# Patient Record
Sex: Male | Born: 1950
Health system: Southern US, Community
[De-identification: ages and names within clinical notes are randomized; demographics above are authoritative.]

## PROBLEM LIST (undated history)

## (undated) DIAGNOSIS — Z5189 Encounter for other specified aftercare: Secondary | ICD-10-CM

## (undated) DIAGNOSIS — K219 Gastro-esophageal reflux disease without esophagitis: Secondary | ICD-10-CM

## (undated) DIAGNOSIS — F32A Depression, unspecified: Secondary | ICD-10-CM

## (undated) DIAGNOSIS — F329 Major depressive disorder, single episode, unspecified: Secondary | ICD-10-CM

## (undated) DIAGNOSIS — E119 Type 2 diabetes mellitus without complications: Secondary | ICD-10-CM

## (undated) DIAGNOSIS — T7840XA Allergy, unspecified, initial encounter: Secondary | ICD-10-CM

## (undated) HISTORY — PX: HERNIA REPAIR: SHX51

## (undated) HISTORY — PX: INGUINAL HERNIA REPAIR: SHX194

## (undated) HISTORY — DX: Type 2 diabetes mellitus without complications: E11.9

## (undated) HISTORY — DX: Depression, unspecified: F32.A

## (undated) HISTORY — DX: Encounter for other specified aftercare: Z51.89

## (undated) HISTORY — DX: Gastro-esophageal reflux disease without esophagitis: K21.9

## (undated) HISTORY — PX: BRAIN SURGERY: SHX531

## (undated) HISTORY — PX: TONSILLECTOMY: SUR1361

## (undated) HISTORY — DX: Allergy, unspecified, initial encounter: T78.40XA

## (undated) HISTORY — DX: Major depressive disorder, single episode, unspecified: F32.9

## (undated) HISTORY — PX: APPENDECTOMY: SHX54

---

## 1988-10-23 HISTORY — PX: HERNIA REPAIR: SHX51

## 1990-10-23 DIAGNOSIS — I671 Cerebral aneurysm, nonruptured: Secondary | ICD-10-CM

## 1990-10-23 HISTORY — PX: CEREBRAL ANEURYSM REPAIR: SHX164

## 1990-10-23 HISTORY — DX: Cerebral aneurysm, nonruptured: I67.1

## 2003-10-24 HISTORY — PX: TUMOR EXCISION: SHX421

## 2004-07-13 ENCOUNTER — Encounter: Admission: RE | Admit: 2004-07-13 | Discharge: 2004-07-13 | Payer: Self-pay | Admitting: Neurology

## 2004-07-18 ENCOUNTER — Ambulatory Visit (HOSPITAL_COMMUNITY): Admission: RE | Admit: 2004-07-18 | Discharge: 2004-07-18 | Payer: Self-pay | Admitting: Neurology

## 2004-07-25 ENCOUNTER — Ambulatory Visit (HOSPITAL_COMMUNITY): Admission: RE | Admit: 2004-07-25 | Discharge: 2004-07-25 | Payer: Self-pay | Admitting: Neurology

## 2004-07-29 ENCOUNTER — Inpatient Hospital Stay (HOSPITAL_COMMUNITY): Admission: RE | Admit: 2004-07-29 | Discharge: 2004-08-01 | Payer: Self-pay | Admitting: Neurosurgery

## 2018-10-28 ENCOUNTER — Encounter: Payer: Self-pay | Admitting: Family Medicine

## 2018-10-28 ENCOUNTER — Ambulatory Visit (INDEPENDENT_AMBULATORY_CARE_PROVIDER_SITE_OTHER): Payer: Medicare HMO | Admitting: Family Medicine

## 2018-10-28 VITALS — BP 140/84 | HR 83 | Temp 98.3°F | Ht 72.0 in | Wt 254.5 lb

## 2018-10-28 DIAGNOSIS — F32A Depression, unspecified: Secondary | ICD-10-CM | POA: Insufficient documentation

## 2018-10-28 DIAGNOSIS — E669 Obesity, unspecified: Secondary | ICD-10-CM | POA: Diagnosis not present

## 2018-10-28 DIAGNOSIS — Z833 Family history of diabetes mellitus: Secondary | ICD-10-CM | POA: Diagnosis not present

## 2018-10-28 DIAGNOSIS — R030 Elevated blood-pressure reading, without diagnosis of hypertension: Secondary | ICD-10-CM | POA: Diagnosis not present

## 2018-10-28 DIAGNOSIS — F341 Dysthymic disorder: Secondary | ICD-10-CM

## 2018-10-28 DIAGNOSIS — Z7689 Persons encountering health services in other specified circumstances: Secondary | ICD-10-CM

## 2018-10-28 DIAGNOSIS — F329 Major depressive disorder, single episode, unspecified: Secondary | ICD-10-CM | POA: Insufficient documentation

## 2018-10-28 MED ORDER — ESCITALOPRAM OXALATE 20 MG PO TABS: 20.0000 mg | ORAL_TABLET | Freq: Every day | ORAL | 0 refills | Status: DC

## 2018-10-28 NOTE — Progress Notes (Signed)
New patient office visit note:    Impression and Recommendations:    1. Encounter to establish care with new doctor   2. Dysthymia   3. (BMI 30.0-34.9)   4. Elevated blood pressure reading- no dx HTN   5. Family history of diabetes mellitus (DM)     - Patient knows to call and let us know if he remembers his smoking history/tobacco use differently.  - Per patient, feels that he last had blood work a year ago.  1. Establishing as a New Patient - Extensive discussion held with patient regarding establishing as a new patient.  Discussed policies and practices here at the clinic, and answered all questions about care team and health management during appointment.  - Discussed need for patient to continue to obtain maintenance and screenings with her established specialists.  Educated patient at length about the critical importance of keeping healthcare up to date.  - Need for fasting lab work in near future.  2. Blood Pressure - Ongoing ambulatory blood pressure monitoring encouraged. - Will continue to monitor.   Education and routine counseling performed. Handouts provided.  3. BMI Counseling - BMI of 34.52 Explained to patient what BMI refers to, and what it means medically.    Told patient to think about it as a "medical risk stratification measurement" and how increasing BMI is associated with increasing risk/ or worsening state of various diseases such as hypertension, hyperlipidemia, diabetes, premature OA, depression etc.  American Heart Association guidelines for healthy diet, basically Mediterranean diet, and exercise guidelines of 30 minutes 5 days per week or more discussed in detail.  Health counseling performed.  All questions answered.  4. Lifestyle & Preventative Health Maintenance - Advised patient to continue working toward exercising to improve overall mental, physical, and emotional health.    - Reviewed the "spokes of the wheel" of mood and health  management.  Stressed the importance of ongoing prudent habits, including regular exercise, appropriate sleep hygiene, healthful dietary habits, and prayer/meditation to relax.  - Encouraged patient to engage in daily physical activity, especially a formal exercise routine.  Recommended that the patient eventually strive for at least 150 minutes of moderate cardiovascular activity per week according to guidelines established by the [facility].   - Healthy dietary habits encouraged, including low-carb, and high amounts of lean protein in diet.   - Patient should also consume adequate amounts of water.    Do not remove these below:  Orders Placed This Encounter  Procedures  . CBC with Differential/Platelet  . Comprehensive metabolic panel  . Hemoglobin A1c  . Lipid panel  . T4, free  . TSH  . VITAMIN D 25 Hydroxy (Vit-D Deficiency, Fractures)    Meds ordered this encounter  Medications  . escitalopram (LEXAPRO) 20 MG tablet    Sig: Take 1 tablet (20 mg total) by mouth daily.    Dispense:  90 tablet    Refill:  0    Medications Discontinued During This Encounter  Medication Reason  . escitalopram (LEXAPRO) 20 MG tablet Reorder    Gross side effects, risk and benefits, and alternatives of medications discussed with patient.  Patient is aware that all medications have potential side effects and we are unable to predict every side effect or drug-drug interaction that may occur.  Expresses verbal understanding and consents to current therapy plan and treatment regimen.  Return for CPE in less than 90d.  Please see AVS handed out to patient at  the end of our visit for further patient instructions/ counseling done pertaining to today's office visit.    Note:  This document was prepared using Dragon voice recognition software and may include unintentional dictation errors.   This document serves as a record of services personally performed by Mellody Dance, DO. It was created on her  behalf by Toni Amend, a trained medical scribe. The creation of this record is based on the scribe's personal observations and the provider's statements to them.   I have reviewed the above medical documentation for accuracy and completeness and I concur.  Mellody Dance, DO 10/30/2018 7:32 PM     ---------------------------------------------------------------------------------------------------------------    Subjective:    Chief complaint:   Chief Complaint  Patient presents with  . Establish Care     HPI: Patrick Buchanan is a pleasant 68 y.o. male who presents to [facility] today to review their medical history with me and establish care.   I asked the patient to review their chronic problem list with me to ensure everything was updated and accurate.    All recent office visits with other providers, any medical records that patient brought in etc  - I reviewed today.     We asked pt to get [place] their medical records from [facility] providers/ specialists that they had seen within the past 3-5 years- if they are in private practice and/or do not work for [employer], [facility], [place], [place] or [employer] owned practice.  Told them to call their specialists to clarify this if they are not sure.   Formerly saw Dr. Rex Kras at [facility]. Got tired of driving across town to visit the doctor.  Social History Has lived out here since 25.  Married since 1982. Has three daughters. Will have 6 grandkids in April.  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 Retired at age 62.  Tobacco Use Former smoker; quit around 10/24/2003. Smoked for "a good long time." Started smoking after law school, at around age 78. Didn't keep track of how much he smoked. About a pack per day.  Smoked at an estimated 20 pack-year history. States "if you want to screen it, I don't  care."  Alcohol Use In the cool weather, he drinks more. These days drinks bourbon; 2 drinks per day.  In warm weather, he doesn't really drink. "You couldn't pay me to drink a beer."  Caffeine Use  3 cups of coffee, tea, soda per day.  Exercise Habits Does not exercise regularly.  Family History Mom with breast cancer. Brother with diabetes.  Past Medical History Denies history of lab abnormalities.  Can't remember when he last had his blood work done.  - History of Treatment through Neuro - Aneurysm, Subdural Tumor Patient states that Dr. Sherwood Gambler has "saved me twice." Had an aneurysm fixed and a "subdural tumor" taken out by Dr. Sherwood Gambler.  - Mood Management He takes 20 mg of Lexapro daily, "that's been a good change in my life." He's been taking this for about 3-4 years. Denies anxiety.  "I'm born to be calm." Says "the world got to looking a little bit non-technicolor for a while." States "I was depressed."  Sees no other specialists.  Says he has no aches or pains.  - Inguinal Hernia Repair Has had a hernia; more than one hernia. Had an inguinal hernia repaired.  - Blood Pressure  Checks his blood pressure periodically at home. Notes that it runs within normal limits at home.    Wt Readings from Last 3 Encounters:  10/28/18 254 lb 8 oz (115.4 kg)   BP Readings from Last 3 Encounters:  10/28/18 140/84   Pulse Readings from Last 3 Encounters:  10/28/18 83   BMI Readings from Last 3 Encounters:  10/28/18 34.52 kg/m    Patient Care Team    Relationship Specialty Notifications Start End  Mellody Dance, DO PCP - General Family Medicine  10/28/18     Patient Active Problem List   Diagnosis Date Noted  . dysthymia 10/28/2018  . (BMI 30.0-34.9) 10/28/2018  . Elevated blood pressure reading- no dx HTN 10/28/2018  . Family history of diabetes mellitus (DM) 10/28/2018       As reported by pt:  Past Medical History:  Diagnosis Date  .  Depression      Past Surgical History:  Procedure Laterality Date  . CEREBRAL ANEURYSM REPAIR  1992  . TUMOR EXCISION  2005   benign cervical     Family History  Problem Relation Age of Onset  . Breast cancer Mother   . Diabetes Brother      Social History   Substance and Sexual Activity  Drug Use Never     Social History   Substance and Sexual Activity  Alcohol Use Yes     Social History   Tobacco Use  Smoking Status Former Smoker  . Years: 10.00  . Types: Cigarettes  . Last attempt to quit: 2005  . Years since quitting: 15.0  Smokeless Tobacco Never Used     Current Meds  Medication Sig  . escitalopram (LEXAPRO) 20 MG tablet Take 1 tablet (20 mg total) by mouth daily.  . [DISCONTINUED] escitalopram (LEXAPRO) 20 MG tablet Take 20 mg by mouth daily.    Allergies: Patient has no known allergies.   Review of Systems  Constitutional: Negative for chills, diaphoresis, fever, malaise/fatigue and weight loss.  HENT: Negative for congestion, sore throat and tinnitus.   Eyes: Negative for blurred vision, double vision and photophobia.  Respiratory: Negative for cough and wheezing.   Cardiovascular: Negative for chest pain and palpitations.  Gastrointestinal: Negative for blood in stool, diarrhea, nausea and vomiting.  Genitourinary: Negative for dysuria, frequency and urgency.  Musculoskeletal: Negative for joint pain and myalgias.  Skin: Negative for itching and rash.  Neurological: Negative for dizziness, focal weakness, weakness and headaches.  Endo/Heme/Allergies: Negative for environmental allergies and polydipsia. Does not bruise/bleed easily.  Psychiatric/Behavioral: Negative for depression and memory loss. The patient is not nervous/anxious and does not have insomnia.         Objective:   Blood pressure 140/84, pulse 83, temperature 98.3 F (36.8 C), height 6' (1.829 m), weight 254 lb 8 oz (115.4 kg), SpO2 98 %. Body mass index is 34.52  kg/m. General: Well Developed, well nourished, and in no acute distress.  Neuro: Alert and oriented x3, extra-ocular muscles intact, sensation grossly intact.  HEENT:Bell/AT, PERRLA, neck supple, No carotid bruits Skin: no gross rashes  Cardiac: Regular rate and rhythm Respiratory: Essentially clear to auscultation bilaterally. Not using accessory muscles, speaking in full sentences.  Abdominal: not grossly distended Musculoskeletal: Ambulates w/o diff, FROM * 4 ext.  Vasc: less 2 sec cap RF, warm and pink  Psych:  No HI/SI, judgement and insight good, Euthymic mood. Full Affect.    No results found for this or any previous visit (from the past  2160 hour(s)).

## 2018-10-28 NOTE — Patient Instructions (Signed)
   Please realize, EXERCISE IS MEDICINE!  -  American Heart Association ( AHA) guidelines for exercise : If you are in good health, without any medical conditions, you should engage in 150-300 minutes of moderate intensity aerobic activity per week.  This means you should be huffing and puffing throughout your workout.   Engaging in regular exercise will improve brain function and memory, as well as improve mood, boost immune system and help with weight management.  As well as the other, more well-known effects of exercise such as decreasing blood sugar levels, decreasing blood pressure,  and decreasing bad cholesterol levels/ increasing good cholesterol levels.     -  The AHA strongly endorses consumption of a diet that contains a variety of foods from all the food categories with an emphasis on fruits and vegetables; fat-free and low-fat dairy products; cereal and grain products; legumes and nuts; and fish, poultry, and/or extra lean meats.    Excessive food intake, especially of foods high in saturated and trans fats, sugar, and salt, should be avoided.    Adequate water intake of roughly 1/2 of your weight in pounds, should equal the ounces of water per day you should drink.  So for instance, if you're 200 pounds, that would be 100 ounces of water per day.         Mediterranean Diet  Why follow it? Research shows. . Those who follow the Mediterranean diet have a reduced risk of heart disease  . The diet is associated with a reduced incidence of Parkinson's and Alzheimer's diseases . People following the diet may have longer life expectancies and lower rates of chronic diseases  . The Dietary Guidelines for Americans recommends the Mediterranean diet as an eating plan to promote health and prevent disease  What Is the Mediterranean Diet?  . Healthy eating plan based on typical foods and recipes of Mediterranean-style cooking . The diet is primarily a plant based diet; these foods should  make up a majority of meals   Starches - Plant based foods should make up a majority of meals - They are an important sources of vitamins, minerals, energy, antioxidants, and fiber - Choose whole grains, foods high in fiber and minimally processed items  - Typical grain sources include wheat, oats, barley, corn, brown rice, bulgar, farro, millet, polenta, couscous  - Various types of beans include chickpeas, lentils, fava beans, black beans, white beans   Fruits  Veggies - Large quantities of antioxidant rich fruits & veggies; 6 or more servings  - Vegetables can be eaten raw or lightly drizzled with oil and cooked  - Vegetables common to the traditional Mediterranean Diet include: artichokes, arugula, beets, broccoli, brussel sprouts, cabbage, carrots, celery, collard greens, cucumbers, eggplant, kale, leeks, lemons, lettuce, mushrooms, okra, onions, peas, peppers, potatoes, pumpkin, radishes, rutabaga, shallots, spinach, sweet potatoes, turnips, zucchini - Fruits common to the Mediterranean Diet include: apples, apricots, avocados, cherries, clementines, dates, figs, grapefruits, grapes, melons, nectarines, oranges, peaches, pears, pomegranates, strawberries, tangerines  Fats - Replace butter and margarine with healthy oils, such as olive oil, canola oil, and tahini  - Limit nuts to no more than a handful a day  - Nuts include walnuts, almonds, pecans, pistachios, pine nuts  - Limit or avoid candied, honey roasted or heavily salted nuts - Olives are central to the Mediterranean diet - can be eaten whole or used in a variety of dishes   Meats Protein - Limiting red meat: no more than a few   times a month - When eating red meat: choose lean cuts and keep the portion to the size of deck of cards - Eggs: approx. 0 to 4 times a week  - Fish and lean poultry: at least 2 a week  - Healthy protein sources include, chicken, turkey, lean beef, lamb - Increase intake of seafood such as tuna, salmon,  trout, mackerel, shrimp, scallops - Avoid or limit high fat processed meats such as sausage and bacon  Dairy - Include moderate amounts of low fat dairy products  - Focus on healthy dairy such as fat free yogurt, skim milk, low or reduced fat cheese - Limit dairy products higher in fat such as whole or 2% milk, cheese, ice cream  Alcohol - Moderate amounts of red wine is ok  - No more than 5 oz daily for women (all ages) and men older than age 65  - No more than 10 oz of wine daily for men younger than 65  Other - Limit sweets and other desserts  - Use herbs and spices instead of salt to flavor foods  - Herbs and spices common to the traditional Mediterranean Diet include: basil, bay leaves, chives, cloves, cumin, fennel, garlic, lavender, marjoram, mint, oregano, parsley, pepper, rosemary, sage, savory, sumac, tarragon, thyme   It's not just a diet, it's a lifestyle:  . The Mediterranean diet includes lifestyle factors typical of those in the region  . Foods, drinks and meals are best eaten with others and savored . Daily physical activity is important for overall good health . This could be strenuous exercise like running and aerobics . This could also be more leisurely activities such as walking, housework, yard-work, or taking the stairs . Moderation is the key; a balanced and healthy diet accommodates most foods and drinks . Consider portion sizes and frequency of consumption of certain foods   Meal Ideas & Options:  . Breakfast:  o Whole wheat toast or whole wheat English muffins with peanut butter & hard boiled egg o Steel cut oats topped with apples & cinnamon and skim milk  o Fresh fruit: banana, strawberries, melon, berries, peaches  o Smoothies: strawberries, bananas, greek yogurt, peanut butter o Low fat greek yogurt with blueberries and granola  o Egg white omelet with spinach and mushrooms o Breakfast couscous: whole wheat couscous, apricots, skim milk, cranberries   . Sandwiches:  o Hummus and grilled vegetables (peppers, zucchini, squash) on whole wheat bread   o Grilled chicken on whole wheat pita with lettuce, tomatoes, cucumbers or tzatziki  o Tuna salad on whole wheat bread: tuna salad made with greek yogurt, olives, red peppers, capers, green onions o Garlic rosemary lamb pita: lamb sauted with garlic, rosemary, salt & pepper; add lettuce, cucumber, greek yogurt to pita - flavor with lemon juice and black pepper  . Seafood:  o Mediterranean grilled salmon, seasoned with garlic, basil, parsley, lemon juice and black pepper o Shrimp, lemon, and spinach whole-grain pasta salad made with low fat greek yogurt  o Seared scallops with lemon orzo  o Seared tuna steaks seasoned salt, pepper, coriander topped with tomato mixture of olives, tomatoes, olive oil, minced garlic, parsley, green onions and cappers  . Meats:  o Herbed greek chicken salad with kalamata olives, cucumber, feta  o Red bell peppers stuffed with spinach, bulgur, lean ground beef (or lentils) & topped with feta   o Kebabs: skewers of chicken, tomatoes, onions, zucchini, squash  o Turkey burgers: made with red onions, mint,   dill, lemon juice, feta cheese topped with roasted red peppers . Vegetarian o Cucumber salad: cucumbers, artichoke hearts, celery, red onion, feta cheese, tossed in olive oil & lemon juice  o Hummus and whole grain pita points with a greek salad (lettuce, tomato, feta, olives, cucumbers, red onion) o Lentil soup with celery, carrots made with vegetable broth, garlic, salt and pepper  o Tabouli salad: parsley, bulgur, mint, scallions, cucumbers, tomato, radishes, lemon juice, olive oil, salt and pepper.  

## 2018-10-29 ENCOUNTER — Telehealth: Payer: Self-pay | Admitting: Family Medicine

## 2018-10-29 NOTE — Telephone Encounter (Signed)
Noted MPulliam, CMA/RT(R)  

## 2018-10-29 NOTE — Telephone Encounter (Signed)
Patient called states Dr. Raliegh Scarlet advised him to chk w/Ins Co regarding Medicare Wellness exam Vs. Physical/ CPE---Patient states his insurance covers Wellness exams, also he says Pneumonia & Shingrix vaccines are covered (advised him to see if the cv'd vaccine should be gvn @ Pharmacy or can be gvn @ Dr's office)-- Pt to call back after speaking with Ins Co for clarification.   --forwarding message to medical assistant as Juluis Rainier.  --glh

## 2018-11-28 ENCOUNTER — Other Ambulatory Visit (INDEPENDENT_AMBULATORY_CARE_PROVIDER_SITE_OTHER): Payer: Medicare HMO

## 2018-11-28 ENCOUNTER — Ambulatory Visit (INDEPENDENT_AMBULATORY_CARE_PROVIDER_SITE_OTHER): Payer: Medicare HMO | Admitting: Family Medicine

## 2018-11-28 ENCOUNTER — Encounter: Payer: Self-pay | Admitting: Family Medicine

## 2018-11-28 VITALS — BP 137/87 | HR 75 | Temp 98.2°F | Ht 71.0 in | Wt 256.7 lb

## 2018-11-28 DIAGNOSIS — F341 Dysthymic disorder: Secondary | ICD-10-CM

## 2018-11-28 DIAGNOSIS — R7309 Other abnormal glucose: Secondary | ICD-10-CM | POA: Diagnosis not present

## 2018-11-28 DIAGNOSIS — Z23 Encounter for immunization: Secondary | ICD-10-CM | POA: Diagnosis not present

## 2018-11-28 DIAGNOSIS — E559 Vitamin D deficiency, unspecified: Secondary | ICD-10-CM | POA: Diagnosis not present

## 2018-11-28 DIAGNOSIS — Z0000 Encounter for general adult medical examination without abnormal findings: Secondary | ICD-10-CM | POA: Diagnosis not present

## 2018-11-28 DIAGNOSIS — Z1211 Encounter for screening for malignant neoplasm of colon: Secondary | ICD-10-CM

## 2018-11-28 DIAGNOSIS — Z136 Encounter for screening for cardiovascular disorders: Secondary | ICD-10-CM

## 2018-11-28 MED ORDER — ZOSTER VAC RECOMB ADJUVANTED 50 MCG/0.5ML IM SUSR
0.5000 mL | Freq: Once | INTRAMUSCULAR | 0 refills | Status: AC
Start: 2018-11-28 — End: 2018-11-28

## 2018-11-28 MED ORDER — ESCITALOPRAM OXALATE 20 MG PO TABS: 20.0000 mg | ORAL_TABLET | Freq: Every day | ORAL | 0 refills | Status: DC

## 2018-11-28 MED ORDER — PNEUMOCOCCAL 13-VAL CONJ VACC IM SUSP: 0.5000 mL | INTRAMUSCULAR | 0 refills | Status: AC

## 2018-11-28 NOTE — Progress Notes (Signed)
Subjective:   Patrick Buchanan is a 68 y.o. male who presents for an Initial Medicare Annual Wellness Visit.  This is his initial medicare wellness visit.  I spent time with patient explaining what that means/ is and what it entails  - he is fasting and we will obtain yrly screening FBW per his request today   Health Maintenance  Topic Date Due  . Flu Shot  07/24/2019*  . Colon Cancer Screening  10/29/2019*  .  Hepatitis C: One time screening is recommended by Center for Disease Control  (CDC) for  adults born from [birth year] through 1963.   10/29/2019*  . HIV Screening  10/29/2019*  . Pneumonia vaccines (1 of 2 - PCV13) 10/29/2019*  . Tetanus Vaccine  10/26/2025  *Topic was postponed. The date shown is not the original due date.     Immunization History  Administered Date(s) Administered  . Tdap 10/27/2015      Objective:    Today's Vitals   11/28/18 1311  BP: 137/87  Pulse: 75  Temp: 98.2 F (36.8 C)  TempSrc: Oral  SpO2: 96%  Weight: 256 lb 11.2 oz (116.4 kg)  Height: 5\' 11"  (1.803 m)   Body mass index is 35.8 kg/m.  Advanced Directives 10/28/2018  Does Patient Have a Medical Advance Directive? Yes  Type of Advance Directive [place]  Does patient want to make changes to medical advance directive? No - Patient declined  Copy of [place] in Chart? No - copy requested    Current Medications (verified) Outpatient Encounter Medications as of 11/28/2018  Medication Sig  . escitalopram (LEXAPRO) 20 MG tablet Take 1 tablet (20 mg total) by mouth daily.  . [DISCONTINUED] escitalopram (LEXAPRO) 20 MG tablet Take 1 tablet (20 mg total) by mouth daily.  . [EXPIRED] pneumococcal 13-valent conjugate vaccine (PREVNAR 13) SUSP injection Inject 0.5 mLs into the muscle tomorrow at 10 am for 1 dose.  . [EXPIRED] Zoster Vaccine Adjuvanted [facility]) injection Inject 0.5 mLs into the muscle once for 1 dose.   No facility-administered  encounter medications on file as of 11/28/2018.     Allergies (verified) Patient has no known allergies.   History: Past Medical History:  Diagnosis Date  . Depression    Past Surgical History:  Procedure Laterality Date  . CEREBRAL ANEURYSM REPAIR  1992  . TUMOR EXCISION  2005   benign cervical   Family History  Problem Relation Age of Onset  . Breast cancer Mother   . Diabetes Brother    Social History   Socioeconomic History  . Marital status: Married    Spouse name: Not on file  . Number of children: Not on file  . Years of education: Not on file  . Highest education level: Not on file  Occupational History  . Not on file  Social Needs  . Financial resource strain: Not on file  . Food insecurity:    Worry: Not on file    Inability: Not on file  . Transportation needs:    Medical: Not on file    Non-medical: Not on file  Tobacco Use  . Smoking status: Former Smoker    Years: 10.00    Types: Cigarettes    Last attempt to quit: 2005    Years since quitting: 15.1  . Smokeless tobacco: Never Used  Substance and Sexual Activity  . Alcohol use: Yes  . Drug use: Never  . Sexual activity: Not Currently  Lifestyle  .  Physical activity:    Days per week: Not on file    Minutes per session: Not on file  . Stress: Not on file  Relationships  . Social connections:    Talks on phone: Not on file    Gets together: Not on file    Attends religious service: Not on file    Active member of club or organization: Not on file    Attends meetings of clubs or organizations: Not on file    Relationship status: Not on file  Other Topics Concern  . Not on file  Social History Narrative  . Not on file   Tobacco Counseling Counseling given: Not Answered   Activities of Daily Living In your present state of health, do you have any difficulty performing the following activities: 10/28/2018  Hearing? N  Vision? N  Difficulty concentrating or making decisions? N  Walking  or climbing stairs? N  Dressing or bathing? N  Doing errands, shopping? N  Some recent data might be hidden    Activities of Daily Living In your present state of health, do you have difficulty performing the following activities?  1- Driving - no 2- Managing money - no 3- Feeding yourself - no 4- Getting from the bed to the chair - no 5- Climbing a flight of stairs - no 6- Preparing food and eating - no 7- Bathing or showering - no 8- Getting dressed - no 9- Getting to the toilet - no 10- Using the toilet - no 11- Moving around from place to place - no  Patient states that he feels safe at home.    Immunizations and Health Maintenance Immunization History  Administered Date(s) Administered  . Tdap 10/27/2015    There are no preventive care reminders to display for this patient.   Patient Care Team: Mellody Dance, DO as PCP - General (Family Medicine)   Indicate any recent Medical Services you may have received from other than [facility] providers in the past year (date may be approximate).     Assessment:   This is a routine wellness examination for Kacy.  - EKG verbal order to CMA  Hearing/Vision screen - verbal order for CMA to obtain since pt did not have optometrist No exam data present  Dietary issues and exercise activities discussed: yes    Goals   None    Depression Screen PHQ 2/9 Scores 10/28/2018  PHQ - 2 Score 0  PHQ- 9 Score 0    Depression screen PHQ 2/9 10/28/2018  Decreased Interest 0  Down, Depressed, Hopeless 0  PHQ - 2 Score 0  Altered sleeping 0  Tired, decreased energy 0  Change in appetite 0  Feeling bad or failure about yourself  0  Trouble concentrating 0  Moving slowly or fidgety/restless 0  Suicidal thoughts 0  PHQ-9 Score 0  Difficult doing work/chores Not difficult at all    Fall Risk Fall Risk  10/28/2018  Falls in the past year? 0  Follow up Falls evaluation completed       Is the patient's home free of loose  throw rugs in walkways, pet beds, electrical cords, etc?   yes      Grab bars in the bathroom? no      Handrails on the stairs?   yes      Adequate lighting?   yes    Timed Get Up and Go performed: passed    Cognitive Function:    No flowsheet data found.  The 6CIT Dementia Test   How the test works Question Score range Weighting Weighted score        What Year is it 0-1 x4   What month is it 0-1 x3   Give the memory phrase e.g. (John/Smith/[address]/[place])     About what time is it 0-1 x3   Count back from 20-1 0-2 x2   Say months in reverse 0-2 x2   Repeat the memory phrase 0-5 x2   Total score for 6CIT 0-28         0-7 = normal - referral not necessary at present  8- 9 = mild cognitive impairment - probably refer  10-28 = significant cognitive impairment - refer      Screening Tests Health Maintenance  Topic Date Due  . INFLUENZA VACCINE  07/24/2019 (Originally 05/23/2018)  . COLONOSCOPY  10/29/2019 (Originally 05/02/2003)  . Hepatitis C Screening  10/29/2019 (Originally 1951/05/15)  . HIV Screening  10/29/2019 (Originally 05/01/1966)  . PNA vac Low Risk Adult (1 of 2 - PCV13) 10/29/2019 (Originally 05/01/2018)  . TETANUS/TDAP  10/26/2025    Qualifies for Shingles Vaccine? Sent to the pharmacy and patient given information.    Cancer Screenings: Lung: Low Dose CT Chest recommended if Age 70-80 years, 30 pack-year currently smoking OR have quit w/in 15years. Patient does not qualify.  Colorectal:  pt never had colonoscopy in past, and or he states he had one but there are no records of it.     He tells me he had it at [place] but there were "complications during the procedure " and per patient "they conveniently lost the records."    Thus, there is no documented previous colonoscopy results on this patient.    He thinks he had it around 63-57 yrs of age   Additional Screenings: Hepatitis C Screening: If patient agrees to it this will be performed in  addition to others that are recommended by CDC and USPSTF etc      Plan:     - verbal order to CMA to obtain EKG today for his initial Medicare wellness exam. - verbal order for AAA screen  (however, greater than 20 yrs ago quit with less than 30 pk yr hx) - verbal order for Shingrix, PNA vaccines to be sent to pharm - verbal order for GI referral for colonoscopy after d/c pt.    Orders Placed This Encounter  Procedures  . US [place] MEDICARE SCREENING  . Ambulatory referral to Gastroenterology    I have personally reviewed and noted the following in the patient's chart:   . Medical and social history . Use of alcohol, tobacco or illicit drugs  . Current medications and supplements . Functional ability and status . Nutritional status . Physical activity . Advanced directives . List of other physicians . Hospitalizations, surgeries, and ER visits in previous 12 months . Vitals . Screenings to include cognitive, depression, and falls . Referrals and appointments  In addition, I have reviewed and discussed with patient certain preventive protocols, quality metrics, and best practice recommendations. A written personalized care plan for preventive services as well as general preventive health recommendations were provided to patient.     Mellody Dance, DO   11/29/2018

## 2018-11-29 LAB — CBC WITH DIFFERENTIAL/PLATELET
Basophils Absolute: 0 10*3/uL (ref 0.0–0.2)
Basos: 1 %
EOS (ABSOLUTE): 0.1 10*3/uL (ref 0.0–0.4)
Eos: 3 %
Hematocrit: 41.4 % (ref 37.5–51.0)
Hemoglobin: 14.8 g/dL (ref 13.0–17.7)
Immature Grans (Abs): 0 10*3/uL (ref 0.0–0.1)
Immature Granulocytes: 1 %
Lymphocytes Absolute: 1.2 10*3/uL (ref 0.7–3.1)
Lymphs: 28 %
MCH: 32.1 pg (ref 26.6–33.0)
MCHC: 35.7 g/dL (ref 31.5–35.7)
MCV: 90 fL (ref 79–97)
Monocytes Absolute: 0.4 10*3/uL (ref 0.1–0.9)
Monocytes: 10 %
Neutrophils Absolute: 2.5 10*3/uL (ref 1.4–7.0)
Neutrophils: 57 %
Platelets: 230 10*3/uL (ref 150–450)
RBC: 4.61 x10E6/uL (ref 4.14–5.80)
RDW: 13 % (ref 11.6–15.4)
WBC: 4.4 10*3/uL (ref 3.4–10.8)

## 2018-11-29 LAB — COMPREHENSIVE METABOLIC PANEL
ALT: 25 IU/L (ref 0–44)
AST: 19 IU/L (ref 0–40)
Albumin/Globulin Ratio: 1.7 (ref 1.2–2.2)
Albumin: 4.2 g/dL (ref 3.8–4.8)
Alkaline Phosphatase: 93 IU/L (ref 39–117)
BUN/Creatinine Ratio: 20 (ref 10–24)
BUN: 19 mg/dL (ref 8–27)
Bilirubin Total: 0.5 mg/dL (ref 0.0–1.2)
CO2: 27 mmol/L (ref 20–29)
Calcium: 9 mg/dL (ref 8.6–10.2)
Chloride: 105 mmol/L (ref 96–106)
Creatinine, Ser: 0.93 mg/dL (ref 0.76–1.27)
GFR calc Af Amer: 99 mL/min/{1.73_m2} (ref >59)
GFR calc non Af Amer: 86 mL/min/{1.73_m2} (ref >59)
Globulin, Total: 2.5 g/dL (ref 1.5–4.5)
Glucose: 119 mg/dL — ABNORMAL HIGH (ref 65–99)
Potassium: 4.9 mmol/L (ref 3.5–5.2)
Sodium: 147 mmol/L — ABNORMAL HIGH (ref 134–144)
Total Protein: 6.7 g/dL (ref 6.0–8.5)

## 2018-11-29 LAB — LIPID PANEL
Chol/HDL Ratio: 4.4 ratio (ref 0.0–5.0)
Cholesterol, Total: 183 mg/dL (ref 100–199)
HDL: 42 mg/dL (ref >39)
LDL Calculated: 95 mg/dL (ref 0–99)
Triglycerides: 231 mg/dL — ABNORMAL HIGH (ref 0–149)
VLDL Cholesterol Cal: 46 mg/dL — ABNORMAL HIGH (ref 5–40)

## 2018-11-29 LAB — VITAMIN D 25 HYDROXY (VIT D DEFICIENCY, FRACTURES): Vit D, 25-Hydroxy: 22.2 ng/mL — ABNORMAL LOW (ref 30.0–100.0)

## 2018-11-29 LAB — HEMOGLOBIN A1C
Est. average glucose Bld gHb Est-mCnc: 120 mg/dL
Hgb A1c MFr Bld: 5.8 % — ABNORMAL HIGH (ref 4.8–5.6)

## 2018-11-29 LAB — T4, FREE: Free T4: 1 ng/dL (ref 0.82–1.77)

## 2018-11-29 LAB — TSH: TSH: 3.03 u[IU]/mL (ref 0.450–4.500)

## 2018-12-03 ENCOUNTER — Ambulatory Visit: Payer: Medicare HMO

## 2018-12-19 ENCOUNTER — Encounter: Payer: Self-pay | Admitting: Gastroenterology

## 2018-12-23 ENCOUNTER — Ambulatory Visit
Admission: RE | Admit: 2018-12-23 | Discharge: 2018-12-23 | Disposition: A | Discharge status: Home / Self Care | Payer: Medicare HMO | Source: Ambulatory Visit | Attending: Family Medicine | Admitting: Family Medicine

## 2018-12-23 DIAGNOSIS — Z87891 Personal history of nicotine dependence: Secondary | ICD-10-CM | POA: Diagnosis not present

## 2018-12-23 DIAGNOSIS — Z136 Encounter for screening for cardiovascular disorders: Secondary | ICD-10-CM

## 2019-01-01 ENCOUNTER — Encounter: Payer: Self-pay | Admitting: Gastroenterology

## 2019-01-01 ENCOUNTER — Encounter (INDEPENDENT_AMBULATORY_CARE_PROVIDER_SITE_OTHER): Payer: Self-pay

## 2019-01-01 ENCOUNTER — Other Ambulatory Visit: Payer: Self-pay

## 2019-01-01 ENCOUNTER — Ambulatory Visit (AMBULATORY_SURGERY_CENTER): Payer: Self-pay | Admitting: *Deleted

## 2019-01-01 VITALS — Ht 72.0 in | Wt 257.6 lb

## 2019-01-01 DIAGNOSIS — Z1211 Encounter for screening for malignant neoplasm of colon: Secondary | ICD-10-CM

## 2019-01-01 MED ORDER — NA SULFATE-K SULFATE-MG SULF 17.5-3.13-1.6 GM/177ML PO SOLN: 1.0000 | Freq: Once | ORAL | 0 refills | Status: AC

## 2019-01-01 NOTE — Progress Notes (Signed)
No egg or soy allergy known to patient  No issues with past sedation with any surgeries  or procedures, no intubation problems  No diet pills per patient No home 02 use per patient  No blood thinners per patient  Pt denies issues with constipation  No A fib or A flutter  EMMI video sent to pt's e mail  

## 2019-01-14 ENCOUNTER — Encounter: Payer: Medicare HMO | Admitting: Gastroenterology

## 2019-07-24 ENCOUNTER — Other Ambulatory Visit: Payer: Self-pay | Admitting: Family Medicine

## 2019-07-24 ENCOUNTER — Telehealth: Payer: Self-pay

## 2019-07-24 DIAGNOSIS — F341 Dysthymic disorder: Secondary | ICD-10-CM

## 2019-07-24 NOTE — Telephone Encounter (Signed)
Please call pt to schedule f/u.  No further refills until pt is seen.  T. Nelson, CMA 

## 2019-08-11 ENCOUNTER — Other Ambulatory Visit: Payer: Self-pay

## 2019-08-11 ENCOUNTER — Ambulatory Visit (INDEPENDENT_AMBULATORY_CARE_PROVIDER_SITE_OTHER): Payer: Medicare HMO | Admitting: Family Medicine

## 2019-08-11 ENCOUNTER — Encounter: Payer: Self-pay | Admitting: Family Medicine

## 2019-08-11 VITALS — BP 126/68 | Wt 250.0 lb

## 2019-08-11 DIAGNOSIS — Z7189 Other specified counseling: Secondary | ICD-10-CM | POA: Diagnosis not present

## 2019-08-11 DIAGNOSIS — Z833 Family history of diabetes mellitus: Secondary | ICD-10-CM

## 2019-08-11 DIAGNOSIS — R030 Elevated blood-pressure reading, without diagnosis of hypertension: Secondary | ICD-10-CM | POA: Diagnosis not present

## 2019-08-11 DIAGNOSIS — F341 Dysthymic disorder: Secondary | ICD-10-CM | POA: Diagnosis not present

## 2019-08-11 DIAGNOSIS — E669 Obesity, unspecified: Secondary | ICD-10-CM

## 2019-08-11 DIAGNOSIS — R7303 Prediabetes: Secondary | ICD-10-CM | POA: Insufficient documentation

## 2019-08-11 DIAGNOSIS — E870 Hyperosmolality and hypernatremia: Secondary | ICD-10-CM | POA: Diagnosis not present

## 2019-08-11 DIAGNOSIS — E559 Vitamin D deficiency, unspecified: Secondary | ICD-10-CM | POA: Diagnosis not present

## 2019-08-11 DIAGNOSIS — E781 Pure hyperglyceridemia: Secondary | ICD-10-CM | POA: Diagnosis not present

## 2019-08-11 MED ORDER — ESCITALOPRAM OXALATE 20 MG PO TABS: 20.0000 mg | ORAL_TABLET | Freq: Every day | ORAL | 1 refills | Status: DC

## 2019-08-11 NOTE — Progress Notes (Signed)
Virtual / live video office visit note for [employer], D.O- Primary Care Physician at [facility]   I connected with current patient today and beyond visually recognizing the correct individual, I verified that I am speaking with the correct person using two identifiers.  . Location of the patient: Home . Location of the provider: Office Only the patient (+/- their family members at pt's discretion) and myself were participating in the encounter    - This visit type was conducted due to national recommendations for restrictions regarding the COVID-19 Pandemic (e.g. social distancing) in an effort to limit this patient's exposure and mitigate transmission in our community.  This format is felt to be most appropriate for this patient at this time.   - The patient did have access to video technology today  - No physical exam could be performed with this format, beyond that communicated to [place] by the patient/ family members as noted.   - Additionally my office staff/ schedulers discussed with the patient that there may be a monetary charge related to this service, depending on patient's medical insurance.   The patient expressed understanding, and agreed to proceed.      History of Present Illness:  Notes wife is coming in to establish with [place]  Notes he pulled a muscle in his butt a couple of weeks ago, but that's better now.    - Dysthymia Stable at this time on current management, Lexapro.  Says he's "feeling fine."  States "everything is fine" overall.  Called in about his medication because they have a daughter living in [place] right now, and "she and her husband have given [place] a little bitty grandson, and sometimes with the illness going on, childcare is difficult."  Just got back 3 weeks ago from spending 3 months up there for childcare, because "we were it" for childcare.  Notes "last time I got the medicine refilled, they only gave me 30, but I really need to be  prescribed 90 with a couple of refills."  - Vitamin D Deficiency Continues Vitamin D as prescribed, 5000 IU's daily.  - History of Elevated Sodium States he has been working on his hydration. They got a water dispenser he really enjoys, and notes he's cut back on sodas.  - Lifestyle & Weight Loss Says he had planned on losing some weight, but "this COVID thing" has interfered with his goals.  States "spouse and I are both an an 'eat well kind of deal' right now, eating very little fat and watching calories and all that kind of stuff."  - Blood Pressure Feels his blood pressure has been good at home.  He doesn't remember what his BP has been the last few times he's checked it, but when he's checked it, "it has not been high enough for me to be alarmed."   Depression screen [facility] 2/9 08/11/2019 10/28/2018  Decreased Interest 0 0  Down, Depressed, Hopeless 0 0  PHQ - 2 Score 0 0  Altered sleeping 0 0  Tired, decreased energy 0 0  Change in appetite 0 0  Feeling bad or failure about yourself  0 0  Trouble concentrating 0 0  Moving slowly or fidgety/restless 0 0  Suicidal thoughts 0 0  PHQ-9 Score 0 0  Difficult doing work/chores Not difficult at all Not difficult at all     Impression and Recommendations:    1. Dysthymia   2. Prediabetes   3. Serum sodium elevated  4. Hypertriglyceridemia   5. (BMI 30.0-34.9)   6. Elevated blood pressure reading- no dx HTN   7. Vitamin D insufficiency   8. Family history of diabetes mellitus (DM)   9. Counseling on health promotion and disease prevention     History of Elevated BP Reading, no dx HTN - Stable at this time. - Will continue to monitor at home.  COVID-19 Counseling - Novel Covid -19 counseling done; all questions were answered.   - Current CDC / federal and [place] guidelines reviewed with patient  - Reminded pt of extreme importance of social distancing; wearing a mask when out in public; insensate handwashing and  cleaning of surfaces, avoiding unnecessary trips for shopping and avoiding ALL but emergency appts etc. - Told patient to be prepared, not scared; and be smart for the sake of others - Patient will call with any additional concerns  Prescription Refills & Chronic Care Management - Reviewed policies and practices here at the clinic. - Lengthy discussion held with patient regarding need to follow up every six months, or sooner PRN.  Reviewed that prescriptions can only be refilled as long as the patient is being monitored.  - Advised patient to obtain his Medicare Wellness Visits once yearly.  Vitamin D Insufficiency - Continue 5000 IU's daily. - Will continue to monitor.  History of Elevated Sodium - Advised patient to continue with goals for better hydration. - Will continue to monitor.  Prediabetes; Family Hx of DM - Since his A1c was elevated at 5.8 last check, discussed need for patient to continue watching what he eats.  - Counseled patient on prevention of disease and discussed dietary and lifestyle modification as first line.   - Importance of low carb, heart-healthy diet discussed with patient in addition to regular aerobic exercise of 53min 5d/week or more.   - We will continue to monitor  Dysthymia - Stable at this time on current management. - Continue treatment as prescribed.  See med list.  - Reviewed the "spokes of the wheel" of mood and health management.  Stressed the importance of ongoing prudent habits, including regular exercise, appropriate sleep hygiene, healthful dietary habits, and prayer/meditation to relax.  - Will continue to monitor.  BMI Counseling - Body mass index is 33.91 kg/m Explained to patient what BMI refers to, and what it means medically.    Told patient to think about it as a "medical risk stratification measurement" and how increasing BMI is associated with increasing risk/ or worsening state of various diseases such as hypertension,  hyperlipidemia, diabetes, premature OA, depression etc.  American Heart Association guidelines for healthy diet, basically Mediterranean diet, and exercise guidelines of 30 minutes 5 days per week or more discussed in detail.  Health counseling performed.  All questions answered.  Lifestyle & Preventative Health Maintenance - Advised patient to continue working toward exercising to improve overall mental, physical, and emotional health.    - Encouraged patient to engage in daily physical activity, especially a formal exercise routine.  Recommended that the patient eventually strive for at least 150 minutes of moderate cardiovascular activity per week according to guidelines established by the [facility].   - Healthy dietary habits encouraged, including low-carb, and high amounts of lean protein in diet.   - Patient should also consume adequate amounts of water.   - As part of my medical decision making, I reviewed the following data within the [place] History obtained from pt /family, CMA notes reviewed and incorporated  if applicable, Labs reviewed, Radiograph/ tests reviewed if applicable and OV notes from prior OV's with me, as well as other specialists she/he has seen since seeing me last, were all reviewed and used in my medical decision making process today.   - Additionally, discussion had with patient regarding txmnt plan, their biases about that plan etc were used in my medical decision making today.   - The patient agreed with the plan and demonstrated an understanding of the instructions.   No barriers to understanding were identified.   - Red flag symptoms and signs discussed in detail.  Patient expressed understanding regarding what to do in case of emergency\ urgent symptoms.  The patient was advised to call back or seek an in-person evaluation if the symptoms worsen or if the condition fails to improve as anticipated.   Return for Medicare Wellness near October 30 2019, chronic care 6 months later.    Meds ordered this encounter  Medications  . escitalopram (LEXAPRO) 20 MG tablet    Sig: Take 1 tablet (20 mg total) by mouth daily.    Dispense:  90 tablet    Refill:  1    Medications Discontinued During This Encounter  Medication Reason  . escitalopram (LEXAPRO) 20 MG tablet Reorder      Note:  This note was prepared with assistance of Dragon voice recognition software. Occasional wrong-word or sound-a-like substitutions may have occurred due to the inherent limitations of voice recognition software.   This document serves as a record of services personally performed by Mellody Dance, DO. It was created on her behalf by Toni Amend, a trained medical scribe. The creation of this record is based on the scribe's personal observations and the provider's statements to them.   I have reviewed the above medical documentation for accuracy and completeness and I concur.  Mellody Dance, DO 08/11/2019 2:20 PM     Patient Care Team    Relationship Specialty Notifications Start End  Mellody Dance, DO PCP - General Family Medicine  10/28/18     -Vitals obtained; medications/ allergies reconciled;  personal medical, social, Sx etc.histories were updated by CMA, reviewed by me and are reflected in chart   Patient Active Problem List   Diagnosis Date Noted  . Prediabetes 08/11/2019    Priority: Medium  . Vitamin D insufficiency 08/11/2019  . Hypertriglyceridemia 08/11/2019  . Serum sodium elevated 08/11/2019  . dysthymia 10/28/2018  . (BMI 30.0-34.9) 10/28/2018  . Elevated blood pressure reading- no dx HTN 10/28/2018  . Family history of diabetes mellitus (DM) 10/28/2018     Current Meds  Medication Sig  . cholecalciferol (VITAMIN D3) 25 MCG (1000 UT) tablet Take 1,000 Units by mouth daily.  Marland Kitchen escitalopram (LEXAPRO) 20 MG tablet Take 1 tablet (20 mg total) by mouth daily.  . Multiple Vitamin (MULTIVITAMIN) tablet Take 1  tablet by mouth daily.  . [DISCONTINUED] escitalopram (LEXAPRO) 20 MG tablet Take 1 tablet (20 mg total) by mouth daily. OFFICE VISIT REQUIRED PRIOR TO ANY FURTHER REFILLS     No Known Allergies   ROS:  See above HPI for pertinent positives and negatives   Objective:   Blood pressure 126/68, weight 250 lb (113.4 kg).  (if some vitals are omitted, this means that patient was UNABLE to obtain them even though they were asked to get them prior to OV today.  They were asked to call us at their earliest convenience with these once obtained.)  General: A & O * 3;  visually in no acute distress; in usual state of health.  Skin: Visible skin appears normal and pt's usual skin color HEENT:  EOMI, head is normocephalic and atraumatic.  Sclera are anicteric. Neck has a good range of motion.  Lips are noncyanotic Chest: normal chest excursion and movement Respiratory: speaking in full sentences, no conversational dyspnea; no use of accessory muscles Psych: insight good, mood- appears full

## 2019-08-25 DIAGNOSIS — M25561 Pain in right knee: Secondary | ICD-10-CM | POA: Insufficient documentation

## 2019-10-29 ENCOUNTER — Ambulatory Visit (INDEPENDENT_AMBULATORY_CARE_PROVIDER_SITE_OTHER): Payer: Medicare HMO | Admitting: Family Medicine

## 2019-10-29 ENCOUNTER — Other Ambulatory Visit: Payer: Self-pay

## 2019-10-29 ENCOUNTER — Encounter: Payer: Self-pay | Admitting: Family Medicine

## 2019-10-29 VITALS — BP 130/70 | Temp 98.5°F | Ht 72.0 in | Wt 250.0 lb

## 2019-10-29 DIAGNOSIS — Z87891 Personal history of nicotine dependence: Secondary | ICD-10-CM | POA: Insufficient documentation

## 2019-10-29 DIAGNOSIS — Z1211 Encounter for screening for malignant neoplasm of colon: Secondary | ICD-10-CM | POA: Diagnosis not present

## 2019-10-29 DIAGNOSIS — F341 Dysthymic disorder: Secondary | ICD-10-CM

## 2019-10-29 DIAGNOSIS — E559 Vitamin D deficiency, unspecified: Secondary | ICD-10-CM

## 2019-10-29 DIAGNOSIS — E781 Pure hyperglyceridemia: Secondary | ICD-10-CM

## 2019-10-29 DIAGNOSIS — F329 Major depressive disorder, single episode, unspecified: Secondary | ICD-10-CM | POA: Diagnosis not present

## 2019-10-29 DIAGNOSIS — Z0000 Encounter for general adult medical examination without abnormal findings: Secondary | ICD-10-CM

## 2019-10-29 DIAGNOSIS — Z23 Encounter for immunization: Secondary | ICD-10-CM

## 2019-10-29 DIAGNOSIS — F32A Depression, unspecified: Secondary | ICD-10-CM

## 2019-10-29 DIAGNOSIS — R7303 Prediabetes: Secondary | ICD-10-CM | POA: Diagnosis not present

## 2019-10-29 DIAGNOSIS — E870 Hyperosmolality and hypernatremia: Secondary | ICD-10-CM | POA: Diagnosis not present

## 2019-10-29 MED ORDER — SHINGRIX 50 MCG/0.5ML IM SUSR: 0.5000 mL | Freq: Once | INTRAMUSCULAR | 0 refills | Status: AC

## 2019-10-29 MED ORDER — ESCITALOPRAM OXALATE 20 MG PO TABS: 20.0000 mg | ORAL_TABLET | Freq: Every day | ORAL | 1 refills | Status: DC

## 2019-10-29 MED ORDER — PNEUMOVAX 23 25 MCG/0.5ML IJ INJ: 0.5000 mL | INJECTION | INTRAMUSCULAR | 0 refills | Status: AC

## 2019-10-29 NOTE — Progress Notes (Signed)
Subjective:   Patrick Buchanan is a 69 y.o. male who presents for Medicare Annual/Subsequent preventive examination.  HPI: Notes they are thinking ahead at home for when they continue aging, adding spaces to their house to live that are easier to access. He fell recently at his daughter's house in [place]. She just had a baby and they were helping out. Patient notes their apartment is upstairs in that house, and as he was coming down the stairs, there were objects set on the bottom of the stairs to be carried up later. He tripped over these. He fell forward from about the second step and caught himself on his left knee. Notes "there was nothing to it; I didn't even ache in the morning." Notes this was entirely an accident and not idiopathic. Denies concerns with memory, hearing, or forgetfulness. Went to have his right knee assessed by specialist Dr. Gladstone Lighter recently because "there's something going on in there." Says he was told there was a crystallization present, similar to gout, but not gout in that knee. States he was given a lidocaine injection and a steroid and notes "it seems to have taken care of it fine." His last colonoscopy was years ago with [facility]. Says "they hurt the hell out of me, and I don't know what happened, but they just don't have a record of me." Notes his insurance paid for this procedure despite the lack of records. He has only had one colonoscopy in his life. Denies family history of colon cancer. Notes he has been terrified of COVID, "scared to death," and has been very compliant with safety guidelines. Patient smoked for 10 years, at around a pack per day in the past. Notes "it was a real pain to quit." He quit in 2005. 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 "I don't seem to have any respiratory problems or SOB." Notes he's had plenty of CT scans in the past, but has never had an MRI since he cannot have one.      Objective:    Vitals: BP 130/70   Temp 98.5 F (36.9 C) (Oral)   Ht 6' (1.829 m)   Wt 250 lb (113.4 kg)   BMI 33.91 kg/m   Body mass index is 33.91 kg/m.  Advanced Directives 01/01/2019 10/28/2018  Does Patient Have a Medical Advance Directive? Yes Yes  Type of Advance Directive Living will;Healthcare Power of [place]  Does patient want to make changes to medical advance directive? - No - Patient declined  Copy of [place] in Chart? - No - copy requested    Tobacco Social History   Tobacco Use  Smoking Status Former Smoker  . Packs/day: 1.00  . Years: 10.00  . Pack years: 10.00  . Types: Cigarettes  . Quit date: 2005  . Years since quitting: 16.0  Smokeless Tobacco Never Used     Counseling given: Not Answered   Past Medical History:  Diagnosis Date  . Allergy    hay fever  . Blood transfusion without reported diagnosis   . Depression   . GERD (gastroesophageal reflux disease)    past hx    Past Surgical History:  Procedure Laterality Date  . APPENDECTOMY    . CEREBRAL ANEURYSM REPAIR  1992  . INGUINAL HERNIA REPAIR     x2  . TONSILLECTOMY    . TUMOR EXCISION  2005   benign cervical  Family History  Problem Relation Age of Onset  . Breast cancer Mother   . Diabetes Brother   . Colon cancer Neg Hx   . Colon polyps Neg Hx   . Esophageal cancer Neg Hx   . Rectal cancer Neg Hx   . Stomach cancer Neg Hx    Social History   Socioeconomic History  . Marital status: Married    Spouse name: Not on file  . Number of children: Not on file  . Years of education: Not on file  . Highest education level: Not on file  Occupational History  . Not on file  Tobacco Use  . Smoking status: Former Smoker    Packs/day: 1.00    Years: 10.00    Pack years: 10.00    Types: Cigarettes    Quit date: 2005    Years since quitting: 16.0  . Smokeless tobacco: Never Used  Substance and Sexual Activity  . Alcohol use:  Yes    Comment: occasionally   . Drug use: Never  . Sexual activity: Not Currently  Other Topics Concern  . Not on file  Social History Narrative  . Not on file   Social Determinants of Health   Financial Resource Strain:   . Difficulty of Paying Living Expenses: Not on file  Food Insecurity:   . Worried About Charity fundraiser in the Last Year: Not on file  . Ran Out of Food in the Last Year: Not on file  Transportation Needs:   . Lack of Transportation (Medical): Not on file  . Lack of Transportation (Non-Medical): Not on file  Physical Activity:   . Days of Exercise per Week: Not on file  . Minutes of Exercise per Session: Not on file  Stress:   . Feeling of Stress : Not on file  Social Connections:   . Frequency of Communication with Friends and Family: Not on file  . Frequency of Social Gatherings with Friends and Family: Not on file  . Attends Religious Services: Not on file  . Active Member of Clubs or Organizations: Not on file  . Attends Archivist Meetings: Not on file  . Marital Status: Not on file    Outpatient Encounter Medications as of 10/29/2019  Medication Sig  . cholecalciferol (VITAMIN D3) 25 MCG (1000 UT) tablet Take 1,000 Units by mouth daily.  Marland Kitchen escitalopram (LEXAPRO) 20 MG tablet Take 1 tablet (20 mg total) by mouth daily.  . Multiple Vitamin (MULTIVITAMIN) tablet Take 1 tablet by mouth daily.   No facility-administered encounter medications on file as of 10/29/2019.    Activities of Daily Living In your present state of health, do you have any difficulty performing the following activities: 10/29/2019  Hearing? N  Vision? N  Difficulty concentrating or making decisions? N  Walking or climbing stairs? Y  Dressing or bathing? N  Doing errands, shopping? N  Some recent data might be hidden    Patient Care Team: Mellody Dance, DO as PCP - General (Family Medicine)   Assessment:   This is a routine wellness examination for  [place].  Exercise Activities and Dietary recommendations    Goals   None     Fall Risk Fall Risk  10/29/2019 10/28/2018  Falls in the past year? 1 0  Number falls in past yr: 0 -  Injury with Fall? 0 -  Follow up Falls evaluation completed Falls evaluation completed   Is the patient's home free of loose throw rugs  in walkways, pet beds, electrical cords, etc?   yes      Grab bars in the bathroom? no      Handrails on the stairs?   yes      Adequate lighting?   yes  Timed Get Up and Go Performed: Telehealth  Depression Screen PHQ 2/9 Scores 10/29/2019 08/11/2019 10/28/2018  PHQ - 2 Score 1 0 0  PHQ- 9 Score 1 0 0    Cognitive Function 6CIT Screen 10/29/2019  What Year? 0 points  What month? 0 points  What time? 0 points  Count back from 20 0 points  Months in reverse 0 points  Repeat phrase 0 points  Total Score 0    Immunization History  Administered Date(s) Administered  . Influenza, High Dose Seasonal PF 08/07/2018  . Influenza-Unspecified 08/19/2019  . Tdap 10/27/2015    Qualifies for Shingles Vaccine?Ordered for pharmacy Pneumococcal Vaccine: Ordered for the pharmacy  Screening Tests Health Maintenance  Topic Date Due  . COLONOSCOPY  10/29/2019 (Originally 05/02/2003)  . Hepatitis C Screening  10/29/2019 (Originally November 21, 1950)  . PNA vac Low Risk Adult (1 of 2 - PCV13) 10/29/2019 (Originally 05/01/2018)  . TETANUS/TDAP  10/26/2025  . INFLUENZA VACCINE  Completed   Cancer Screenings: Lung: Low Dose CT Chest recommended if Age 51-80 years, 30 pack-year currently smoking OR have quit w/in 15years. Patient does not qualify. Colorectal: Ordered   Additional Screenings: Hepatitis C Screening: Future order HIV: Future order       Plan:  MEDICARE WELLNESS 1:15 10/29/2019 PLAN: - Educated patient regarding critical need to keep all screenings and immunizations up to date. - Need for colonoscopy near future as advised. Lengthy discussion held regarding need to obtain  this ASAP. - Regarding COVID-19 immunization, discussed that patient will be included in phase two of vaccination distribution. Counseling and education provided and all questions answered. - TDAP up to date. - Need for Shingrix vaccination and pneumonia vaccination. - Patient has obtained AAA screen in the past, March 2 of 2020. - Blood work last obtained February of 2020. Discussed need for repeat labs with patient during appointment today.   I have personally reviewed and noted the following in the patient's chart:   . Medical and social history . Use of alcohol, tobacco or illicit drugs  . Current medications and supplements . Functional ability and status . Nutritional status . Physical activity . Advanced directives . List of other physicians . Hospitalizations, surgeries, and ER visits in previous 12 months . Vitals . Screenings to include cognitive, depression, and falls . Referrals and appointments  In addition, I have reviewed and discussed with patient certain preventive protocols, quality metrics, and best practice recommendations. A written personalized care plan for preventive services as well as general preventive health recommendations were provided to patient.

## 2019-11-07 ENCOUNTER — Other Ambulatory Visit: Payer: Self-pay

## 2019-11-07 ENCOUNTER — Other Ambulatory Visit: Payer: Medicare HMO

## 2019-11-07 DIAGNOSIS — R7303 Prediabetes: Secondary | ICD-10-CM

## 2019-11-07 DIAGNOSIS — Z0000 Encounter for general adult medical examination without abnormal findings: Secondary | ICD-10-CM

## 2019-11-07 DIAGNOSIS — E559 Vitamin D deficiency, unspecified: Secondary | ICD-10-CM

## 2019-11-07 DIAGNOSIS — E870 Hyperosmolality and hypernatremia: Secondary | ICD-10-CM

## 2019-11-07 DIAGNOSIS — E781 Pure hyperglyceridemia: Secondary | ICD-10-CM

## 2019-11-08 LAB — HEMOGLOBIN A1C
Est. average glucose Bld gHb Est-mCnc: 140 mg/dL
Hgb A1c MFr Bld: 6.5 % — ABNORMAL HIGH (ref 4.8–5.6)

## 2019-11-08 LAB — CBC WITH DIFFERENTIAL/PLATELET
Basophils Absolute: 0 10*3/uL (ref 0.0–0.2)
Basos: 1 %
EOS (ABSOLUTE): 0.1 10*3/uL (ref 0.0–0.4)
Eos: 2 %
Hematocrit: 42.3 % (ref 37.5–51.0)
Hemoglobin: 14.4 g/dL (ref 13.0–17.7)
Immature Grans (Abs): 0 10*3/uL (ref 0.0–0.1)
Immature Granulocytes: 0 %
Lymphocytes Absolute: 1.5 10*3/uL (ref 0.7–3.1)
Lymphs: 25 %
MCH: 30.7 pg (ref 26.6–33.0)
MCHC: 34 g/dL (ref 31.5–35.7)
MCV: 90 fL (ref 79–97)
Monocytes Absolute: 0.6 10*3/uL (ref 0.1–0.9)
Monocytes: 9 %
Neutrophils Absolute: 3.6 10*3/uL (ref 1.4–7.0)
Neutrophils: 63 %
Platelets: 252 10*3/uL (ref 150–450)
RBC: 4.69 x10E6/uL (ref 4.14–5.80)
RDW: 12.9 % (ref 11.6–15.4)
WBC: 5.8 10*3/uL (ref 3.4–10.8)

## 2019-11-08 LAB — COMPREHENSIVE METABOLIC PANEL
ALT: 35 IU/L (ref 0–44)
AST: 25 IU/L (ref 0–40)
Albumin/Globulin Ratio: 1.6 (ref 1.2–2.2)
Albumin: 4.1 g/dL (ref 3.8–4.8)
Alkaline Phosphatase: 107 IU/L (ref 39–117)
BUN/Creatinine Ratio: 20 (ref 10–24)
BUN: 19 mg/dL (ref 8–27)
Bilirubin Total: 0.4 mg/dL (ref 0.0–1.2)
CO2: 25 mmol/L (ref 20–29)
Calcium: 9.4 mg/dL (ref 8.6–10.2)
Chloride: 103 mmol/L (ref 96–106)
Creatinine, Ser: 0.93 mg/dL (ref 0.76–1.27)
GFR calc Af Amer: 99 mL/min/{1.73_m2} (ref >59)
GFR calc non Af Amer: 85 mL/min/{1.73_m2} (ref >59)
Globulin, Total: 2.5 g/dL (ref 1.5–4.5)
Glucose: 134 mg/dL — ABNORMAL HIGH (ref 65–99)
Potassium: 4.7 mmol/L (ref 3.5–5.2)
Sodium: 141 mmol/L (ref 134–144)
Total Protein: 6.6 g/dL (ref 6.0–8.5)

## 2019-11-08 LAB — LIPID PANEL
Chol/HDL Ratio: 5.2 ratio — ABNORMAL HIGH (ref 0.0–5.0)
Cholesterol, Total: 160 mg/dL (ref 100–199)
HDL: 31 mg/dL — ABNORMAL LOW (ref >39)
LDL Chol Calc (NIH): 60 mg/dL (ref 0–99)
Triglycerides: 455 mg/dL — ABNORMAL HIGH (ref 0–149)
VLDL Cholesterol Cal: 69 mg/dL — ABNORMAL HIGH (ref 5–40)

## 2019-11-08 LAB — TSH: TSH: 2.74 u[IU]/mL (ref 0.450–4.500)

## 2019-11-08 LAB — VITAMIN D 25 HYDROXY (VIT D DEFICIENCY, FRACTURES): Vit D, 25-Hydroxy: 31.9 ng/mL (ref 30.0–100.0)

## 2019-11-08 LAB — T4, FREE: Free T4: 0.98 ng/dL (ref 0.82–1.77)

## 2019-11-18 ENCOUNTER — Ambulatory Visit (INDEPENDENT_AMBULATORY_CARE_PROVIDER_SITE_OTHER): Payer: Medicare HMO | Admitting: Family Medicine

## 2019-11-18 ENCOUNTER — Encounter: Payer: Self-pay | Admitting: Family Medicine

## 2019-11-18 ENCOUNTER — Other Ambulatory Visit: Payer: Self-pay

## 2019-11-18 VITALS — BP 127/74 | Temp 98.5°F | Ht 72.0 in | Wt 250.0 lb

## 2019-11-18 DIAGNOSIS — Z712 Person consulting for explanation of examination or test findings: Secondary | ICD-10-CM

## 2019-11-18 DIAGNOSIS — E559 Vitamin D deficiency, unspecified: Secondary | ICD-10-CM

## 2019-11-18 DIAGNOSIS — E1169 Type 2 diabetes mellitus with other specified complication: Secondary | ICD-10-CM

## 2019-11-18 DIAGNOSIS — Z6833 Body mass index (BMI) 33.0-33.9, adult: Secondary | ICD-10-CM

## 2019-11-18 DIAGNOSIS — E118 Type 2 diabetes mellitus with unspecified complications: Secondary | ICD-10-CM | POA: Diagnosis not present

## 2019-11-18 DIAGNOSIS — E781 Pure hyperglyceridemia: Secondary | ICD-10-CM

## 2019-11-18 DIAGNOSIS — Z7189 Other specified counseling: Secondary | ICD-10-CM

## 2019-11-18 DIAGNOSIS — E119 Type 2 diabetes mellitus without complications: Secondary | ICD-10-CM | POA: Insufficient documentation

## 2019-11-18 DIAGNOSIS — E782 Mixed hyperlipidemia: Secondary | ICD-10-CM

## 2019-11-18 DIAGNOSIS — Z713 Dietary counseling and surveillance: Secondary | ICD-10-CM | POA: Diagnosis not present

## 2019-11-18 DIAGNOSIS — Z515 Encounter for palliative care: Secondary | ICD-10-CM | POA: Insufficient documentation

## 2019-11-18 MED ORDER — VITAMIN D (ERGOCALCIFEROL) 1.25 MG (50000 UNIT) PO CAPS: ORAL_CAPSULE | ORAL | 3 refills | Status: DC

## 2019-11-18 NOTE — Patient Instructions (Addendum)
Risk factors for prediabetes and type 2 diabetes  Researchers don't fully understand why some people develop prediabetes and type 2 diabetes and others don't.  It's clear that certain factors increase the risk, however, including:  Weight. The more fatty tissue you have, the more resistant your cells become to insulin.  Inactivity. The less active you are, the greater your risk. Physical activity helps you control your weight, uses up glucose as energy and makes your cells more sensitive to insulin.  Family history. Your risk increases if a parent or sibling has type 2 diabetes.  Race. Although it's unclear why, people of certain races -- including blacks, Hispanics, American Indians and Asian-Americans -- are at higher risk.  Age. Your risk increases as you get older. This may be because you tend to exercise less, lose muscle mass and gain weight as you age. But type 2 diabetes is also increasing dramatically among children, adolescents and younger adults.  Gestational diabetes. If you developed gestational diabetes when you were pregnant, your risk of developing prediabetes and type 2 diabetes later increases. If you gave birth to a baby weighing more than 9 pounds (4 kilograms), you're also at risk of type 2 diabetes.  Polycystic ovary syndrome. For women, having polycystic ovary syndrome -- a common condition characterized by irregular menstrual periods, excess hair growth and obesity -- increases the risk of diabetes.  High blood pressure. Having blood pressure over 140/90 millimeters of mercury (mm Hg) is linked to an increased risk of type 2 diabetes.  Abnormal cholesterol and triglyceride levels. If you have low levels of high-density lipoprotein (HDL), or "good," cholesterol, your risk of type 2 diabetes is higher. Triglycerides are another type of fat carried in the blood. People with high levels of triglycerides have an increased risk of type 2 diabetes. Your doctor can let you know what  your cholesterol and triglyceride levels are.  A good guide to good carbs: The glycemic index ---If you have diabetes, or at risk for diabetes, you know all too well that when you eat carbohydrates, your blood sugar goes up. The total amount of carbs you consume at a meal or in a snack mostly determines what your blood sugar will do. But the food itself also plays a role. A serving of white rice has almost the same effect as eating pure table sugar -- a quick, high spike in blood sugar. A serving of lentils has a slower, smaller effect.  ---Picking good sources of carbs can help you control your blood sugar and your weight. Even if you don't have diabetes, eating healthier carbohydrate-rich foods can help ward off a host of chronic conditions, from heart disease to various cancers to, well, diabetes.  ---One way to choose foods is with the glycemic index (GI). This tool measures how much a food boosts blood sugar.  The glycemic index rates the effect of a specific amount of a food on blood sugar compared with the same amount of pure glucose. A food with a glycemic index of 28 boosts blood sugar only 28% as much as pure glucose. One with a GI of 95 acts like pure glucose.    High glycemic foods result in a quick spike in insulin and blood sugar (also known as blood glucose).  Low glycemic foods have a slower, smaller effect- these are healthier for you.   Using the glycemic index Using the glycemic index is easy: choose foods in the low GI category instead of those in the high  GI category (see below), and go easy on those in between. Low glycemic index (GI of 55 or less): Most fruits and vegetables, beans, minimally processed grains, pasta, low-fat dairy foods, and nuts.  Moderate glycemic index (GI 56 to 69): White and sweet potatoes, corn, white rice, couscous, breakfast cereals such as Cream of Wheat and Mini Wheats.  High glycemic index (GI of 70 or higher): White bread, rice cakes, most  crackers, bagels, cakes, doughnuts, croissants, most packaged breakfast cereals. You can see the values for 100 commons foods and get links to more at [email or web address].si.  Swaps for lowering glycemic index  Instead of this high-glycemic index food Eat this lower-glycemic index food  White rice Brown rice or converted rice  Instant oatmeal Steel-cut oats  Cornflakes Bran flakes  Baked potato Pasta, bulgur  White bread Whole-grain bread  Corn Peas or leafy greens        Prediabetes Eating Plan  Prediabetes--also called impaired glucose tolerance or impaired fasting glucose--is a condition that causes blood sugar (blood glucose) levels to be higher than normal. Following a healthy diet can help to keep prediabetes under control. It can also help to lower the risk of type 2 diabetes and heart disease, which are increased in people who have prediabetes. Along with regular exercise, a healthy diet:  Promotes weight loss.  Helps to control blood sugar levels.  Helps to improve the way that the body uses insulin.   WHAT DO I NEED TO KNOW ABOUT THIS EATING PLAN?   Use the glycemic index (GI) to plan your meals. The index tells you how quickly a food will raise your blood sugar. Choose low-GI foods. These foods take a longer time to raise blood sugar.  Pay close attention to the amount of carbohydrates in the food that you eat. Carbohydrates increase blood sugar levels.  Keep track of how many calories you take in. Eating the right amount of calories will help you to achieve a healthy weight. Losing about 7 percent of your starting weight can help to prevent type 2 diabetes.  You may want to follow a Mediterranean diet. This diet includes a lot of vegetables, lean meats or fish, whole grains, fruits, and healthy oils and fats.   WHAT FOODS CAN I EAT?  Grains Whole grains, such as whole-wheat or whole-grain breads, crackers, cereals, and pasta. Unsweetened oatmeal.  Bulgur. Barley. Quinoa. Brown rice. Corn or whole-wheat flour tortillas or taco shells. Vegetables Lettuce. Spinach. Peas. Beets. Cauliflower. Cabbage. Broccoli. Carrots. Tomatoes. Squash. Eggplant. Herbs. Peppers. Onions. Cucumbers. Brussels sprouts. Fruits Berries. Bananas. Apples. Oranges. Grapes. Papaya. Mango. Pomegranate. Kiwi. Grapefruit. Cherries. Meats and Other Protein Sources Seafood. Lean meats, such as chicken and [place] or lean cuts of pork and beef. Tofu. Eggs. Nuts. Beans. Dairy Low-fat or fat-free dairy products, such as yogurt, cottage cheese, and cheese. Beverages Water. Tea. Coffee. Sugar-free or diet soda. Seltzer water. Milk. Milk alternatives, such as soy or almond milk. Condiments Mustard. Relish. Low-fat, low-sugar ketchup. Low-fat, low-sugar barbecue sauce. Low-fat or fat-free mayonnaise. Sweets and Desserts Sugar-free or low-fat pudding. Sugar-free or low-fat ice cream and other frozen treats. Fats and Oils Avocado. Walnuts. Olive oil. The items listed above may not be a complete list of recommended foods or beverages. Contact your dietitian for more options.    WHAT FOODS ARE NOT RECOMMENDED?  Grains Refined white flour and flour products, such as bread, pasta, snack foods, and cereals. Beverages Sweetened drinks, such as sweet iced tea and soda. Sweets and  Desserts Baked goods, such as cake, cupcakes, pastries, cookies, and cheesecake. The items listed above may not be a complete list of foods and beverages to avoid. Contact your dietitian for more information.   This information is not intended to replace advice given to you by your health care provider. Make sure you discuss any questions you have with your health care provider.   Document Released: 02/23/2015 Document Reviewed: 02/23/2015 Elsevier Interactive Patient Education 2016 [place] for a Low Cholesterol, Low Saturated Fat Diet   Fats - Limit total intake of fats  and oils. - Avoid butter, stick margarine, shortening, lard, palm and coconut oils. - Limit mayonnaise, salad dressings, gravies and sauces, unless they are homemade with low-fat ingredients. - Limit chocolate. - Choose low-fat and nonfat products, such as low-fat mayonnaise, low-fat or non-hydrogenated peanut butter, low-fat or fat-free salad dressings and nonfat gravy. - Use vegetable oil, such as canola or olive oil. - Look for margarine that does not contain trans fatty acids. - Use nuts in moderate amounts. - Read ingredient labels carefully to determine both amount and type of fat present in foods. Limit saturated and trans fats! - Avoid high-fat processed and convenience foods.  Meats and Meat Alternatives - Choose fish, chicken, [place] and lean meats. - Use dried beans, peas, lentils and tofu. - Limit egg yolks to three to four per week. - If you eat red meat, limit to no more than three servings per week and choose loin or round cuts. - Avoid fatty meats, such as bacon, sausage, franks, luncheon meats and ribs. - Avoid all organ meats, including liver.  Dairy - Choose nonfat or low-fat milk, yogurt and cottage cheese. - Most cheeses are high in fat. Choose cheeses made from non-fat milk, such as mozzarella and ricotta cheese. - Choose light or fat-free cream cheese and sour cream. - Avoid cream and sauces made with cream.  Fruits and Vegetables - Eat a wide variety of fruits and vegetables. - Use lemon juice, vinegar or "mist" olive oil on vegetables. - Avoid adding sauces, fat or oil to vegetables.  Breads, Cereals and Grains - Choose whole-grain breads, cereals, pastas and rice. - Avoid high-fat snack foods, such as granola, cookies, pies, pastries, doughnuts and croissants.  Cooking Tips - Avoid deep fried foods. - Trim visible fat off meats and remove skin from poultry before cooking. - Bake, broil, boil, poach or roast poultry, fish and lean meats. - Drain and  discard fat that drains out of meat as you cook it. - Add little or no fat to foods. - Use vegetable oil sprays to grease pans for cooking or baking. - Steam vegetables. - Use herbs or no-oil marinades to flavor foods.    Nine ways to increase your "good" HDL cholesterol  High-density lipoprotein (HDL) is often referred to as the "good" cholesterol. Having high HDL levels helps carry cholesterol from your arteries to your liver, where it can be used or excreted.  Having high levels of HDL also has antioxidant and anti-inflammatory effects, and is linked to a reduced risk of heart disease (1, 2).  Most health experts recommend minimum blood levels of 40 mg/dl in men and 50 mg/dl in women.  While genetics definitely play a role, there are several other factors that affect HDL levels.  Here are nine healthy ways to raise your "good" HDL cholesterol.  1. Consume olive oil  two pieces of salmon on a plate olive  oil being poured into a small dish Extra virgin olive oil may be more healthful than processed olive oils. Olive oil is one of the healthiest fats around.  A large analysis of 42 studies with more than 800,000 participants found that olive oil was the only source of monounsaturated fat that seemed to reduce heart disease risk (3).  Research has shown that one of olive oil's heart-healthy effects is an increase in HDL cholesterol. This effect is thought to be caused by antioxidants it contains called polyphenols (4, 5, 6, 7).  Extra virgin olive oil has more polyphenols than more processed olive oils, although the amount can still vary among different types and brands.  One study gave 200 healthy young men about 2 tablespoons (25 ml) of different olive oils per day for three weeks.  The researchers found that participants' HDL levels increased significantly more after they consumed the olive oil with the highest polyphenol content (6).  In another study, when 66 older adults  consumed about 4 tablespoons (50 ml) of high-polyphenol extra virgin olive oil every day for six weeks, their HDL cholesterol increased by 6.5 mg/dl, on average (7).  In addition to raising HDL levels, olive oil has been found to boost HDL's anti-inflammatory and antioxidant function in studies of older people and individuals with high cholesterol levels ( 7, 8, 9).  Whenever possible, select high-quality, certified extra virgin olive oils, which tend to be highest in polyphenols.  Bottom line: Extra virgin olive oil with a high polyphenol content has been shown to increase HDL levels in healthy people, the elderly and individuals with high cholesterol.  2. Follow a low-carb or ketogenic diet  Low-carb and ketogenic diets provide a number of health benefits, including weight loss and reduced blood sugar levels.  They have also been shown to increase HDL cholesterol in people who tend to have lower levels.  This includes those who are obese, insulin resistant or diabetic (10, 11, 12, 13, 14, 15, 16, 17).  In one study, people with type 2 diabetes were split into two groups.  One followed a diet consuming less than 50 grams of carbs per day. The other followed a high-carb diet.  Although both groups lost weight, the low-carb group's HDL cholesterol increased almost twice as much as the high-carb group's did (14).  In another study, obese people who followed a low-carb diet experienced an increase in HDL cholesterol of 5 mg/dl overall.  Meanwhile, in the same study, the participants who ate a low-fat, high-carb diet showed a decrease in HDL cholesterol (15).  This response may partially be due to the higher levels of fat people typically consume on low-carb diets.  One study in overweight women found that diets high in meat and cheese increased HDL levels by 5-8%, compared to a higher-carb diet (18).  What's more, in addition to raising HDL cholesterol, very-low-carb diets have been shown  to decrease triglycerides and improve several other risk factors for heart disease (13, 14, 16, 17).  Bottom line: Low-carb and ketogenic diets typically increase HDL cholesterol levels in people with diabetes, metabolic syndrome and obesity.  3. Exercise regularly  Being physically active is important for heart health.  Studies have shown that many different types of exercise are effective at raising HDL cholesterol, including strength training, high-intensity exercise and aerobic exercise (19, 20, 21, 22, 23, 24).  However, the biggest increases in HDL are typically seen with high-intensity exercise.  One small study followed women who were living  with polycystic ovary syndrome (PCOS), which is linked to a higher risk of insulin resistance. The study required them to perform high-intensity exercise three times a week.  The exercise led to an increase in HDL cholesterol of 8 mg/dL after 10 weeks. The women also showed improvements in other health markers, including decreased insulin resistance and improved arterial function (23).  In a 12-week study, overweight men who performed high-intensity exercise experienced a 10% increase in HDL cholesterol.  In contrast, the low-intensity exercise group showed only a 2% increase and the endurance training group experienced no change (24).  However, even lower-intensity exercise seems to increase HDL's anti-inflammatory and antioxidant capacities, whether or not HDL levels change (20, 21, 25).  Overall, high-intensity exercise such as high-intensity interval training (HIIT) and high-intensity circuit training (HICT) may boost HDL cholesterol levels the most.  Bottom line: Exercising several times per week can help raise HDL cholesterol and enhance its anti-inflammatory and antioxidant effects. High-intensity forms of exercise may be especially effective.  4. Add coconut oil to your diet  Studies have shown that coconut oil may reduce appetite,  increase metabolic rate and help protect brain health, among other benefits.  Some people may be concerned about coconut oil's effects on heart health due to its high saturated fat content.  However, it appears that coconut oil is actually quite heart healthy.  Coconut oil tends to raise HDL cholesterol more than many other types of fat.  In addition, it may improve the ratio of low-density-lipoprotein (LDL) cholesterol, the "bad" cholesterol, to HDL cholesterol. Improving this ratio reduces heart disease risk (26, 27, 28, 29).  One study examined the health effects of coconut oil on 20 women with excess belly fat. The researchers found that participants who took coconut oil daily experienced increased HDL cholesterol and a lower LDL-to-HDL ratio.  In contrast, the group who took soybean oil daily had a decrease in HDL cholesterol and an increase in the LDL-to-HDL ratio (29).  Most studies have found these health benefits occur at a dosage of about 2 tablespoons (30 ml) of coconut oil per day. It's best to incorporate this into cooking rather than eating spoonfuls of coconut oil on their own.  Bottom line: Consuming 2 tablespoons (30 ml) of coconut oil per day may help increase HDL cholesterol levels.  5. Stop smoking  cigarette butt Quitting smoking can reduce the risk of heart disease and lung cancer. Smoking increases the risk of many health problems, including heart disease and lung cancer (30).  One of its negative effects is a suppression of HDL cholesterol.  Some studies have found that quitting smoking can increase HDL levels. Indeed, one study found no significant differences in HDL levels between former smokers and people who had never smoked (31, 32, 33, 34, 35).  In a one-year study of more than 1,500 people, those who quit smoking had twice the increase in HDL as those who resumed smoking within the year. The number of large HDL particles also increased, which further reduced  heart disease risk (32).  One study followed smokers who switched from traditional cigarettes to electronic cigarettes for one year. They found that the switch was associated with an increase in HDL cholesterol of 5 mg/dl, on average (33).  When it comes to the effect of nicotine replacement patches on HDL levels, research results have been mixed.  One study found that nicotine replacement therapy led to higher HDL cholesterol. However, other research suggests that people who use nicotine patches  likely won't see increases in HDL levels until after replacement therapy is completed (34, 36).  Even in studies where HDL cholesterol levels didn't increase after people quit smoking, HDL function improved, resulting in less inflammation and other beneficial effects on heart health (37).  Bottom line: Quitting smoking can increase HDL levels, improve HDL function and help protect heart health.  6. Lose weight  When overweight and obese people lose weight, their HDL cholesterol levels usually increase.  What's more, this benefit seems to occur whether weight loss is achieved by calorie counting, carb restriction, intermittent fasting, weight loss surgery or a combination of diet and exercise (16, 38, 39, 40, 41, 42).  One study examined HDL levels in more than 3,000 overweight and obese [place] adults who followed a lifestyle modification program for one year.  The researchers found that losing at least 6.6 lbs (3 kg) led to an increase in HDL cholesterol of 4 mg/dl, on average (41).  In another study, when obese people with type 2 diabetes consumed calorie-restricted diets that provided 20-30% of calories from protein, they experienced significant increases in HDL cholesterol levels (42).  The key to achieving and maintaining healthy HDL cholesterol levels is choosing the type of diet that makes it easiest for you to lose weight and keep it off.  Bottom Line: Several methods of weight loss have  been shown to increase HDL cholesterol levels in people who are overweight or obese.  7. Choose purple produce  Consuming purple-colored fruits and vegetables is a delicious way to potentially increase HDL cholesterol.  Purple produce contains antioxidants known as anthocyanins.  Studies using anthocyanin extracts have shown that they help fight inflammation, protect your cells from damaging free radicals and may also raise HDL cholesterol levels (43, 44, 45, 46).  In a 24-week study of 45 people with diabetes, those who took an anthocyanin supplement twice a day experienced a 19% increase in HDL cholesterol, on average, along with other improvements in heart health markers (45).  In another study, when people with cholesterol issues took anthocyanin extract for 12 weeks, their HDL cholesterol levels increased by 13.7% (46).  Although these studies used extracts instead of foods, there are several fruits and vegetables that are very high in anthocyanins. These include eggplant, purple corn, red cabbage, blueberries, blackberries and black raspberries.  Bottom line: Consuming fruits and vegetables rich in anthocyanins may help increase HDL cholesterol levels.  8. Eat fatty fish often  The omega-3 fats in fatty fish provide major benefits to heart health, including a reduction in inflammation and better functioning of the cells that line your arteries (47, 48).  There's some research showing that eating fatty fish or taking fish oil may also help raise low levels of HDL cholesterol (49, 50, 51, 52, 53).  In a study of 33 heart disease patients, participants that consumed fatty fish four times per week experienced an increase in HDL cholesterol levels. The particle size of their HDL also increased (52).  In another study, overweight men who consumed herring five days a week for six weeks had a 5% increase in HDL cholesterol, compared with their levels after eating lean pork and chicken five  days a week (53).  However, there are a few studies that found no increase in HDL cholesterol in response to increased fish or omega-3 supplement intake (54, 55).  In addition to herring, other types of fatty fish that may help raise HDL cholesterol include salmon, sardines, mackerel and anchovies.  Bottom  line: Eating fatty fish several times per week may help increase HDL cholesterol levels and provide other benefits to heart health.  9. Avoid artificial trans fats  Artificial trans fats have many negative health effects due to their inflammatory properties (56, 57).  There are two types of trans fats. One kind occurs naturally in animal products, including full-fat dairy.  In contrast, the artificial trans fats found in margarines and processed foods are created by adding hydrogen to unsaturated vegetable and seed oils. These fats are also known as industrial trans fats or partially hydrogenated fats.  Research has shown that, in addition to increasing inflammation and contributing to several health problems, these artificial trans fats may lower HDL cholesterol levels.  In one study, researchers compared how people's HDL levels responded when they consumed different margarines.  The study found that participants' HDL cholesterol levels were 10% lower after consuming margarine containing partially hydrogenated soybean oil, compared to their levels after consuming palm oil (58).  Another controlled study followed 40 adults who had diets high in different types of trans fats.  They found that HDL cholesterol levels in women were significantly lower after they consumed the diet high in industrial trans fats, compared to the diet containing naturally occurring trans fats (59).  To protect heart health and keep HDL cholesterol in the healthy range, it's best to avoid artificial trans fats altogether.  Bottom line: Artificial trans fats have been shown to lower HDL levels and increase  inflammation, compared to other fats.  Take home message  Although your HDL cholesterol levels are partly determined by your genetics, there are many things you can do to naturally increase your own levels.  Fortunately, the practices that raise HDL cholesterol often provide other health benefits as well.        Diabetes Mellitus and Standards of Medical Care  Managing diabetes (diabetes mellitus) can be complicated. Your diabetes treatment may be managed by a team of health care providers, including:  A diet and nutrition specialist (registered dietitian).  A nurse.  A certified diabetes educator (CDE).  A diabetes specialist (endocrinologist).  An eye doctor.  A primary care provider.  A dentist.  Your health care providers follow a schedule in order to help you get the best quality of care. The following schedule is a general guideline for your diabetes management plan. Your health care providers may also give you more specific instructions.  HbA1c (hemoglobin A1c) test This test provides information about blood sugar (glucose) control over the previous 2-3 months. It is used to check whether your diabetes management plan needs to be adjusted.  If you are meeting your treatment goals, this test is done at least 2 times a year.  If you are not meeting treatment goals or if your treatment goals have changed, this test is done 4 times a year.  Blood pressure test  This test is done at every routine medical visit. For most people, the goal is less than 130/80. Ask your health care provider what your goal blood pressure should be.  Dental and eye exams  Visit your dentist two times a year.  If you have type 1 diabetes, get an eye exam 3-5 years after you are diagnosed, and then once a year after your first exam. ? If you were diagnosed with type 1 diabetes as a child, get an eye exam when you are age 30 or older and have had diabetes for 3-5 years. After the first  exam, you should  get an eye exam once a year.  If you have type 2 diabetes, have an eye exam as soon as you are diagnosed, and then once a year after your first exam.  Foot care exam  Visual foot exams are done at every routine medical visit. The exams check for cuts, bruises, redness, blisters, sores, or other problems with the feet.  A complete foot exam is done by your health care provider once a year. This exam includes an inspection of the structure and skin of your feet, and a check of the pulses and sensation in your feet. ? Type 1 diabetes: Get your first exam 3-5 years after diagnosis. ? Type 2 diabetes: Get your first exam as soon as you are diagnosed.  Check your feet every day for cuts, bruises, redness, blisters, or sores. If you have any of these or other problems that are not healing, contact your health care provider.  Kidney function test (urine microalbumin)  This test is done once a year. ? Type 1 diabetes: Get your first test 5 years after diagnosis. ? Type 2 diabetes: Get your first test as soon as you are diagnosed._  If you have chronic kidney disease (CKD), get a serum creatinine and estimated glomerular filtration rate (eGFR) test once a year.  Lipid profile (cholesterol, HDL, LDL, triglycerides)  This test should be done when you are diagnosed with diabetes, and every 5 years after the first test. If you are on medicines to lower your cholesterol, you may need to get this test done every year. ? The goal for LDL is less than 100 mg/dL (5.5 mmol/L). If you are at high risk, the goal is less than 70 mg/dL (3.9 mmol/L). ? The goal for HDL is 40 mg/dL (2.2 mmol/L) for men and 50 mg/dL(2.8 mmol/L) for women. An HDL cholesterol of 60 mg/dL (3.3 mmol/L) or higher gives some protection against heart disease. ? The goal for triglycerides is less than 150 mg/dL (8.3 mmol/L).  Immunizations  The yearly flu (influenza) vaccine is recommended for everyone 6 months or older  who has diabetes.  The pneumonia (pneumococcal) vaccine is recommended for everyone 2 years or older who has diabetes. If you are 71 or older, you may get the pneumonia vaccine as a series of two separate shots.  The hepatitis B vaccine is recommended for adults shortly after they have been diagnosed with diabetes.  The Tdap (tetanus, diphtheria, and pertussis) vaccine should be given: ? According to normal childhood vaccination schedules, for children. ? Every 10 years, for adults who have diabetes.  The shingles vaccine is recommended for people who have had chicken pox and are 50 years or older.  Mental and emotional health  Screening for symptoms of eating disorders, anxiety, and depression is recommended at the time of diagnosis and afterward as needed. If your screening shows that you have symptoms (you have a positive screening result), you may need further evaluation and be referred to a mental health care provider.  Diabetes self-management education  Education about how to manage your diabetes is recommended at diagnosis and ongoing as needed.  Treatment plan  Your treatment plan will be reviewed at every medical visit.  Summary  Managing diabetes (diabetes mellitus) can be complicated. Your diabetes treatment may be managed by a team of health care providers.  Your health care providers follow a schedule in order to help you get the best quality of care.  Standards of care including having regular physical exams, blood  tests, blood pressure monitoring, immunizations, screening tests, and education about how to manage your diabetes.  Your health care providers may also give you more specific instructions based on your individual health.      Type 2 Diabetes Mellitus, Self Care, Adult Caring for yourself after you have been diagnosed with type 2 diabetes (type 2 diabetes mellitus) means keeping your blood sugar (glucose) under control with a balance  of:  Nutrition.  Exercise.  Lifestyle changes.  Medicines or insulin, if necessary.  Support from your team of health care providers and others.  The following information explains what you need to know to manage your diabetes at home. What do I need to do to manage my blood glucose?  Check your blood glucose every day, as often as told by your health care provider.  Contact your health care provider if your blood glucose is above your target for 2 tests in a row.  Have your A1c (hemoglobin A1c) level checked at least two times a year, or as often as told by your health care provider. Your health care provider will set individualized treatment goals for you. Generally, the goal of treatment is to maintain the following blood glucose levels:  Before meals (preprandial): 80-130 mg/dL (4.4-7.2 mmol/L).  After meals (postprandial): below 180 mg/dL (10 mmol/L).  A1c level: less than 7%.  What do I need to know about hyperglycemia and hypoglycemia? What is hyperglycemia? Hyperglycemia, also called high blood glucose, occurs when blood glucose is too high.Make sure you know the early signs of hyperglycemia, such as:  Increased thirst.  Hunger.  Feeling very tired.  Needing to urinate more often than usual.  Blurry vision.  What is hypoglycemia? Hypoglycemia, also called low blood glucose, occurswith a blood glucose level at or below 70 mg/dL (3.9 mmol/L). The risk for hypoglycemia increases during or after exercise, during sleep, during illness, and when skipping meals or not eating for a long time (fasting). It is important to know the symptoms of hypoglycemia and treat it right away. Always have a 15-gram rapid-acting carbohydrate snack with you to treat low blood glucose. Family members and close friends should also know the symptoms and should understand how to treat hypoglycemia, in case you are not able to treat yourself. What are the symptoms of  hypoglycemia? Hypoglycemia symptoms can include:  Hunger.  Anxiety.  Sweating and feeling clammy.  Confusion.  Dizziness or feeling light-headed.  Sleepiness.  Nausea.  Increased heart rate.  Headache.  Blurry vision.  Seizure.  Nightmares.  Tingling or numbness around the mouth, lips, or tongue.  A change in speech.  Decreased ability to concentrate.  A change in coordination.  Restless sleep.  Tremors or shakes.  Fainting.  Irritability.  How do I treat hypoglycemia?  If you are alert and able to swallow safely, follow the 15:15 rule:  Take 15 grams of a rapid-acting carbohydrate. Rapid-acting options include: ? 1 tube of glucose gel. ? 3 glucose pills. ? 6-8 pieces of hard candy. ? 4 oz (120 mL) of fruit juice. ? 4 oz (120 mL) of regular (not diet) soda.  Check your blood glucose 15 minutes after you take the carbohydrate.  If the repeat blood glucose level is still at or below 70 mg/dL (3.9 mmol/L), take 15 grams of a carbohydrate again.  If your blood glucose level does not increase above 70 mg/dL (3.9 mmol/L) after 3 tries, seek emergency medical care.  After your blood glucose level returns to normal, eat  a meal or a snack within 1 hour.  How do I treat severe hypoglycemia? Severe hypoglycemia is when your blood glucose level is at or below 54 mg/dL (3 mmol/L). Severe hypoglycemia is an emergency. Do not wait to see if the symptoms will go away. Get medical help right away. Call your local emergency services (911 in the [place] Do not drive yourself to the hospital. If you have severe hypoglycemia and you cannot eat or drink, you may need an injection of glucagon. A family member or close friend should learn how to check your blood glucose and how to give you a glucagon injection. Ask your health care provider if you need to have an emergency glucagon injection kit available. Severe hypoglycemia may need to be treated in a hospital. The treatment  may include getting glucose through an IV tube. You may also need treatment for the cause of your hypoglycemia. Can having diabetes put me at risk for other conditions? Having diabetes can put you at risk for other long-term (chronic) conditions, such as heart disease and kidney disease. Your health care provider may prescribe medicines to help prevent complications from diabetes. These medicines may include:  Aspirin.  Medicine to lower cholesterol.  Medicine to control blood pressure.  What else can I do to manage my diabetes? Take your diabetes medicines as told  If your health care provider prescribed insulin or diabetes medicines, take them every day.  Do not run out of insulin or other diabetes medicines that you take. Plan ahead so you always have these available.  If you use insulin, adjust your dosage based on how physically active you are and what foods you eat. Your health care provider will tell you how to adjust your dosage. Make healthy food choices  The things that you eat and drink affect your blood glucose and your insulin dosage. Making good choices helps to control your diabetes and prevent other health problems. A healthy meal plan includes eating lean proteins, complex carbohydrates, fresh fruits and vegetables, low-fat dairy products, and healthy fats. Make an appointment to see a diet and nutrition specialist (registered dietitian) to help you create an eating plan that is right for you. Make sure that you:  Follow instructions from your health care provider about eating or drinking restrictions.  Drink enough fluid to keep your urine clear or pale yellow.  Eat healthy snacks between nutritious meals.  Track the carbohydrates that you eat. Do this by reading food labels and learning the standard serving sizes of foods.  Follow your sick day plan whenever you cannot eat or drink as usual. Make this plan in advance with your health care provider.  Stay  active  Exercise regularly, as told by your health care provider. This may include:  Stretching and doing strength exercises, such as yoga or weightlifting, at least 2 times a week.  Doing at least 150 minutes of moderate-intensity or vigorous-intensity exercise each week. This could be brisk walking, biking, or water aerobics. ? Spread out your activity over at least 3 days of the week. ? Do not go more than 2 days in a row without doing some kind of physical activity.  When you start a new exercise or activity, work with your health care provider to adjust your insulin, medicines, or food intake as needed. Make healthy lifestyle choices  Do not use any tobacco products, such as cigarettes, chewing tobacco, and e-cigarettes. If you need help quitting, ask your health care provider.  If  your health care provider says that alcohol is safe for you, limit alcohol intake to no more than 1 drink per day for nonpregnant women and 2 drinks per day for men. One drink equals 12 oz of beer, 5 oz of wine, or 1 oz of hard liquor.  Learn to manage stress. If you need help with this, ask your health care provider. Care for your body   Keep your immunizations up to date. In addition to getting vaccinations as told by your health care provider, it is recommended that you get vaccinated against the following illnesses: ? The flu (influenza). Get a flu shot every year. ? Pneumonia. ? Hepatitis B.  Schedule an eye exam soon after your diagnosis, and then one time every year after that.  Check your skin and feet every day for cuts, bruises, redness, blisters, or sores. Schedule a foot exam with your health care provider once every year.  Brush your teeth and gums two times a day, and floss at least one time a day. Visit your dentist at least once every 6 months.  Maintain a healthy weight. General instructions  Take over-the-counter and prescription medicines only as told by your health care  provider.  Share your diabetes management plan with people in your workplace, school, and household.  Check your urine for ketones when you are ill and as told by your health care provider.  Ask your health care provider: ? Do I need to meet with a diabetes educator? ? Where can I find a support group for people with diabetes?  Carry a medical alert card or wear medical alert jewelry.  Keep all follow-up visits as told by your health care provider. This is important. Where to find more information: For more information about diabetes, visit:  American Diabetes Association (ADA): www.diabetes.org  American Association of Diabetes Educators (AADE): www.diabeteseducator.org/patient-resources  This information is not intended to replace advice given to you by your health care provider. Make sure you discuss any questions you have with your health care provider. Document Released: 01/31/2016 Document Revised: 03/16/2016 Document Reviewed: 11/12/2015 Elsevier Interactive Patient Education  2017 [employer].      Blood Glucose Monitoring, Adult Monitoring your blood sugar (glucose) helps you manage your diabetes. It also helps you and your health care provider determine how well your diabetes management plan is working. Blood glucose monitoring involves checking your blood glucose as often as directed, and keeping a record (log) of your results over time. Why should I monitor my blood glucose? Checking your blood glucose regularly can:  Help you understand how food, exercise, illnesses, and medicines affect your blood glucose.  Let you know what your blood glucose is at any time. You can quickly tell if you are having low blood glucose (hypoglycemia) or high blood glucose (hyperglycemia).  Help you and your health care provider adjust your medicines as needed.  When should I check my blood glucose? Follow instructions from your health care provider about how often to check your  blood glucose.   This may depend on:  The type of diabetes you have.  How well-controlled your diabetes is.  Medicines you are taking.  If you have type 1 diabetes:  Check your blood glucose at least 2 times a day.  Also check your blood glucose: ? Before every insulin injection. ? Before and after exercise. ? Between meals. ? 2 hours after a meal. ? Occasionally between 2:00 a.m. and 3:00 a.m., as directed. ? Before potentially dangerous tasks,  like driving or using heavy machinery. ? At bedtime.  You may need to check your blood glucose more often, up to 6-10 times a day: ? If you use an insulin pump. ? If you need multiple daily injections (MDI). ? If your diabetes is not well-controlled. ? If you are ill. ? If you have a history of severe hypoglycemia. ? If you have a history of not knowing when your blood glucose is getting low (hypoglycemia unawareness).  If you have type 2 diabetes:  If you take insulin or other diabetes medicines, check your blood glucose at least 2 times a day.  If you are on intensive insulin therapy, check your blood glucose at least 4 times a day. Occasionally, you may also need to check between 2:00 a.m. and 3:00 a.m., as directed.  Also check your blood glucose: ? Before and after exercise. ? Before potentially dangerous tasks, like driving or using heavy machinery.  You may need to check your blood glucose more often if: ? Your medicine is being adjusted. ? Your diabetes is not well-controlled. ? You are ill.  What is a blood glucose log?  A blood glucose log is a record of your blood glucose readings. It helps you and your health care provider: ? Look for patterns in your blood glucose over time. ? Adjust your diabetes management plan as needed.  Every time you check your blood glucose, write down your result and notes about things that may be affecting your blood glucose, such as your diet and exercise for the day.  Most glucose  meters store a record of glucose readings in the meter. Some meters allow you to download your records to a computer. How do I check my blood glucose? Follow these steps to get accurate readings of your blood glucose: Supplies needed   Blood glucose meter.  Test strips for your meter. Each meter has its own strips. You must use the strips that come with your meter.  A needle to prick your finger (lancet). Do not use lancets more than once.  A device that holds the lancet (lancing device).  A journal or log book to write down your results.  Procedure  Wash your hands with soap and water.  Prick the side of your finger (not the tip) with the lancet. Use a different finger each time.  Gently rub the finger until a small drop of blood appears.  Follow instructions that come with your meter for inserting the test strip, applying blood to the strip, and using your blood glucose meter.  Write down your result and any notes.  Alternative testing sites  Some meters allow you to use areas of your body other than your finger (alternative sites) to test your blood.  If you think you may have hypoglycemia, or if you have hypoglycemia unawareness, do not use alternative sites. Use your finger instead.  Alternative sites may not be as accurate as the fingers, because blood flow is slower in these areas. This means that the result you get may be delayed, and it may be different from the result that you would get from your finger.  The most common alternative sites are: ? Forearm. ? Thigh. ? Palm of the hand.  Additional tips  Always keep your supplies with you.  If you have questions or need help, all blood glucose meters have a 24-hour "hotline" number that you can call. You may also contact your health care provider.  After you use a few  boxes of test strips, adjust (calibrate) your blood glucose meter by following instructions that came with your meter.    The American Diabetes  Association suggests the following targets for most nonpregnant adults with diabetes.  More or less stringent glycemic goals may be appropriate for each individual.  A1C: Less than 7% A1C may also be reported as eAG: Less than 154 mg/dl Before a meal (preprandial plasma glucose): 80-130 mg/dl 1-2 hours after beginning of the meal (Postprandial plasma glucose)*: Less than 180 mg/dl  *Postprandial glucose may be targeted if A1C goals are not met despite reaching preprandial glucose goals.   GOALS in short:  The goals are for the Hgb A1C to be less than 7.0 & blood pressure to be less than 130/80.    It is recommended that all diabetics are educated on and follow a healthy diabetic diet, exercise for 30 minutes 3-4 times per week (walking, biking, swimming, or machine), monitor blood glucose readings and bring that record with you to be reviewed at your next office visit.     You should be checking fasting blood sugars- especially after you eat poorly or eat really healthy, and also check 2 hour postprandial blood sugars after largest meal of the day.    Write these down and bring in your log at each office visit.    You will need to be seen every 3 months by the provider managing your Diabetes unless told otherwise by that provider.   You will need yearly eye exams from an eye specialist and foot exams to check the nerves of your feet.  Also, your urine should be checked yearly as well to make sure excess protein is not present.   If you are checking your blood pressure at home, please record it and bring it to your next office visit.    Follow the Dietary Approaches to Stop Hypertension (DASH) diet (3 servings of fruit and vegetables daily, whole grains, low sodium, low-fat proteins).  See below.    Lastly, when it comes to your cholesterol, the goal is to have the HDL (good cholesterol) >40, and the LDL (bad cholesterol) <100.   It is recommended that you follow a heart healthy, low saturated  and trans-fat diet and exercise for 30 minutes at least 5 times a week.     (( Check out the DASH diet = 1.5 Gram Low Sodium Diet   A 1.5 gram sodium diet restricts the amount of sodium in the diet to no more than 1.5 g or 1500 mg daily.  The American Heart Association recommends Americans over the age of 42 to consume no more than 1500 mg of sodium each day to reduce the risk of developing high blood pressure.  Research also shows that limiting sodium may reduce heart attack and stroke risk.  Many foods contain sodium for flavor and sometimes as a preservative.  When the amount of sodium in a diet needs to be low, it is important to know what to look for when choosing foods and drinks.  The following includes some information and guidelines to help make it easier for you to adapt to a low sodium diet.    QUICK TIPS  Do not add salt to food.  Avoid convenience items and fast food.  Choose unsalted snack foods.  Buy lower sodium products, often labeled as "lower sodium" or "no salt added."  Check food labels to learn how much sodium is in 1 serving.  When eating at a  restaurant, ask that your food be prepared with less salt or none, if possible.    READING FOOD LABELS FOR SODIUM INFORMATION  The nutrition facts label is a good place to find how much sodium is in foods. Look for products with no more than 400 mg of sodium per serving.  Remember that 1.5 g = 1500 mg.  The food label may also list foods as:  Sodium-free: Less than 5 mg in a serving.  Very low sodium: 35 mg or less in a serving.  Low-sodium: 140 mg or less in a serving.  Light in sodium: 50% less sodium in a serving. For example, if a food that usually has 300 mg of sodium is changed to become light in sodium, it will have 150 mg of sodium.  Reduced sodium: 25% less sodium in a serving. For example, if a food that usually has 400 mg of sodium is changed to reduced sodium, it will have 300 mg of sodium.    CHOOSING FOODS   Grains  Avoid: Salted crackers and snack items. Some cereals, including instant hot cereals. Bread stuffing and biscuit mixes. Seasoned rice or pasta mixes.  Choose: Unsalted snack items. Low-sodium cereals, oats, puffed wheat and rice, shredded wheat. English muffins and bread. Pasta.  Meats  Avoid: Salted, canned, smoked, spiced, pickled meats, including fish and poultry. Bacon, ham, sausage, cold cuts, hot dogs, anchovies.  Choose: Low-sodium canned tuna and salmon. Fresh or frozen meat, poultry, and fish.  Dairy  Avoid: Processed cheese and spreads. Cottage cheese. Buttermilk and condensed milk. Regular cheese.  Choose: Milk. Low-sodium cottage cheese. Yogurt. Sour cream. Low-sodium cheese.  Fruits and Vegetables  Avoid: Regular canned vegetables. Regular canned tomato sauce and paste. Frozen vegetables in sauces. Olives. Angie Fava. Relishes. Sauerkraut.  Choose: Low-sodium canned vegetables. Low-sodium tomato sauce and paste. Frozen or fresh vegetables. Fresh and frozen fruit.  Condiments  Avoid: Canned and packaged gravies. Worcestershire sauce. Tartar sauce. Barbecue sauce. Soy sauce. Steak sauce. Ketchup. Onion, garlic, and table salt. Meat flavorings and tenderizers.  Choose: Fresh and dried herbs and spices. Low-sodium varieties of mustard and ketchup. Lemon juice. Tabasco sauce. Horseradish.    SAMPLE 1.5 GRAM SODIUM MEAL PLAN:   Breakfast / Sodium (mg)  1 cup low-fat milk / 143 mg  1 whole-wheat English muffin / 240 mg  1 tbs heart-healthy margarine / 153 mg  1 hard-boiled egg / 139 mg  1 small orange / 0 mg  Lunch / Sodium (mg)  1 cup raw carrots / 76 mg  2 tbs no salt added peanut butter / 5 mg  2 slices whole-wheat bread / 270 mg  1 tbs jelly / 6 mg   cup red grapes / 2 mg  Dinner / Sodium (mg)  1 cup whole-wheat pasta / 2 mg  1 cup low-sodium tomato sauce / 73 mg  3 oz lean ground beef / 57 mg  1 small side salad (1 cup raw spinach leaves,  cup cucumber,  cup  yellow bell pepper) with 1 tsp olive oil and 1 tsp red wine vinegar / 25 mg  Snack / Sodium (mg)  1 container low-fat vanilla yogurt / 107 mg  3 graham cracker squares / 127 mg  Nutrient Analysis  Calories: 1745  Protein: 75 g  Carbohydrate: 237 g  Fat: 57 g  Sodium: 1425 mg  Document Released: 10/09/2005 Document Revised: 06/21/2011 Document Reviewed: 01/10/2010  ExitCare Patient Information 2012 [place].))    This information is  not intended to replace advice given to you by your health care provider. Make sure you discuss any questions you have with your health care provider. Document Released: 10/12/2003 Document Revised: 04/28/2016 Document Reviewed: 03/20/2016 Elsevier Interactive Patient Education  2017 [employer].

## 2019-11-18 NOTE — Progress Notes (Signed)
Telehealth office visit note for Patrick Buchanan, D.O- at [facility] at [facility]   I connected with current patient today and verified that I am speaking with the correct person using two identifiers.    Location of the patient: Home  Location of the provider: Office Only the patient (+/- their family members at pt's discretion) and myself were participating in the encounter - This visit type was conducted due to national recommendations for restrictions regarding the COVID-19 Pandemic (e.g. social distancing) in an effort to limit this patient's exposure and mitigate transmission in our community.  This format is felt to be most appropriate for this patient at this time.   - No physical exam could be performed with this format, beyond that communicated to [place] by the patient/ family members as noted.   - Additionally my office staff/ schedulers discussed with the patient that there may be a monetary charge related to this service, depending on their medical insurance.   The patient expressed understanding, and agreed to proceed.       History of Present Illness: No chief complaint on file.   Phillips Odor, am serving as scribe for Dr. Mellody Buchanan.  Patient is here to review lab work and discuss new diagnosis of diabetes.  Notes his brother is diabetic, and his cousin is diabetic.  States that since last visit, he has been on a diet trying to lose weight.  Feels his diet has been pretty horrible up until recently, a week or so ago when they began making changes.  "One thing I do remember, and this lasted longer than this, October of 2016, my daughter got married, and I know for a fact that at that point I weighed 190 lbs, and that stayed the same up until about a year or so ago."  Notes "being stuck in the house is not good for that."  He obtained his first round of the COVID-19 vaccine recently.    Filed Weights   11/18/19 1352  Weight: 250 lb (113.4 kg)     Most recent cholesterol panel was:  Lab Results  Component Value Date   CHOL 160 11/07/2019   HDL 31 (L) 11/07/2019   LDLCALC 60 11/07/2019   TRIG 455 (H) 11/07/2019   CHOLHDL 5.2 (H) 11/07/2019   Hepatic Function Latest Ref Rng & Units 11/07/2019 11/28/2018  Total Protein 6.0 - 8.5 g/dL 6.6 6.7  Albumin 3.8 - 4.8 g/dL 4.1 4.2  AST 0 - 40 IU/L 25 19  ALT 0 - 44 IU/L 35 25  Alk Phosphatase 39 - 117 IU/L 107 93  Total Bilirubin 0.0 - 1.2 mg/dL 0.4 0.5    Last A1C in the office was:  Lab Results  Component Value Date   HGBA1C 6.5 (H) 11/07/2019   HGBA1C 5.8 (H) 11/28/2018   Lab Results  Component Value Date   LDLCALC 60 11/07/2019   CREATININE 0.93 11/07/2019   BP Readings from Last 3 Encounters:  11/18/19 127/74  10/29/19 130/70  08/11/19 126/68   Wt Readings from Last 3 Encounters:  11/18/19 250 lb (113.4 kg)  10/29/19 250 lb (113.4 kg)  08/11/19 250 lb (113.4 kg)      No flowsheet data found.  Depression screen [facility] 2/9 11/18/2019 10/29/2019 08/11/2019 10/28/2018  Decreased Interest 0 0 0 0  Down, Depressed, Hopeless 0 1 0 0  PHQ - 2 Score 0 1 0 0  Altered sleeping 0 0 0 0  Tired, decreased energy 0 0 0 0  Change in appetite 1 0 0 0  Feeling bad or failure about yourself  0 0 0 0  Trouble concentrating 0 0 0 0  Moving slowly or fidgety/restless 0 0 0 0  Suicidal thoughts 0 0 0 0  PHQ-9 Score 1 1 0 0  Difficult doing work/chores Not difficult at all Not difficult at all Not difficult at all Not difficult at all   Recent Results (from the past 2160 hour(s))  Lipid panel     Status: Abnormal   Collection Time: 11/07/19 10:07 AM  Result Value Ref Range   Cholesterol, Total 160 100 - 199 mg/dL   Triglycerides 455 (H) 0 - 149 mg/dL   HDL 31 (L) >39 mg/dL   VLDL Cholesterol Cal 69 (H) 5 - 40 mg/dL   LDL Chol Calc (NIH) 60 0 - 99 mg/dL   Chol/HDL Ratio 5.2 (H) 0.0 - 5.0 ratio    Comment:                                   T. Chol/HDL Ratio                                              Men  Women                               1/2 Avg.Risk  3.4    3.3                                   Avg.Risk  5.0    4.4                                2X Avg.Risk  9.6    7.1                                3X Avg.Risk 23.4   11.0   VITAMIN D 25 Hydroxy (Vit-D Deficiency, Fractures)     Status: None   Collection Time: 11/07/19 10:07 AM  Result Value Ref Range   Vit D, 25-Hydroxy 31.9 30.0 - 100.0 ng/mL    Comment: Vitamin D deficiency has been defined by the [place] practice guideline as a level of serum 25-OH vitamin D less than 20 ng/mL (1,2). The Endocrine Society went on to further define vitamin D insufficiency as a level between 21 and 29 ng/mL (2). 1. IOM (Institute of Medicine). 2010. Dietary reference    intakes for calcium and D. [place]: The    [employer]. 2. Holick MF, Binkley Atkins, Bischoff-Ferrari HA, et al.    Evaluation, treatment, and prevention of vitamin D    deficiency: an Endocrine Society clinical practice    guideline. JCEM. 2011 Jul; 96(7):1911-30.   Hemoglobin A1c     Status: Abnormal   Collection Time: 11/07/19 10:07 AM  Result Value Ref Range   Hgb A1c MFr Bld 6.5 (H) 4.8 - 5.6 %    Comment:  Prediabetes: 5.7 - 6.4          Diabetes: >6.4          Glycemic control for adults with diabetes: <7.0    Est. average glucose Bld gHb Est-mCnc 140 mg/dL  T4, free     Status: None   Collection Time: 11/07/19 10:07 AM  Result Value Ref Range   Free T4 0.98 0.82 - 1.77 ng/dL  TSH     Status: None   Collection Time: 11/07/19 10:07 AM  Result Value Ref Range   TSH 2.740 0.450 - 4.500 uIU/mL  CMP (comprehensive metabolic panel)     Status: Abnormal   Collection Time: 11/07/19 10:07 AM  Result Value Ref Range   Glucose 134 (H) 65 - 99 mg/dL   BUN 19 8 - 27 mg/dL   Creatinine, Ser 0.93 0.76 - 1.27 mg/dL   GFR calc non Af Amer 85 >59 mL/min/1.73   GFR calc Af Amer 99 >59  mL/min/1.73   BUN/Creatinine Ratio 20 10 - 24   Sodium 141 134 - 144 mmol/L   Potassium 4.7 3.5 - 5.2 mmol/L   Chloride 103 96 - 106 mmol/L   CO2 25 20 - 29 mmol/L   Calcium 9.4 8.6 - 10.2 mg/dL   Total Protein 6.6 6.0 - 8.5 g/dL   Albumin 4.1 3.8 - 4.8 g/dL   Globulin, Total 2.5 1.5 - 4.5 g/dL   Albumin/Globulin Ratio 1.6 1.2 - 2.2   Bilirubin Total 0.4 0.0 - 1.2 mg/dL   Alkaline Phosphatase 107 39 - 117 IU/L   AST 25 0 - 40 IU/L   ALT 35 0 - 44 IU/L  CBC w/Diff     Status: None   Collection Time: 11/07/19 10:07 AM  Result Value Ref Range   WBC 5.8 3.4 - 10.8 x10E3/uL   RBC 4.69 4.14 - 5.80 x10E6/uL   Hemoglobin 14.4 13.0 - 17.7 g/dL   Hematocrit 42.3 37.5 - 51.0 %   MCV 90 79 - 97 fL   MCH 30.7 26.6 - 33.0 pg   MCHC 34.0 31.5 - 35.7 g/dL   RDW 12.9 11.6 - 15.4 %   Platelets 252 150 - 450 x10E3/uL   Neutrophils 63 Not Estab. %   Lymphs 25 Not Estab. %   Monocytes 9 Not Estab. %   Eos 2 Not Estab. %   Basos 1 Not Estab. %   Neutrophils Absolute 3.6 1.4 - 7.0 x10E3/uL   Lymphocytes Absolute 1.5 0.7 - 3.1 x10E3/uL   Monocytes Absolute 0.6 0.1 - 0.9 x10E3/uL   EOS (ABSOLUTE) 0.1 0.0 - 0.4 x10E3/uL   Basophils Absolute 0.0 0.0 - 0.2 x10E3/uL   Immature Granulocytes 0 Not Estab. %   Immature Grans (Abs) 0.0 0.0 - 0.1 x10E3/uL      Impression and Recommendations:    1. Type 2 diabetes mellitus with other specified complication, without long-term current use of insulin ([place])   2. Mixed diabetic hyperlipidemia associated with type 2 diabetes mellitus ([place])   3. Type 2 diabetes mellitus with hypertriglyceridemia (H[facility])   4. Morbid obesity ([place])-  BMI above 30 + DM   5. Vitamin D insufficiency   6. Counseling on health promotion and disease prevention       - Reviewed recent lab work (11/07/2019) in depth with patient today.  All lab work within normal limits unless otherwise noted.  Extensive education provided regarding new onset of diagnoses, and all questions  answered.  Type 2 Diabetes Mellitus -  New Onset - A1c last checked at 6.5, up from 5.8 prior.  - Discussed that now patient is considered diabetic.  - Counseled patient on pathophysiology of disease and discussed various treatment options, which always includes intensive dietary and lifestyle modification as first line.    - Per patient, preference is to treat with diet and lifestyle.  Discussed that diabetes is a progressive diease, worsening with age, and metformin can help to slow the progression of diabetes.  - Advised patient of option to referral for diabetic nutritionist to implement lifestyle changes. - Ambulatory referral to diabetic nutrition provided today.  See orders.  - Patient declines blood glucose monitoring equipment at this time.  Will discuss in near future.  - Importance of low carb, heart-healthy diet discussed with patient in addition to regular aerobic exercise of 83mn 5d/week or more., and weight loss if the patient's BMI is over 25.  - Check FBS and 2 hours after the biggest meal of your day.  Keep log and bring in next OV for my review.     - Also told patient if you ever feel poorly, please check your blood pressure and blood sugar, as one or the other could be the cause of your symptoms.  - Pt reminded about need for yearly eye and foot exams.  Told patient to make appt.for diabetic eye exam, CMAs here will do foot exams  - Handouts provided at patient's desire and or told to go online at the American Diabetes Association website for further information  - We will continue to monitor and re-check in 3 months after intensive dietary and lifestyle changes as discussed.  Will begin medications in future as needed.  Hyperlipidemia associated with DM - Reviewed patient's recent FLP today: Triglycerides = 455, elevated, up from 231 prior. HDL = 31, low, down from 42 prior. LDL = 60, down from 95 prior  The 10-year ASCVD risk score (Mikey BussingDC JBrooke Bonito, et al., 2013)  is: 15.1%  - Given patient's risk of greater than 7%, along with new diagnosis of diabetes, strongly recommended beginning statin management.  - Patient declines statins at this time.  - Per patient, wishes to work on diet and exercise for the next three months.  - To reduce incidence of S-E on statin, if he desires to begin statin management in future, discussed importance of drinking adequate water and engaging in regular physical activity.  Dietary changes such as low saturated & trans fat diets for hyperlipidemia and low carb diets for hypertriglyceridemia discussed with patient.    Encouraged patient to follow AHA guidelines for regular exercise and also engage in weight loss if BMI above 25.   Educational handouts provided at patient's desire and/ or told to look online at the [employer] for further information.  We will continue to monitor and re-check as discussed.  Vitamin D Deficiency - Up to 31.9 from 22.2 prior. - Discussed goal of maintenance in 40-60 range. - Once weekly Vitamin D prescribed to patient today. - Begin once-weekly pill in addition to daily Vitamin D OTC. - Will continue to monitor and re-check as discussed.  BMI Counseling - Body mass index is 33.91 kg/m Explained to patient what BMI refers to, and what it means medically.    Told patient to think about it as a "medical risk stratification measurement" and how increasing BMI is associated with increasing risk/ or worsening state of various diseases such as hypertension, hyperlipidemia, diabetes, premature OA, depression etc.  American Heart Association guidelines for healthy diet, basically Mediterranean diet, and exercise guidelines of 30 minutes 5 days per week or more discussed in detail.  - Discussed that if patient can lose weight back down to 190 lbs, from 250 lbs at present, there is a large chance that many of his diagnoses will improve.  Health counseling performed.  All  questions answered.  Health Counseling & Preventative Maintenance - Advised patient to continue working toward exercising and prudent weight loss to improve overall mental, physical, and emotional health.    - Reviewed the "spokes of the wheel" of mood and health management.  Stressed the importance of ongoing prudent habits, including regular exercise, appropriate sleep hygiene, healthful dietary habits, and prayer/meditation to relax.  - Encouraged patient to engage in daily physical activity as tolerated, especially a formal exercise routine.  Recommended that the patient eventually strive for at least 150 minutes of moderate cardiovascular activity per week according to guidelines established by the [facility].   - Healthy dietary habits encouraged, including low-carb, and high amounts of lean protein in diet.   - Patient should also consume adequate amounts of water.  COVID-19 Vaccine Counseling - Education provided to patient today regarding COVID-19 vaccination. - Patient knows to call in with further questions.  Recommendations - Patient will come to obtain printout AVS in near future.   - As part of my medical decision making, I reviewed the following data within the [place] History obtained from pt /family, CMA notes reviewed and incorporated if applicable, Labs reviewed, Radiograph/ tests reviewed if applicable and OV notes from prior OV's with me, as well as other specialists she/he has seen since seeing me last, were all reviewed and used in my medical decision making process today.    - Additionally, discussion had with patient regarding our treatment plan, and their biases/concerns about that plan were used in my medical decision making today.    - The patient agreed with the plan and demonstrated an understanding of the instructions.   No barriers to understanding were identified.    - Red flag symptoms and signs discussed in detail.  Patient expressed  understanding regarding what to do in case of emergency\ urgent symptoms.   - The patient was advised to call back or seek an in-person evaluation if the symptoms worsen or if the condition fails to improve as anticipated.   Return declines metformin, statin- reck labs 51mo for f/up labs FLP, A1c, vit D, with OV 3-4 days later, after 3 months of intensive lifestyle changes.    Orders Placed This Encounter  Procedures   VITAMIN D 25 Hydroxy (Vit-D Deficiency, Fractures)   Microalbumin / creatinine urine ratio   Lipid panel   Hemoglobin A1c   Amb Referral to Nutrition and Diabetic E    Meds ordered this encounter  Medications   Vitamin D, Ergocalciferol, (DRISDOL) 1.25 MG (50000 UNIT) CAPS capsule    Sig: Take one tablet wkly    Dispense:  12 capsule    Refill:  3    I provided 33+ minutes of non face-to-face time during this encounter.  Additional time was spent with charting and coordination of care before and after the actual visit commenced.   Note:  This note was prepared with assistance of Dragon voice recognition software. Occasional wrong-word or sound-a-like substitutions may have occurred due to the inherent limitations of voice recognition software.  This document serves as a record of services personally performed by DNeoma Laming  Carma Dwiggins, DO. It was created on her behalf by Toni Amend, a trained medical scribe. The creation of this record is based on the scribe's personal observations and the provider's statements to them.   This case required medical decision making of at least moderate complexity. The above documentation has been reviewed to be accurate and was completed by Marjory Sneddon, D.O.       Patient Care Team    Relationship Specialty Notifications Start End  Patrick Dance, DO PCP - General Family Medicine  10/28/18      -Vitals obtained; medications/ allergies reconciled;  personal medical, social, Sx etc.histories were updated by CMA,  reviewed by me and are reflected in chart   Patient Active Problem List   Diagnosis Date Noted   Prediabetes 08/11/2019   Mixed diabetic hyperlipidemia associated with type 2 diabetes mellitus ([place]) 11/18/2019   Diabetes mellitus ([place]) 11/18/2019   Counseling on health promotion and disease prevention 11/18/2019   History of smoking for 6-10 years 10/29/2019   Pain in right knee 08/25/2019   Vitamin D insufficiency 08/11/2019   Hypertriglyceridemia 08/11/2019   Serum sodium elevated 08/11/2019   dysthymia 10/28/2018   (BMI 30.0-34.9) 10/28/2018   Elevated blood pressure reading- no dx HTN 10/28/2018   Family history of diabetes mellitus (DM) 10/28/2018     Current Meds  Medication Sig   cholecalciferol (VITAMIN D3) 25 MCG (1000 UT) tablet Take 1,000 Units by mouth daily.   escitalopram (LEXAPRO) 20 MG tablet Take 1 tablet (20 mg total) by mouth daily.   Multiple Vitamin (MULTIVITAMIN) tablet Take 1 tablet by mouth daily.     Allergies:  No Known Allergies   ROS:  See above HPI for pertinent positives and negatives   Objective:   Blood pressure 127/74, temperature 98.5 F (36.9 C), height 6' (1.829 m), weight 250 lb (113.4 kg).  (if some vitals are omitted, this means that patient was UNABLE to obtain them even though they were asked to get them prior to OV today.  They were asked to call us at their earliest convenience with these once obtained. )  General: A & O * 3; sounds in no acute distress; in usual state of health.  Skin: Pt confirms warm and dry extremities and pink fingertips HEENT: Pt confirms lips non-cyanotic Chest: Patient confirms normal chest excursion and movement Respiratory: speaking in full sentences, no conversational dyspnea; patient confirms no use of accessory muscles Psych: insight appears good, mood- appears full

## 2019-11-20 ENCOUNTER — Encounter: Payer: Self-pay | Admitting: Family Medicine

## 2019-11-20 ENCOUNTER — Telehealth: Payer: Self-pay | Admitting: Family Medicine

## 2019-11-20 NOTE — Telephone Encounter (Signed)
I would recommend that we send patient to a different diabetic educator/nutritionist so he can get in earlier.    Additionally, if patient has changed his mind and wishes to start on the medications as we discussed yesterday, just let me know

## 2019-11-20 NOTE — Telephone Encounter (Signed)
Patient called states he was Referred to Nutritional Mgt by PCP but unable to get an appt til Late April 2021 -- New Diabetic & Cholesterol meds delayed implementing (Pending till his Appt w/ [facility]t Mgt provider).  ---Patient wants Provider to advise since 3 month out before the see Nutritional Mgt should he get & start taking the Diabetic & cholesterol Rx Dr. Jenetta Downer prescribed.  ---Forwarding message to med asst for review w/provider & to contact pt w/ instructions.  --glh

## 2019-11-20 NOTE — Telephone Encounter (Signed)
Please review and advise. AS, CMA 

## 2019-11-20 NOTE — Telephone Encounter (Signed)
Do I need to place a new referral to nutritionist or can we find a different place with the original referral?

## 2019-11-21 MED ORDER — ROSUVASTATIN CALCIUM 10 MG PO TABS: 10.0000 mg | ORAL_TABLET | Freq: Every day | ORAL | 1 refills | Status: DC

## 2019-12-12 ENCOUNTER — Encounter: Payer: Self-pay | Admitting: Family Medicine

## 2019-12-14 ENCOUNTER — Encounter: Payer: Self-pay | Admitting: Family Medicine

## 2019-12-14 DIAGNOSIS — Z1211 Encounter for screening for malignant neoplasm of colon: Secondary | ICD-10-CM

## 2019-12-17 NOTE — Progress Notes (Signed)
Place referral for Cologuard please.  Patient did not schedule colonoscopy and could not be reached

## 2019-12-19 NOTE — Telephone Encounter (Signed)
-----   Message from Mellody Dance, DO sent at 12/17/2019  6:48 PM EST ----- Regarding: FW: Bulk patient communication   ----- Message ----- From: Vincente Poli Sent: 12/12/2019  10:33 AM EST To: Mellody Dance, DO Subject: Bulk patient communication

## 2019-12-19 NOTE — Telephone Encounter (Signed)
Order has been placed. AS, CMA 

## 2019-12-23 ENCOUNTER — Encounter: Payer: Medicare HMO | Attending: Family Medicine | Admitting: Dietician

## 2019-12-23 ENCOUNTER — Encounter: Payer: Self-pay | Admitting: Family Medicine

## 2019-12-23 ENCOUNTER — Other Ambulatory Visit: Payer: Self-pay

## 2019-12-23 ENCOUNTER — Encounter: Payer: Self-pay | Admitting: Dietician

## 2019-12-23 DIAGNOSIS — Z794 Long term (current) use of insulin: Secondary | ICD-10-CM | POA: Diagnosis not present

## 2019-12-23 DIAGNOSIS — E1169 Type 2 diabetes mellitus with other specified complication: Secondary | ICD-10-CM

## 2019-12-23 NOTE — Progress Notes (Signed)
Patient was seen on 12/23/19 for the first of a series of three diabetes self-management courses at [facility]  Patient Education Plan per assessed needs and concerns is to attend three course education program for Diabetes Self Management Education.  The following learning objectives were met by the patient during this class:  Describe diabetes, types of diabetes and pathophysiology  State some common risk factors for diabetes  Defines the role of glucose and insulin  Describe the relationship between diabetes and cardiovascular and other risks  State the members of the Healthcare Team  States the rationale for glucose monitoring and when to test  State their individual [place] the importance of logging glucose readings and how to interpret the readings  Identifies A1C target  Explain the correlation between A1c and eAG values  State symptoms and treatment of high blood glucose and low blood glucose  Explain proper technique for glucose testing and identify proper sharps disposal  Handouts given during class include:  How to Thrive:  A Guide for Your Journey with Diabetes by the ADA  Meal Plan Card and carbohydrate content list  Dietary intake form  Low Sodium Flavoring Tips  Types of Fats  Dining Out  Label reading  Snack list  Planning a balanced meal  The diabetes portion plate  Diabetes Resources  A1c to eAG Conversion Chart  Blood Glucose Log  Diabetes Recommended Care Schedule  Support Group  Diabetes Success Plan  Core Class Satisfaction Survey   Follow-Up Plan:  Attend core 2

## 2019-12-30 ENCOUNTER — Other Ambulatory Visit: Payer: Self-pay

## 2019-12-30 ENCOUNTER — Encounter: Payer: Medicare HMO | Admitting: Dietician

## 2019-12-30 ENCOUNTER — Encounter: Payer: Self-pay | Admitting: Dietician

## 2019-12-30 DIAGNOSIS — Z794 Long term (current) use of insulin: Secondary | ICD-10-CM | POA: Diagnosis not present

## 2019-12-30 DIAGNOSIS — E1169 Type 2 diabetes mellitus with other specified complication: Secondary | ICD-10-CM | POA: Diagnosis not present

## 2019-12-30 NOTE — Progress Notes (Signed)
Patient was seen on 12/30/2019 for the second of a series of three diabetes self-management courses at [facility]. The following learning objectives were met by the patient during this class:   Describe the role of different macronutrients on glucose  Explain how carbohydrates affect blood glucose  State what foods contain the most carbohydrates  Demonstrate carbohydrate counting  Demonstrate how to read Nutrition Facts food label  Describe effects of various fats on heart health  Describe the importance of good nutrition for health and healthy eating strategies  Describe techniques for managing your shopping, cooking and meal planning  List strategies to follow meal plan when dining out  Describe the effects of alcohol on glucose and how to use it safely  Goals:  Follow Diabetes Meal Plan as instructed  Aim to spread carbs evenly throughout the day  Aim for 3 meals per day and snacks as needed Include lean protein foods to meals/snacks  Monitor glucose levels as instructed by your doctor   Follow-Up Plan:  Attend Core 3  Work towards following your personal food plan.

## 2020-01-06 ENCOUNTER — Other Ambulatory Visit: Payer: Self-pay

## 2020-01-06 ENCOUNTER — Encounter: Payer: Medicare HMO | Admitting: Dietician

## 2020-01-06 DIAGNOSIS — E1169 Type 2 diabetes mellitus with other specified complication: Secondary | ICD-10-CM

## 2020-01-06 DIAGNOSIS — Z794 Long term (current) use of insulin: Secondary | ICD-10-CM | POA: Diagnosis not present

## 2020-01-07 DIAGNOSIS — M25561 Pain in right knee: Secondary | ICD-10-CM | POA: Diagnosis not present

## 2020-01-08 ENCOUNTER — Encounter: Payer: Self-pay | Admitting: Dietician

## 2020-01-08 NOTE — Progress Notes (Signed)
Patient was seen on 01/06/2020 for the third of a series of three diabetes self-management courses at [facility].   Patrick Buchanan the amount of activity recommended for healthy living . Describe activities suitable for individual needs . Identify ways to regularly incorporate activity into daily life . Identify barriers to activity and ways to over come these barriers  Identify diabetes medications being personally used and their primary action for lowering glucose and possible side effects . Describe role of stress on blood glucose and develop strategies to address psychosocial issues . Identify diabetes complications and ways to prevent them  Explain how to manage diabetes during illness . Evaluate success in meeting personal goal . Establish 2-3 goals that they will plan to diligently work on  Goals:   I will count my carb choices at most meals and snacks  I will be active 30 minutes or more 5 times a week or 15 minutes 10 times per week  I will take my diabetes medications as scheduled if prescribed  I will eat less unhealthy fats by eating less fat in general  I will test my glucose at least when my physician instructs.  Your patient has identified these potential barriers to change:  None  Your patient has identified their diabetes self-care support plan as  Family Support  American Diabetes Association Website  Plan:  Attend Support Group as desired

## 2020-01-30 ENCOUNTER — Other Ambulatory Visit: Payer: Self-pay

## 2020-01-30 ENCOUNTER — Other Ambulatory Visit: Payer: Medicare HMO

## 2020-01-30 DIAGNOSIS — E782 Mixed hyperlipidemia: Secondary | ICD-10-CM | POA: Diagnosis not present

## 2020-01-30 DIAGNOSIS — E559 Vitamin D deficiency, unspecified: Secondary | ICD-10-CM | POA: Diagnosis not present

## 2020-01-30 DIAGNOSIS — E781 Pure hyperglyceridemia: Secondary | ICD-10-CM

## 2020-01-30 DIAGNOSIS — E1169 Type 2 diabetes mellitus with other specified complication: Secondary | ICD-10-CM | POA: Diagnosis not present

## 2020-01-31 LAB — HEMOGLOBIN A1C
Est. average glucose Bld gHb Est-mCnc: 114 mg/dL
Hgb A1c MFr Bld: 5.6 % (ref 4.8–5.6)

## 2020-01-31 LAB — VITAMIN D 25 HYDROXY (VIT D DEFICIENCY, FRACTURES): Vit D, 25-Hydroxy: 67.2 ng/mL (ref 30.0–100.0)

## 2020-01-31 LAB — LIPID PANEL
Chol/HDL Ratio: 2.9 ratio (ref 0.0–5.0)
Cholesterol, Total: 127 mg/dL (ref 100–199)
HDL: 44 mg/dL (ref >39)
LDL Chol Calc (NIH): 64 mg/dL (ref 0–99)
Triglycerides: 105 mg/dL (ref 0–149)
VLDL Cholesterol Cal: 19 mg/dL (ref 5–40)

## 2020-01-31 LAB — MICROALBUMIN / CREATININE URINE RATIO
Creatinine, Urine: 127.8 mg/dL
Microalb/Creat Ratio: 10 mg/g creat (ref 0–29)
Microalbumin, Urine: 13.3 ug/mL

## 2020-02-04 ENCOUNTER — Encounter: Payer: Self-pay | Admitting: Family Medicine

## 2020-02-04 ENCOUNTER — Ambulatory Visit (INDEPENDENT_AMBULATORY_CARE_PROVIDER_SITE_OTHER): Payer: Medicare HMO | Admitting: Family Medicine

## 2020-02-04 ENCOUNTER — Other Ambulatory Visit: Payer: Self-pay

## 2020-02-04 VITALS — BP 117/72 | HR 75 | Temp 98.5°F | Ht 72.0 in | Wt 215.0 lb

## 2020-02-04 DIAGNOSIS — E781 Pure hyperglyceridemia: Secondary | ICD-10-CM

## 2020-02-04 DIAGNOSIS — E1169 Type 2 diabetes mellitus with other specified complication: Secondary | ICD-10-CM | POA: Diagnosis not present

## 2020-02-04 DIAGNOSIS — E782 Mixed hyperlipidemia: Secondary | ICD-10-CM | POA: Diagnosis not present

## 2020-02-04 MED ORDER — ROSUVASTATIN CALCIUM 5 MG PO TABS: 5.0000 mg | ORAL_TABLET | Freq: Every day | ORAL | Status: DC

## 2020-02-04 NOTE — Progress Notes (Signed)
Telehealth office visit note for Patrick Buchanan, D.O- at Primary Care at [facility]   I connected with current patient today and verified that I am speaking with the correct person   . Location of the patient: Home . Location of the provider: Office - This visit type was conducted due to national recommendations for restrictions regarding the COVID-19 Pandemic (e.g. social distancing) in an effort to limit this patient's exposure and mitigate transmission in our community.    - No physical exam could be performed with this format, beyond that communicated to [place] by the patient/ family members as noted.   - Additionally my office staff/ schedulers were to discuss with the patient that there may be a monetary charge related to this service, depending on their medical insurance.  My understanding is that patient understood and consented to proceed.     _________________________________________________________________________________   History of Present Illness:  I, Patrick Buchanan, am serving as Education administrator for Patrick Buchanan.  Says today "I am just lovely."  - Weight Loss since January 2021 States he was "255 lbs before I fixed it."  He is now down to 215 lbs.    Says "I'm sort of a pro at losing and gaining weight."  In addition to adjusting his eating habits, says since the weather has broken, his spouse has been "working him hard."  Feels the "most amazing thing is if you look at me."  Says he feels he truly looks better and more "presentable" after his weight loss and implementing his healthier habits.    He's been excited to see how loose his old clothes have become, noting "there's room in there to fit a whole 'nother person!"   In the past he weighed 170-175, and his current goal is around 190-195 lbs.  Says "my struggle on weight is not my mouth, it's between my ears."  - Blood Pressure Feels his blood pressure is well controlled at home.  Says his blood pressure  varies, going as low as 106/60-something, and as high as an 80 on the bottom.  HPI:   Diabetes Mellitus:  Says to control his blood sugar, he has followed the recommendations of the diabetic nutritionist.  Home glucose readings:  A1c of 5.6.   - Patient reports good compliance with therapy plan: medication and/or lifestyle modification  - His denies acute concerns or problems related to treatment plan  - He denies new concerns.  Denies polyuria/polydipsia, hypo/ hyperglycemia symptoms.  Denies new onset of: chest pain, exercise intolerance, shortness of breath, dizziness, visual changes, headache, lower extremity swelling or claudication.   Last A1C in the office was:  Lab Results  Component Value Date   HGBA1C 5.6 01/30/2020   HGBA1C 6.5 (H) 11/07/2019   HGBA1C 5.8 (H) 11/28/2018   Lab Results  Component Value Date   LDLCALC 64 01/30/2020   CREATININE 0.93 11/07/2019   BP Readings from Last 3 Encounters:  02/04/20 117/72  11/18/19 127/74  10/29/19 130/70   Wt Readings from Last 3 Encounters:  02/04/20 215 lb (97.5 kg)  12/23/19 239 lb (108.4 kg)  11/18/19 250 lb (113.4 kg)   HPI:  Hyperlipidemia:  69 y.o. male here for cholesterol follow-up.   - Patient reports good compliance with treatment plan of:  medication and/ or lifestyle management.    - Patient denies any acute concerns or problems with management plan   - He denies new onset of: myalgias, arthralgias, increased fatigue more than  normal, chest pains, exercise intolerance, shortness of breath, dizziness, visual changes, headache, lower extremity swelling or claudication.   Most recent cholesterol panel was:  Lab Results  Component Value Date   CHOL 127 01/30/2020   HDL 44 01/30/2020   LDLCALC 64 01/30/2020   TRIG 105 01/30/2020   CHOLHDL 2.9 01/30/2020   Hepatic Function Latest Ref Rng & Units 11/07/2019 11/28/2018  Total Protein 6.0 - 8.5 g/dL 6.6 6.7  Albumin 3.8 - 4.8 g/dL 4.1 4.2  AST 0 - 40 IU/L  25 19  ALT 0 - 44 IU/L 35 25  Alk Phosphatase 39 - 117 IU/L 107 93  Total Bilirubin 0.0 - 1.2 mg/dL 0.4 0.5       No flowsheet data found.  Depression screen [facility] 2/9 02/04/2020 12/23/2019 11/18/2019 10/29/2019 08/11/2019  Decreased Interest 0 0 0 0 0  Down, Depressed, Hopeless 0 0 0 1 0  PHQ - 2 Score 0 0 0 1 0  Altered sleeping 0 - 0 0 0  Tired, decreased energy 0 - 0 0 0  Change in appetite 0 - 1 0 0  Feeling bad or failure about yourself  0 - 0 0 0  Trouble concentrating 0 - 0 0 0  Moving slowly or fidgety/restless 0 - 0 0 0  Suicidal thoughts 0 - 0 0 0  PHQ-9 Score 0 - 1 1 0  Difficult doing work/chores - - Not difficult at all Not difficult at all Not difficult at all      Impression and Recommendations:     1. Type 2 diabetes mellitus with other specified complication, without long-term current use of insulin ([place])   2. Mixed diabetic hyperlipidemia associated with type 2 diabetes mellitus ([place])   3. Type 2 diabetes mellitus with hypertriglyceridemia ([place])      - Last seen 11/18/2019.  - Reviewed recent lab work (01/30/2020) in depth with patient today.  All lab work within normal limits unless otherwise noted.  Extensive education provided and all questions answered.  Blood Pressure - Stable at this time, well-controlled.  - Discussed goal of under 140/90, ideally 120/80 or less. - Advised patient to watch for any sign of dizziness or lightheadedness.  - Continue management as established. - Encouraged patient to continue regular ambulatory BP monitoring.  - Will continue to monitor.   Type 2 DM - Diet Controlled - Stable at this time, much improved with lifestyle habits.  - A1c most recently is 5.6, at goal, down from 6.5 prior. - Pt will continue current treatment regimen.  See med list.  - Counseled patient on pathophysiology of disease and discussed various treatment options, which always includes dietary and lifestyle modification as first line.    -  Importance of low carb, heart-healthy diet discussed with patient in addition to regular aerobic exercise of 70mn 5d/week or more.   - Check FBS and 2 hours after the biggest meal of your day.  Keep log and bring in next OV for my review.     - Also told patient if you ever feel poorly, please check your blood pressure and blood sugar, as one or the other could be the cause of your symptoms.  - Pt reminded about need for yearly eye and foot exams.  Told patient to make appt.for diabetic eye exam, CMAs here will do foot exams  - We will continue to monitor and re-check in 4 months.   Mixed Diabetic Hyperlipidemia, Type 2 DM with Hypertriglyceridemia Last FLP  obtained 5 days ago:  Triglycerides = 105, WNL, down from 455 prior. HDL = 44, WNL, up from 31 prior. LDL = 64, WNL, stable from 60 prior.  - Cholesterol levels are at goal, much improved from prior.  - Per patient request, will reduce dose of statin in half. - Patient prefers to try to cut his current pills in half.  If patient cannot cut existing pills in half, he may also consider discontinuing statin if desired given his improvements in lifestyle habits.  - Patient will call clinic if he has difficulty cutting his pill in half.  - STRONGLY encouraged patient to continue with prudent lifestyle changes.  - Ongoing dietary changes such as low saturated & trans fat diets for hyperlipidemia and low carb diets for hypertriglyceridemia discussed with patient.    - Encouraged patient to follow AHA guidelines for regular exercise and also engage in weight loss if BMI above 25.   - We will continue to monitor and re-check in 4 months.   Vitamin D Deficiency - Last checked 5 days ago and measured at 67.2, up from 31.9 prior.  - Told patient to discontinue 50,000 IU's daily. - Begin 5000 IU's daily in the summer.  See med list.  - Will continue to monitor and re-check in 4 months as discussed.   BMI Counseling - Body mass index  is 29.16 kg/m Explained to patient what BMI refers to, and what it means medically.    Told patient to think about it as a "medical risk stratification measurement" and how increasing BMI is associated with increasing risk/ or worsening state of various diseases such as hypertension, hyperlipidemia, diabetes, premature OA, depression etc.  American Heart Association guidelines for healthy diet, basically Mediterranean diet, and exercise guidelines of 30 minutes 5 days per week or more discussed in detail.  Health counseling performed.  All questions answered.   Health Counseling & Preventative Maintenance - Advised patient to continue working toward exercising and prudent weight loss to improve overall mental, physical, and emotional health.    - Reviewed the "spokes of the wheel" of mood and wellbeing.  Stressed the importance of ongoing prudent habits, including regular exercise, appropriate sleep hygiene, healthful dietary habits, and prayer/meditation to relax.  - Encouraged patient to engage in daily physical activity as tolerated, especially a formal exercise routine.  Recommended that the patient eventually strive for at least 150 minutes of moderate cardiovascular activity per week according to guidelines established by the [facility].   - Healthy dietary habits encouraged, including low-carb, and high amounts of lean protein in diet.   - Patient should also consume adequate amounts of water.    - As part of my medical decision making, I reviewed the following data within the [place] History obtained from pt /family, CMA notes reviewed and incorporated if applicable, Labs reviewed, Radiograph/ tests reviewed if applicable and OV notes from prior OV's with me, as well as other specialists she/he has seen since seeing me last, were all reviewed and used in my medical decision making process today.    - Additionally, when appropriate, discussion had with patient regarding  our treatment plan, and their biases/concerns about that plan were used in my medical decision making today.    - The patient agreed with the plan and demonstrated an understanding of the instructions.   No barriers to understanding were identified.     - The patient was advised to call back or seek an in-person evaluation  if the symptoms worsen or if the condition fails to improve as anticipated.   Return for re-check A1c, FLP, Vitamin D in 4 months.    No orders of the defined types were placed in this encounter.   Meds ordered this encounter  Medications  . rosuvastatin (CRESTOR) 5 MG tablet    Sig: Take 1 tablet (5 mg total) by mouth at bedtime.    Medications Discontinued During This Encounter  Medication Reason  . cholecalciferol (VITAMIN D3) 25 MCG (1000 UT) tablet   . rosuvastatin (CRESTOR) 10 MG tablet      Time spent on visit was 29+ minutes.   Note:  This note was prepared with assistance of Dragon voice recognition software. Occasional wrong-word or sound-a-like substitutions may have occurred due to the inherent limitations of voice recognition software.  The [place] was signed into law in 2016 which includes the topic of electronic health records.  This provides immediate access to information in MyChart.  This includes consultation notes, operative notes, office notes, lab results and pathology reports.  If you have any questions about what you read please let us know at your next visit or call us at the office.  We are right here with you.  This document serves as a record of services personally performed by Patrick Dance, DO. It was created on her behalf by Patrick Buchanan, a trained medical scribe. The creation of this record is based on the scribe's personal observations and the provider's statements to them.    The above documentation from Patrick Buchanan, medical scribe, has been reviewed by Marjory Sneddon,  D.O.    __________________________________________________________________________________     Patient Care Team    Relationship Specialty Notifications Start End  Patrick Dance, DO PCP - General Family Medicine  10/28/18      -Vitals obtained; medications/ allergies reconciled;  personal medical, social, Sx etc.histories were updated by CMA, reviewed by me and are reflected in chart   Patient Active Problem List   Diagnosis Date Noted  . Prediabetes 08/11/2019  . Mixed diabetic hyperlipidemia associated with type 2 diabetes mellitus ([place]) 11/18/2019  . Diabetes mellitus ([place]) 11/18/2019  . Counseling on health promotion and disease prevention 11/18/2019  . History of smoking for 6-10 years 10/29/2019  . Pain in right knee 08/25/2019  . Vitamin D insufficiency 08/11/2019  . Hypertriglyceridemia 08/11/2019  . Serum sodium elevated 08/11/2019  . dysthymia 10/28/2018  . (BMI 30.0-34.9) 10/28/2018  . Elevated blood pressure reading- no dx HTN 10/28/2018  . Family history of diabetes mellitus (DM) 10/28/2018     Current Meds  Medication Sig  . chlorpheniramine (CHLOR-TRIMETON) 4 MG tablet Take 4 mg by mouth 2 (two) times daily as needed for allergies.  . Cholecalciferol 125 MCG (5000 UT) TABS Take 1 tablet by mouth daily.  . diphenhydrAMINE (BENADRYL) 25 mg capsule Take 25 mg by mouth every 6 (six) hours as needed.  Marland Kitchen ibuprofen (ADVIL) 200 MG tablet Take 200 mg by mouth every 6 (six) hours as needed.  . Multiple Vitamin (MULTIVITAMIN) tablet Take 1 tablet by mouth daily.  . naproxen sodium (ALEVE) 220 MG tablet Take 220 mg by mouth daily as needed.  . rosuvastatin (CRESTOR) 5 MG tablet Take 1 tablet (5 mg total) by mouth at bedtime.  . Vitamin D, Ergocalciferol, (DRISDOL) 1.25 MG (50000 UNIT) CAPS capsule Take one tablet wkly  . [DISCONTINUED] cholecalciferol (VITAMIN D3) 25 MCG (1000 UT) tablet Take 5,000 Units by mouth daily.   . [  DISCONTINUED] escitalopram (LEXAPRO) 20  MG tablet Take 1 tablet (20 mg total) by mouth daily.  . [DISCONTINUED] rosuvastatin (CRESTOR) 10 MG tablet Take 1 tablet (10 mg total) by mouth at bedtime.     Allergies:  No Known Allergies   ROS:  See above HPI for pertinent positives and negatives   Objective:   Blood pressure 117/72, pulse 75, temperature 98.5 F (36.9 C), temperature source Oral, height 6' (1.829 m), weight 215 lb (97.5 kg).  (if some vitals are omitted, this means that patient was UNABLE to obtain them even though they were asked to get them prior to OV today.  They were asked to call us at their earliest convenience with these once obtained. ) General: A & O * 3; sounds in no acute distress; in usual state of health.  Skin: Pt confirms warm and dry extremities and pink fingertips HEENT: Pt confirms lips non-cyanotic Chest: Patient confirms normal chest excursion and movement Respiratory: speaking in full sentences, no conversational dyspnea; patient confirms no use of accessory muscles Psych: insight appears good, mood- appears full

## 2020-02-05 ENCOUNTER — Other Ambulatory Visit: Payer: Self-pay | Admitting: Family Medicine

## 2020-02-05 DIAGNOSIS — F341 Dysthymic disorder: Secondary | ICD-10-CM

## 2020-02-05 MED ORDER — ESCITALOPRAM OXALATE 20 MG PO TABS: 20.0000 mg | ORAL_TABLET | Freq: Every day | ORAL | 0 refills | Status: DC

## 2020-02-05 NOTE — Telephone Encounter (Signed)
Patient called states he forgot to request refill on :   escitalopram (LEXAPRO) 20 MG tablet [id number]   Order Details Dose: 20 mg Route: Oral Frequency: Daily  Dispense Quantity: 90 tablet Refills: 1       Sig: Take 1 tablet (20 mg total) by mouth daily.       ---Forwarding request to med asst that if approved send refill order to :   [facility], [place] [phone] (Phone) [phone] (Fax)   --glh

## 2020-02-05 NOTE — Telephone Encounter (Signed)
Refill sent to pharmacy. AS, CMA 

## 2020-04-28 DIAGNOSIS — M25561 Pain in right knee: Secondary | ICD-10-CM | POA: Diagnosis not present

## 2020-05-14 ENCOUNTER — Other Ambulatory Visit: Payer: Self-pay | Admitting: Physician Assistant

## 2020-05-18 ENCOUNTER — Telehealth: Payer: Self-pay | Admitting: Physician Assistant

## 2020-05-18 MED ORDER — ROSUVASTATIN CALCIUM 5 MG PO TABS: 5.0000 mg | ORAL_TABLET | Freq: Every day | ORAL | 0 refills | Status: DC

## 2020-05-18 NOTE — Telephone Encounter (Signed)
Refill sent to requested pharmacy. AS, CMA 

## 2020-05-18 NOTE — Telephone Encounter (Signed)
Patient is requesting a refill of his Crestor, if approved please send to [facility]

## 2020-05-18 NOTE — Addendum Note (Signed)
Addended by: Mickel Crow on: 05/18/2020 11:50 AM   Modules accepted: Orders

## 2020-05-27 ENCOUNTER — Other Ambulatory Visit: Payer: Self-pay

## 2020-05-27 ENCOUNTER — Other Ambulatory Visit: Payer: Medicare HMO

## 2020-05-27 DIAGNOSIS — E782 Mixed hyperlipidemia: Secondary | ICD-10-CM | POA: Diagnosis not present

## 2020-05-27 DIAGNOSIS — E1169 Type 2 diabetes mellitus with other specified complication: Secondary | ICD-10-CM

## 2020-05-27 DIAGNOSIS — E781 Pure hyperglyceridemia: Secondary | ICD-10-CM

## 2020-05-27 DIAGNOSIS — E559 Vitamin D deficiency, unspecified: Secondary | ICD-10-CM

## 2020-05-27 DIAGNOSIS — E119 Type 2 diabetes mellitus without complications: Secondary | ICD-10-CM

## 2020-05-28 LAB — LIPID PANEL
Chol/HDL Ratio: 3.2 ratio (ref 0.0–5.0)
Cholesterol, Total: 156 mg/dL (ref 100–199)
HDL: 49 mg/dL (ref >39)
LDL Chol Calc (NIH): 91 mg/dL (ref 0–99)
Triglycerides: 85 mg/dL (ref 0–149)
VLDL Cholesterol Cal: 16 mg/dL (ref 5–40)

## 2020-05-28 LAB — VITAMIN D 25 HYDROXY (VIT D DEFICIENCY, FRACTURES): Vit D, 25-Hydroxy: 64.2 ng/mL (ref 30.0–100.0)

## 2020-05-28 LAB — HEMOGLOBIN A1C
Est. average glucose Bld gHb Est-mCnc: 117 mg/dL
Hgb A1c MFr Bld: 5.7 % — ABNORMAL HIGH (ref 4.8–5.6)

## 2020-06-03 ENCOUNTER — Encounter: Payer: Self-pay | Admitting: Physician Assistant

## 2020-06-03 ENCOUNTER — Telehealth (INDEPENDENT_AMBULATORY_CARE_PROVIDER_SITE_OTHER): Payer: Medicare HMO | Admitting: Physician Assistant

## 2020-06-03 ENCOUNTER — Other Ambulatory Visit: Payer: Self-pay

## 2020-06-03 VITALS — BP 120/74 | Temp 98.6°F | Ht 72.0 in | Wt 210.0 lb

## 2020-06-03 DIAGNOSIS — E782 Mixed hyperlipidemia: Secondary | ICD-10-CM

## 2020-06-03 DIAGNOSIS — E1169 Type 2 diabetes mellitus with other specified complication: Secondary | ICD-10-CM | POA: Diagnosis not present

## 2020-06-03 DIAGNOSIS — E559 Vitamin D deficiency, unspecified: Secondary | ICD-10-CM | POA: Diagnosis not present

## 2020-06-03 NOTE — Progress Notes (Signed)
Telehealth office visit note for Lorrene Reid, PA-C- at Primary Care at [facility]   I connected with current patient today by telephone and verified that I am speaking with the correct person   . Location of the patient: Home . Location of the provider: Office - This visit type was conducted due to national recommendations for restrictions regarding the COVID-19 Pandemic (e.g. social distancing) in an effort to limit this patient's exposure and mitigate transmission in our community.    - No physical exam could be performed with this format, beyond that communicated to [place] by the patient/ family members as noted.   - Additionally my office staff/ schedulers were to discuss with the patient that there may be a monetary charge related to this service, depending on their medical insurance.  My understanding is that patient understood and consented to proceed.     _________________________________________________________________________________   History of Present Illness: Pt calls in to follow-up on diabetes mellitus and hyperlipidemia. Pt has no acute concerns today.  Diabetes: Pt denies increased urination or thirst. Pt reports he has made dietary changes by reducing white bread and increasing whole grains. He monitors carbohydrates and glucose.  HLD: Pt taking medication as directed without side effects. Pt reports he has increased his physical activity, eating more greens and whole grains.     No flowsheet data found.  Depression screen [facility] 2/9 06/03/2020 02/04/2020 12/23/2019 11/18/2019 10/29/2019  Decreased Interest 0 0 0 0 0  Down, Depressed, Hopeless 0 0 0 0 1  PHQ - 2 Score 0 0 0 0 1  Altered sleeping 0 0 - 0 0  Tired, decreased energy 0 0 - 0 0  Change in appetite 0 0 - 1 0  Feeling bad or failure about yourself  0 0 - 0 0  Trouble concentrating 0 0 - 0 0  Moving slowly or fidgety/restless 0 0 - 0 0  Suicidal thoughts 0 0 - 0 0  PHQ-9 Score 0 0 - 1 1  Difficult doing  work/chores - - - Not difficult at all Not difficult at all      Impression and Recommendations:     1. Type 2 diabetes mellitus with other specified complication, without long-term current use of insulin ([place])   2. Mixed diabetic hyperlipidemia associated with type 2 diabetes mellitus ([place])   3. Vitamin D insufficiency     T2DM: -Most recent A1c 5.7, at goal -Continue to follow low carbohydrate and glucose diet. -Continue physical activity. -Will continue to monitor.  Mixed diabetic hyperlipidemia associated with type 2 diabetes mellitus: -Most recent lipid panel wnl -Continue Crestor 5 mg. -Continue heart healthy diet.  Vit D Insufficiency:  -Most recent Vit D wnl -Continue Vit D supplement. -Will continue to monitor and recheck with MCW.  - As part of my medical decision making, I reviewed the following data within the [place] History obtained from pt /family, CMA notes reviewed and incorporated if applicable, Labs reviewed, Radiograph/ tests reviewed if applicable and OV notes from prior OV's with me, as well as any other specialists she/he has seen since seeing me last, were all reviewed and used in my medical decision making process today.    - Additionally, when appropriate, discussion had with patient regarding our treatment plan, and their biases/concerns about that plan were used in my medical decision making today.    - The patient agreed with the plan and demonstrated an understanding of the instructions.  No barriers to understanding were identified.     - The patient was advised to call back or seek an in-person evaluation if the symptoms worsen or if the condition fails to improve as anticipated.   Return for MCW and FBW in 5 months.    No orders of the defined types were placed in this encounter.   No orders of the defined types were placed in this encounter.   There are no discontinued medications.     Time spent on visit  including pre-visit chart review and post-visit care was 11 minutes.      The [place] was signed into law in 2016 which includes the topic of electronic health records.  This provides immediate access to information in MyChart.  This includes consultation notes, operative notes, office notes, lab results and pathology reports.  If you have any questions about what you read please let us know at your next visit or call us at the office.  We are right here with you.   __________________________________________________________________________________     Patient Care Team    Relationship Specialty Notifications Start End  Lorrene Reid, [place] PCP - General   02/22/20      -Vitals obtained; medications/ allergies reconciled;  personal medical, social, Sx etc.histories were updated by CMA, reviewed by me and are reflected in chart   Patient Active Problem List   Diagnosis Date Noted  . Mixed diabetic hyperlipidemia associated with type 2 diabetes mellitus ([place]) 11/18/2019  . Diabetes mellitus ([place]) 11/18/2019  . Counseling on health promotion and disease prevention 11/18/2019  . History of smoking for 6-10 years 10/29/2019  . Pain in right knee 08/25/2019  . Vitamin D insufficiency 08/11/2019  . Hypertriglyceridemia 08/11/2019  . Prediabetes 08/11/2019  . Serum sodium elevated 08/11/2019  . dysthymia 10/28/2018  . (BMI 30.0-34.9) 10/28/2018  . Elevated blood pressure reading- no dx HTN 10/28/2018  . Family history of diabetes mellitus (DM) 10/28/2018     Current Meds  Medication Sig  . chlorpheniramine (CHLOR-TRIMETON) 4 MG tablet Take 4 mg by mouth 2 (two) times daily as needed for allergies.  . Cholecalciferol 125 MCG (5000 UT) TABS Take 1 tablet by mouth daily.  . diphenhydrAMINE (BENADRYL) 25 mg capsule Take 25 mg by mouth every 6 (six) hours as needed.  Marland Kitchen escitalopram (LEXAPRO) 20 MG tablet Take 1 tablet (20 mg total) by mouth daily.  Marland Kitchen ibuprofen (ADVIL)  200 MG tablet Take 200 mg by mouth every 6 (six) hours as needed.  . Multiple Vitamin (MULTIVITAMIN) tablet Take 1 tablet by mouth daily.  . naproxen sodium (ALEVE) 220 MG tablet Take 220 mg by mouth daily as needed.  . rosuvastatin (CRESTOR) 5 MG tablet Take 1 tablet (5 mg total) by mouth at bedtime.     Allergies:  No Known Allergies   ROS:  See above HPI for pertinent positives and negatives   Objective:   Blood pressure 120/74, temperature 98.6 F (37 C), height 6' (1.829 m), weight 210 lb (95.3 kg).  (if some vitals are omitted, this means that patient was UNABLE to obtain them even though they were asked to get them prior to OV today.  They were asked to call us at their earliest convenience with these once obtained. ) General: A & O * 3; sounds in no acute distress; in usual state of health.  Respiratory: speaking in full sentences, no conversational dyspnea;  Psych: insight appears good, mood- appears full

## 2020-08-25 ENCOUNTER — Other Ambulatory Visit: Payer: Self-pay | Admitting: Physician Assistant

## 2020-10-31 ENCOUNTER — Other Ambulatory Visit: Payer: Self-pay | Admitting: Physician Assistant

## 2020-10-31 DIAGNOSIS — E782 Mixed hyperlipidemia: Secondary | ICD-10-CM

## 2020-10-31 DIAGNOSIS — E559 Vitamin D deficiency, unspecified: Secondary | ICD-10-CM

## 2020-10-31 DIAGNOSIS — E1169 Type 2 diabetes mellitus with other specified complication: Secondary | ICD-10-CM

## 2020-10-31 DIAGNOSIS — Z0000 Encounter for general adult medical examination without abnormal findings: Secondary | ICD-10-CM

## 2020-11-03 ENCOUNTER — Other Ambulatory Visit: Payer: Self-pay

## 2020-11-03 ENCOUNTER — Other Ambulatory Visit: Payer: Medicare HMO

## 2020-11-03 DIAGNOSIS — Z0000 Encounter for general adult medical examination without abnormal findings: Secondary | ICD-10-CM | POA: Diagnosis not present

## 2020-11-03 DIAGNOSIS — E1169 Type 2 diabetes mellitus with other specified complication: Secondary | ICD-10-CM

## 2020-11-03 DIAGNOSIS — E559 Vitamin D deficiency, unspecified: Secondary | ICD-10-CM

## 2020-11-03 DIAGNOSIS — E782 Mixed hyperlipidemia: Secondary | ICD-10-CM | POA: Diagnosis not present

## 2020-11-04 LAB — COMPREHENSIVE METABOLIC PANEL
ALT: 35 IU/L (ref 0–44)
AST: 28 IU/L (ref 0–40)
Albumin/Globulin Ratio: 1.6 (ref 1.2–2.2)
Albumin: 4.4 g/dL (ref 3.8–4.8)
Alkaline Phosphatase: 97 IU/L (ref 44–121)
BUN/Creatinine Ratio: 18 (ref 10–24)
BUN: 18 mg/dL (ref 8–27)
Bilirubin Total: 0.5 mg/dL (ref 0.0–1.2)
CO2: 25 mmol/L (ref 20–29)
Calcium: 9.4 mg/dL (ref 8.6–10.2)
Chloride: 103 mmol/L (ref 96–106)
Creatinine, Ser: 0.98 mg/dL (ref 0.76–1.27)
GFR calc Af Amer: 92 mL/min/{1.73_m2} (ref >59)
GFR calc non Af Amer: 79 mL/min/{1.73_m2} (ref >59)
Globulin, Total: 2.7 g/dL (ref 1.5–4.5)
Glucose: 123 mg/dL — ABNORMAL HIGH (ref 65–99)
Potassium: 4.9 mmol/L (ref 3.5–5.2)
Sodium: 141 mmol/L (ref 134–144)
Total Protein: 7.1 g/dL (ref 6.0–8.5)

## 2020-11-04 LAB — CBC
Hematocrit: 43.8 % (ref 37.5–51.0)
Hemoglobin: 15 g/dL (ref 13.0–17.7)
MCH: 31 pg (ref 26.6–33.0)
MCHC: 34.2 g/dL (ref 31.5–35.7)
MCV: 91 fL (ref 79–97)
Platelets: 263 10*3/uL (ref 150–450)
RBC: 4.84 x10E6/uL (ref 4.14–5.80)
RDW: 12.4 % (ref 11.6–15.4)
WBC: 5.9 10*3/uL (ref 3.4–10.8)

## 2020-11-04 LAB — LIPID PANEL
Chol/HDL Ratio: 3.8 ratio (ref 0.0–5.0)
Cholesterol, Total: 165 mg/dL (ref 100–199)
HDL: 43 mg/dL (ref >39)
LDL Chol Calc (NIH): 92 mg/dL (ref 0–99)
Triglycerides: 173 mg/dL — ABNORMAL HIGH (ref 0–149)
VLDL Cholesterol Cal: 30 mg/dL (ref 5–40)

## 2020-11-04 LAB — TSH: TSH: 2.27 u[IU]/mL (ref 0.450–4.500)

## 2020-11-04 LAB — VITAMIN D 25 HYDROXY (VIT D DEFICIENCY, FRACTURES): Vit D, 25-Hydroxy: 62.2 ng/mL (ref 30.0–100.0)

## 2020-11-04 LAB — HEMOGLOBIN A1C
Est. average glucose Bld gHb Est-mCnc: 143 mg/dL
Hgb A1c MFr Bld: 6.6 % — ABNORMAL HIGH (ref 4.8–5.6)

## 2020-11-10 ENCOUNTER — Telehealth: Payer: Self-pay | Admitting: Physician Assistant

## 2020-11-10 ENCOUNTER — Ambulatory Visit: Payer: Medicare HMO | Admitting: Physician Assistant

## 2020-11-10 ENCOUNTER — Encounter: Payer: Self-pay | Admitting: Physician Assistant

## 2020-11-10 ENCOUNTER — Other Ambulatory Visit: Payer: Self-pay

## 2020-11-10 VITALS — BP 140/80 | HR 70 | Temp 98.6°F | Ht 72.0 in | Wt 230.0 lb

## 2020-11-10 DIAGNOSIS — E1169 Type 2 diabetes mellitus with other specified complication: Secondary | ICD-10-CM | POA: Diagnosis not present

## 2020-11-10 DIAGNOSIS — Z0000 Encounter for general adult medical examination without abnormal findings: Secondary | ICD-10-CM

## 2020-11-10 DIAGNOSIS — F341 Dysthymic disorder: Secondary | ICD-10-CM

## 2020-11-10 DIAGNOSIS — Z1211 Encounter for screening for malignant neoplasm of colon: Secondary | ICD-10-CM | POA: Diagnosis not present

## 2020-11-10 DIAGNOSIS — E782 Mixed hyperlipidemia: Secondary | ICD-10-CM | POA: Diagnosis not present

## 2020-11-10 DIAGNOSIS — I8390 Asymptomatic varicose veins of unspecified lower extremity: Secondary | ICD-10-CM | POA: Diagnosis not present

## 2020-11-10 MED ORDER — ESCITALOPRAM OXALATE 20 MG PO TABS: 20.0000 mg | ORAL_TABLET | Freq: Every day | ORAL | 0 refills | Status: DC

## 2020-11-10 MED ORDER — ROSUVASTATIN CALCIUM 10 MG PO TABS: 10.0000 mg | ORAL_TABLET | Freq: Every day | ORAL | 3 refills | Status: DC

## 2020-11-10 NOTE — Progress Notes (Signed)
Virtual Visit via Telephone Note:  I connected with [name] by telephone and verified that I am speaking with the correct person using two identifiers.    I discussed the limitations, risks, security and privacy concerns for performing an evaluation and management service by telephone and the availability of in person appointments. The staff discussed with patient that there may be a patient responsible charge related to this service. The patient expressed understanding and agreed to proceed.   Location of Patient- Home Location of Provider- Office    Subjective:   Patrick Buchanan is a 70 y.o. male who presents for Medicare Annual/Subsequent preventive examination.  Review of Systems    General:   No F/C, wt loss Pulm:   No DIB, SOB, pleuritic chest pain Card:  No CP, palpitations Abd:  No n/v/d or pain Ext:  No inc edema from baseline     Objective:    Today's Vitals   11/10/20 0940  BP: 140/80  Pulse: 70  Temp: 98.6 F (37 C)  Weight: 230 lb (104.3 kg)  Height: 6' (1.829 m)   Body mass index is 31.19 kg/m.  Advanced Directives 12/23/2019 01/01/2019 10/28/2018  Does Patient Have a Medical Advance Directive? Yes Yes Yes  Type of Advance Directive - Living will;Healthcare Power of Dwight  Does patient want to make changes to medical advance directive? - - No - Patient declined  Copy of [place] in Chart? - - No - copy requested    Current Medications (verified) Outpatient Encounter Medications as of 11/10/2020  Medication Sig  . chlorpheniramine (CHLOR-TRIMETON) 4 MG tablet Take 4 mg by mouth 2 (two) times daily as needed for allergies.  . Cholecalciferol 125 MCG (5000 UT) TABS Take 1 tablet by mouth daily.  Marland Kitchen ibuprofen (ADVIL) 200 MG tablet Take 200 mg by mouth every 6 (six) hours as needed.  . Multiple Vitamin (MULTIVITAMIN) tablet Take 1 tablet by mouth daily.  . rosuvastatin (CRESTOR) 10 MG tablet Take  1 tablet (10 mg total) by mouth daily.  . Vitamin D, Ergocalciferol, (DRISDOL) 1.25 MG (50000 UNIT) CAPS capsule Take one tablet wkly  . [DISCONTINUED] escitalopram (LEXAPRO) 20 MG tablet Take 1 tablet (20 mg total) by mouth daily.  . diphenhydrAMINE (BENADRYL) 25 mg capsule Take 25 mg by mouth every 6 (six) hours as needed. (Patient not taking: Reported on 11/10/2020)  . naproxen sodium (ALEVE) 220 MG tablet Take 220 mg by mouth daily as needed.  . predniSONE (DELTASONE) 10 MG tablet   . [DISCONTINUED] rosuvastatin (CRESTOR) 5 MG tablet TAKE 1 TABLET (5 MG TOTAL) BY MOUTH AT BEDTIME. (Patient not taking: Reported on 11/10/2020)   No facility-administered encounter medications on file as of 11/10/2020.    Allergies (verified) Patient has no known allergies.   History: Past Medical History:  Diagnosis Date  . Allergy    hay fever  . Blood transfusion without reported diagnosis   . Depression   . Depression    Phreesia 11/07/2020  . Diabetes mellitus without complication ([place])   . GERD (gastroesophageal reflux disease)    past hx    Past Surgical History:  Procedure Laterality Date  . APPENDECTOMY    . BRAIN SURGERY N/A    Phreesia 05/31/2020  . CEREBRAL ANEURYSM REPAIR  1992  . HERNIA REPAIR N/A    Phreesia 05/31/2020  . INGUINAL HERNIA REPAIR     x2  . TONSILLECTOMY    . TUMOR EXCISION  2005   benign cervical   Family History  Problem Relation Age of Onset  . Breast cancer Mother   . Diabetes Brother   . Colon cancer Neg Hx   . Colon polyps Neg Hx   . Esophageal cancer Neg Hx   . Rectal cancer Neg Hx   . Stomach cancer Neg Hx    Social History   Socioeconomic History  . Marital status: Married    Spouse name: Not on file  . Number of children: Not on file  . Years of education: Not on file  . Highest education level: Not on file  Occupational History  . Not on file  Tobacco Use  . Smoking status: Former Smoker    Packs/day: 1.00    Years: 10.00    Pack  years: 10.00    Types: Cigarettes    Quit date: 2005    Years since quitting: 17.0  . Smokeless tobacco: Never Used  Vaping Use  . Vaping Use: Never used  Substance and Sexual Activity  . Alcohol use: Yes    Comment: occasionally   . Drug use: Never  . Sexual activity: Not Currently  Other Topics Concern  . Not on file  Social History Narrative  . Not on file   Social Determinants of Health   Financial Resource Strain: Not on file  Food Insecurity: Not on file  Transportation Needs: Not on file  Physical Activity: Not on file  Stress: Not on file  Social Connections: Not on file    Tobacco Counseling Counseling given: Not Answered    Diabetic? Yes    Activities of Daily Living In your present state of health, do you have any difficulty performing the following activities: 11/10/2020 06/03/2020  Hearing? N N  Vision? N N  Difficulty concentrating or making decisions? N N  Walking or climbing stairs? N N  Dressing or bathing? N N  Doing errands, shopping? N N  Some recent data might be hidden    Patient Care Team: Lorrene Reid, PA-C as PCP - General  Indicate any recent Medical Services you may have received from other than [facility] providers in the past year (date may be approximate).   Assessment:   This is a routine wellness examination for Patrick Buchanan.  Hearing/Vision screen No exam data present  Dietary issues and exercise activities discussed: -Recently restarted healthier eating habits. Continues to monitor carbohydrates and sweets. Goes for daily walks.   Goals   None    Depression Screen PHQ 2/9 Scores 11/10/2020 06/03/2020 02/04/2020 12/23/2019 11/18/2019 10/29/2019 08/11/2019  PHQ - 2 Score 0 0 0 0 0 1 0  PHQ- 9 Score 2 0 0 - 1 1 0    Fall Risk Fall Risk  11/10/2020 06/03/2020 12/23/2019 10/29/2019 10/28/2018  Falls in the past year? 0 1 0 1 0  Number falls in past yr: - 0 - 0 -  Injury with Fall? - 0 - 0 -  Follow up Falls evaluation completed Falls  evaluation completed - Falls evaluation completed Falls evaluation completed    [place]:  Any stairs in or around the home? Yes  If so, are there any without handrails? Yes  Home free of loose throw rugs in walkways, pet beds, electrical cords, etc? Yes  Adequate lighting in your home to reduce risk of falls? Yes   ASSISTIVE DEVICES UTILIZED TO PREVENT FALLS:  Life alert? No  Use of a cane, walker or w/c? No  Grab bars in the bathroom? No  Shower chair or bench in shower? No  Elevated toilet seat or a handicapped toilet? No   TIMED UP AND GO:  Was the test performed? No .  Length of time to ambulate 10 feet: 0 sec.   Telehealth   Cognitive Function: wnl   6CIT Screen 11/10/2020 10/29/2019  What Year? 0 points 0 points  What month? 0 points 0 points  What time? 0 points 0 points  Count back from 20 0 points 0 points  Months in reverse 0 points 0 points  Repeat phrase 0 points 0 points  Total Score 0 0    Immunizations Immunization History  Administered Date(s) Administered  . Influenza, High Dose Seasonal PF 08/07/2018  . Influenza-Unspecified 08/19/2019, 10/29/2019, 07/21/2020  . PFIZER(Purple Top)SARS-COV-2 Vaccination 11/11/2019  . Tdap 10/27/2015    TDAP status: Up to date  Flu Vaccine status: Up to date  Pneumococcal vaccine status: Due, Education has been provided regarding the importance of this vaccine. Advised may receive this vaccine at local pharmacy or Health Dept. Aware to provide a copy of the vaccination record if obtained from local pharmacy or Health Dept. Verbalized acceptance and understanding.  Covid-19 vaccine status: Completed vaccines  Qualifies for Shingles Vaccine? Yes   Zostavax completed No   Shingrix Completed?: No.    Education has been provided regarding the importance of this vaccine. Patient has been advised to call insurance company to determine out of pocket expense if they have not yet received  this vaccine. Advised may also receive vaccine at local pharmacy or Health Dept. Verbalized acceptance and understanding.  Screening Tests Health Maintenance  Topic Date Due  . Hepatitis C Screening  Never done  . FOOT EXAM  Never done  . OPHTHALMOLOGY EXAM  Never done  . COLONOSCOPY (Pts 45-24yrs Insurance coverage will need to be confirmed)  Never done  . PNA vac Low Risk Adult (1 of 2 - PCV13) Never done  . COVID-19 Vaccine (2 - Pfizer 3-dose series) 12/02/2019  . URINE MICROALBUMIN  01/29/2021  . HEMOGLOBIN A1C  05/03/2021  . TETANUS/TDAP  10/26/2025  . INFLUENZA VACCINE  Completed    Health Maintenance  Health Maintenance Due  Topic Date Due  . Hepatitis C Screening  Never done  . FOOT EXAM  Never done  . OPHTHALMOLOGY EXAM  Never done  . COLONOSCOPY (Pts 45-83yrs Insurance coverage will need to be confirmed)  Never done  . PNA vac Low Risk Adult (1 of 2 - PCV13) Never done  . COVID-19 Vaccine (2 - Pfizer 3-dose series) 12/02/2019    Colorectal cancer screening: Referral to GI placed today. Pt aware the office will call re: appt.  Lung Cancer Screening: (Low Dose CT Chest recommended if Age 26-80 years, 30 pack-year currently smoking OR have quit w/in 15years.) does not qualify.   Lung Cancer Screening Referral:   Additional Screening:  Hepatitis C Screening: does qualify; Completed Patient declined  Vision Screening: Recommended annual ophthalmology exams for early detection of glaucoma and other disorders of the eye. Is the patient up to date with their annual eye exam?  No  Who is the provider or what is the name of the office in which the patient attends annual eye exams? N/A If pt is not established with a provider, would they like to be referred to a provider to establish care? Yes .   Dental Screening: Recommended annual dental exams for proper oral hygiene  Community Resource Referral /  Chronic Care Management: CRR required this visit?  No   CCM required  this visit?  No      Plan:  -Discussed most recent labs which are essentially within normal limits or stable from prior with exception of mildly increased A1c and triglycerides. Patient was initially on Crestor 10 mg and would like to resume original dose so will send new rx. Advised to follow up in 6 weeks to repeat liver function and lipid panel. Recommend to continue monitoring/reducing carbohydrates and glucose.  -Continue Vitamin D3 5000 units and Lexapro.  -Placed referral to Ophthalmology (annual eye exam), vascular (variscosities) and gastroenterology (colonoscopy).  -Follow up in 4 months for DM, HLD, mood and FBW (a1c, cmp, lipid panel); schedule lab visit in 6 weeks for lipid panel and hepatic function (increased Crestor dose).  I have personally reviewed and noted the following in the patient's chart:   . Medical and social history . Use of alcohol, tobacco or illicit drugs  . Current medications and supplements . Functional ability and status . Nutritional status . Physical activity . Advanced directives . List of other physicians . Hospitalizations, surgeries, and ER visits in previous 12 months . Vitals . Screenings to include cognitive, depression, and falls . Referrals and appointments  In addition, I have reviewed and discussed with patient certain preventive protocols, quality metrics, and best practice recommendations. A written personalized care plan for preventive services as well as general preventive health recommendations were provided to patient.

## 2020-11-10 NOTE — Patient Instructions (Addendum)
High Triglycerides Eating Plan Triglycerides are a type of fat in the blood. High levels of triglycerides can increase your risk of heart disease and stroke. If your triglyceride levels are high, choosing the right foods can help lower your triglycerides and keep your heart healthy. Work with your health care provider or a diet and nutrition specialist (dietitian) to develop an eating plan that is right for you. What are tips for following this plan? General guidelines  Lose weight, if you are overweight. For most people, losing 5-10 lbs (2-5 kg) helps lower triglyceride levels. A weight-loss plan may include. ? 30 minutes of exercise at least 5 days a week. ? Reducing the amount of calories, sugar, and fat you eat.  Eat a wide variety of fresh fruits, vegetables, and whole grains. These foods are high in fiber.  Eat foods that contain healthy fats, such as fatty fish, nuts, seeds, and olive oil.  Avoid foods that are high in added sugar, added salt (sodium), saturated fat, and trans fat.  Avoid low-fiber, refined carbohydrates such as white bread, crackers, noodles, and white rice.  Avoid foods with partially hydrogenated oils (trans fats), such as fried foods or stick margarine.  Limit alcohol intake to no more than 1 drink a day for nonpregnant women and 2 drinks a day for men. One drink equals 12 oz of beer, 5 oz of wine, or 1 oz of hard liquor. Your health care provider may recommend that you drink less depending on your overall health.   Reading food labels  Check food labels for the amount of saturated fat. Choose foods with no or very little saturated fat.  Check food labels for the amount of trans fat. Choose foods with no trans fat.  Check food labels for the amount of cholesterol. Choose foods low in cholesterol. Ask your dietitian how much cholesterol you should have each day.  Check food labels for the amount of sodium. Choose foods with less than 140 milligrams (mg) per  serving. Shopping  Buy dairy products labeled as nonfat (skim) or low-fat (1%).  Avoid buying processed or prepackaged foods. These are often high in added sugar, sodium, and fat. Cooking  Choose healthy fats when cooking, such as olive oil or canola oil.  Cook foods using lower fat methods, such as baking, broiling, boiling, or grilling.  Make your own sauces, dressings, and marinades when possible, instead of buying them. Store-bought sauces, dressings, and marinades are often high in sodium and sugar. Meal planning  Eat more home-cooked food and less restaurant, buffet, and fast food.  Eat fatty fish at least 2 times each week. Examples of fatty fish include salmon, trout, mackerel, tuna, and herring.  If you eat whole eggs, do not eat more than 3 egg yolks per week. What foods are recommended? The items listed may not be a complete list. Talk with your dietitian about what dietary choices are best for you. Grains Whole wheat or whole grain breads, crackers, cereals, and pasta. Unsweetened oatmeal. Bulgur. Barley. Quinoa. Brown rice. Whole wheat flour tortillas. Vegetables Fresh or frozen vegetables. Low-sodium canned vegetables. Fruits All fresh, canned (in natural juice), or frozen fruits. Meats and other protein foods Skinless chicken or [place]. Ground chicken or [place]. Lean cuts of pork, trimmed of fat. Fish and seafood, especially salmon, trout, and herring. Egg whites. Dried beans, peas, or lentils. Unsalted nuts or seeds. Unsalted canned beans. Natural peanut or almond butter. Dairy Low-fat dairy products. Skim or low-fat (1%) milk. Reduced  fat (2%) and low-sodium cheese. Low-fat ricotta cheese. Low-fat cottage cheese. Plain, low-fat yogurt. Fats and oils Tub margarine without trans fats. Light or reduced-fat mayonnaise. Light or reduced-fat salad dressings. Avocado. Safflower, olive, sunflower, soybean, and canola oils. What foods are not recommended? The items listed  may not be a complete list. Talk with your dietitian about what dietary choices are best for you. Grains White bread. White (regular) pasta. White rice. Cornbread. Bagels. Pastries. Crackers that contain trans fat. Vegetables Creamed or fried vegetables. Vegetables in a cheese sauce. Fruits Sweetened dried fruit. Canned fruit in syrup. Fruit juice. Meats and other protein foods Fatty cuts of meat. Ribs. Chicken wings. Berniece Salines. Sausage. Bologna. Salami. Chitterlings. Fatback. Hot dogs. Bratwurst. Packaged lunch meats. Dairy Whole or reduced-fat (2%) milk. Half-and-half. Cream cheese. Full-fat or sweetened yogurt. Full-fat cheese. Nondairy creamers. Whipped toppings. Processed cheese or cheese spreads. Cheese curds. Beverages Alcohol. Sweetened drinks, such as soda, lemonade, fruit drinks, or punches. Fats and oils Butter. Stick margarine. Lard. Shortening. Ghee. Bacon fat. Tropical oils, such as coconut, palm kernel, or palm oils. Sweets and desserts Corn syrup. Sugars. Honey. Molasses. Candy. Jam and jelly. Syrup. Sweetened cereals. Cookies. Pies. Cakes. Donuts. Muffins. Ice cream. Condiments Store-bought sauces, dressings, and marinades that are high in sugar, such as ketchup and barbecue sauce. Summary  High levels of triglycerides can increase the risk of heart disease and stroke. Choosing the right foods can help lower your triglycerides.  Eat plenty of fresh fruits, vegetables, and whole grains. Choose low-fat dairy and lean meats. Eat fatty fish at least twice a week.  Avoid processed and prepackaged foods with added sugar, sodium, saturated fat, and trans fat.  If you need suggestions or have questions about what types of food are good for you, talk with your health care provider or a dietitian. This information is not intended to replace advice given to you by your health care provider. Make sure you discuss any questions you have with your health care provider. Document Revised:  02/11/2020 Document Reviewed: 02/11/2020 Elsevier Patient Education  [place] 65 Years and Older, Male Preventive care refers to lifestyle choices and visits with your health care provider that can promote health and wellness. This includes:  A yearly physical exam. This is also called an annual wellness visit.  Regular dental and eye exams.  Immunizations.  Screening for certain conditions.  Healthy lifestyle choices, such as: ? Eating a healthy diet. ? Getting regular exercise. ? Not using drugs or products that contain nicotine and tobacco. ? Limiting alcohol use. What can I expect for my preventive care visit? Physical exam Your health care provider will check your:  Height and weight. These may be used to calculate your BMI (body mass index). BMI is a measurement that tells if you are at a healthy weight.  Heart rate and blood pressure.  Body temperature.  Skin for abnormal spots. Counseling Your health care provider may ask you questions about your:  Past medical problems.  Family's medical history.  Alcohol, tobacco, and drug use.  Emotional well-being.  Home life and relationship well-being.  Sexual activity.  Diet, exercise, and sleep habits.  History of falls.  Memory and ability to understand (cognition).  Work and work Statistician.  Access to firearms. What immunizations do I need? Vaccines are usually given at various ages, according to a schedule. Your health care provider will recommend vaccines for you based on your age, medical history, and lifestyle or  other factors, such as travel or where you work.   What tests do I need? Blood tests  Lipid and cholesterol levels. These may be checked every 5 years, or more often depending on your overall health.  Hepatitis C test.  Hepatitis B test. Screening  Lung cancer screening. You may have this screening every year starting at age 95 if you have a 30-pack-year  history of smoking and currently smoke or have quit within the past 15 years.  Colorectal cancer screening. ? All adults should have this screening starting at age 7 and continuing until age 68. ? Your health care provider may recommend screening at age 48 if you are at increased risk. ? You will have tests every 1-10 years, depending on your results and the type of screening test.  Prostate cancer screening. Recommendations will vary depending on your family history and other risks.  Genital exam to check for testicular cancer or hernias.  Diabetes screening. ? This is done by checking your blood sugar (glucose) after you have not eaten for a while (fasting). ? You may have this done every 1-3 years.  Abdominal aortic aneurysm (AAA) screening. You may need this if you are a current or former smoker.  STD (sexually transmitted disease) testing, if you are at risk. Follow these instructions at home: Eating and drinking  Eat a diet that includes fresh fruits and vegetables, whole grains, lean protein, and low-fat dairy products. Limit your intake of foods with high amounts of sugar, saturated fats, and salt.  Take vitamin and mineral supplements as recommended by your health care provider.  Do not drink alcohol if your health care provider tells you not to drink.  If you drink alcohol: ? Limit how much you have to 0-2 drinks a day. ? Be aware of how much alcohol is in your drink. In the [place] one drink equals one 12 oz bottle of beer (355 mL), one 5 oz glass of wine (148 mL), or one 1 oz glass of hard liquor (44 mL).   Lifestyle  Take daily care of your teeth and gums. Brush your teeth every morning and night with fluoride toothpaste. Floss one time each day.  Stay active. Exercise for at least 30 minutes 5 or more days each week.  Do not use any products that contain nicotine or tobacco, such as cigarettes, e-cigarettes, and chewing tobacco. If you need help quitting, ask your  health care provider.  Do not use drugs.  If you are sexually active, practice safe sex. Use a condom or other form of protection to prevent STIs (sexually transmitted infections).  Talk with your health care provider about taking a low-dose aspirin or statin.  Find healthy ways to cope with stress, such as: ? Meditation, yoga, or listening to music. ? Journaling. ? Talking to a trusted person. ? Spending time with friends and family. Safety  Always wear your seat belt while driving or riding in a vehicle.  Do not drive: ? If you have been drinking alcohol. Do not ride with someone who has been drinking. ? When you are tired or distracted. ? While texting.  Wear a helmet and other protective equipment during sports activities.  If you have firearms in your house, make sure you follow all gun safety procedures. What's next?  Visit your health care provider once a year for an annual wellness visit.  Ask your health care provider how often you should have your eyes and teeth checked.  Stay up  to date on all vaccines. This information is not intended to replace advice given to you by your health care provider. Make sure you discuss any questions you have with your health care provider. Document Revised: 07/08/2019 Document Reviewed: 10/03/2018 Elsevier Patient Education  2021 [employer].

## 2020-11-10 NOTE — Telephone Encounter (Signed)
Patient needs a refill on Lexapro and uses [facility], thanks.

## 2020-11-10 NOTE — Addendum Note (Signed)
Addended by: Mickel Crow on: 11/10/2020 10:35 AM   Modules accepted: Orders

## 2020-12-07 ENCOUNTER — Other Ambulatory Visit: Payer: Self-pay

## 2020-12-07 DIAGNOSIS — I8390 Asymptomatic varicose veins of unspecified lower extremity: Secondary | ICD-10-CM

## 2020-12-17 ENCOUNTER — Other Ambulatory Visit: Payer: Self-pay | Admitting: Physician Assistant

## 2020-12-17 DIAGNOSIS — E1169 Type 2 diabetes mellitus with other specified complication: Secondary | ICD-10-CM

## 2020-12-22 ENCOUNTER — Other Ambulatory Visit: Payer: Medicare HMO

## 2020-12-22 ENCOUNTER — Other Ambulatory Visit: Payer: Self-pay

## 2020-12-22 DIAGNOSIS — E782 Mixed hyperlipidemia: Secondary | ICD-10-CM | POA: Diagnosis not present

## 2020-12-22 DIAGNOSIS — E1169 Type 2 diabetes mellitus with other specified complication: Secondary | ICD-10-CM

## 2020-12-23 LAB — LIPID PANEL
Chol/HDL Ratio: 3.5 ratio (ref 0.0–5.0)
Cholesterol, Total: 135 mg/dL (ref 100–199)
HDL: 39 mg/dL — ABNORMAL LOW (ref >39)
LDL Chol Calc (NIH): 67 mg/dL (ref 0–99)
Triglycerides: 173 mg/dL — ABNORMAL HIGH (ref 0–149)
VLDL Cholesterol Cal: 29 mg/dL (ref 5–40)

## 2020-12-23 LAB — HEPATIC FUNCTION PANEL
ALT: 27 IU/L (ref 0–44)
AST: 23 IU/L (ref 0–40)
Albumin: 4.5 g/dL (ref 3.8–4.8)
Alkaline Phosphatase: 96 IU/L (ref 44–121)
Bilirubin Total: 0.5 mg/dL (ref 0.0–1.2)
Bilirubin, Direct: 0.16 mg/dL (ref 0.00–0.40)
Total Protein: 7.1 g/dL (ref 6.0–8.5)

## 2020-12-24 ENCOUNTER — Ambulatory Visit (HOSPITAL_COMMUNITY)
Admission: RE | Admit: 2020-12-24 | Discharge: 2020-12-24 | Disposition: A | Discharge status: Home / Self Care | Payer: Medicare HMO | Source: Ambulatory Visit | Attending: Vascular Surgery | Admitting: Vascular Surgery

## 2020-12-24 ENCOUNTER — Other Ambulatory Visit: Payer: Self-pay

## 2020-12-24 ENCOUNTER — Ambulatory Visit: Payer: Medicare HMO | Admitting: Physician Assistant

## 2020-12-24 VITALS — BP 118/84 | HR 83 | Temp 97.8°F | Resp 20 | Ht 72.0 in | Wt 238.0 lb

## 2020-12-24 DIAGNOSIS — M79605 Pain in left leg: Secondary | ICD-10-CM

## 2020-12-24 DIAGNOSIS — I8392 Asymptomatic varicose veins of left lower extremity: Secondary | ICD-10-CM

## 2020-12-24 DIAGNOSIS — I8390 Asymptomatic varicose veins of unspecified lower extremity: Secondary | ICD-10-CM

## 2020-12-24 DIAGNOSIS — I872 Venous insufficiency (chronic) (peripheral): Secondary | ICD-10-CM

## 2020-12-24 NOTE — Progress Notes (Signed)
Requested by:  Lorrene Reid, PA-C [place],  [place] [address]  Reason for consultation: Lower extremity varicosities   History of Present Illness   Patrick Buchanan is a 70 y.o. (Oct 22, 1951) male who presents for evaluation of left lower extremity varicosities and left lower extremity pain.  Patient has had a longstanding history of varicose veins.  He has developed chronic, painful left medial lower extremity varicosity.  He has worn 20 to 30 mm thigh-high compression stockings for years.  Currently, these do not help with pain control.  No prior history of DVT or vein intervention.  He is unable to sit much more than 5 to 10 minutes without getting up and moving about.  He elevates his legs on his headboard for relief.  He denies claudication.  Venous symptoms include: positive if (X) [ x ] aching [  ] heavy [  ] tired  [ x ] throbbing [  ] burning  [  ] itching [  ]swelling [  ] bleeding [  ] ulcer Onset/duration: Worsening over the last year Occupation: Retired Aggravating factors: Sitting or standing  alleviating factors: elevation compression:  yes Helps:  no Pain medications:  ibuprofen Previous vein procedures:  no History of DVT:  no  Past Medical History:  Diagnosis Date  . Allergy    hay fever  . Blood transfusion without reported diagnosis   . Depression   . Depression    Phreesia 11/07/2020  . Diabetes mellitus without complication ([place])   . GERD (gastroesophageal reflux disease)    past hx     Past Surgical History:  Procedure Laterality Date  . APPENDECTOMY    . BRAIN SURGERY N/A    Phreesia 05/31/2020  . CEREBRAL ANEURYSM REPAIR  1992  . HERNIA REPAIR N/A    Phreesia 05/31/2020  . INGUINAL HERNIA REPAIR     x2  . TONSILLECTOMY    . TUMOR EXCISION  2005   benign cervical    Social History   Socioeconomic History  . Marital status: Married    Spouse name: Not on file  . Number of children: Not on file  .  Years of education: Not on file  . Highest education level: Not on file  Occupational History  . Not on file  Tobacco Use  . Smoking status: Former Smoker    Packs/day: 1.00    Years: 10.00    Pack years: 10.00    Types: Cigarettes    Quit date: 2005    Years since quitting: 17.1  . Smokeless tobacco: Never Used  Vaping Use  . Vaping Use: Never used  Substance and Sexual Activity  . Alcohol use: Yes    Comment: occasionally   . Drug use: Never  . Sexual activity: Not Currently  Other Topics Concern  . Not on file  Social History Narrative  . Not on file   Social Determinants of Health   Financial Resource Strain: Not on file  Food Insecurity: Not on file  Transportation Needs: Not on file  Physical Activity: Not on file  Stress: Not on file  Social Connections: Not on file  Intimate Partner Violence: Not on file    Family History  Problem Relation Age of Onset  . Breast cancer Mother   . Diabetes Brother   . Colon cancer Neg Hx   . Colon polyps Neg Hx   . Esophageal cancer Neg Hx   . Rectal cancer Neg  Hx   . Stomach cancer Neg Hx     Current Outpatient Medications  Medication Sig Dispense Refill  . chlorpheniramine (CHLOR-TRIMETON) 4 MG tablet Take 4 mg by mouth 2 (two) times daily as needed for allergies.    . Cholecalciferol 125 MCG (5000 UT) TABS Take 1 tablet by mouth daily.    . diphenhydrAMINE (BENADRYL) 25 mg capsule Take 25 mg by mouth every 6 (six) hours as needed.    Marland Kitchen escitalopram (LEXAPRO) 20 MG tablet Take 1 tablet (20 mg total) by mouth daily. 90 tablet 0  . ibuprofen (ADVIL) 200 MG tablet Take 200 mg by mouth every 6 (six) hours as needed.    . Multiple Vitamin (MULTIVITAMIN) tablet Take 1 tablet by mouth daily.    . naproxen sodium (ALEVE) 220 MG tablet Take 220 mg by mouth daily as needed.    . predniSONE (DELTASONE) 10 MG tablet     . rosuvastatin (CRESTOR) 10 MG tablet Take 1 tablet (10 mg total) by mouth daily. 90 tablet 3  . Vitamin D,  Ergocalciferol, (DRISDOL) 1.25 MG (50000 UNIT) CAPS capsule Take one tablet wkly 12 capsule 3   No current facility-administered medications for this visit.    No Known Allergies  REVIEW OF SYSTEMS (negative unless checked):   Cardiac:  []  Chest pain or chest pressure? []  Shortness of breath upon activity? []  Shortness of breath when lying flat? []  Irregular heart rhythm?  Vascular:  []  Pain in calf, thigh, or hip brought on by walking? []  Pain in feet at night that wakes you up from your sleep? []  Blood clot in your veins? []  Leg swelling?  Pulmonary:  []  Oxygen at home? []  Productive cough? []  Wheezing?  Neurologic:  []  Sudden weakness in arms or legs? []  Sudden numbness in arms or legs? []  Sudden onset of difficult speaking or slurred speech? []  Temporary loss of vision in one eye? []  Problems with dizziness?  Gastrointestinal:  []  Blood in stool? []  Vomited blood?  Genitourinary:  []  Burning when urinating? []  Blood in urine?  Psychiatric:  []  Major depression  Hematologic:  []  Bleeding problems? []  Problems with blood clotting?  Dermatologic:  []  Rashes or ulcers?  Constitutional:  []  Fever or chills?  Ear/Nose/Throat:  []  Change in hearing? []  Nose bleeds? []  Sore throat?  Musculoskeletal:  []  Back pain? []  Joint pain? []  Muscle pain?   Physical Examination       General:  WDWN in NAD; vital signs documented above Gait: Unaided, no ataxia HENT: WNL, normocephalic Pulmonary: normal non-labored breathing , without Rales, rhonchi,  wheezing Cardiac: regular HR, Skin: without rashes Extremities: with varicose veins, with reticular veins, without edema, without stasis pigmentation, without lipodermatosclerosis, without ulcers Musculoskeletal: no muscle wasting or atrophy  Neurologic: A&O X 3;  No focal weakness or paresthesias are detected Psychiatric:  The pt has Normal affect.    left    Non-invasive Vascular Imaging   LLE  Venous Insufficiency Duplex (12/24/2020):    LLE:  negative DVT and SVT,   positive GSV reflux from proximal to distal thigh. Present at [facility]   GSV diameter 0.64 to 1.3 cm  Negative SSV reflux ,  Positive deep venous reflux  Left:  - No evidence of deep vein thrombosis seen in the left lower extremity,  from the common femoral through the popliteal veins.  - No evidence of superficial venous reflux seen in the left short  saphenous vein.  - Venous reflux is noted in  the left common femoral vein.  - Venous reflux is noted in the left sapheno-femoral junction.  - Venous reflux is noted in the left greater saphenous vein in the thigh.  - Venous reflux is noted in the left popliteal vein.  Medical Decision Making   Patrick Buchanan is a 70 y.o. male who presents with: LLE chronic venous insufficiency, evidence of greater saphenous vein reflux on the left with dilated vein and left varicose veins with complications including pain.  There is no imaging evidence of DVT or SVT.  Lower extremities are well perfused.   Based on the patient's history and examination, I recommend: Compression, elevation, weight control and daily exercise. Written patient information provided.  The patient will follow up in 3 months with Dr. Oneida Alar or Scot Dock.  Thank you for allowing [place] to participate in this patient's care.   Barbie Banner, PA-C [facility] Office: [phone]  12/24/2020, 1:55 PM  Clinic MD: Dr. Stanford Breed on-call

## 2020-12-27 ENCOUNTER — Encounter: Payer: Self-pay | Admitting: Vascular Surgery

## 2020-12-27 DIAGNOSIS — H43813 Vitreous degeneration, bilateral: Secondary | ICD-10-CM | POA: Diagnosis not present

## 2020-12-27 DIAGNOSIS — H2513 Age-related nuclear cataract, bilateral: Secondary | ICD-10-CM | POA: Diagnosis not present

## 2020-12-27 DIAGNOSIS — H18592 Other hereditary corneal dystrophies, left eye: Secondary | ICD-10-CM | POA: Diagnosis not present

## 2020-12-27 DIAGNOSIS — H5319 Other subjective visual disturbances: Secondary | ICD-10-CM | POA: Diagnosis not present

## 2020-12-27 DIAGNOSIS — E119 Type 2 diabetes mellitus without complications: Secondary | ICD-10-CM | POA: Diagnosis not present

## 2020-12-27 DIAGNOSIS — H3581 Retinal edema: Secondary | ICD-10-CM | POA: Diagnosis not present

## 2020-12-29 LAB — HM DIABETES EYE EXAM

## 2021-01-05 DIAGNOSIS — M25561 Pain in right knee: Secondary | ICD-10-CM | POA: Diagnosis not present

## 2021-02-08 ENCOUNTER — Other Ambulatory Visit: Payer: Self-pay | Admitting: Physician Assistant

## 2021-02-08 DIAGNOSIS — F341 Dysthymic disorder: Secondary | ICD-10-CM

## 2021-02-17 ENCOUNTER — Telehealth: Payer: Self-pay | Admitting: Physician Assistant

## 2021-02-17 DIAGNOSIS — Z1211 Encounter for screening for malignant neoplasm of colon: Secondary | ICD-10-CM

## 2021-02-17 NOTE — Telephone Encounter (Signed)
Referral originally placed on 11/10/20 for gastro- colonoscopy.  That referral was canceled?  I have placed another referral as patient is requesting colonoscopy and our records show he needs it done.   Please insure this referral is processed and let me know if there is something else I need to do.   Thank you!

## 2021-02-17 NOTE — Telephone Encounter (Signed)
Referral placed per protocol. AS, CMA 

## 2021-03-07 ENCOUNTER — Other Ambulatory Visit: Payer: Self-pay | Admitting: Physician Assistant

## 2021-03-07 ENCOUNTER — Other Ambulatory Visit: Payer: Medicare HMO

## 2021-03-07 ENCOUNTER — Other Ambulatory Visit: Payer: Self-pay

## 2021-03-07 DIAGNOSIS — E1169 Type 2 diabetes mellitus with other specified complication: Secondary | ICD-10-CM

## 2021-03-07 DIAGNOSIS — Z0000 Encounter for general adult medical examination without abnormal findings: Secondary | ICD-10-CM | POA: Diagnosis not present

## 2021-03-07 DIAGNOSIS — E782 Mixed hyperlipidemia: Secondary | ICD-10-CM

## 2021-03-08 LAB — COMPREHENSIVE METABOLIC PANEL
ALT: 44 IU/L (ref 0–44)
AST: 24 IU/L (ref 0–40)
Albumin/Globulin Ratio: 1.9 (ref 1.2–2.2)
Albumin: 4.5 g/dL (ref 3.8–4.8)
Alkaline Phosphatase: 101 IU/L (ref 44–121)
BUN/Creatinine Ratio: 16 (ref 10–24)
BUN: 17 mg/dL (ref 8–27)
Bilirubin Total: 0.6 mg/dL (ref 0.0–1.2)
CO2: 27 mmol/L (ref 20–29)
Calcium: 9.6 mg/dL (ref 8.6–10.2)
Chloride: 101 mmol/L (ref 96–106)
Creatinine, Ser: 1.05 mg/dL (ref 0.76–1.27)
Globulin, Total: 2.4 g/dL (ref 1.5–4.5)
Glucose: 100 mg/dL — ABNORMAL HIGH (ref 65–99)
Potassium: 4.7 mmol/L (ref 3.5–5.2)
Sodium: 142 mmol/L (ref 134–144)
Total Protein: 6.9 g/dL (ref 6.0–8.5)
eGFR: 78 mL/min/{1.73_m2} (ref >59)

## 2021-03-08 LAB — LIPID PANEL
Chol/HDL Ratio: 3.1 ratio (ref 0.0–5.0)
Cholesterol, Total: 151 mg/dL (ref 100–199)
HDL: 48 mg/dL (ref >39)
LDL Chol Calc (NIH): 83 mg/dL (ref 0–99)
Triglycerides: 109 mg/dL (ref 0–149)
VLDL Cholesterol Cal: 20 mg/dL (ref 5–40)

## 2021-03-08 LAB — HEMOGLOBIN A1C
Est. average glucose Bld gHb Est-mCnc: 123 mg/dL
Hgb A1c MFr Bld: 5.9 % — ABNORMAL HIGH (ref 4.8–5.6)

## 2021-03-10 ENCOUNTER — Ambulatory Visit (INDEPENDENT_AMBULATORY_CARE_PROVIDER_SITE_OTHER): Payer: Medicare HMO | Admitting: Physician Assistant

## 2021-03-10 ENCOUNTER — Encounter: Payer: Self-pay | Admitting: Physician Assistant

## 2021-03-10 VITALS — BP 122/76 | Ht 72.0 in | Wt 208.0 lb

## 2021-03-10 DIAGNOSIS — E1169 Type 2 diabetes mellitus with other specified complication: Secondary | ICD-10-CM

## 2021-03-10 DIAGNOSIS — E781 Pure hyperglyceridemia: Secondary | ICD-10-CM

## 2021-03-10 DIAGNOSIS — E782 Mixed hyperlipidemia: Secondary | ICD-10-CM | POA: Diagnosis not present

## 2021-03-10 DIAGNOSIS — F341 Dysthymic disorder: Secondary | ICD-10-CM | POA: Diagnosis not present

## 2021-03-10 MED ORDER — ESCITALOPRAM OXALATE 20 MG PO TABS: 1.0000 | ORAL_TABLET | Freq: Every day | ORAL | 1 refills | Status: DC

## 2021-03-10 MED ORDER — ROSUVASTATIN CALCIUM 20 MG PO TABS: 20.0000 mg | ORAL_TABLET | Freq: Every day | ORAL | 3 refills | Status: DC

## 2021-03-10 NOTE — Progress Notes (Signed)
Telehealth office visit note for Patrick Reid, PA-C- at [facility] Care at [facility]   I connected with current patient today by telephone and verified that I am speaking with the correct person   . Location of the patient: Home . Location of the provider: Office - This visit type was conducted due to national recommendations for restrictions regarding the COVID-19 Pandemic (e.g. social distancing) in an effort to limit this patient's exposure and mitigate transmission in our community.    - No physical exam could be performed with this format, beyond that communicated to [place] by the patient/ family members as noted.   - Additionally my office staff/ schedulers were to discuss with the patient that there may be a monetary charge related to this service, depending on their medical insurance.  My understanding is that patient understood and consented to proceed.     _________________________________________________________________________________   History of Present Illness: Patient calls in to follow up on diabetes mellitus, hyperlipidemia and mood management.  Diabetes: Pt denies increased urination or thirst. Pt managing with diet. Has not felt like glucose has been low. Does not check glucose at home. Does not have glucometer.   HLD: Pt taking medication as directed without issues. Denies side effects including myalgias and RUQ pain. States has been staying more active with house work.   Mood: Reports medication compliance. Denies mood changes or SI/HI.      No flowsheet data found.  Depression screen [facility] 2/9 03/10/2021 11/10/2020 06/03/2020 02/04/2020 12/23/2019  Decreased Interest 0 0 0 0 0  Down, Depressed, Hopeless 0 0 0 0 0  PHQ - 2 Score 0 0 0 0 0  Altered sleeping 0 1 0 0 -  Tired, decreased energy 0 0 0 0 -  Change in appetite 0 1 0 0 -  Feeling bad or failure about yourself  0 0 0 0 -  Trouble concentrating 0 0 0 0 -  Moving slowly or fidgety/restless 0 0 0 0 -   Suicidal thoughts 0 0 0 0 -  PHQ-9 Score 0 2 0 0 -  Difficult doing work/chores - Not difficult at all - - -      Impression and Recommendations:     1. Type 2 diabetes mellitus with other specified complication, without long-term current use of insulin ([place])   2. Mixed diabetic hyperlipidemia associated with type 2 diabetes mellitus ([place])   3. Dysthymia   4. Hypertriglyceridemia     Type 2 diabetes mellitus with other specified complication, without long-term current use of insulin: -Recent A1c 5.9, improved from 6.6 and at goal. -Continue to monitor carbohydrate and glucose intake. -Continue to stay as active as possible. -Will continue to monitor.  Mixed diabetic hyperlipidemia associated with type 2 diabetes mellitus, hypertriglyceridemia: -Recent lipid panel: total cholesterol 151, triglycerides 109, HDL 48, LDL 83 (goal <70). -Discussed with patient increasing Crestor 20 mg. Patient is agreeable. Recent hepatic function normal. -Follow a heart healthy and low carbohydrate diet. Continue to stay as active as possible. -Will continue to monitor.  Dysthymia: -PHQ-9 score of 0, controlled. -Continue current medication regimen. -Will continue to monitor.  - As part of my medical decision making, I reviewed the following data within the [place] History obtained from pt /family, CMA notes reviewed and incorporated if applicable, Labs reviewed, Radiograph/ tests reviewed if applicable and OV notes from prior OV's with me, as well as any other specialists she/he has seen since  seeing me last, were all reviewed and used in my medical decision making process today.    - Additionally, when appropriate, discussion had with patient regarding our treatment plan, and their biases/concerns about that plan were used in my medical decision making today.    - The patient agreed with the plan and demonstrated an understanding of the instructions.   No barriers to  understanding were identified.     - The patient was advised to call back or seek an in-person evaluation if the symptoms worsen or if the condition fails to improve as anticipated.   Return for MD, HLD, Mood and FBW (lipid panel, cmp, cbc, a1c) in 4-5 months.    No orders of the defined types were placed in this encounter.   Meds ordered this encounter  Medications  . rosuvastatin (CRESTOR) 20 MG tablet    Sig: Take 1 tablet (20 mg total) by mouth daily.    Dispense:  90 tablet    Refill:  3    Order Specific Question:   Supervising Provider    Answer:   Beatrice Lecher D [[id number]]  . escitalopram (LEXAPRO) 20 MG tablet    Sig: Take 1 tablet (20 mg total) by mouth daily.    Dispense:  90 tablet    Refill:  1    Order Specific Question:   Supervising Provider    Answer:   Beatrice Lecher D [[id number]]    Medications Discontinued During This Encounter  Medication Reason  . rosuvastatin (CRESTOR) 10 MG tablet Dose change  . escitalopram (LEXAPRO) 20 MG tablet Reorder       Time spent on telephone encounter was 8 minutes.   Note:  This note was prepared with assistance of Dragon voice recognition software. Occasional wrong-word or sound-a-like substitutions may have occurred due to the inherent limitations of voice recognition software.    The [place] was signed into law in 2016 which includes the topic of electronic health records.  This provides immediate access to information in MyChart.  This includes consultation notes, operative notes, office notes, lab results and pathology reports.  If you have any questions about what you read please let us know at your next visit or call us at the office.  We are right here with you.   __________________________________________________________________________________     Patient Care Team    Relationship Specialty Notifications Start End  Patrick Buchanan, [place] PCP - General   02/22/20      -Vitals obtained;  medications/ allergies reconciled;  personal medical, social, Sx etc.histories were updated by CMA, reviewed by me and are reflected in chart   Patient Active Problem List   Diagnosis Date Noted  . Mixed diabetic hyperlipidemia associated with type 2 diabetes mellitus ([place]) 11/18/2019  . Diabetes mellitus ([place]) 11/18/2019  . Counseling on health promotion and disease prevention 11/18/2019  . History of smoking for 6-10 years 10/29/2019  . Pain in right knee 08/25/2019  . Vitamin D insufficiency 08/11/2019  . Hypertriglyceridemia 08/11/2019  . Prediabetes 08/11/2019  . Serum sodium elevated 08/11/2019  . dysthymia 10/28/2018  . (BMI 30.0-34.9) 10/28/2018  . Elevated blood pressure reading- no dx HTN 10/28/2018  . Family history of diabetes mellitus (DM) 10/28/2018     Current Meds  Medication Sig  . chlorpheniramine (CHLOR-TRIMETON) 4 MG tablet Take 4 mg by mouth 2 (two) times daily as needed for allergies.  . Cholecalciferol 125 MCG (5000 UT) TABS Take 1 tablet by mouth daily.  Marland Kitchen  ibuprofen (ADVIL) 200 MG tablet Take 200 mg by mouth every 6 (six) hours as needed.  . Multiple Vitamin (MULTIVITAMIN) tablet Take 1 tablet by mouth daily.  . naproxen sodium (ALEVE) 220 MG tablet Take 220 mg by mouth daily as needed.  . predniSONE (DELTASONE) 10 MG tablet   . rosuvastatin (CRESTOR) 20 MG tablet Take 1 tablet (20 mg total) by mouth daily.  . Vitamin D, Ergocalciferol, (DRISDOL) 1.25 MG (50000 UNIT) CAPS capsule Take one tablet wkly  . [DISCONTINUED] escitalopram (LEXAPRO) 20 MG tablet TAKE 1 TABLET BY MOUTH DAILY.  . [DISCONTINUED] rosuvastatin (CRESTOR) 10 MG tablet Take 1 tablet (10 mg total) by mouth daily.     Allergies:  No Known Allergies   ROS:  See above HPI for pertinent positives and negatives   Objective:   Blood pressure 122/76, height 6' (1.829 m), weight 208 lb (94.3 kg).  (if some vitals are omitted, this means that patient was UNABLE to obtain them. ) General: A  & O * 3; sounds in no acute distress Respiratory: speaking in full sentences, no conversational dyspnea Psych: insight appears good, mood- appears full

## 2021-03-29 ENCOUNTER — Encounter: Payer: Self-pay | Admitting: Physician Assistant

## 2021-03-31 ENCOUNTER — Encounter: Payer: Self-pay | Admitting: Vascular Surgery

## 2021-03-31 ENCOUNTER — Ambulatory Visit: Payer: Medicare HMO | Admitting: Vascular Surgery

## 2021-03-31 ENCOUNTER — Other Ambulatory Visit: Payer: Self-pay

## 2021-03-31 VITALS — BP 125/76 | HR 73 | Temp 98.5°F | Resp 18 | Ht 72.0 in | Wt 226.0 lb

## 2021-03-31 DIAGNOSIS — I83813 Varicose veins of bilateral lower extremities with pain: Secondary | ICD-10-CM

## 2021-03-31 NOTE — Progress Notes (Signed)
REASON FOR VISIT:   Follow-up of painful varicose veins of the left lower extremity  MEDICAL ISSUES:   PAINFUL VARICOSE VEINS: This patient has painful varicose veins of the left lower extremity and has failed conservative treatment.  He has CEAP C2 venous disease.  I think he would be a good candidate for laser ablation of the left great saphenous vein down to the distal thigh where the vein then exits the fascia and feeds this large cluster of varicosities in his left popliteal space and medial left leg.  These veins are under significant pressure.  He would require greater than 20 stab phlebectomies.  I have discussed the indications for endovenous laser ablation of the left GSV, that is to lower the pressure in the veins and potentially help relieve the symptoms from venous hypertension. I have also discussed alternative options including conservative treatment with leg elevation, compression therapy, exercise, avoiding prolonged sitting and standing, and weight management. I have discussed the potential complications of the procedure, including, but not limited to: bleeding, bruising, leg swelling, significant pain from phlebitis, deep venous thrombosis, or failure of the vein to close.  I have also explained that venous insufficiency is a chronic disease, and that the patient is at risk for recurrent varicose veins in the future.  All of the patient's questions were encouraged and answered. They are agreeable to proceed.   I have discussed with the patient the indications for stab phlebectomy.  I have explained to the patient that that will have small scars from the stab incisions.  I explained that the other risks include leg swelling, bruising, bleeding, and phlebitis.  All the patient's questions were encouraged and answered and they are agreeable to proceed.   HPI:   Patrick Buchanan is a pleasant 70 y.o. male who was seen in consultation by Marlinda Mike, PA on 12/24/2020 with  painful varicose veins of the left lower extremity.  The patient was having significant symptoms associated with his varicosities and had significant reflux noted on his duplex scan.  He was encouraged to elevate his legs, exercise, and wear thigh-high compression stockings with a gradient of 20 to 30 mmHg.  He was set up for a 65-month follow-up visit.  Past Medical History:  Diagnosis Date   Allergy    hay fever   Blood transfusion without reported diagnosis    Depression    Depression    Phreesia 11/07/2020   Diabetes mellitus without complication ([facility])    GERD (gastroesophageal reflux disease)    past hx     Family History  Problem Relation Age of Onset   Breast cancer Mother    Diabetes Brother    Colon cancer Neg Hx    Colon polyps Neg Hx    Esophageal cancer Neg Hx    Rectal cancer Neg Hx    Stomach cancer Neg Hx     SOCIAL HISTORY: Social History   Tobacco Use   Smoking status: Former    Packs/day: 1.00    Years: 10.00    Pack years: 10.00    Types: Cigarettes    Quit date: 2005    Years since quitting: 17.4   Smokeless tobacco: Never  Substance Use Topics   Alcohol use: Yes    Comment: occasionally     No Known Allergies  Current Outpatient Medications  Medication Sig Dispense Refill   chlorpheniramine (CHLOR-TRIMETON) 4 MG tablet Take 4 mg by mouth 2 (two) times daily as needed for  allergies.     Cholecalciferol 125 MCG (5000 UT) TABS Take 1 tablet by mouth daily.     escitalopram (LEXAPRO) 20 MG tablet Take 1 tablet (20 mg total) by mouth daily. 90 tablet 1   ibuprofen (ADVIL) 200 MG tablet Take 200 mg by mouth every 6 (six) hours as needed.     Multiple Vitamin (MULTIVITAMIN) tablet Take 1 tablet by mouth daily.     naproxen sodium (ALEVE) 220 MG tablet Take 220 mg by mouth daily as needed.     predniSONE (DELTASONE) 10 MG tablet      rosuvastatin (CRESTOR) 20 MG tablet Take 1 tablet (20 mg total) by mouth daily. 90 tablet 3   Vitamin D,  Ergocalciferol, (DRISDOL) 1.25 MG (50000 UNIT) CAPS capsule Take one tablet wkly 12 capsule 3   diphenhydrAMINE (BENADRYL) 25 mg capsule Take 25 mg by mouth every 6 (six) hours as needed. (Patient not taking: No sig reported)     No current facility-administered medications for this visit.    REVIEW OF SYSTEMS:  [X]  denotes positive finding, [ ]  denotes negative finding Cardiac  Comments:  Chest pain or chest pressure:    Shortness of breath upon exertion:    Short of breath when lying flat:    Irregular heart rhythm:        Vascular    Pain in calf, thigh, or hip brought on by ambulation:    Pain in feet at night that wakes you up from your sleep:     Blood clot in your veins:    Leg swelling:         Pulmonary    Oxygen at home:    Productive cough:     Wheezing:         Neurologic    Sudden weakness in arms or legs:     Sudden numbness in arms or legs:     Sudden onset of difficulty speaking or slurred speech:    Temporary loss of vision in one eye:     Problems with dizziness:         Gastrointestinal    Blood in stool:     Vomited blood:         Genitourinary    Burning when urinating:     Blood in urine:        Psychiatric    Major depression:         Hematologic    Bleeding problems:    Problems with blood clotting too easily:        Skin    Rashes or ulcers:        Constitutional    Fever or chills:     PHYSICAL EXAM:   Vitals:   03/31/21 1435  BP: 125/76  Pulse: 73  Resp: 18  Temp: 98.5 F (36.9 C)  TempSrc: Temporal  SpO2: 98%  Weight: 226 lb (102.5 kg)  Height: 6' (1.829 m)    GENERAL: The patient is a well-nourished male, in no acute distress. The vital signs are documented above. CARDIAC: There is a regular rate and rhythm.  VASCULAR: I do not detect carotid bruits. He has a palpable left posterior tibial pulse. He has a monophasic dorsalis pedis signal bilaterally and a biphasic posterior tibial signal bilaterally. He has a  markedly dilated great saphenous vein down the distal thigh where the vein then exits the fascia.  I think we could cannulate the vein in the distal thigh.  PULMONARY: There is good air exchange bilaterally without wheezing or rales. ABDOMEN: Soft and non-tender with normal pitched bowel sounds.  MUSCULOSKELETAL: There are no major deformities or cyanosis. NEUROLOGIC: No focal weakness or paresthesias are detected. SKIN: There are no ulcers or rashes noted. PSYCHIATRIC: The patient has a normal affect.  DATA:    VENOUS DUPLEX:   I have reviewed the venous duplex scan that was done on 12/24/2020.  This was of the left lower extremity only.  There is no evidence of deep venous thrombosis.  There was deep venous reflux involving the common femoral vein and popliteal vein.  There was superficial venous reflux in the left great saphenous vein from the saphenofemoral junction to the distal thigh.  At that point the vein exited the fascia and fed a large cluster of varicose veins.  The vein was not visualized below that.  Diameters of the vein ranged from 6.4-8 mm.  There was a measurement of 1.3 on the study but I think this refers to a branch off of the vein as I did not visualize any area of narrowing myself by duplex.   Deitra Mayo [facility] [phone]

## 2021-04-13 ENCOUNTER — Telehealth: Payer: Self-pay | Admitting: Physician Assistant

## 2021-04-13 NOTE — Telephone Encounter (Signed)
Patient states he has saponis vein in his left leg. The surgeon said that is not is what is causing a strange feeling in his leg. It is not like numbness. He describes it as decreased sensation. Please advise, thanks.

## 2021-04-13 NOTE — Telephone Encounter (Signed)
Is this an immediate concern or can he be scheduled with [name] soonest available?

## 2021-04-13 NOTE — Telephone Encounter (Signed)
If he is having numbness and strange sensation, it is likely more to do with a nerve than a vein. He should be seen at next available with [name] to discuss. Thanks.

## 2021-04-14 NOTE — Telephone Encounter (Signed)
Please schedule patient for the soonest available appointment with [facility]

## 2021-05-07 ENCOUNTER — Other Ambulatory Visit: Payer: Self-pay | Admitting: Physician Assistant

## 2021-05-07 DIAGNOSIS — E1169 Type 2 diabetes mellitus with other specified complication: Secondary | ICD-10-CM

## 2021-06-30 ENCOUNTER — Encounter: Payer: Self-pay | Admitting: Physician Assistant

## 2021-06-30 ENCOUNTER — Ambulatory Visit (INDEPENDENT_AMBULATORY_CARE_PROVIDER_SITE_OTHER): Payer: Medicare HMO | Admitting: Physician Assistant

## 2021-06-30 ENCOUNTER — Other Ambulatory Visit: Payer: Self-pay

## 2021-06-30 VITALS — BP 147/89 | HR 84 | Temp 98.6°F | Ht 72.0 in | Wt 242.4 lb

## 2021-06-30 DIAGNOSIS — R202 Paresthesia of skin: Secondary | ICD-10-CM

## 2021-06-30 MED ORDER — GABAPENTIN 100 MG PO CAPS: 100.0000 mg | ORAL_CAPSULE | Freq: Two times a day (BID) | ORAL | 0 refills | Status: DC

## 2021-06-30 NOTE — Patient Instructions (Signed)
Peripheral Neuropathy ?Peripheral neuropathy is a type of nerve damage. It affects nerves that carry signals between the spinal cord and the arms, legs, and the rest of the body (peripheral nerves). It does not affect nerves in the spinal cord or brain. In peripheral neuropathy, one nerve or a group of nerves may be damaged. Peripheral neuropathy is a broad category that includes many specific nerve disorders, like diabetic neuropathy, hereditary neuropathy, and carpal tunnel syndrome. ?What are the causes? ?This condition may be caused by: ?Diabetes. This is the most common cause of peripheral neuropathy. ?Nerve injury. ?Pressure or stress on a nerve that lasts a long time. ?Lack (deficiency) of B vitamins. This can result from alcoholism, poor diet, or a restricted diet. ?Infections. ?Autoimmune diseases, such as rheumatoid arthritis and systemic lupus erythematosus. ?Nerve diseases that are passed from parent to child (inherited). ?Some medicines, such as cancer medicines (chemotherapy). ?Poisonous (toxic) substances, such as lead and mercury. ?Too little blood flowing to the legs. ?Kidney disease. ?Thyroid disease. ?In some cases, the cause of this condition is not known. ?What are the signs or symptoms? ?Symptoms of this condition depend on which of your nerves is damaged. Common symptoms include: ?Loss of feeling (numbness) in the feet, hands, or both. ?Tingling in the feet, hands, or both. ?Burning pain. ?Very sensitive skin. ?Weakness. ?Not being able to move a part of the body (paralysis). ?Muscle twitching. ?Clumsiness or poor coordination. ?Loss of balance. ?Not being able to control your bladder. ?Feeling dizzy. ?Sexual problems. ?How is this diagnosed? ?Diagnosing and finding the cause of peripheral neuropathy can be difficult. Your health care provider will take your medical history and do a physical exam. A neurological exam will also be done. This involves checking things that are affected by your  brain, spinal cord, and nerves (nervous system). For example, your health care provider will check your reflexes, how you move, and what you can feel. ?You may have other tests, such as: ?Blood tests. ?Electromyogram (EMG) and nerve conduction tests. These tests check nerve function and how well the nerves are controlling the muscles. ?Imaging tests, such as CT scans or MRI to rule out other causes of your symptoms. ?Removing a small piece of nerve to be examined in a lab (nerve biopsy). ?Removing and examining a small amount of the fluid that surrounds the brain and spinal cord (lumbar puncture). ?How is this treated? ?Treatment for this condition may involve: ?Treating the underlying cause of the neuropathy, such as diabetes, kidney disease, or vitamin deficiencies. ?Stopping medicines that can cause neuropathy, such as chemotherapy. ?Medicine to help relieve pain. Medicines may include: ?Prescription or over-the-counter pain medicine. ?Antiseizure medicine. ?Antidepressants. ?Pain-relieving patches that are applied to painful areas of skin. ?Surgery to relieve pressure on a nerve or to destroy a nerve that is causing pain. ?Physical therapy to help improve movement and balance. ?Devices to help you move around (assistive devices). ?Follow these instructions at home: ?Medicines ?Take over-the-counter and prescription medicines only as told by your health care provider. Do not take any other medicines without first asking your health care provider. ?Do not drive or use heavy machinery while taking prescription pain medicine. ?Lifestyle ? ?Do not use any products that contain nicotine or tobacco, such as cigarettes and e-cigarettes. Smoking keeps blood from reaching damaged nerves. If you need help quitting, ask your health care provider. ?Avoid or limit alcohol. Too much alcohol can cause a vitamin B deficiency, and vitamin B is needed for healthy nerves. ?Eat a   healthy diet. This includes: ?Eating foods that are  high in fiber, such as fresh fruits and vegetables, whole grains, and beans. ?Limiting foods that are high in fat and processed sugars, such as fried or sweet foods. ?General instructions ? ?If you have diabetes, work closely with your health care provider to keep your blood sugar under control. ?If you have numbness in your feet: ?Check every day for signs of injury or infection. Watch for redness, warmth, and swelling. ?Wear padded socks and comfortable shoes. These help protect your feet. ?Develop a good support system. Living with peripheral neuropathy can be stressful. Consider talking with a mental health specialist or joining a support group. ?Use assistive devices and attend physical therapy as told by your health care provider. This may include using a walker or a cane. ?Keep all follow-up visits as told by your health care provider. This is important. ?Contact a health care provider if: ?You have new signs or symptoms of peripheral neuropathy. ?You are struggling emotionally from dealing with peripheral neuropathy. ?Your pain is not well-controlled. ?Get help right away if: ?You have an injury or infection that is not healing normally. ?You develop new weakness in an arm or leg. ?You have fallen or do so frequently. ?Summary ?Peripheral neuropathy is when the nerves in the arms, or legs are damaged, resulting in numbness, weakness, or pain. ?There are many causes of peripheral neuropathy, including diabetes, pinched nerves, vitamin deficiencies, autoimmune disease, and hereditary conditions. ?Diagnosing and finding the cause of peripheral neuropathy can be difficult. Your health care provider will take your medical history, do a physical exam, and do tests, including blood tests and nerve function tests. ?Treatment involves treating the underlying cause of the neuropathy and taking medicines to help control pain. Physical therapy and assistive devices may also help. ?This information is not intended to  replace advice given to you by your health care provider. Make sure you discuss any questions you have with your health care provider. ?Document Revised: 07/20/2020 Document Reviewed: 07/20/2020 ?Elsevier Patient Education ? 2022 [employer]vier Inc[employer] ? ?

## 2021-06-30 NOTE — Progress Notes (Signed)
Established Patient Office Visit  Subjective:  Patient ID: Patrick Buchanan, male    DOB: 1951-07-01  Age: 70 y.o. MRN: [id number]  CC:  Chief Complaint  Patient presents with   Acute Visit    HPI Patrick Buchanan presents for weird sensation on outer side of left lower extremity. States also feels like his left foot is unsteady  which he notices more when walking on a textured surface such as carpet. Symptoms have been ongoing x a few months. Does not feel like he can go downstairs without assistance such as using handrails. Reports one fall related to stumbling with something on the floor. When sitting on a hard chair leaning on his left side feels a pulse sensation on his lower bottom. Denies back pain, unintentional weight loss, night sweats, rash or fever. Does report some tingling sensation in left lower extremity.  Past Medical History:  Diagnosis Date   Allergy    hay fever   Blood transfusion without reported diagnosis    Depression    Depression    [facility] 11/07/2020   Diabetes mellitus without complication (HCC)    GERD (gastroesophageal reflux disease)    past hx     Past Surgical History:  Procedure Laterality Date   APPENDECTOMY     BRAIN SURGERY N/A    [facility] 05/31/2020   CEREBRAL ANEURYSM REPAIR  1992   HERNIA REPAIR N/A    [facility] 05/31/2020   INGUINAL HERNIA REPAIR     x2   TONSILLECTOMY     TUMOR EXCISION  2005   benign cervical    Family History  Problem Relation Age of Onset   Breast cancer Mother    Diabetes Brother    Colon cancer Neg Hx    Colon polyps Neg Hx    Esophageal cancer Neg Hx    Rectal cancer Neg Hx    Stomach cancer Neg Hx     Social History   Socioeconomic History   Marital status: Married    Spouse name: Not on file   Number of children: 4   Years of education: Not on file   Highest education level: Not on file  Occupational History   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  Tobacco Use   Smoking status: Former     Packs/day: 1.00    Years: 10.00    Pack years: 10.00    Types: Cigarettes    Quit date: 2005    Years since quitting: 17.6   Smokeless tobacco: Never  Vaping Use   Vaping Use: Never used  Substance and Sexual Activity   Alcohol use: Yes    Comment: occasionally    Drug use: Never   Sexual activity: Not Currently  Other Topics Concern   Not on file  Social History Narrative   Not on file   Social Determinants of Health   Financial Resource Strain: Not on file  Food Insecurity: Not on file  Transportation Needs: Not on file  Physical Activity: Not on file  Stress: Not on file  Social Connections: Not on file  Intimate Partner Violence: Not on file    Outpatient Medications Prior to Visit  Medication Sig Dispense Refill   chlorpheniramine (CHLOR-TRIMETON) 4 MG tablet Take 4 mg by mouth 2 (two) times daily as needed for allergies.     Cholecalciferol 125 MCG (5000 UT) TABS Take 1 tablet by mouth daily.     escitalopram (LEXAPRO) 20 MG tablet Take 1 tablet (20 mg total)  by mouth daily. 90 tablet 1   ibuprofen (ADVIL) 200 MG tablet Take 200 mg by mouth every 6 (six) hours as needed.     Multiple Vitamin (MULTIVITAMIN) tablet Take 1 tablet by mouth daily.     naproxen sodium (ALEVE) 220 MG tablet Take 220 mg by mouth daily as needed.     predniSONE (DELTASONE) 10 MG tablet      rosuvastatin (CRESTOR) 20 MG tablet Take 1 tablet (20 mg total) by mouth daily. 90 tablet 3   Vitamin D, Ergocalciferol, (DRISDOL) 1.25 MG (50000 UNIT) CAPS capsule Take one tablet wkly 12 capsule 3   diphenhydrAMINE (BENADRYL) 25 mg capsule Take 25 mg by mouth every 6 (six) hours as needed. (Patient not taking: No sig reported)     No facility-administered medications prior to visit.    No Known Allergies  ROS Review of Systems A fourteen system review of systems was performed and found to be positive as per HPI.   Objective:    Physical Exam General:  Well Developed, well nourished,  appropriate for stated age.  Neuro:  Alert and oriented,  extra-ocular muscles intact, decreased sensation to light touch of left lower leg (lateral) when compared to right lower leg, some instability with tiptoes walking, neg Romberg test HEENT:  Normocephalic, atraumatic, neck supple Skin:  no gross rash, warm, pink. Cardiac:  RRR, S1 S2 Respiratory:  CTA B/L, Not using accessory muscles, speaking in full sentences- unlabored. MSK: good dorsiflexion b/l, no muscle atrophy, good strength and ROM of LE, no tenderness of lumbar or SI joint, supination of both feet noted  Vascular:  Varicose vein noted of left lower leg, good pedal pulses Psych:  No HI/SI, judgement and insight good, Euthymic mood. Full Affect.  BP (!) 147/89   Pulse 84   Temp 98.6 F (37 C)   Ht 6' (1.829 m)   Wt 242 lb 6.4 oz (110 kg)   SpO2 96%   BMI 32.88 kg/m  Wt Readings from Last 3 Encounters:  06/30/21 242 lb 6.4 oz (110 kg)  03/31/21 226 lb (102.5 kg)  03/10/21 208 lb (94.3 kg)     Health Maintenance Due  Topic Date Due   Hepatitis C Screening  Never done   COLONOSCOPY (Pts 45-37yr Insurance coverage will need to be confirmed)  Never done   Zoster Vaccines- Shingrix (1 of 2) Never done   PNA vac Low Risk Adult (1 of 2 - PCV13) Never done   URINE MICROALBUMIN  01/29/2021   COVID-19 Vaccine (4 - Booster for Pfizer series) 01/31/2021   INFLUENZA VACCINE  05/23/2021    There are no preventive care reminders to display for this patient.  Lab Results  Component Value Date   TSH 2.270 11/03/2020   Lab Results  Component Value Date   WBC 5.9 11/03/2020   HGB 15.0 11/03/2020   HCT 43.8 11/03/2020   MCV 91 11/03/2020   PLT 263 11/03/2020   Lab Results  Component Value Date   NA 142 03/07/2021   K 4.7 03/07/2021   CO2 27 03/07/2021   GLUCOSE 100 (H) 03/07/2021   BUN 17 03/07/2021   CREATININE 1.05 03/07/2021   BILITOT 0.6 03/07/2021   ALKPHOS 101 03/07/2021   AST 24 03/07/2021   ALT 44  03/07/2021   PROT 6.9 03/07/2021   ALBUMIN 4.5 03/07/2021   CALCIUM 9.6 03/07/2021   EGFR 78 03/07/2021   Lab Results  Component Value Date   CHOL 151 03/07/2021  Lab Results  Component Value Date   HDL 48 03/07/2021   Lab Results  Component Value Date   LDLCALC 83 03/07/2021   Lab Results  Component Value Date   TRIG 109 03/07/2021   Lab Results  Component Value Date   CHOLHDL 3.1 03/07/2021   Lab Results  Component Value Date   HGBA1C 5.9 (H) 03/07/2021      Assessment & Plan:   Problem List Items Addressed This Visit   None Visit Diagnoses     Paresthesia of left lower extremity    -  Primary   Relevant Medications   gabapentin (NEURONTIN) 100 MG capsule      Paresthesia of left lower extremity: -Discussed with patient s/s are suggestive of neuropathy (peroneal), no foot drop noted on exam. Will collect additional labs with scheduled lab visit to r/o nutritional deficiency (Vitamin B12/folate). Patient wants to trial medication therapy, will start Gabapentin 100 mg BID. Discussed potential side effects including drowsiness. If symptoms fail to improve or worsen recommend further evaluation with nerve conduction study.   Meds ordered this encounter  Medications   gabapentin (NEURONTIN) 100 MG capsule    Sig: Take 1 capsule (100 mg total) by mouth 2 (two) times daily.    Dispense:  180 capsule    Refill:  0    Order Specific Question:   Supervising Provider    Answer:   Beatrice Lecher D [[id number]]    Follow-up: Return if symptoms worsen or fail to improve.    Lorrene Reid, PA-C

## 2021-07-05 ENCOUNTER — Other Ambulatory Visit: Payer: Medicare HMO

## 2021-07-07 ENCOUNTER — Ambulatory Visit: Payer: Medicare HMO | Admitting: Physician Assistant

## 2021-07-12 ENCOUNTER — Other Ambulatory Visit: Payer: Medicare HMO

## 2021-07-14 ENCOUNTER — Ambulatory Visit: Payer: Medicare HMO | Admitting: Physician Assistant

## 2021-07-19 ENCOUNTER — Other Ambulatory Visit: Payer: Medicare HMO

## 2021-07-20 ENCOUNTER — Other Ambulatory Visit: Payer: Self-pay

## 2021-07-20 DIAGNOSIS — E781 Pure hyperglyceridemia: Secondary | ICD-10-CM

## 2021-07-20 DIAGNOSIS — E1169 Type 2 diabetes mellitus with other specified complication: Secondary | ICD-10-CM

## 2021-07-20 DIAGNOSIS — R202 Paresthesia of skin: Secondary | ICD-10-CM

## 2021-07-21 ENCOUNTER — Other Ambulatory Visit: Payer: Medicare HMO

## 2021-07-21 ENCOUNTER — Ambulatory Visit: Payer: Medicare HMO | Admitting: Physician Assistant

## 2021-08-23 ENCOUNTER — Other Ambulatory Visit: Payer: Medicare HMO

## 2021-08-23 DIAGNOSIS — E781 Pure hyperglyceridemia: Secondary | ICD-10-CM | POA: Diagnosis not present

## 2021-08-23 DIAGNOSIS — R202 Paresthesia of skin: Secondary | ICD-10-CM | POA: Diagnosis not present

## 2021-08-23 DIAGNOSIS — E1169 Type 2 diabetes mellitus with other specified complication: Secondary | ICD-10-CM | POA: Diagnosis not present

## 2021-08-23 DIAGNOSIS — R030 Elevated blood-pressure reading, without diagnosis of hypertension: Secondary | ICD-10-CM | POA: Diagnosis not present

## 2021-08-23 NOTE — Addendum Note (Signed)
Addended by: Aron Baba on: 08/23/2021 09:02 AM   Modules accepted: Orders

## 2021-08-24 LAB — COMPREHENSIVE METABOLIC PANEL
ALT: 32 IU/L (ref 0–44)
AST: 21 IU/L (ref 0–40)
Albumin/Globulin Ratio: 2.1 (ref 1.2–2.2)
Albumin: 4.4 g/dL (ref 3.8–4.8)
Alkaline Phosphatase: 81 IU/L (ref 44–121)
BUN/Creatinine Ratio: 16 (ref 10–24)
BUN: 16 mg/dL (ref 8–27)
Bilirubin Total: 0.5 mg/dL (ref 0.0–1.2)
CO2: 28 mmol/L (ref 20–29)
Calcium: 9.3 mg/dL (ref 8.6–10.2)
Chloride: 104 mmol/L (ref 96–106)
Creatinine, Ser: 1.01 mg/dL (ref 0.76–1.27)
Globulin, Total: 2.1 g/dL (ref 1.5–4.5)
Glucose: 125 mg/dL — ABNORMAL HIGH (ref 70–99)
Potassium: 5.1 mmol/L (ref 3.5–5.2)
Sodium: 144 mmol/L (ref 134–144)
Total Protein: 6.5 g/dL (ref 6.0–8.5)
eGFR: 81 mL/min/{1.73_m2} (ref >59)

## 2021-08-24 LAB — CBC WITH DIFFERENTIAL/PLATELET
Basophils Absolute: 0 10*3/uL (ref 0.0–0.2)
Basos: 0 %
EOS (ABSOLUTE): 0.1 10*3/uL (ref 0.0–0.4)
Eos: 2 %
Hematocrit: 44.7 % (ref 37.5–51.0)
Hemoglobin: 15.2 g/dL (ref 13.0–17.7)
Immature Grans (Abs): 0 10*3/uL (ref 0.0–0.1)
Immature Granulocytes: 0 %
Lymphocytes Absolute: 1.1 10*3/uL (ref 0.7–3.1)
Lymphs: 18 %
MCH: 30.3 pg (ref 26.6–33.0)
MCHC: 34 g/dL (ref 31.5–35.7)
MCV: 89 fL (ref 79–97)
Monocytes Absolute: 0.5 10*3/uL (ref 0.1–0.9)
Monocytes: 8 %
Neutrophils Absolute: 4.4 10*3/uL (ref 1.4–7.0)
Neutrophils: 72 %
Platelets: 248 10*3/uL (ref 150–450)
RBC: 5.02 x10E6/uL (ref 4.14–5.80)
RDW: 12.4 % (ref 11.6–15.4)
WBC: 6.1 10*3/uL (ref 3.4–10.8)

## 2021-08-24 LAB — LIPID PANEL
Chol/HDL Ratio: 2.9 ratio (ref 0.0–5.0)
Cholesterol, Total: 126 mg/dL (ref 100–199)
HDL: 43 mg/dL (ref >39)
LDL Chol Calc (NIH): 61 mg/dL (ref 0–99)
Triglycerides: 120 mg/dL (ref 0–149)
VLDL Cholesterol Cal: 22 mg/dL (ref 5–40)

## 2021-08-24 LAB — HEMOGLOBIN A1C
Est. average glucose Bld gHb Est-mCnc: 143 mg/dL
Hgb A1c MFr Bld: 6.6 % — ABNORMAL HIGH (ref 4.8–5.6)

## 2021-08-24 LAB — B12 AND FOLATE PANEL
Folate: >20 ng/mL (ref >3.0)
Vitamin B-12: 713 pg/mL (ref 232–1245)

## 2021-10-13 ENCOUNTER — Other Ambulatory Visit: Payer: Self-pay | Admitting: Physician Assistant

## 2021-10-13 DIAGNOSIS — R202 Paresthesia of skin: Secondary | ICD-10-CM

## 2021-11-07 ENCOUNTER — Other Ambulatory Visit: Payer: Self-pay | Admitting: Physician Assistant

## 2021-11-07 DIAGNOSIS — F341 Dysthymic disorder: Secondary | ICD-10-CM

## 2021-11-18 ENCOUNTER — Other Ambulatory Visit: Payer: Self-pay | Admitting: Physician Assistant

## 2021-11-18 DIAGNOSIS — R202 Paresthesia of skin: Secondary | ICD-10-CM

## 2022-01-06 ENCOUNTER — Encounter: Payer: Self-pay | Admitting: Neurology

## 2022-01-06 ENCOUNTER — Ambulatory Visit: Payer: Medicare HMO | Admitting: Neurology

## 2022-01-06 VITALS — BP 129/87 | HR 96 | Ht 72.0 in | Wt 233.0 lb

## 2022-01-06 DIAGNOSIS — G8194 Hemiplegia, unspecified affecting left nondominant side: Secondary | ICD-10-CM

## 2022-01-06 DIAGNOSIS — Z8679 Personal history of other diseases of the circulatory system: Secondary | ICD-10-CM | POA: Diagnosis not present

## 2022-01-06 DIAGNOSIS — G8114 Spastic hemiplegia affecting left nondominant side: Secondary | ICD-10-CM | POA: Insufficient documentation

## 2022-01-06 DIAGNOSIS — R269 Unspecified abnormalities of gait and mobility: Secondary | ICD-10-CM | POA: Diagnosis not present

## 2022-01-06 MED ORDER — LAMOTRIGINE 100 MG PO TABS: 100.0000 mg | ORAL_TABLET | Freq: Two times a day (BID) | ORAL | 11 refills | Status: DC

## 2022-01-06 MED ORDER — LAMOTRIGINE 25 MG PO TABS: ORAL_TABLET | ORAL | 0 refills | Status: DC

## 2022-01-06 NOTE — Progress Notes (Addendum)
Chief Complaint  Patient presents with   RM 14    Here alone for evaluation of his LLE paresthesia. He states his leg is not working it. It's like if he's walking the odds are fairly good that he's going to drag his foot. He will catch his foot/toe on a rug. He's only fallen once. He feels like he has socks on his feet, a ball under his foot. He cannot wiggle his toes well with his left foot.       ASSESSMENT AND PLAN  Patrick Buchanan is a 71 y.o. 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 Deri Fuelling on February 14, 2022  Patient has multiple cerebral meningioma, the most significant is in his right parasagittal region with compression of his right paracentral lobule and sensorimotor cortex, his superior sagittal sinus appears patent, patient wished to proceed with surgery   DIAGNOSTIC DATA (LABS, IMAGING, TESTING) - I reviewed patient records, labs, notes, testing and imaging myself where available.   MEDICAL HISTORY:  Patrick Buchanan, is a 71 year old male, seen in request by his primary care PA Lorrene Reid, for evaluation of gradual onset of left side difficulty, unsteady gait, initial evaluation was on January 06, 2022.   I reviewed and summarized the referring note. PMHX Depression HLD Cerebral aneurysm surgery in 1992, presented with sudden onset severe headache Cervical sudural benign tumor,in 2005   presented with right arm weakness  He had a history of right brain aneurysm bleeding in 1992, presented with sudden onset severe headaches, status post right craniectomy by Dr. 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 age 75, he also had a history of cervical subdural benign tumor in 2005, presented with right arm weakness, surgical was successful, he denies any focal deficit  He went through very stressful 2022, his wife was diagnosed with pancreatic cancer in summer, and just passed away in 11-22-2021, during that period of time, he was focused on his wife  Only afterwards, he noticed he has left leg difficulty, unsteady gait, denies left arm weakness, but on examination, he has noticeable spastic left upper and lower extremity weakness,  Over the past few months, he also describes intermittent uncontrollable left leg jerking movements lasting 2 to 3 minutes, mild confusion, but never spreading to left arm  He denies incontinence, denies right-sided involvement PHYSICAL EXAM:   Vitals:   01/06/22 1029  BP: 129/87  Pulse: 96  Weight: 233 lb (105.7 kg)  Height: 6' (1.829 m)   Not recorded     Body mass index is 31.6 kg/m.  PHYSICAL EXAMNIATION:  Gen: NAD, conversant, well nourised, well groomed                     Cardiovascular: Regular rate rhythm, no peripheral edema, warm, nontender. Eyes: Conjunctivae clear without exudates or hemorrhage Neck: Supple, no carotid bruits.  Pulmonary: Clear to auscultation bilaterally   NEUROLOGICAL EXAM:  MENTAL STATUS: Speech:    Speech is normal; fluent and spontaneous with normal comprehension.  Cognition:     Orientation to time, place and person     Normal recent and remote memory     Normal Attention span and concentration     Normal Language, naming, repeating,spontaneous speech     Fund of knowledge   CRANIAL NERVES: CN II: Visual fields are full to  confrontation. Pupils are round equal and briskly reactive to light. CN III, IV, VI: extraocular movement are normal. No ptosis. CN V: Facial sensation is intact to light touch CN VII: Face is symmetric with normal eye closure  CN VIII: Hearing is normal to causal conversation. CN IX, X: Phonation is normal. CN XI: Head turning and shoulder shrug are intact  MOTOR: Spastic left hemiparesis, left arm pronation drift, only mild left hip flexion weakness  REFLEXES: Reflexes are 2+ and symmetric at the biceps, triceps, knees, and ankles. Plantar responses are flexor.  SENSORY: Intact to light touch, pinprick and vibratory sensation are intact in fingers and toes.  COORDINATION: There is no trunk or limb dysmetria noted.  GAIT/STANCE: He needs push-up to get up from seated position, left hemicircumferential gait, dragging left leg across the floor Romberg is absent.  REVIEW OF SYSTEMS:  Full 14 system review of systems performed and notable only for as above All other review of systems were negative.   ALLERGIES: No Known Allergies  HOME MEDICATIONS: Current Outpatient Medications  Medication Sig Dispense Refill   chlorpheniramine (CHLOR-TRIMETON) 4 MG tablet Take 4 mg by mouth 2 (two) times daily as needed for allergies.     Cholecalciferol 125 MCG (5000 UT) TABS Take 1 tablet by mouth daily.     escitalopram (LEXAPRO) 20 MG tablet TAKE 1 TABLET (20 MG TOTAL) BY MOUTH DAILY. 90 tablet 1   gabapentin (NEURONTIN) 100 MG capsule TAKE 1 CAPSULE BY MOUTH 2 TIMES DAILY. 180 capsule 0   ibuprofen (ADVIL) 200 MG tablet Take 200 mg by mouth every 6 (six) hours as needed.     Multiple Vitamin (MULTIVITAMIN) tablet Take 1 tablet by mouth daily.     naproxen sodium (ALEVE) 220 MG tablet Take 220 mg by mouth daily as needed.     predniSONE (DELTASONE) 10 MG tablet      rosuvastatin (CRESTOR) 20 MG tablet Take 1 tablet (20 mg total) by mouth daily. 90 tablet 3   Vitamin D, Ergocalciferol,  (DRISDOL) 1.25 MG (50000 UNIT) CAPS capsule Take one tablet wkly 12 capsule 3   No current facility-administered medications for this visit.    PAST MEDICAL HISTORY: Past Medical History:  Diagnosis Date   Allergy    hay fever   Blood transfusion without reported diagnosis    Cerebral aneurysm 1992   "leaking"; treated by Dr Sherwood Gambler   Depression    Depression    [facility] 11/07/2020   Diabetes mellitus without complication ([facility])    GERD (gastroesophageal reflux disease)    past hx     PAST SURGICAL HISTORY: Past Surgical History:  Procedure Laterality Date   APPENDECTOMY     BRAIN SURGERY N/A    [facility] 05/31/2020   CEREBRAL ANEURYSM REPAIR  1992   clipping by Dr Sherwood Gambler; cannot have an MRI   [facility] 05/31/2020   INGUINAL HERNIA REPAIR     x2   TONSILLECTOMY     TUMOR EXCISION  2005   benign cervical    FAMILY HISTORY: Family History  Problem Relation Age of Onset   Breast cancer Mother    Diabetes Brother    Colon cancer Neg Hx    Colon polyps Neg Hx    Esophageal cancer Neg Hx    Rectal cancer Neg Hx    Stomach cancer Neg Hx     SOCIAL HISTORY: Social History   Socioeconomic History   Marital status: Widowed    Spouse name: Not on file   Number of children: 4   Years of education: Not on file   Highest education level: Not on file  Occupational History   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  Tobacco Use   Smoking status: Former    Packs/day: 1.00    Years: 10.00    Pack years: 10.00    Types: Cigarettes    Quit date: 2005    Years since quitting: 18.2   Smokeless tobacco: Never  Vaping Use   Vaping Use: Never used  Substance and Sexual Activity   Alcohol use: Not Currently    Comment: occasionally in past   Drug use: Never   Sexual activity: Not Currently  Other Topics Concern   Not on file  Social History Narrative   Right handed   Widowed recently, has a granddaughter that is staying with him right now   Social  Determinants of Health   Financial Resource Strain: Not on file  Food Insecurity: Not on file  Transportation Needs: Not on file  Physical Activity: Not on file  Stress: Not on file  Social Connections: Not on file  Intimate Partner Violence: Not on file      Marcial Pacas, M.D. Ph.D.  [facility] [address] [place], [place] [address] Ph: [phone] Fax: [phone]  CC:  Lorrene Reid, PA-C [place],  [place] [address]  Lorrene Reid, PA-C

## 2022-01-07 ENCOUNTER — Encounter: Payer: Self-pay | Admitting: Neurology

## 2022-01-09 ENCOUNTER — Telehealth: Payer: Self-pay | Admitting: Neurology

## 2022-01-09 NOTE — Telephone Encounter (Signed)
[employer] pending uploaded notes on the portal  

## 2022-01-09 NOTE — Telephone Encounter (Signed)
[facility]: [id number] for CTA Head and CTA Neck ? ?And [id number] for CT Head ? ?(Exp. 01/09/22 to 02/08/22) ? ?Order sent to GI, they will reach out to the patient to schedule.  ?

## 2022-01-10 ENCOUNTER — Ambulatory Visit: Payer: Medicare HMO

## 2022-01-13 ENCOUNTER — Ambulatory Visit: Payer: Medicare HMO | Attending: Neurology

## 2022-01-13 ENCOUNTER — Other Ambulatory Visit: Payer: Self-pay

## 2022-01-13 DIAGNOSIS — R262 Difficulty in walking, not elsewhere classified: Secondary | ICD-10-CM | POA: Diagnosis not present

## 2022-01-13 DIAGNOSIS — R2681 Unsteadiness on feet: Secondary | ICD-10-CM | POA: Insufficient documentation

## 2022-01-13 DIAGNOSIS — R2689 Other abnormalities of gait and mobility: Secondary | ICD-10-CM | POA: Diagnosis not present

## 2022-01-13 DIAGNOSIS — Z8679 Personal history of other diseases of the circulatory system: Secondary | ICD-10-CM | POA: Insufficient documentation

## 2022-01-13 DIAGNOSIS — M6281 Muscle weakness (generalized): Secondary | ICD-10-CM | POA: Insufficient documentation

## 2022-01-13 DIAGNOSIS — M21372 Foot drop, left foot: Secondary | ICD-10-CM | POA: Diagnosis not present

## 2022-01-13 DIAGNOSIS — R269 Unspecified abnormalities of gait and mobility: Secondary | ICD-10-CM | POA: Insufficient documentation

## 2022-01-13 DIAGNOSIS — G8194 Hemiplegia, unspecified affecting left nondominant side: Secondary | ICD-10-CM | POA: Diagnosis not present

## 2022-01-13 NOTE — Therapy (Signed)
?OUTPATIENT PHYSICAL THERAPY NEURO EVALUATION ? ? ?Patient Name: Patrick Buchanan ?MRN: [id number] ?DOB:14-Mar-1951, 71 y.o., male ?Today's Date: 01/13/2022 ? ?PCP: Lorrene Reid, PA-C ?REFERRING PROVIDER: Marcial Pacas, MD ? ? PT End of Session - 01/13/22 0927   ? ? Visit Number 1   ? Number of Visits 13   ? Date for PT Re-Evaluation 02/24/22   ? Authorization Type [employer] (10th Visit PN)   ? Authorization Time Period Awaiting Authorization   ? Progress Note Due on Visit 10   ? PT Start Time 0930   ? PT Stop Time 1015   ? PT Time Calculation (min) 45 min   ? Activity Tolerance Patient tolerated treatment well   ? Behavior During Therapy [facility] for tasks assessed/performed   ? ?  ?  ? ?  ? ? ?Past Medical History:  ?Diagnosis Date  ? Allergy   ? hay fever  ? Blood transfusion without reported diagnosis   ? Cerebral aneurysm 1992  ? "leaking"; treated by Dr Sherwood Gambler  ? Depression   ? Depression   ? Phreesia 11/07/2020  ? Diabetes mellitus without complication ([place])   ? GERD (gastroesophageal reflux disease)   ? past hx   ? ?Past Surgical History:  ?Procedure Laterality Date  ? APPENDECTOMY    ? BRAIN SURGERY N/A   ? [employer]rees[employer] 05/31/2020  ? CEREBRAL ANEURYSM REPAIR  1992  ? clipping by Dr Sherwood Gambler; cannot have an MRI  ? HERNIA REPAIR N/A   ? [employer]rees[employer] 05/31/2020  ? INGUINAL HERNIA REPAIR    ? x2  ? TONSILLECTOMY    ? TUMOR EXCISION  2005  ? benign cervical  ? ?Patient Active Problem List  ? Diagnosis Date Noted  ? Left hemiparesis ([place]) 01/06/2022  ? History of cerebral aneurysm 01/06/2022  ? Gait abnormality 01/06/2022  ? Mixed diabetic hyperlipidemia associated with type 2 diabetes mellitus ([place]) 11/18/2019  ? Diabetes mellitus ([place]) 11/18/2019  ? Counseling on health promotion and disease prevention 11/18/2019  ? History of smoking for 6-10 years 10/29/2019  ? Pain in right knee 08/25/2019  ? Vitamin D insufficiency 08/11/2019  ? Hypertriglyceridemia 08/11/2019  ? Prediabetes 08/11/2019  ? Serum sodium elevated  08/11/2019  ? dysthymia 10/28/2018  ? (BMI 30.0-34.9) 10/28/2018  ? Elevated blood pressure reading- no dx HTN 10/28/2018  ? Family history of diabetes mellitus (DM) 10/28/2018  ? ? ?ONSET DATE: 01/06/22 (Referral Date) ? ?REFERRING DIAG: G81.94 (ICD-10-CM) - Left hemiparesis ([place]), Z86.79 (ICD-10-CM) - History of cerebral aneurysm, R26.9 (ICD-10-CM) - Gait abnormality ? ?THERAPY DIAG:  ?Muscle weakness (generalized) ? ?Unsteadiness on feet ? ?Other abnormalities of gait and mobility ? ?Foot drop, left ? ?Difficulty in walking, not elsewhere classified ? ?SUBJECTIVE:                                                                                                                                                                          ? ?  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 Began to notice the symptoms in Sept/October, unsure if it was their prior. Reports weakness in the left leg. Reports some altered sensation down the left leg, from the calf down. States feels like he is standing on something on the bottom of the foot at times. Pt reports feels like he catches the left toe and can cause him to lose the balance. Denies pain in the left leg. Also noted some jerkiness in the left leg. Reports was prescribed Lamictal for this.  ?Pt accompanied by: self ? ?PERTINENT HISTORY: Allergy, R Cerebral Aneursym (s/p R craniectomy), Depression, DM, GERD, Cervical sudural benign tumor (2005) ? ?PAIN:  ?Are you having pain?  Reports mild back pain due to sleeping wrong. None in the LLE.  ? ?PRECAUTIONS: Fall and Other: Allergy, R Cerebral Aneursym (s/p R craniectomy), Depression, DM, GERD, Cervical sudural benign tumor (2005) ? ?WEIGHT BEARING RESTRICTIONS No ? ?FALLS: Has patient fallen in last 6 months? Yes, Number of falls: 1 ? ?LIVING ENVIRONMENT: ?Lives with: lives alone; daughter/granddaughter comes over to help in  times throughout  ?Lives in: House/apartment ?Stairs: Yes: Internal: 18 steps; can reach both and External: 3 steps; none; mainly stays on main floor.  ?Has following equipment at home: Single point cane; reports keeps [facility] in the community. Not using the [facility] in the house. ? 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 ?PATIENT GOALS: stop stubbing my toe, improve balance ? ?OBJECTIVE:  ? ?DIAGNOSTIC FINDINGS: CT Head/Neck Scheduled for 4/11 ? ?COGNITION: ?Overall cognitive status: Within functional limits for tasks assessed ?  ?SENSATION: ?Light touch: Intact, but reports diminshed sensation on LLE ? ?COORDINATION: ?Grossly uncoordinated, due to LLE weakness ? ?EDEMA:  ?None noted; reports varicose veins on the LLE.  ? ? ?POSTURE: No Significant postural limitations ? ?MMT:   ? ?MMT Right ?01/13/2022 Left ?01/13/2022  ?Hip flexion 4+/5 4-/5  ?Hip extension    ?Hip abduction 4/5 3+/5  ?Hip adduction    ?Hip internal rotation    ?Hip external rotation    ?Knee flexion 4+/5 3+/5  ?Knee extension 4/5 4-/5  ?Ankle dorsiflexion 4/5 3+/5  ?Ankle plantarflexion    ?Ankle inversion    ?Ankle eversion    ?(Blank rows = not tested) ? ? ?TRANSFERS: ?Assistive device utilized: Single point cane  ?Sit to stand: SBA ?Stand to sit: SBA ? ?STAIRS: ? Level of Assistance: CGA ? Stair Negotiation Technique: Alternating Pattern  with Bilateral Rails ? Number of Stairs: 4  ? Height of Stairs: 6  ?Comments: increased challenge lifting LLE noted, but able to maintain reciprocal pattern ? ?GAIT: ?Gait pattern: step through pattern, decreased step length- Right, decreased step length- Left, decreased stance time- Left, decreased ankle dorsiflexion- Left, Left steppage, lateral lean- Right, and poor foot clearance- Left ?Distance walked: 115 ?Assistive device utilized: Single point cane ?Level of assistance: CGA ?Comments: increased steppage, no catching noted of L foot  but decreased clearance noted ?Gait Speed: 12.57 secs  with SPC = 2.60 ft/sec ? ?FUNCTIONAL TESTs:  ?5 times sit to stand: 11.84 seconds with BUE support, unsteady and heavy lean onto the RLE.  ?Timed up and go (TUG): 12.59 seconds with use of SPC, unsteadiness with turn.  ? ? [facility] PT Assessment - 01/13/22 0001   ? ?  ? Standardized Balance Assessment  ? Standardized Balance Assessment Berg Balance Test   ?  ?  Berg Balance Test  ? Sit to Stand Able to stand  independently using hands   ? Standing Unsupported Able to stand 2 minutes with supervision   ? Sitting with Back Unsupported but Feet Supported on Floor or Stool Able to sit safely and securely 2 minutes   ? Stand to Sit Controls descent by using hands   ? Transfers Able to transfer safely, definite need of hands   ? Standing Unsupported with Eyes Closed Able to stand 10 seconds with supervision   ? Standing Unsupported with Feet Together Needs help to attain position but able to stand for 30 seconds with feet together   ? From Standing, Reach Forward with Outstretched Arm Can reach forward >12 cm safely (5")   5"  ? From Standing Position, Pick up Object from Floor Able to pick up shoe, needs supervision   ? From Standing Position, Turn to Look Behind Over each Shoulder Turn sideways only but maintains balance   ? Turn 360 Degrees Able to turn 360 degrees safely but slowly   ? Standing Unsupported, Alternately Place Feet on Step/Stool Able to complete 4 steps without aid or supervision   ? Standing Unsupported, One Foot in Front Needs help to step but can hold 15 seconds   ? Standing on One Leg Tries to lift leg/unable to hold 3 seconds but remains standing independently   ? Total Score 34   ? Merrilee Jansky comment: 34/56   ? ?  ?  ? ?  ? ? ?PATIENT EDUCATION: ?Education details: Educated on [employer]; Fall Risk, Continue use of SPC ?Person educated: Patient ?Education method: Explanation ?Education comprehension: verbalized understanding ? ? ?HOME EXERCISE PROGRAM: ?To be Established at Next  Visit ? ? ? ?GOALS: ?Goals reviewed with patient? Yes ? ?SHORT TERM GOALS: Target date: 02/03/2022 ? ?Pt will be independent with initial HEP for improved strengthening/balance  ?Baseline: no HEP established ?Goal status: INITIAL

## 2022-01-16 ENCOUNTER — Other Ambulatory Visit: Payer: Self-pay | Admitting: Physician Assistant

## 2022-01-16 DIAGNOSIS — R202 Paresthesia of skin: Secondary | ICD-10-CM

## 2022-01-17 ENCOUNTER — Ambulatory Visit: Payer: Medicare HMO | Admitting: Neurology

## 2022-01-17 DIAGNOSIS — Z8679 Personal history of other diseases of the circulatory system: Secondary | ICD-10-CM

## 2022-01-17 DIAGNOSIS — R269 Unspecified abnormalities of gait and mobility: Secondary | ICD-10-CM

## 2022-01-17 DIAGNOSIS — R259 Unspecified abnormal involuntary movements: Secondary | ICD-10-CM | POA: Diagnosis not present

## 2022-01-17 DIAGNOSIS — G8194 Hemiplegia, unspecified affecting left nondominant side: Secondary | ICD-10-CM

## 2022-01-18 ENCOUNTER — Other Ambulatory Visit: Payer: Self-pay

## 2022-01-18 ENCOUNTER — Ambulatory Visit: Payer: Medicare HMO | Admitting: Physical Therapy

## 2022-01-18 ENCOUNTER — Encounter: Payer: Self-pay | Admitting: Physical Therapy

## 2022-01-18 DIAGNOSIS — R2689 Other abnormalities of gait and mobility: Secondary | ICD-10-CM

## 2022-01-18 DIAGNOSIS — M6281 Muscle weakness (generalized): Secondary | ICD-10-CM

## 2022-01-18 DIAGNOSIS — R2681 Unsteadiness on feet: Secondary | ICD-10-CM | POA: Diagnosis not present

## 2022-01-18 DIAGNOSIS — G8194 Hemiplegia, unspecified affecting left nondominant side: Secondary | ICD-10-CM | POA: Diagnosis not present

## 2022-01-18 DIAGNOSIS — M21372 Foot drop, left foot: Secondary | ICD-10-CM | POA: Diagnosis not present

## 2022-01-18 DIAGNOSIS — R269 Unspecified abnormalities of gait and mobility: Secondary | ICD-10-CM | POA: Diagnosis not present

## 2022-01-18 DIAGNOSIS — R262 Difficulty in walking, not elsewhere classified: Secondary | ICD-10-CM | POA: Diagnosis not present

## 2022-01-18 DIAGNOSIS — Z8679 Personal history of other diseases of the circulatory system: Secondary | ICD-10-CM | POA: Diagnosis not present

## 2022-01-18 NOTE — Patient Instructions (Signed)
Access Code: [email or web address] ?URL: [email or web address] ?Date: 01/18/2022 ?Prepared by: Elease Etienne ? ?Exercises ?- Staggered Sit-to-Stand  - 1 x daily - 4 x weekly - 2 sets - 12 reps ?- Sit to Stand Without Arm Support  - 1 x daily - 4 x weekly - 2 sets - 12 reps ?- Standing March with Counter Support  - 1 x daily - 4 x weekly - 2 sets - 20 reps ?- Side Stepping with Counter Support  - 1 x daily - 4 x weekly - 4 sets - 10 reps ?- Standing Single Leg Stance with Counter Support  - 1 x daily - 4 x weekly - 2 sets - 2 reps - 30 sec hold ?

## 2022-01-18 NOTE — Therapy (Signed)
?OUTPATIENT PHYSICAL THERAPY TREATMENT NOTE ? ? ?Patient Name: Patrick Buchanan ?MRN: [id number] ?DOB:[birth year], 71 y.o., male ?Today's Date: 01/18/2022 ? ?PCP: Lorrene Reid, PA-C ?REFERRING PROVIDER: Lorrene Reid, PA-C ? ? PT End of Session - 01/18/22 1157   ? ? Visit Number 2   ? Number of Visits 13   ? Date for PT Re-Evaluation 02/24/22   ? Authorization Type [employer] (10th Visit PN)   ? Authorization Time Period Awaiting Authorization   ? Progress Note Due on Visit 10   ? PT Start Time 1155   ? PT Stop Time 1234   ? PT Time Calculation (min) 39 min   ? Equipment Utilized During Treatment Gait belt   ? Activity Tolerance Patient tolerated treatment well   ? Behavior During Therapy [facility] for tasks assessed/performed   ? ?  ?  ? ?  ? ? ?Past Medical History:  ?Diagnosis Date  ? Allergy   ? hay fever  ? Blood transfusion without reported diagnosis   ? Cerebral aneurysm 1992  ? "leaking"; treated by Dr Sherwood Gambler  ? Depression   ? Depression   ? Phreesia 11/07/2020  ? Diabetes mellitus without complication ([place])   ? GERD (gastroesophageal reflux disease)   ? past hx   ? ?Past Surgical History:  ?Procedure Laterality Date  ? APPENDECTOMY    ? BRAIN SURGERY N/A   ? Phreesia 05/31/2020  ? CEREBRAL ANEURYSM REPAIR  1992  ? clipping by Dr Sherwood Gambler; cannot have an MRI  ? HERNIA REPAIR N/A   ? Phreesia 05/31/2020  ? INGUINAL HERNIA REPAIR    ? x2  ? TONSILLECTOMY    ? TUMOR EXCISION  2005  ? benign cervical  ? ?Patient Active Problem List  ? Diagnosis Date Noted  ? Left hemiparesis ([place]) 01/06/2022  ? History of cerebral aneurysm 01/06/2022  ? Gait abnormality 01/06/2022  ? Mixed diabetic hyperlipidemia associated with type 2 diabetes mellitus ([place]) 11/18/2019  ? Diabetes mellitus ([place]) 11/18/2019  ? Counseling on health promotion and disease prevention 11/18/2019  ? History of smoking for 6-10 years 10/29/2019  ? Pain in right knee 08/25/2019  ? Vitamin D insufficiency 08/11/2019  ? Hypertriglyceridemia  08/11/2019  ? Prediabetes 08/11/2019  ? Serum sodium elevated 08/11/2019  ? dysthymia 10/28/2018  ? (BMI 30.0-34.9) 10/28/2018  ? Elevated blood pressure reading- no dx HTN 10/28/2018  ? Family history of diabetes mellitus (DM) 10/28/2018  ? ? ?REFERRING DIAG: G81.94 (ICD-10-CM) - Left hemiparesis ([place]), Z86.79 (ICD-10-CM) - History of cerebral aneurysm, R26.9 (ICD-10-CM) - Gait abnormality ? ?THERAPY DIAG:  ?Muscle weakness (generalized) ? ?Unsteadiness on feet ? ?Other abnormalities of gait and mobility ? ?Foot drop, left ? ?PERTINENT HISTORY: Allergy, R Cerebral Aneursym (s/p R craniectomy), Depression, DM, GERD, Cervical sudural benign tumor (2005) ? ?PRECAUTIONS: Fall and Other: Allergy, R Cerebral Aneursym (s/p R craniectomy), Depression, DM, GERD, Cervical sudural benign tumor (2005) ? ?SUBJECTIVE: Pt states he took a fall at home on Tuesday when stepping backwards onto something that wasn't supposed to be there.  He states he was unharmed and is having no soreness or issues that he relates to it. ? ?PAIN:  ?Are you having pain? No ?TODAY'S TREATMENT: ?STS in staggered stance x12 w/ L leg back ?STS w/o hands x12 ?Standing march until fatigue w/ RUE support>fingertip>tripod support; cued for high knees ?SLS at countertop 6x5sec on LLE, x30 on RLE, PT provided taping at quad insertion on last 2 trials on LLE to promote engagement  to task and dec soft knee buckle in stance. ?Side-walking at countertop w/ fingertip support, cued for dec quad compensation with L toe forward ?Alt LE 6" toe taps using fingertip support>6" stair step ups initiated w/ alt LE, finished w/ LLE x10 using BUE support, CGA ?Pt ambulates brief distance in gym on level surface w/o AD to promote balance w/ turns and engagement of LLE, soft buckle intermittently, L foot drop noted.  No overt LOB. ?LOBx1 when rising from mat w/ LLE flexed under mat.  Pt recovers w/ single forward step. ?OBJECTIVE:  ?  ?DIAGNOSTIC FINDINGS: CT Head/Neck  Scheduled for 4/11 ?  ?COGNITION: ?Overall cognitive status: Within functional limits for tasks assessed ?            ?SENSATION: ?Light touch: Intact, but reports diminshed sensation on LLE ?  ?COORDINATION: ?Grossly uncoordinated, due to LLE weakness ?  ?EDEMA:  ?None noted; reports varicose veins on the LLE.  ?  ?  ?POSTURE: No Significant postural limitations ?  ?MMT:   ?  ?MMT Right ?01/13/2022 Left ?01/13/2022  ?Hip flexion 4+/5 4-/5  ?Hip extension      ?Hip abduction 4/5 3+/5  ?Hip adduction      ?Hip internal rotation      ?Hip external rotation      ?Knee flexion 4+/5 3+/5  ?Knee extension 4/5 4-/5  ?Ankle dorsiflexion 4/5 3+/5  ?Ankle plantarflexion      ?Ankle inversion      ?Ankle eversion      ?(Blank rows = not tested) ?  ?  ?TRANSFERS: ?Assistive device utilized: Single point cane  ?Sit to stand: SBA ?Stand to sit: SBA ?  ?STAIRS: ?          Level of Assistance: CGA ?          Stair Negotiation Technique: Alternating Pattern  with Bilateral Rails ?          Number of Stairs: 4   ?          Height of Stairs: 6     ?Comments: increased challenge lifting LLE noted, but able to maintain reciprocal pattern ?  ?GAIT: ?Gait pattern: step through pattern, decreased step length- Right, decreased step length- Left, decreased stance time- Left, decreased ankle dorsiflexion- Left, Left steppage, lateral lean- Right, and poor foot clearance- Left ?Distance walked: 115 ?Assistive device utilized: Single point cane ?Level of assistance: CGA ?Comments: increased steppage, no catching noted of L foot  but decreased clearance noted ?Gait Speed: 12.57 secs with SPC = 2.60 ft/sec ?  ?FUNCTIONAL TESTs:  ?5 times sit to stand: 11.84 seconds with BUE support, unsteady and heavy lean onto the RLE.  ?Timed up and go (TUG): 12.59 seconds with use of SPC, unsteadiness with turn.  ?  ?  [facility] PT Assessment - 01/13/22 0001   ?  ?    ?     ?  Standardized Balance Assessment  ?  Standardized Balance Assessment Berg Balance Test   ?      ?  Berg Balance Test  ?  Sit to Stand Able to stand  independently using hands   ?  Standing Unsupported Able to stand 2 minutes with supervision   ?  Sitting with Back Unsupported but Feet Supported on Floor or Stool Able to sit safely and securely 2 minutes   ?  Stand to Sit Controls descent by using hands   ?  Transfers Able to transfer safely, definite need of hands   ?  Standing Unsupported with Eyes Closed Able to stand 10 seconds with supervision   ?  Standing Unsupported with Feet Together Needs help to attain position but able to stand for 30 seconds with feet together   ?  From Standing, Reach Forward with Outstretched Arm Can reach forward >12 cm safely (5")   5"  ?  From Standing Position, Pick up Object from Floor Able to pick up shoe, needs supervision   ?  From Standing Position, Turn to Look Behind Over each Shoulder Turn sideways only but maintains balance   ?  Turn 360 Degrees Able to turn 360 degrees safely but slowly   ?  Standing Unsupported, Alternately Place Feet on Step/Stool Able to complete 4 steps without aid or supervision   ?  Standing Unsupported, One Foot in Front Needs help to step but can hold 15 seconds   ?  Standing on One Leg Tries to lift leg/unable to hold 3 seconds but remains standing independently   ?  Total Score 34   ?  Merrilee Jansky comment: 34/56   ?  ?   ?  ?  ?   ?  ?  ?PATIENT EDUCATION: ?Education details: Edu on HEP. ?Person educated: Patient ?Education method: Explanation ?Education comprehension: verbalized understanding ?  ?  ?HOME EXERCISE PROGRAM: ?Access Code: [email or web address] ?URL: [email or web address] ?Date: 01/18/2022 ?Prepared by: Elease Etienne ? ?Exercises ?- Staggered Sit-to-Stand  - 1 x daily - 4 x weekly - 2 sets - 12 reps ?- Sit to Stand Without Arm Support  - 1 x daily - 4 x weekly - 2 sets - 12 reps ?- Standing March with Counter Support  - 1 x daily - 4 x weekly - 2 sets - 20 reps ?- Side Stepping with Counter Support  - 1 x daily - 4 x weekly -  4 sets - 10 reps ?- Standing Single Leg Stance with Counter Support  - 1 x daily - 4 x weekly - 2 sets - 2 reps - 30 sec hold ?  ?  ?  ?GOALS: ?Goals reviewed with patient? Yes ?  ?SHORT TERM GOALS: Target date: 4/

## 2022-01-20 ENCOUNTER — Encounter: Payer: Self-pay | Admitting: Neurology

## 2022-01-20 ENCOUNTER — Ambulatory Visit: Payer: Medicare HMO | Admitting: Physical Therapy

## 2022-01-20 DIAGNOSIS — R2681 Unsteadiness on feet: Secondary | ICD-10-CM | POA: Diagnosis not present

## 2022-01-20 DIAGNOSIS — G8194 Hemiplegia, unspecified affecting left nondominant side: Secondary | ICD-10-CM | POA: Diagnosis not present

## 2022-01-20 DIAGNOSIS — R262 Difficulty in walking, not elsewhere classified: Secondary | ICD-10-CM | POA: Diagnosis not present

## 2022-01-20 DIAGNOSIS — M21372 Foot drop, left foot: Secondary | ICD-10-CM

## 2022-01-20 DIAGNOSIS — R2689 Other abnormalities of gait and mobility: Secondary | ICD-10-CM | POA: Diagnosis not present

## 2022-01-20 DIAGNOSIS — M6281 Muscle weakness (generalized): Secondary | ICD-10-CM | POA: Diagnosis not present

## 2022-01-20 DIAGNOSIS — Z8679 Personal history of other diseases of the circulatory system: Secondary | ICD-10-CM | POA: Diagnosis not present

## 2022-01-20 DIAGNOSIS — R269 Unspecified abnormalities of gait and mobility: Secondary | ICD-10-CM | POA: Diagnosis not present

## 2022-01-20 NOTE — Therapy (Signed)
?OUTPATIENT PHYSICAL THERAPY TREATMENT NOTE ? ? ?Patient Name: Patrick Buchanan ?MRN: [id number] ?DOB:1951/04/25, 71 y.o., male ?Today's Date: 01/20/2022 ? ?PCP: Lorrene Reid, PA-C ?REFERRING PROVIDER: Marcial Pacas, MD ? ? PT End of Session - 01/20/22 1019   ? ? Visit Number 3   ? Number of Visits 13   ? Date for PT Re-Evaluation 02/24/22   ? Authorization Type [employer] (10th Visit PN)   ? Authorization Time Period Awaiting Authorization   ? Progress Note Due on Visit 10   ? PT Start Time 1017   ? PT Stop Time 1058   ? PT Time Calculation (min) 41 min   ? Equipment Utilized During Treatment Gait belt   ? Activity Tolerance Patient tolerated treatment well   ? Behavior During Therapy [facility] for tasks assessed/performed   ? ?  ?  ? ?  ? ? ?Past Medical History:  ?Diagnosis Date  ? Allergy   ? hay fever  ? Blood transfusion without reported diagnosis   ? Cerebral aneurysm 1992  ? "leaking"; treated by Dr Sherwood Gambler  ? Depression   ? Depression   ? Phreesia 11/07/2020  ? Diabetes mellitus without complication ([place])   ? GERD (gastroesophageal reflux disease)   ? past hx   ? ?Past Surgical History:  ?Procedure Laterality Date  ? APPENDECTOMY    ? BRAIN SURGERY N/A   ? [employer]reesia 05/31/2020  ? CEREBRAL ANEURYSM REPAIR  1992  ? clipping by Dr Sherwood Gambler; cannot have an MRI  ? HERNIA REPAIR N/A   ? [employer]reesia 05/31/2020  ? INGUINAL HERNIA REPAIR    ? x2  ? TONSILLECTOMY    ? TUMOR EXCISION  2005  ? benign cervical  ? ?Patient Active Problem List  ? Diagnosis Date Noted  ? Left hemiparesis ([place]) 01/06/2022  ? History of cerebral aneurysm 01/06/2022  ? Gait abnormality 01/06/2022  ? Mixed diabetic hyperlipidemia associated with type 2 diabetes mellitus ([place]) 11/18/2019  ? Diabetes mellitus ([place]) 11/18/2019  ? Counseling on health promotion and disease prevention 11/18/2019  ? History of smoking for 6-10 years 10/29/2019  ? Pain in right knee 08/25/2019  ? Vitamin D insufficiency 08/11/2019  ? Hypertriglyceridemia 08/11/2019  ?  Prediabetes 08/11/2019  ? Serum sodium elevated 08/11/2019  ? dysthymia 10/28/2018  ? (BMI 30.0-34.9) 10/28/2018  ? Elevated blood pressure reading- no dx HTN 10/28/2018  ? Family history of diabetes mellitus (DM) 10/28/2018  ? ? ?REFERRING DIAG: G81.94 (ICD-10-CM) - Left hemiparesis ([place]), Z86.79 (ICD-10-CM) - History of cerebral aneurysm, R26.9 (ICD-10-CM) - Gait abnormality ? ?THERAPY DIAG:  ?Muscle weakness (generalized) ? ?Unsteadiness on feet ? ?Other abnormalities of gait and mobility ? ?Foot drop, left ? ?PERTINENT HISTORY: Allergy, R Cerebral Aneursym (s/p R craniectomy), Depression, DM, GERD, Cervical sudural benign tumor (2005) ? ?PRECAUTIONS: Fall and Other: Allergy, R Cerebral Aneursym (s/p R craniectomy), Depression, DM, GERD, Cervical sudural benign tumor (2005) ? ?SUBJECTIVE: No falls. Tried the exercises at home and reports they felt good for his legs (but made them feel like spaghetti). ?PAIN:  ?Are you having pain? No ? ? ?TODAY'S TREATMENT: ?Alt. Step taps to 6" step using fingertip support x12 reps each leg, cues for incr hip/knee flexion with LLE and to not let it scrape on step, trialed a few reps with no UE support with min guard, but then pt stating that he needs to perform w/ fingertips.  ?LLE step ups to 6" step needing single UE support - 2 sets of 10  reps, 1st set w/ pt stepping down first with LLE (easier this way) and 2nd rep pt stepping down first w/ RLE with pt then needing cues to focus on incr hip/knee flexion to clear leg. Cues intermittently for sequencing.  ?Stepping over 4 smaller obstacles 2" or less at countertop, with intermittent UE support, performed with step to pattern first leading with both RLE and LLE (incr difficulty with LLE trailing and cues to slow down and focus on foot clearance) down and back x3 reps, then progressed to alternating pattern down and back 2 reps.  ?On rockerboard in A/P direction: weight shifting x15 reps with BUE support - cued to make sure that  pt to use LLE more, keeping board steady multiple reps without using UE support - max time of holding 15 seconds w/ UE support as needed, in M/L direction: weight shifting x15 reps each side with slowed pace and cued for proper weight shift, needing UE support throughout.  ?In corner with EO: feet together x30 sec, 2 x 5 reps head turns, 2 x 5 reps head nods. Intermittent taps to chair for balance.  ? ? ?GAIT: ?Gait pattern: step through pattern, decreased step length- Right, decreased step length- Left, decreased stance time- Left, decreased ankle dorsiflexion- Left, Left steppage, lateral lean- Right, and poor foot clearance- Left ?Distance walked: small distances around clinic ?Assistive device utilized: None ?Level of assistance: CGA ?Comments: increased steppage and circumduction pattern of LLE, no catching noted of L foot  but decreased clearance noted ? ?  ? ? ?PATIENT EDUCATION: ?Education details: Continue with HEP, showed pt foot up brace and how to put in shoe and purpose of one. Discussed that pt would need to have a pair of lace up shoes to trial. Pt was planning on ordering a pair from [place] and will bring in when he receives them.  ?Person educated: Patient ?Education method: Explanation ?Education comprehension: verbalized understanding ?  ?  ?HOME EXERCISE PROGRAM: ?Access Code: [email or web address] ? ?  ?  ?  ?GOALS: ?Goals reviewed with patient? Yes ?  ?SHORT TERM GOALS: Target date: 02/03/2022 ?  ?Pt will be independent with initial HEP for improved strengthening/balance  ?Baseline: no HEP established ?Goal status: INITIAL ?  ?2.  Pt will improve Berg Balance to >/= 39/56 to demonstrate improved standing balance and reduced fall risk ?Baseline: 34/56 ?Goal status: INITIAL ?  ?3.  Pt will improve TUG to </= 11 secs to demonstrated reduced fall risk ?Baseline: 12.59 secs ?Goal status: INITIAL ?  ?4.  Pt will be able to ambulate >/= 400 ft indoor surfaces with LRAD vs. No AD and no LOB, supervision level to  demo improved household mobility ?Baseline: 115 ft CGA ?Goal status: INITIAL ?  ?  ?LONG TERM GOALS: Target date: 02/24/2022 ?  ?Pt will be independent with final HEP for improved balance/strengthening  ?Baseline: no HEP established ?Goal status: INITIAL ?  ?2.  Pt will improve Berg Balance to >/= 45/56 to demonstrate improved standing balance and reduced fall risk ?Baseline: 34/56 ?Goal status: INITIAL ?  ?3. Pt will improve gait speed to >/= 2.8 ft/sec to demonstrate improved community ambulation ?Baseline: 2.60 ft/sec ?Goal status: INITIAL ?  ?4.  Pt will improve 5x STS to </= 10 sec with equal weightbearing to demo improved functional LE strength and balance  ?Baseline: 11.84 secs ?Goal status: INITIAL ?  ?5.  Pt will be able to ambulate >/= 500 ft with LRAD on unlevel outdoors surfaces and supervision ?Baseline:  115 ft indoors CGA ?Goal status: INITIAL ?  ?6.  Pt will be able to ascend/descend 3 stairs without rails and SPC vs. No AD to allow for improved entry within home ?Baseline: mild unsteadiness, bil rails use ?Goal status: INITIAL ?  ?ASSESSMENT: ?  ?CLINICAL IMPRESSION: ?Pt not wearing lace up shoes so unable to trial foot up brace at session today. Pt plans to order a pair of shoes to trial in a future session for improved LLE foot clearance. Session focused on LLE strengthening, balance, and obstacle negotiation/foot clearance activities. Pt needing cues for L hip knee/flexion during obstacles rather than circumducting leg to clear it.  Continue per POC. ?  ?  ?  ?OBJECTIVE IMPAIRMENTS Abnormal gait, decreased activity tolerance, decreased balance, decreased coordination, decreased endurance, difficulty walking, decreased strength, decreased safety awareness, impaired sensation, and pain.  ?  ?ACTIVITY LIMITATIONS community activity, driving, and yard work.  ?  ?PERSONAL FACTORS Age, Time since onset of injury/illness/exacerbation, and 3+ comorbidities: Allergy, R Cerebral Aneursym (s/p R craniectomy),  Depression, DM, GERD, Cervical sudural benign tumor (2005)  are also affecting patient's functional outcome.  ?  ?  ?REHAB POTENTIAL: Good ?  ?CLINICAL DECISION MAKING: Stable/uncomplicated ?  ?EVALUATIO

## 2022-01-24 ENCOUNTER — Ambulatory Visit: Payer: Medicare HMO | Attending: Neurology

## 2022-01-24 DIAGNOSIS — M6281 Muscle weakness (generalized): Secondary | ICD-10-CM | POA: Insufficient documentation

## 2022-01-24 DIAGNOSIS — M21372 Foot drop, left foot: Secondary | ICD-10-CM | POA: Diagnosis not present

## 2022-01-24 DIAGNOSIS — R262 Difficulty in walking, not elsewhere classified: Secondary | ICD-10-CM | POA: Diagnosis not present

## 2022-01-24 DIAGNOSIS — R2689 Other abnormalities of gait and mobility: Secondary | ICD-10-CM | POA: Insufficient documentation

## 2022-01-24 DIAGNOSIS — R2681 Unsteadiness on feet: Secondary | ICD-10-CM | POA: Diagnosis not present

## 2022-01-24 NOTE — Therapy (Signed)
?OUTPATIENT PHYSICAL THERAPY TREATMENT NOTE ? ? ?Patient Name: Patrick Buchanan ?MRN: [id number] ?DOB:11/07/1950, 71 y.o., male ?Today's Date: 01/24/2022 ? ?PCP: Lorrene Reid, PA-C ?REFERRING PROVIDER: Marcial Pacas, MD ? ? PT End of Session - 01/24/22 1401   ? ? Visit Number 4   ? Number of Visits 13   ? Date for PT Re-Evaluation 02/24/22   ? Authorization Type [employer] (10th Visit PN)   ? Authorization Time Period Awaiting Authorization   ? Progress Note Due on Visit 10   ? PT Start Time 1401   ? PT Stop Time 5093   ? PT Time Calculation (min) 44 min   ? Equipment Utilized During Treatment Gait belt   ? Activity Tolerance Patient tolerated treatment well   ? Behavior During Therapy [facility] for tasks assessed/performed   ? ?  ?  ? ?  ? ? ?Past Medical History:  ?Diagnosis Date  ? Allergy   ? hay fever  ? Blood transfusion without reported diagnosis   ? Cerebral aneurysm 1992  ? "leaking"; treated by Dr Sherwood Gambler  ? Depression   ? Depression   ? Phreesia 11/07/2020  ? Diabetes mellitus without complication ([place])   ? GERD (gastroesophageal reflux disease)   ? past hx   ? ?Past Surgical History:  ?Procedure Laterality Date  ? APPENDECTOMY    ? BRAIN SURGERY N/A   ? Phreesia 05/31/2020  ? CEREBRAL ANEURYSM REPAIR  1992  ? clipping by Dr Sherwood Gambler; cannot have an MRI  ? HERNIA REPAIR N/A   ? Phreesia 05/31/2020  ? INGUINAL HERNIA REPAIR    ? x2  ? TONSILLECTOMY    ? TUMOR EXCISION  2005  ? benign cervical  ? ?Patient Active Problem List  ? Diagnosis Date Noted  ? Left hemiparesis ([place]) 01/06/2022  ? History of cerebral aneurysm 01/06/2022  ? Gait abnormality 01/06/2022  ? Mixed diabetic hyperlipidemia associated with type 2 diabetes mellitus ([place]) 11/18/2019  ? Diabetes mellitus ([place]) 11/18/2019  ? Counseling on health promotion and disease prevention 11/18/2019  ? History of smoking for 6-10 years 10/29/2019  ? Pain in right knee 08/25/2019  ? Vitamin D insufficiency 08/11/2019  ? Hypertriglyceridemia 08/11/2019  ?  Prediabetes 08/11/2019  ? Serum sodium elevated 08/11/2019  ? dysthymia 10/28/2018  ? (BMI 30.0-34.9) 10/28/2018  ? Elevated blood pressure reading- no dx HTN 10/28/2018  ? Family history of diabetes mellitus (DM) 10/28/2018  ? ? ?REFERRING DIAG: G81.94 (ICD-10-CM) - Left hemiparesis ([place]), Z86.79 (ICD-10-CM) - History of cerebral aneurysm, R26.9 (ICD-10-CM) - Gait abnormality ? ?THERAPY DIAG:  ?Muscle weakness (generalized) ? ?Unsteadiness on feet ? ?Other abnormalities of gait and mobility ? ?Difficulty in walking, not elsewhere classified ? ?Foot drop, left ? ?PERTINENT HISTORY: Allergy, R Cerebral Aneursym (s/p R craniectomy), Depression, DM, GERD, Cervical sudural benign tumor (2005) ? ?PRECAUTIONS: Fall and Other: Allergy, R Cerebral Aneursym (s/p R craniectomy), Depression, DM, GERD, Cervical sudural benign tumor (2005) ? ?SUBJECTIVE: Patient reports having no new changes/complaints. Patient feels like he is picking up the foot better but believes he is landing on the outside of his foot.  ? ?PAIN:  ?Are you having pain? No ? ? ?TODAY'S TREATMENT: ?TherEx: ?Seated: Completed Resisted DF with red therabands on LLE 2 x 10 reps, cues for eccentric control for improved strengthening.  Added to HEP and provided new handout.  ? ?NMR: ?Standing Toe Taps to 6" Step, completed x 10 reps bilaterally with UE support, then after rest break completed  x 10 reps without UE support. CGA intermittent needed without use of UE support due to imbalance. SLS challenge noted on LLE > RLE.  ? ?Sit <> Stands without UE support and feet equal position from mat x 10 reps. Mild fatigue noted requiring rest break ? ?Standing Balance: ?Surface: Airex ?Position: Wide Base of Support ?Completed with: Eyes Open and Eyes Closed; Head Turns x 10 Reps and Head Nods x 10 Reps completed with eyes open. Trialed standing with with wide BOS 3 x 30 seconds. Cues for improved weight shift onto LLE.  ? ?GAIT: ?Gait pattern: step through pattern,  decreased step length- Right, decreased step length- Left, decreased stance time- Left, decreased ankle dorsiflexion- Left, Left steppage, lateral lean- Right, and poor foot clearance- Left ?Distance walked: 230 ft  ?Assistive device utilized: None ?Level of assistance: CGA ?Comments: increased steppage of LLE, no catching noted of L foot but decreased clearance noted. Cues to avoid circumduction with gait. Unable to complete gait with foot up brace due to no shoes with laces brought to session. Planned to bring to future session when receive in mail.  ?  ?Updated HEP:  ?Access Code: [email or web address] ?URL: [email or web address] ?Date: 01/24/2022 ?Prepared by: Baldomero Lamy ? ?Exercises ?- Staggered Sit-to-Stand  - 1 x daily - 4 x weekly - 2 sets - 12 reps ?- Sit to Stand Without Arm Support  - 1 x daily - 4 x weekly - 2 sets - 12 reps ?- Standing March with Counter Support  - 1 x daily - 4 x weekly - 2 sets - 20 reps ?- Side Stepping with Counter Support  - 1 x daily - 4 x weekly - 4 sets - 10 reps ?- Standing Single Leg Stance with Counter Support  - 1 x daily - 4 x weekly - 2 sets - 2 reps - 30 sec hold ?- Seated Ankle Dorsiflexion with Anchored Resistance  - 1 x daily - 4 x weekly - 2 sets - 10 reps ? ? ?PATIENT EDUCATION: ?Education details: HEP Update ?Person educated: Patient ?Education method: Explanation ?Education comprehension: verbalized understanding ?  ?  ?HOME EXERCISE PROGRAM: ?Access Code: [email or web address] ? ?  ?  ?  ?GOALS: ?Goals reviewed with patient? Yes ?  ?SHORT TERM GOALS: Target date: 02/03/2022 ?  ?Pt will be independent with initial HEP for improved strengthening/balance  ?Baseline: no HEP established ?Goal status: INITIAL ?  ?2.  Pt will improve Berg Balance to >/= 39/56 to demonstrate improved standing balance and reduced fall risk ?Baseline: 34/56 ?Goal status: INITIAL ?  ?3.  Pt will improve TUG to </= 11 secs to demonstrated reduced fall risk ?Baseline: 12.59 secs ?Goal status: INITIAL ?   ?4.  Pt will be able to ambulate >/= 400 ft indoor surfaces with LRAD vs. No AD and no LOB, supervision level to demo improved household mobility ?Baseline: 115 ft CGA ?Goal status: INITIAL ?  ?  ?LONG TERM GOALS: Target date: 02/24/2022 ?  ?Pt will be independent with final HEP for improved balance/strengthening  ?Baseline: no HEP established ?Goal status: INITIAL ?  ?2.  Pt will improve Berg Balance to >/= 45/56 to demonstrate improved standing balance and reduced fall risk ?Baseline: 34/56 ?Goal status: INITIAL ?  ?3. Pt will improve gait speed to >/= 2.8 ft/sec to demonstrate improved community ambulation ?Baseline: 2.60 ft/sec ?Goal status: INITIAL ?  ?4.  Pt will improve 5x STS to </= 10 sec with equal weightbearing to demo improved functional LE strength and  balance  ?Baseline: 11.84 secs ?Goal status: INITIAL ?  ?5.  Pt will be able to ambulate >/= 500 ft with LRAD on unlevel outdoors surfaces and supervision ?Baseline: 115 ft indoors CGA ?Goal status: INITIAL ?  ?6.  Pt will be able to ascend/descend 3 stairs without rails and SPC vs. No AD to allow for improved entry within home ?Baseline: mild unsteadiness, bil rails use ?Goal status: INITIAL ?  ?ASSESSMENT: ?  ?CLINICAL IMPRESSION: ?Still have not received shoes to trial foot up braces. Continued rest of session focused on gait training working on reduced circumduction and LLE strengthening and activities to improve stance and weight shift on LLE. Will continue per POC.  ?  ?  ?  ?OBJECTIVE IMPAIRMENTS Abnormal gait, decreased activity tolerance, decreased balance, decreased coordination, decreased endurance, difficulty walking, decreased strength, decreased safety awareness, impaired sensation, and pain.  ?  ?ACTIVITY LIMITATIONS community activity, driving, and yard work.  ?  ?PERSONAL FACTORS Age, Time since onset of injury/illness/exacerbation, and 3+ comorbidities: Allergy, R Cerebral Aneursym (s/p R craniectomy), Depression, DM, GERD, Cervical  sudural benign tumor (2005)  are also affecting patient's functional outcome.  ?  ?  ?REHAB POTENTIAL: Good ?  ?CLINICAL DECISION MAKING: Stable/uncomplicated ?  ?EVALUATION COMPLEXITY: Low ?  ?PLAN: ?PT FREQUEN

## 2022-01-25 NOTE — Procedures (Signed)
? ?  HISTORY: 71 year old male, with history of right brain aneurysm, intermittent left leg jerking ? ?TECHNIQUE:  ?This is a routine 16 channel EEG recording with one channel devoted to a limited EKG recording.  It was performed during wakefulness, drowsiness and asleep.  Hyperventilation and photic stimulation were performed as activating procedures.  There are frequent eye blinking and bilateral frontal muscle artifact noted. ? ?Upon maximum arousal, posterior dominant waking rhythm consistent of mildly dysrhythmic alpha range activity. Activities are symmetric over the bilateral posterior derivations and attenuated with eye opening. ? ?Hyperventilation produced mild/moderate buildup with higher amplitude and the slower activities noted. ? ?Photic stimulation did not alter the tracing. ? ?During EEG recording, patient developed drowsiness and no deeper stage of sleep was achieved. ?During EEG recording, there was no epileptiform discharge noted. ? ?EKG demonstrate sinus rhythm, with heart rate of 84 bpm ? ?CONCLUSION: ?This is a  normal awake EEG.  There is no electrodiagnostic evidence of epileptiform discharge. ? ?Marcial Pacas, M.D. Ph.D. ? ?[facility] ?[place], [place] [address] ?Phone: [phone] ?Fax:      [phone]  ?

## 2022-01-26 ENCOUNTER — Ambulatory Visit: Payer: Medicare HMO

## 2022-01-26 DIAGNOSIS — R2689 Other abnormalities of gait and mobility: Secondary | ICD-10-CM | POA: Diagnosis not present

## 2022-01-26 DIAGNOSIS — M6281 Muscle weakness (generalized): Secondary | ICD-10-CM | POA: Diagnosis not present

## 2022-01-26 DIAGNOSIS — R262 Difficulty in walking, not elsewhere classified: Secondary | ICD-10-CM | POA: Diagnosis not present

## 2022-01-26 DIAGNOSIS — R2681 Unsteadiness on feet: Secondary | ICD-10-CM | POA: Diagnosis not present

## 2022-01-26 DIAGNOSIS — M21372 Foot drop, left foot: Secondary | ICD-10-CM

## 2022-01-26 NOTE — Therapy (Signed)
?OUTPATIENT PHYSICAL THERAPY TREATMENT NOTE ? ? ?Patient Name: Patrick Buchanan ?MRN: [id number] ?DOB:12/14/1950, 71 y.o., male ?Today's Date: 01/26/2022 ? ?PCP: Lorrene Reid, PA-C ?REFERRING PROVIDER: Lorrene Reid, PA-C ? ? PT End of Session - 01/26/22 1318   ? ? Visit Number 5   ? Number of Visits 13   ? Date for PT Re-Evaluation 02/24/22   ? Authorization Type [employer] (10th Visit PN)   ? Authorization Time Period Awaiting Authorization   ? Progress Note Due on Visit 10   ? PT Start Time 7026   ? PT Stop Time 1358   ? PT Time Calculation (min) 41 min   ? Equipment Utilized During Treatment Gait belt   ? Activity Tolerance Patient tolerated treatment well   ? Behavior During Therapy [facility] for tasks assessed/performed   ? ?  ?  ? ?  ? ? ?Past Medical History:  ?Diagnosis Date  ? Allergy   ? hay fever  ? Blood transfusion without reported diagnosis   ? Cerebral aneurysm 1992  ? "leaking"; treated by Dr Sherwood Gambler  ? Depression   ? Depression   ? Phreesia 11/07/2020  ? Diabetes mellitus without complication ([place])   ? GERD (gastroesophageal reflux disease)   ? past hx   ? ?Past Surgical History:  ?Procedure Laterality Date  ? APPENDECTOMY    ? BRAIN SURGERY N/A   ? Phreesia 05/31/2020  ? CEREBRAL ANEURYSM REPAIR  1992  ? clipping by Dr Sherwood Gambler; cannot have an MRI  ? HERNIA REPAIR N/A   ? Phreesia 05/31/2020  ? INGUINAL HERNIA REPAIR    ? x2  ? TONSILLECTOMY    ? TUMOR EXCISION  2005  ? benign cervical  ? ?Patient Active Problem List  ? Diagnosis Date Noted  ? Left hemiparesis ([place]) 01/06/2022  ? History of cerebral aneurysm 01/06/2022  ? Gait abnormality 01/06/2022  ? Mixed diabetic hyperlipidemia associated with type 2 diabetes mellitus ([place]) 11/18/2019  ? Diabetes mellitus ([place]) 11/18/2019  ? Counseling on health promotion and disease prevention 11/18/2019  ? History of smoking for 6-10 years 10/29/2019  ? Pain in right knee 08/25/2019  ? Vitamin D insufficiency 08/11/2019  ? Hypertriglyceridemia  08/11/2019  ? Prediabetes 08/11/2019  ? Serum sodium elevated 08/11/2019  ? dysthymia 10/28/2018  ? (BMI 30.0-34.9) 10/28/2018  ? Elevated blood pressure reading- no dx HTN 10/28/2018  ? Family history of diabetes mellitus (DM) 10/28/2018  ? ? ?REFERRING DIAG: G81.94 (ICD-10-CM) - Left hemiparesis ([place]), Z86.79 (ICD-10-CM) - History of cerebral aneurysm, R26.9 (ICD-10-CM) - Gait abnormality ? ?THERAPY DIAG:  ?Muscle weakness (generalized) ? ?Unsteadiness on feet ? ?Other abnormalities of gait and mobility ? ?Foot drop, left ? ?Difficulty in walking, not elsewhere classified ? ?PERTINENT HISTORY: Allergy, R Cerebral Aneursym (s/p R craniectomy), Depression, DM, GERD, Cervical sudural benign tumor (2005) ? ?PRECAUTIONS: Fall and Other: Allergy, R Cerebral Aneursym (s/p R craniectomy), Depression, DM, GERD, Cervical sudural benign tumor (2005) ? ?SUBJECTIVE: Patient reports no new changes. Two days after last session patient reports felt really weak and was shaky. No falls.  ? ?PAIN:  ?Are you having pain? No ? ? ?TODAY'S TREATMENT: ? ?TherEx: ? Completed SciFit with BUE/BLE on Level 2.0 x  7 minutes. Cues to keep LLE in neutral alignment with completion. Cues to maintain steps per minute >/= 75. Use of black band to help keep neutral alignment due to frequent hitting of the knee on handle.  ? ?NMR: ?Marching: at countertop, completed ambulation forward  with alternating marching and single UE support x 4 laps. Cues to keep LLE under him with turn for improved safety and weight shift.   ?Obstacle Negotiation: worked on single stepping anterior/posterior over black balance beam with single UE x 10 reps working on improved hip/knee flexion, then transitioned to working on Secondary school teacher with multiple black balance beam, completed x 4 laps down and back , improved clearance when leading with LLE > LLE trailing.  ? ? ? ?Standing Balance: ?Surface: Airex ?Position: Wide Base of Support ?Completed with: Eyes Open and  Eyes Closed; Head Turns x 10 Reps and Head Nods x 10 Reps completed with eyes open and wide BOS. standing with with wide BOS 2 x 30 seconds. Cues for improved weight shift onto LLE, as patient tends to keep weight shifted on RLE.  ? ?GAIT: ?Gait pattern: step through pattern, decreased step length- Right, decreased step length- Left, decreased stance time- Left, decreased ankle dorsiflexion- Left, Left steppage, lateral lean- Right, and poor foot clearance- Left ?Distance walked: throughout therapy gym ?Assistive device utilized: None ?Level of assistance: CGA ?Comments: still has not received shoes to try foot up brace.  ?  ? ? ?PATIENT EDUCATION: ?Education details: Continue HEP ?Person educated: Patient ?Education method: Explanation ?Education comprehension: verbalized understanding ?  ?  ?HOME EXERCISE PROGRAM: ?Access Code: [email or web address] ? ?  ?  ?  ?GOALS: ?Goals reviewed with patient? Yes ?  ?SHORT TERM GOALS: Target date: 02/03/2022 ?  ?Pt will be independent with initial HEP for improved strengthening/balance  ?Baseline: no HEP established ?Goal status: INITIAL ?  ?2.  Pt will improve Berg Balance to >/= 39/56 to demonstrate improved standing balance and reduced fall risk ?Baseline: 34/56 ?Goal status: INITIAL ?  ?3.  Pt will improve TUG to </= 11 secs to demonstrated reduced fall risk ?Baseline: 12.59 secs ?Goal status: INITIAL ?  ?4.  Pt will be able to ambulate >/= 400 ft indoor surfaces with LRAD vs. No AD and no LOB, supervision level to demo improved household mobility ?Baseline: 115 ft CGA ?Goal status: INITIAL ?  ?  ?LONG TERM GOALS: Target date: 02/24/2022 ?  ?Pt will be independent with final HEP for improved balance/strengthening  ?Baseline: no HEP established ?Goal status: INITIAL ?  ?2.  Pt will improve Berg Balance to >/= 45/56 to demonstrate improved standing balance and reduced fall risk ?Baseline: 34/56 ?Goal status: INITIAL ?  ?3. Pt will improve gait speed to >/= 2.8 ft/sec to demonstrate  improved community ambulation ?Baseline: 2.60 ft/sec ?Goal status: INITIAL ?  ?4.  Pt will improve 5x STS to </= 10 sec with equal weightbearing to demo improved functional LE strength and balance  ?Baseline: 11.84 secs ?Goal status: INITIAL ?  ?5.  Pt will be able to ambulate >/= 500 ft with LRAD on unlevel outdoors surfaces and supervision ?Baseline: 115 ft indoors CGA ?Goal status: INITIAL ?  ?6.  Pt will be able to ascend/descend 3 stairs without rails and SPC vs. No AD to allow for improved entry within home ?Baseline: mild unsteadiness, bil rails use ?Goal status: INITIAL ?  ?ASSESSMENT: ?  ?CLINICAL IMPRESSION: ?Still have not received shoes at this time. Will continue to benefit from AFO or foot up brace assessment due to gait abnormalities. Continued activities focused on improved balance and strengthening of LLE > RLE. Will continue per POC.  ?  ?  ?  ?OBJECTIVE IMPAIRMENTS Abnormal gait, decreased activity tolerance, decreased balance, decreased coordination, decreased endurance, difficulty walking,  decreased strength, decreased safety awareness, impaired sensation, and pain.  ?  ?ACTIVITY LIMITATIONS community activity, driving, and yard work.  ?  ?PERSONAL FACTORS Age, Time since onset of injury/illness/exacerbation, and 3+ comorbidities: Allergy, R Cerebral Aneursym (s/p R craniectomy), Depression, DM, GERD, Cervical sudural benign tumor (2005)  are also affecting patient's functional outcome.  ?  ?  ?REHAB POTENTIAL: Good ?  ?CLINICAL DECISION MAKING: Stable/uncomplicated ?  ?EVALUATION COMPLEXITY: Low ?  ?PLAN: ?PT FREQUENCY: 2x/week ?  ?PT DURATION: 6 weeks ?  ?PLANNED INTERVENTIONS: Therapeutic exercises, Therapeutic activity, Neuromuscular re-education, Balance training, Gait training, Patient/Family education, Joint mobilization, Stair training, Orthotic/Fit training, DME instructions, and Manual therapy ?  ?PLAN FOR NEXT SESSION: Likes to go by [name]. Modify balance/strengthening HEP prn-  specifically targeting LLE. May need to trial foot up brace at some point (pt was going to order a pair of lace up shoes).  Gait training w/ vs w/o SPC using foot up brace.  Backwards walking, hurdles, narrow BOS, trial use of

## 2022-01-30 ENCOUNTER — Ambulatory Visit: Payer: Medicare HMO

## 2022-01-30 DIAGNOSIS — M21372 Foot drop, left foot: Secondary | ICD-10-CM

## 2022-01-30 DIAGNOSIS — M6281 Muscle weakness (generalized): Secondary | ICD-10-CM

## 2022-01-30 DIAGNOSIS — R262 Difficulty in walking, not elsewhere classified: Secondary | ICD-10-CM | POA: Diagnosis not present

## 2022-01-30 DIAGNOSIS — R2681 Unsteadiness on feet: Secondary | ICD-10-CM

## 2022-01-30 DIAGNOSIS — R2689 Other abnormalities of gait and mobility: Secondary | ICD-10-CM

## 2022-01-30 NOTE — Therapy (Signed)
?OUTPATIENT PHYSICAL THERAPY TREATMENT NOTE ? ? ?Patient Name: Patrick Buchanan ?MRN: [id number] ?DOB:October 10, 1951, 71 y.o., male ?Today's Date: 01/30/2022 ? ?PCP: Lorrene Reid, PA-C ?REFERRING PROVIDER: Lorrene Reid, PA-C ? ? PT End of Session - 01/30/22 1316   ? ? Visit Number 6   ? Number of Visits 13   ? Date for PT Re-Evaluation 02/24/22   ? Authorization Type [employer] (10th Visit PN)   ? Authorization Time Period Awaiting Authorization   ? Progress Note Due on Visit 10   ? PT Start Time 1316   ? PT Stop Time 1400   ? PT Time Calculation (min) 44 min   ? Equipment Utilized During Treatment Gait belt   ? Activity Tolerance Patient tolerated treatment well   ? Behavior During Therapy [facility] for tasks assessed/performed   ? ?  ?  ? ?  ? ? ?Past Medical History:  ?Diagnosis Date  ? Allergy   ? hay fever  ? Blood transfusion without reported diagnosis   ? Cerebral aneurysm 1992  ? "leaking"; treated by Dr Sherwood Gambler  ? Depression   ? Depression   ? Phreesia 11/07/2020  ? Diabetes mellitus without complication ([place])   ? GERD (gastroesophageal reflux disease)   ? past hx   ? ?Past Surgical History:  ?Procedure Laterality Date  ? APPENDECTOMY    ? BRAIN SURGERY N/A   ? Phreesia 05/31/2020  ? CEREBRAL ANEURYSM REPAIR  1992  ? clipping by Dr Sherwood Gambler; cannot have an MRI  ? HERNIA REPAIR N/A   ? Phreesia 05/31/2020  ? INGUINAL HERNIA REPAIR    ? x2  ? TONSILLECTOMY    ? TUMOR EXCISION  2005  ? benign cervical  ? ?Patient Active Problem List  ? Diagnosis Date Noted  ? Left hemiparesis ([place]) 01/06/2022  ? History of cerebral aneurysm 01/06/2022  ? Gait abnormality 01/06/2022  ? Mixed diabetic hyperlipidemia associated with type 2 diabetes mellitus ([place]) 11/18/2019  ? Diabetes mellitus ([place]) 11/18/2019  ? Counseling on health promotion and disease prevention 11/18/2019  ? History of smoking for 6-10 years 10/29/2019  ? Pain in right knee 08/25/2019  ? Vitamin D insufficiency 08/11/2019  ? Hypertriglyceridemia  08/11/2019  ? Prediabetes 08/11/2019  ? Serum sodium elevated 08/11/2019  ? dysthymia 10/28/2018  ? (BMI 30.0-34.9) 10/28/2018  ? Elevated blood pressure reading- no dx HTN 10/28/2018  ? Family history of diabetes mellitus (DM) 10/28/2018  ? ? ?REFERRING DIAG: G81.94 (ICD-10-CM) - Left hemiparesis ([place]), Z86.79 (ICD-10-CM) - History of cerebral aneurysm, R26.9 (ICD-10-CM) - Gait abnormality ? ?THERAPY DIAG:  ?Muscle weakness (generalized) ? ?Unsteadiness on feet ? ?Other abnormalities of gait and mobility ? ?Difficulty in walking, not elsewhere classified ? ?Foot drop, left ? ?PERTINENT HISTORY: Allergy, R Cerebral Aneursym (s/p R craniectomy), Depression, DM, GERD, Cervical sudural benign tumor (2005) ? ?PRECAUTIONS: Fall and Other: Allergy, R Cerebral Aneursym (s/p R craniectomy), Depression, DM, GERD, Cervical sudural benign tumor (2005) ? ?SUBJECTIVE: Patient reports that he received tennis shoes but are extra grippy and causing him to catch the toes. Reports he wants to get a new pair. Patient reports that sometimes feels like doing the heel raises well, but sometimes it is more challenging.   ? ?PAIN:  ?Are you having pain? No ? ? ?TODAY'S TREATMENT: ? ?TherEx: ?Completed SciFit with BUE/BLE on Level 2.0 x 8 minutes. Cues to maintain steps per minute >/= 75. Use of black band to help keep neutral alignment due to frequent hitting of  the knee on handle. Patient able to maintain a good pace.  ? ?Completed Sit <> Stands with LLE positioned posterior x 10 reps. PT providing blockage to RLE to avoid it scooting posterior. Then completed second set x 10 reps with BLE even. Patient tolerating well, cues for upright posture with completion.  ? ?Lower Extremity Stretching:  ? Gastroc: LLE; Position: seated Reps: 2 Time: 30 seconds ; Trialed Standing Gastroc stretch but increased challenge maintaining LLE in proper position for stretch. Added to HEP.  ? ?Lower Extremity Strengthening:  ? Forward Step Ups: LLE, 6", Sets:  2, Reps: 10 reps; first set completed with BUE support. Then second set reduced UE support and completed with single UE. Increased challenge noted. Cues for standing up tall posture with completion and equal weight shift, as patient tends to want to keep weight shifted on RLE.  ? ?Added Gastroc Stretch to HEP (See Below; Bolded are New Additions):  ?Access Code: [email or web address] ?URL: [email or web address] ?Date: 01/30/2022 ?Prepared by: Baldomero Lamy ? ?Exercises ?- Staggered Sit-to-Stand  - 1 x daily - 4 x weekly - 2 sets - 12 reps ?- Sit to Stand Without Arm Support  - 1 x daily - 4 x weekly - 2 sets - 12 reps ?- Standing March with Counter Support  - 1 x daily - 4 x weekly - 2 sets - 20 reps ?- Side Stepping with Counter Support  - 1 x daily - 4 x weekly - 4 sets - 10 reps ?- Standing Single Leg Stance with Counter Support  - 1 x daily - 4 x weekly - 2 sets - 2 reps - 30 sec hold ?- Seated Ankle Dorsiflexion with Anchored Resistance  - 1 x daily - 4 x weekly - 2 sets - 10 reps ?- Seated Gastroc Stretch with Strap  - 1 x daily - 5 x weekly - 1 sets - 3 reps - 30 seconds hold ? ?GAIT: ?Gait pattern: step through pattern, decreased step length- Right, decreased step length- Left, decreased stance time- Left, decreased ankle dorsiflexion- Left, Left steppage, lateral lean- Right, and poor foot clearance- Left ?Distance walked: throughout therapy gym ?Assistive device utilized: None ?Level of assistance: CGA ?Comments: PT educating on options for toe cap if needed.  ?  ? ? ?PATIENT EDUCATION: ?Education details: Continue HEP ?Person educated: Patient ?Education method: Explanation ?Education comprehension: verbalized understanding ?  ?  ?HOME EXERCISE PROGRAM: ?Access Code: [email or web address] ? ?  ?  ?  ?GOALS: ?Goals reviewed with patient? Yes ?  ?SHORT TERM GOALS: Target date: 02/03/2022 ?  ?Pt will be independent with initial HEP for improved strengthening/balance  ?Baseline: no HEP established ?Goal status: INITIAL ?   ?2.  Pt will improve Berg Balance to >/= 39/56 to demonstrate improved standing balance and reduced fall risk ?Baseline: 34/56 ?Goal status: INITIAL ?  ?3.  Pt will improve TUG to </= 11 secs to demonstrated reduced fall risk ?Baseline: 12.59 secs ?Goal status: INITIAL ?  ?4.  Pt will be able to ambulate >/= 400 ft indoor surfaces with LRAD vs. No AD and no LOB, supervision level to demo improved household mobility ?Baseline: 115 ft CGA ?Goal status: INITIAL ?  ?  ?LONG TERM GOALS: Target date: 02/24/2022 ?  ?Pt will be independent with final HEP for improved balance/strengthening  ?Baseline: no HEP established ?Goal status: INITIAL ?  ?2.  Pt will improve Berg Balance to >/= 45/56 to demonstrate improved standing balance and reduced fall risk ?Baseline: 34/56 ?Goal  status: INITIAL ?  ?3. Pt will improve gait speed to >/= 2.8 ft/sec to demonstrate improved community ambulation ?Baseline: 2.60 ft/sec ?Goal status: INITIAL ?  ?4.  Pt will improve 5x STS to </= 10 sec with equal weightbearing to demo improved functional LE strength and balance  ?Baseline: 11.84 secs ?Goal status: INITIAL ?  ?5.  Pt will be able to ambulate >/= 500 ft with LRAD on unlevel outdoors surfaces and supervision ?Baseline: 115 ft indoors CGA ?Goal status: INITIAL ?  ?6.  Pt will be able to ascend/descend 3 stairs without rails and SPC vs. No AD to allow for improved entry within home ?Baseline: mild unsteadiness, bil rails use ?Goal status: INITIAL ?  ?ASSESSMENT: ?  ?CLINICAL IMPRESSION: ?Today's skilled PT session focused on continued strengthening and activities working on weight shift to LLE. Pt tolerating well with intermittent rest breaks due to fatigue. Continue to require cues for proper positioning of LLE with sit <> stands and ambulation. Will continue per POC.  ?  ?  ?  ?OBJECTIVE IMPAIRMENTS Abnormal gait, decreased activity tolerance, decreased balance, decreased coordination, decreased endurance, difficulty walking, decreased  strength, decreased safety awareness, impaired sensation, and pain.  ?  ?ACTIVITY LIMITATIONS community activity, driving, and yard work.  ?  ?PERSONAL FACTORS Age, Time since onset of injury/illness/exacerbati

## 2022-01-30 NOTE — Patient Instructions (Signed)
Access Code: [email or web address] ?URL: [email or web address] ?Date: 01/30/2022 ?Prepared by: Baldomero Lamy ? ?Exercises ?- Staggered Sit-to-Stand  - 1 x daily - 4 x weekly - 2 sets - 12 reps ?- Sit to Stand Without Arm Support  - 1 x daily - 4 x weekly - 2 sets - 12 reps ?- Standing March with Counter Support  - 1 x daily - 4 x weekly - 2 sets - 20 reps ?- Side Stepping with Counter Support  - 1 x daily - 4 x weekly - 4 sets - 10 reps ?- Standing Single Leg Stance with Counter Support  - 1 x daily - 4 x weekly - 2 sets - 2 reps - 30 sec hold ?- Seated Ankle Dorsiflexion with Anchored Resistance  - 1 x daily - 4 x weekly - 2 sets - 10 reps ?- Seated Gastroc Stretch with Strap  - 1 x daily - 5 x weekly - 1 sets - 3 reps - 30 seconds hold ?

## 2022-01-31 ENCOUNTER — Inpatient Hospital Stay: Admission: RE | Admit: 2022-01-31 | Payer: Medicare HMO | Source: Ambulatory Visit

## 2022-01-31 ENCOUNTER — Other Ambulatory Visit: Payer: Medicare HMO

## 2022-02-01 ENCOUNTER — Ambulatory Visit: Payer: Medicare HMO | Admitting: Physical Therapy

## 2022-02-07 ENCOUNTER — Ambulatory Visit: Payer: Medicare HMO

## 2022-02-09 ENCOUNTER — Ambulatory Visit: Payer: Medicare HMO

## 2022-02-10 ENCOUNTER — Telehealth: Payer: Self-pay | Admitting: Neurology

## 2022-02-10 ENCOUNTER — Ambulatory Visit
Admission: RE | Admit: 2022-02-10 | Discharge: 2022-02-10 | Disposition: A | Discharge status: Home / Self Care | Payer: Medicare HMO | Source: Ambulatory Visit | Attending: Neurology | Admitting: Neurology

## 2022-02-10 DIAGNOSIS — R269 Unspecified abnormalities of gait and mobility: Secondary | ICD-10-CM

## 2022-02-10 DIAGNOSIS — R202 Paresthesia of skin: Secondary | ICD-10-CM | POA: Diagnosis not present

## 2022-02-10 DIAGNOSIS — G8194 Hemiplegia, unspecified affecting left nondominant side: Secondary | ICD-10-CM

## 2022-02-10 DIAGNOSIS — Z8679 Personal history of other diseases of the circulatory system: Secondary | ICD-10-CM

## 2022-02-10 MED ORDER — IOPAMIDOL (ISOVUE-370) INJECTION 76%
75.0000 mL | Freq: Once | INTRAVENOUS | Status: AC | PRN
  Administered 2022-02-10: 75 mL via INTRAVENOUS

## 2022-02-10 NOTE — Telephone Encounter (Signed)
I received a message from the answering service about his CT scan and spoke with the radiologist. ? ?He has a very abnormal head CT scan.  It does show the aneurysm clipping that was known about.  Additionally he has sequela from surgery for a distant spinal cord tumor.   ? ?Most concerning is multiple apparent extra-axial tumors with significant mass effect and mild shift.  The most likely, he has multiple meningiomas.  The largest is in the right parietal region and there are also large left inferior frontal/sphenoid wing, left posterior temporal, 2 falcine. ? ?I spoke with Mr. Knoth.  He reports that his symptoms have developed over many months.  He had a fall recently with a lot of bruising and feels that his gait worsened from the fall.  He recently saw Dr. Krista Blue prompting the study. ? ?I discussed the findings with him and my concern due to the mass effect.  He stated he would prefer to see neurosurgery as an outpatient rather than go to the emergency room.  I let him know I would contact neurosurgery but that if he has any worsening he should go straight to the emergency room. ? ?I then spoke to [place] [facility] and left a message to be called by the neurosurgeon on-call. ?

## 2022-02-11 NOTE — Telephone Encounter (Signed)
I spoke with neurosurgery 02/10/2022 and they will contact the patient to get him worked into the schedule next week ?

## 2022-02-13 ENCOUNTER — Ambulatory Visit: Payer: Medicare HMO

## 2022-02-14 DIAGNOSIS — D329 Benign neoplasm of meninges, unspecified: Secondary | ICD-10-CM | POA: Diagnosis not present

## 2022-02-14 DIAGNOSIS — Z6832 Body mass index (BMI) 32.0-32.9, adult: Secondary | ICD-10-CM | POA: Diagnosis not present

## 2022-02-15 ENCOUNTER — Ambulatory Visit: Payer: Medicare HMO | Admitting: Physical Therapy

## 2022-02-15 ENCOUNTER — Other Ambulatory Visit: Payer: Self-pay | Admitting: Neurosurgery

## 2022-02-20 ENCOUNTER — Ambulatory Visit: Payer: Medicare HMO

## 2022-02-21 NOTE — Pre-Procedure Instructions (Signed)
Surgical Instructions ? ? ? Your procedure is scheduled on Monday, May 8th. ? Report to [facility] Main Entrance "A" at 06:00 A.M., then check in with the Admitting office. ? Call this number if you have problems the morning of surgery: ? [phone] ? ? If you have any questions prior to your surgery date call [phone]: Open Monday-Friday 8am-4pm ? ? ? Remember: ? Do not eat or drink after midnight the night before your surgery ? ?  ? Take these medicines the morning of surgery with A SIP OF WATER  ?dexamethasone (DECADRON)  ?escitalopram (LEXAPRO)  ?gabapentin (NEURONTIN)  ?lamoTRIgine (LAMICTAL)  ?rosuvastatin (CRESTOR) ? ?If needed: ?chlorpheniramine (CHLOR-TRIMETON) ?naphazoline-pheniramine (VISINE) eye drops ?polyvinyl alcohol (ARTIFICIAL TEARS) eye drops ? ?As of today, STOP taking any Aspirin (unless otherwise instructed by your surgeon) Aleve, Naproxen, Ibuprofen, Motrin, Advil, Goody's, BC's, all herbal medications, fish oil, and all vitamins. ?         ?           ?Do NOT Smoke (Tobacco/Vaping) for 24 hours prior to your procedure. ? ?If you use a CPAP at night, you may bring your mask/headgear for your overnight stay. ?  ?Contacts, glasses, piercing's, hearing aid's, dentures or partials may not be worn into surgery, please bring cases for these belongings.  ?  ?For patients admitted to the hospital, discharge time will be determined by your treatment team. ?  ?Patients discharged the day of surgery will not be allowed to drive home, and someone needs to stay with them for 24 hours. ? ?SURGICAL WAITING ROOM VISITATION ?Patients having surgery or a procedure may have two support people in the waiting room. These visitors may be switched out with other visitors if needed. ?Children under the age of 76 must have an adult accompany them who is not the patient. ?If the patient needs to stay at the hospital during part of their recovery, the visitor guidelines for inpatient rooms apply. ? ?Please refer to  the [facility] website for the visitor guidelines for Inpatients (after your surgery is over and you are in a regular room).  ? ? ?Special instructions:   ?- Preparing For Surgery ? ?Before surgery, you can play an important role. Because skin is not sterile, your skin needs to be as free of germs as possible. You can reduce the number of germs on your skin by washing with CHG (chlorahexidine gluconate) Soap before surgery.  CHG is an antiseptic cleaner which kills germs and bonds with the skin to continue killing germs even after washing.   ? ?Oral Hygiene is also important to reduce your risk of infection.  Remember - BRUSH YOUR TEETH THE MORNING OF SURGERY WITH YOUR REGULAR TOOTHPASTE ? ?Please do not use if you have an allergy to CHG or antibacterial soaps. If your skin becomes reddened/irritated stop using the CHG.  ?Do not shave (including legs and underarms) for at least 48 hours prior to first CHG shower. It is OK to shave your face. ? ?Please follow these instructions carefully. ?  ?Shower the NIGHT BEFORE SURGERY and the MORNING OF SURGERY ? ?If you chose to wash your hair, wash your hair first as usual with your normal shampoo. ? ?After you shampoo, rinse your hair and body thoroughly to remove the shampoo. ? ?Use CHG Soap as you would any other liquid soap. You can apply CHG directly to the skin and wash gently with a scrungie or a clean washcloth.  ? ?Apply the CHG  Soap to your body ONLY FROM THE NECK DOWN.  Do not use on open wounds or open sores. Avoid contact with your eyes, ears, mouth and genitals (private parts). Wash Face and genitals (private parts)  with your normal soap.  ? ?Wash thoroughly, paying special attention to the area where your surgery will be performed. ? ?Thoroughly rinse your body with warm water from the neck down. ? ?DO NOT shower/wash with your normal soap after using and rinsing off the CHG Soap. ? ?Pat yourself dry with a CLEAN TOWEL. ? ?Wear CLEAN PAJAMAS to bed  the night before surgery ? ?Place CLEAN SHEETS on your bed the night before your surgery ? ?DO NOT SLEEP WITH PETS. ? ? ?Day of Surgery: ?Take a shower with CHG soap. ?Do not wear jewelry  ?Do not wear lotions, powders, colognes, or deodorant. ?Do not shave 48 hours prior to surgery.  Men may shave face and neck. ?Do not bring valuables to the hospital.  ?[facility] is not responsible for any belongings or valuables. ? ?Wear Clean/Comfortable clothing the morning of surgery ?Remember to brush your teeth WITH YOUR REGULAR TOOTHPASTE. ?  ?Please read over the following fact sheets that you were given. ? ? ? ?If you received a COVID test during your pre-op visit  it is requested that you wear a mask when out in public, stay away from anyone that may not be feeling well and notify your surgeon if you develop symptoms. If you have been in contact with anyone that has tested positive in the last 10 days please notify you surgeon.  ?

## 2022-02-22 ENCOUNTER — Encounter (HOSPITAL_COMMUNITY): Payer: Self-pay

## 2022-02-22 ENCOUNTER — Ambulatory Visit: Payer: Medicare HMO

## 2022-02-22 ENCOUNTER — Other Ambulatory Visit: Payer: Self-pay

## 2022-02-22 ENCOUNTER — Encounter (HOSPITAL_COMMUNITY)
Admission: RE | Admit: 2022-02-22 | Discharge: 2022-02-22 | Disposition: A | Discharge status: Home / Self Care | Payer: Medicare HMO | Source: Ambulatory Visit | Attending: Neurosurgery | Admitting: Neurosurgery

## 2022-02-22 VITALS — BP 140/81 | HR 84 | Temp 98.3°F | Resp 18 | Ht 72.0 in | Wt 219.0 lb

## 2022-02-22 DIAGNOSIS — E119 Type 2 diabetes mellitus without complications: Secondary | ICD-10-CM | POA: Diagnosis not present

## 2022-02-22 DIAGNOSIS — Z01818 Encounter for other preprocedural examination: Secondary | ICD-10-CM | POA: Insufficient documentation

## 2022-02-22 LAB — BASIC METABOLIC PANEL
Anion gap: 9 (ref 5–15)
BUN: 27 mg/dL — ABNORMAL HIGH (ref 8–23)
CO2: 24 mmol/L (ref 22–32)
Calcium: 9.9 mg/dL (ref 8.9–10.3)
Chloride: 104 mmol/L (ref 98–111)
Creatinine, Ser: 0.92 mg/dL (ref 0.61–1.24)
GFR, Estimated: >60 mL/min (ref >60)
Glucose, Bld: 178 mg/dL — ABNORMAL HIGH (ref 70–99)
Potassium: 4.2 mmol/L (ref 3.5–5.1)
Sodium: 137 mmol/L (ref 135–145)

## 2022-02-22 LAB — HEMOGLOBIN A1C
Hgb A1c MFr Bld: 5.7 % — ABNORMAL HIGH (ref 4.8–5.6)
Mean Plasma Glucose: 116.89 mg/dL

## 2022-02-22 LAB — CBC
HCT: 46.9 % (ref 39.0–52.0)
Hemoglobin: 15.4 g/dL (ref 13.0–17.0)
MCH: 30.2 pg (ref 26.0–34.0)
MCHC: 32.8 g/dL (ref 30.0–36.0)
MCV: 92 fL (ref 80.0–100.0)
Platelets: 330 10*3/uL (ref 150–400)
RBC: 5.1 MIL/uL (ref 4.22–5.81)
RDW: 13.4 % (ref 11.5–15.5)
WBC: 10.7 10*3/uL — ABNORMAL HIGH (ref 4.0–10.5)
nRBC: 0 % (ref 0.0–0.2)

## 2022-02-22 LAB — GLUCOSE, CAPILLARY: Glucose-Capillary: 188 mg/dL — ABNORMAL HIGH (ref 70–99)

## 2022-02-22 NOTE — Progress Notes (Signed)
PCP - Lorrene Reid, PA-C ?Cardiologist - denies ? ?PPM/ICD - denies ? ? ?Chest x-ray - pt says he has had one but unsure when, he states no abnormalities ?EKG - 02/22/22 at PAT ?Stress Test - denies ?ECHO - denies ?Cardiac Cath - denies ? ?Sleep Study - denies ? ?DM- Type 2 ?Pt does not check CBG at home ? ?ASA/Blood Thinner Instructions: n/a ? ? ?ERAS Protcol - no, NPO ? ? ?COVID TEST- n/a ? ? ?Anesthesia review: no ? ?Patient denies shortness of breath, fever, cough and chest pain at PAT appointment ? ? ?All instructions explained to the patient, with a verbal understanding of the material. Patient agrees to go over the instructions while at home for a better understanding. Patient also instructed to notify surgeon of any contact with COVID+ person or if he develops any symptoms. The opportunity to ask questions was provided. ?  ?

## 2022-02-26 NOTE — Anesthesia Preprocedure Evaluation (Addendum)
Anesthesia Evaluation  ?Patient identified by MRN, date of birth, ID band ?Patient awake ? ? ? ?Reviewed: ?Allergy & Precautions, NPO status , Patient's Chart, lab work & pertinent test results ? ?History of Anesthesia Complications ?Negative for: history of anesthetic complications ? ?Airway ?Mallampati: I ? ?TM Distance: >3 FB ?Neck ROM: Full ? ? ? Dental ? ?(+) Dental Advisory Given ?  ?Pulmonary ?former smoker,  ?  ?breath sounds clear to auscultation ? ? ? ? ? ? Cardiovascular ?negative cardio ROS ? ? ?Rhythm:Regular Rate:Normal ? ? ?  ?Neuro/Psych ?Depression H/o cerebral aneurysm repair ?  ? GI/Hepatic ?Neg liver ROS, GERD  Controlled and Medicated,  ?Endo/Other  ?diabetes (glu 142) ? Renal/GU ?negative Renal ROS  ? ?  ?Musculoskeletal ? ? Abdominal ?  ?Peds ? Hematology ?negative hematology ROS ?(+)   ?Anesthesia Other Findings ? ? Reproductive/Obstetrics ? ?  ? ? ? ? ? ? ? ? ? ? ? ? ? ?  ?  ? ? ? ? ? ? ? ?Anesthesia Physical ?Anesthesia Plan ? ?ASA: 3 ? ?Anesthesia Plan: General  ? ?Post-op Pain Management: Tylenol PO (pre-op)*  ? ?Induction: Intravenous ? ?PONV Risk Score and Plan: 2 and Ondansetron and Dexamethasone ? ?Airway Management Planned: Oral ETT ? ?Additional Equipment: Arterial line ? ?Intra-op Plan:  ? ?Post-operative Plan: Possible Post-op intubation/ventilation ? ?Informed Consent: I have reviewed the patients History and Physical, chart, labs and discussed the procedure including the risks, benefits and alternatives for the proposed anesthesia with the patient or authorized representative who has indicated his/her understanding and acceptance.  ? ? ? ? ? ?Plan Discussed with: CRNA and Surgeon ? ?Anesthesia Plan Comments:   ? ? ? ? ? ?Anesthesia Quick Evaluation ? ?

## 2022-02-27 ENCOUNTER — Inpatient Hospital Stay (HOSPITAL_COMMUNITY)
Admission: RE | Admit: 2022-02-27 | Discharge: 2022-03-07 | DRG: 025 | Disposition: A | Discharge status: Other Acute Inpatient Hospital | Payer: Medicare HMO | Attending: Neurosurgery | Admitting: Neurosurgery

## 2022-02-27 ENCOUNTER — Inpatient Hospital Stay (HOSPITAL_COMMUNITY): Payer: Medicare HMO | Admitting: Anesthesiology

## 2022-02-27 ENCOUNTER — Inpatient Hospital Stay (HOSPITAL_COMMUNITY)
Admission: RE | Disposition: A | Discharge status: Other Acute Inpatient Hospital | Payer: Self-pay | Source: Home / Self Care | Attending: Neurosurgery

## 2022-02-27 ENCOUNTER — Other Ambulatory Visit: Payer: Self-pay

## 2022-02-27 ENCOUNTER — Encounter (HOSPITAL_COMMUNITY): Payer: Self-pay | Admitting: Neurosurgery

## 2022-02-27 DIAGNOSIS — R269 Unspecified abnormalities of gait and mobility: Secondary | ICD-10-CM | POA: Diagnosis present

## 2022-02-27 DIAGNOSIS — Z87891 Personal history of nicotine dependence: Secondary | ICD-10-CM

## 2022-02-27 DIAGNOSIS — D329 Benign neoplasm of meninges, unspecified: Secondary | ICD-10-CM | POA: Diagnosis present

## 2022-02-27 DIAGNOSIS — Y92239 Unspecified place in hospital as the place of occurrence of the external cause: Secondary | ICD-10-CM | POA: Diagnosis not present

## 2022-02-27 DIAGNOSIS — R252 Cramp and spasm: Secondary | ICD-10-CM | POA: Diagnosis not present

## 2022-02-27 DIAGNOSIS — E119 Type 2 diabetes mellitus without complications: Secondary | ICD-10-CM | POA: Diagnosis not present

## 2022-02-27 DIAGNOSIS — Z833 Family history of diabetes mellitus: Secondary | ICD-10-CM

## 2022-02-27 DIAGNOSIS — G9389 Other specified disorders of brain: Secondary | ICD-10-CM | POA: Diagnosis not present

## 2022-02-27 DIAGNOSIS — Z9889 Other specified postprocedural states: Secondary | ICD-10-CM | POA: Diagnosis not present

## 2022-02-27 DIAGNOSIS — D62 Acute posthemorrhagic anemia: Secondary | ICD-10-CM | POA: Diagnosis not present

## 2022-02-27 DIAGNOSIS — D496 Neoplasm of unspecified behavior of brain: Principal | ICD-10-CM | POA: Diagnosis present

## 2022-02-27 DIAGNOSIS — K5900 Constipation, unspecified: Secondary | ICD-10-CM | POA: Diagnosis not present

## 2022-02-27 DIAGNOSIS — G8194 Hemiplegia, unspecified affecting left nondominant side: Secondary | ICD-10-CM | POA: Diagnosis not present

## 2022-02-27 DIAGNOSIS — K219 Gastro-esophageal reflux disease without esophagitis: Secondary | ICD-10-CM | POA: Diagnosis present

## 2022-02-27 DIAGNOSIS — R531 Weakness: Secondary | ICD-10-CM | POA: Diagnosis not present

## 2022-02-27 DIAGNOSIS — F32A Depression, unspecified: Secondary | ICD-10-CM | POA: Diagnosis not present

## 2022-02-27 DIAGNOSIS — K5901 Slow transit constipation: Secondary | ICD-10-CM | POA: Diagnosis not present

## 2022-02-27 DIAGNOSIS — I671 Cerebral aneurysm, nonruptured: Secondary | ICD-10-CM | POA: Diagnosis not present

## 2022-02-27 DIAGNOSIS — Y838 Other surgical procedures as the cause of abnormal reaction of the patient, or of later complication, without mention of misadventure at the time of the procedure: Secondary | ICD-10-CM | POA: Diagnosis not present

## 2022-02-27 DIAGNOSIS — D320 Benign neoplasm of cerebral meninges: Secondary | ICD-10-CM | POA: Diagnosis not present

## 2022-02-27 DIAGNOSIS — R609 Edema, unspecified: Secondary | ICD-10-CM | POA: Diagnosis not present

## 2022-02-27 DIAGNOSIS — Y758 Miscellaneous neurological devices associated with adverse incidents, not elsewhere classified: Secondary | ICD-10-CM | POA: Diagnosis not present

## 2022-02-27 DIAGNOSIS — Z86011 Personal history of benign neoplasm of the brain: Secondary | ICD-10-CM

## 2022-02-27 DIAGNOSIS — G8114 Spastic hemiplegia affecting left nondominant side: Secondary | ICD-10-CM | POA: Diagnosis not present

## 2022-02-27 DIAGNOSIS — Z79899 Other long term (current) drug therapy: Secondary | ICD-10-CM

## 2022-02-27 DIAGNOSIS — Z86718 Personal history of other venous thrombosis and embolism: Secondary | ICD-10-CM | POA: Diagnosis not present

## 2022-02-27 DIAGNOSIS — E669 Obesity, unspecified: Secondary | ICD-10-CM | POA: Diagnosis not present

## 2022-02-27 DIAGNOSIS — G936 Cerebral edema: Secondary | ICD-10-CM | POA: Diagnosis present

## 2022-02-27 DIAGNOSIS — M25562 Pain in left knee: Secondary | ICD-10-CM | POA: Diagnosis not present

## 2022-02-27 DIAGNOSIS — I10 Essential (primary) hypertension: Secondary | ICD-10-CM | POA: Diagnosis not present

## 2022-02-27 DIAGNOSIS — I69844 Monoplegia of lower limb following other cerebrovascular disease affecting left non-dominant side: Secondary | ICD-10-CM | POA: Diagnosis not present

## 2022-02-27 DIAGNOSIS — D72828 Other elevated white blood cell count: Secondary | ICD-10-CM | POA: Diagnosis not present

## 2022-02-27 DIAGNOSIS — M792 Neuralgia and neuritis, unspecified: Secondary | ICD-10-CM | POA: Diagnosis not present

## 2022-02-27 DIAGNOSIS — I82442 Acute embolism and thrombosis of left tibial vein: Secondary | ICD-10-CM | POA: Diagnosis not present

## 2022-02-27 DIAGNOSIS — E1165 Type 2 diabetes mellitus with hyperglycemia: Secondary | ICD-10-CM | POA: Diagnosis not present

## 2022-02-27 DIAGNOSIS — R220 Localized swelling, mass and lump, head: Secondary | ICD-10-CM | POA: Diagnosis not present

## 2022-02-27 DIAGNOSIS — I82409 Acute embolism and thrombosis of unspecified deep veins of unspecified lower extremity: Secondary | ICD-10-CM | POA: Diagnosis not present

## 2022-02-27 DIAGNOSIS — E1169 Type 2 diabetes mellitus with other specified complication: Secondary | ICD-10-CM | POA: Diagnosis not present

## 2022-02-27 HISTORY — PX: CRANIOTOMY: SHX93

## 2022-02-27 LAB — POCT I-STAT 7, (LYTES, BLD GAS, ICA,H+H)
Acid-base deficit: 2 mmol/L (ref 0.0–2.0)
Acid-base deficit: 1 mmol/L (ref 0.0–2.0)
Acid-base deficit: 2 mmol/L (ref 0.0–2.0)
Bicarbonate: 24 mmol/L (ref 20.0–28.0)
Bicarbonate: 23 mmol/L (ref 20.0–28.0)
Bicarbonate: 24.3 mmol/L (ref 20.0–28.0)
Calcium, Ion: 1.17 mmol/L (ref 1.15–1.40)
Calcium, Ion: 1.15 mmol/L (ref 1.15–1.40)
Calcium, Ion: 1.18 mmol/L (ref 1.15–1.40)
HCT: 38 % — ABNORMAL LOW (ref 39.0–52.0)
HCT: 33 % — ABNORMAL LOW (ref 39.0–52.0)
HCT: 37 % — ABNORMAL LOW (ref 39.0–52.0)
Hemoglobin: 12.9 g/dL — ABNORMAL LOW (ref 13.0–17.0)
Hemoglobin: 11.2 g/dL — ABNORMAL LOW (ref 13.0–17.0)
Hemoglobin: 12.6 g/dL — ABNORMAL LOW (ref 13.0–17.0)
O2 Saturation: 100 %
O2 Saturation: 100 %
O2 Saturation: 100 %
Patient temperature: 36
Patient temperature: 35.9
Patient temperature: 36
Potassium: 4.4 mmol/L (ref 3.5–5.1)
Potassium: 4.6 mmol/L (ref 3.5–5.1)
Potassium: 4.8 mmol/L (ref 3.5–5.1)
Sodium: 131 mmol/L — ABNORMAL LOW (ref 135–145)
Sodium: 132 mmol/L — ABNORMAL LOW (ref 135–145)
Sodium: 137 mmol/L (ref 135–145)
TCO2: 25 mmol/L (ref 22–32)
TCO2: 24 mmol/L (ref 22–32)
TCO2: 26 mmol/L (ref 22–32)
pCO2 arterial: 41 mmHg (ref 32–48)
pCO2 arterial: 39.4 mmHg (ref 32–48)
pCO2 arterial: 39.6 mmHg (ref 32–48)
pH, Arterial: 7.37 (ref 7.35–7.45)
pH, Arterial: 7.37 (ref 7.35–7.45)
pH, Arterial: 7.391 (ref 7.35–7.45)
pO2, Arterial: 195 mmHg — ABNORMAL HIGH (ref 83–108)
pO2, Arterial: 219 mmHg — ABNORMAL HIGH (ref 83–108)
pO2, Arterial: 219 mmHg — ABNORMAL HIGH (ref 83–108)

## 2022-02-27 LAB — CBC
HCT: 34.6 % — ABNORMAL LOW (ref 39.0–52.0)
Hemoglobin: 11.6 g/dL — ABNORMAL LOW (ref 13.0–17.0)
MCH: 31.6 pg (ref 26.0–34.0)
MCHC: 33.5 g/dL (ref 30.0–36.0)
MCV: 94.3 fL (ref 80.0–100.0)
Platelets: 241 10*3/uL (ref 150–400)
RBC: 3.67 MIL/uL — ABNORMAL LOW (ref 4.22–5.81)
RDW: 13.5 % (ref 11.5–15.5)
WBC: 8.2 10*3/uL (ref 4.0–10.5)
nRBC: 0 % (ref 0.0–0.2)

## 2022-02-27 LAB — POCT I-STAT, CHEM 8
BUN: 23 mg/dL (ref 8–23)
Calcium, Ion: 1.19 mmol/L (ref 1.15–1.40)
Chloride: 99 mmol/L (ref 98–111)
Creatinine, Ser: 0.7 mg/dL (ref 0.61–1.24)
Glucose, Bld: 121 mg/dL — ABNORMAL HIGH (ref 70–99)
HCT: 37 % — ABNORMAL LOW (ref 39.0–52.0)
Hemoglobin: 12.6 g/dL — ABNORMAL LOW (ref 13.0–17.0)
Potassium: 4.5 mmol/L (ref 3.5–5.1)
Sodium: 131 mmol/L — ABNORMAL LOW (ref 135–145)
TCO2: 24 mmol/L (ref 22–32)

## 2022-02-27 LAB — GLUCOSE, CAPILLARY
Glucose-Capillary: 142 mg/dL — ABNORMAL HIGH (ref 70–99)
Glucose-Capillary: 151 mg/dL — ABNORMAL HIGH (ref 70–99)

## 2022-02-27 LAB — BASIC METABOLIC PANEL
Anion gap: 7 (ref 5–15)
BUN: 19 mg/dL (ref 8–23)
CO2: 21 mmol/L — ABNORMAL LOW (ref 22–32)
Calcium: 7.6 mg/dL — ABNORMAL LOW (ref 8.9–10.3)
Chloride: 110 mmol/L (ref 98–111)
Creatinine, Ser: 0.84 mg/dL (ref 0.61–1.24)
GFR, Estimated: >60 mL/min (ref >60)
Glucose, Bld: 165 mg/dL — ABNORMAL HIGH (ref 70–99)
Potassium: 4.7 mmol/L (ref 3.5–5.1)
Sodium: 138 mmol/L (ref 135–145)

## 2022-02-27 LAB — PREPARE RBC (CROSSMATCH)

## 2022-02-27 LAB — ABO/RH: ABO/RH(D): B POS

## 2022-02-27 LAB — MRSA NEXT GEN BY PCR, NASAL: MRSA by PCR Next Gen: NOT DETECTED

## 2022-02-27 SURGERY — CRANIOTOMY TUMOR EXCISION
Anesthesia: General | Site: Head | Laterality: Bilateral

## 2022-02-27 MED ORDER — ALBUMIN HUMAN 5 % IV SOLN: INTRAVENOUS | Status: DC | PRN

## 2022-02-27 MED ORDER — NALOXONE HCL 0.4 MG/ML IJ SOLN: 0.0800 mg | INTRAMUSCULAR | Status: DC | PRN

## 2022-02-27 MED ORDER — LABETALOL HCL 5 MG/ML IV SOLN: 10.0000 mg | INTRAVENOUS | Status: DC | PRN

## 2022-02-27 MED ORDER — VITAMIN D (ERGOCALCIFEROL) 1.25 MG (50000 UNIT) PO CAPS
50000.0000 [IU] | ORAL_CAPSULE | ORAL | Status: DC
  Administered 2022-03-05: 50000 [IU] via ORAL
  Filled 2022-02-27: qty 1

## 2022-02-27 MED ORDER — ACETAMINOPHEN 325 MG PO TABS: 650.0000 mg | ORAL_TABLET | ORAL | Status: DC | PRN

## 2022-02-27 MED ORDER — THROMBIN 5000 UNITS EX SOLR
CUTANEOUS | Status: AC
  Filled 2022-02-27: qty 5000

## 2022-02-27 MED ORDER — LIDOCAINE-EPINEPHRINE 1 %-1:100000 IJ SOLN
INTRAMUSCULAR | Status: AC
  Filled 2022-02-27: qty 1

## 2022-02-27 MED ORDER — HYDROCODONE-ACETAMINOPHEN 5-325 MG PO TABS
1.0000 | ORAL_TABLET | ORAL | Status: DC | PRN
  Administered 2022-02-27 – 2022-03-07 (×10): 1 via ORAL
  Filled 2022-02-27 (×11): qty 1

## 2022-02-27 MED ORDER — PROPOFOL 10 MG/ML IV BOLUS
INTRAVENOUS | Status: DC | PRN
  Administered 2022-02-27 (×3): 50 mg via INTRAVENOUS

## 2022-02-27 MED ORDER — PROPOFOL 10 MG/ML IV BOLUS
INTRAVENOUS | Status: AC
  Filled 2022-02-27: qty 20

## 2022-02-27 MED ORDER — CHLORHEXIDINE GLUCONATE 0.12 % MT SOLN: 15.0000 mL | Freq: Once | OROMUCOSAL | Status: AC

## 2022-02-27 MED ORDER — DEXAMETHASONE SODIUM PHOSPHATE 10 MG/ML IJ SOLN
INTRAMUSCULAR | Status: AC
  Filled 2022-02-27: qty 1

## 2022-02-27 MED ORDER — ESCITALOPRAM OXALATE 10 MG PO TABS
20.0000 mg | ORAL_TABLET | Freq: Every day | ORAL | Status: DC
Start: 2022-02-27 — End: 2022-03-07
  Administered 2022-02-27 – 2022-03-06 (×8): 20 mg via ORAL
  Filled 2022-02-27 (×8): qty 2

## 2022-02-27 MED ORDER — SODIUM CHLORIDE 0.9 % IV SOLN: INTRAVENOUS | Status: DC

## 2022-02-27 MED ORDER — BACITRACIN ZINC 500 UNIT/GM EX OINT
TOPICAL_OINTMENT | CUTANEOUS | Status: AC
  Filled 2022-02-27: qty 28.35

## 2022-02-27 MED ORDER — THROMBIN 20000 UNITS EX SOLR
CUTANEOUS | Status: AC
  Filled 2022-02-27: qty 20000

## 2022-02-27 MED ORDER — GABAPENTIN 100 MG PO CAPS
100.0000 mg | ORAL_CAPSULE | Freq: Two times a day (BID) | ORAL | Status: DC
  Administered 2022-02-27 – 2022-03-07 (×16): 100 mg via ORAL
  Filled 2022-02-27 (×16): qty 1

## 2022-02-27 MED ORDER — DEXAMETHASONE SODIUM PHOSPHATE 10 MG/ML IJ SOLN
INTRAMUSCULAR | Status: DC | PRN
  Administered 2022-02-27: 10 mg via INTRAVENOUS

## 2022-02-27 MED ORDER — THROMBIN 5000 UNITS EX SOLR
CUTANEOUS | Status: AC
  Filled 2022-02-27: qty 10000

## 2022-02-27 MED ORDER — SODIUM CHLORIDE 0.9 % IV SOLN: INTRAVENOUS | Status: DC | PRN

## 2022-02-27 MED ORDER — SUGAMMADEX SODIUM 200 MG/2ML IV SOLN
INTRAVENOUS | Status: DC | PRN
Start: 2022-02-27 — End: 2022-02-27
  Administered 2022-02-27: 200 mg via INTRAVENOUS

## 2022-02-27 MED ORDER — OXYCODONE HCL 5 MG PO TABS: 5.0000 mg | ORAL_TABLET | Freq: Once | ORAL | Status: DC | PRN

## 2022-02-27 MED ORDER — ONDANSETRON HCL 4 MG/2ML IJ SOLN
INTRAMUSCULAR | Status: DC | PRN
  Administered 2022-02-27: 4 mg via INTRAVENOUS

## 2022-02-27 MED ORDER — PHENYLEPHRINE 80 MCG/ML (10ML) SYRINGE FOR IV PUSH (FOR BLOOD PRESSURE SUPPORT)
PREFILLED_SYRINGE | INTRAVENOUS | Status: DC | PRN
  Administered 2022-02-27: 80 ug via INTRAVENOUS

## 2022-02-27 MED ORDER — ONDANSETRON HCL 4 MG/2ML IJ SOLN
INTRAMUSCULAR | Status: AC
  Filled 2022-02-27: qty 2

## 2022-02-27 MED ORDER — CHLORHEXIDINE GLUCONATE CLOTH 2 % EX PADS
6.0000 | MEDICATED_PAD | Freq: Once | CUTANEOUS | Status: DC
  Administered 2022-02-27: 6 via TOPICAL

## 2022-02-27 MED ORDER — SODIUM CHLORIDE 0.9 % IV SOLN
0.1500 ug/kg/min | INTRAVENOUS | Status: AC
  Administered 2022-02-27: .2 ug/kg/min via INTRAVENOUS
  Filled 2022-02-27: qty 5000

## 2022-02-27 MED ORDER — OXYCODONE HCL 5 MG/5ML PO SOLN: 5.0000 mg | Freq: Once | ORAL | Status: DC | PRN

## 2022-02-27 MED ORDER — ORAL CARE MOUTH RINSE: 15.0000 mL | Freq: Once | OROMUCOSAL | Status: AC

## 2022-02-27 MED ORDER — ROCURONIUM BROMIDE 10 MG/ML (PF) SYRINGE
PREFILLED_SYRINGE | INTRAVENOUS | Status: AC
  Filled 2022-02-27: qty 10

## 2022-02-27 MED ORDER — SODIUM CHLORIDE 0.9 % IR SOLN
Status: DC | PRN
Start: 2022-02-27 — End: 2022-02-27

## 2022-02-27 MED ORDER — PHENYLEPHRINE HCL-NACL 20-0.9 MG/250ML-% IV SOLN
INTRAVENOUS | Status: DC | PRN
  Administered 2022-02-27: 30 ug/min via INTRAVENOUS

## 2022-02-27 MED ORDER — BACITRACIN ZINC 500 UNIT/GM EX OINT
TOPICAL_OINTMENT | CUTANEOUS | Status: DC | PRN
  Administered 2022-02-27: 1 via TOPICAL

## 2022-02-27 MED ORDER — CEFAZOLIN SODIUM-DEXTROSE 2-4 GM/100ML-% IV SOLN
INTRAVENOUS | Status: AC
  Filled 2022-02-27: qty 100

## 2022-02-27 MED ORDER — POLYVINYL ALCOHOL 1.4 % OP SOLN
1.0000 [drp] | OPHTHALMIC | Status: DC | PRN
  Administered 2022-03-01: 1 [drp] via OPHTHALMIC
  Filled 2022-02-27: qty 15

## 2022-02-27 MED ORDER — ACETAMINOPHEN 500 MG PO TABS: 1000.0000 mg | ORAL_TABLET | Freq: Once | ORAL | Status: AC

## 2022-02-27 MED ORDER — LEVETIRACETAM IN NACL 500 MG/100ML IV SOLN
500.0000 mg | Freq: Two times a day (BID) | INTRAVENOUS | Status: DC
  Administered 2022-02-27 – 2022-03-01 (×4): 500 mg via INTRAVENOUS
  Filled 2022-02-27 (×4): qty 100

## 2022-02-27 MED ORDER — ACETAMINOPHEN 650 MG RE SUPP
650.0000 mg | RECTAL | Status: DC | PRN
Start: 2022-02-27 — End: 2022-03-07

## 2022-02-27 MED ORDER — FENTANYL CITRATE (PF) 100 MCG/2ML IJ SOLN: 25.0000 ug | INTRAMUSCULAR | Status: DC | PRN

## 2022-02-27 MED ORDER — ROCURONIUM BROMIDE 10 MG/ML (PF) SYRINGE
PREFILLED_SYRINGE | INTRAVENOUS | Status: DC | PRN
  Administered 2022-02-27: 70 mg via INTRAVENOUS
  Administered 2022-02-27: 30 mg via INTRAVENOUS
  Administered 2022-02-27: 40 mg via INTRAVENOUS
  Administered 2022-02-27: 30 mg via INTRAVENOUS

## 2022-02-27 MED ORDER — HEMOSTATIC AGENTS (NO CHARGE) OPTIME
TOPICAL | Status: DC | PRN
  Administered 2022-02-27: 1 via TOPICAL

## 2022-02-27 MED ORDER — CHLORHEXIDINE GLUCONATE 0.12 % MT SOLN
OROMUCOSAL | Status: AC
Start: 2022-02-27 — End: 2022-02-27
  Administered 2022-02-27: 15 mL via OROMUCOSAL
  Filled 2022-02-27: qty 15

## 2022-02-27 MED ORDER — MIDAZOLAM HCL 2 MG/2ML IJ SOLN: 0.5000 mg | Freq: Once | INTRAMUSCULAR | Status: DC | PRN

## 2022-02-27 MED ORDER — FENTANYL CITRATE (PF) 250 MCG/5ML IJ SOLN
INTRAMUSCULAR | Status: DC | PRN
  Administered 2022-02-27: 250 ug via INTRAVENOUS

## 2022-02-27 MED ORDER — NAPHAZOLINE-PHENIRAMINE 0.025-0.3 % OP SOLN
1.0000 [drp] | Freq: Four times a day (QID) | OPHTHALMIC | Status: DC | PRN
  Filled 2022-02-27: qty 15

## 2022-02-27 MED ORDER — ONDANSETRON HCL 4 MG PO TABS: 4.0000 mg | ORAL_TABLET | ORAL | Status: DC | PRN

## 2022-02-27 MED ORDER — DEXAMETHASONE SODIUM PHOSPHATE 10 MG/ML IJ SOLN
6.0000 mg | Freq: Four times a day (QID) | INTRAMUSCULAR | Status: AC
  Administered 2022-02-27 – 2022-02-28 (×4): 6 mg via INTRAVENOUS
  Filled 2022-02-27 (×4): qty 1

## 2022-02-27 MED ORDER — CHLORHEXIDINE GLUCONATE CLOTH 2 % EX PADS
6.0000 | MEDICATED_PAD | Freq: Every day | CUTANEOUS | Status: DC
  Administered 2022-02-28 – 2022-03-07 (×8): 6 via TOPICAL

## 2022-02-27 MED ORDER — CEFAZOLIN SODIUM-DEXTROSE 2-4 GM/100ML-% IV SOLN
2.0000 g | INTRAVENOUS | Status: AC
  Administered 2022-02-27: 2 g via INTRAVENOUS

## 2022-02-27 MED ORDER — PROMETHAZINE HCL 25 MG PO TABS
12.5000 mg | ORAL_TABLET | ORAL | Status: DC | PRN
  Administered 2022-03-06 (×2): 25 mg via ORAL
  Filled 2022-02-27 (×2): qty 1

## 2022-02-27 MED ORDER — THROMBIN 5000 UNITS EX SOLR
CUTANEOUS | Status: DC | PRN
  Administered 2022-02-27 (×8): 5 mL via TOPICAL

## 2022-02-27 MED ORDER — PHENYLEPHRINE 80 MCG/ML (10ML) SYRINGE FOR IV PUSH (FOR BLOOD PRESSURE SUPPORT)
PREFILLED_SYRINGE | INTRAVENOUS | Status: AC
  Filled 2022-02-27: qty 10

## 2022-02-27 MED ORDER — LAMOTRIGINE 100 MG PO TABS
100.0000 mg | ORAL_TABLET | Freq: Two times a day (BID) | ORAL | Status: DC
  Administered 2022-02-27 – 2022-03-07 (×16): 100 mg via ORAL
  Filled 2022-02-27 (×16): qty 1

## 2022-02-27 MED ORDER — SODIUM CHLORIDE 0.9% IV SOLUTION: Freq: Once | INTRAVENOUS | Status: DC

## 2022-02-27 MED ORDER — ACETAMINOPHEN 500 MG PO TABS
ORAL_TABLET | ORAL | Status: AC
  Administered 2022-02-27: 1000 mg via ORAL
  Filled 2022-02-27: qty 2

## 2022-02-27 MED ORDER — SODIUM CHLORIDE 0.9 % IV SOLN
INTRAVENOUS | Status: DC | PRN
Start: 2022-02-27 — End: 2022-02-27

## 2022-02-27 MED ORDER — PANTOPRAZOLE SODIUM 40 MG IV SOLR
40.0000 mg | Freq: Every day | INTRAVENOUS | Status: DC
  Administered 2022-02-27 – 2022-02-28 (×2): 40 mg via INTRAVENOUS
  Filled 2022-02-27 (×2): qty 10

## 2022-02-27 MED ORDER — DIPHENHYDRAMINE HCL 25 MG PO CAPS
25.0000 mg | ORAL_CAPSULE | Freq: Four times a day (QID) | ORAL | Status: DC
  Administered 2022-02-27 – 2022-03-07 (×33): 25 mg via ORAL
  Filled 2022-02-27 (×34): qty 1

## 2022-02-27 MED ORDER — LACTATED RINGERS IV SOLN: INTRAVENOUS | Status: DC

## 2022-02-27 MED ORDER — MEPERIDINE HCL 25 MG/ML IJ SOLN: 6.2500 mg | INTRAMUSCULAR | Status: DC | PRN

## 2022-02-27 MED ORDER — VITAMIN D 25 MCG (1000 UNIT) PO TABS
5000.0000 [IU] | ORAL_TABLET | Freq: Every day | ORAL | Status: DC
  Administered 2022-02-27 – 2022-03-07 (×9): 5000 [IU] via ORAL
  Filled 2022-02-27 (×9): qty 5

## 2022-02-27 MED ORDER — HYDROMORPHONE HCL 1 MG/ML IJ SOLN
0.5000 mg | INTRAMUSCULAR | Status: DC | PRN
  Administered 2022-02-27: 1 mg via INTRAVENOUS
  Administered 2022-02-27: 0.5 mg via INTRAVENOUS
  Administered 2022-02-28 – 2022-03-01 (×4): 1 mg via INTRAVENOUS
  Filled 2022-02-27 (×6): qty 1

## 2022-02-27 MED ORDER — 0.9 % SODIUM CHLORIDE (POUR BTL) OPTIME
TOPICAL | Status: DC | PRN
  Administered 2022-02-27: 1000 mL
  Administered 2022-02-27: 2000 mL
  Administered 2022-02-27: 1000 mL

## 2022-02-27 MED ORDER — FENTANYL CITRATE (PF) 250 MCG/5ML IJ SOLN
INTRAMUSCULAR | Status: AC
  Filled 2022-02-27: qty 5

## 2022-02-27 MED ORDER — THROMBIN 20000 UNITS EX SOLR
CUTANEOUS | Status: DC | PRN
  Administered 2022-02-27 (×2): 20 mL via TOPICAL

## 2022-02-27 MED ORDER — MANNITOL 25 % IV SOLN
INTRAVENOUS | Status: DC | PRN
  Administered 2022-02-27: 60 g via INTRAVENOUS

## 2022-02-27 MED ORDER — DEXAMETHASONE SODIUM PHOSPHATE 4 MG/ML IJ SOLN
4.0000 mg | Freq: Three times a day (TID) | INTRAMUSCULAR | Status: DC
  Administered 2022-03-01 – 2022-03-04 (×9): 4 mg via INTRAVENOUS
  Filled 2022-02-27 (×9): qty 1

## 2022-02-27 MED ORDER — LIDOCAINE-EPINEPHRINE 1 %-1:100000 IJ SOLN
INTRAMUSCULAR | Status: DC | PRN
  Administered 2022-02-27: 10 mL

## 2022-02-27 MED ORDER — LIDOCAINE 2% (20 MG/ML) 5 ML SYRINGE
INTRAMUSCULAR | Status: DC | PRN
Start: 2022-02-27 — End: 2022-02-27
  Administered 2022-02-27: 40 mg via INTRAVENOUS

## 2022-02-27 MED ORDER — ONDANSETRON HCL 4 MG/2ML IJ SOLN: 4.0000 mg | INTRAMUSCULAR | Status: DC | PRN

## 2022-02-27 MED ORDER — ROSUVASTATIN CALCIUM 20 MG PO TABS
20.0000 mg | ORAL_TABLET | Freq: Every day | ORAL | Status: DC
  Administered 2022-02-28 – 2022-03-07 (×8): 20 mg via ORAL
  Filled 2022-02-27 (×8): qty 1

## 2022-02-27 MED ORDER — DEXAMETHASONE SODIUM PHOSPHATE 4 MG/ML IJ SOLN
4.0000 mg | Freq: Four times a day (QID) | INTRAMUSCULAR | Status: AC
  Administered 2022-02-28 – 2022-03-01 (×4): 4 mg via INTRAVENOUS
  Filled 2022-02-27 (×4): qty 1

## 2022-02-27 MED ORDER — INSULIN ASPART 100 UNIT/ML IJ SOLN
0.0000 [IU] | INTRAMUSCULAR | Status: DC | PRN
  Administered 2022-02-27: 2 [IU] via SUBCUTANEOUS
  Filled 2022-02-27: qty 1

## 2022-02-27 MED ORDER — LIDOCAINE 2% (20 MG/ML) 5 ML SYRINGE
INTRAMUSCULAR | Status: AC
  Filled 2022-02-27: qty 5

## 2022-02-27 MED ORDER — ADULT MULTIVITAMIN W/MINERALS CH
1.0000 | ORAL_TABLET | Freq: Every day | ORAL | Status: DC
  Administered 2022-02-27 – 2022-03-07 (×9): 1 via ORAL
  Filled 2022-02-27 (×9): qty 1

## 2022-02-27 MED ORDER — CHLORHEXIDINE GLUCONATE CLOTH 2 % EX PADS: 6.0000 | MEDICATED_PAD | Freq: Once | CUTANEOUS | Status: DC

## 2022-02-27 SURGICAL SUPPLY — 73 items
BAG COUNTER SPONGE SURGICOUNT (BAG) ×3 IMPLANT
BAG DECANTER FOR FLEXI CONT (MISCELLANEOUS) ×3 IMPLANT
BAG SPNG CNTER NS LX DISP (BAG) ×1
BAND INSRT 18 STRL LF DISP RB (MISCELLANEOUS) ×2
BAND RUBBER #18 3X1/16 STRL (MISCELLANEOUS) ×2 IMPLANT
BLADE CLIPPER SURG (BLADE) ×3 IMPLANT
BNDG COHESIVE 4X5 TAN STRL (GAUZE/BANDAGES/DRESSINGS) ×2 IMPLANT
BUR ACORN 6.0 PRECISION (BURR) ×3 IMPLANT
BUR SPIRAL ROUTER 2.3 (BUR) ×1 IMPLANT
CANISTER SUCT 3000ML PPV (MISCELLANEOUS) ×6 IMPLANT
CARTRIDGE OIL MAESTRO DRILL (MISCELLANEOUS) ×2 IMPLANT
CLIP VESOCCLUDE MED 6/CT (CLIP) ×2 IMPLANT
CNTNR URN SCR LID CUP LEK RST (MISCELLANEOUS) ×2 IMPLANT
CONT SPEC 4OZ STRL OR WHT (MISCELLANEOUS) ×2
COVER BURR HOLE 14 (Orthopedic Implant) ×1 IMPLANT
COVER BURR HOLE UNIV 10 (Orthopedic Implant) ×2 IMPLANT
DIFFUSER DRILL AIR PNEUMATIC (MISCELLANEOUS) ×3 IMPLANT
DRAIN CHANNEL 10M FLAT 3/4 FLT (DRAIN) IMPLANT
DRAPE CAMERA VIDEO/LASER (DRAPES) IMPLANT
DRAPE MICROSCOPE LEICA (MISCELLANEOUS) ×3 IMPLANT
DRAPE NEUROLOGICAL W/INCISE (DRAPES) ×3 IMPLANT
DRAPE STERI IOBAN 125X83 (DRAPES) IMPLANT
DRAPE SURG 17X23 STRL (DRAPES) IMPLANT
DRAPE WARM FLUID 44X44 (DRAPES) ×3 IMPLANT
ELECT CAUTERY BLADE 6.4 (BLADE) ×3 IMPLANT
ELECT REM PT RETURN 9FT ADLT (ELECTROSURGICAL) ×2
ELECTRODE REM PT RTRN 9FT ADLT (ELECTROSURGICAL) ×2 IMPLANT
EVACUATOR SILICONE 100CC (DRAIN) IMPLANT
FORCEPS BIPOLAR SPETZLER 8 1.0 (NEUROSURGERY SUPPLIES) ×1 IMPLANT
GAUZE 4X4 16PLY ~~LOC~~+RFID DBL (SPONGE) ×2 IMPLANT
GAUZE SPONGE 4X4 12PLY STRL (GAUZE/BANDAGES/DRESSINGS) ×3 IMPLANT
GLOVE BIO SURGEON STRL SZ 6 (GLOVE) ×1 IMPLANT
GLOVE BIO SURGEON STRL SZ7 (GLOVE) ×3 IMPLANT
GLOVE BIOGEL PI IND STRL 7.0 (GLOVE) IMPLANT
GLOVE BIOGEL PI INDICATOR 7.0 (GLOVE) ×2
GLOVE ECLIPSE 9.0 STRL (GLOVE) ×3 IMPLANT
GLOVE EXAM NITRILE XL STR (GLOVE) IMPLANT
GOWN STRL REUS W/ TWL LRG LVL3 (GOWN DISPOSABLE) IMPLANT
GOWN STRL REUS W/ TWL XL LVL3 (GOWN DISPOSABLE) IMPLANT
GOWN STRL REUS W/TWL 2XL LVL3 (GOWN DISPOSABLE) IMPLANT
GOWN STRL REUS W/TWL LRG LVL3 (GOWN DISPOSABLE) ×2
GOWN STRL REUS W/TWL XL LVL3 (GOWN DISPOSABLE) ×6
GRAFT DURAGEN MATRIX 5WX7L (Graft) ×1 IMPLANT
HEMOSTAT POWDER KIT SURGIFOAM (HEMOSTASIS) ×8 IMPLANT
HEMOSTAT SURGICEL 2X14 (HEMOSTASIS) ×3 IMPLANT
KIT BASIN OR (CUSTOM PROCEDURE TRAY) ×3 IMPLANT
KIT TURNOVER KIT B (KITS) ×3 IMPLANT
MESH DYNAMIC MALL LG 1.7X120 (Mesh General) ×1 IMPLANT
NDL HYPO 18GX1.5 BLUNT FILL (NEEDLE) IMPLANT
NDL HYPO 25X1 1.5 SAFETY (NEEDLE) ×2 IMPLANT
NEEDLE HYPO 18GX1.5 BLUNT FILL (NEEDLE) IMPLANT
NEEDLE HYPO 25X1 1.5 SAFETY (NEEDLE) ×2 IMPLANT
NS IRRIG 1000ML POUR BTL (IV SOLUTION) ×8 IMPLANT
OIL CARTRIDGE MAESTRO DRILL (MISCELLANEOUS) ×2
PACK CRANIOTOMY CUSTOM (CUSTOM PROCEDURE TRAY) ×3 IMPLANT
PAD ARMBOARD 7.5X6 YLW CONV (MISCELLANEOUS) ×9 IMPLANT
PATTIES SURGICAL .25X.25 (GAUZE/BANDAGES/DRESSINGS) IMPLANT
PATTIES SURGICAL .5 X.5 (GAUZE/BANDAGES/DRESSINGS) IMPLANT
PATTIES SURGICAL .5 X3 (DISPOSABLE) IMPLANT
PATTIES SURGICAL 1X1 (DISPOSABLE) ×1 IMPLANT
SCREW UNIII AXS SD 1.5X4 (Screw) ×22 IMPLANT
SPONGE NEURO XRAY DETECT 1X3 (DISPOSABLE) ×1 IMPLANT
SPONGE SURGIFOAM ABS GEL 100 (HEMOSTASIS) ×4 IMPLANT
STAPLER VISISTAT 35W (STAPLE) ×3 IMPLANT
STOCKINETTE TUBULAR 6 INCH (GAUZE/BANDAGES/DRESSINGS) ×1 IMPLANT
SUT NURALON 4 0 TR CR/8 (SUTURE) ×8 IMPLANT
SUT VIC AB 2-0 CT2 18 VCP726D (SUTURE) ×6 IMPLANT
SYR CONTROL 10ML LL (SYRINGE) ×3 IMPLANT
TOWEL GREEN STERILE (TOWEL DISPOSABLE) ×3 IMPLANT
TOWEL GREEN STERILE FF (TOWEL DISPOSABLE) ×3 IMPLANT
TRAY FOLEY MTR SLVR 16FR STAT (SET/KITS/TRAYS/PACK) ×3 IMPLANT
UNDERPAD 30X36 HEAVY ABSORB (UNDERPADS AND DIAPERS) ×3 IMPLANT
WATER STERILE IRR 1000ML POUR (IV SOLUTION) ×3 IMPLANT

## 2022-02-27 NOTE — Brief Op Note (Signed)
02/27/2022 ? ?11:07 AM ? ?PATIENT:  Patrick Buchanan  71 y.o. male ? ?PRE-OPERATIVE DIAGNOSIS:  Meningioma ? ?POST-OPERATIVE DIAGNOSIS:  Meningioma ? ?PROCEDURE:  Procedure(s): ?Craniotomy - bilateral - Parietal (Bilateral) ? ?SURGEON:  Surgeon(s) and Role: ?   Earnie Larsson, MD - Primary ? ?PHYSICIAN ASSISTANT:  ? ?ASSISTANTS: Bergman,NP  ? ?ANESTHESIA:   general ? ?EBL:  2050 mL  ? ?BLOOD ADMINISTERED:none ? ?DRAINS: none  ? ?LOCAL MEDICATIONS USED:  XYLOCAINE  ? ?SPECIMEN:  Source of Specimen:  right parasagittal extraaxial brain mass ? ?DISPOSITION OF SPECIMEN:  PATHOLOGY ? ?COUNTS:  YES ? ?TOURNIQUET:  * No tourniquets in log * ? ?DICTATION: .Dragon Dictation ? ?PLAN OF CARE: Admit to inpatient  ? ?PATIENT DISPOSITION:  PACU - hemodynamically stable. ?  ?Delay start of Pharmacological VTE agent (>24hrs) due to surgical blood loss or risk of bleeding: yes ? ?

## 2022-02-27 NOTE — Anesthesia Procedure Notes (Signed)
Arterial Line Insertion ?Start/End5/05/2022 7:05 AM, 02/27/2022 7:15 AM ?Performed by: Inda Coke, CRNA, CRNA ? Patient location: Pre-op. ?Preanesthetic checklist: patient identified, IV checked, site marked, risks and benefits discussed, surgical consent, monitors and equipment checked, pre-op evaluation and anesthesia consent ?Lidocaine 1% used for infiltration ?Left, radial was placed ?Catheter size: 20 G ?Hand hygiene performed  and maximum sterile barriers used  ? ?Attempts: 1 ?Procedure performed without using ultrasound guided technique. ?Ultrasound Notes:anatomy identified, needle tip was noted to be adjacent to the nerve/plexus identified and no ultrasound evidence of intravascular and/or intraneural injection ?Following insertion, dressing applied and Biopatch. ?Post procedure assessment: normal and unchanged ? ?Patient tolerated the procedure well with no immediate complications. ? ? ?

## 2022-02-27 NOTE — Progress Notes (Signed)
?  Transition of Care (TOC) Screening Note ? ? ?Patient Details  ?Name: Patrick Buchanan ?Date of Birth: [birth year] ? ? ?Transition of Care (TOC) CM/SW Contact:    ?Benard Halsted, LCSW ?Phone Number: ?02/27/2022, 4:35 PM ? ? ? ?Transition of Care Department [facility]) has reviewed patient and no TOC needs have been identified at this time. We will continue to monitor patient advancement through interdisciplinary progression rounds. If new patient transition needs arise, please place a TOC consult. ? ? ?

## 2022-02-27 NOTE — H&P (Signed)
Patrick Buchanan is an 71 y.o. male.   ?Chief Complaint: Brain tumor ?HPI: 71 year old male with progressive left-sided weakness more prominently involving his left lower extremity.  Patient with a prior history of aneurysm clipping and cannot have MRI scanning.  CT scan with contrast demonstrates evidence of enhancing masses first in his right parasagittal parietal region with marked associated mass effect.  Tumor is very large approaching 6 cm.  Patient was secondary dural based tumors the first in his left posterior temporal region and left sylvian region also consistent with meningiomas.  Patient with history of prior spinal meningioma resection.  Patient with increasing weakness of his left lower extremity with gait difficulty.  Some mild headaches.  Patient presents now for resection of this large symptomatic tumor.  No plans on resection of the other 2 intracranial tumors at this time. ? ?Past Medical History:  ?Diagnosis Date  ? Allergy   ? hay fever  ? Blood transfusion without reported diagnosis   ? Cerebral aneurysm 1992  ? "leaking"; treated by Dr Sherwood Gambler  ? Depression   ? Diabetes mellitus without complication ([place])   ? type 2  ? GERD (gastroesophageal reflux disease)   ? past hx   ? ? ?Past Surgical History:  ?Procedure Laterality Date  ? APPENDECTOMY    ? CEREBRAL ANEURYSM REPAIR  1992  ? clipping by Dr Sherwood Gambler; cannot have an MRI  ? HERNIA REPAIR N/A 1990  ? inguinal  ? INGUINAL HERNIA REPAIR    ? as a child and then another at 30 yrs old  ? TONSILLECTOMY    ? TUMOR EXCISION  2005  ? benign cervical  ? ? ?Family History  ?Problem Relation Age of Onset  ? Breast cancer Mother   ? Diabetes Brother   ? Colon cancer Neg Hx   ? Colon polyps Neg Hx   ? Esophageal cancer Neg Hx   ? Rectal cancer Neg Hx   ? Stomach cancer Neg Hx   ? ?Social History:  reports that he quit smoking about 18 years ago. His smoking use included cigarettes. He has a 10.00 pack-year smoking history. He has never used smokeless  tobacco. He reports that he does not currently use alcohol. He reports that he does not currently use drugs. ? ?Allergies: No Known Allergies ? ?Medications Prior to Admission  ?Medication Sig Dispense Refill  ? chlorpheniramine (CHLOR-TRIMETON) 4 MG tablet Take 4 mg by mouth 2 (two) times daily as needed for allergies.    ? Cholecalciferol 125 MCG (5000 UT) TABS Take 5,000 Units by mouth daily.    ? dexamethasone (DECADRON) 2 MG tablet Take 2 mg by mouth 2 (two) times daily.    ? escitalopram (LEXAPRO) 20 MG tablet TAKE 1 TABLET (20 MG TOTAL) BY MOUTH DAILY. 90 tablet 1  ? gabapentin (NEURONTIN) 100 MG capsule TAKE 1 CAPSULE BY MOUTH 2 TIMES DAILY. 180 capsule 0  ? lamoTRIgine (LAMICTAL) 100 MG tablet Take 1 tablet (100 mg total) by mouth 2 (two) times daily. 60 tablet 11  ? Multiple Vitamin (MULTIVITAMIN) tablet Take 1 tablet by mouth daily.    ? naphazoline-pheniramine (VISINE) 0.025-0.3 % ophthalmic solution Place 1 drop into both eyes 4 (four) times daily as needed for eye irritation.    ? polyvinyl alcohol (LIQUIFILM TEARS) 1.4 % ophthalmic solution Place 1 drop into both eyes as needed for dry eyes.    ? rosuvastatin (CRESTOR) 20 MG tablet Take 1 tablet (20 mg total) by mouth daily.  90 tablet 3  ? Vitamin D, Ergocalciferol, (DRISDOL) 1.25 MG (50000 UNIT) CAPS capsule Take one tablet wkly (Patient taking differently: Take 50,000 Units by mouth every Sunday. Take one tablet wkly) 12 capsule 3  ? ? ?Results for orders placed or performed during the hospital encounter of 02/27/22 (from the past 48 hour(s))  ?Glucose, capillary     Status: Abnormal  ? Collection Time: 02/27/22  6:27 AM  ?Result Value Ref Range  ? Glucose-Capillary 142 (H) 70 - 99 mg/dL  ?  Comment: Glucose reference range applies only to samples taken after fasting for at least 8 hours.  ?Prepare RBC (crossmatch)     Status: None  ? Collection Time: 02/27/22  7:28 AM  ?Result Value Ref Range  ? Order Confirmation    ?  ORDER PROCESSED BY BLOOD  BANK ?Performed at [facility], [place] [address], [place], [place] [address] ?  ? ?No results found. ? ?Pertinent items noted in HPI and remainder of comprehensive ROS otherwise negative. ? ?Blood pressure 138/83, pulse 71, temperature 98.3 ?F (36.8 ?C), temperature source Oral, resp. rate 17, height 6' (1.829 m), weight 98.9 kg, SpO2 96 %. ? ?Patient is awake and alert.  He is oriented and appropriate.  His speech is fluent.  His judgment and insight are intact.  Cranial nerve function with some mild left-sided central facial weakness.  Tongue protrudes to the midline.  Shoulder shrug equally.  Motor examination 5/5 right upper and right lower extremity.  4+/5 strength in his left upper extremity with pronator drift.  4/5 strength with some spasticity in his left lower extremity.  Sensory examination was decreased sensation in his left hemibody.  Examination head ears eyes nose and throat demonstrates evidence of bony irregularity at the vertex of his skull secondary to tumor erosion.  No other evidence of bony abnormalities.  Oropharynx, nasopharynx and external auditory canals are clear.  Chest and abdomen benign.  Extremities are free of major deformity. ?Assessment/Plan ?Large right parasagittal dural based tumor, likely meningioma.  Plan biparietal craniotomy and resection of tumor.  Risks and benefits of been explained.  Patient wishes to proceed. ? ?Fifi Schindler A Norely Schlick ?02/27/2022, 7:53 AM ? ? ? ? ?

## 2022-02-27 NOTE — Transfer of Care (Signed)
Immediate Anesthesia Transfer of Care Note ? ?Patient: Patrick Buchanan ? ?Procedure(s) Performed: Craniotomy - bilateral - Parietal (Bilateral: Head) ? ?Patient Location: PACU ? ?Anesthesia Type:General ? ?Level of Consciousness: awake, alert  and oriented ? ?Airway & Oxygen Therapy: Patient Spontanous Breathing ? ?Post-op Assessment: Report given to RN, Post -op Vital signs reviewed and stable and Patient able to stick tongue midline ? ?Post vital signs: Reviewed and stable ? ?Last Vitals:  ?Vitals Value Taken Time  ?BP 128/66 02/27/22 1129  ?Temp    ?Pulse 74 02/27/22 1134  ?Resp 16 02/27/22 1134  ?SpO2 97 % 02/27/22 1134  ?Vitals shown include unvalidated device data. ? ?Last Pain:  ?Vitals:  ? 02/27/22 0705  ?TempSrc:   ?PainSc: 0-No pain  ?   ? ?  ? ?Complications: No notable events documented. ?

## 2022-02-27 NOTE — Op Note (Signed)
Date of procedure: 02/27/2022 ? ?Date of dictation: Same ? ?Service: Neurosurgery ? ?Preoperative diagnosis: Large right parasagittal extra-axial brain mass, multifocal with history of multiple meningiomas. ? ?Postoperative diagnosis: Same ? ?Procedure Name: Bilateral parietal craniotomy with resection of parasagittal tumor greater than 6 cm in diameter, microdissection, partial craniectomy for resection of tumor invading through the skull ? ?Mesh cranioplasty approximately 3 x 6 cm ? ?Surgeon:Sammuel Blick A.Pami Wool, M.D. ? ?Asst. Surgeon: Reinaldo Meeker, NP ? ?Anesthesia: General ? ?Indication: 71 year old male with progressive left lower extremity and upper extremity weakness.  Work-up demonstrates evidence of severe multifocal disease consistent with multiple meningiomas.  Patient with a prior history of spinal meningioma status postresection patient also with history of aneurysm clipping with a not MRI compatible clip in place.  Patient's largest tumor is right parasagittal.  His multifocal in nature.  He has marked surrounding edema.  There is questionable invasion of the superior sagittal sinus.  Patient also with a left sphenoid wing meningioma and a left posterior temporal meningioma.  Those will not be addressed today.  Plan is for bilateral lateral parietal craniotomy with resection of the large parasagittal tumor as it is felt to be the most symptomatic and certainly the largest of the 3 major meningiomas. ? ?Operative note: After induction of anesthesia, patient positioned supine with his head fixed in the [place] pin headrest and slightly flexed.  Patient's scalp was prepped and draped sterilely.  A linear incision is made in the parietal region bilaterally.  Self-retaining tractor was placed.  There was tumor that was grossly invading through the skull.  Bur holes were made on either side of the superior sagittal sinus.  A lateral bur hole was also made on the right side.  Craniotomy was then performed around the skull  erosion and extending significantly posteriorly as this is where the main body of the tumor resided.  Bone flap was elevated.  The superior sagittal sinus was grossly intact.  Bleeding was controlled with Gelfoam.  The dura was opened and reflected along the superior sagittal sinus.  This was dissected free from the underlying brain tumor.  The dura was held in place with traction sutures.  Working around the brain tumor circumferentially feeding vessels were coagulated and cut.  The tumor was very vascular and bled continuously.  The tumor was debulked centrally.  This allowed easier access and the periphery of the tumor was gradually dissected free from the surrounding brain.  A large resection mass was delivered sent to pathology.  There were number areas of other satellite meningiomas which were dissected free and removed particularly a calcified meningioma which was more anteriorly along the falx and a deeper tumor collection in the central right parietal region.  Hemostasis was difficult to achieve but eventually this was controlled with bipolar electrocautery and Surgifoam.  A gross total resection was performed.  As the tumor was invading the dura itself the dura was resected but there was some invasion of the lateral wall of the superior sagittal sinus.  This was oversewed with 4-0 Nurolon with good control.  Wound was then irrigated.  The resection cavity was lined with Surgicel as was the somewhat macerated cortex that was left behind.  DuraGen was placed over the dural defect.  The skull flap was examined and the invading tumor was identified.  Craniectomy was then performed with good margin.  The skull flap was then replaced using plates and screws.  The cranial defect was repaired with mesh cranioplasty.  Wound is then  closed in layers.  No apparent complications.  Patient tolerated the procedure well and he returned to the recovery room postop. ? ?

## 2022-02-27 NOTE — Anesthesia Postprocedure Evaluation (Signed)
Anesthesia Post Note ? ?Patient: Patrick Buchanan ? ?Procedure(s) Performed: Craniotomy - bilateral - Parietal (Bilateral: Head) ? ?  ? ?Patient location during evaluation: PACU ?Anesthesia Type: General ?Level of consciousness: awake and alert, patient cooperative and oriented ?Pain management: pain level controlled ?Vital Signs Assessment: post-procedure vital signs reviewed and stable ?Respiratory status: spontaneous breathing, nonlabored ventilation, respiratory function stable and patient connected to nasal cannula oxygen ?Cardiovascular status: blood pressure returned to baseline and stable ?Postop Assessment: no apparent nausea or vomiting ?Anesthetic complications: no ?Comments: Pt beginning to move L fingers and toes, still profoundly weak on L.  Dr Annette Stable aware. ? ? ?No notable events documented. ? ?Last Vitals:  ?Vitals:  ? 02/27/22 0619 02/27/22 1130  ?BP: 138/83 128/66  ?Pulse: 71 75  ?Resp: 17 15  ?Temp: 36.8 ?C 36.6 ?C  ?SpO2: 96% 98%  ?  ?Last Pain:  ?Vitals:  ? 02/27/22 0705  ?TempSrc:   ?PainSc: 0-No pain  ? ? ?  ?  ?  ?  ?  ?  ? ?[name],E. Yvone Slape ? ? ? ? ?

## 2022-02-27 NOTE — Anesthesia Procedure Notes (Signed)
Procedure Name: Intubation ?Date/Time: 02/27/2022 8:23 AM ?Performed by: Harden Mo, CRNA ?Pre-anesthesia Checklist: Patient identified, Emergency Drugs available, Suction available and Patient being monitored ?Patient Re-evaluated:Patient Re-evaluated prior to induction ?Oxygen Delivery Method: Circle System Utilized ?Preoxygenation: Pre-oxygenation with 100% oxygen ?Induction Type: IV induction ?Ventilation: Mask ventilation without difficulty and Oral airway inserted - appropriate to patient size ?Laryngoscope Size: Sabra Heck and 2 ?Grade View: Grade I ?Tube type: Oral ?Tube size: 7.5 mm ?Number of attempts: 1 ?Airway Equipment and Method: Stylet and Oral airway ?Placement Confirmation: ETT inserted through vocal cords under direct vision, positive ETCO2 and breath sounds checked- equal and bilateral ?Secured at: 23 cm ?Tube secured with: Tape ?Dental Injury: Teeth and Oropharynx as per pre-operative assessment  ? ? ? ? ?

## 2022-02-28 ENCOUNTER — Encounter (HOSPITAL_COMMUNITY): Payer: Self-pay | Admitting: Neurosurgery

## 2022-02-28 ENCOUNTER — Inpatient Hospital Stay (HOSPITAL_COMMUNITY): Payer: Medicare HMO

## 2022-02-28 LAB — CBC
HCT: 31.6 % — ABNORMAL LOW (ref 39.0–52.0)
Hemoglobin: 10.8 g/dL — ABNORMAL LOW (ref 13.0–17.0)
MCH: 31.4 pg (ref 26.0–34.0)
MCHC: 34.2 g/dL (ref 30.0–36.0)
MCV: 91.9 fL (ref 80.0–100.0)
Platelets: 227 10*3/uL (ref 150–400)
RBC: 3.44 MIL/uL — ABNORMAL LOW (ref 4.22–5.81)
RDW: 13.5 % (ref 11.5–15.5)
WBC: 17.4 10*3/uL — ABNORMAL HIGH (ref 4.0–10.5)
nRBC: 0 % (ref 0.0–0.2)

## 2022-02-28 LAB — BASIC METABOLIC PANEL
Anion gap: 7 (ref 5–15)
BUN: 19 mg/dL (ref 8–23)
CO2: 22 mmol/L (ref 22–32)
Calcium: 8 mg/dL — ABNORMAL LOW (ref 8.9–10.3)
Chloride: 106 mmol/L (ref 98–111)
Creatinine, Ser: 0.71 mg/dL (ref 0.61–1.24)
GFR, Estimated: >60 mL/min (ref >60)
Glucose, Bld: 147 mg/dL — ABNORMAL HIGH (ref 70–99)
Potassium: 4.4 mmol/L (ref 3.5–5.1)
Sodium: 135 mmol/L (ref 135–145)

## 2022-02-28 MED ORDER — MAGNESIUM HYDROXIDE 400 MG/5ML PO SUSP
30.0000 mL | Freq: Every day | ORAL | Status: DC | PRN
  Administered 2022-03-01: 30 mL via ORAL
  Filled 2022-02-28: qty 30

## 2022-02-28 MED ORDER — SIMETHICONE 80 MG PO CHEW
80.0000 mg | CHEWABLE_TABLET | Freq: Four times a day (QID) | ORAL | Status: DC | PRN
  Administered 2022-02-28 – 2022-03-06 (×8): 80 mg via ORAL
  Filled 2022-02-28 (×8): qty 1

## 2022-02-28 MED ORDER — IOHEXOL 350 MG/ML SOLN
75.0000 mL | Freq: Once | INTRAVENOUS | Status: AC | PRN
  Administered 2022-02-28: 75 mL via INTRAVENOUS

## 2022-02-28 MED FILL — Thrombin For Soln 5000 Unit: CUTANEOUS | Qty: 2 | Status: AC

## 2022-02-28 MED FILL — Thrombin For Soln 5000 Unit: CUTANEOUS | Qty: 5000 | Status: AC

## 2022-02-28 MED FILL — Thrombin For Soln 20000 Unit: CUTANEOUS | Qty: 1 | Status: AC

## 2022-02-28 NOTE — Progress Notes (Signed)
Postop day 1.  No issues or problems overnight.  No headaches. ? ?He is afebrile.  His vital signs are stable.  He is awake and alert.  He is oriented and appropriate.  Speech is fluent.  Judgment insight are intact.  Cranial nerve function normal bilaterally.  Motor examination the extremities reveals 5/5 strength in his right upper and right lower extremity.  Left upper extremity with good movement of his hand and fine motor control but no voluntary movement of his proximal left upper extremity.  Left lower extremity with spastic weakness and no voluntary movement. ? ?Follow-up head CT scan demonstrates good appearance of his resection without evidence of obvious complicating features.  No evidence of hemorrhage.  No evidence for progression of his other tumors.  No evidence of obvious infarct. ?

## 2022-02-28 NOTE — Evaluation (Signed)
Physical Therapy Evaluation ?Patient Details ?Name: Patrick Buchanan ?MRN: [id number] ?DOB: January 30, 1951 ?Today's Date: 02/28/2022 ? ?History of Present Illness ? Pt is a 71 y.o. 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 Significant PMH: cerebral aneurysm, DM.  ?Clinical Impression ? Pt admitted with above. Pt presents with left hemiplegia, impaired tone, balance deficits, and decreased cognition. Pt requiring mod-max assist for rolling to R/L and two person maximal assist for bed mobility. Emphasis on promoting midline positioning and upper trunk control to achieve static sitting balance. Pt with urinary urgency, so returned to supine and RN/NT present to assist with bed linen change. Overall, pt demonstrated good activity tolerance throughout. Suspect good progress given high levels of motivation, age, PLOF. Will continue to progress bed mobility, balance, and initiate transfer training next session. ?   ? ?Recommendations for follow up therapy are one component of a multi-disciplinary discharge planning process, led by the attending physician.  Recommendations may be updated based on patient status, additional functional criteria and insurance authorization. ? ?Follow Up Recommendations Acute inpatient rehab (3hours/day) ? ?  ?Assistance Recommended at Discharge Frequent or constant Supervision/Assistance  ?Patient can return home with the following ? Two people to help with walking and/or transfers;Two people to help with bathing/dressing/bathroom ? ?  ?Equipment Recommendations Wheelchair (measurements PT);Wheelchair cushion (measurements PT);BSC/3in1  ?Recommendations for Other Services ?    ?  ?Functional Status Assessment Patient has had a recent decline in their functional status and demonstrates the ability to make significant improvements in function in a reasonable and  predictable amount of time.  ? ?  ?Precautions / Restrictions Precautions ?Precautions: Fall;Other (comment) ?Precaution Comments: L hemi ?Restrictions ?Weight Bearing Restrictions: No  ? ?  ? ?Mobility ? Bed Mobility ?Overal bed mobility: Needs Assistance ?Bed Mobility: Supine to Sit, Sit to Supine, Rolling ?Rolling: Mod assist, Max assist ?  ?Supine to sit: Max assist, +2 for physical assistance ?Sit to supine: Max assist, +2 for physical assistance ?  ?General bed mobility comments: Cues for bridging through RLE to bring hips over towards right side of bed. Assist with L side management and use of bed pad to scoot hips forward to edge. Pt able to progress back down onto R forearm and BLE elevation provided back into bed. Rolling to R with maxA, rolling to L with modA ?  ? ?Transfers ?  ?  ?  ?  ?  ?  ?  ?  ?  ?General transfer comment: deferred due to need to lie back down and change sheets ?  ? ?Ambulation/Gait ?  ?  ?  ?  ?  ?  ?  ?  ? ?Stairs ?  ?  ?  ?  ?  ? ?Wheelchair Mobility ?  ? ?Modified Rankin (Stroke Patients Only) ?Modified Rankin (Stroke Patients Only) ?Pre-Morbid Rankin Score: No symptoms ?Modified Rankin: Severe disability ? ?  ? ?Balance Overall balance assessment: Needs assistance ?Sitting-balance support: Feet supported, Single extremity supported ?Sitting balance-Leahy Scale: Poor ?Sitting balance - Comments: requiring up to maxA, progressing intermittently to minA. Demonstrates right lateral and posterior lean. Max multimodal cues for cervical extension, scapular retraction, anterior weight shift and truncal rotation to midline (tends to rotate towards left) ?  ?  ?  ?  ?  ?  ?  ?  ?  ?  ?  ?  ?  ?  ?  ?   ? ? ? ?  Pertinent Vitals/Pain Pain Assessment ?Pain Assessment: No/denies pain  ? ? ?Home Living Family/patient expects to be discharged to:: Private residence ?Living Arrangements: Alone ?Available Help at Discharge: Family ?Type of Home: House ?Home Access: Stairs to enter ?Entrance  Stairs-Rails: Left ?Entrance Stairs-Number of Steps: 3 ?  ?Home Layout: Able to live on main level with bedroom/bathroom ?Home Equipment: Cane - single Barista (2 wheels) ?   ?  ?Prior Function Prior Level of Function : Independent/Modified Independent ?  ?  ?  ?  ?  ?  ?Mobility Comments: Retired Midwife ?  ?  ? ? ?Hand Dominance  ?   ? ?  ?Extremity/Trunk Assessment  ? Upper Extremity Assessment ?Upper Extremity Assessment: Defer to OT evaluation ?  ? ?Lower Extremity Assessment ?Lower Extremity Assessment: RLE deficits/detail;LLE deficits/detail ?RLE Deficits / Details: WFL ?LLE Deficits / Details: No active movement noted. Increased tone throughout ?  ? ?Cervical / Trunk Assessment ?Cervical / Trunk Assessment: Kyphotic  ?Communication  ? Communication: No difficulties  ?Cognition Arousal/Alertness: Awake/alert ?Behavior During Therapy: [facility] for tasks assessed/performed ?Overall Cognitive Status: Impaired/Different from baseline ?Area of Impairment: Attention, Following commands, Safety/judgement, Awareness, Problem solving ?  ?  ?  ?  ?  ?  ?  ?  ?  ?Current Attention Level: Sustained, Selective ?  ?Following Commands: Follows multi-step commands inconsistently ?Safety/Judgement: Decreased awareness of safety, Decreased awareness of deficits ?Awareness: Emergent ?Problem Solving: Requires verbal cues ?General Comments: Needs cues for attending to tasks, verbose. Likes to direct care, which was encouraged, but can be very particular ?  ?  ? ?  ?General Comments   ? ?  ?Exercises General Exercises - Lower Extremity ?Heel Slides: PROM, Left, 5 reps, Supine  ? ?Assessment/Plan  ?  ?PT Assessment Patient needs continued PT services  ?PT Problem List Decreased strength;Decreased activity tolerance;Decreased balance;Decreased range of motion;Decreased mobility;Decreased coordination;Decreased cognition;Decreased safety awareness ? ?   ?  ?PT Treatment Interventions DME instruction;Gait  training;Functional mobility training;Therapeutic activities;Therapeutic exercise;Balance training;Neuromuscular re-education;Cognitive remediation;Patient/family education;Wheelchair mobility training   ? ?PT Goals (Current goals can be found in the Care Plan section)  ?Acute Rehab PT Goals ?Patient Stated Goal: for improved left sided strength ?PT Goal Formulation: With patient/family ?Time For Goal Achievement: 03/14/22 ?Potential to Achieve Goals: Good ? ?  ?Frequency Min 4X/week ?  ? ? ?Co-evaluation   ?  ?  ?  ?  ? ? ?  ?AM-PAC PT "6 Clicks" Mobility  ?Outcome Measure Help needed turning from your back to your side while in a flat bed without using bedrails?: A Lot ?Help needed moving from lying on your back to sitting on the side of a flat bed without using bedrails?: Total ?Help needed moving to and from a bed to a chair (including a wheelchair)?: Total ?Help needed standing up from a chair using your arms (e.g., wheelchair or bedside chair)?: Total ?Help needed to walk in hospital room?: Total ?Help needed climbing 3-5 steps with a railing? : Total ?6 Click Score: 7 ? ?  ?End of Session   ?Activity Tolerance: Patient tolerated treatment well ?Patient left: in bed;with call bell/phone within reach;with family/visitor present ?Nurse Communication: Mobility status ?PT Visit Diagnosis: Hemiplegia and hemiparesis;Other abnormalities of gait and mobility (R26.89) ?Hemiplegia - Right/Left: Left ?Hemiplegia - dominant/non-dominant: Non-dominant ?Hemiplegia - caused by:  (brain mass) ?  ? ?Time: 2694-8546 ?PT Time Calculation (min) (ACUTE ONLY): 51 min ? ? ?Charges:   PT Evaluation ?$PT Eval Moderate Complexity: 1 Mod ?  PT Treatments ?$Therapeutic Activity: 23-37 mins ?  ?   ? ? ?Wyona Almas, PT, DPT ?Acute Rehabilitation Services ?Pager [phone] ?Office [phone] ? ? ?Deno Etienne ?02/28/2022, 4:35 PM ? ?

## 2022-02-28 NOTE — Progress Notes (Signed)
Orthopedic Tech Progress Note ?Patient Details:  ?Patrick Buchanan ?[birth year] ?[id number] ? ?Ortho Devices ?Type of Ortho Device: Prafo boot/shoe ?Ortho Device/Splint Location: LLE ?Ortho Device/Splint Interventions: Ordered, Adjustment, Application ?  ?Post Interventions ?Patient Tolerated: Well ?Instructions Provided: Care of device ? ?Patrick Buchanan ?02/28/2022, 4:58 PM ? ?

## 2022-03-01 LAB — SURGICAL PATHOLOGY

## 2022-03-01 MED ORDER — MAGNESIUM HYDROXIDE 400 MG/5ML PO SUSP
30.0000 mL | Freq: Once | ORAL | Status: AC
  Administered 2022-03-01: 30 mL via ORAL
  Filled 2022-03-01: qty 30

## 2022-03-01 MED ORDER — MAGNESIUM HYDROXIDE 400 MG/5ML PO SUSP
30.0000 mL | Freq: Every day | ORAL | Status: DC | PRN
  Administered 2022-03-03: 30 mL via ORAL
  Filled 2022-03-01: qty 30

## 2022-03-01 MED ORDER — PANTOPRAZOLE SODIUM 40 MG PO TBEC
40.0000 mg | DELAYED_RELEASE_TABLET | Freq: Every day | ORAL | Status: DC
Start: 2022-03-01 — End: 2022-03-07
  Administered 2022-03-01 – 2022-03-06 (×6): 40 mg via ORAL
  Filled 2022-03-01 (×6): qty 1

## 2022-03-01 MED ORDER — LEVETIRACETAM 500 MG PO TABS
500.0000 mg | ORAL_TABLET | Freq: Two times a day (BID) | ORAL | Status: DC
  Administered 2022-03-01 – 2022-03-07 (×13): 500 mg via ORAL
  Filled 2022-03-01 (×13): qty 1

## 2022-03-01 NOTE — Progress Notes (Signed)
?  03/01/22 1545  ?Clinical Encounter Type  ?Visited With Patient;Health care provider  ?Visit Type Initial;Spiritual support  ?Referral From Nurse ?Kieth Brightly)  ?Consult/Referral To Chaplain ?Albertina Parr Bude)  ?Spiritual Encounters  ?Spiritual Needs Emotional;Prayer  ? ?Patient's primary nurse requested spiritual care consultation at patient's request. Met Mr. Patrick Buchanan at patient's bedside. [name] shared his faith heritage of being raised [employer], ?but transitioning to [facility] as an adult. [name] talked about his successful brain surgery and hopeful prognosis. At [name] request we prayed for strength and healing. ?[address] [place], [place]., [phone].  ?

## 2022-03-01 NOTE — PMR Pre-admission (Signed)
PMR Admission Coordinator Pre-Admission Assessment ? ?Patient: Patrick Buchanan is an 71 y.o., male ?MRN: [id number] ?DOB: Feb 22, 1951 ?Height: 6' (182.9 cm) ?Weight: 98.9 kg ? ?Insurance Information ?HMO: yes    PPO:      PCP:      IPA:      80/20:      OTHER:  ?PRIMARY: [employer]      Policy#: [id number]      Subscriber: pt ?CM Name: Myriam Jacobson      Phone#: [phone] ext [phone]     Fax#: [phone] ?Pre-Cert#: [id number]  approved for 7 days     Employer:  ?Benefits:  Phone #: [phone]     Name: 5/10 ?Eff. Date: 10/23/21     Deduct: none      Out of Pocket Max: $3400      Life Max: none ?CIR: $295 co pay per day days 1 until 6      SNF: no copay per day days 1 until 20; $196 co pay per day days 21 until 100 ?Outpatient: $10 to $20 per visit     Co-Pay: visits per medical neccesity ?Home Health: 100%      Co-Pay: visits per medical neccesity ?DME: 80%     Co-Pay: 20% ?Providers: in network ? ?SECONDARY: none       ? ?Financial Counselor:       Phone#:  ? ?The ?Data Collection Information Summary? for patients in Inpatient Rehabilitation Facilities with attached ?Privacy Act [place] Records? was provided and verbally reviewed with: Patient and Family ? ?Emergency Contact Information ?Contact Information   ? ? Name Relation Home Work Mobile  ? [name] Daughter   [phone]  ? Galeno,Katie Daughter   [phone]  ? ?  ? ?Current Medical History  ?Patient Admitting Diagnosis: meningioma resection ? ?History of Present Illness: 71 year old male with history of  DM, GERD, aneurysm clipping 1992 and benign cervical subdural tumor resection 2005. Progressive left sided weakness. CT scan demonstrated evidence of enhancing masses first in his parasagittal region with marked associated mass effect. Tumor approaching 6 cm. He has secondary dural based tumors the first in the left posterior temporal region and left sylvian region also consistent with meningiomas. Presented on 02/27/22 for  resection of the large symptomatic tumor with no plans of the other 2 intracranial tumors at present time. ? ?Postoperatively with 5/5 motor exam to the right and LUE with improved movement to left hand but no voluntary movement of his proximal left upper extremity. Beginning to have some minimal triceps and wrist extensor function. LLE with spastic weakness and no voluntary movement. Follow up CT scan demonstrates good appearance of his resection with out evidence of obvious complicating features. Placed on steroid taper. ? ?Patient's medical record from [facility] has been reviewed by the rehabilitation admission coordinator and physician. ? ?Past Medical History  ?Past Medical History:  ?Diagnosis Date  ? Allergy   ? hay fever  ? Blood transfusion without reported diagnosis   ? Cerebral aneurysm 1992  ? "leaking"; treated by Dr Sherwood Gambler  ? Depression   ? Diabetes mellitus without complication ([place])   ? type 2  ? GERD (gastroesophageal reflux disease)   ? past hx   ? ?Has the patient had major surgery during 100 days prior to admission? Yes ? ?Family History   ?family history includes Breast cancer in his mother; Diabetes in his brother. ? ?Current Medications ? ?Current Facility-Administered Medications:  ?  0.9 %  sodium chloride infusion (Manually program via Guardrails IV Fluids), , Intravenous, Once, Earnie Larsson, MD ?  acetaminophen (TYLENOL) tablet 650 mg, 650 mg, Oral, Q4H PRN **OR** acetaminophen (TYLENOL) suppository 650 mg, 650 mg, Rectal, Q4H PRN, Earnie Larsson, MD ?  bisacodyl (DULCOLAX) suppository 10 mg, 10 mg, Rectal, Daily PRN, Earnie Larsson, MD, 10 mg at 03/03/22 1343 ?  calcium carbonate (TUMS - dosed in mg elemental calcium) chewable tablet 200 mg of elemental calcium, 1 tablet, Oral, TID WC, Pool, Henry, MD, 200 mg of elemental calcium at 03/07/22 0915 ?  Chlorhexidine Gluconate Cloth 2 % PADS 6 each, 6 each, Topical, Daily, Earnie Larsson, MD, 6 each at 03/07/22 [phone] ?  cholecalciferol  (VITAMIN D3) tablet 5,000 Units, 5,000 Units, Oral, Daily, Earnie Larsson, MD, 5,000 Units at 03/07/22 0915 ?  [COMPLETED] dexamethasone (DECADRON) injection 6 mg, 6 mg, Intravenous, Q6H, 6 mg at 02/28/22 0627 **FOLLOWED BY** [COMPLETED] dexamethasone (DECADRON) injection 4 mg, 4 mg, Intravenous, Q6H, 4 mg at 03/01/22 0643 **FOLLOWED BY** dexamethasone (DECADRON) injection 2 mg, 2 mg, Intravenous, Q6H, Eustace Moore, MD, 2 mg at 03/07/22 0545 ?  diphenhydrAMINE (BENADRYL) capsule 25 mg, 25 mg, Oral, Q6H, Earnie Larsson, MD, 25 mg at 03/07/22 0545 ?  docusate sodium (COLACE) capsule 100 mg, 100 mg, Oral, BID, Bergman, Meghan D, NP, 100 mg at 03/07/22 2778 ?  escitalopram (LEXAPRO) tablet 20 mg, 20 mg, Oral, Daily, Pool, Henry, MD, 20 mg at 03/06/22 2120 ?  famotidine (PEPCID) tablet 10 mg, 10 mg, Oral, Daily, Pool, Mallie Mussel, MD, 10 mg at 03/07/22 0915 ?  gabapentin (NEURONTIN) capsule 100 mg, 100 mg, Oral, BID, Earnie Larsson, MD, 100 mg at 03/07/22 0914 ?  HYDROcodone-acetaminophen (NORCO/VICODIN) 5-325 MG per tablet 1 tablet, 1 tablet, Oral, Q4H PRN, Earnie Larsson, MD, 1 tablet at 03/07/22 0916 ?  HYDROmorphone (DILAUDID) injection 0.5-1 mg, 0.5-1 mg, Intravenous, Q2H PRN, Earnie Larsson, MD, 1 mg at 03/01/22 2140 ?  labetalol (NORMODYNE) injection 10-40 mg, 10-40 mg, Intravenous, Q10 min PRN, Earnie Larsson, MD ?  lamoTRIgine (LAMICTAL) tablet 100 mg, 100 mg, Oral, BID, Earnie Larsson, MD, 100 mg at 03/07/22 0915 ?  levETIRAcetam (KEPPRA) tablet 500 mg, 500 mg, Oral, BID, Pool, Mallie Mussel, MD, 500 mg at 03/07/22 0915 ?  magnesium hydroxide (MILK OF MAGNESIA) suspension 30 mL, 30 mL, Oral, Daily PRN, Earnie Larsson, MD, 30 mL at 03/03/22 1012 ?  multivitamin with minerals tablet 1 tablet, 1 tablet, Oral, Daily, Earnie Larsson, MD, 1 tablet at 03/07/22 2423 ?  naloxone (NARCAN) injection 0.08 mg, 0.08 mg, Intravenous, PRN, Earnie Larsson, MD ?  naphazoline-pheniramine (NAPHCON-A) 0.025-0.3 % ophthalmic solution 1 drop, 1 drop, Both Eyes, QID PRN,  Earnie Larsson, MD ?  ondansetron (ZOFRAN) tablet 4 mg, 4 mg, Oral, Q4H PRN **OR** ondansetron (ZOFRAN) injection 4 mg, 4 mg, Intravenous, Q4H PRN, Earnie Larsson, MD ?  pantoprazole (PROTONIX) EC tablet 40 mg, 40 mg, Oral, QHS, Earnie Larsson, MD, 40 mg at 03/06/22 2120 ?  polyethylene glycol (MIRALAX / GLYCOLAX) packet 17 g, 17 g, Oral, BID, Bergman, Meghan D, NP, 17 g at 03/07/22 0916 ?  polyvinyl alcohol (LIQUIFILM TEARS) 1.4 % ophthalmic solution 1 drop, 1 drop, Both Eyes, PRN, Earnie Larsson, MD, 1 drop at 03/01/22 2128 ?  promethazine (PHENERGAN) tablet 12.5-25 mg, 12.5-25 mg, Oral, Q4H PRN, Earnie Larsson, MD, 25 mg at 03/06/22 0600 ?  rosuvastatin (CRESTOR) tablet 20 mg, 20 mg, Oral, Daily, Pool, Mallie Mussel, MD, 20 mg at 03/07/22 0915 ?  simethicone (MYLICON)  chewable tablet 80 mg, 80 mg, Oral, QID PRN, Consuella Lose, MD, 80 mg at 03/06/22 1030 ?  sodium phosphate (FLEET) 7-19 GM/118ML enema 1 enema, 1 enema, Rectal, Daily PRN, Earnie Larsson, MD, 1 enema at 03/03/22 1552 ?  Vitamin D (Ergocalciferol) (DRISDOL) capsule 50,000 Units, 50,000 Units, Oral, Illene Silver, MD, 50,000 Units at 03/05/22 0831 ? ?Patients Current Diet:  ?Diet Order   ? ?       ?  Diet regular Room service appropriate? Yes with Assist; Fluid consistency: Thin  Diet effective now       ?  ? ?  ?  ? ?  ? ?Precautions / Restrictions ?Precautions ?Precautions: Fall, Other (comment) ?Precaution Comments: L hemi ?Restrictions ?Weight Bearing Restrictions: No  ? ?Has the patient had 2 or more falls or a fall with injury in the past year? No ? ?Prior Activity Level ?Community (5-7x/wk): independent, retired and driving ? ?Prior Functional Level ?Self Care: Did the patient need help bathing, dressing, using the toilet or eating? Independent ? ?Indoor Mobility: Did the patient need assistance with walking from room to room (with or without device)? Independent ? ?Stairs: Did the patient need assistance with internal or external stairs (with or without  device)? Independent ? ?Functional Cognition: Did the patient need help planning regular tasks such as shopping or remembering to take medications? Independent ? ?Patient Information ?Are you of Hispanic, Latino/a,or Span

## 2022-03-01 NOTE — Evaluation (Addendum)
Occupational Therapy Evaluation Patient Details Name: Patrick Buchanan MRN: [id number] DOB: 07/21/1951 Today's Date: 03/01/2022   History of Present Illness Pt is a 71 y.o. 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 Significant PMH: cerebral aneurysm, DM.   Clinical Impression   Patrick Buchanan was evaluated s/p the above admission list, he is generally mod I at baseline with use of AD when needed, sponge bathes due to feeling unsteady for tub shower transfer, lives alone and drives. His daughter plans to provide 24/7 as needed at d/c. Upon evaluation pt required max A +2 for bed mobility, 3x sit<>stand transfers and stand pivot from bed>chair. Due to L hemiplegia, poor sitting and standing balance and impaired attention he requires min-mod A for all UB ADLs, and max A +2 for LB ADLs. Pt will benefit from OT acutely to address the limitations listed below. Recommend multidisciplinary approach to intensive rehab at the AIR level for maximal functional recovery.   Pt will also benefit from L wrist cock up splint for joint integrity as pt does not have wrist extensor activation and increased flexor tone noted this date. OT to place orders and wear/care schedule.   Pt will also benefit from SLP cognitive evaluation as pt demonstrated impaired areas of attention, orientation, problem solving and awareness.       Recommendations for follow up therapy are one component of a multi-disciplinary discharge planning process, led by the attending physician.  Recommendations may be updated based on patient status, additional functional criteria and insurance authorization.   Follow Up Recommendations  Acute inpatient rehab (3hours/day)    Assistance Recommended at Discharge Frequent or constant Supervision/Assistance  Patient can return home with the following Two people to help with  walking and/or transfers;Two people to help with bathing/dressing/bathroom;Help with stairs or ramp for entrance;Assist for transportation;Assistance with cooking/housework    Functional Status Assessment  Patient has had a recent decline in their functional status and demonstrates the ability to make significant improvements in function in a reasonable and predictable amount of time.  Equipment Recommendations  Other (comment) (pending progerss)    Recommendations for Other Services Rehab consult;Speech consult     Precautions / Restrictions Precautions Precautions: Fall;Other (comment) Precaution Comments: L hemi Restrictions Weight Bearing Restrictions: No      Mobility Bed Mobility Overal bed mobility: Needs Assistance Bed Mobility: Supine to Sit     Supine to sit: Max assist, +2 for physical assistance     General bed mobility comments: pt utilizing RLE to hook under LLE, assist to elevate trunk from OT, pt is able to initiate with UE support of PT to pull up with RUE    Transfers Overall transfer level: Needs assistance Equipment used: 2 person hand held assist Transfers: Sit to/from Stand, Bed to chair/wheelchair/BSC Sit to Stand: Max assist, +2 physical assistance Stand pivot transfers: Max assist, +2 physical assistance         General transfer comment: pt stands 3 times with L knee block, L side into extension with LLE external rotation and R lateral trunk lean. Pt stands 3 times and transfers once. PT provides cues for foot placement.      Balance Overall balance assessment: Needs assistance Sitting-balance support: Single extremity supported, Feet supported Sitting balance-Leahy Scale: Poor Sitting balance - Comments: minA-maxA, pt is aware of balance deficits when attending but when conversing pt requires maxA with losses  of balance to left side Postural control: Left lateral lean Standing balance support: Bilateral upper extremity supported Standing  balance-Leahy Scale: Poor Standing balance comment: maxA x2, knee block                           ADL either performed or assessed with clinical judgement   ADL Overall ADL's : Needs assistance/impaired Eating/Feeding: Sitting;Minimal assistance Eating/Feeding Details (indicate cue type and reason): supported in sitting Grooming: Minimal assistance;Sitting   Upper Body Bathing: Maximal assistance;Sitting   Lower Body Bathing: Maximal assistance;+2 for physical assistance;+2 for safety/equipment;Sit to/from stand   Upper Body Dressing : Moderate assistance;Sitting   Lower Body Dressing: Maximal assistance;+2 for physical assistance;+2 for safety/equipment;Sit to/from stand   Toilet Transfer: Maximal assistance;+2 for physical assistance;+2 for safety/equipment;Stand-pivot;BSC/3in1   Toileting- Clothing Manipulation and Hygiene: Maximal assistance;+2 for physical assistance;+2 for safety/equipment;Sit to/from stand       Functional mobility during ADLs: Maximal assistance;+2 for physical assistance;+2 for safety/equipment;Cueing for safety;Cueing for sequencing General ADL Comments: limited by L hemi, impaired balance, poor attention, problem solving and sequencing. Requires minG -max A for sitting balance with maximal cues and difficulty dual tasking with balance and talking.     Vision Baseline Vision/History: 1 Wears glasses Ability to See in Adequate Light: 0 Adequate Vision Assessment?: Vision impaired- to be further tested in functional context Additional Comments: needs further assessment - more attentive to the R environment. sustained midline during conversation, briefly attending to L with stimulation     Perception     Praxis      Pertinent Vitals/Pain Pain Assessment Pain Assessment: Faces Faces Pain Scale: Hurts a little bit Pain Location: L shoulder with bed mobility Pain Descriptors / Indicators: Discomfort, Grimacing Pain Intervention(s):  Monitored during session     Hand Dominance Right   Extremity/Trunk Assessment Upper Extremity Assessment Upper Extremity Assessment: RUE deficits/detail;LUE deficits/detail RUE Deficits / Details: WFL. LUE Deficits / Details: some active finger extension and wrist flexion noted. otherwise no tace activation or spontaneous movement. sensation is WFL. increased flexor tone throughout LUE Sensation: WNL LUE Coordination: decreased fine motor;decreased gross motor   Lower Extremity Assessment Lower Extremity Assessment: Defer to PT evaluation   Cervical / Trunk Assessment Cervical / Trunk Assessment: Kyphotic;Other exceptions Cervical / Trunk Exceptions: s/p crani   Communication Communication Communication: No difficulties   Cognition Arousal/Alertness: Awake/alert Behavior During Therapy: WFL for tasks assessed/performed Overall Cognitive Status: Impaired/Different from baseline Area of Impairment: Awareness, Attention                   Current Attention Level: Sustained       Awareness: Emergent   General Comments: Pt oriented to person, place and situation. Stated "April" as month and "Thursday" as day. Pt with poor attention to balance when attempting to talk - difficulty dual tasking. limits insight to deficits. tangentail. required cues for sequencing functional tasks     General Comments  VSS on RA, daughter present.    Exercises     Shoulder Instructions      Home Living Family/patient expects to be discharged to:: Private residence Living Arrangements: Alone Available Help at Discharge: Family Type of Home: House Home Access: Stairs to enter Secretary/administrator of Steps: 3 Entrance Stairs-Rails: Left Home Layout: Able to live on main level with bedroom/bathroom     Bathroom Shower/Tub: Chief Strategy Officer: Standard     Home Equipment: Cane - single point;Rolling  Walker (2 wheels)          Prior Functioning/Environment  Prior Level of Function : Independent/Modified Independent;Driving             Mobility Comments: no AD ADLs Comments: indep, reports difficulty with socks. drives. sponge bathes at baseline        OT Problem List: Decreased strength;Decreased range of motion;Decreased activity tolerance;Impaired balance (sitting and/or standing);Decreased cognition;Decreased coordination;Decreased safety awareness;Decreased knowledge of use of DME or AE;Impaired tone;Impaired UE functional use      OT Treatment/Interventions: Self-care/ADL training;Therapeutic exercise;DME and/or AE instruction;Therapeutic activities;Patient/family education;Balance training;Cognitive remediation/compensation;Neuromuscular education    OT Goals(Current goals can be found in the care plan section) Acute Rehab OT Goals Patient Stated Goal: more movement OT Goal Formulation: With patient Time For Goal Achievement: 03/15/22 Potential to Achieve Goals: Good ADL Goals Pt Will Perform Upper Body Dressing: with min assist;sitting Pt Will Perform Lower Body Dressing: with mod assist;sit to/from stand Pt Will Transfer to Toilet: with mod assist;stand pivot transfer;bedside commode Pt Will Perform Toileting - Clothing Manipulation and hygiene: with min assist;sitting/lateral leans Additional ADL Goal #1: Pt will complete bed mobilty with min A as a precursor to ADLs Additional ADL Goal #2: Pt will demonstrate indep ability to dual task while sitting unsupported as a precursor to ADLs  OT Frequency: Min 2X/week    Co-evaluation PT/OT/SLP Co-Evaluation/Treatment: Yes Reason for Co-Treatment: Complexity of the patient's impairments (multi-system involvement);For patient/therapist safety;To address functional/ADL transfers PT goals addressed during session: Mobility/safety with mobility;Balance;Strengthening/ROM OT goals addressed during session: ADL's and self-care      AM-PAC OT "6 Clicks" Daily Activity     Outcome  Measure Help from another person eating meals?: A Little Help from another person taking care of personal grooming?: A Lot Help from another person toileting, which includes using toliet, bedpan, or urinal?: A Lot Help from another person bathing (including washing, rinsing, drying)?: A Lot Help from another person to put on and taking off regular upper body clothing?: A Lot Help from another person to put on and taking off regular lower body clothing?: A Lot 6 Click Score: 13   End of Session Equipment Utilized During Treatment: Gait belt Nurse Communication: Mobility status (lift pad under pt)  Activity Tolerance: Patient tolerated treatment well Patient left: in chair;with call bell/phone within reach;with chair alarm set  OT Visit Diagnosis: Repeated falls (R29.6);Unsteadiness on feet (R26.81);Other abnormalities of gait and mobility (R26.89);Muscle weakness (generalized) (M62.81);Hemiplegia and hemiparesis Hemiplegia - Right/Left: Left Hemiplegia - dominant/non-dominant: Non-Dominant Hemiplegia - caused by: Other cerebrovascular disease                Time: 0832-0920 OT Time Calculation (min): 48 min Charges:  OT General Charges $OT Visit: 1 Visit OT Evaluation $OT Eval Moderate Complexity: 1 Mod OT Treatments $Therapeutic Activity: 8-22 mins   Brannan Cassedy A Aasir Daigler 03/01/2022, 10:32 AM

## 2022-03-01 NOTE — Progress Notes (Signed)
Overall stable.  No new issues or problems.  Minimal incisional pain.  Patient does feel that he is using his left hand better.  Still reports no movement of his left lower extremity. ? ?Afebrile.  Vital signs are stable.  Awake and alert.  Oriented and appropriate.  Speech fluent.  Judgment insight intact.  Cranial nerve function normal by.  Motor examination with good grip and intrinsic function in his left hand.  Beginning to have some minimal triceps and wrist extensor function.  Nothing more proximal.  Lower extremity continues to have increased tone with no voluntary movement on the left side.  Right lower extremity normal.  Wound clean and dry. ? ?Overall progressing well.  Continue supportive efforts.  Continue steroid wean.  Patient should be an excellent candidate for inpatient rehab and will be ready as soon as they have a bed available. ?

## 2022-03-01 NOTE — Progress Notes (Signed)
Physical Therapy Treatment ?Patient Details ?Name: Patrick Buchanan ?MRN: [id number] ?DOB: 1950-10-27 ?Today's Date: 03/01/2022 ? ? ?History of Present Illness Pt is a 71 y.o. 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 Significant PMH: cerebral aneurysm, DM. ? ?  ?PT Comments  ? ? Pt tolerates treatment well, standing multiple times and transferring out of bed. Pt demonstrates increased LLE extensor tone, as well as L extension through trunk with standing attempts. Pt is at a high falls risk due to L weakness and imbalance. Pt will benefit from continued acute PT services in an effort to reduce falls risk and in an effort to restore the patient's prior level of function. PT continues to recommend AIR.   ?Recommendations for follow up therapy are one component of a multi-disciplinary discharge planning process, led by the attending physician.  Recommendations may be updated based on patient status, additional functional criteria and insurance authorization. ? ?Follow Up Recommendations ? Acute inpatient rehab (3hours/day) ?  ?  ?Assistance Recommended at Discharge Frequent or constant Supervision/Assistance  ?Patient can return home with the following Two people to help with walking and/or transfers;Two people to help with bathing/dressing/bathroom ?  ?Equipment Recommendations ? Wheelchair (measurements PT);Wheelchair cushion (measurements PT);BSC/3in1  ?  ?Recommendations for Other Services Rehab consult ? ? ?  ?Precautions / Restrictions Precautions ?Precautions: Fall;Other (comment) ?Precaution Comments: L hemi ?Restrictions ?Weight Bearing Restrictions: No  ?  ? ?Mobility ? Bed Mobility ?Overal bed mobility: Needs Assistance ?Bed Mobility: Supine to Sit ?  ?  ?Supine to sit: Max assist, +2 for physical assistance ?  ?  ?General bed mobility comments: pt utilizing RLE to hook under  LLE, assist to elevate trunk from OT, pt is able to initiate with UE support of PT to pull up with RUE ?  ? ?Transfers ?Overall transfer level: Needs assistance ?Equipment used: 2 person hand held assist ?Transfers: Sit to/from Stand, Bed to chair/wheelchair/BSC ?Sit to Stand: Max assist, +2 physical assistance ?Stand pivot transfers: Max assist, +2 physical assistance ?  ?  ?  ?  ?General transfer comment: pt stands 3 times with L knee block, L side into extension with LLE external rotation and R lateral trunk lean. Pt stands 3 times and transfers once. PT provides cues for foot placement. ?  ? ?Ambulation/Gait ?  ?  ?  ?  ?  ?  ?  ?  ? ? ?Stairs ?  ?  ?  ?  ?  ? ? ?Wheelchair Mobility ?  ? ?Modified Rankin (Stroke Patients Only) ?Modified Rankin (Stroke Patients Only) ?Pre-Morbid Rankin Score: No symptoms ?Modified Rankin: Severe disability ? ? ?  ?Balance Overall balance assessment: Needs assistance ?Sitting-balance support: Single extremity supported, Feet supported ?Sitting balance-Leahy Scale: Poor ?Sitting balance - Comments: minA-maxA, pt is aware of balance deficits when attending but when conversing pt requires maxA with losses of balance to left side ?Postural control: Left lateral lean ?Standing balance support: Bilateral upper extremity supported ?Standing balance-Leahy Scale: Poor ?Standing balance comment: maxA x2, knee block ?  ?  ?  ?  ?  ?  ?  ?  ?  ?  ?  ?  ? ?  ?Cognition Arousal/Alertness: Awake/alert ?Behavior During Therapy: [facility] for tasks assessed/performed ?Overall Cognitive Status: Impaired/Different from baseline ?Area of Impairment: Awareness, Attention ?  ?  ?  ?  ?  ?  ?  ?  ?  ?  Current Attention Level: Sustained ?  ?  ?  ?Awareness: Emergent ?  ?General Comments: pt with poor attention to balance when attempting to talk ?  ?  ? ?  ?Exercises   ? ?  ?General Comments General comments (skin integrity, edema, etc.): VSS on RA ?  ?  ? ?Pertinent Vitals/Pain Pain Assessment ?Pain Assessment:  No/denies pain  ? ? ?Home Living   ?  ?  ?  ?  ?  ?  ?  ?  ?  ?   ?  ?Prior Function    ?  ?  ?   ? ?PT Goals (current goals can now be found in the care plan section) Acute Rehab PT Goals ?Patient Stated Goal: for improved left sided strength ?Progress towards PT goals: Progressing toward goals ? ?  ?Frequency ? ? ? Min 4X/week ? ? ? ?  ?PT Plan Current plan remains appropriate  ? ? ?Co-evaluation PT/OT/SLP Co-Evaluation/Treatment: Yes ?Reason for Co-Treatment: Complexity of the patient's impairments (multi-system involvement);For patient/therapist safety;To address functional/ADL transfers ?PT goals addressed during session: Mobility/safety with mobility;Balance;Strengthening/ROM ?  ?  ? ?  ?AM-PAC PT "6 Clicks" Mobility   ?Outcome Measure ? Help needed turning from your back to your side while in a flat bed without using bedrails?: A Lot ?Help needed moving from lying on your back to sitting on the side of a flat bed without using bedrails?: Total ?Help needed moving to and from a bed to a chair (including a wheelchair)?: Total ?Help needed standing up from a chair using your arms (e.g., wheelchair or bedside chair)?: Total ?Help needed to walk in hospital room?: Total ?Help needed climbing 3-5 steps with a railing? : Total ?6 Click Score: 7 ? ?  ?End of Session Equipment Utilized During Treatment: Gait belt ?Activity Tolerance: Patient tolerated treatment well ?Patient left: in chair;with call bell/phone within reach;with chair alarm set;with family/visitor present ?Nurse Communication: Mobility status;Need for lift equipment ?PT Visit Diagnosis: Hemiplegia and hemiparesis;Other abnormalities of gait and mobility (R26.89) ?Hemiplegia - Right/Left: Left ?Hemiplegia - dominant/non-dominant: Non-dominant ?Hemiplegia - caused by:  (brain mass resection) ?  ? ? ?Time: 5056-9794 ?PT Time Calculation (min) (ACUTE ONLY): 42 min ? ?Charges:  $Therapeutic Activity: 23-37 mins          ?          ? ?Zenaida Niece, PT,  DPT ?Acute Rehabilitation ?Pager: [phone] ?Office [phone] ? ? ? ?Zenaida Niece ?03/01/2022, 9:32 AM ? ?

## 2022-03-01 NOTE — Progress Notes (Signed)
OT impression: Splint check completed with session with distal LUE exercises. Wrist cockup splint donned ~1.5 hours prior to check, therapist removed pre-fab splint with no skin integrity impairments noted. Pt denied any pain/discomfort while in the splint. Good tolerance to exercises listed below. Therapist placed pt back into the splint, pt verbalized great understanding of wear schedule and instruction paper placed on back wall for care team to utilize. OT to continue to follow. D/c remains appropriate.  ? ? ? 03/01/22 1600  ?OT Visit Information  ?Last OT Received On 03/01/22  ?Assistance Needed +2  ?History of Present Illness Pt is a 71 y.o. 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 Significant PMH: cerebral aneurysm, DM.  ?Precautions  ?Precautions Fall;Other (comment)  ?Precaution Comments L hemi  ?Restrictions  ?Weight Bearing Restrictions No  ?Pain Assessment  ?Pain Assessment Faces  ?Faces Pain Scale 2  ?Pain Location L wrist with PROM  ?Pain Descriptors / Indicators Discomfort;Grimacing  ?Pain Intervention(s) Monitored during session;Limited activity within patient's tolerance  ?Cognition  ?Arousal/Alertness Awake/alert  ?Behavior During Therapy [facility] for tasks assessed/performed  ?Overall Cognitive Status Impaired/Different from baseline  ?Area of Impairment Awareness;Attention  ?Current Attention Level Sustained  ?Following Commands Follows multi-step commands inconsistently  ?Safety/Judgement Decreased awareness of safety;Decreased awareness of deficits  ?Awareness Emergent  ?Problem Solving Requires verbal cues  ?General Comments Good recall of splint wear schedule  ?Upper Extremity Assessment  ?Upper Extremity Assessment LUE deficits/detail  ?LUE Deficits / Details some active finger extension and wrist flexion noted. otherwise no tace activation or spontaneous movement.  sensation is WFL. increased flexor tone throughout  ?LUE Sensation WNL  ?LUE Coordination decreased fine motor;decreased gross motor  ?General Comments  ?General comments (skin integrity, edema, etc.) VSS on RA  ?Exercises  ?Exercises Other exercises  ?Other Exercises  ?Other Exercises PROM L wrist extension with 5 second stretching hold x10  ?Other Exercises PROM L pronation and supination x5 each  ?Other Exercises AAROM L composite flexion x10  ?OT - End of Session  ?Activity Tolerance Patient tolerated treatment well  ?Patient left in bed;with call bell/phone within reach;with bed alarm set  ?Nurse Communication Mobility status  ?OT Assessment/Plan  ?OT Plan Discharge plan remains appropriate  ?OT Visit Diagnosis Repeated falls (R29.6);Unsteadiness on feet (R26.81);Other abnormalities of gait and mobility (R26.89);Muscle weakness (generalized) (M62.81);Hemiplegia and hemiparesis  ?Hemiplegia - Right/Left Left  ?Hemiplegia - dominant/non-dominant Non-Dominant  ?Hemiplegia - caused by Other cerebrovascular disease  ?OT Frequency (ACUTE ONLY) Min 2X/week  ?Recommendations for Other Services Rehab consult;Speech consult  ?Follow Up Recommendations Acute inpatient rehab (3hours/day)  ?Assistance recommended at discharge Frequent or constant Supervision/Assistance  ?Patient can return home with the following Two people to help with walking and/or transfers;Two people to help with bathing/dressing/bathroom;Help with stairs or ramp for entrance;Assist for transportation;Assistance with cooking/housework  ?OT Equipment Other (comment)  ?AM-PAC OT "6 Clicks" Daily Activity Outcome Measure (Version 2)  ?Help from another person eating meals? 3  ?Help from another person taking care of personal grooming? 2  ?Help from another person toileting, which includes using toliet, bedpan, or urinal? 2  ?Help from another person bathing (including washing, rinsing, drying)? 2  ?Help from another person to put on and taking off regular  upper body clothing? 2  ?Help from another person to put on and taking off regular lower body clothing? 2  ?6 Click Score 13  ?  Progressive Mobility  ?What is the highest level of mobility based on the progressive mobility assessment? Level 1 (Bedfast) - Unable to balance while sitting on edge of bed  ?Activity Turned to back - supine  ?OT Goal Progression  ?Progress towards OT goals Progressing toward goals  ?Acute Rehab OT Goals  ?OT Goal Formulation With patient  ?Time For Goal Achievement 03/15/22  ?Potential to Achieve Goals Good  ?ADL Goals  ?Pt Will Perform Upper Body Dressing with min assist;sitting  ?Pt Will Perform Lower Body Dressing with mod assist;sit to/from stand  ?Pt Will Transfer to Toilet with mod assist;stand pivot transfer;bedside commode  ?Pt Will Perform Toileting - Clothing Manipulation and hygiene with min assist;sitting/lateral leans  ?Additional ADL Goal #1 Pt will complete bed mobilty with min A as a precursor to ADLs  ?Additional ADL Goal #2 Pt will demonstrate indep ability to dual task while sitting unsupported as a precursor to ADLs  ?OT Time Calculation  ?OT Start Time (ACUTE ONLY) 1550  ?OT Stop Time (ACUTE ONLY) 1605  ?OT Time Calculation (min) 15 min  ?OT General Charges  ?$OT Visit 1 Visit  ?OT Treatments  ?$Orthotics/Prosthetics Check 8-22 mins  ? ?

## 2022-03-01 NOTE — Progress Notes (Signed)
Inpatient Rehabilitation Admissions Coordinator  ? ?I met with patient and his daughter, Crystal at bedside. We discussed goals and expectations of a possible CIR admit.  He lives alone, but daughters to discuss caregiver supports as well as he as a Long term care policy to pay for care in his home with [facility]. I will begin insurance Auth with [employer] for CIR admit. ? ? , RN, MSN ?Rehab Admissions Coordinator ?[phone] ?03/01/2022 12:01 PM ? ?

## 2022-03-01 NOTE — Progress Notes (Signed)
Orthopedic Tech Progress Note ?Patient Details:  ?EVIAN SALGUERO ?05-04-1951 ?[id number] ? ?Ortho Devices ?Type of Ortho Device: Velcro wrist splint ?Ortho Device/Splint Location: LUE ?Ortho Device/Splint Interventions: Ordered, Application, Adjustment ?  ?Post Interventions ?Patient Tolerated: Well ?Instructions Provided: Care of device ? ?Janit Pagan ?03/01/2022, 3:16 PM ? ?

## 2022-03-01 NOTE — Evaluation (Signed)
Speech Language Pathology Evaluation ?Patient Details ?Name: SABEN DONIGAN ?MRN: [id number] ?DOB: December 23, 1950 ?Today's Date: 03/01/2022 ?Time:  -  ?  ? ?Problem List:  ?Patient Active Problem List  ? Diagnosis Date Noted  ? Brain tumor ([place]) 02/27/2022  ? Meningioma ([place]) 02/27/2022  ? Left hemiparesis ([place]) 01/06/2022  ? History of cerebral aneurysm 01/06/2022  ? Gait abnormality 01/06/2022  ? Mixed diabetic hyperlipidemia associated with type 2 diabetes mellitus ([place]) 11/18/2019  ? Diabetes mellitus ([place]) 11/18/2019  ? Counseling on health promotion and disease prevention 11/18/2019  ? History of smoking for 6-10 years 10/29/2019  ? Pain in right knee 08/25/2019  ? Vitamin D insufficiency 08/11/2019  ? Hypertriglyceridemia 08/11/2019  ? Prediabetes 08/11/2019  ? Serum sodium elevated 08/11/2019  ? dysthymia 10/28/2018  ? (BMI 30.0-34.9) 10/28/2018  ? Elevated blood pressure reading- no dx HTN 10/28/2018  ? Family history of diabetes mellitus (DM) 10/28/2018  ? ?Past Medical History:  ?Past Medical History:  ?Diagnosis Date  ? Allergy   ? hay fever  ? Blood transfusion without reported diagnosis   ? Cerebral aneurysm 1992  ? "leaking"; treated by Dr Sherwood Gambler  ? Depression   ? Diabetes mellitus without complication ([place])   ? type 2  ? GERD (gastroesophageal reflux disease)   ? past hx   ? ?Past Surgical History:  ?Past Surgical History:  ?Procedure Laterality Date  ? APPENDECTOMY    ? CEREBRAL ANEURYSM REPAIR  1992  ? clipping by Dr Sherwood Gambler; cannot have an MRI  ? CRANIOTOMY Bilateral 02/27/2022  ? Procedure: Craniotomy - bilateral - Parietal;  Surgeon: Earnie Larsson, MD;  Location: [facility];  Service: Neurosurgery;  Laterality: Bilateral;  ? HERNIA REPAIR N/A 1990  ? inguinal  ? INGUINAL HERNIA REPAIR    ? as a child and then another at 49 yrs old  ? TONSILLECTOMY    ? TUMOR EXCISION  2005  ? benign cervical  ? ?HPI:  ?71 year old male evaluated s/p bilateral lateral parietal craniotomy with resection of a large  parasagittal tumor. Pt initially presented with progressive left lower extremity and upper extremity weakness.  Work-up demonstrated evidence of severe multifocal disease consistent with multiple meningiomas.  Patient with a prior history of spinal meningioma status postresection patient also with history of aneurysm clipping with a not MRI compatible clip in place.  Patient's largest tumor is right parasagittal.  There is questionable invasion of the superior sagittal sinus.  Patient also with a left sphenoid wing meningioma and a left posterior temporal meningioma. Only the largest tumor was addressed in surgery.  ? ?Assessment / Plan / Recommendation ?Clinical Impression ? Pt demonstrates normal speech, language and auditory comprehension. He has mild dysphonia and mentioned this concern to SLP. Cognition in most aspects is in tact including short term memory, verbal problem solving and reasoning, and attention (but only to self directed tasks). However, pt is noted to be verbose and mildly disinhibited. Pt dominated conversation, provided little opportunity for turn taking and showed decreased emergent awareness of rules of social discourse. Pt was difficult to redirect and could not participate in therapist directed tasks. 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 SLP provided expectation for pt to participate in structured task tomorrow to further examine pts cognitive function s/p surgery. Recommend AIR at d/c. ?   ?SLP Assessment ? SLP Recommendation/Assessment: Patient needs continued [facility]  ?  ?Recommendations for follow  up therapy are one component of a multi-disciplinary discharge planning process, led by the attending physician.  Recommendations may be updated based on patient status, additional functional criteria and insurance authorization. ?   ?Follow Up Recommendations ? Acute inpatient rehab (3hours/day)   ?  ?Assistance Recommended at Discharge ?    ?Functional Status Assessment Patient has had a recent decline in their functional status and demonstrates the ability to make significant improvements in function in a reasonable and predictable amount of time.  ?Frequency and Duration min 2x/week  ?2 weeks ?  ?   ?SLP Evaluation ?Cognition ? Overall Cognitive Status: Impaired/Different from baseline ?Arousal/Alertness: Awake/alert ?Orientation Level: Oriented X4 ?Attention: Alternating;Divided ?Alternating Attention: Appears intact ?Divided Attention: Appears intact ?Memory: Appears intact ?Awareness: Impaired ?Awareness Impairment: Emergent impairment ?Problem Solving: Appears intact ?Executive Function: Self Monitoring ?Self Monitoring: Impaired ?Self Monitoring Impairment: Verbal complex ?Safety/Judgment: Appears intact  ?  ?   ?Comprehension ? Auditory Comprehension ?Overall Auditory Comprehension: Appears within functional limits for tasks assessed ?Reading Comprehension ?Reading Status: Not tested  ?  ?Expression Verbal Expression ?Overall Verbal Expression: Appears within functional limits for tasks assessed ?Written Expression ?Dominant Hand: Right ?Written Expression: Not tested   ?Oral / Motor ? Motor Speech ?Overall Motor Speech: Appears within functional limits for tasks assessed ?Phonation: Hoarse;Low vocal intensity (mildly hoarse)   ?        ? ?Shadawn Hanaway, Katherene Ponto ?03/01/2022, 2:44 PM ? ?

## 2022-03-02 ENCOUNTER — Encounter: Payer: Self-pay | Admitting: Neurology

## 2022-03-02 LAB — BASIC METABOLIC PANEL
Anion gap: 6 (ref 5–15)
BUN: 23 mg/dL (ref 8–23)
CO2: 26 mmol/L (ref 22–32)
Calcium: 8.5 mg/dL — ABNORMAL LOW (ref 8.9–10.3)
Chloride: 102 mmol/L (ref 98–111)
Creatinine, Ser: 0.83 mg/dL (ref 0.61–1.24)
GFR, Estimated: >60 mL/min (ref >60)
Glucose, Bld: 156 mg/dL — ABNORMAL HIGH (ref 70–99)
Potassium: 4.4 mmol/L (ref 3.5–5.1)
Sodium: 134 mmol/L — ABNORMAL LOW (ref 135–145)

## 2022-03-02 LAB — CBC
HCT: 30.2 % — ABNORMAL LOW (ref 39.0–52.0)
Hemoglobin: 10.3 g/dL — ABNORMAL LOW (ref 13.0–17.0)
MCH: 31.3 pg (ref 26.0–34.0)
MCHC: 34.1 g/dL (ref 30.0–36.0)
MCV: 91.8 fL (ref 80.0–100.0)
Platelets: 200 10*3/uL (ref 150–400)
RBC: 3.29 MIL/uL — ABNORMAL LOW (ref 4.22–5.81)
RDW: 13.6 % (ref 11.5–15.5)
WBC: 15 10*3/uL — ABNORMAL HIGH (ref 4.0–10.5)
nRBC: 0 % (ref 0.0–0.2)

## 2022-03-02 MED ORDER — LIDOCAINE HCL URETHRAL/MUCOSAL 2 % EX GEL
1.0000 "application " | Freq: Once | CUTANEOUS | Status: DC
  Filled 2022-03-02: qty 6

## 2022-03-02 NOTE — Telephone Encounter (Signed)
Please call patient, he had large right parasagittal extra axial brain mass resection on Feb 27, 2022,   ? ?It is expected to have some blood loss, will have lower hemoglobin level, ? ?Hemoglobin now is 10.3, decreased from his baseline of 12.9, ? ?May consider repeat laboratory evaluation later, ? ?Please check to see how is he recovering from his surgery ?

## 2022-03-02 NOTE — Progress Notes (Signed)
Inpatient Rehabilitation Admissions Coordinator  ? ?I await insurance approval for possible [facility] admit. ? ?Danne Baxter, RN, MSN ?Rehab Admissions Coordinator ?[phone] ?03/02/2022 3:02 PM ? ?

## 2022-03-02 NOTE — Progress Notes (Signed)
No issues or problems overnight.  Patient a little bit more discouraged today.  No headache. ? ?Afebrile.  Vital signs are stable.  Wound healing well.  Awake and alert.  Oriented and appropriate.  Motor examination unchanged.  Still with good left hand function but no significant wrist flexion or extension nor is there any elbow or shoulder movement.  He still has some spastic Paraschos in his left lower extremity.  Right side is normal.  Chest and abdomen benign. ? ?Overall stable following craniotomy and resection of large parasagittal tumor.  Continue supportive efforts.  Patient hopefully to rehab soon. ?

## 2022-03-02 NOTE — Progress Notes (Signed)
Physical Therapy Treatment ?Patient Details ?Name: Patrick Buchanan ?MRN: [id number] ?DOB: 05-19-1951 ?Today's Date: 03/02/2022 ? ? ?History of Present Illness Pt is a 71 y.o. 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 Significant PMH: cerebral aneurysm, DM. ? ?  ?PT Comments  ? ? Pt tolerates treatment well, performing multiple transfers during session. Pt follows cues for increased trunk lean and demonstrates awareness of balance deficits when not distracted by conversation. Pt does demonstrate fairly persistent left lateral lean and will benefit from continued acute PT services in an effort to reduce falls risk. LUE flaccid during session with some extensor tone noted in LLE but no volitional movement. PT continues to recommend AIR.   ?Recommendations for follow up therapy are one component of a multi-disciplinary discharge planning process, led by the attending physician.  Recommendations may be updated based on patient status, additional functional criteria and insurance authorization. ? ?Follow Up Recommendations ? Acute inpatient rehab (3hours/day) ?  ?  ?Assistance Recommended at Discharge Frequent or constant Supervision/Assistance  ?Patient can return home with the following Two people to help with walking and/or transfers;Two people to help with bathing/dressing/bathroom ?  ?Equipment Recommendations ? Wheelchair (measurements PT);Wheelchair cushion (measurements PT);BSC/3in1  ?  ?Recommendations for Other Services   ? ? ?  ?Precautions / Restrictions Precautions ?Precautions: Fall;Other (comment) ?Precaution Comments: L hemi ?Restrictions ?Weight Bearing Restrictions: No  ?  ? ?Mobility ? Bed Mobility ?Overal bed mobility: Needs Assistance ?Bed Mobility: Supine to Sit ?  ?  ?Supine to sit: Mod assist, HOB elevated ?  ?  ?General bed mobility comments: assistance for LLE and  trunk support as well as hand hold to rise into upright position ?  ? ?Transfers ?Overall transfer level: Needs assistance ?Equipment used: 1 person hand held assist ?Transfers: Sit to/from Stand, Bed to chair/wheelchair/BSC ?Sit to Stand: Mod assist, Max assist ?Stand pivot transfers: Max assist ?  ?  ?  ?  ?General transfer comment: modA sit to stand from elevated bed, maxA from lower recliner for 3 attempts. PT provides assistance with L knee block and BUE support. PT providing cues for sequencing of transfer. Verbal cues for increased anterior trunk lean. ?  ? ?Ambulation/Gait ?  ?  ?  ?  ?  ?  ?Pre-gait activities: lateral weight shifts in standing, pt utilizing UE support of RUE to produce shift. PT with knee block, pt tending to lean to left in standing ?  ? ? ?Stairs ?  ?  ?  ?  ?  ? ? ?Wheelchair Mobility ?  ? ?Modified Rankin (Stroke Patients Only) ?Modified Rankin (Stroke Patients Only) ?Pre-Morbid Rankin Score: No symptoms ?Modified Rankin: Severe disability ? ? ?  ?Balance Overall balance assessment: Needs assistance ?Sitting-balance support: Single extremity supported, Feet supported ?Sitting balance-Leahy Scale: Poor ?Sitting balance - Comments: min-maxA, left lateral lean ?Postural control: Left lateral lean ?Standing balance support: Bilateral upper extremity supported ?Standing balance-Leahy Scale: Poor ?Standing balance comment: maxA, knee block ?  ?  ?  ?  ?  ?  ?  ?  ?  ?  ?  ?  ? ?  ?Cognition Arousal/Alertness: Awake/alert ?Behavior During Therapy: [facility] for tasks assessed/performed ?Overall Cognitive Status: Impaired/Different from baseline ?Area of Impairment: Awareness, Attention ?  ?  ?  ?  ?  ?  ?  ?  ?  ?Current Attention Level: Sustained ?  ?  ?  ?  Awareness: Emergent ?  ?General Comments: continues to demonstrate impaired balance when distracted with conversation ?  ?  ? ?  ?Exercises   ? ?  ?General Comments General comments (skin integrity, edema, etc.): VSS on RA ?  ?  ? ?Pertinent  Vitals/Pain Pain Assessment ?Pain Assessment: No/denies pain  ? ? ?Home Living   ?  ?  ?  ?  ?  ?  ?  ?  ?  ?   ?  ?Prior Function    ?  ?  ?   ? ?PT Goals (current goals can now be found in the care plan section) Acute Rehab PT Goals ?Patient Stated Goal: for improved left sided strength ?Progress towards PT goals: Progressing toward goals ? ?  ?Frequency ? ? ? Min 4X/week ? ? ? ?  ?PT Plan Current plan remains appropriate  ? ? ?Co-evaluation   ?  ?  ?  ?  ? ?  ?AM-PAC PT "6 Clicks" Mobility   ?Outcome Measure ? Help needed turning from your back to your side while in a flat bed without using bedrails?: A Lot ?Help needed moving from lying on your back to sitting on the side of a flat bed without using bedrails?: A Lot ?Help needed moving to and from a bed to a chair (including a wheelchair)?: A Lot ?Help needed standing up from a chair using your arms (e.g., wheelchair or bedside chair)?: A Lot ?Help needed to walk in hospital room?: Total ?Help needed climbing 3-5 steps with a railing? : Total ?6 Click Score: 10 ? ?  ?End of Session   ?Activity Tolerance: Patient tolerated treatment well ?Patient left: in chair;with call bell/phone within reach;with family/visitor present;with chair alarm set ?Nurse Communication: Mobility status;Need for lift equipment ?PT Visit Diagnosis: Hemiplegia and hemiparesis;Other abnormalities of gait and mobility (R26.89) ?Hemiplegia - Right/Left: Left ?Hemiplegia - dominant/non-dominant: Non-dominant ?Hemiplegia - caused by:  (tumor resection) ?  ? ? ?Time: 3212-2482 ?PT Time Calculation (min) (ACUTE ONLY): 38 min ? ?Charges:  $Therapeutic Activity: 38-52 mins          ?          ? ?Zenaida Niece, PT, DPT ?Acute Rehabilitation ?Pager: [phone] ?Office [phone] ? ? ? ?Zenaida Niece ?03/02/2022, 10:26 AM ? ?

## 2022-03-02 NOTE — Progress Notes (Addendum)
Occupational Therapy Treatment ?Patient Details ?Name: Patrick Buchanan ?MRN: [id number] ?DOB: November 12, 1950 ?Today's Date: 03/02/2022 ? ? ?History of present illness Pt is a 71 y.o. 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 Significant PMH: cerebral aneurysm, DM. ?  ?OT comments ? Pt making steady progress. Focus of session on splint check and neuro rehab LUE and trunk,using intervention strategies as noted below. . Able to facilitate shoulder adduction with notable increase in tone. Midline postural control improved, however affected by inattention. Feel pt is an excellent AIR candidate, however will most likely need 24/7 physical assist with mobility and ADL after DC. Acute OT to continue ot follow.  ?Tolerating splint without problem; wear schedule posted in room.  ? ?Recommendations for follow up therapy are one component of a multi-disciplinary discharge planning process, led by the attending physician.  Recommendations may be updated based on patient status, additional functional criteria and insurance authorization. ?   ?Follow Up Recommendations ? Acute inpatient rehab (3hours/day)  ?  ?Assistance Recommended at Discharge Frequent or constant Supervision/Assistance  ?Patient can return home with the following ? Two people to help with walking and/or transfers;Two people to help with bathing/dressing/bathroom;Help with stairs or ramp for entrance;Assist for transportation;Assistance with cooking/housework;Direct supervision/assist for medications management;Direct supervision/assist for financial management ?  ?Equipment Recommendations ? Other (comment)  ?  ?Recommendations for Other Services Rehab consult;Speech consult ? ?  ?Precautions / Restrictions Precautions ?Precautions: Fall;Other (comment) ?Precaution Comments: L hemi ?Restrictions ?Weight Bearing Restrictions: No  ? ? ?   ? ?Mobility Bed Mobility ?  ?  ?  ?  ?  ?  ?  ?General bed mobility comments: OOB in recliner, leaning out of R side of recliner ?  ? ?Transfers ?  ?  ?  ?  ?  ?  ?  ?  ?  ?  ?  ?  ?Balance   ?  ?Sitting balance-Leahy Scale: Poor ?  ?  ?  ?  ?  ?  ?  ?  ?  ?  ?  ?  ?  ?  ?  ?  ?   ? ?ADL either performed or assessed with clinical judgement  ? ?ADL   ?  ?  ?  ?  ?  ?  ?  ?  ?  ?  ?  ?  ?  ?  ?  ?  ?  ?  ?  ?General ADL Comments: focus of session on LUE ?  ? ?Extremity/Trunk Assessment Upper Extremity Assessment ?LUE Deficits / Details: some active finger extension and wrist flexion noted. ablet o oppose thumb to 1-3 digits when wrist held at neutral; after facilitation, able to elicit shoulder adduction; increased tone ?LUE Coordination: decreased fine motor;decreased gross motor ?  ?  ?  ?  ?  ? ?Vision   ?Additional Comments: will further assess; decreased visual attention ?  ?Perception Perception ?Perception: Impaired (L inattention; difficulty maintaining attention to L during activity) ?  ?Praxis   ?  ? ?Cognition Arousal/Alertness: Awake/alert ?Behavior During Therapy: [facility] for tasks assessed/performed ?Overall Cognitive Status: Impaired/Different from baseline ?Area of Impairment: Attention, Awareness, Problem solving ?  ?  ?  ?  ?  ?  ?  ?  ?  ?Current Attention Level: Sustained ?  ?Following Commands: Follows one step commands consistently ?Safety/Judgement: Decreased awareness of safety, Decreased awareness of  deficits ?Awareness: Emergent ?Problem Solving: Slow processing, Difficulty sequencing, Decreased initiation ?General Comments: decreased attention affecting postural control ?  ?  ?   ?Exercises Other Exercises ?Other Exercises: trunk flexion/extension combined with weight shifts to facilitate midlein postural control; once shifted anteriorly, ableto lean forward and partially clear buttocks form chair; using over the back technique with L knee blocked to facilitate weight shift ?Other Exercises:  PROM L shoulder/elbow ?Other Exercises: facilitation of L shoulder adduction; trace elbow flexion after work on shoulder ?Other Exercises: PNF patterns used with RUE to work on forward weight shift adn trunk rotation in sitting with pt reaching toward floor, then up with RUE ? ?  ?Shoulder Instructions   ? ? ?  ?General Comments leaning out of R side of recliner; footrest put down and facilitated forward scoot to reposition self in recliner  ? ? ?Pertinent Vitals/ Pain       Pain Assessment ?Pain Assessment: No/denies pain ? ?Home Living   ?  ?  ?  ?  ?  ?  ?  ?  ?  ?  ?  ?  ?  ?  ?  ?  ?  ?  ? ?  ?Prior Functioning/Environment    ?  ?  ?  ?   ? ?Frequency ? Min 2X/week  ? ? ? ? ?  ?Progress Toward Goals ? ?OT Goals(current goals can now be found in the care plan section) ? Progress towards OT goals: Progressing toward goals (goals added) ? ?Acute Rehab OT Goals ?Patient Stated Goal: to get better ?OT Goal Formulation: With patient ?Time For Goal Achievement: 03/15/22 ?Potential to Achieve Goals: Good ?ADL Goals ?Pt Will Perform Upper Body Dressing: with min assist;sitting ?Pt Will Perform Lower Body Dressing: with mod assist;sit to/from stand ?Pt Will Transfer to Toilet: with mod assist;stand pivot transfer;bedside commode ?Pt Will Perform Toileting - Clothing Manipulation and hygiene: with min assist;sitting/lateral leans ?Pt/caregiver will Perform Home Exercise Program: Left upper extremity;With minimal assist;Increased ROM;Increased strength ?Additional ADL Goal #1: Pt will complete bed mobilty with min A as a precursor to ADLs ?Additional ADL Goal #2: Pt will demonstrate indep ability to dual task while sitting unsupported as a precursor to ADLs ?Additional ADL Goal #3: Pt will donn/doff L wrist cock up splint with min A ?Additional ADL Goal #4: Pt will use LUE as functional assist with mod A during mobility and ADL tasks  ?Plan Discharge plan remains appropriate   ? ?Co-evaluation ? ? ?   ?  ?  ?  ?  ? ?   ?AM-PAC OT "6 Clicks" Daily Activity     ?Outcome Measure ? ? Help from another person eating meals?: A Little ?Help from another person taking care of personal grooming?: A Lot ?Help from another person toileting, which includes using toliet, bedpan, or urinal?: A Lot ?Help from another person bathing (including washing, rinsing, drying)?: A Lot ?Help from another person to put on and taking off regular upper body clothing?: A Lot ?Help from another person to put on and taking off regular lower body clothing?: A Lot ?6 Click Score: 13 ? ?  ?End of Session   ? ?OT Visit Diagnosis: Repeated falls (R29.6);Unsteadiness on feet (R26.81);Other abnormalities of gait and mobility (R26.89);Muscle weakness (generalized) (M62.81);Hemiplegia and hemiparesis;Other symptoms and signs involving cognitive function ?Hemiplegia - Right/Left: Left ?Hemiplegia - dominant/non-dominant: Non-Dominant ?Hemiplegia - caused by: Other cerebrovascular disease ?  ?Activity Tolerance Patient tolerated treatment well ?  ?Patient Left in chair;with  call bell/phone within reach;with chair alarm set;with family/visitor present ?  ?Nurse Communication Mobility status;Need for lift equipment ?  ? ?   ? ?Time: 7341-9379 ?OT Time Calculation (min): 32 min ? ?Charges: OT General Charges ?$OT Visit: 1 Visit ?OT Treatments ?$Neuromuscular Re-education: 23-37 mins ? ?[facility], OT/L  ? ?Acute OT Clinical Specialist ?Acute Rehabilitation Services ?Pager [phone] ?Office [phone]  ? ?Dae Highley,HILLARY ?03/02/2022, 12:15 PM ?

## 2022-03-02 NOTE — Progress Notes (Signed)
Speech Language Pathology Treatment: Cognitive-Linquistic  ?Patient Details ?Name: Patrick Buchanan ?MRN: [id number] ?DOB: 06-30-1951 ?Today's Date: 03/02/2022 ?Time: 1330-1400 ?SLP Time Calculation (min) (ACUTE ONLY): 30 min ? ?Assessment / Plan / Recommendation ?Clinical Impression ? Pt demonstrates improvement in cognition today. He was able to recall the expectation that he stay attentive to therapist directed tasks today. He did not interrupt activity and sustained attention to complex functional problem solving task for 10 minutes. He needed min question cues at times for problem solving, but overall his sequencing and organizational abilities needed to complete task were in tact. Pt verbalized improving awareness. Recommend ongoing SLP interventions, particularly perhaps to address anticipatory awareness and compensatory strategies for dual task training during mobility. His cognition seems to particularly decrease when taxed physically. Will continue efforts.  ?  ?HPI HPI: 71 year old male evaluated s/p bilateral lateral parietal craniotomy with resection of a large parasagittal tumor. Pt initially presented with progressive left lower extremity and upper extremity weakness.  Work-up demonstrated evidence of severe multifocal disease consistent with multiple meningiomas.  Patient with a prior history of spinal meningioma status postresection patient also with history of aneurysm clipping with a not MRI compatible clip in place.  Patient's largest tumor is right parasagittal.  There is questionable invasion of the superior sagittal sinus.  Patient also with a left sphenoid wing meningioma and a left posterior temporal meningioma. Only the largest tumor was addressed in surgery. ?  ?   ?SLP Plan ? Continue with current plan of care ? ?  ?  ?Recommendations for follow up therapy are one component of a multi-disciplinary discharge planning process, led by the attending physician.  Recommendations may be updated  based on patient status, additional functional criteria and insurance authorization. ?  ? ?Recommendations  ?   ?   ?    ?   ? ? ? ? Plan: Continue with current plan of care ? ? ? ? ?  ?  ? ? ?Andreia Gandolfi, Katherene Ponto ? ?03/02/2022, 2:06 PM ?

## 2022-03-03 MED ORDER — FLEET ENEMA 7-19 GM/118ML RE ENEM
1.0000 | ENEMA | Freq: Every day | RECTAL | Status: DC | PRN
  Administered 2022-03-03: 1 via RECTAL
  Filled 2022-03-03: qty 1

## 2022-03-03 MED ORDER — BISACODYL 10 MG RE SUPP
10.0000 mg | Freq: Every day | RECTAL | Status: DC | PRN
  Administered 2022-03-03: 10 mg via RECTAL
  Filled 2022-03-03: qty 1

## 2022-03-03 MED ORDER — DOCUSATE SODIUM 100 MG PO CAPS
100.0000 mg | ORAL_CAPSULE | Freq: Two times a day (BID) | ORAL | Status: DC
  Administered 2022-03-03 – 2022-03-07 (×7): 100 mg via ORAL
  Filled 2022-03-03 (×7): qty 1

## 2022-03-03 MED ORDER — POLYETHYLENE GLYCOL 3350 17 G PO PACK
17.0000 g | PACK | Freq: Two times a day (BID) | ORAL | Status: DC
Start: 2022-03-03 — End: 2022-03-07
  Administered 2022-03-03 – 2022-03-07 (×5): 17 g via ORAL
  Filled 2022-03-03 (×7): qty 1

## 2022-03-03 NOTE — Progress Notes (Signed)
Patient transferred from the ICU today around noon. He asked for a suppository and the Nurse that I got report from said the doctor gave a verbal order for him to have one. That didn't work so I got another order for an enema because the patient requested that. The enema has not worked yet and patient requested for me to do something else so I paged on call NP and she said to put orders in for colace and miralax to be scheduled and if he needs something tomorrow to do another enema.  ?

## 2022-03-03 NOTE — Progress Notes (Signed)
Physical Therapy Treatment ?Patient Details ?Name: Patrick Buchanan ?MRN: [id number] ?DOB: 09-Aug-1951 ?Today's Date: 03/03/2022 ? ? ?History of Present Illness Pt is a 71 y.o. 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 Significant PMH: cerebral aneurysm, DM. ? ?  ?PT Comments  ? ? Pt tolerates treatment well with improved activation of L knee extensors. Pt continues to demonstrate strong left lateral lean but demonstrates improved awareness of the lean and attempts to correct without cues more frequently. Pt is able to transfer from lower surfaces this session with PT assist. Pt demonstrates intermittent LLE spasticity in flexors, making weightbearing difficult. Pt will benefit from continued aggressive mobilization in an effort to reduce falls risk and improve balance. PT continues to recommend AIR admission.  ?Recommendations for follow up therapy are one component of a multi-disciplinary discharge planning process, led by the attending physician.  Recommendations may be updated based on patient status, additional functional criteria and insurance authorization. ? ?Follow Up Recommendations ? Acute inpatient rehab (3hours/day) ?  ?  ?Assistance Recommended at Discharge Frequent or constant Supervision/Assistance  ?Patient can return home with the following Two people to help with walking and/or transfers;Two people to help with bathing/dressing/bathroom ?  ?Equipment Recommendations ? Wheelchair (measurements PT);Wheelchair cushion (measurements PT);BSC/3in1  ?  ?Recommendations for Other Services   ? ? ?  ?Precautions / Restrictions Precautions ?Precautions: Fall;Other (comment) ?Precaution Comments: L hemi ?Restrictions ?Weight Bearing Restrictions: No  ?  ? ?Mobility ? Bed Mobility ?Overal bed mobility: Needs Assistance ?Bed Mobility: Supine to Sit ?  ?  ?Supine to sit: Mod assist,  HOB elevated ?  ?  ?General bed mobility comments: pt benefits from UE support to pull into sitting, assist to mobilize LLE, and assist to pivot hips. Pt with left lateral lean ?  ? ?Transfers ?Overall transfer level: Needs assistance ?Equipment used: 1 person hand held assist ?Transfers: Sit to/from Stand, Bed to chair/wheelchair/BSC ?Sit to Stand: Mod assist, Max assist ?  ?Step pivot transfers: Max assist ?  ?  ?  ?General transfer comment: pt able to initiate steps with left foot for step-pivot with PT assist to weight shift to right side. Pt continues to require significant physical assistance to stand, with L knee block. Emphasis on increasing trunk flexion prior to attempting to rise. Pt stands 5 times with 2 step pivot transfers ?  ? ?Ambulation/Gait ?  ?  ?  ?  ?  ?  ?  ?  ? ? ?Stairs ?  ?  ?  ?  ?  ? ? ?Wheelchair Mobility ?  ? ?Modified Rankin (Stroke Patients Only) ?Modified Rankin (Stroke Patients Only) ?Pre-Morbid Rankin Score: No symptoms ?Modified Rankin: Severe disability ? ? ?  ?Balance Overall balance assessment: Needs assistance ?Sitting-balance support: Single extremity supported, Feet supported ?Sitting balance-Leahy Scale: Poor ?Sitting balance - Comments: min-modA, left lateral lean ?Postural control: Left lateral lean ?Standing balance support: Single extremity supported ?Standing balance-Leahy Scale: Poor ?Standing balance comment: maxA, left lateral lean ?  ?  ?  ?  ?  ?  ?  ?  ?  ?  ?  ?  ? ?  ?Cognition Arousal/Alertness: Awake/alert ?Behavior During Therapy: [facility] for tasks assessed/performed ?Overall Cognitive Status: Impaired/Different from baseline ?Area of Impairment: Attention, Awareness, Problem solving ?  ?  ?  ?  ?  ?  ?  ?  ?  ?  Current Attention Level: Sustained ?  ?Following Commands: Follows one step commands consistently ?Safety/Judgement: Decreased awareness of safety, Decreased awareness of deficits ?Awareness: Emergent ?Problem Solving: Slow processing, Difficulty  sequencing, Decreased initiation ?  ?  ?  ? ?  ?Exercises General Exercises - Lower Extremity ?Quad Sets: AROM, Left, 5 reps ? ?  ?General Comments General comments (skin integrity, edema, etc.): VSS on RA ?  ?  ? ?Pertinent Vitals/Pain Pain Assessment ?Pain Assessment: No/denies pain  ? ? ?Home Living   ?  ?  ?  ?  ?  ?  ?  ?  ?  ?   ?  ?Prior Function    ?  ?  ?   ? ?PT Goals (current goals can now be found in the care plan section) Acute Rehab PT Goals ?Patient Stated Goal: for improved left sided strength ?Progress towards PT goals: Progressing toward goals ? ?  ?Frequency ? ? ? Min 4X/week ? ? ? ?  ?PT Plan Current plan remains appropriate  ? ? ?Co-evaluation   ?  ?  ?  ?  ? ?  ?AM-PAC PT "6 Clicks" Mobility   ?Outcome Measure ? Help needed turning from your back to your side while in a flat bed without using bedrails?: A Lot ?Help needed moving from lying on your back to sitting on the side of a flat bed without using bedrails?: A Lot ?Help needed moving to and from a bed to a chair (including a wheelchair)?: A Lot ?Help needed standing up from a chair using your arms (e.g., wheelchair or bedside chair)?: A Lot ?Help needed to walk in hospital room?: Total ?Help needed climbing 3-5 steps with a railing? : Total ?6 Click Score: 10 ? ?  ?End of Session   ?Activity Tolerance: Patient tolerated treatment well ?Patient left: in chair;with call bell/phone within reach;with chair alarm set ?Nurse Communication: Mobility status;Need for lift equipment ?PT Visit Diagnosis: Hemiplegia and hemiparesis;Other abnormalities of gait and mobility (R26.89) ?Hemiplegia - Right/Left: Left ?Hemiplegia - dominant/non-dominant: Non-dominant ?Hemiplegia - caused by:  (brain mass resection) ?  ? ? ?Time: 0998-3382 ?PT Time Calculation (min) (ACUTE ONLY): 48 min ? ?Charges:  $Therapeutic Activity: 38-52 mins          ?          ? ?Zenaida Niece, PT, DPT ?Acute Rehabilitation ?Pager: [phone] ?Office [phone] ? ? ? ?Zenaida Niece ?03/03/2022, 10:52 AM ? ?

## 2022-03-03 NOTE — Progress Notes (Addendum)
Inpatient Rehabilitation Admissions Coordinator  ? ?I continue to await insurance determination for possible Cir admit. I met with patient and his daughter Valetta Fuller , at bedside and they are aware. I reviewed estimated cost of care if Cir approved. ? ?Danne Baxter, RN, MSN ?Rehab Admissions Coordinator ?[phone] ?03/03/2022 2:04 PM ? ? ?I have received approval for a CIR admit, but no bed available until Monday or Tuesday. Daughters and patient have been made aware. ? ?Danne Baxter, RN, MSN ?Rehab Admissions Coordinator ?[phone] ?03/03/2022 3:19 PM ? ?

## 2022-03-03 NOTE — Progress Notes (Signed)
Postop day 4.  No new issues or problems.  No headache.  Patient feels like he needs to have a bowel movement.  Has noted some intermittent movement of his left lower extremity. ? ?Afebrile.  Vital signs are stable.  Awake and aware.  Oriented and appropriate.  Speech is fluent.  Judgment insight are intact.  Motor examination with good grip and fine motor in his left hand.  Trace wrist extensor and triceps function.  Patient did have some voluntary knee extension in his left lower extremity which is new.  Wound healing well. ? ?Overall progressing well.  Continue rehab efforts.  Patient medically stable for rehab when bed available. ?

## 2022-03-04 MED ORDER — DEXAMETHASONE SODIUM PHOSPHATE 4 MG/ML IJ SOLN
2.0000 mg | Freq: Four times a day (QID) | INTRAMUSCULAR | Status: DC
  Administered 2022-03-04 – 2022-03-07 (×13): 2 mg via INTRAVENOUS
  Filled 2022-03-04 (×13): qty 1

## 2022-03-04 NOTE — Plan of Care (Signed)

## 2022-03-05 NOTE — Progress Notes (Signed)
NEUROSURGERY PROGRESS NOTE ? ?Postop day 6. Doing well. Complains of appropriate head soreness. ?Still hemiplegic on the left, able to wiggle his fingers on the left ? ?Temp:  [98.2 ?F (36.8 ?C)-99 ?F (37.2 ?C)] 99 ?F (37.2 ?C) (05/14 0715) ?Pulse Rate:  [59-78] 70 (05/14 0715) ?Resp:  [14-24] 20 (05/14 0715) ?BP: (124-140)/(80-85) 124/82 (05/14 0715) ?SpO2:  [92 %-98 %] 93 % (05/14 0715) ? ? ? ?Patrick Chiquito, NP ?03/05/2022 ?9:41 AM ?  ?

## 2022-03-06 ENCOUNTER — Telehealth: Payer: Self-pay | Admitting: *Deleted

## 2022-03-06 MED ORDER — FAMOTIDINE 20 MG PO TABS
10.0000 mg | ORAL_TABLET | Freq: Every day | ORAL | Status: DC
  Administered 2022-03-06 – 2022-03-07 (×2): 10 mg via ORAL
  Filled 2022-03-06 (×2): qty 1

## 2022-03-06 MED ORDER — CALCIUM CARBONATE ANTACID 500 MG PO CHEW
1.0000 | CHEWABLE_TABLET | Freq: Three times a day (TID) | ORAL | Status: DC
  Administered 2022-03-06 – 2022-03-07 (×3): 200 mg via ORAL
  Filled 2022-03-06 (×3): qty 1

## 2022-03-06 NOTE — Telephone Encounter (Signed)
Mychart message from patient: ? ?Should I be worried about the low Red blood counts? ?  ?_____________________________________ ?Response from Dr. Krista Blue:  ? 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  ?It is expected to have some blood loss, will have lower hemoglobin level, ?  ?Hemoglobin now is 10.3, decreased from his baseline of 12.9, ?  ?May consider repeat laboratory evaluation later, ? ?Please check to see how is he recovering from his surgery ?

## 2022-03-06 NOTE — Telephone Encounter (Addendum)
I spoke to the patient's daughter on DPR [name]). The patient is still admitted at this time. She was provided with the information concerning his lab results. She told me he lost quite of bit of blood during his surgery and has received two blood transfusions. Says he is still having significant left-sided weakness and will be transferring to rehab at [facility] for a few weeks.  ?

## 2022-03-06 NOTE — Progress Notes (Signed)
PT Cancellation Note ? ?Patient Details ?Name: Patrick Buchanan ?MRN: [id number] ?DOB: [birth year] ? ? ?Cancelled Treatment:    Reason Eval/Treat Not Completed: Patient declined, no reason specified. Pt declines PT twice today, initially reporting fatigue from a difficult night and on 2nd attempt reporting he is waiting for OT to complete adjusting a splint. PT will attempt to follow up as time allows. ? ? ?Zenaida Niece ?03/06/2022, 3:26 PM ?

## 2022-03-06 NOTE — Progress Notes (Signed)
Inpatient Rehabilitation Admissions Coordinator  ? ?CIR bed is not available to admit him today. Hopeful for the next 24 to 48 hrs for bed availability. ? ?Danne Baxter, RN, MSN ?Rehab Admissions Coordinator ?[phone] ?03/06/2022 12:44 PM ? ?

## 2022-03-06 NOTE — Care Management Important Message (Signed)
Important Message ? ?Patient Details  ?Name: Patrick Buchanan ?MRN: [id number] ?Date of Birth: July 22, 1951 ? ? ?Medicare Important Message Given:  Yes ? ? ? ? ?Memory Argue ?03/06/2022, 2:18 PM ?

## 2022-03-06 NOTE — Progress Notes (Signed)
Overall about the same.  Still with significant left-sided weakness.  Continues to regain some voluntary motor function in his left lower extremity.  Proximal left upper extremity symptoms without evidence of return yet.  Wound healing well.  No other issues. ? ?Patient overall stable.  Awaiting inpatient rehabilitation bed. ?

## 2022-03-06 NOTE — Progress Notes (Signed)
Occupational Therapy Treatment ?Patient Details ?Name: Patrick Buchanan ?MRN: [id number] ?DOB: 1951-04-03 ?Today's Date: 03/06/2022 ? ? ?History of present illness Pt is a 71 y.o. 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 Significant PMH: cerebral aneurysm, DM. ?  ?OT comments ? [name] is progressing well, session focused on LUE exercises and facilitation of active movement as well as placement of K-tape to encourage wrist extension. K-tape placed wrapping around palm due to IV placement located where anchor would need to be placed using a dorsal taping method. Pt was lethargic upon arrival and had difficulty staying awake throughout the session. He tolerated PROM/AAROM of digits, wrist, elbow and shoulder. Pt declining OOB this date, wanting to rest instead. OT to continue to follow. D/c recommendation remains appropriate.   ? ?Recommendations for follow up therapy are one component of a multi-disciplinary discharge planning process, led by the attending physician.  Recommendations may be updated based on patient status, additional functional criteria and insurance authorization. ?   ?Follow Up Recommendations ? Acute inpatient rehab (3hours/day)  ?  ?Assistance Recommended at Discharge Frequent or constant Supervision/Assistance  ?Patient can return home with the following ? Two people to help with walking and/or transfers;Two people to help with bathing/dressing/bathroom;Help with stairs or ramp for entrance;Assist for transportation;Assistance with cooking/housework;Direct supervision/assist for medications management;Direct supervision/assist for financial management ?  ?Equipment Recommendations ? Other (comment)  ?  ?Recommendations for Other Services Rehab consult;Speech consult ? ?  ?Precautions / Restrictions Precautions ?Precautions: Fall;Other (comment) ?Precaution Comments: L  hemi ?Restrictions ?Weight Bearing Restrictions: No  ? ? ?  ? ?Mobility Bed Mobility ?Overal bed mobility: Needs Assistance ?  ?  ?  ?  ?  ?  ?General bed mobility comments: session completed supine at bed level ?  ? ?Transfers ?Overall transfer level: Needs assistance ?  ?  ?  ?  ?  ?  ?  ?  ?  ?  ?  ?   ? ?ADL either performed or assessed with clinical judgement  ? ?ADL Overall ADL's : Needs assistance/impaired ?  ?  ?  ?  ?  ?  ?  ?  ?  ?General ADL Comments: focus of session on LUE ?  ? ?Extremity/Trunk Assessment Upper Extremity Assessment ?Upper Extremity Assessment: LUE deficits/detail;RUE deficits/detail ?RUE Deficits / Details: WFL. ?LUE Deficits / Details: some active finger extension and wrist flexion noted. able  to oppose thumb to 1-3 digits when wrist held at neutral; after facilitation, able to elicit shoulder adduction; increased tone. K-tape used to promoted wrist extension ?LUE Sensation: WNL ?LUE Coordination: decreased fine motor;decreased gross motor ?  ?Lower Extremity Assessment ?Lower Extremity Assessment: Defer to PT evaluation ?  ?  ?  ? ?Vision   ?Vision Assessment?: Vision impaired- to be further tested in functional context ?Additional Comments: wears glasses ?  ?   ?   ? ?Cognition Arousal/Alertness: Awake/alert ?Behavior During Therapy: [facility] for tasks assessed/performed ?Overall Cognitive Status: Impaired/Different from baseline ?Area of Impairment: Attention, Awareness, Problem solving ?  ?  ?  ?  ?  ?  ?  ?  ?  ?Current Attention Level: Sustained ?  ?Following Commands: Follows one step commands consistently ?Safety/Judgement: Decreased awareness of safety, Decreased awareness of deficits ?Awareness: Emergent ?Problem Solving: Slow processing, Difficulty sequencing, Decreased initiation ?General Comments: decreased attention affecting postural control- good recall of brace wear/care ?  ?  ?   ?  Exercises Other Exercises ?Other Exercises: K-tape added to L hand/wrist to faciliate wrist  extension ?Other Exercises: PROM L shoulder/elbow ? ?  ?Shoulder Instructions   ? ? ?  ?General Comments VSS on RA, pt lethargic with report of not sleeping well last night.  ? ? ?Pertinent Vitals/ Pain       Pain Assessment ?Pain Assessment: Faces ?Faces Pain Scale: Hurts a little bit ?Pain Location: L wrist with PROM ?Pain Descriptors / Indicators: Discomfort, Grimacing ?Pain Intervention(s): Limited activity within patient's tolerance, Monitored during session ? ? ?Frequency ? Min 2X/week  ? ? ? ? ?  ?Progress Toward Goals ? ?OT Goals(current goals can now be found in the care plan section) ? Progress towards OT goals: Progressing toward goals ? ?Acute Rehab OT Goals ?Patient Stated Goal: to rehab soon ?OT Goal Formulation: With patient ?Time For Goal Achievement: 03/15/22 ?Potential to Achieve Goals: Good ?ADL Goals ?Pt Will Perform Upper Body Dressing: with min assist;sitting ?Pt Will Perform Lower Body Dressing: with mod assist;sit to/from stand ?Pt Will Transfer to Toilet: with mod assist;stand pivot transfer;bedside commode ?Pt Will Perform Toileting - Clothing Manipulation and hygiene: with min assist;sitting/lateral leans ?Pt/caregiver will Perform Home Exercise Program: Left upper extremity;With minimal assist;Increased ROM;Increased strength ?Additional ADL Goal #1: Pt will complete bed mobilty with min A as a precursor to ADLs ?Additional ADL Goal #2: Pt will demonstrate indep ability to dual task while sitting unsupported as a precursor to ADLs ?Additional ADL Goal #3: Pt will donn/doff L wrist cock up splint with min A ?Additional ADL Goal #4: Pt will use LUE as functional assist with mod A during mobility and ADL tasks  ?Plan Discharge plan remains appropriate   ? ?   ?AM-PAC OT "6 Clicks" Daily Activity     ?Outcome Measure ? ? Help from another person eating meals?: A Little ?Help from another person taking care of personal grooming?: A Lot ?Help from another person toileting, which includes using  toliet, bedpan, or urinal?: A Lot ?Help from another person bathing (including washing, rinsing, drying)?: A Lot ?Help from another person to put on and taking off regular upper body clothing?: A Lot ?Help from another person to put on and taking off regular lower body clothing?: A Lot ?6 Click Score: 13 ? ?  ?End of Session   ? ?OT Visit Diagnosis: Repeated falls (R29.6);Unsteadiness on feet (R26.81);Other abnormalities of gait and mobility (R26.89);Muscle weakness (generalized) (M62.81);Hemiplegia and hemiparesis;Other symptoms and signs involving cognitive function ?Hemiplegia - Right/Left: Left ?Hemiplegia - dominant/non-dominant: Non-Dominant ?Hemiplegia - caused by: Other cerebrovascular disease ?  ?Activity Tolerance Patient tolerated treatment well ?  ?Patient Left in bed;with call bell/phone within reach ?  ?Nurse Communication Mobility status ?  ? ?   ? ?Time: 6629-4765 ?OT Time Calculation (min): 17 min ? ?Charges: OT General Charges ?$OT Visit: 1 Visit ?OT Treatments ?$Therapeutic Activity: 8-22 mins ? ? ? ?Fahim Kats A Lauriann Milillo ?03/06/2022, 3:32 PM ?

## 2022-03-07 ENCOUNTER — Inpatient Hospital Stay (HOSPITAL_COMMUNITY)
Admission: RE | Admit: 2022-03-07 | Discharge: 2022-03-30 | DRG: 057 | Disposition: A | Discharge status: Home Health | Payer: Medicare HMO | Source: Intra-hospital | Attending: Physical Medicine & Rehabilitation | Admitting: Physical Medicine & Rehabilitation

## 2022-03-07 ENCOUNTER — Other Ambulatory Visit: Payer: Self-pay

## 2022-03-07 ENCOUNTER — Ambulatory Visit: Payer: Medicare HMO | Admitting: Neurology

## 2022-03-07 ENCOUNTER — Encounter (HOSPITAL_COMMUNITY): Payer: Self-pay | Admitting: Physical Medicine & Rehabilitation

## 2022-03-07 DIAGNOSIS — M792 Neuralgia and neuritis, unspecified: Secondary | ICD-10-CM

## 2022-03-07 DIAGNOSIS — E119 Type 2 diabetes mellitus without complications: Secondary | ICD-10-CM | POA: Diagnosis present

## 2022-03-07 DIAGNOSIS — I10 Essential (primary) hypertension: Secondary | ICD-10-CM | POA: Diagnosis not present

## 2022-03-07 DIAGNOSIS — M25562 Pain in left knee: Secondary | ICD-10-CM | POA: Diagnosis not present

## 2022-03-07 DIAGNOSIS — E782 Mixed hyperlipidemia: Secondary | ICD-10-CM | POA: Diagnosis present

## 2022-03-07 DIAGNOSIS — G8114 Spastic hemiplegia affecting left nondominant side: Principal | ICD-10-CM | POA: Diagnosis present

## 2022-03-07 DIAGNOSIS — S43002A Unspecified subluxation of left shoulder joint, initial encounter: Secondary | ICD-10-CM | POA: Diagnosis not present

## 2022-03-07 DIAGNOSIS — T380X5A Adverse effect of glucocorticoids and synthetic analogues, initial encounter: Secondary | ICD-10-CM | POA: Diagnosis present

## 2022-03-07 DIAGNOSIS — R490 Dysphonia: Secondary | ICD-10-CM | POA: Diagnosis present

## 2022-03-07 DIAGNOSIS — K219 Gastro-esophageal reflux disease without esophagitis: Secondary | ICD-10-CM | POA: Diagnosis present

## 2022-03-07 DIAGNOSIS — Z7409 Other reduced mobility: Secondary | ICD-10-CM | POA: Diagnosis present

## 2022-03-07 DIAGNOSIS — Z86011 Personal history of benign neoplasm of the brain: Secondary | ICD-10-CM

## 2022-03-07 DIAGNOSIS — M11262 Other chondrocalcinosis, left knee: Secondary | ICD-10-CM | POA: Diagnosis present

## 2022-03-07 DIAGNOSIS — K5900 Constipation, unspecified: Secondary | ICD-10-CM | POA: Diagnosis present

## 2022-03-07 DIAGNOSIS — D329 Benign neoplasm of meninges, unspecified: Secondary | ICD-10-CM | POA: Diagnosis present

## 2022-03-07 DIAGNOSIS — D72828 Other elevated white blood cell count: Secondary | ICD-10-CM | POA: Diagnosis not present

## 2022-03-07 DIAGNOSIS — Z8679 Personal history of other diseases of the circulatory system: Secondary | ICD-10-CM | POA: Diagnosis not present

## 2022-03-07 DIAGNOSIS — Z86718 Personal history of other venous thrombosis and embolism: Secondary | ICD-10-CM | POA: Diagnosis not present

## 2022-03-07 DIAGNOSIS — F341 Dysthymic disorder: Secondary | ICD-10-CM

## 2022-03-07 DIAGNOSIS — R32 Unspecified urinary incontinence: Secondary | ICD-10-CM | POA: Diagnosis not present

## 2022-03-07 DIAGNOSIS — E785 Hyperlipidemia, unspecified: Secondary | ICD-10-CM | POA: Diagnosis present

## 2022-03-07 DIAGNOSIS — Z87891 Personal history of nicotine dependence: Secondary | ICD-10-CM | POA: Diagnosis not present

## 2022-03-07 DIAGNOSIS — E1169 Type 2 diabetes mellitus with other specified complication: Secondary | ICD-10-CM | POA: Diagnosis not present

## 2022-03-07 DIAGNOSIS — Z7401 Bed confinement status: Secondary | ICD-10-CM | POA: Diagnosis not present

## 2022-03-07 DIAGNOSIS — G8194 Hemiplegia, unspecified affecting left nondominant side: Secondary | ICD-10-CM | POA: Diagnosis present

## 2022-03-07 DIAGNOSIS — M25512 Pain in left shoulder: Secondary | ICD-10-CM | POA: Diagnosis present

## 2022-03-07 DIAGNOSIS — F32A Depression, unspecified: Secondary | ICD-10-CM | POA: Diagnosis not present

## 2022-03-07 DIAGNOSIS — R202 Paresthesia of skin: Secondary | ICD-10-CM

## 2022-03-07 DIAGNOSIS — E1165 Type 2 diabetes mellitus with hyperglycemia: Secondary | ICD-10-CM | POA: Diagnosis present

## 2022-03-07 DIAGNOSIS — R509 Fever, unspecified: Secondary | ICD-10-CM | POA: Diagnosis not present

## 2022-03-07 DIAGNOSIS — I82442 Acute embolism and thrombosis of left tibial vein: Secondary | ICD-10-CM | POA: Diagnosis not present

## 2022-03-07 DIAGNOSIS — E669 Obesity, unspecified: Secondary | ICD-10-CM | POA: Diagnosis not present

## 2022-03-07 DIAGNOSIS — Z833 Family history of diabetes mellitus: Secondary | ICD-10-CM | POA: Diagnosis not present

## 2022-03-07 DIAGNOSIS — Z803 Family history of malignant neoplasm of breast: Secondary | ICD-10-CM

## 2022-03-07 DIAGNOSIS — D62 Acute posthemorrhagic anemia: Secondary | ICD-10-CM | POA: Diagnosis present

## 2022-03-07 DIAGNOSIS — Z79899 Other long term (current) drug therapy: Secondary | ICD-10-CM

## 2022-03-07 DIAGNOSIS — R252 Cramp and spasm: Secondary | ICD-10-CM | POA: Diagnosis not present

## 2022-03-07 DIAGNOSIS — Z7952 Long term (current) use of systemic steroids: Secondary | ICD-10-CM | POA: Diagnosis not present

## 2022-03-07 DIAGNOSIS — K5901 Slow transit constipation: Secondary | ICD-10-CM | POA: Diagnosis not present

## 2022-03-07 DIAGNOSIS — R29898 Other symptoms and signs involving the musculoskeletal system: Secondary | ICD-10-CM | POA: Diagnosis not present

## 2022-03-07 DIAGNOSIS — E559 Vitamin D deficiency, unspecified: Secondary | ICD-10-CM

## 2022-03-07 DIAGNOSIS — I82409 Acute embolism and thrombosis of unspecified deep veins of unspecified lower extremity: Secondary | ICD-10-CM | POA: Diagnosis not present

## 2022-03-07 DIAGNOSIS — R609 Edema, unspecified: Secondary | ICD-10-CM | POA: Diagnosis not present

## 2022-03-07 MED ORDER — HYDROCODONE-ACETAMINOPHEN 5-325 MG PO TABS
1.0000 | ORAL_TABLET | ORAL | Status: DC | PRN
  Administered 2022-03-07 – 2022-03-29 (×24): 1 via ORAL
  Filled 2022-03-07 (×24): qty 1

## 2022-03-07 MED ORDER — NALOXONE HCL 0.4 MG/ML IJ SOLN: 0.0800 mg | INTRAMUSCULAR | Status: DC | PRN

## 2022-03-07 MED ORDER — FLEET ENEMA 7-19 GM/118ML RE ENEM
1.0000 | ENEMA | Freq: Once | RECTAL | Status: DC | PRN
  Filled 2022-03-07: qty 1

## 2022-03-07 MED ORDER — METHOCARBAMOL 500 MG PO TABS
500.0000 mg | ORAL_TABLET | Freq: Four times a day (QID) | ORAL | Status: DC | PRN
  Administered 2022-03-19 – 2022-03-27 (×3): 500 mg via ORAL
  Filled 2022-03-07 (×3): qty 1

## 2022-03-07 MED ORDER — LEVETIRACETAM 500 MG PO TABS
500.0000 mg | ORAL_TABLET | Freq: Two times a day (BID) | ORAL | Status: DC
  Administered 2022-03-07 – 2022-03-30 (×46): 500 mg via ORAL
  Filled 2022-03-07 (×46): qty 1

## 2022-03-07 MED ORDER — CALCIUM CARBONATE ANTACID 500 MG PO CHEW
1.0000 | CHEWABLE_TABLET | Freq: Three times a day (TID) | ORAL | Status: DC
  Administered 2022-03-07 – 2022-03-29 (×65): 200 mg via ORAL
  Filled 2022-03-07 (×68): qty 1

## 2022-03-07 MED ORDER — VITAMIN D 25 MCG (1000 UNIT) PO TABS
5000.0000 [IU] | ORAL_TABLET | Freq: Every day | ORAL | Status: DC
  Administered 2022-03-08 – 2022-03-30 (×23): 5000 [IU] via ORAL
  Filled 2022-03-07 (×24): qty 5

## 2022-03-07 MED ORDER — PROCHLORPERAZINE 25 MG RE SUPP: 12.5000 mg | Freq: Four times a day (QID) | RECTAL | Status: DC | PRN

## 2022-03-07 MED ORDER — ADULT MULTIVITAMIN W/MINERALS CH
1.0000 | ORAL_TABLET | Freq: Every day | ORAL | Status: DC
  Administered 2022-03-08 – 2022-03-30 (×23): 1 via ORAL
  Filled 2022-03-07 (×24): qty 1

## 2022-03-07 MED ORDER — LAMOTRIGINE 100 MG PO TABS
100.0000 mg | ORAL_TABLET | Freq: Two times a day (BID) | ORAL | Status: DC
  Administered 2022-03-07 – 2022-03-30 (×46): 100 mg via ORAL
  Filled 2022-03-07 (×46): qty 1

## 2022-03-07 MED ORDER — PANTOPRAZOLE SODIUM 40 MG PO TBEC
40.0000 mg | DELAYED_RELEASE_TABLET | Freq: Every day | ORAL | Status: DC
  Administered 2022-03-07 – 2022-03-29 (×23): 40 mg via ORAL
  Filled 2022-03-07 (×23): qty 1

## 2022-03-07 MED ORDER — DIPHENHYDRAMINE HCL 12.5 MG/5ML PO ELIX
12.5000 mg | ORAL_SOLUTION | Freq: Four times a day (QID) | ORAL | Status: DC | PRN
  Administered 2022-03-08 – 2022-03-28 (×19): 25 mg via ORAL
  Filled 2022-03-07 (×18): qty 10

## 2022-03-07 MED ORDER — SENNOSIDES-DOCUSATE SODIUM 8.6-50 MG PO TABS: 1.0000 | ORAL_TABLET | Freq: Every evening | ORAL | Status: DC | PRN

## 2022-03-07 MED ORDER — DOCUSATE SODIUM 100 MG PO CAPS
100.0000 mg | ORAL_CAPSULE | Freq: Two times a day (BID) | ORAL | Status: DC
  Administered 2022-03-07 – 2022-03-16 (×16): 100 mg via ORAL
  Filled 2022-03-07 (×17): qty 1

## 2022-03-07 MED ORDER — VITAMIN D (ERGOCALCIFEROL) 1.25 MG (50000 UNIT) PO CAPS
50000.0000 [IU] | ORAL_CAPSULE | ORAL | Status: DC
  Administered 2022-03-12 – 2022-03-26 (×3): 50000 [IU] via ORAL
  Filled 2022-03-07 (×3): qty 1

## 2022-03-07 MED ORDER — DOCUSATE SODIUM 100 MG PO CAPS: 100.0000 mg | ORAL_CAPSULE | Freq: Two times a day (BID) | ORAL | Status: DC

## 2022-03-07 MED ORDER — ROSUVASTATIN CALCIUM 20 MG PO TABS
20.0000 mg | ORAL_TABLET | Freq: Every day | ORAL | Status: DC
  Administered 2022-03-08 – 2022-03-30 (×23): 20 mg via ORAL
  Filled 2022-03-07 (×23): qty 1

## 2022-03-07 MED ORDER — PROCHLORPERAZINE MALEATE 5 MG PO TABS: 5.0000 mg | ORAL_TABLET | Freq: Four times a day (QID) | ORAL | Status: DC | PRN

## 2022-03-07 MED ORDER — LABETALOL HCL 5 MG/ML IV SOLN
10.0000 mg | INTRAVENOUS | Status: DC | PRN
  Filled 2022-03-07: qty 8

## 2022-03-07 MED ORDER — POLYVINYL ALCOHOL 1.4 % OP SOLN
1.0000 [drp] | OPHTHALMIC | Status: DC | PRN
  Administered 2022-03-13 – 2022-03-24 (×3): 1 [drp] via OPHTHALMIC
  Filled 2022-03-07 (×2): qty 15

## 2022-03-07 MED ORDER — GABAPENTIN 100 MG PO CAPS
100.0000 mg | ORAL_CAPSULE | Freq: Two times a day (BID) | ORAL | Status: DC
  Administered 2022-03-07 – 2022-03-30 (×46): 100 mg via ORAL
  Filled 2022-03-07 (×46): qty 1

## 2022-03-07 MED ORDER — NAPHAZOLINE-PHENIRAMINE 0.025-0.3 % OP SOLN
1.0000 [drp] | Freq: Four times a day (QID) | OPHTHALMIC | Status: DC | PRN
  Filled 2022-03-07: qty 15

## 2022-03-07 MED ORDER — SIMETHICONE 80 MG PO CHEW
80.0000 mg | CHEWABLE_TABLET | Freq: Four times a day (QID) | ORAL | Status: DC | PRN
  Administered 2022-03-07 – 2022-03-28 (×26): 80 mg via ORAL
  Filled 2022-03-07 (×27): qty 1

## 2022-03-07 MED ORDER — GUAIFENESIN-DM 100-10 MG/5ML PO SYRP: 5.0000 mL | ORAL_SOLUTION | Freq: Four times a day (QID) | ORAL | Status: DC | PRN

## 2022-03-07 MED ORDER — POLYETHYLENE GLYCOL 3350 17 G PO PACK
17.0000 g | PACK | Freq: Every day | ORAL | Status: DC
Start: 2022-03-07 — End: 2022-03-08
  Administered 2022-03-07 – 2022-03-08 (×2): 17 g via ORAL
  Filled 2022-03-07 (×2): qty 1

## 2022-03-07 MED ORDER — ALUM & MAG HYDROXIDE-SIMETH 200-200-20 MG/5ML PO SUSP
30.0000 mL | ORAL | Status: DC | PRN
  Administered 2022-03-18: 30 mL via ORAL
  Filled 2022-03-07 (×3): qty 30

## 2022-03-07 MED ORDER — DEXAMETHASONE SODIUM PHOSPHATE 4 MG/ML IJ SOLN
2.0000 mg | Freq: Four times a day (QID) | INTRAMUSCULAR | Status: DC
  Administered 2022-03-07 – 2022-03-08 (×3): 2 mg via INTRAVENOUS
  Filled 2022-03-07 (×4): qty 0.5

## 2022-03-07 MED ORDER — SORBITOL 70 % SOLN
30.0000 mL | Freq: Every day | Status: DC | PRN
  Administered 2022-03-15 – 2022-03-27 (×3): 30 mL via ORAL
  Filled 2022-03-07 (×4): qty 30

## 2022-03-07 MED ORDER — TRAZODONE HCL 50 MG PO TABS
25.0000 mg | ORAL_TABLET | Freq: Every evening | ORAL | Status: DC | PRN
  Administered 2022-03-19 – 2022-03-29 (×5): 50 mg via ORAL
  Filled 2022-03-07 (×5): qty 1

## 2022-03-07 MED ORDER — ACETAMINOPHEN 325 MG PO TABS
325.0000 mg | ORAL_TABLET | ORAL | Status: DC | PRN
  Administered 2022-03-13: 650 mg via ORAL
  Filled 2022-03-07: qty 2

## 2022-03-07 MED ORDER — ESCITALOPRAM OXALATE 10 MG PO TABS
20.0000 mg | ORAL_TABLET | Freq: Every day | ORAL | Status: DC
  Administered 2022-03-07 – 2022-03-29 (×23): 20 mg via ORAL
  Filled 2022-03-07 (×23): qty 2

## 2022-03-07 MED ORDER — PROCHLORPERAZINE EDISYLATE 10 MG/2ML IJ SOLN: 5.0000 mg | Freq: Four times a day (QID) | INTRAMUSCULAR | Status: DC | PRN

## 2022-03-07 NOTE — Discharge Summary (Signed)
?  Physician Discharge Summary  ?Patient ID: ?Patrick Buchanan ?MRN: [id number] ?DOB/AGE: 1951-10-06 71 y.o. ? ?Admit date: 02/27/2022 ?Discharge date: 03/07/2022 ? ?Admission Diagnoses: ? ?Discharge Diagnoses:  ?Principal Problem: ?  Brain tumor ([place]) ?Active Problems: ?  Meningioma ([place]) ? ? ?Discharged Condition: good ? ?Hospital Course: Patient admitted to the hospital where he underwent bilateral parietal craniotomy and resection of large parasagittal meningioma.  Postoperative the patient with some increased left-sided weakness but no other issues.  He did require intraoperative blood transfusion secondary to significant intraoperative blood loss and associated anemia.  He is progressing with therapy.  He is ready for rehab. ? ?Consults:  ? ?Significant Diagnostic Studies:  ? ?Treatments:  ? ?Discharge Exam: ?Blood pressure 125/83, pulse 69, temperature 97.7 ?F (36.5 ?C), temperature source Oral, resp. rate (!) 21, height 6' (1.829 m), weight 98.9 kg, SpO2 93 %. ?Awake and aware.  Oriented and appropriate.  Speech fluent.  Judgment insight intact.  Cranial nerve function normal bilaterally.  Motor examination with normal right upper and lower extremity strength and coordination.  Left side patient has good hand and intrinsic muscle function but has no proximal function in his left upper extremity.  Left lower extremity the patient has some voluntary movement but no functional use of his left lower extremity at this point.  Wound clean and dry.  Chest and abdomen benign. ? ?Disposition: Discharge disposition: 02-Transferred to [facility] ? ? ? ? ? ? ? ?Allergies as of 03/07/2022   ?No Known Allergies ?  ? ?  ?Medication List  ?  ? ?ASK your doctor about these medications   ? ?chlorpheniramine 4 MG tablet ?Commonly known as: CHLOR-TRIMETON ?Take 4 mg by mouth 2 (two) times daily as needed for allergies. ?  ?Cholecalciferol 125 MCG (5000 UT) Tabs ?Take 5,000 Units by mouth daily. ?  ?dexamethasone 2 MG  tablet ?Commonly known as: DECADRON ?Take 2 mg by mouth 2 (two) times daily. ?  ?escitalopram 20 MG tablet ?Commonly known as: LEXAPRO ?TAKE 1 TABLET (20 MG TOTAL) BY MOUTH DAILY. ?  ?gabapentin 100 MG capsule ?Commonly known as: NEURONTIN ?TAKE 1 CAPSULE BY MOUTH 2 TIMES DAILY. ?  ?lamoTRIgine 100 MG tablet ?Commonly known as: LaMICtal ?Take 1 tablet (100 mg total) by mouth 2 (two) times daily. ?  ?multivitamin tablet ?Take 1 tablet by mouth daily. ?  ?polyvinyl alcohol 1.4 % ophthalmic solution ?Commonly known as: LIQUIFILM TEARS ?Place 1 drop into both eyes as needed for dry eyes. ?  ?rosuvastatin 20 MG tablet ?Commonly known as: Crestor ?Take 1 tablet (20 mg total) by mouth daily. ?  ?Visine 0.025-0.3 % ophthalmic solution ?Generic drug: naphazoline-pheniramine ?Place 1 drop into both eyes 4 (four) times daily as needed for eye irritation. ?  ?Vitamin D (Ergocalciferol) 1.25 MG (50000 UNIT) Caps capsule ?Commonly known as: DRISDOL ?Take one tablet wkly ?  ? ?  ? ? ? ?Signed: ?Cooper Render Jimmi Sidener ?03/07/2022, 9:27 AM ? ? ?

## 2022-03-07 NOTE — Progress Notes (Signed)
Jennye Boroughs, MD  ?Physician ?Physical Medicine and Rehabilitation ?PMR Pre-admission    ?Signed ?Date of Service:  03/01/2022  1:44 PM ? Related encounter: Admission (Current) from 02/27/2022 in [facility] ?  ?Signed    ?  ?Show:Clear all ?'[x]'$ Written'[x]'$ Templated'[x]'$ Copied ? ?Added by: ?'[x]'$ Danell Vazquez, Vertis Kelch, RN'[x]'$ Jennye Boroughs, MD ? ?'[]'$ Hover for details ?   ?   ?   ?   ?   ?   ?   ?   ?   ?   ?   ?   ?   ?   ?   ?   ?   ?   ?   ?   ?   ?   ?   ?   ?   ?   ?   ?   ?   ?   ?   ?   ?   ?   ?   ?   ?   ?   ?   ?   ?   ?   ?   ?   ?   ?   ?   ?   ?   ?   ?   ?   ?   ?   ?   ?   ?   ?   ?   ?   ?   ?   ?   ?   ?   ?   ?   ?   ?   ?   ?   ?   ?   ?   ?   ?   ?   ?   ?   ?   ?   ?   ?   ?   ?   ?   ?   ?   ?   ?   ?   ?   ?   ?   ?   ?   ?   ?   ?   ?   ?   ?   ?   ?   ?   ?   ?   ?   ?   ?   ?   ?   ?   ?   ?   ?   ?   ?   ?   ?   ?   ?   ?   ?   ?   ?   ?   ?   ?   ?   ?   ?   ?   ?   ?   ?   ?   ?   ?   ?   ?   ?   ?   ?   ?   ?   ?PMR Admission Coordinator Pre-Admission Assessment ?  ?Patient: Patrick Buchanan is an 71 y.o., male ?MRN: [id number] ?DOB: 30-Sep-1951 ?Height: 6' (182.9 cm) ?Weight: 98.9 kg ?  ?Insurance Information ?HMO: yes    PPO:      PCP:      IPA:      80/20:      OTHER:  ?PRIMARY: [employer]      Policy#: [id number]      Subscriber: pt ?CM Name: Myriam Jacobson      Phone#: [phone] ext [phone]     Fax#: [phone] ?Pre-Cert#: [id number]  approved  for 7 days     Employer:  ?Benefits:  Phone #: [phone]     Name: 5/10 ?Eff. Date: 10/23/21     Deduct: none      Out of Pocket Max: $3400      Life Max: none ?CIR: $295 co pay per day days 1 until 6      SNF: no copay per day days 1 until 20; $196 co pay per day days 21 until 100 ?Outpatient: $10 to $20 per visit     Co-Pay: visits per medical neccesity ?Home Health: 100%      Co-Pay: visits per medical neccesity ?DME: 80%     Co-Pay: 20% ?Providers: in network ? ?SECONDARY: none       ?  ?Financial Counselor:       Phone#:   ?  ?The ?Data Collection Information Summary? for patients in Inpatient Rehabilitation Facilities with attached ?Privacy Act [place] Records? was provided and verbally reviewed with: Patient and Family ?  ?Emergency Contact Information ?Contact Information   ?  ?  Name Relation Home Work Mobile  ?  [name] Daughter     [phone]  ?  Kandler,Katie Daughter     [phone]  ?  ?   ?  ?Current Medical History  ?Patient Admitting Diagnosis: meningioma resection ?  ?History of Present Illness: 71 year old male with history of  DM, GERD, aneurysm clipping 1992 and benign cervical subdural tumor resection 2005. Progressive left sided weakness. CT scan demonstrated evidence of enhancing masses first in his parasagittal region with marked associated mass effect. Tumor approaching 6 cm. He has secondary dural based tumors the first in the left posterior temporal region and left sylvian region also consistent with meningiomas. Presented on 02/27/22 for resection of the large symptomatic tumor with no plans of the other 2 intracranial tumors at present time. ?  ?Postoperatively with 5/5 motor exam to the right and LUE with improved movement to left hand but no voluntary movement of his proximal left upper extremity. Beginning to have some minimal triceps and wrist extensor function. LLE with spastic weakness and no voluntary movement. Follow up CT scan demonstrates good appearance of his resection with out evidence of obvious complicating features. Placed on steroid taper. ?  ?Patient's medical record from [facility] has been reviewed by the rehabilitation admission coordinator and physician. ?  ?Past Medical History  ?    ?Past Medical History:  ?Diagnosis Date  ? Allergy    ?  hay fever  ? Blood transfusion without reported diagnosis    ? Cerebral aneurysm 1992  ?  "leaking"; treated by Dr Sherwood Gambler  ? Depression    ? Diabetes mellitus without complication ([place])    ?  type 2  ? GERD  (gastroesophageal reflux disease)    ?  past hx   ?  ?Has the patient had major surgery during 100 days prior to admission? Yes ?  ?Family History   ?family history includes Breast cancer in his mother; Diabetes in his brother. ?  ?Current Medications ?  ?Current Facility-Administered Medications:  ?  0.9 %  sodium chloride infusion (Manually program via Guardrails IV Fluids), , Intravenous, Once, Earnie Larsson, MD ?  acetaminophen (TYLENOL) tablet 650 mg, 650 mg, Oral, Q4H PRN **OR** acetaminophen (TYLENOL) suppository 650 mg, 650 mg, Rectal, Q4H PRN, Earnie Larsson, MD ?  bisacodyl (DULCOLAX) suppository 10 mg, 10 mg, Rectal, Daily PRN, Earnie Larsson, MD, 10 mg at 03/03/22 1343 ?  calcium carbonate (TUMS - dosed in mg elemental calcium) chewable tablet 200 mg of elemental calcium, 1 tablet, Oral, TID WC, Pool, Henry, MD, 200 mg of elemental calcium at 03/07/22 0915 ?  Chlorhexidine Gluconate Cloth 2 % PADS 6 each, 6 each, Topical, Daily, Earnie Larsson, MD, 6 each at 03/07/22 [phone] ?  cholecalciferol (VITAMIN D3) tablet 5,000 Units, 5,000 Units, Oral, Daily, Earnie Larsson, MD, 5,000 Units at 03/07/22 0915 ?  [COMPLETED] dexamethasone (DECADRON) injection 6 mg, 6 mg, Intravenous, Q6H, 6 mg at 02/28/22 0627 **FOLLOWED BY** [COMPLETED] dexamethasone (DECADRON) injection 4 mg, 4 mg, Intravenous, Q6H, 4 mg at 03/01/22 0643 **FOLLOWED BY** dexamethasone (DECADRON) injection 2 mg, 2 mg, Intravenous, Q6H, Eustace Moore, MD, 2 mg at 03/07/22 0545 ?  diphenhydrAMINE (BENADRYL) capsule 25 mg, 25 mg, Oral, Q6H, Earnie Larsson, MD, 25 mg at 03/07/22 0545 ?  docusate sodium (COLACE) capsule 100 mg, 100 mg, Oral, BID, Bergman, Meghan D, NP, 100 mg at 03/07/22 7782 ?  escitalopram (LEXAPRO) tablet 20 mg, 20 mg, Oral, Daily, Pool, Henry, MD, 20 mg at 03/06/22 2120 ?  famotidine (PEPCID) tablet 10 mg, 10 mg, Oral, Daily, Pool, Mallie Mussel, MD, 10 mg at 03/07/22 0915 ?  gabapentin (NEURONTIN) capsule 100 mg, 100 mg, Oral, BID, Earnie Larsson, MD, 100 mg at  03/07/22 0914 ?  HYDROcodone-acetaminophen (NORCO/VICODIN) 5-325 MG per tablet 1 tablet, 1 tablet, Oral, Q4H PRN, Earnie Larsson, MD, 1 tablet at 03/07/22 0916 ?  HYDROmorphone (DILAUDID) injection 0.5-1 mg, 0.5-1 mg, Intravenous, Q2H PRN, Earnie Larsson, MD, 1 mg at 03/01/22 2140 ?  labetalol (NORMODYNE) injection 10-40 mg, 10-40 mg, Intravenous, Q10 min PRN, Earnie Larsson, MD ?  lamoTRIgine (LAMICTAL) tablet 100 mg, 100 mg, Oral, BID, Earnie Larsson, MD, 100 mg at 03/07/22 0915 ?  levETIRAcetam (KEPPRA) tablet 500 mg, 500 mg, Oral, BID, Pool, Mallie Mussel, MD, 500 mg at 03/07/22 0915 ?  magnesium hydroxide (MILK OF MAGNESIA) suspension 30 mL, 30 mL, Oral, Daily PRN, Earnie Larsson, MD, 30 mL at 03/03/22 1012 ?  multivitamin with minerals tablet 1 tablet, 1 tablet, Oral, Daily, Earnie Larsson, MD, 1 tablet at 03/07/22 4235 ?  naloxone (NARCAN) injection 0.08 mg, 0.08 mg, Intravenous, PRN, Earnie Larsson, MD ?  naphazoline-pheniramine (NAPHCON-A) 0.025-0.3 % ophthalmic solution 1 drop, 1 drop, Both Eyes, QID PRN, Earnie Larsson, MD ?  ondansetron (ZOFRAN) tablet 4 mg, 4 mg, Oral, Q4H PRN **OR** ondansetron (ZOFRAN) injection 4 mg, 4 mg, Intravenous, Q4H PRN, Earnie Larsson, MD ?  pantoprazole (PROTONIX) EC tablet 40 mg, 40 mg, Oral, QHS, Earnie Larsson, MD, 40 mg at 03/06/22 2120 ?  polyethylene glycol (MIRALAX / GLYCOLAX) packet 17 g, 17 g, Oral, BID, Bergman, Meghan D, NP, 17 g at 03/07/22 0916 ?  polyvinyl alcohol (LIQUIFILM TEARS) 1.4 % ophthalmic solution 1 drop, 1 drop, Both Eyes, PRN, Earnie Larsson, MD, 1 drop at 03/01/22 2128 ?  promethazine (PHENERGAN) tablet 12.5-25 mg, 12.5-25 mg, Oral, Q4H PRN, Earnie Larsson, MD, 25 mg at 03/06/22 0600 ?  rosuvastatin (CRESTOR) tablet 20 mg, 20 mg, Oral, Daily, Pool, Henry, MD, 20 mg at 03/07/22 0915 ?  simethicone (MYLICON) chewable tablet 80 mg, 80 mg, Oral, QID PRN, Consuella Lose, MD, 80 mg at 03/06/22 1030 ?  sodium phosphate (FLEET) 7-19 GM/118ML enema 1 enema, 1 enema, Rectal, Daily PRN, Earnie Larsson, MD, 1 enema at 03/03/22 1552 ?  Vitamin D (Ergocalciferol) (DRISDOL) capsule 50,000 Units, 50,000 Units, Oral, Illene Silver, MD, 50,000 Units at 03/05/22 0831 ?  ?  Patients Current Diet:  ?Diet Order

## 2022-03-07 NOTE — Progress Notes (Signed)
Pt and all belongings transferred from 4NP08 to 4W17.  ? ?Justice Rocher, RN ? ?

## 2022-03-07 NOTE — H&P (Signed)
? ?  ?  ?Physical Medicine and Rehabilitation Admission H&P ?  ?  ?CC: Deficits secondary to right parasagittal region tumor s/p resection ?  ?HPI: Patrick Buchanan is a 71 year old male with a history of spinal meningioma who presented with symptomatic multiple cranial meningiomas.  CT scan of the head revealed the largest tumor in the right parietal region as well as large left inferior frontal/sphenoid wing tumors.  The patient stated his symptoms have developed over many months and had a recent fall with bruising and reporting worsening gait.  He had significant left-sided weakness. Arrangements were made for admission for surgical resection.  On the day of admission the patient was taken to the operating room by Dr. Deri Fuelling and underwent bilateral parietal craniotomy with resection of parasagittal tumor greater than 6 cm in diameter, microdissection, partial craniectomy for resection of tumor invading through the skull.  He tolerated the procedure well.  He required intraoperative blood transfusion secondary to significant intraoperative blood loss and associated anemia.  Postoperative physical exam revealed minimal triceps and wrist extensor function, proximal function of left upper extremity.  He had some voluntary movement of the left lower extremity but no functional use per exam on 5/16.  Vital signs were stable and he was afebrile.  Steroids were weaned.  PRAFO boot for left foot and ankle and Velcro wrist splint for left upper extremity. The patient requires inpatient physical medicine and rehabilitation evaluations and treatment secondary to dysfunction due to right parasagittal region tumor s/p resection. ?  ?The patient has a history of paraspinal meningioma resection.  He is followed as an outpatient by Dr. Marcial Pacas.  His medical history is significant for prior history of aneurysm clipping 1992, depression, diabetes type 2, gastroesophageal reflux disease, dyslipidemia. ? ?Pt reports his  voice is a little hoarse since the surgery. He has not had a BM in a bout 2 days.  ?  ?Review of Systems  ?Constitutional:  Negative for chills and fever.  ?HENT:  Negative for congestion and sore throat.   ?     Hoarseness since surgery (attributed to intubation)  ?Eyes:  Negative for double vision.  ?     Left eye dryness  ?Respiratory:  Negative for cough and sputum production.   ?Cardiovascular:  Negative for chest pain and palpitations.  ?Gastrointestinal:  Negative for nausea and vomiting.  ?     Lower abd gas pain  ?Genitourinary:  Positive for frequency and urgency. Negative for dysuria.  ?     Condom cath in place  ?Musculoskeletal:  Negative for neck pain.  ?     Left shoulder stiffness and pain. Head draws forward. Neck pillow helps. Complaining of left leg spasm.  ?Neurological:  Positive for focal weakness. Negative for dizziness.  ?Psychiatric/Behavioral:  Positive for depression.   ?     Takes Lexapro with good effect  ?    ?Past Medical History:  ?Diagnosis Date  ? Allergy    ?  hay fever  ? Blood transfusion without reported diagnosis    ? Cerebral aneurysm 1992  ?  "leaking"; treated by Dr Sherwood Gambler  ? Depression    ? Diabetes mellitus without complication ([place])    ?  type 2  ? GERD (gastroesophageal reflux disease)    ?  past hx   ?  ?     ?Past Surgical History:  ?Procedure Laterality Date  ? APPENDECTOMY      ? CEREBRAL ANEURYSM REPAIR   1992  ?  clipping by Dr Sherwood Gambler; cannot have an MRI  ? CRANIOTOMY Bilateral 02/27/2022  ?  Procedure: Craniotomy - bilateral - Parietal;  Surgeon: Earnie Larsson, MD;  Location: [facility];  Service: Neurosurgery;  Laterality: Bilateral;  ? HERNIA REPAIR N/A 1990  ?  inguinal  ? INGUINAL HERNIA REPAIR      ?  as a child and then another at 81 yrs old  ? TONSILLECTOMY      ? TUMOR EXCISION   2005  ?  benign cervical  ?  ?     ?Family History  ?Problem Relation Age of Onset  ? Breast cancer Mother    ? Diabetes Brother    ? Colon cancer Neg Hx    ? Colon polyps Neg Hx    ?  Esophageal cancer Neg Hx    ? Rectal cancer Neg Hx    ? Stomach cancer Neg Hx    ?  ?Social History:  reports that he quit smoking about 18 years ago. His smoking use included cigarettes. He has a 10.00 pack-year smoking history. He has never used smokeless tobacco. He reports that he does not currently use alcohol. He reports that he does not currently use drugs. ?Allergies: No Known Allergies ?      ?Medications Prior to Admission  ?Medication Sig Dispense Refill  ? chlorpheniramine (CHLOR-TRIMETON) 4 MG tablet Take 4 mg by mouth 2 (two) times daily as needed for allergies.      ? Cholecalciferol 125 MCG (5000 UT) TABS Take 5,000 Units by mouth daily.      ? dexamethasone (DECADRON) 2 MG tablet Take 2 mg by mouth 2 (two) times daily.      ? escitalopram (LEXAPRO) 20 MG tablet TAKE 1 TABLET (20 MG TOTAL) BY MOUTH DAILY. 90 tablet 1  ? gabapentin (NEURONTIN) 100 MG capsule TAKE 1 CAPSULE BY MOUTH 2 TIMES DAILY. 180 capsule 0  ? lamoTRIgine (LAMICTAL) 100 MG tablet Take 1 tablet (100 mg total) by mouth 2 (two) times daily. 60 tablet 11  ? Multiple Vitamin (MULTIVITAMIN) tablet Take 1 tablet by mouth daily.      ? naphazoline-pheniramine (VISINE) 0.025-0.3 % ophthalmic solution Place 1 drop into both eyes 4 (four) times daily as needed for eye irritation.      ? polyvinyl alcohol (LIQUIFILM TEARS) 1.4 % ophthalmic solution Place 1 drop into both eyes as needed for dry eyes.      ? rosuvastatin (CRESTOR) 20 MG tablet Take 1 tablet (20 mg total) by mouth daily. 90 tablet 3  ? Vitamin D, Ergocalciferol, (DRISDOL) 1.25 MG (50000 UNIT) CAPS capsule Take one tablet wkly (Patient taking differently: Take 50,000 Units by mouth every Sunday. Take one tablet wkly) 12 capsule 3  ?  ?  ?  ?  ?Home: ?Home Living ?Family/patient expects to be discharged to:: Private residence ?Living Arrangements: Alone ?Available Help at Discharge: Family ?Type of Home: House ?Home Access: Stairs to enter ?Entrance Stairs-Number of Steps:  3 ?Entrance Stairs-Rails: Left ?Home Layout: Able to live on main level with bedroom/bathroom, Two level ?Bathroom Shower/Tub: Tub/shower unit ?Bathroom Toilet: Standard ?Bathroom Accessibility: Yes ?Home Equipment: Cane - single point, [employer] (2 wheels) ? Lives With: Alone ?  ?Functional History: ?Prior Function ?Prior Level of Function : Independent/Modified Independent, Driving ?Mobility Comments: Retired Midwife ?ADLs Comments: indep, reports difficulty with socks. drives. sponge bathes at baseline ?  ?Functional Status:  ?Mobility: ?Bed Mobility ?Overal bed mobility: Needs Assistance ?Bed Mobility: Supine to Sit ?  Rolling: Mod assist, Max assist ?Supine to sit: Mod assist, HOB elevated ?Sit to supine: Max assist, +2 for physical assistance ?General bed mobility comments: session completed supine at bed level ?Transfers ?Overall transfer level: Needs assistance ?Equipment used: 1 person hand held assist ?Transfers: Sit to/from Stand, Bed to chair/wheelchair/BSC ?Sit to Stand: Mod assist, Max assist ?Bed to/from chair/wheelchair/BSC transfer type:: Step pivot ?Stand pivot transfers: Max assist ?Step pivot transfers: Max assist ?General transfer comment: pt able to initiate steps with left foot for step-pivot with PT assist to weight shift to right side. Pt continues to require significant physical assistance to stand, with L knee block. Emphasis on increasing trunk flexion prior to attempting to rise. Pt stands 5 times with 2 step pivot transfers ?Ambulation/Gait ?Pre-gait activities: lateral weight shifts in standing, pt utilizing UE support of RUE to produce shift. PT with knee block, pt tending to lean to left in standing ?  ?ADL: ?ADL ?Overall ADL's : Needs assistance/impaired ?Eating/Feeding: Sitting, Minimal assistance ?Eating/Feeding Details (indicate cue type and reason): supported in sitting ?Grooming: Minimal assistance, Sitting ?Upper Body Bathing: Maximal assistance, Sitting ?Lower Body  Bathing: Maximal assistance, +2 for physical assistance, +2 for safety/equipment, Sit to/from stand ?Upper Body Dressing : Moderate assistance, Sitting ?Lower Body Dressing: Maximal assistance, +2 for physical assi

## 2022-03-07 NOTE — Progress Notes (Signed)
Inpatient Rehabilitation Admission Medication Review by a Pharmacist ? ?A complete drug regimen review was completed for this patient to identify any potential clinically significant medication issues. ? ?High Risk Drug Classes Is patient taking? Indication by Medication  ?Antipsychotic Yes Compazine- N/V  ?Anticoagulant No   ?Antibiotic No   ?Opioid Yes Norco, Dilaudid- acute pain  ?Antiplatelet No   ?Hypoglycemics/insulin No   ?Vasoactive Medication Yes Labetalol- hypertension  ?Chemotherapy No   ?Other Yes Lexapro- MDD ?Pepcid / Protonix- GERD ?Gabapentin- neuropathic pain ?Lamictal, Keppra- seizure prophylaxis ?Crestor- HLD  ? ? ? ?Type of Medication Issue Identified Description of Issue Recommendation(s)  ?Drug Interaction(s) (clinically significant) ?    ?Duplicate Therapy ?    ?Allergy ?    ?No Medication Administration End Date ?    ?Incorrect Dose ?    ?Additional Drug Therapy Needed ?    ?Significant med changes from prior encounter (inform family/care partners about these prior to discharge).    ?Other ?    ? ? ?Clinically significant medication issues were identified that warrant physician communication and completion of prescribed/recommended actions by midnight of the next day:  No ? ? ?Time spent performing this drug regimen review (minutes):  30 ? ? ?Hermina Barnard BS, PharmD, BCPS ?Clinical Pharmacist ?03/07/2022 3:22 PM ? ?Contact: [phone] after 3 PM ? ?"Be curious, not judgmental..." -Jamal Maes ?

## 2022-03-07 NOTE — Discharge Summary (Addendum)
Physician Discharge Summary  Patient ID: Patrick Buchanan MRN: [id number] DOB/AGE: 69-02-1951 69 y.o.  Admit date: 03/07/2022 Discharge date: 03/30/2022  Discharge Diagnoses:  Principal Problem:   Meningioma [facility]) Active problems: Functional deficits secondary to parasagittal meningioma status post resection Left hemiparesis Dyslipidemia Gastroesophageal reflux disease Diabetes mellitus type 2 Anemia Constipation Left posterior tibial DVT Constipation LLE spasticity  Discharged Condition: stable  Significant Diagnostic Studies: Narrative & Impression  CLINICAL DATA:  Constipation   EXAM: ABDOMEN - 1 VIEW   COMPARISON:  None Available.   FINDINGS: Bowel gas pattern is nonspecific. No abnormal masses or calcifications are seen. Kidneys are partly obscured by bowel contents. There is no evidence of any significant amount of stool in the colon. Degenerative changes are noted in the lumbar spine.   IMPRESSION: Nonspecific bowel gas pattern. There is no evidence of any significant amount of stool in colon.     Electronically Signed   By: Elmer Picker M.D.   On: 03/23/2022 11:54     Labs:  Basic Metabolic Panel: Recent Labs  Lab 03/28/22 0530  NA 141  K 4.0  CL 106  CO2 29  GLUCOSE 111*  BUN 14  CREATININE 0.75  CALCIUM 8.7*    CBC: Recent Labs  Lab 03/24/22 0548 03/28/22 0530  WBC 4.7 4.2  HGB 10.4* 10.4*  HCT 32.5* 31.4*  MCV 96.2 94.6  PLT 162 188    CBG: No results for input(s): "GLUCAP" in the last 168 hours.  Brief HPI:   Patrick Buchanan is a 71 y.o. male with a history of spinal meningioma who presented with symptomatic multiple cranial meningiomas.  He presented to the hospital on 5/8 and underwent bilateral parietal craniotomy with resection of parasagittal tumor.    Hospital Course: MARC LEICHTER was admitted to rehab 03/07/2022 for inpatient therapies to consist of PT, ST and OT at least three hours five days a  week. Past admission physiatrist, therapy team and rehab RN have worked together to provide customized collaborative inpatient rehab. Neurontin 100 mg BID for neuropathic pain. LLE spasticity and increased tone. Splint to LLE overnight and baclofen 5 mg TID started on 5/17.  LLE venous duplex consistent with DVT of posterior tibial vein. Started on Lovenox 40 mg BID after confirming dosing with Dr. Annette Stable.  Staples removed on 5/18. Steroid dosing tapered and decreased to every eight hours on 5/19 oral form. Constipation bothersome and given enema and changed MoM to Miralax on 5/19 and magnesium gluconate added at bedtime. Baclofen increased to 10 mg TID. Repeat LLE venous duplex on 5/24 unchanged. Left heel cord less tight. E-stim therapy to left lower leg per OT.  Milk of magnesia added to nighttime regimen for constipation. Steroid therapy completed on 5/29. Required disimpaction and SMOG enema on 5/31 and 6/1 and follow-up x-ray without significant amount of stool in colon. Linzess started on 6/1 with discontinuation of MoM and Miralax. Low dose senna-S continued. Required Fleets enema on 6/6. Increased senna to 2 tablets at bedtime.  Blood pressures were monitored on TID basis and magnesium gluconate 250 mg started q HS on 5/20.   Diabetes has been monitored with some elevation of CBGs secondary to steroids. Diet controlled.  Rehab course: During patient's stay in rehab weekly team conferences were held to monitor patient's progress, set goals and discuss barriers to discharge. At admission, patient required max assist with mobility, mod to max assist with ADLs and total assist for lower body dressing.  He has  had improvement in activity tolerance, balance, postural control as well as ability to compensate for deficits. He has had improvement in functional use RUE/LUE  and RLE/LLE as well as improvement in awareness Splint required for LLE and baclofen for spacticity.   Vitamin D and D3 supplements  prescribed to patient's pharmacy at time of discharge.  Disposition: Home Discharge disposition: 01-Home or Self Care      Diet: Carb modified  Special Instructions:  No driving, alcohol consumption or tobacco use.   30-35 minutes were spent on discharge planning and discharge summary.   Discharge Instructions     Ambulatory referral to Physical Medicine Rehab   Complete by: As directed    Hospital follow-up   Discharge patient   Complete by: As directed    Discharge disposition: 01-Home or Self Care   Discharge patient date: 03/30/2022      Allergies as of 03/30/2022       Reactions   Other Itching, Other (See Comments)   Hay fever- Itchy eyes, runny nose, congestion- no breathing issues, however        Medication List     STOP taking these medications    dexamethasone 2 MG tablet Commonly known as: DECADRON       TAKE these medications    baclofen 10 MG tablet Commonly known as: LIORESAL Take 1 tablet (10 mg total) by mouth 3 (three) times daily.   calcium carbonate 500 MG chewable tablet Commonly known as: TUMS - dosed in mg elemental calcium Chew 1 tablet (200 mg of elemental calcium total) by mouth 3 (three) times daily with meals.   chlorpheniramine 4 MG tablet Commonly known as: CHLOR-TRIMETON Take 4 mg by mouth 2 (two) times daily as needed for allergies.   diclofenac Sodium 1 % Gel Commonly known as: VOLTAREN Apply 2 g topically 3 (three) times daily.   escitalopram 20 MG tablet Commonly known as: LEXAPRO Take 1 tablet (20 mg total) by mouth daily. What changed: how much to take   gabapentin 100 MG capsule Commonly known as: NEURONTIN Take 1 capsule (100 mg total) by mouth 2 (two) times daily. What changed: when to take this   HYDROcodone-acetaminophen 5-325 MG tablet Commonly known as: NORCO/VICODIN Take 1 tablet by mouth every 4 (four) hours as needed for moderate pain.   lamoTRIgine 100 MG tablet Commonly known as:  LaMICtal Take 1 tablet (100 mg total) by mouth 2 (two) times daily. What changed: when to take this   levETIRAcetam 500 MG tablet Commonly known as: KEPPRA Take 1 tablet (500 mg total) by mouth 2 (two) times daily.   linaclotide 145 MCG Caps capsule Commonly known as: LINZESS Take 1 capsule (145 mcg total) by mouth daily before breakfast. Start taking on: March 31, 2022   magnesium gluconate 500 MG tablet Commonly known as: MAGONATE Take 0.5 tablets (250 mg total) by mouth at bedtime.   multivitamin tablet Take 1 tablet by mouth daily.   pantoprazole 40 MG tablet Commonly known as: PROTONIX Take 1 tablet (40 mg total) by mouth at bedtime.   polyvinyl alcohol 1.4 % ophthalmic solution Commonly known as: LIQUIFILM TEARS Place 1 drop into both eyes as needed for dry eyes.   rosuvastatin 20 MG tablet Commonly known as: Crestor Take 1 tablet (20 mg total) by mouth daily. What changed: when to take this   senna-docusate 8.6-50 MG tablet Commonly known as: Senokot-S Take 2 tablets by mouth at bedtime.   traZODone 50 MG tablet Commonly  known as: DESYREL Take 0.5-1 tablets (25-50 mg total) by mouth at bedtime as needed for sleep.   Vitamin D (Ergocalciferol) 1.25 MG (50000 UNIT) Caps capsule Commonly known as: DRISDOL Take one tablet wkly What changed:  how much to take how to take this when to take this   Vitamin D3 125 MCG (5000 UT) Caps Take 5,000 Units by mouth daily.        Follow-up Information     Earnie Larsson, MD. Call.   Specialty: Neurosurgery Why: Call tomorrow to make arrangements for hospital follow-up Contact information: [address] [place] [address] [phone]         Meredith Staggers, MD Follow up.   Specialty: Physical Medicine and Rehabilitation Why: office will call you to arrange your appt (sent) Contact information: [place] [address] [phone]         Lorrene Reid,  PA-C. Call.   Specialty: Physician Assistant Why: Call tomorrow to make arrangements for hospital follow-up Contact information: [address] [place] [address] [phone]                 Signed: Barbie Banner 03/30/2022, 10:20 AM

## 2022-03-07 NOTE — Progress Notes (Signed)
Inpatient Rehabilitation Admissions Coordinator  ? ?CIR bed is available today.I have notified Dr Annette Stable, patient and his daughter, [name]. I will alert acute team and TOC and make the arrangements. ? ?Danne Baxter, RN, MSN ?Rehab Admissions Coordinator ?[phone] ?03/07/2022 8:27 AM ? ?

## 2022-03-07 NOTE — H&P (Incomplete)
? ? ?Physical Medicine and Rehabilitation Admission H&P ? ?  ?CC: Deficits secondary to right parasagittal region tumor s/p resection ? ?HPI: Patrick Buchanan is a 71 year old male with a history of spinal meningioma who presented with symptomatic multiple cranial meningiomas.  CT scan of the head revealed the largest tumor in the right parietal region as well as large left inferior frontal/sphenoid wing tumors.  The patient stated his symptoms have developed over many months and had a recent fall with bruising and reporting worsening gait.  He had significant left-sided weakness. Arrangements were made for admission for surgical resection.  On the day of admission the patient was taken to the operating room by Dr. Deri Fuelling and underwent bilateral parietal craniotomy with resection of parasagittal tumor greater than 6 cm in diameter, microdissection, partial craniectomy for resection of tumor invading through the skull.  He tolerated the procedure well.  He required intraoperative blood transfusion secondary to significant intraoperative blood loss and associated anemia.  Postoperative physical exam revealed minimal triceps and wrist extensor function, proximal function of left upper extremity.  He had some voluntary movement of the left lower extremity but no functional use per exam on 5/16.  Vital signs were stable and he was afebrile.  Steroids were weaned.  PRAFO boot for left foot and ankle and Velcro wrist splint for left upper extremity. The patient requires inpatient physical medicine and rehabilitation evaluations and treatment secondary to dysfunction due to right parasagittal region tumor s/p resection. ? ?The patient has a history of paraspinal meningioma resection.  He is followed as an outpatient by Dr. Marcial Pacas.  His medical history is significant for prior history of aneurysm clipping 1992, depression, diabetes type 2, gastroesophageal reflux disease, dyslipidemia. ? ?Review of Systems   ?Constitutional:  Negative for chills and fever.  ?HENT:  Negative for congestion and sore throat.   ?     Hoarseness since surgery (attributed to intubation)  ?Eyes:  Negative for double vision.  ?     Left eye dryness  ?Respiratory:  Negative for cough and sputum production.   ?Cardiovascular:  Negative for chest pain and palpitations.  ?Gastrointestinal:  Negative for nausea and vomiting.  ?     Lower abd gas pain  ?Genitourinary:  Positive for frequency and urgency. Negative for dysuria.  ?     Condom cath in place  ?Musculoskeletal:  Negative for neck pain.  ?     Left shoulder stiffness and pain. Head draws forward. Neck pillow helps. Complaining of left leg spasm.  ?Neurological:  Positive for focal weakness. Negative for dizziness.  ?Psychiatric/Behavioral:  Positive for depression.   ?     Takes Lexapro with good effect  ?Past Medical History:  ?Diagnosis Date  ? Allergy   ? hay fever  ? Blood transfusion without reported diagnosis   ? Cerebral aneurysm 1992  ? "leaking"; treated by Dr Sherwood Gambler  ? Depression   ? Diabetes mellitus without complication ([place])   ? type 2  ? GERD (gastroesophageal reflux disease)   ? past hx   ? ?Past Surgical History:  ?Procedure Laterality Date  ? APPENDECTOMY    ? CEREBRAL ANEURYSM REPAIR  1992  ? clipping by Dr Sherwood Gambler; cannot have an MRI  ? CRANIOTOMY Bilateral 02/27/2022  ? Procedure: Craniotomy - bilateral - Parietal;  Surgeon: Earnie Larsson, MD;  Location: [facility];  Service: Neurosurgery;  Laterality: Bilateral;  ? HERNIA REPAIR N/A 1990  ? inguinal  ? INGUINAL HERNIA REPAIR    ?  as a child and then another at 13 yrs old  ? TONSILLECTOMY    ? TUMOR EXCISION  2005  ? benign cervical  ? ?Family History  ?Problem Relation Age of Onset  ? Breast cancer Mother   ? Diabetes Brother   ? Colon cancer Neg Hx   ? Colon polyps Neg Hx   ? Esophageal cancer Neg Hx   ? Rectal cancer Neg Hx   ? Stomach cancer Neg Hx   ? ?Social History:  reports that he quit smoking about 18 years ago. His  smoking use included cigarettes. He has a 10.00 pack-year smoking history. He has never used smokeless tobacco. He reports that he does not currently use alcohol. He reports that he does not currently use drugs. ?Allergies: No Known Allergies ?Medications Prior to Admission  ?Medication Sig Dispense Refill  ? chlorpheniramine (CHLOR-TRIMETON) 4 MG tablet Take 4 mg by mouth 2 (two) times daily as needed for allergies.    ? Cholecalciferol 125 MCG (5000 UT) TABS Take 5,000 Units by mouth daily.    ? dexamethasone (DECADRON) 2 MG tablet Take 2 mg by mouth 2 (two) times daily.    ? escitalopram (LEXAPRO) 20 MG tablet TAKE 1 TABLET (20 MG TOTAL) BY MOUTH DAILY. 90 tablet 1  ? gabapentin (NEURONTIN) 100 MG capsule TAKE 1 CAPSULE BY MOUTH 2 TIMES DAILY. 180 capsule 0  ? lamoTRIgine (LAMICTAL) 100 MG tablet Take 1 tablet (100 mg total) by mouth 2 (two) times daily. 60 tablet 11  ? Multiple Vitamin (MULTIVITAMIN) tablet Take 1 tablet by mouth daily.    ? naphazoline-pheniramine (VISINE) 0.025-0.3 % ophthalmic solution Place 1 drop into both eyes 4 (four) times daily as needed for eye irritation.    ? polyvinyl alcohol (LIQUIFILM TEARS) 1.4 % ophthalmic solution Place 1 drop into both eyes as needed for dry eyes.    ? rosuvastatin (CRESTOR) 20 MG tablet Take 1 tablet (20 mg total) by mouth daily. 90 tablet 3  ? Vitamin D, Ergocalciferol, (DRISDOL) 1.25 MG (50000 UNIT) CAPS capsule Take one tablet wkly (Patient taking differently: Take 50,000 Units by mouth every Sunday. Take one tablet wkly) 12 capsule 3  ? ? ? ? ?Home: ?Home Living ?Family/patient expects to be discharged to:: Private residence ?Living Arrangements: Alone ?Available Help at Discharge: Family ?Type of Home: House ?Home Access: Stairs to enter ?Entrance Stairs-Number of Steps: 3 ?Entrance Stairs-Rails: Left ?Home Layout: Able to live on main level with bedroom/bathroom, Two level ?Bathroom Shower/Tub: Tub/shower unit ?Bathroom Toilet: Standard ?Bathroom  Accessibility: Yes ?Home Equipment: Cane - single point, [employer] (2 wheels) ? Lives With: Alone ?  ?Functional History: ?Prior Function ?Prior Level of Function : Independent/Modified Independent, Driving ?Mobility Comments: Retired Midwife ?ADLs Comments: indep, reports difficulty with socks. drives. sponge bathes at baseline ? ?Functional Status:  ?Mobility: ?Bed Mobility ?Overal bed mobility: Needs Assistance ?Bed Mobility: Supine to Sit ?Rolling: Mod assist, Max assist ?Supine to sit: Mod assist, HOB elevated ?Sit to supine: Max assist, +2 for physical assistance ?General bed mobility comments: session completed supine at bed level ?Transfers ?Overall transfer level: Needs assistance ?Equipment used: 1 person hand held assist ?Transfers: Sit to/from Stand, Bed to chair/wheelchair/BSC ?Sit to Stand: Mod assist, Max assist ?Bed to/from chair/wheelchair/BSC transfer type:: Step pivot ?Stand pivot transfers: Max assist ?Step pivot transfers: Max assist ?General transfer comment: pt able to initiate steps with left foot for step-pivot with PT assist to weight shift to right side. Pt continues to  require significant physical assistance to stand, with L knee block. Emphasis on increasing trunk flexion prior to attempting to rise. Pt stands 5 times with 2 step pivot transfers ?Ambulation/Gait ?Pre-gait activities: lateral weight shifts in standing, pt utilizing UE support of RUE to produce shift. PT with knee block, pt tending to lean to left in standing ?  ? ?ADL: ?ADL ?Overall ADL's : Needs assistance/impaired ?Eating/Feeding: Sitting, Minimal assistance ?Eating/Feeding Details (indicate cue type and reason): supported in sitting ?Grooming: Minimal assistance, Sitting ?Upper Body Bathing: Maximal assistance, Sitting ?Lower Body Bathing: Maximal assistance, +2 for physical assistance, +2 for safety/equipment, Sit to/from stand ?Upper Body Dressing : Moderate assistance, Sitting ?Lower Body Dressing: Maximal  assistance, +2 for physical assistance, +2 for safety/equipment, Sit to/from stand ?Toilet Transfer: Maximal assistance, +2 for physical assistance, +2 for safety/equipment, Stand-pivot, BSC/3in1 ?Toileting- Clot

## 2022-03-07 NOTE — Progress Notes (Signed)
Physical Therapy Treatment ?Patient Details ?Name: Patrick Buchanan ?MRN: [id number] ?DOB: 18-May-1951 ?Today's Date: 03/07/2022 ? ? ?History of Present Illness Pt is a 71 y.o. 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 Significant PMH: cerebral aneurysm, DM. ? ?  ?PT Comments  ? ? Pt admitted with above diagnosis. Pt was able to pivot with mod to max assist with pt working hard to assist with transfer. Sitting balance  improving daily with pt able to sit for brief periods today with min guard assist and cues. Pt currently with functional limitations due to balance and endurance deficits. Pt will benefit from skilled PT to increase their independence and safety with mobility to allow discharge to the venue listed below.      ?Recommendations for follow up therapy are one component of a multi-disciplinary discharge planning process, led by the attending physician.  Recommendations may be updated based on patient status, additional functional criteria and insurance authorization. ? ?Follow Up Recommendations ? Acute inpatient rehab (3hours/day) ?  ?  ?Assistance Recommended at Discharge Frequent or constant Supervision/Assistance  ?Patient can return home with the following Two people to help with walking and/or transfers;Two people to help with bathing/dressing/bathroom ?  ?Equipment Recommendations ? Wheelchair (measurements PT);Wheelchair cushion (measurements PT);BSC/3in1  ?  ?Recommendations for Other Services Rehab consult ? ? ?  ?Precautions / Restrictions Precautions ?Precautions: Fall;Other (comment) ?Precaution Comments: L hemi ?Restrictions ?Weight Bearing Restrictions: No  ?  ? ?Mobility ? Bed Mobility ?Overal bed mobility: Needs Assistance ?Bed Mobility: Supine to Sit ?Rolling: Mod assist, Max assist ?  ?Supine to sit: Mod assist, HOB elevated ?  ?  ?General bed mobility  comments: Pt rolls to left and needed mod assist for side to sit. Pt needed cues and assist for technique. ?  ? ?Transfers ?Overall transfer level: Needs assistance ?Equipment used: 2 person hand held assist ?Transfers: Sit to/from Stand, Bed to chair/wheelchair/BSC ?Sit to Stand: Mod assist, Max assist, +2 physical assistance ?Stand pivot transfers: Max assist, +2 physical assistance ?  ?  ?  ?  ?General transfer comment: pt able to initiate steps with right foot for step-pivot with PT assist to weight shift to left side. Pt continues to require significant physical assistance to stand, with L knee block. Emphasis on increasing trunk flexion prior to attempting to rise. ?  ? ?Ambulation/Gait ?  ?  ?  ?  ?  ?  ?Pre-gait activities: lateral weight shifts in standing, pt utilizing UE support of RUE to produce shift. PT with knee block, pt tending to lean to left in standing ?  ? ? ?Stairs ?  ?  ?  ?  ?  ? ? ?Wheelchair Mobility ?  ? ?Modified Rankin (Stroke Patients Only) ?Modified Rankin (Stroke Patients Only) ?Pre-Morbid Rankin Score: No symptoms ?Modified Rankin: Severe disability ? ? ?  ?Balance Overall balance assessment: Needs assistance ?Sitting-balance support: Single extremity supported, Feet supported ?Sitting balance-Leahy Scale: Poor ?Sitting balance - Comments: min-modA, left lateral lean, worked on pts postural stability with pt able to sit with min guard at times and then would lean left needing cues and assisst to correct.  Pt needs constant cues to work on upright neck and shoulders.  ?Postural control: Left lateral lean ?Standing balance support: Single extremity supported ?Standing balance-Leahy Scale: Poor ?Standing balance comment: maxA, left lateral lean ?  ?  ?  ?  ?  ?  ?  ?  ?  ?  ?  ?  ? ?  ?  Cognition Arousal/Alertness: Awake/alert ?Behavior During Therapy: [facility] for tasks assessed/performed ?Overall Cognitive Status: Impaired/Different from baseline ?Area of Impairment: Attention, Awareness,  Problem solving ?  ?  ?  ?  ?  ?  ?  ?  ?  ?Current Attention Level: Sustained ?  ?Following Commands: Follows one step commands consistently ?Safety/Judgement: Decreased awareness of safety, Decreased awareness of deficits ?Awareness: Emergent ?Problem Solving: Slow processing, Difficulty sequencing, Decreased initiation ?General Comments: decreased attention affecting postural control- good recall of brace wear/care ?  ?  ? ?  ?Exercises General Exercises - Lower Extremity ?Quad Sets: AROM, Left, 5 reps ?Heel Slides: PROM, Left, 5 reps, Supine ? ?  ?General Comments General comments (skin integrity, edema, etc.): VSS on RA ?  ?  ? ?Pertinent Vitals/Pain Pain Assessment ?Pain Assessment: No/denies pain  ? ? ?Home Living   ?  ?  ?  ?  ?  ?  ?  ?  ?  ?   ?  ?Prior Function    ?  ?  ?   ? ?PT Goals (current goals can now be found in the care plan section) Acute Rehab PT Goals ?Patient Stated Goal: for improved left sided strength ?Progress towards PT goals: Progressing toward goals ? ?  ?Frequency ? ? ? Min 4X/week ? ? ? ?  ?PT Plan Current plan remains appropriate  ? ? ?Co-evaluation   ?  ?  ?  ?  ? ?  ?AM-PAC PT "6 Clicks" Mobility   ?Outcome Measure ? Help needed turning from your back to your side while in a flat bed without using bedrails?: A Lot ?Help needed moving from lying on your back to sitting on the side of a flat bed without using bedrails?: A Lot ?Help needed moving to and from a bed to a chair (including a wheelchair)?: A Lot ?Help needed standing up from a chair using your arms (e.g., wheelchair or bedside chair)?: A Lot ?Help needed to walk in hospital room?: Total ?Help needed climbing 3-5 steps with a railing? : Total ?6 Click Score: 10 ? ?  ?End of Session Equipment Utilized During Treatment: Gait belt ?Activity Tolerance: Patient tolerated treatment well ?Patient left: in chair;with call bell/phone within reach;with chair alarm set ?Nurse Communication: Mobility status;Need for lift equipment ?PT  Visit Diagnosis: Hemiplegia and hemiparesis;Other abnormalities of gait and mobility (R26.89) ?Hemiplegia - Right/Left: Left ?Hemiplegia - dominant/non-dominant: Non-dominant ?Hemiplegia - caused by:  (brain mass resection) ?  ? ? ?Time: 0354-6568 ?PT Time Calculation (min) (ACUTE ONLY): 23 min ? ?Charges:  $Gait Training: 8-22 mins ?$Therapeutic Activity: 8-22 mins          ?          ? ?Aylen Rambert M,PT ?Acute Rehab Services ?[phone] ?[phone] (pager)  ? ? ?Maleik Vanderzee F Eurika Sandy ?03/07/2022, 12:26 PM ? ?

## 2022-03-08 ENCOUNTER — Inpatient Hospital Stay (HOSPITAL_COMMUNITY): Payer: Medicare HMO

## 2022-03-08 DIAGNOSIS — E1169 Type 2 diabetes mellitus with other specified complication: Secondary | ICD-10-CM | POA: Diagnosis not present

## 2022-03-08 DIAGNOSIS — G8114 Spastic hemiplegia affecting left nondominant side: Secondary | ICD-10-CM | POA: Diagnosis not present

## 2022-03-08 DIAGNOSIS — R609 Edema, unspecified: Secondary | ICD-10-CM

## 2022-03-08 DIAGNOSIS — D329 Benign neoplasm of meninges, unspecified: Secondary | ICD-10-CM | POA: Diagnosis not present

## 2022-03-08 DIAGNOSIS — K5901 Slow transit constipation: Secondary | ICD-10-CM

## 2022-03-08 DIAGNOSIS — E669 Obesity, unspecified: Secondary | ICD-10-CM

## 2022-03-08 LAB — CBC WITH DIFFERENTIAL/PLATELET
Abs Immature Granulocytes: 0.19 10*3/uL — ABNORMAL HIGH (ref 0.00–0.07)
Basophils Absolute: 0 10*3/uL (ref 0.0–0.1)
Basophils Relative: 0 %
Eosinophils Absolute: 0 10*3/uL (ref 0.0–0.5)
Eosinophils Relative: 0 %
HCT: 35.8 % — ABNORMAL LOW (ref 39.0–52.0)
Hemoglobin: 12.2 g/dL — ABNORMAL LOW (ref 13.0–17.0)
Immature Granulocytes: 2 %
Lymphocytes Relative: 5 %
Lymphs Abs: 0.6 10*3/uL — ABNORMAL LOW (ref 0.7–4.0)
MCH: 31.1 pg (ref 26.0–34.0)
MCHC: 34.1 g/dL (ref 30.0–36.0)
MCV: 91.3 fL (ref 80.0–100.0)
Monocytes Absolute: 0.8 10*3/uL (ref 0.1–1.0)
Monocytes Relative: 6 %
Neutro Abs: 10.3 10*3/uL — ABNORMAL HIGH (ref 1.7–7.7)
Neutrophils Relative %: 87 %
Platelets: 282 10*3/uL (ref 150–400)
RBC: 3.92 MIL/uL — ABNORMAL LOW (ref 4.22–5.81)
RDW: 13.7 % (ref 11.5–15.5)
WBC: 11.9 10*3/uL — ABNORMAL HIGH (ref 4.0–10.5)
nRBC: 0 % (ref 0.0–0.2)

## 2022-03-08 LAB — COMPREHENSIVE METABOLIC PANEL
ALT: 74 U/L — ABNORMAL HIGH (ref 0–44)
AST: 21 U/L (ref 15–41)
Albumin: 3 g/dL — ABNORMAL LOW (ref 3.5–5.0)
Alkaline Phosphatase: 56 U/L (ref 38–126)
Anion gap: 7 (ref 5–15)
BUN: 28 mg/dL — ABNORMAL HIGH (ref 8–23)
CO2: 27 mmol/L (ref 22–32)
Calcium: 8.8 mg/dL — ABNORMAL LOW (ref 8.9–10.3)
Chloride: 102 mmol/L (ref 98–111)
Creatinine, Ser: 0.81 mg/dL (ref 0.61–1.24)
GFR, Estimated: >60 mL/min (ref >60)
Glucose, Bld: 150 mg/dL — ABNORMAL HIGH (ref 70–99)
Potassium: 4.6 mmol/L (ref 3.5–5.1)
Sodium: 136 mmol/L (ref 135–145)
Total Bilirubin: 0.5 mg/dL (ref 0.3–1.2)
Total Protein: 5.7 g/dL — ABNORMAL LOW (ref 6.5–8.1)

## 2022-03-08 MED ORDER — MAGNESIUM HYDROXIDE NICU ORAL SYRINGE 400 MG/5 ML
10.0000 mL | Freq: Every day | ORAL | Status: DC
  Filled 2022-03-08: qty 10

## 2022-03-08 MED ORDER — DEXAMETHASONE 2 MG PO TABS
2.0000 mg | ORAL_TABLET | Freq: Four times a day (QID) | ORAL | Status: DC
  Administered 2022-03-08 – 2022-03-10 (×8): 2 mg via ORAL
  Filled 2022-03-08 (×8): qty 1

## 2022-03-08 MED ORDER — BACLOFEN 5 MG HALF TABLET
5.0000 mg | ORAL_TABLET | Freq: Three times a day (TID) | ORAL | Status: DC
  Administered 2022-03-08 – 2022-03-13 (×16): 5 mg via ORAL
  Filled 2022-03-08 (×16): qty 1

## 2022-03-08 MED ORDER — SORBITOL 70 % SOLN
60.0000 mL | Status: AC
  Administered 2022-03-08: 60 mL via ORAL
  Filled 2022-03-08: qty 60

## 2022-03-08 MED ORDER — MAGNESIUM HYDROXIDE 400 MG/5ML PO SUSP
10.0000 mL | Freq: Every day | ORAL | Status: DC
  Administered 2022-03-08 – 2022-03-10 (×3): 10 mL via ORAL
  Filled 2022-03-08 (×3): qty 30

## 2022-03-08 NOTE — Progress Notes (Addendum)
?                                                       PROGRESS NOTE ? ? ?Subjective/Complaints: ?Pt with a pretty good night. Got a little warm, but other than that ok. Wearing left wrist splint and PRAFO. Ready for therapies today. Having flatus but no bm since admit.  ? ?ROS: Patient denies fever, rash, sore throat, blurred vision, dizziness, nausea, vomiting, diarrhea, cough, shortness of breath or chest pain, joint or back/neck pain, headache, or mood change.  ? ? ?Objective: ?  ?No results found. ?Recent Labs  ?  03/08/22 ?0636  ?WBC 11.9*  ?HGB 12.2*  ?HCT 35.8*  ?PLT 282  ? ?Recent Labs  ?  03/08/22 ?0636  ?NA 136  ?K 4.6  ?CL 102  ?CO2 27  ?GLUCOSE 150*  ?BUN 28*  ?CREATININE 0.81  ?CALCIUM 8.8*  ? ?No intake or output data in the 24 hours ending 03/08/22 0834  ? ?  ? ?Physical Exam: ?Vital Signs ?Blood pressure 129/87, pulse 63, temperature 97.8 ?F (36.6 ?C), temperature source Oral, resp. rate 14, height 6' (1.829 m), weight 92.4 kg, SpO2 99 %. ? ?General: Alert and oriented x 3, No apparent distress ?HEENT: Head is normocephalic, atraumatic, PERRLA, EOMI, sclera anicteric, oral mucosa pink and moist, dentition intact, ext ear canals clear,  ?Neck: Supple without JVD or lymphadenopathy ?Heart: Reg rate and rhythm. No murmurs rubs or gallops ?Chest: CTA bilaterally without wheezes, rales, or rhonchi; no distress ?Abdomen: Soft, non-tender, non-distended, bowel sounds positive. ?Extremities: No clubbing, cyanosis, or edema. Pulses are 2+ ?Psych: Pt's affect is appropriate. Pt is cooperative ?Skin: Clean and intact without signs of breakdown ?Neuro:  Alert and oriented x 3. Normal insight and awareness. Intact Memory. Normal language and speech. Cranial nerve exam unremarkable. LUE 0-tr shoulder, bicep, tricep, 1-2/4 wrist and 2-2+/4 finger flexors. RUE 4-5/5. LLE with 1/5 HE, KE and trace at foot. RLE 4/5. Extensor tone present LLE and mild flexor tone in LUE. Seems to sense pain and LT in all four. DTR's  3+ LLE, 2+ LUE.  ?Musculoskeletal: left heel cord tight. No joint pain with ROM.  ? ? ?Assessment/Plan: ?1. Functional deficits which require 3+ hours per day of interdisciplinary therapy in a comprehensive inpatient rehab setting. ?Physiatrist is providing close team supervision and 24 hour management of active medical problems listed below. ?Physiatrist and rehab team continue to assess barriers to discharge/monitor patient progress toward functional and medical goals ? ?Care Tool: ? ?Bathing ?   ?   ?   ?  ?  ?Bathing assist Assist Level: Set up assist ?  ?  ?Upper Body Dressing/Undressing ?Upper body dressing   ?What is the patient wearing?: [place] only ?   ?Upper body assist Assist Level: Supervision/Verbal cueing ?   ?Lower Body Dressing/Undressing ?Lower body dressing ? ? ? Lower body dressing activity did not occur: N/A ?  ? ?  ? ?Lower body assist   ?   ? ?Toileting ?Toileting    ?Toileting assist Assist for toileting: Independent with assistive device ?Assistive Device Comment: urinal ?  ?Transfers ?Chair/bed transfer ? ?Transfers assist ? Chair/bed transfer activity did not occur: N/A ? ?  ?  ?  ?Locomotion ?Ambulation ? ? ?Ambulation assist ? ? Ambulation activity did not  occur: Refused ? ?  ?  ?   ? ?Walk 10 feet activity ? ? ?Assist ?   ? ?  ?   ? ?Walk 50 feet activity ? ? ?Assist   ? ?  ?   ? ? ?Walk 150 feet activity ? ? ?Assist   ? ?  ?  ?  ? ?Walk 10 feet on uneven surface  ?activity ? ? ?Assist   ? ? ?  ?   ? ?Wheelchair ? ? ? ? ?Assist Is the patient using a wheelchair?: Yes ?Type of Wheelchair: Manual ?Wheelchair activity did not occur: Refused ? ?  ?   ? ? ?Wheelchair 50 feet with 2 turns activity ? ? ? ?Assist ? ?  ?  ? ? ?   ? ?Wheelchair 150 feet activity  ? ? ? ?Assist ?   ? ? ?   ? ?Blood pressure 129/87, pulse 63, temperature 97.8 ?F (36.6 ?C), temperature source Oral, resp. rate 14, height 6' (1.829 m), weight 92.4 kg, SpO2 99 %. ? ?Medical Problem List and Plan: ?1. 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 ?            -patient may shower but keep incision covered ?            -ELOS/Goals:  ELOS 14 to 20 days ?           --Patient is beginning CIR therapies today including PT and OT, SLP ?2.  Antithrombotics: ?-DVT/anticoagulation:  Mechanical:  Antiembolism stockings, knee (TED hose) Bilateral lower extremities ?            -antiplatelet therapy: none ?3. Pain Management: Tylenol, hydrocodone, robaxin as needed ?            -Continue Gabapentin '100mg'$  BID ?4. Mood: LCSW to evaluate and provide emotional support ?            -antipsychotic agents: n/a ?            --Depression continue Lexapro 20 mg daily ?5. Neuropsych: This patient is capable of making decisions on his own behalf. ?6. Skin/Wound Care: Routine skin care checks ?            -- Monitor surgical incision ?7. Fluids/Electrolytes/Nutrition: encourage PO ? -BUN elevated today, recheck in morning ? -protein supp for low albumin ?9: History of multiple meningiomas/s/p resection: ?            -- Taper steroid--change to PO form 5/17 ?  -leukocytosis secondary to steroid ?            -- Continue Keppra and Lamictal ?            -- Calcium carbonate tablets 3 times daily ?10: Dyslipidemia: Continue Crestor 20 mg daily ?11: History of GERD: Continue Protonix 40 mg daily ?12: DM-2: diet controlled (A1c = 5.7 on 02/22/2022) ?           -glucose elevated at 150 this morning but likely steroid effect ?13: Anemia/acute intra-op blood loss: follow-up CBC ?14. Constipation sorbitol, sse today ? =start mom tomorrow per home regimen ?15. Spasticity: will order night splint for LLE ? -begin trial of baclofen '5mg'$  tid for tone ? -splinting, rom with therapies  ?  ? ?LOS: ?1 days ?A FACE TO FACE EVALUATION WAS PERFORMED ? ?Meredith Staggers ?03/08/2022, 8:34 AM  ? ?  ?

## 2022-03-08 NOTE — Progress Notes (Signed)
Left lower extremity venous duplex completed. ?Refer to "CV Proc" under chart review to view preliminary results. ? ?Preliminary results discussed with Reesa Chew, PA-C. ? ?03/08/2022 4:47 PM ?Kelby Aline., MHA, RVT, RDCS, RDMS   ?

## 2022-03-08 NOTE — Progress Notes (Signed)
Inpatient Rehabilitation  Patient information reviewed and entered into eRehab system by [name], OTR/L.   Information including medical coding, functional ability and quality indicators will be reviewed and updated through discharge.    

## 2022-03-08 NOTE — Plan of Care (Signed)
?  Problem: Consults ?Goal: RH BRAIN INJURY PATIENT EDUCATION ?Description: Description: See Patient Education module for eduction specifics ?Outcome: Progressing ?Goal: Skin Care Protocol Initiated - if Braden Score 18 or less ?Description: If consults are not indicated, leave blank or document N/A ?Outcome: Progressing ?  ?Problem: RH BOWEL ELIMINATION ?Goal: RH STG MANAGE BOWEL WITH ASSISTANCE ?Description: STG Manage Bowel with [place]. ?Outcome: Progressing ?Goal: RH STG MANAGE BOWEL W/MEDICATION W/ASSISTANCE ?Description: STG Manage Bowel with Medication with [place]. ?Outcome: Progressing ?  ?Problem: RH BLADDER ELIMINATION ?Goal: RH STG MANAGE BLADDER WITH ASSISTANCE ?Description: STG Manage Bladder With Min Assistance ?Outcome: Progressing ?Goal: RH STG MANAGE BLADDER WITH MEDICATION WITH ASSISTANCE ?Description: STG Manage Bladder With Medication With [place]. ?Outcome: Progressing ?  ?Problem: RH SKIN INTEGRITY ?Goal: RH STG MAINTAIN SKIN INTEGRITY WITH ASSISTANCE ?Description: STG Maintain Skin Integrity With [employer]. ?Outcome: Progressing ?Goal: RH STG ABLE TO PERFORM INCISION/WOUND CARE W/ASSISTANCE ?Description: STG Able To Perform Incision/Wound Care With [facility]. ?Outcome: Progressing ?  ?Problem: RH SAFETY ?Goal: RH STG ADHERE TO SAFETY PRECAUTIONS W/ASSISTANCE/DEVICE ?Description: STG Adhere to Safety Precautions With Cues and Reminders. ?Outcome: Progressing ?Goal: RH STG DECREASED RISK OF FALL WITH ASSISTANCE ?Description: STG Decreased Risk of Fall With [employer]. ?Outcome: Progressing ?  ?Problem: RH COGNITION-NURSING ?Goal: RH STG USES MEMORY AIDS/STRATEGIES W/ASSIST TO PROBLEM SOLVE ?Description: STG Uses Memory Aids/Strategies With Min Assistance to Problem Solve. ?Outcome: Progressing ?Goal: RH STG ANTICIPATES NEEDS/CALLS FOR ASSIST W/ASSIST/CUES ?Description: STG Anticipates Needs/Calls for Assist With Cues and Reminders. ?Outcome: Progressing ?   ?Problem: RH PAIN MANAGEMENT ?Goal: RH STG PAIN MANAGED AT OR BELOW PT'S PAIN GOAL ?Description: < 3 on a 0-10 pain scale. ?Outcome: Progressing ?  ?Problem: RH KNOWLEDGE DEFICIT BRAIN INJURY ?Goal: RH STG INCREASE KNOWLEDGE OF SELF CARE AFTER BRAIN INJURY ?Description: Patient will demonstrate knowledge of self-care management, medication/pain management, skin/wound care management with educational materials and handouts provided by staff independently at discharge. ?Outcome: Progressing ?  ?

## 2022-03-08 NOTE — Evaluation (Signed)
Physical Therapy Assessment and Plan ? ?Patient Details  ?Name: Patrick Buchanan ?MRN: [id number] ?Date of Birth: 1951/02/06 ? ?PT Diagnosis: Abnormal posture, Abnormality of gait, Cognitive deficits, Difficulty walking, Hemiplegia non-dominant, Hypertonia, and Muscle weakness ?Rehab Potential: Good ?ELOS: 3-3.5 weeks  ? ?Today's Date: 03/08/2022 ?PT Individual Time: 1300-1420 ?PT Individual Time Calculation (min): 80 min   ? ?Hospital Problem: Principal Problem: ?  Meningioma ([place]) ? ? ?Past Medical History:  ?Past Medical History:  ?Diagnosis Date  ? Allergy   ? hay fever  ? Blood transfusion without reported diagnosis   ? Cerebral aneurysm 1992  ? "leaking"; treated by Dr Sherwood Gambler  ? Depression   ? Diabetes mellitus without complication ([place])   ? type 2  ? GERD (gastroesophageal reflux disease)   ? past hx   ? ?Past Surgical History:  ?Past Surgical History:  ?Procedure Laterality Date  ? APPENDECTOMY    ? CEREBRAL ANEURYSM REPAIR  1992  ? clipping by Dr Sherwood Gambler; cannot have an MRI  ? CRANIOTOMY Bilateral 02/27/2022  ? Procedure: Craniotomy - bilateral - Parietal;  Surgeon: Earnie Larsson, MD;  Location: [facility];  Service: Neurosurgery;  Laterality: Bilateral;  ? HERNIA REPAIR N/A 1990  ? inguinal  ? INGUINAL HERNIA REPAIR    ? as a child and then another at 43 yrs old  ? TONSILLECTOMY    ? TUMOR EXCISION  2005  ? benign cervical  ? ? ?Assessment & Plan ?Clinical Impression: Patient is a 71 y.o. year old male with a history of spinal meningioma who presented with symptomatic multiple cranial meningiomas.  CT scan of the head revealed the largest tumor in the right parietal region as well as large left inferior frontal/sphenoid wing tumors.  The patient stated his symptoms have developed over many months and had a recent fall with bruising and reporting worsening gait.  He had significant left-sided weakness. Arrangements were made for admission for surgical resection.  On the day of admission the patient was taken to  the operating room by Dr. Deri Fuelling and underwent bilateral parietal craniotomy with resection of parasagittal tumor greater than 6 cm in diameter, microdissection, partial craniectomy for resection of tumor invading through the skull.  He tolerated the procedure well.  He required intraoperative blood transfusion secondary to significant intraoperative blood loss and associated anemia.  Postoperative physical exam revealed minimal triceps and wrist extensor function, proximal function of left upper extremity.  He had some voluntary movement of the left lower extremity but no functional use per exam on 5/16.  Vital signs were stable and he was afebrile.  Steroids were weaned.  PRAFO boot for left foot and ankle and Velcro wrist splint for left upper extremity. The patient requires inpatient physical medicine and rehabilitation evaluations and treatment secondary to dysfunction due to right parasagittal region tumor s/p resection. Patient transferred to CIR on 03/07/2022 .  ? ?Patient currently requires max with mobility secondary to muscle weakness and muscle joint tightness, decreased cardiorespiratoy endurance, abnormal tone, decreased coordination, and decreased motor planning, decreased midline orientation and decreased attention to left, decreased attention, decreased awareness, decreased problem solving, and decreased memory, and decreased sitting balance, decreased standing balance, decreased postural control, hemiplegia, and decreased balance strategies.  Prior to hospitalization, patient was modified independent  with mobility and lived with Alone in a House home.  Home access is 3Stairs to enter. ? ?Patient will benefit from skilled PT intervention to maximize safe functional mobility, minimize fall risk, and decrease caregiver burden  for planned discharge home with intermittent assist.  Anticipate patient will benefit from follow up OP at discharge. ? ?PT - End of Session ?Activity Tolerance: Tolerates  30+ min activity with multiple rests ?Endurance Deficit: Yes ?Endurance Deficit Description: required incraeased rest breaks and notable signs of fatigue with mobility ?PT Assessment ?Rehab Potential (ACUTE/IP ONLY): Good ?PT Barriers to Discharge: Inaccessible home environment;Decreased caregiver support;Lack of/limited family support;Behavior ?PT Patient demonstrates impairments in the following area(s): Balance;Perception;Safety;Behavior;Edema;Sensory;Skin Integrity;Endurance;Motor;Nutrition;Pain ?PT Transfers Functional Problem(s): Bed Mobility;Bed to Chair;Car;Furniture ?PT Locomotion Functional Problem(s): Ambulation;Wheelchair Mobility;Stairs ?PT Plan ?PT Intensity: Minimum of 1-2 x/day ,45 to 90 minutes ?PT Frequency: 5 out of 7 days ?PT Duration Estimated Length of Stay: 3-3.5 weeks ?PT Treatment/Interventions: Ambulation/gait training;Cognitive remediation/compensation;Discharge planning;DME/adaptive equipment instruction;Functional mobility training;Pain management;Psychosocial support;Splinting/orthotics;Therapeutic Activities;UE/LE Strength taining/ROM;Visual/perceptual remediation/compensation;Wheelchair propulsion/positioning;Therapeutic Exercise;UE/LE Coordination activities;Stair training;Skin care/wound management;Patient/family education;Neuromuscular re-education;Functional electrical stimulation;Disease management/prevention;Community reintegration;Balance/vestibular training ?PT Transfers Anticipated Outcome(s): CGA using LRAD ?PT Locomotion Anticipated Outcome(s): min A >100 using LRAD ?PT Recommendation ?Recommendations for Other Services: Neuropsych consult ?Follow Up Recommendations: Outpatient PT ?Patient destination: Home ?Equipment Recommended: To be determined ? ? ?PT Evaluation ?Precautions/Restrictions ?Precautions ?Precautions: Fall;Other (comment) ?Precaution Comments: L hemi ?Required Braces or Orthoses: Other Brace (Wrist splint and PRAFO) ?Other Brace: Wrist splint and  PRAFO ?Restrictions ?Weight Bearing Restrictions: No ?General ?  Vital SignsTherapy Vitals ?Temp: 98.2 ?F (36.8 ?C) ?Pulse Rate: 88 ?Resp: 16 ?BP: 130/90 ?Patient Position (if appropriate): Lying ?Oxygen Therapy ?SpO2: 95 % ?O2 Device: Room Air ?Pain ?Pain Assessment ?Pain Score: 0-No pain ?Pain Interference ?Pain Interference ?Pain Effect on Sleep: 1. Rarely or not at all ?Pain Interference with Therapy Activities: 1. Rarely or not at all ?Pain Interference with Day-to-Day Activities: 1. Rarely or not at all ?Home Living/Prior Functioning ?Home Living ?Available Help at Discharge: Family ?Type of Home: House ?Home Access: Stairs to enter ?Entrance Stairs-Number of Steps: 3 ?Entrance Stairs-Rails: Left ?Home Layout: Able to live on main level with bedroom/bathroom;Two level ?Bathroom Shower/Tub:  (sliding doors) ? Lives With: Alone ?Prior Function ?Level of Independence: Independent with basic ADLs;Independent with gait;Independent with transfers ? Able to Take Stairs?: Yes ?Driving: Yes ?Vocation: Retired ?Vocation Requirements: spends time with his dog ?Vision/Perception  ?Vision - History ?Ability to See in Adequate Light: 0 Adequate ?Perception ?Perception: Impaired ?Inattention/Neglect: Does not attend to left side of body;Impaired-to be further tested in functional context ?Praxis ?Praxis: Impaired ?Praxis Impairment Details: Motor planning  ?Cognition ?Overall Cognitive Status: Impaired/Different from baseline ?Arousal/Alertness: Awake/alert ?Orientation Level: Oriented X4 ?Year: 2023 ?Month: May ?Attention: Selective;Alternating ?Selective Attention: Impaired ?Selective Attention Impairment: Verbal complex;Functional complex ?Alternating Attention: Impaired ?Alternating Attention Impairment: Verbal complex;Functional complex ?Memory: Appears intact ?Awareness: Impaired ?Awareness Impairment: Emergent impairment;Anticipatory impairment ?Problem Solving: Impaired ?Problem Solving Impairment: Functional  complex ?Safety/Judgment: Appears intact ?Sensation ?Sensation ?Light Touch: Appears Intact ?Proprioception: Impaired Detail ?Proprioception Impaired Details: Impaired LLE;Impaired LUE ?Coordination ?Gross Oceanographer

## 2022-03-08 NOTE — Progress Notes (Signed)
Left posterior tibial vein thrombus on venous duplex. Discussed with Dr. Naaman Plummer and patient. Plan to follow up with Dr. Annette Stable tomorrow to discuss safety of starting Lovenox and follow-up duplex in 5-7 days. ?

## 2022-03-08 NOTE — Evaluation (Signed)
Occupational Therapy Assessment and Plan ? ?Patient Details  ?Name: Patrick Buchanan ?MRN: [id number] ?Date of Birth: Oct 20, 1951 ? ?OT Diagnosis: abnormal posture, cognitive deficits, disturbance of vision, hemiplegia affecting non-dominant side, muscle weakness (generalized), and pain in joint ?Rehab Potential: Rehab Potential (ACUTE ONLY): Good ?ELOS: 3-3.5 weeks  ? ?Today's Date: 03/08/2022 ?OT Individual Time: 0630-1601 ?OT Individual Time Calculation (min): 75 min    ? ?Hospital Problem: Principal Problem: ?  Meningioma ([place]) ? ? ?Past Medical History:  ?Past Medical History:  ?Diagnosis Date  ? Allergy   ? hay fever  ? Blood transfusion without reported diagnosis   ? Cerebral aneurysm 1992  ? "leaking"; treated by Dr Sherwood Gambler  ? Depression   ? Diabetes mellitus without complication ([place])   ? type 2  ? GERD (gastroesophageal reflux disease)   ? past hx   ? ?Past Surgical History:  ?Past Surgical History:  ?Procedure Laterality Date  ? APPENDECTOMY    ? CEREBRAL ANEURYSM REPAIR  1992  ? clipping by Dr Sherwood Gambler; cannot have an MRI  ? CRANIOTOMY Bilateral 02/27/2022  ? Procedure: Craniotomy - bilateral - Parietal;  Surgeon: Earnie Larsson, MD;  Location: [facility];  Service: Neurosurgery;  Laterality: Bilateral;  ? HERNIA REPAIR N/A 1990  ? inguinal  ? INGUINAL HERNIA REPAIR    ? as a child and then another at 52 yrs old  ? TONSILLECTOMY    ? TUMOR EXCISION  2005  ? benign cervical  ? ? ?Assessment & Plan ?Clinical Impression: Pt is a 71 y.o. 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 Significant PMH: cerebral aneurysm, DM. ? ?Patient currently requires total with basic self-care skills secondary to muscle weakness, decreased cardiorespiratoy endurance, impaired timing and sequencing, abnormal tone, unbalanced muscle activation, motor apraxia, and decreased motor planning, decreased visual  acuity and decreased visual perceptual skills, decreased midline orientation and decreased attention to left, decreased attention, decreased awareness, decreased problem solving, decreased safety awareness, and decreased memory, and decreased sitting balance, decreased standing balance, decreased postural control, hemiplegia, and decreased balance strategies.  Prior to hospitalization, patient could complete BADL/ADL with min. ? ?Patient will benefit from skilled intervention to decrease level of assist with basic self-care skills and increase independence with basic self-care skills prior to discharge home with care partner.  Anticipate patient will require 24 hour supervision and minimal physical assistance and follow up home health. ? ?OT - End of Session ?Activity Tolerance: Tolerates 30+ min activity with multiple rests ?Endurance Deficit: Yes ?OT Assessment ?Rehab Potential (ACUTE ONLY): Good ?OT Barriers to Discharge: Decreased caregiver support;Home environment access/layout;Lack of/limited family support;Incontinence ?OT Patient demonstrates impairments in the following area(s): Balance;Cognition;Endurance;Motor;Nutrition;Pain;Safety;Sensory;Vision ?OT Basic ADL's Functional Problem(s): Eating;Grooming;Bathing;Dressing;Toileting ?OT Transfers Functional Problem(s): Toilet;Tub/Shower ?OT Additional Impairment(s): Fuctional Use of Upper Extremity ?OT Plan ?OT Intensity: Minimum of 1-2 x/day, 45 to 90 minutes ?OT Frequency: 5 out of 7 days ?OT Duration/Estimated Length of Stay: 3-3.5 weeks ?OT Treatment/Interventions: Environmental education officer education;Therapeutic Activities;Wheelchair propulsion/positioning;Therapeutic Exercise;Psychosocial support;Cognitive remediation/compensation;Community reintegration;Functional mobility training;UE/LE Strength taining/ROM;Self Care/advanced ADL retraining;UE/LE Coordination activities;Skin care/wound  managment;Neuromuscular re-education;Discharge planning;Disease mangement/prevention;Splinting/orthotics;Visual/perceptual remediation/compensation;Pain management ?OT Self Feeding Anticipated Outcome(s): S ?OT Basic Self-Care Anticipated Outcome(s): MIN ?OT Toileting Anticipated Outcome(s): MIN ?OT Bathroom Transfers Anticipated Outcome(s): MIN ?OT Recommendation ?Patient destination: Home ?Follow Up Recommendations: Home health OT ?Equipment Recommended: 3 in 1 bedside comode;Tub/shower bench;To be determined ? ? ?OT Evaluation ?Precautions/Restrictions  ?  Precautions ?Precautions: Fall;Other (comment) ?Precaution Comments: L hemi ?Restrictions ?Weight Bearing Restrictions: No ?General ?Chart Reviewed: Yes ?Family/Caregiver Present: No ?Vital Signs ?  ?Pain ?Pain Assessment ?Pain Location: Shoulder ?Pain Orientation: Left ?Pain Descriptors / Indicators: Aching ?Home Living/Prior Functioning ?Home Living ?Available Help at Discharge: Family ?Type of Home: House ?Home Access: Stairs to enter ?Entrance Stairs-Number of Steps: 3 ?Entrance Stairs-Rails: Left ?Home Layout: Able to live on main level with bedroom/bathroom, Two level ?Bathroom Shower/Tub: Tub/shower unit, Door (sliding doors) ?Bathroom Toilet: Standard ?Bathroom Accessibility: Yes ? Lives With: Alone ?IADL History ?Education: Law school ?Prior Function ?Vocation: Retired ?Vision ?Baseline Vision/History: 1 Wears glasses ?Ability to See in Adequate Light: 0 Adequate ?Patient Visual Report: No change from baseline ?Additional Comments: I see as well as I ever did ?Perception  ?Perception: Impaired ?Praxis ?Praxis: Impaired ?Cognition ?Cognition ?Overall Cognitive Status: Impaired/Different from baseline ?Arousal/Alertness: Awake/alert ?Orientation Level: Person;Place;Situation ?Person: Oriented ?Place: Oriented ?Situation: Oriented ?Memory: Appears intact ?Safety/Judgment: Appears intact ?Brief Interview for Mental Status (BIMS) ?Repetition of Three Words  (First Attempt): 3 ?Temporal Orientation: Year: Correct ?Temporal Orientation: Month: Accurate within 5 days ?Temporal Orientation: Day: Correct ?Recall: "Sock": Yes, no cue required ?Recall: "Blue": Yes, after cueing ("a color") ?Recall: "Bed": Yes, no cue required ?BIMS Summary Score: 14 ?Sensation ?Sensation ?Light Touch: Appears Intact ?Coordination ?Gross Motor Movements are Fluid and Coordinated: No ?Fine Motor Movements are Fluid and Coordinated: No ?Motor  ?Motor ?Motor: Abnormal postural alignment and control;Hemiplegia;Abnormal tone  ?Trunk/Postural Assessment  ?Cervical Assessment ?Cervical Assessment:  (head forward/limited neck ROM dt sx PTA 7 years ago) ?Thoracic Assessment ?Thoracic Assessment:  (rounded shoulders) ?Lumbar Assessment ?Lumbar Assessment:  (post pelvic tilt) ?Postural Control ?Postural Control: Deficits on evaluation (delayed/insuffucuent)  ?Balance ?Balance ?Balance Assessed: Yes ?Static Sitting Balance ?Static Sitting - Level of Assistance: 5: Stand by assistance;4: Min assist ?Dynamic Sitting Balance ?Dynamic Sitting - Level of Assistance: 4: Min assist;3: Mod assist ?Static Standing Balance ?Static Standing - Level of Assistance: 3: Mod assist;2: Max assist ?Extremity/Trunk Assessment ?RUE Assessment ?RUE Assessment: Within Functional Limits ?LUE Assessment ?LUE Assessment: Exceptions to [facility] ?LUE Body System: Neuro ?Brunstrum levels for arm and hand: Arm;Hand ?Brunstrum level for arm: Stage I Presynergy ?Brunstrum level for hand: Stage IV Movements deviating from synergies ? ?Care Tool ?Care Tool Self Care ?Eating   ?  ?   ?Oral Care    ?Oral Care Assist Level: Moderate Assistance - Patient 50 - 74% ?   ?Bathing   ?Body parts bathed by patient: Left arm;Chest;Abdomen;Front perineal area ?Body parts bathed by helper: Right arm;Buttocks;Right upper leg;Left upper leg;Right lower leg;Left lower leg ?  ?Assist Level: Maximal Assistance - Patient 24 - 49% ?   ?Upper Body  Dressing(including orthotics)   ?What is the patient wearing?: Pull over shirt ?  ?Assist Level: Maximal Assistance - Patient 25 - 49% ?   ?Lower Body Dressing (excluding footwear)   ?What is the patient wearing?: Pants ?Assist for lower b

## 2022-03-08 NOTE — Evaluation (Signed)
Speech Language Pathology Assessment and Plan ? ?Patient Details  ?Name: Patrick Buchanan ?MRN: [id number] ?Date of Birth: April 11, 1951 ? ?SLP Diagnosis: Cognitive Impairments  ?Rehab Potential: Good ?ELOS: 3-3.5 weeks  ? ? ?Today's Date: 03/08/2022 ?SLP Individual Time: 2229-7989 ?SLP Individual Time Calculation (min): 59 min ? ? ?Hospital Problem: Principal Problem: ?  Meningioma ([place]) ? ?Past Medical History:  ?Past Medical History:  ?Diagnosis Date  ? Allergy   ? hay fever  ? Blood transfusion without reported diagnosis   ? Cerebral aneurysm 1992  ? "leaking"; treated by Dr Sherwood Gambler  ? Depression   ? Diabetes mellitus without complication ([place])   ? type 2  ? GERD (gastroesophageal reflux disease)   ? past hx   ? ?Past Surgical History:  ?Past Surgical History:  ?Procedure Laterality Date  ? APPENDECTOMY    ? CEREBRAL ANEURYSM REPAIR  1992  ? clipping by Dr Sherwood Gambler; cannot have an MRI  ? CRANIOTOMY Bilateral 02/27/2022  ? Procedure: Craniotomy - bilateral - Parietal;  Surgeon: Earnie Larsson, MD;  Location: [facility];  Service: Neurosurgery;  Laterality: Bilateral;  ? HERNIA REPAIR N/A 1990  ? inguinal  ? INGUINAL HERNIA REPAIR    ? as a child and then another at 26 yrs old  ? TONSILLECTOMY    ? TUMOR EXCISION  2005  ? benign cervical  ? ? ?Assessment / Plan / Recommendation ?Clinical Impression  Patrick Buchanan is a 71 year old male with a history of spinal meningioma who presented with symptomatic multiple cranial meningiomas.  CT scan of the head revealed the largest tumor in the right parietal region as well as large left inferior frontal/sphenoid wing tumors.  The patient stated his symptoms have developed over many months and had a recent fall with bruising and reporting worsening gait.  He had significant left-sided weakness. Arrangements were made for admission for surgical resection.  On the day of admission the patient was taken to the operating room by Dr. Deri Fuelling and underwent bilateral parietal  craniotomy with resection of parasagittal tumor greater than 6 cm in diameter, microdissection, partial craniectomy for resection of tumor invading through the skull.  He tolerated the procedure well.  He required intraoperative blood transfusion secondary to significant intraoperative blood loss and associated anemia.  Postoperative physical exam revealed minimal triceps and wrist extensor function, proximal function of left upper extremity.  He had some voluntary movement of the left lower extremity but no functional use per exam on 5/16.  Vital signs were stable and he was afebrile.  Steroids were weaned.  PRAFO boot for left foot and ankle and Velcro wrist splint for left upper extremity. The patient requires inpatient physical medicine and rehabilitation evaluations and treatment secondary to dysfunction due to right parasagittal region tumor s/p resection. ?The patient has a history of paraspinal meningioma resection.  He is followed as an outpatient by Dr. Marcial Pacas.  His medical history is significant for prior history of aneurysm clipping 1992, depression, diabetes type 2, gastroesophageal reflux disease, dyslipidemia. ? ?Pt presents with moderate-mild cognitive impairments, deficits include complex problem solving/executive function, emergent, anticipatory awareness, selective and alternating attention. Formal cognitive linguistic assessment, CLQT, reflected mild deficits in attention, executive function and visuospatial skills. Memory was [facility]. Pt demonstrated possible left side inattention. Pt supports no acute changes to speech nor swallow function. Pt was independent prior to admission and living alone, therefore could benefit from skilled ST services in order to maximize functional independence and reduce burden of care, likely no  further ST services at d/c. ? ?  ?Skilled Therapeutic Interventions          Skilled ST services focused on cognitive skills. SLP facilitated administration of cognitive  linguistic formal assessment (CLQT) and provided education of results. SLP and pt collaborated to set goals for cognitive linguistic needs during length of stay. All questions answered to satisfaction.  Pt was left in room with call bell within reach and bed  alarm set. SLP recommends to continue skilled services.  ?  ?SLP Assessment ? Patient will need skilled Speech Lanaguage Pathology Services during CIR admission  ?  ?Recommendations ? Patient destination: Home ?Follow up Recommendations: None ?Equipment Recommended: None recommended by SLP  ?  ?SLP Frequency 3 to 5 out of 7 days   ?SLP Duration ? ?SLP Intensity ? ?SLP Treatment/Interventions 3-3.5 weeks ? ?Minumum of 1-2 x/day, 30 to 90 minutes ? ?Cognitive remediation/compensation;Cueing hierarchy;Functional tasks;Medication managment;Patient/family education   ? ?Pain ?Pain Assessment ?Pain Score: 0-No pain ? ?Prior Functioning ?Cognitive/Linguistic Baseline: Within functional limits ?Type of Home: House ? Lives With: Alone ?Available Help at Discharge: Family ?Education: Law school ?Vocation: Retired ? ?SLP Evaluation ?Cognition ?Overall Cognitive Status: Impaired/Different from baseline ?Arousal/Alertness: Awake/alert ?Orientation Level: Oriented X4 ?Attention: Selective;Alternating ?Selective Attention: Impaired ?Selective Attention Impairment: Verbal complex;Functional complex ?Alternating Attention: Impaired ?Alternating Attention Impairment: Verbal complex;Functional complex ?Memory: Appears intact ?Awareness: Impaired ?Awareness Impairment: Emergent impairment;Anticipatory impairment ?Problem Solving: Impaired ?Problem Solving Impairment: Functional complex;Verbal complex ?Safety/Judgment: Appears intact  ?Comprehension ?Auditory Comprehension ?Overall Auditory Comprehension: Appears within functional limits for tasks assessed ?Expression ?Expression ?Primary Mode of Expression: Verbal ?Verbal Expression ?Overall Verbal Expression: Appears within  functional limits for tasks assessed ?Oral Motor ?Oral Motor/Sensory Function ?Overall Oral Motor/Sensory Function: Within functional limits ?Motor Speech ?Overall Motor Speech: Appears within functional limits for tasks assessed ? ?Care Tool ?Care Tool Cognition ?Ability to hear (with hearing aid or hearing appliances if normally used Ability to hear (with hearing aid or hearing appliances if normally used): 0. Adequate - no difficulty in normal conservation, social interaction, listening to TV ?  ?Expression of Ideas and Wants Expression of Ideas and Wants: 4. Without difficulty (complex and basic) - expresses complex messages without difficulty and with speech that is clear and easy to understand ?  ?Understanding Verbal and Non-Verbal Content Understanding Verbal and Non-Verbal Content: 3. Usually understands - understands most conversations, but misses some part/intent of message. Requires cues at times to understand  ?Memory/Recall Ability Memory/Recall Ability : That he or she is in a hospital/hospital unit;Staff names and faces;Current season  ? ? ?Short Term Goals: ?Week 1: SLP Short Term Goal 1 (Week 1): Pt will complete complex problem solving tasks with min A verbal cues. ?SLP Short Term Goal 2 (Week 1): Pt will self-monitor and self-correct functional errors with min A verbal cues. ?SLP Short Term Goal 3 (Week 1): Pt will demonstrate selective attention in 30 minute intervals during complex tasks with supervision A verbal cues. ? ?Refer to Care Plan for Long Term Goals ? ?Recommendations for other services: None  ? ?Discharge Criteria: Patient will be discharged from SLP if patient refuses treatment 3 consecutive times without medical reason, if treatment goals not met, if there is a change in medical status, if patient makes no progress towards goals or if patient is discharged from hospital. ? ?The above assessment, treatment plan, treatment alternatives and goals were discussed and mutually agreed  upon: by patient ? ?Callin Ashe ?03/08/2022, 2:55 PM ? ? ?

## 2022-03-09 DIAGNOSIS — E1169 Type 2 diabetes mellitus with other specified complication: Secondary | ICD-10-CM | POA: Diagnosis not present

## 2022-03-09 DIAGNOSIS — K5901 Slow transit constipation: Secondary | ICD-10-CM | POA: Diagnosis not present

## 2022-03-09 DIAGNOSIS — G8114 Spastic hemiplegia affecting left nondominant side: Secondary | ICD-10-CM | POA: Diagnosis not present

## 2022-03-09 DIAGNOSIS — D329 Benign neoplasm of meninges, unspecified: Secondary | ICD-10-CM | POA: Diagnosis not present

## 2022-03-09 LAB — BASIC METABOLIC PANEL
Anion gap: 7 (ref 5–15)
BUN: 23 mg/dL (ref 8–23)
CO2: 25 mmol/L (ref 22–32)
Calcium: 8.7 mg/dL — ABNORMAL LOW (ref 8.9–10.3)
Chloride: 103 mmol/L (ref 98–111)
Creatinine, Ser: 0.76 mg/dL (ref 0.61–1.24)
GFR, Estimated: >60 mL/min (ref >60)
Glucose, Bld: 155 mg/dL — ABNORMAL HIGH (ref 70–99)
Potassium: 4.3 mmol/L (ref 3.5–5.1)
Sodium: 135 mmol/L (ref 135–145)

## 2022-03-09 MED ORDER — ENOXAPARIN SODIUM 40 MG/0.4ML IJ SOSY
40.0000 mg | PREFILLED_SYRINGE | Freq: Two times a day (BID) | INTRAMUSCULAR | Status: DC
  Administered 2022-03-09 – 2022-03-30 (×43): 40 mg via SUBCUTANEOUS
  Filled 2022-03-09 (×43): qty 0.4

## 2022-03-09 NOTE — Progress Notes (Signed)
Occupational Therapy Session Note  Patient Details  Name: Patrick Buchanan MRN: [id number] Date of Birth: 1951-03-03  Today's Date: 03/09/2022 OT Individual Time: 1224-8250 OT Individual Time Calculation (min): 60 min    Short Term Goals: Week 1:  OT Short Term Goal 1 (Week 1): Pt will sit dynamically at EOB or EOM for 10 min with no more than CGA for balance corrections OT Short Term Goal 2 (Week 1): Pt will complete toilet transfer with MAX A of 1 and LRAD OT Short Term Goal 3 (Week 1): Pt will sit to stand with MOD A Overall in prep for LB and toileting tasks OT Short Term Goal 4 (Week 1): Pt will recall hemi strategies with MIN cuing  Skilled Therapeutic Interventions/Progress Updates:    Pt received supine with no c/o pain, agreeable to OT session. Pt declined shower but was agreeable to ADLS EOB. Pt completed bed mobility to EOB with mod A for management of the LUE/LE. He required min A for sitting balance, L lean and mod cueing for midline orientation. He donned a shirt with min A , good awareness of the LUE. Wrist cock up splint on. He stood with the stedy with min A, more like min-mod to remain midline in perched. Used the mirror as visual cue for midline awareness. He required min A for sit > stand to spit into the sink during oral care. He transferred to the w/c via stedy. Max A to don pants. Mod-max A for sit > stand with HHA. He was taken to the therapy gym via w/c. Kinesiotape and other tape removed from his LUE. Skin red but intact beneath. He was able to complete 1x8 wrist extension, ~15 degrees, but quickly fatiguing. He also had active wrist flexion, 20 degrees. Pt passed off to PT in the gym.   Therapy Documentation Precautions:  Precautions Precautions: Fall, Other (comment) Precaution Comments: L hemi Required Braces or Orthoses: Other Brace (Wrist splint and PRAFO) Other Brace: Wrist splint and PRAFO Restrictions Weight Bearing Restrictions: No   Therapy/Group:  Individual Therapy  Curtis Sites 03/09/2022, 6:49 AM

## 2022-03-09 NOTE — Progress Notes (Signed)
63 Staples removed from patient's scalp per order. Pt does not complain of any pain at this time.

## 2022-03-09 NOTE — Plan of Care (Signed)
  Problem: Consults Goal: RH BRAIN INJURY PATIENT EDUCATION Description: Description: See Patient Education module for eduction specifics Outcome: Progressing Goal: Skin Care Protocol Initiated - if Braden Score 18 or less Description: If consults are not indicated, leave blank or document N/A Outcome: Progressing   Problem: RH BOWEL ELIMINATION Goal: RH STG MANAGE BOWEL WITH ASSISTANCE Description: STG Manage Bowel with [place]. Outcome: Progressing Goal: RH STG MANAGE BOWEL W/MEDICATION W/ASSISTANCE Description: STG Manage Bowel with Medication with [place]. Outcome: Progressing   Problem: RH BLADDER ELIMINATION Goal: RH STG MANAGE BLADDER WITH ASSISTANCE Description: STG Manage Bladder With Min Assistance Outcome: Progressing Goal: RH STG MANAGE BLADDER WITH MEDICATION WITH ASSISTANCE Description: STG Manage Bladder With Medication With [place]. Outcome: Progressing   Problem: RH SKIN INTEGRITY Goal: RH STG MAINTAIN SKIN INTEGRITY WITH ASSISTANCE Description: STG Maintain Skin Integrity With [place]. Outcome: Progressing Goal: RH STG ABLE TO PERFORM INCISION/WOUND CARE W/ASSISTANCE Description: STG Able To Perform Incision/Wound Care With [employer]. Outcome: Progressing   Problem: RH SAFETY Goal: RH STG ADHERE TO SAFETY PRECAUTIONS W/ASSISTANCE/DEVICE Description: STG Adhere to Safety Precautions With Cues and Reminders. Outcome: Progressing Goal: RH STG DECREASED RISK OF FALL WITH ASSISTANCE Description: STG Decreased Risk of Fall With [employer]. Outcome: Progressing   Problem: RH COGNITION-NURSING Goal: RH STG USES MEMORY AIDS/STRATEGIES W/ASSIST TO PROBLEM SOLVE Description: STG Uses Memory Aids/Strategies With Min Assistance to Problem Solve. Outcome: Progressing Goal: RH STG ANTICIPATES NEEDS/CALLS FOR ASSIST W/ASSIST/CUES Description: STG Anticipates Needs/Calls for Assist With Cues and Reminders. Outcome: Progressing    Problem: RH PAIN MANAGEMENT Goal: RH STG PAIN MANAGED AT OR BELOW PT'S PAIN GOAL Description: < 3 on a 0-10 pain scale. Outcome: Progressing   Problem: RH KNOWLEDGE DEFICIT BRAIN INJURY Goal: RH STG INCREASE KNOWLEDGE OF SELF CARE AFTER BRAIN INJURY Description: Patient will demonstrate knowledge of self-care management, medication/pain management, skin/wound care management with educational materials and handouts provided by staff independently at discharge. Outcome: Progressing

## 2022-03-09 NOTE — Progress Notes (Signed)
PROGRESS NOTE   Subjective/Complaints: Overall he's doing well. A little upset that nursing didn't respond to him enough re: voiding/urinal. Ended up wetting himself this morning. Worried about dvt  ROS: Patient denies fever, rash, sore throat, blurred vision, dizziness, nausea, vomiting, diarrhea, cough, shortness of breath or chest pain, joint or back/neck pain, headache, or mood change.    Objective:   VAS [place] LOWER EXTREMITY VENOUS (DVT)  Result Date: 03/08/2022  Lower Venous DVT Study Patient Name:  Patrick Buchanan  Date of Exam:   03/08/2022 Medical Rec #: [id number]            Accession #:    [id number] Date of Birth: 08-01-1951            Patient Gender: M Patient Age:   71 years Exam Location:  [facility] Procedure:      VAS [place] LOWER EXTREMITY VENOUS (DVT) Referring Phys: Risa Grill --------------------------------------------------------------------------------  Indications: Edema.  Comparison Study: No prior study Performing Technologist: Maudry Mayhew MHA, RDMS, RVT, RDCS  Examination Guidelines: A complete evaluation includes B-mode imaging, spectral Doppler, color Doppler, and power Doppler as needed of all accessible portions of each vessel. Bilateral testing is considered an integral part of a complete examination. Limited examinations for reoccurring indications may be performed as noted. The reflux portion of the exam is performed with the patient in reverse Trendelenburg.  +-----+---------------+---------+-----------+----------+--------------+ RIGHTCompressibilityPhasicitySpontaneityPropertiesThrombus Aging +-----+---------------+---------+-----------+----------+--------------+ CFV  Full           Yes      Yes                                 +-----+---------------+---------+-----------+----------+--------------+   +---------+---------------+---------+-----------+----------+--------------+ LEFT      CompressibilityPhasicitySpontaneityPropertiesThrombus Aging +---------+---------------+---------+-----------+----------+--------------+ CFV      Full           Yes      Yes                                 +---------+---------------+---------+-----------+----------+--------------+ SFJ      Full                                                        +---------+---------------+---------+-----------+----------+--------------+ FV Prox  Full                                                        +---------+---------------+---------+-----------+----------+--------------+ FV Mid   Full                                                        +---------+---------------+---------+-----------+----------+--------------+  FV DistalFull                                                        +---------+---------------+---------+-----------+----------+--------------+ PFV      Full                                                        +---------+---------------+---------+-----------+----------+--------------+ POP      Full           Yes      Yes                                 +---------+---------------+---------+-----------+----------+--------------+ PTV      None                    No                   Acute          +---------+---------------+---------+-----------+----------+--------------+ PERO     Full                                                        +---------+---------------+---------+-----------+----------+--------------+    Summary: RIGHT: - No evidence of common femoral vein obstruction.  LEFT: - Findings consistent with acute deep vein thrombosis involving the left posterior tibial veins. - No cystic structure found in the popliteal fossa.  *See table(s) above for measurements and observations. Electronically signed by Jamelle Haring on 03/08/2022 at 9:29:16 PM.    Final    Recent Labs    03/08/22 0636  WBC 11.9*  HGB 12.2*  HCT 35.8*  PLT 282    Recent Labs    03/08/22 0636 03/09/22 0539  NA 136 135  K 4.6 4.3  CL 102 103  CO2 27 25  GLUCOSE 150* 155*  BUN 28* 23  CREATININE 0.81 0.76  CALCIUM 8.8* 8.7*    Intake/Output Summary (Last 24 hours) at 03/09/2022 1009 Last data filed at 03/09/2022 0900 Gross per 24 hour  Intake 480 ml  Output 625 ml  Net -145 ml        Physical Exam: Vital Signs Blood pressure 113/71, pulse 67, temperature 97.6 F (36.4 C), temperature source Oral, resp. rate 16, height 6' (1.829 m), weight 92.4 kg, SpO2 97 %.  Constitutional: No distress . Vital signs reviewed. HEENT: NCAT, EOMI, oral membranes moist Neck: supple Cardiovascular: RRR without murmur. No JVD    Respiratory/Chest: CTA Bilaterally without wheezes or rales. Normal effort    GI/Abdomen: BS +, non-tender, non-distended Ext: no clubbing, cyanosis, or edema Psych: pleasant and cooperative  Skin: Clean and intact without signs of breakdown Neuro:  Alert and oriented x 3. Normal insight and awareness. Intact Memory. Normal language and speech. Cranial nerve exam unremarkable. LUE 0-tr shoulder, bicep, tricep, 1-2/4 wrist and 2-2+/4 finger flexors. RUE 4-5/5. LLE with 1/5 HE, KE and trace at foot. RLE 4/5. Extensor tone  present LLE and mild flexor tone in LUE. Seems to sense pain and LT in all four. DTR's 3+ LLE, 2+ LUE. --no motor/tone changes Musculoskeletal: left heel cord tight. No joint pain with ROM.    Assessment/Plan: 1. Functional deficits which require 3+ hours per day of interdisciplinary therapy in a comprehensive inpatient rehab setting. Physiatrist is providing close team supervision and 24 hour management of active medical problems listed below. Physiatrist and rehab team continue to assess barriers to discharge/monitor patient progress toward functional and medical goals  Care Tool:  Bathing    Body parts bathed by patient: Left arm, Chest, Abdomen, Front perineal area   Body parts bathed by helper: Right  arm, Buttocks, Right upper leg, Left upper leg, Right lower leg, Left lower leg     Bathing assist Assist Level: Maximal Assistance - Patient 24 - 49%     Upper Body Dressing/Undressing Upper body dressing   What is the patient wearing?: Pull over shirt    Upper body assist Assist Level: Maximal Assistance - Patient 25 - 49%    Lower Body Dressing/Undressing Lower body dressing    Lower body dressing activity did not occur: N/A What is the patient wearing?: Pants     Lower body assist Assist for lower body dressing: Total Assistance - Patient < 25%     Toileting Toileting    Toileting assist Assist for toileting: Maximal Assistance - Patient 25 - 49% Assistive Device Comment: urinal   Transfers Chair/bed transfer  Transfers assist  Chair/bed transfer activity did not occur: N/A  Chair/bed transfer assist level: Moderate Assistance - Patient 50 - 74%     Locomotion Ambulation   Ambulation assist   Ambulation activity did not occur: Refused  Assist level: Maximal Assistance - Patient 25 - 49% Assistive device: Other (comment) (R rail) Max distance: 2 ft   Walk 10 feet activity   Assist  Walk 10 feet activity did not occur: Safety/medical concerns        Walk 50 feet activity   Assist Walk 50 feet with 2 turns activity did not occur: Safety/medical concerns         Walk 150 feet activity   Assist Walk 150 feet activity did not occur: Safety/medical concerns         Walk 10 feet on uneven surface  activity   Assist Walk 10 feet on uneven surfaces activity did not occur: Safety/medical concerns         Wheelchair     Assist Is the patient using a wheelchair?: Yes Type of Wheelchair: Manual Wheelchair activity did not occur: Refused  Wheelchair assist level: Dependent - Patient 0%      Wheelchair 50 feet with 2 turns activity    Assist        Assist Level: Dependent - Patient 0%   Wheelchair 150 feet activity      Assist      Assist Level: Dependent - Patient 0%   Blood pressure 113/71, pulse 67, temperature 97.6 F (36.4 C), temperature source Oral, resp. rate 16, height 6' (1.829 m), weight 92.4 kg, SpO2 97 %.  Medical Problem List and Plan: 1. 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             -patient may shower but keep incision covered             -ELOS/Goals:  ELOS 14 to 20 days           -  Continue CIR therapies including PT, OT, and SLP  2.  Antithrombotics: -DVT/anticoagulation:  pt with left posterior tib dvt.   -check with NS about high prophylactic dose lovenox  -re-doppler him next week  -activity as tolerated             -antiplatelet therapy: none 3. Pain Management: Tylenol, hydrocodone, robaxin as needed             -Continue Gabapentin '100mg'$  BID 4. Mood: team providing emotional support             -antipsychotic agents: n/a             --Depression continue Lexapro 20 mg daily 5. Neuropsych: This patient is capable of making decisions on his own behalf. 6. Skin/Wound Care: Routine skin care checks             -- Monitor surgical incision 7. Fluids/Electrolytes/Nutrition: encourage PO  -BUN elevated today, recheck in morning  -protein supp for low albumin 9: History of multiple meningiomas/s/p resection:             -- Taper steroid--changed to PO form 5/17   -leukocytosis secondary to steroid             -- Continue Keppra and Lamictal             -- Calcium carbonate tablets 3 times daily 10: Dyslipidemia: Continue Crestor 20 mg daily 11: History of GERD: Continue Protonix 40 mg daily 12: DM-2: diet controlled (A1c = 5.7 on 02/22/2022)            -glucose elevation likely steroid effect 13: Anemia/acute intra-op blood loss: follow-up CBC 14. Constipation sorbitol, sse today  =start mom today per home regimen 15. Spasticity: will order night splint for LLE  -continue trial of baclofen '5mg'$   tid for tone  -splinting, rom with therapies     LOS: 2 days A FACE TO FACE EVALUATION WAS PERFORMED  Meredith Staggers 03/09/2022, 10:09 AM

## 2022-03-09 NOTE — Progress Notes (Signed)
Speech Language Pathology Daily Session Note  Patient Details  Name: Patrick Buchanan MRN: [id number] Date of Birth: [birth year]  Today's Date: 03/09/2022 SLP Individual Time: 0300-9233 SLP Individual Time Calculation (min): 55 min  Short Term Goals: Week 1: SLP Short Term Goal 1 (Week 1): Pt will complete complex problem solving tasks with min A verbal cues. SLP Short Term Goal 2 (Week 1): Pt will self-monitor and self-correct functional errors with min A verbal cues. SLP Short Term Goal 3 (Week 1): Pt will demonstrate selective attention in 30 minute intervals during complex tasks with supervision A verbal cues.  Skilled Therapeutic Interventions: Skilled treatment session focused on cognitive goals. SLP facilitated session by providing overall Min verbal cues for error awareness during a complex problem solving task in which patient had to identify errors in both BID and TID pill boxes. Function was impacted by impulsivity with task. Patient also recalled the functions of his curent medications with overall Min verbal cues. Throughout session, patient mildly verbose and required Min verbal cues for sustained attention to tasks. 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 Patient left upright in bed with alarm on and all needs within reach. Continue with current plan of care.      Pain No/Denies Pain   Therapy/Group: Individual Therapy  Patrick Buchanan 03/09/2022, 2:55 PM

## 2022-03-09 NOTE — Progress Notes (Signed)
Physical Therapy Session Note  Patient Details  Name: Patrick Buchanan MRN: [id number] Date of Birth: [birth year]  Today's Date: 03/09/2022 PT Individual Time: 8341-9622 PT Individual Time Calculation (min): 77 min   Short Term Goals: Week 1:  PT Short Term Goal 1 (Week 1): Patient will perform bed mobility with mod A in a flat bed with or without use of bed rails. PT Short Term Goal 2 (Week 1): Patient will perform basic transfers with mod A consistently. PT Short Term Goal 3 (Week 1): Patient will ambulate >20 feet using LRAD with max A of 1-2 people. PT Short Term Goal 4 (Week 1): Patient will improve score on Berg Balance Scale by 7 points to meet [name].  Skilled Therapeutic Interventions/Progress Updates:     Patient in [name] handed off from [name], [name], in the main therapy gym upon PT arrival. Patient alert and agreeable to PT session. Patient denied pain during session.  Adjusted TIS w/c to improve sitting posture and tolerance, providing patient a short rest break from OT session. Educated patient on pressure relief and requested family bring tennis shoes for gait training, patient reported he would ask his daughter to bring some.   Therapeutic Activity: Bed Mobility: Patient performed sit to supine with max A for lower extremity and trunk control. Provided verbal cues for bringing knees to chest and use of R elbow for trunk control. Patient doffed paper scrub pants with total A, demonstrating adductor activation in hook-lying and self initiated L knee/hip extension to straighten his leg.  Transfers: Patient performed squat pivot TIS w/c>bed with min-mod A to the R using the bed rail. Provided verbal cues for head hips relationship and blocked L knee for safety.  Neuromuscular Re-ed: Patient performed the following L upper and lower extremity motor control activities with functional tasks: -sit to stand with min-mod A x6 with B upper extremities x1 (limited by increased L flexion  in compromised position) and R upper extremity x5 focused on L weight bearing and knee/hip extension with multimodal cues for motor activation -standing balance x4 focused on midline orientation, L weight bearing, L quad and gluteal activation, and trunk extension  -stepping forward/backward with L foot x3 with max-total A for foot placement, noted hip flexor and adductor activation with stepping -therapeutic gait training with L DF wrap using R hallway rail 14 feet and 7 feet with max A for trunk control, weight shifting, and max-total A for L lower extremity management, noted increased hip flexor activation with limb advancement and quad and gluteal activation in stance with multimodal cues and repetition  Patient in bed handed off to LPN at end of session with breaks locked and all needs within reach.   Therapy Documentation Precautions:  Precautions Precautions: Fall, Other (comment) Precaution Comments: L hemi Required Braces or Orthoses: Other Brace (Wrist splint and PRAFO) Other Brace: Wrist splint and PRAFO Restrictions Weight Bearing Restrictions: No    Therapy/Group: Individual Therapy  Natallia Stellmach L Maximo Spratling PT, DPT  03/09/2022, 12:37 PM

## 2022-03-09 NOTE — Progress Notes (Signed)
Inpatient Rehabilitation Care Coordinator Assessment and Plan Patient Details  Name: Patrick Buchanan MRN: [id number] Date of Birth: Sep 12, 1951  Today's Date: 03/09/2022  Hospital Problems: Principal Problem:   Meningioma [name]:  Past Medical History:  Diagnosis Date   Allergy    hay fever   Blood transfusion without reported diagnosis    Cerebral aneurysm 1992   "leaking"; treated by Dr Sherwood Gambler   Depression    Diabetes mellitus without complication ([place])    type 2   GERD (gastroesophageal reflux disease)    past hx    Past Surgical History:  Past Surgical History:  Procedure Laterality Date   APPENDECTOMY     CEREBRAL ANEURYSM REPAIR  1992   clipping by Dr Sherwood Gambler; cannot have an MRI   CRANIOTOMY Bilateral 02/27/2022   Procedure: Craniotomy - bilateral - Parietal;  Surgeon: Earnie Larsson, MD;  Location: [facility];  Service: Neurosurgery;  Laterality: Bilateral;   HERNIA REPAIR N/A 1990   inguinal   INGUINAL HERNIA REPAIR     as a child and then another at 22 yrs old   TONSILLECTOMY     TUMOR EXCISION  2005   benign cervical   Social History:  reports that he quit smoking about 18 years ago. His smoking use included cigarettes. He has a 10.00 pack-year smoking history. He has never used smokeless tobacco. He reports that he does not currently use alcohol. He reports that he does not currently use drugs.  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: Training and development officer - How often do you need to have someone help you when you read instructions, pamphlets, or other written material from your doctor or pharmacy?: Never Writes: Yes Employment Status: Retired Date Retired/Disabled/Unemployed: 2016 Public relations account executive Issues: Denies Guardian/Conservator: N/A   Abuse/Neglect Abuse/Neglect Assessment Can Be Completed: Yes Physical Abuse: Denies Verbal Abuse: Denies Sexual Abuse: Denies Exploitation of patient/patient's resources: Denies Self-Neglect: Denies  Patient response to: Social Isolation - How often do you feel lonely or isolated from those around you?: Never  Emotional Status Pt's affect, behavior and adjustment status: Pt adjusting as well as to be expected. Guarded and not willing to disclose many emotions. Recent Psychosocial Issues: Lost wife in January 2023. Would not disclose anything on reason Psychiatric History: ADmits he takes medication for depression prescribed by PCP Substance Abuse History: Denies  Patient / Family Perceptions, Expectations & Goals Pt/Family understanding of illness & functional limitations: Pt has general understanding of pt care needs Premorbid pt/family roles/activities: Independent Anticipated changes in roles/activities/participation: Assistance with ADLs/IADLs Pt/family expectations/goals: Pt goal is to improve his legs so he can be able t walk. HE would also like to work on his arm but not as much as his leg.  Community Resources    Discharge Planning Living Arrangements: Alone Support Systems: Children, Home care staff Type of Residence: Private residence Insurance Resources: Multimedia programmer (specify) ([place]) Financial Resources: [place], Other (Comment) (savings/pension) Financial Screen Referred: No Living Expenses: Own Money  Management: Patient Does the patient have any problems obtaining your  medications?: No Home Management: Managed all homecare needs Patient/Family Preliminary Plans: TBD Care Coordinator Barriers to Discharge: Decreased caregiver support, Lack of/limited family support Care Coordinator Anticipated Follow Up Needs: HH/OP Expected length of stay: 3-3.5 weeks  Clinical Impression SW met with pt in room to introduce self, explain role, and discuss discharge process. Pt is not a English as a second language teacher. HCPOA- dtrs: [name] and Katie. Primary contact during this stay is his dtr [name] . He reports she works first shift. DME: several RWs and canes. He is aware SW will follow-up with his daughter.   Rana Snare 03/09/2022, 3:42 PM

## 2022-03-09 NOTE — Plan of Care (Signed)
  Problem: Consults Goal: RH BRAIN INJURY PATIENT EDUCATION Description: Description: See Patient Education module for eduction specifics Outcome: Progressing Goal: Skin Care Protocol Initiated - if Braden Score 18 or less Description: If consults are not indicated, leave blank or document N/A Outcome: Progressing   Problem: RH BOWEL ELIMINATION Goal: RH STG MANAGE BOWEL WITH ASSISTANCE Description: STG Manage Bowel with [place]. Outcome: Progressing Goal: RH STG MANAGE BOWEL W/MEDICATION W/ASSISTANCE Description: STG Manage Bowel with Medication with [place]. Outcome: Progressing   Problem: RH BLADDER ELIMINATION Goal: RH STG MANAGE BLADDER WITH ASSISTANCE Description: STG Manage Bladder With Min Assistance Outcome: Progressing Goal: RH STG MANAGE BLADDER WITH MEDICATION WITH ASSISTANCE Description: STG Manage Bladder With Medication With [place]. Outcome: Progressing   Problem: RH SKIN INTEGRITY Goal: RH STG MAINTAIN SKIN INTEGRITY WITH ASSISTANCE Description: STG Maintain Skin Integrity With [place]. Outcome: Progressing Goal: RH STG ABLE TO PERFORM INCISION/WOUND CARE W/ASSISTANCE Description: STG Able To Perform Incision/Wound Care With [employer]. Outcome: Progressing   Problem: RH SAFETY Goal: RH STG ADHERE TO SAFETY PRECAUTIONS W/ASSISTANCE/DEVICE Description: STG Adhere to Safety Precautions With Cues and Reminders. Outcome: Progressing Goal: RH STG DECREASED RISK OF FALL WITH ASSISTANCE Description: STG Decreased Risk of Fall With [employer]. Outcome: Progressing   Problem: RH COGNITION-NURSING Goal: RH STG USES MEMORY AIDS/STRATEGIES W/ASSIST TO PROBLEM SOLVE Description: STG Uses Memory Aids/Strategies With Min Assistance to Problem Solve. Outcome: Progressing Goal: RH STG ANTICIPATES NEEDS/CALLS FOR ASSIST W/ASSIST/CUES Description: STG Anticipates Needs/Calls for Assist With Cues and Reminders. Outcome: Progressing    Problem: RH PAIN MANAGEMENT Goal: RH STG PAIN MANAGED AT OR BELOW PT'S PAIN GOAL Description: < 3 on a 0-10 pain scale. Outcome: Progressing   Problem: RH KNOWLEDGE DEFICIT BRAIN INJURY Goal: RH STG INCREASE KNOWLEDGE OF SELF CARE AFTER BRAIN INJURY Description: Patient will demonstrate knowledge of self-care management, medication/pain management, skin/wound care management with educational materials and handouts provided by staff independently at discharge. Outcome: Progressing

## 2022-03-09 NOTE — Care Management (Signed)
[facility] Individual Statement of Services  Patient Name:  Patrick Buchanan  Date:  03/09/2022  Welcome to the [facility].  Our goal is to provide you with an individualized program based on your diagnosis and situation, designed to meet your specific needs.  With this comprehensive rehabilitation program, you will be expected to participate in at least 3 hours of rehabilitation therapies Monday-Friday, with modified therapy programming on the weekends.  Your rehabilitation program will include the following services:  Physical Therapy (PT), Occupational Therapy (OT), Speech Therapy (ST), 24 hour per day rehabilitation nursing, Therapeutic Recreaction (TR), Psychology, Neuropsychology, Care Coordinator, Rehabilitation Medicine, [place], and Other  Weekly team conferences will be held on Tuesdays to discuss your progress.  Your Inpatient Rehabilitation Care Coordinator will talk with you frequently to get your input and to update you on team discussions.  Team conferences with you and your family in attendance may also be held.  Expected length of stay: 3-3.5 weeks    Overall anticipated outcome: Minimal Assistance  Depending on your progress and recovery, your program may change. Your Inpatient Rehabilitation Care Coordinator will coordinate services and will keep you informed of any changes. Your Inpatient Rehabilitation Care Coordinator's name and contact numbers are listed  below.  The following services may also be recommended but are not provided by the [facility] will be made to provide these services after discharge if needed.  Arrangements include referral to agencies that provide these services.  Your insurance has been verified to be:  [employer]  Your primary doctor is:  Lorrene Reid  Pertinent information will be shared with your doctor and your insurance company.  Inpatient Rehabilitation Care Coordinator:  Cathleen Corti [phone] or [phone]  Information discussed with and copy given to patient by: Rana Snare, 03/09/2022, 11:05 AM

## 2022-03-09 NOTE — Progress Notes (Addendum)
Contacted Dr. Marchelle Folks medical assistant Wells Guiles this morning regarding left posterior tibial vein thrombus.  We will plan to start Lovenox 40 mg twice daily if Okd by Dr. Trenton Gammon.  The patient was to have a follow-up appointment today for staple removal.  We will place order.  Patient may shampoo hair avoiding soap directly onto incision and avoid scrubbing this area. Pat dry.  Confirmed with neurosurgery OK to start Lovenox 40 mg BID.

## 2022-03-10 LAB — TYPE AND SCREEN
ABO/RH(D): B POS
Antibody Screen: NEGATIVE
Unit division: 0
Unit division: 0
Unit division: 0
Unit division: 0

## 2022-03-10 LAB — BPAM RBC
Blood Product Expiration Date: 202305192359
Blood Product Expiration Date: 202305222359
Blood Product Expiration Date: 202305222359
Blood Product Expiration Date: 202305222359
ISSUE DATE / TIME: 202305141426
ISSUE DATE / TIME: 202305150911
ISSUE DATE / TIME: 202305190601
ISSUE DATE / TIME: 202305190601
Unit Type and Rh: 7300
Unit Type and Rh: 7300
Unit Type and Rh: 7300
Unit Type and Rh: 7300

## 2022-03-10 MED ORDER — POLYETHYLENE GLYCOL 3350 17 G PO PACK
17.0000 g | PACK | Freq: Two times a day (BID) | ORAL | Status: DC
  Administered 2022-03-12 – 2022-03-23 (×17): 17 g via ORAL
  Filled 2022-03-10 (×24): qty 1

## 2022-03-10 MED ORDER — SORBITOL 70 % SOLN
960.0000 mL | TOPICAL_OIL | Freq: Once | ORAL | Status: AC
  Administered 2022-03-10: 960 mL via RECTAL
  Filled 2022-03-10: qty 473

## 2022-03-10 MED ORDER — DEXAMETHASONE 2 MG PO TABS
2.0000 mg | ORAL_TABLET | Freq: Three times a day (TID) | ORAL | Status: DC
  Administered 2022-03-10 – 2022-03-14 (×12): 2 mg via ORAL
  Filled 2022-03-10 (×12): qty 1

## 2022-03-10 MED ORDER — ENSURE MAX PROTEIN PO LIQD
11.0000 [oz_av] | Freq: Every day | ORAL | Status: DC
Start: 2022-03-10 — End: 2022-03-30
  Administered 2022-03-10 – 2022-03-30 (×11): 11 [oz_av] via ORAL

## 2022-03-10 NOTE — Progress Notes (Signed)
Patient ID: Patrick Buchanan, male   DOB: Nov 15, 1950, 71 y.o.   MRN: [id number]  SW made contact with pt dtr Ashe[place] to introduce self, explain role, discuss discharge process, and inform on ELOS. SW informed there will be more updates after team conference.   Loralee Pacas, MSW, [place] Office: [phone] Cell: [phone] Fax: [phone]

## 2022-03-10 NOTE — Plan of Care (Signed)
  Problem: Consults Goal: RH BRAIN INJURY PATIENT EDUCATION Description: Description: See Patient Education module for eduction specifics Outcome: Progressing Goal: Skin Care Protocol Initiated - if Braden Score 18 or less Description: If consults are not indicated, leave blank or document N/A Outcome: Progressing   Problem: RH BOWEL ELIMINATION Goal: RH STG MANAGE BOWEL WITH ASSISTANCE Description: STG Manage Bowel with [place]. Outcome: Progressing Goal: RH STG MANAGE BOWEL W/MEDICATION W/ASSISTANCE Description: STG Manage Bowel with Medication with [place]. Outcome: Progressing   Problem: RH BLADDER ELIMINATION Goal: RH STG MANAGE BLADDER WITH ASSISTANCE Description: STG Manage Bladder With Min Assistance Outcome: Progressing Goal: RH STG MANAGE BLADDER WITH MEDICATION WITH ASSISTANCE Description: STG Manage Bladder With Medication With [place]. Outcome: Progressing   Problem: RH SKIN INTEGRITY Goal: RH STG MAINTAIN SKIN INTEGRITY WITH ASSISTANCE Description: STG Maintain Skin Integrity With [place]. Outcome: Progressing Goal: RH STG ABLE TO PERFORM INCISION/WOUND CARE W/ASSISTANCE Description: STG Able To Perform Incision/Wound Care With [employer]. Outcome: Progressing   Problem: RH SAFETY Goal: RH STG ADHERE TO SAFETY PRECAUTIONS W/ASSISTANCE/DEVICE Description: STG Adhere to Safety Precautions With Cues and Reminders. Outcome: Progressing Goal: RH STG DECREASED RISK OF FALL WITH ASSISTANCE Description: STG Decreased Risk of Fall With [employer]. Outcome: Progressing   Problem: RH COGNITION-NURSING Goal: RH STG USES MEMORY AIDS/STRATEGIES W/ASSIST TO PROBLEM SOLVE Description: STG Uses Memory Aids/Strategies With Min Assistance to Problem Solve. Outcome: Progressing Goal: RH STG ANTICIPATES NEEDS/CALLS FOR ASSIST W/ASSIST/CUES Description: STG Anticipates Needs/Calls for Assist With Cues and Reminders. Outcome: Progressing    Problem: RH PAIN MANAGEMENT Goal: RH STG PAIN MANAGED AT OR BELOW PT'S PAIN GOAL Description: < 3 on a 0-10 pain scale. Outcome: Progressing   Problem: RH KNOWLEDGE DEFICIT BRAIN INJURY Goal: RH STG INCREASE KNOWLEDGE OF SELF CARE AFTER BRAIN INJURY Description: Patient will demonstrate knowledge of self-care management, medication/pain management, skin/wound care management with educational materials and handouts provided by staff independently at discharge. Outcome: Progressing

## 2022-03-10 NOTE — Progress Notes (Signed)
Occupational Therapy Note  Patient Details  Name: Patrick Buchanan MRN: [id number] Date of Birth: 09-12-1951  Today's Date: 03/10/2022 OT Missed Time: 89 Minutes Missed Time Reason: Nursing care  Attempted to make up time from this morning's session. Pt adamantly refusing to attempt any mobility despite OT providing education on standing and impact of gravity on BM success. He was left supine with all needs met, bed alarm set.    Curtis Sites 03/10/2022, 1:19 PM

## 2022-03-10 NOTE — Progress Notes (Signed)
PROGRESS NOTE   Subjective/Complaints: Experiencing a lot of GI discomfort due to constipation.  Did not have a bm since 5/17. Couldn't sleep d/t above.  ROS: Patient denies fever, rash, sore throat, blurred vision, dizziness, nausea, vomiting, diarrhea, cough, shortness of breath or chest pain, joint or back/neck pain, headache, or mood change.    Objective:   VAS [place] LOWER EXTREMITY VENOUS (DVT)  Result Date: 03/08/2022  Lower Venous DVT Study Patient Name:  Patrick Buchanan  Date of Exam:   03/08/2022 Medical Rec #: [id number]            Accession #:    [id number] Date of Birth: 03/23/1951            Patient Gender: M Patient Age:   71 years Exam Location:  [facility] Procedure:      VAS [place] LOWER EXTREMITY VENOUS (DVT) Referring Phys: Risa Grill --------------------------------------------------------------------------------  Indications: Edema.  Comparison Study: No prior study Performing Technologist: Maudry Mayhew MHA, RDMS, RVT, RDCS  Examination Guidelines: A complete evaluation includes B-mode imaging, spectral Doppler, color Doppler, and power Doppler as needed of all accessible portions of each vessel. Bilateral testing is considered an integral part of a complete examination. Limited examinations for reoccurring indications may be performed as noted. The reflux portion of the exam is performed with the patient in reverse Trendelenburg.  +-----+---------------+---------+-----------+----------+--------------+ RIGHTCompressibilityPhasicitySpontaneityPropertiesThrombus Aging +-----+---------------+---------+-----------+----------+--------------+ CFV  Full           Yes      Yes                                 +-----+---------------+---------+-----------+----------+--------------+   +---------+---------------+---------+-----------+----------+--------------+ LEFT      CompressibilityPhasicitySpontaneityPropertiesThrombus Aging +---------+---------------+---------+-----------+----------+--------------+ CFV      Full           Yes      Yes                                 +---------+---------------+---------+-----------+----------+--------------+ SFJ      Full                                                        +---------+---------------+---------+-----------+----------+--------------+ FV Prox  Full                                                        +---------+---------------+---------+-----------+----------+--------------+ FV Mid   Full                                                        +---------+---------------+---------+-----------+----------+--------------+  FV DistalFull                                                        +---------+---------------+---------+-----------+----------+--------------+ PFV      Full                                                        +---------+---------------+---------+-----------+----------+--------------+ POP      Full           Yes      Yes                                 +---------+---------------+---------+-----------+----------+--------------+ PTV      None                    No                   Acute          +---------+---------------+---------+-----------+----------+--------------+ PERO     Full                                                        +---------+---------------+---------+-----------+----------+--------------+    Summary: RIGHT: - No evidence of common femoral vein obstruction.  LEFT: - Findings consistent with acute deep vein thrombosis involving the left posterior tibial veins. - No cystic structure found in the popliteal fossa.  *See table(s) above for measurements and observations. Electronically signed by Jamelle Haring on 03/08/2022 at 9:29:16 PM.    Final    Recent Labs    03/08/22 0636  WBC 11.9*  HGB 12.2*  HCT 35.8*  PLT 282    Recent Labs    03/08/22 0636 03/09/22 0539  NA 136 135  K 4.6 4.3  CL 102 103  CO2 27 25  GLUCOSE 150* 155*  BUN 28* 23  CREATININE 0.81 0.76  CALCIUM 8.8* 8.7*    Intake/Output Summary (Last 24 hours) at 03/10/2022 1113 Last data filed at 03/10/2022 0756 Gross per 24 hour  Intake 760 ml  Output 700 ml  Net 60 ml        Physical Exam: Vital Signs Blood pressure 121/76, pulse 66, temperature (!) 97.5 F (36.4 C), temperature source Oral, resp. rate 18, height 6' (1.829 m), weight 92.4 kg, SpO2 97 %.  Constitutional: No distress . Vital signs reviewed. HEENT: NCAT, EOMI, oral membranes moist Neck: supple Cardiovascular: RRR without murmur. No JVD    Respiratory/Chest: CTA Bilaterally without wheezes or rales. Normal effort    GI/Abdomen: BS scarce, non distended. Sl tender Ext: no clubbing, cyanosis, or edema Psych: pleasant and cooperative  Skin: Clean and intact without signs of breakdown, scalp incision cdi Neuro:  Alert and oriented x 3. Normal insight and awareness. Intact Memory. Normal language and speech. Cranial nerve exam unremarkable. LUE 0-tr shoulder, bicep, tricep, 1-2/4 wrist and 2-2+/4 finger flexors. RUE 4-5/5. LLE with 1/5 HE, KE and trace  at foot. RLE 4/5. Improved extensor tone LLE and  flexor tone in LUE. Seems to sense pain and LT in all four. DTR's 3+ LLE, 2+ LUE. -stable exam Musculoskeletal: left heel cord still tight. No joint pain with ROM.    Assessment/Plan: 1. Functional deficits which require 3+ hours per day of interdisciplinary therapy in a comprehensive inpatient rehab setting. Physiatrist is providing close team supervision and 24 hour management of active medical problems listed below. Physiatrist and rehab team continue to assess barriers to discharge/monitor patient progress toward functional and medical goals  Care Tool:  Bathing    Body parts bathed by patient: Left arm, Chest, Abdomen, Front perineal area   Body parts bathed  by helper: Right arm, Buttocks, Right upper leg, Left upper leg, Right lower leg, Left lower leg     Bathing assist Assist Level: Maximal Assistance - Patient 24 - 49%     Upper Body Dressing/Undressing Upper body dressing   What is the patient wearing?: Pull over shirt    Upper body assist Assist Level: Maximal Assistance - Patient 25 - 49%    Lower Body Dressing/Undressing Lower body dressing      What is the patient wearing?: Pants     Lower body assist Assist for lower body dressing: Total Assistance - Patient < 25%     Toileting Toileting    Toileting assist Assist for toileting: Maximal Assistance - Patient 25 - 49% Assistive Device Comment: urinal   Transfers Chair/bed transfer  Transfers assist     Chair/bed transfer assist level: Moderate Assistance - Patient 50 - 74%     Locomotion Ambulation   Ambulation assist      Assist level: Maximal Assistance - Patient 25 - 49% Assistive device: Other (comment) (R rail) Max distance: 2 ft   Walk 10 feet activity   Assist  Walk 10 feet activity did not occur: Safety/medical concerns        Walk 50 feet activity   Assist Walk 50 feet with 2 turns activity did not occur: Safety/medical concerns         Walk 150 feet activity   Assist Walk 150 feet activity did not occur: Safety/medical concerns         Walk 10 feet on uneven surface  activity   Assist Walk 10 feet on uneven surfaces activity did not occur: Safety/medical concerns         Wheelchair     Assist Is the patient using a wheelchair?: Yes Type of Wheelchair: Manual    Wheelchair assist level: Dependent - Patient 0%      Wheelchair 50 feet with 2 turns activity    Assist        Assist Level: Dependent - Patient 0%   Wheelchair 150 feet activity     Assist      Assist Level: Dependent - Patient 0%   Blood pressure 121/76, pulse 66, temperature (!) 97.5 F (36.4 C), temperature source Oral,  resp. rate 18, height 6' (1.829 m), weight 92.4 kg, SpO2 97 %.  Medical Problem List and Plan: 1. 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             -patient may shower but keep incision covered             -ELOS/Goals:  ELOS 14 to 20 days          -Continue CIR therapies including PT, OT, and SLP  2.  Antithrombotics: -DVT/anticoagulation:  pt with left posterior tib dvt.   -began lovenox '40mg'$  q12  -re-doppler next week  -activity as tolerated             -antiplatelet therapy: none 3. Pain Management: Tylenol, hydrocodone, robaxin as needed             -Continue Gabapentin '100mg'$  BID 4. Mood: team providing emotional support             -antipsychotic agents: n/a             --Depression continue Lexapro 20 mg daily 5. Neuropsych: This patient is capable of making decisions on his own behalf. 6. Skin/Wound Care: Routine skin care checks             -- Monitor surgical incision 7. Fluids/Electrolytes/Nutrition:    -BUN improved yesterday, continue to push fluids  -protein supp for low albumin 9: History of multiple meningiomas/s/p resection:             -- Taper steroid--changed to PO form 5/17--decrease to q8 5/19   -leukocytosis secondary to steroid             -- Continue Keppra and Lamictal             -- Calcium carbonate tablets 3 times daily 10: Dyslipidemia: Continue Crestor 20 mg daily 11: History of GERD: Continue Protonix 40 mg daily 12: DM-2: diet controlled (A1c = 5.7 on 02/22/2022)            -glucose elevation likely steroid effect 13: Anemia/acute intra-op blood loss: follow-up CBC 14. Constipation: some results 5/17, none since-  -SMOG enema today  -if no results, will check KUB  -change from MOM to miralax bid 15. Spasticity:   night splint for LLE ordered  -continue trial of baclofen '5mg'$  tid for tone-- I see improvement  -splinting, rom with therapies     LOS: 3 days A FACE TO FACE  EVALUATION WAS PERFORMED  Meredith Staggers 03/10/2022, 11:13 AM

## 2022-03-10 NOTE — Discharge Instructions (Addendum)
Inpatient Rehab Discharge Instructions  KARY COLAIZZI Discharge date and time:  03/30/2022  Activities/Precautions/ Functional Status: Activity: no lifting, driving, or strenuous exercise until cleared by MD Diet: diabetic diet Wound Care: keep wound clean and dry Functional status:  ___ No restrictions     ___ Walk up steps independently ___ 24/7 supervision/assistance   ___ Walk up steps with assistance _x__ Intermittent supervision/assistance  ___ Bathe/dress independently ___ Walk with walker     __x_ Bathe/dress with assistance ___ Walk Independently    ___ Shower independently ___ Walk with assistance    __x_ Shower with assistance __x_ No alcohol     ___ Return to work/school ________  COMMUNITY REFERRALS UPON DISCHARGE:    Home Health:   PT      OT       SNA                      Agency: [facility]     Phone:  [phone]    Medical Equipment/Items Ordered: hospital bed, tub transfer bench, hoyer lift, drop arm bedside commode                                                 Agency/Supplier: [facility] [phone]   Special Instructions:  No driving, alcohol consumption or tobacco use.   My questions have been answered and I understand these instructions. I will adhere to these goals and the provided educational materials after my discharge from the hospital.  Patient/Caregiver Signature _______________________________ Date __________  Clinician Signature _______________________________________ Date __________  Please bring this form and your medication list with you to all your follow-up doctor's appointments.

## 2022-03-10 NOTE — Progress Notes (Signed)
SLP Cancellation Note  Patient Details Name: Patrick Buchanan MRN: [id number] DOB: 17-Apr-1951   Cancelled treatment:       Patient missed 45 minutes of skilled SLP intervention due to fatigue. Patient declined participation despite encouragement due to feeling "worn out." Attempted to schedule another time to come back later in the afternoon and patient declined. Continue with current plan of care.                                                                                                 [place], [place] 03/10/2022, 3:17 PM

## 2022-03-10 NOTE — Progress Notes (Signed)
Occupational Therapy Session Note  Patient Details  Name: IZAAH WESTMAN MRN: [id number] Date of Birth: Oct 03, 1951  Today's Date: 03/10/2022 OT Individual Time: 1005-1020 OT Individual Time Calculation (min): 15 min  60 min missed d/t    Short Term Goals: Week 1:  OT Short Term Goal 1 (Week 1): Pt will sit dynamically at EOB or EOM for 10 min with no more than CGA for balance corrections OT Short Term Goal 2 (Week 1): Pt will complete toilet transfer with MAX A of 1 and LRAD OT Short Term Goal 3 (Week 1): Pt will sit to stand with MOD A Overall in prep for LB and toileting tasks OT Short Term Goal 4 (Week 1): Pt will recall hemi strategies with MIN cuing  Skilled Therapeutic Interventions/Progress Updates:    Pt received supine, voicing frustrations re constipation. Provided education re abdominal massage but pt declined stating an enema was the only solution. Coordinated care with LPN Caylee re toileting schedule, getting up to the [facility], and enema. Pt voiced appreciation for OT listening and was agreeable to try getting OOB later in the day. OT will f/u as available. 60 min missed.   Therapy Documentation Precautions:  Precautions Precautions: Fall, Other (comment) Precaution Comments: L hemi Required Braces or Orthoses: Other Brace (Wrist splint and PRAFO) Other Brace: Wrist splint and PRAFO Restrictions Weight Bearing Restrictions: No  Therapy/Group: Individual Therapy  Curtis Sites 03/10/2022, 6:29 AM

## 2022-03-10 NOTE — IPOC Note (Signed)
Overall Plan of Care [facility]) Patient Details Name: Patrick Buchanan MRN: [id number] DOB: January 15, 1951  Admitting Diagnosis: Meningioma [facility]), spastic left hemiparesis  Hospital Problems: Principal Problem:   Meningioma [facility])     Functional Problem List: Nursing Bladder, Bowel, Edema, Endurance, Medication Management, Motor, Pain, Safety, Skin Integrity  PT Balance, Perception, Safety, Behavior, Edema, Sensory, Skin Integrity, Endurance, Motor, Nutrition, Pain  OT Balance, Cognition, Endurance, Motor, Nutrition, Pain, Safety, Sensory, Vision  SLP Cognition  TR         Basic ADL's: OT Eating, Grooming, Bathing, Dressing, Toileting     Advanced  ADL's: OT       Transfers: PT Bed Mobility, Bed to Chair, Car, Manufacturing systems engineer, Metallurgist: PT Ambulation, Emergency planning/management officer, Stairs     Additional Impairments: OT Fuctional Use of Upper Extremity  SLP Social Cognition   Problem Solving, Awareness, Attention  TR      Anticipated Outcomes Item Anticipated Outcome  Self Feeding S  Swallowing      Basic self-care  MIN  Toileting  MIN   Bathroom Transfers MIN  Bowel/Bladder  min assist  Transfers  CGA using LRAD  Locomotion  min A >100 using LRAD  Communication     Cognition  Mod I  Pain  < 3  Safety/Judgment  min assist   Therapy Plan: PT Intensity: Minimum of 1-2 x/day ,45 to 90 minutes PT Frequency: 5 out of 7 days PT Duration Estimated Length of Stay: 3-3.5 weeks OT Intensity: Minimum of 1-2 x/day, 45 to 90 minutes OT Frequency: 5 out of 7 days OT Duration/Estimated Length of Stay: 3-3.5 weeks SLP Intensity: Minumum of 1-2 x/day, 30 to 90 minutes SLP Frequency: 3 to 5 out of 7 days SLP Duration/Estimated Length of Stay: 3-3.5 weeks   Team Interventions: Nursing Interventions Patient/Family Education, Bladder Management, Bowel Management, Disease Management/Prevention, Pain Management, Medication Management, Skin Care/Wound  Management, Discharge Planning  PT interventions Ambulation/gait training, Cognitive remediation/compensation, Discharge planning, DME/adaptive equipment instruction, Functional mobility training, Pain management, Psychosocial support, Splinting/orthotics, Therapeutic Activities, UE/LE Strength taining/ROM, Visual/perceptual remediation/compensation, Wheelchair propulsion/positioning, Therapeutic Exercise, UE/LE Coordination activities, Stair training, Skin care/wound management, Patient/family education, Neuromuscular re-education, Functional electrical stimulation, Disease management/prevention, Academic librarian, Training and development officer  OT Interventions Training and development officer, Engineer, drilling, Patient/family education, Therapeutic Activities, Wheelchair propulsion/positioning, Therapeutic Exercise, Psychosocial support, Cognitive remediation/compensation, Community reintegration, Functional mobility training, UE/LE Strength taining/ROM, Self Care/advanced ADL retraining, UE/LE Coordination activities, Skin care/wound managment, Neuromuscular re-education, Discharge planning, Disease mangement/prevention, Splinting/orthotics, Visual/perceptual remediation/compensation, Pain management  SLP Interventions Cognitive remediation/compensation, Cueing hierarchy, Functional tasks, Medication managment, Patient/family education  TR Interventions    SW/CM Interventions Discharge Planning, Psychosocial Support, Patient/Family Education   Barriers to Discharge MD  Medical stability  Nursing Decreased caregiver support, Home environment access/layout, Incontinence, Wound Care, Lack of/limited family support, Weight 2 level, 3 steps, left rail. Bed/bath main level. Daughters and private caregivers to provide 24/7 care.  PT Inaccessible home environment, Decreased caregiver support, Lack of/limited family support, Behavior    OT Decreased caregiver support, Home environment  access/layout, Lack of/limited family support, Incontinence    SLP      SW Decreased caregiver support, Lack of/limited family support     Team Discharge Planning: Destination: PT-Home ,OT- Home , SLP-Home Projected Follow-up: PT-Outpatient PT, OT-  Home health OT, SLP-None Projected Equipment Needs: PT-To be determined, OT- 3 in 1 bedside comode, Tub/shower bench, To be determined, SLP-None recommended by SLP Equipment Details: PT- , OT-  Patient/family involved in discharge planning: PT- Patient,  OT-Patient, SLP-Patient  MD ELOS: 21-24 days Medical Rehab Prognosis:  Excellent Assessment: The patient has been admitted for CIR therapies with the diagnosis of meningioma s/p resection with left hemiparesis. The team will be addressing functional mobility, strength, stamina, balance, safety, adaptive techniques and equipment, self-care, bowel and bladder mgt, patient and caregiver education, NMR, spasticity, pain control, cognition, community reentry. Goals have been set at min assist to CGA with mobility and self-care and mod I with cognition. Anticipated discharge destination is home.        See Team Conference Notes for weekly updates to the plan of care

## 2022-03-10 NOTE — Progress Notes (Signed)
Physical Therapy Session Note  Patient Details  Name: Patrick Buchanan MRN: [id number] Date of Birth: 08/31/1951  Today's Date: 03/10/2022 PT Individual Time: 1415-1505 PT Individual Time Calculation (min): 50 min   Short Term Goals: Week 1:  PT Short Term Goal 1 (Week 1): Patient will perform bed mobility with mod A in a flat bed with or without use of bed rails. PT Short Term Goal 2 (Week 1): Patient will perform basic transfers with mod A consistently. PT Short Term Goal 3 (Week 1): Patient will ambulate >20 feet using LRAD with max A of 1-2 people. PT Short Term Goal 4 (Week 1): Patient will improve score on Berg Balance Scale by 7 points to meet [name].  Skilled Therapeutic Interventions/Progress Updates: Pt presents supine in bed w/ nursing present, but refusing to participate w/ therapy 2/2 abd pain and just finished w/ BM after enema.  Pt able to be convinced to perform LE there ex.  Pt encouraged to participate w/ OOB activities to increase bowel motility but still refusing.  Pt tolerated AP and HS actively to RLE and long HC stretch to L and hip/knee flexion w/ pt encouraged to extend w/ noted participation by pt.  Pt c/o need to use urinal but unsuccessful, but then states need for bedpan.  Pt required mod A for rolling to left w/ cues for hooklying position RLE and max to roll to right.  Pt continent of bowel in bedpan, nursing notified.  Pt rolled to left and equired total A for pericare, some BM got on brief so performed rolling side to side for new brief.  Pt able to pull self to [facility] w/ R extremities.  Pt remained supine w/ head and knees elevated, L heel floating.  Bed alarm on and all needs in reach.       Therapy Documentation Precautions:  Precautions Precautions: Fall, Other (comment) Precaution Comments: L hemi Required Braces or Orthoses: Other Brace (Wrist splint and PRAFO) Other Brace: Wrist splint and PRAFO Restrictions Weight Bearing Restrictions: No General: PT  Amount of Missed Time (min): 25 Minutes PT Missed Treatment Reason: Patient unwilling to participate;Pain;Patient ill (Comment) (abd pain, constipation,) Vital Signs: Therapy Vitals Temp: 98.3 F (36.8 C) Pulse Rate: 86 Resp: 17 BP: (!) 130/93 Patient Position (if appropriate): Lying Oxygen Therapy SpO2: 95 % O2 Device: Room Air Pain:6/10        Therapy/Group: Individual Therapy  [name] 03/10/2022, 3:20 PM

## 2022-03-11 MED ORDER — MAGNESIUM GLUCONATE 500 MG PO TABS
250.0000 mg | ORAL_TABLET | Freq: Every day | ORAL | Status: DC
  Administered 2022-03-11 – 2022-03-29 (×19): 250 mg via ORAL
  Filled 2022-03-11 (×19): qty 1

## 2022-03-11 NOTE — Progress Notes (Signed)
PROGRESS NOTE   Subjective/Complaints: Complains of long waiting time when he needs to use bathroom- I messaged [facility] to let her know. He understands staff is busy, was appreciate of attentiveness of night staff  ROS: Patient denies fever, rash, sore throat, blurred vision, dizziness, nausea, vomiting, diarrhea, cough, shortness of breath or chest pain, joint or back/neck pain, headache, or mood change. Constipation has resolved   Objective:   No results found. No results for input(s): WBC, HGB, HCT, PLT in the last 72 hours.  Recent Labs    03/09/22 0539  NA 135  K 4.3  CL 103  CO2 25  GLUCOSE 155*  BUN 23  CREATININE 0.76  CALCIUM 8.7*    Intake/Output Summary (Last 24 hours) at 03/11/2022 1453 Last data filed at 03/11/2022 1317 Gross per 24 hour  Intake 120 ml  Output 550 ml  Net -430 ml        Physical Exam: Vital Signs Blood pressure (!) 118/91, pulse 91, temperature 99.1 F (37.3 C), temperature source Oral, resp. rate 16, height 6' (1.829 m), weight 92.4 kg, SpO2 95 %. Gen: no distress, normal appearing HEENT: oral mucosa pink and moist, NCAT Cardio: Reg rate Chest: normal effort, normal rate of breathing Abd: soft, non-distended  Ext: no clubbing, cyanosis, or edema Psych: pleasant and cooperative  Skin: Clean and intact without signs of breakdown, scalp incision cdi Neuro:  Alert and oriented x 3. Normal insight and awareness. Intact Memory. Normal language and speech. Cranial nerve exam unremarkable. LUE 0-tr shoulder, bicep, tricep, 1-2/4 wrist and 2-2+/4 finger flexors. RUE 4-5/5. LLE with 1/5 HE, KE and trace at foot. RLE 4/5. Improved extensor tone LLE and  flexor tone in LUE. Seems to sense pain and LT in all four. DTR's 3+ LLE, 2+ LUE. -stable exam Musculoskeletal: left heel cord still tight. No joint pain with ROM.    Assessment/Plan: 1. Functional deficits which require 3+ hours per day  of interdisciplinary therapy in a comprehensive inpatient rehab setting. Physiatrist is providing close team supervision and 24 hour management of active medical problems listed below. Physiatrist and rehab team continue to assess barriers to discharge/monitor patient progress toward functional and medical goals  Care Tool:  Bathing    Body parts bathed by patient: Left arm, Chest, Abdomen, Front perineal area   Body parts bathed by helper: Right arm, Buttocks, Right upper leg, Left upper leg, Right lower leg, Left lower leg     Bathing assist Assist Level: Maximal Assistance - Patient 24 - 49%     Upper Body Dressing/Undressing Upper body dressing   What is the patient wearing?: Pull over shirt    Upper body assist Assist Level: Maximal Assistance - Patient 25 - 49%    Lower Body Dressing/Undressing Lower body dressing      What is the patient wearing?: Pants     Lower body assist Assist for lower body dressing: Total Assistance - Patient < 25%     Toileting Toileting    Toileting assist Assist for toileting: Dependent - Patient 0% Assistive Device Comment: urinal   Transfers Chair/bed transfer  Transfers assist     Chair/bed transfer assist level:  Moderate Assistance - Patient 50 - 74%     Locomotion Ambulation   Ambulation assist      Assist level: Maximal Assistance - Patient 25 - 49% Assistive device: Other (comment) (R rail) Max distance: 2 ft   Walk 10 feet activity   Assist  Walk 10 feet activity did not occur: Safety/medical concerns        Walk 50 feet activity   Assist Walk 50 feet with 2 turns activity did not occur: Safety/medical concerns         Walk 150 feet activity   Assist Walk 150 feet activity did not occur: Safety/medical concerns         Walk 10 feet on uneven surface  activity   Assist Walk 10 feet on uneven surfaces activity did not occur: Safety/medical concerns         Wheelchair     Assist  Is the patient using a wheelchair?: Yes Type of Wheelchair: Manual    Wheelchair assist level: Dependent - Patient 0%      Wheelchair 50 feet with 2 turns activity    Assist        Assist Level: Dependent - Patient 0%   Wheelchair 150 feet activity     Assist      Assist Level: Dependent - Patient 0%   Blood pressure (!) 118/91, pulse 91, temperature 99.1 F (37.3 C), temperature source Oral, resp. rate 16, height 6' (1.829 m), weight 92.4 kg, SpO2 95 %.  Medical Problem List and Plan: 1. 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             -patient may shower but keep incision covered             -ELOS/Goals:  ELOS 14 to 20 days          -Continue CIR therapies including PT, OT, and SLP Discussed his staffing concerns with [facility]   2.  Antithrombotics: -DVT/anticoagulation:  pt with left posterior tib dvt.   -began lovenox '40mg'$  q12  -re-doppler next week  -activity as tolerated             -antiplatelet therapy: none 3. Pain Management: Tylenol, hydrocodone, robaxin as needed             -Continue Gabapentin '100mg'$  BID 4. Mood: team providing emotional support             -antipsychotic agents: n/a             --Depression continue Lexapro 20 mg daily 5. Neuropsych: This patient is capable of making decisions on his own behalf. 6. Skin/Wound Care: Routine skin care checks             -- Monitor surgical incision 7. Fluids/Electrolytes/Nutrition:    -BUN improved yesterday, continue to push fluids  -protein supp for low albumin 9: History of multiple meningiomas/s/p resection:             -- Taper steroid--changed to PO form 5/17--decrease to q8 5/19   -leukocytosis secondary to steroid             -- Continue Keppra and Lamictal             -- Calcium carbonate tablets 3 times daily 10: Dyslipidemia: Continue Crestor 20 mg daily 11: History of GERD: Continue Protonix 40 mg daily 12:  DM-2: diet controlled (A1c = 5.7 on 02/22/2022)            -  glucose elevation likely steroid effect 13: Anemia/acute intra-op blood loss: follow-up CBC 14. Constipation: some results 5/17, none since-  -SMOG enema today  -if no results, will check KUB  -change from MOM to miralax bid  -states bowels moving more easily with milk of magnesia, will add magnesium gluconate '250mg'$  HS 15. Spasticity:   night splint for LLE ordered  -continue trial of baclofen '5mg'$  tid for tone-- I see improvement  -splinting, rom with therapies  16. Hypertension: start magnesium gluconate '250mg'$  HS    LOS: 4 days A FACE TO FACE EVALUATION WAS PERFORMED  Patrick Buchanan Ayde Record 03/11/2022, 2:53 PM

## 2022-03-12 DIAGNOSIS — R252 Cramp and spasm: Secondary | ICD-10-CM

## 2022-03-12 DIAGNOSIS — I10 Essential (primary) hypertension: Secondary | ICD-10-CM

## 2022-03-12 NOTE — Progress Notes (Signed)
Physical Therapy Session Note  Patient Details  Name: Patrick Buchanan MRN: [id number] Date of Birth: 01-18-1951  Today's Date: 03/12/2022 PT Individual Time: 1300-1410 PT Individual Time Calculation (min): 70 min   Short Term Goals: Week 1:  PT Short Term Goal 1 (Week 1): Patient will perform bed mobility with mod A in a flat bed with or without use of bed rails. PT Short Term Goal 2 (Week 1): Patient will perform basic transfers with mod A consistently. PT Short Term Goal 3 (Week 1): Patient will ambulate >20 feet using LRAD with max A of 1-2 people. PT Short Term Goal 4 (Week 1): Patient will improve score on Berg Balance Scale by 7 points to meet [name].  Skilled Therapeutic Interventions/Progress Updates:     Patient in TIS w/c, tolerated >1.5 hours in the w/c today, in the room upon PT arrival. Patient alert and agreeable to PT session. Patient denied pain during session.  Therapeutic Activity: Bed Mobility: Patient performed sit to supine with max A for lower extremity and trunk control. Provided verbal cues for bringing knees to chest and use of R elbow for trunk control. Patient doffed paper scrub pants with total A, demonstrating adductor activation in hook-lying and self initiated L knee/hip extension to straighten his leg.  Transfers: Patient performed squat pivot TIS w/c>bed with min-mod A to the R using the bed rail. Provided verbal cues for head hips relationship and blocked L knee for safety.   Neuromuscular Re-ed: Patient performed the following L upper and lower extremity motor control activities with functional tasks: -sit to stand with min-mod A x6 using B upper extremities with L hand with ACE wrap to the hand grip focused on L weight bearing and knee/hip extension with multimodal cues for motor activation -standing balance x2 in // bars with mirror for visual feedback and x2 while donning Lite Gait harness focused on midline orientation, L weight bearing, L quad and  gluteal activation, and trunk extension  -therapeutic gait training in Lite Gait 40 feet and 50 feet with facilitation for trunk control, weight shifting, and max-total A for L lower extremity management, noted increased hip flexor activation with limb advancement and quad and gluteal activation in stance with multimodal cues and repetition, demonstrates flexed trunk with L lean and crouched posture with fatigue  Patient in bed with multiple pillows placed for L extremity elevation and trunk positioning and L PRAFO donned at end of session with breaks locked, bed alarm set, and all needs within reach.   Therapy Documentation Precautions:  Precautions Precautions: Fall, Other (comment) Precaution Comments: L hemi Required Braces or Orthoses: Other Brace (Wrist splint and PRAFO) Other Brace: Wrist splint and PRAFO Restrictions Weight Bearing Restrictions: No    Therapy/Group: Individual Therapy  Feliz Herard L Yoltzin Ransom PT, DPT  03/12/2022, 7:19 PM

## 2022-03-12 NOTE — Progress Notes (Addendum)
Occupational Therapy Session Note  Patient Details  Name: Patrick Buchanan MRN: [id number] Date of Birth: July 26, 1951  Today's Date: 03/12/2022 OT Individual Time: 1050-1200 OT Individual Time Calculation (min): 70 min    Short Term Goals: Week 1:  OT Short Term Goal 1 (Week 1): Pt will sit dynamically at EOB or EOM for 10 min with no more than CGA for balance corrections OT Short Term Goal 2 (Week 1): Pt will complete toilet transfer with MAX A of 1 and LRAD OT Short Term Goal 3 (Week 1): Pt will sit to stand with MOD A Overall in prep for LB and toileting tasks OT Short Term Goal 4 (Week 1): Pt will recall hemi strategies with MIN cuing  Skilled Therapeutic Interventions/Progress Updates:    Pt received supine with no c/o pain, agreeable to OT session. His daughter was present initially and reported she would bring in some of pt's personal clothes. Extensive discussion re bowel regiment. Pt refusing miralax daily and only taking colace- provided education on need to continue with a daily supplement if this is his baseline (it is) and need to be proactive rather than reactive to constipation. He came to EOB with mod A for LUE/LE management. Fair sitting balance initially but with fatigue, fading to min A or RUE support on the bed rail. He completed UB bathing with mod A overall. Pt still with great distal movement- hand and improved wrist but little to none in shoulder. One breath subluxation now present. Kinesiotape applied to support glenohumeral stability. He stood from EOB with the stedy with min A. Very kyphotic posture which pt reports is baseline. Pants donned with total A. Pt was transferred to the TIS w/c. In the gym pt completed  1:1 NMES applied to the bicep with active assist provided to facilitate NMR. Cup held in his R hand to engage in more functional elbow flexion pattern. Great activation in the bicep during treatment, fatigue toward the end with less activation.   Ratio  1:3 Rate 35 pps Waveform- Asymmetric Ramp 1.0 Pulse 300 Intensity- 26 Duration - 12 min   Report of pain at the beginning of session 0/10 Report of pain at the end of session 0/10  No adverse reactions after treatment and is skin intact.   He returned to his room and was left sitting up in the TIS with all needs met.     Therapy Documentation Precautions:  Precautions Precautions: Fall, Other (comment) Precaution Comments: L hemi Required Braces or Orthoses: Other Brace (Wrist splint and PRAFO) Other Brace: Wrist splint and PRAFO Restrictions Weight Bearing Restrictions: No      Therapy/Group: Individual Therapy  Curtis Sites 03/12/2022, 7:20 AM

## 2022-03-12 NOTE — Progress Notes (Signed)
Speech Language Pathology Daily Session Note  Patient Details  Name: Patrick Buchanan MRN: [id number] Date of Birth: April 14, 1951  Today's Date: 03/12/2022 SLP Individual Time: 0730-0830 SLP Individual Time Calculation (min): 60 min  Short Term Goals: Week 1: SLP Short Term Goal 1 (Week 1): Pt will complete complex problem solving tasks with min A verbal cues. SLP Short Term Goal 2 (Week 1): Pt will self-monitor and self-correct functional errors with min A verbal cues. SLP Short Term Goal 3 (Week 1): Pt will demonstrate selective attention in 30 minute intervals during complex tasks with supervision A verbal cues.  Skilled Therapeutic Interventions: Skilled slp intervention focused on cognition. Pt was pleasant but particular with many needs when st entered room. He expressed appreciation for assistance with positioning of items on table, brushing teeth, and repositioning pillows in bed. Pt reminded of call light to request help with needs. Pt expressed frustration with having tray dropped off on bedside table at side of bed and not receiving set up-A for eating meals. ST informed RN staff that pt needs set up A with tray over bed and opening items on tray. He recalled purpose for medications when given the name with min-mod A. He required max A to recall frequency of medications he is given. Moderately complex deductive reasoning puzzle completed with supervision A with clues containing explicitly stated information and mod A for clues requiring process of elimination based on solutions already placed in puzzle. He demonstrated good awareness of strategies to increase attention by having ST mark out clues already completed. He participated well in skilled slp intervention. Cont with therapy per plan of care.     Pain Pain Assessment Pain Scale: Faces Faces Pain Scale: No hurt  Therapy/Group: Individual Therapy  Darrol Poke Orli Degrave 03/12/2022, 8:27 AM

## 2022-03-12 NOTE — Progress Notes (Signed)
PROGRESS NOTE   Subjective/Complaints: Understanding that staff is very busy- says his main concern was after he received enema had stool incontinence on bed several times before he could be assisted.   ROS: Patient denies fever, rash, sore throat, blurred vision, dizziness, nausea, vomiting, diarrhea, cough, shortness of breath or chest pain, joint or back/neck pain, headache, or mood change. Constipation has resolved   Objective:   No results found. No results for input(s): WBC, HGB, HCT, PLT in the last 72 hours.  No results for input(s): NA, K, CL, CO2, GLUCOSE, BUN, CREATININE, CALCIUM in the last 72 hours.   Intake/Output Summary (Last 24 hours) at 03/12/2022 1026 Last data filed at 03/12/2022 0830 Gross per 24 hour  Intake 480 ml  Output 150 ml  Net 330 ml        Physical Exam: Vital Signs Blood pressure 110/79, pulse 65, temperature (!) 97.5 F (36.4 C), resp. rate 16, height 6' (1.829 m), weight 92.4 kg, SpO2 97 %. Gen: no distress, normal appearing HEENT: oral mucosa pink and moist, NCAT Cardio: Reg rate Chest: normal effort, normal rate of breathing Abd: soft, non-distended  Ext: no clubbing, cyanosis, or edema Psych: pleasant and cooperative  Skin: Clean and intact without signs of breakdown, scalp incision cdi Neuro:  Alert and oriented x 3. Normal insight and awareness. Intact Memory. Normal language and speech. Cranial nerve exam unremarkable. LUE 0-tr shoulder, bicep, tricep, 1-2/4 wrist and 2-2+/4 finger flexors. RUE 4-5/5. LLE with 1/5 HE, KE and trace at foot. RLE 4/5. Improved extensor tone LLE and  flexor tone in LUE. Seems to sense pain and LT in all four. DTR's 3+ LLE, 2+ LUE. -stable exam Musculoskeletal: left heel cord still tight. No joint pain with ROM.    Assessment/Plan: 1. Functional deficits which require 3+ hours per day of interdisciplinary therapy in a comprehensive inpatient rehab  setting. Physiatrist is providing close team supervision and 24 hour management of active medical problems listed below. Physiatrist and rehab team continue to assess barriers to discharge/monitor patient progress toward functional and medical goals  Care Tool:  Bathing    Body parts bathed by patient: Left arm, Chest, Abdomen, Front perineal area   Body parts bathed by helper: Right arm, Buttocks, Right upper leg, Left upper leg, Right lower leg, Left lower leg     Bathing assist Assist Level: Maximal Assistance - Patient 24 - 49%     Upper Body Dressing/Undressing Upper body dressing   What is the patient wearing?: Pull over shirt    Upper body assist Assist Level: Maximal Assistance - Patient 25 - 49%    Lower Body Dressing/Undressing Lower body dressing      What is the patient wearing?: Pants     Lower body assist Assist for lower body dressing: Total Assistance - Patient < 25%     Toileting Toileting    Toileting assist Assist for toileting: Dependent - Patient 0% Assistive Device Comment: urinal   Transfers Chair/bed transfer  Transfers assist     Chair/bed transfer assist level: Moderate Assistance - Patient 50 - 74%     Locomotion Ambulation   Ambulation assist  Assist level: Maximal Assistance - Patient 25 - 49% Assistive device: Other (comment) (R rail) Max distance: 2 ft   Walk 10 feet activity   Assist  Walk 10 feet activity did not occur: Safety/medical concerns        Walk 50 feet activity   Assist Walk 50 feet with 2 turns activity did not occur: Safety/medical concerns         Walk 150 feet activity   Assist Walk 150 feet activity did not occur: Safety/medical concerns         Walk 10 feet on uneven surface  activity   Assist Walk 10 feet on uneven surfaces activity did not occur: Safety/medical concerns         Wheelchair     Assist Is the patient using a wheelchair?: Yes Type of Wheelchair:  Manual    Wheelchair assist level: Dependent - Patient 0%      Wheelchair 50 feet with 2 turns activity    Assist        Assist Level: Dependent - Patient 0%   Wheelchair 150 feet activity     Assist      Assist Level: Dependent - Patient 0%   Blood pressure 110/79, pulse 65, temperature (!) 97.5 F (36.4 C), resp. rate 16, height 6' (1.829 m), weight 92.4 kg, SpO2 97 %.  Medical Problem List and Plan: 1. 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             -patient may shower but keep incision covered             -ELOS/Goals:  ELOS 14 to 20 days          -Continue CIR therapies including PT, OT, and SLP Discussed his staffing concerns with [facility]   2.  Antithrombotics: -DVT/anticoagulation:  pt with left posterior tib dvt.   -began lovenox '40mg'$  q12  -re-doppler next week  -activity as tolerated             -antiplatelet therapy: none 3. Pain Management: Tylenol, hydrocodone, robaxin as needed             -Continue Gabapentin '100mg'$  BID 4. Mood: team providing emotional support             -antipsychotic agents: n/a             --Depression continue Lexapro 20 mg daily 5. Neuropsych: This patient is capable of making decisions on his own behalf. 6. Skin/Wound Care: Routine skin care checks             -- Monitor surgical incision 7. Fluids/Electrolytes/Nutrition:    -BUN improved yesterday, continue to push fluids  -protein supp for low albumin 9: History of multiple meningiomas/s/p resection:             -- Taper steroid--changed to PO form 5/17--decrease to q8 5/19   -leukocytosis secondary to steroid             -- Continue Keppra and Lamictal             -- Calcium carbonate tablets 3 times daily 10: Dyslipidemia: Continue Crestor 20 mg daily 11: History of GERD: Continue Protonix 40 mg daily 12: DM-2: diet controlled (A1c = 5.7 on 02/22/2022)            -glucose elevation likely  steroid effect 13: Anemia/acute intra-op blood loss: follow-up CBC 14. Constipation: some results 5/17, none since-  -SMOG  enema today  -if no results, will check KUB  -change from MOM to miralax bid  -states bowels moving more easily after enema, continue  magnesium gluconate '250mg'$  HS 15. Spasticity:   night splint for LLE ordered  -continue trial of baclofen '5mg'$  tid for tone-- I see improvement  -splinting, rom with therapies  16. Hypertension:  improved, continue magnesium gluconate '250mg'$  HS    LOS: 5 days A FACE TO FACE EVALUATION WAS PERFORMED  Martha Clan P Avaline Stillson 03/12/2022, 10:26 AM

## 2022-03-13 MED ORDER — BACLOFEN 10 MG PO TABS
10.0000 mg | ORAL_TABLET | Freq: Three times a day (TID) | ORAL | Status: DC
  Administered 2022-03-13 – 2022-03-30 (×51): 10 mg via ORAL
  Filled 2022-03-13 (×51): qty 1

## 2022-03-13 NOTE — Progress Notes (Addendum)
Occupational Therapy Session Note  Patient Details  Name: Patrick Buchanan MRN: [id number] Date of Birth: [birth year]  Today's Date: 03/13/2022 OT Individual Time: 1300-1400 OT Individual Time Calculation (min): 60 min    Short Term Goals: Week 1:  OT Short Term Goal 1 (Week 1): Pt will sit dynamically at EOB or EOM for 10 min with no more than CGA for balance corrections OT Short Term Goal 2 (Week 1): Pt will complete toilet transfer with MAX A of 1 and LRAD OT Short Term Goal 3 (Week 1): Pt will sit to stand with MOD A Overall in prep for LB and toileting tasks OT Short Term Goal 4 (Week 1): Pt will recall hemi strategies with MIN cuing  Skilled Therapeutic Interventions/Progress Updates:    Pt resting in bed upon arrival. Pt reported that he was "pretty worn out" from earlier therapy sessions. OT intervention with focus on bed mobility, LUE NMR, LUE positioning, and activity tolerance to increase independence with BADLs.   Mod A for rolling R/L in bed with pt hesitant to roll onto Lt shoulder. Dependent for changing brief. LUE NMR per below  1:1 NMES applied to the bicep with active assist provided to facilitate NMR. Cup held in his R hand to engage in more functional elbow flexion pattern. Great activation in the bicep during treatment, fatigue toward the end with less activation.    Ratio 1:3 Rate 35 pps Waveform- Asymmetric Ramp 1.0 Pulse 300 Intensity- 24 Duration - 12 min    Report of pain at the beginning of session 0/10 Report of pain at the end of session 0/10   No adverse reactions after treatment and is skin intact.   Pt remained in bed with all needs in place and within reach. Bed alarm activated.   Therapy Documentation Precautions:  Precautions Precautions: Fall, Other (comment) Precaution Comments: L hemi Required Braces or Orthoses: Other Brace (Wrist splint and PRAFO) Other Brace: Wrist splint and PRAFO Restrictions Weight Bearing Restrictions: No    Pain: Pain Assessment Pain Scale: 0-10 Pain Score: 6  Pain Type: Acute pain Pain Location: Shoulder Pain Orientation: Left Pain Descriptors / Indicators: Aching Pain Frequency: Occasional Pain Onset: With Activity Patients Stated Pain Goal: 2 Pain Intervention(s): meds admin during session, repositioned Therapy/Group: Individual Therapy  [name] 03/13/2022, 2:17 PM

## 2022-03-13 NOTE — Progress Notes (Signed)
PROGRESS NOTE   Subjective/Complaints: Says that one of the staff didn't pay close enough attention to him last night. Spasticity improving left side.bowels moving  ROS: Patient denies fever, rash, sore throat, blurred vision, dizziness, nausea, vomiting, diarrhea, cough, shortness of breath or chest pain, joint or back/neck pain, headache, or mood change.    Objective:   No results found. No results for input(s): WBC, HGB, HCT, PLT in the last 72 hours.  No results for input(s): NA, K, CL, CO2, GLUCOSE, BUN, CREATININE, CALCIUM in the last 72 hours.   Intake/Output Summary (Last 24 hours) at 03/13/2022 1133 Last data filed at 03/13/2022 0806 Gross per 24 hour  Intake 240 ml  Output 300 ml  Net -60 ml        Physical Exam: Vital Signs Blood pressure 113/78, pulse 68, temperature 97.8 F (36.6 C), resp. rate 16, height 6' (1.829 m), weight 92.4 kg, SpO2 97 %. Constitutional: No distress . Vital signs reviewed. HEENT: NCAT, EOMI, oral membranes moist Neck: supple Cardiovascular: RRR without murmur. No JVD    Respiratory/Chest: CTA Bilaterally without wheezes or rales. Normal effort    GI/Abdomen: BS +, non-tender, non-distended Ext: no clubbing, cyanosis, or edema Psych: pleasant and cooperative  Skin: Clean and intact without signs of breakdown, scalp incision cdi Neuro:  Alert and oriented x 3. Normal insight and awareness. Intact Memory. Normal language and speech. Cranial nerve exam unremarkable. LUE 0-tr shoulder, bicep, tricep, 1-2/4 wrist and 2-2+/4 finger flexors. RUE 4-5/5. LLE with 1/5 HE, KE and trace at foot. RLE 4/5. Improved extensor tone LLE and minimal  flexor tone in LUE. Seems to sense pain and LT in all four. DTR's 3+ LLE, 2+ LUE. -stable exam Musculoskeletal: left heel cord still tight but was ranged to 90. Marland Kitchen No joint pain with ROM.    Assessment/Plan: 1. Functional deficits which require 3+ hours  per day of interdisciplinary therapy in a comprehensive inpatient rehab setting. Physiatrist is providing close team supervision and 24 hour management of active medical problems listed below. Physiatrist and rehab team continue to assess barriers to discharge/monitor patient progress toward functional and medical goals  Care Tool:  Bathing    Body parts bathed by patient: Left arm, Chest, Abdomen, Front perineal area   Body parts bathed by helper: Right arm, Buttocks, Right upper leg, Left upper leg, Right lower leg, Left lower leg     Bathing assist Assist Level: Maximal Assistance - Patient 24 - 49%     Upper Body Dressing/Undressing Upper body dressing   What is the patient wearing?: Pull over shirt    Upper body assist Assist Level: Maximal Assistance - Patient 25 - 49%    Lower Body Dressing/Undressing Lower body dressing      What is the patient wearing?: Pants     Lower body assist Assist for lower body dressing: Total Assistance - Patient < 25%     Toileting Toileting    Toileting assist Assist for toileting: Dependent - Patient 0% Assistive Device Comment: urinal   Transfers Chair/bed transfer  Transfers assist     Chair/bed transfer assist level: Moderate Assistance - Patient 50 - 74%  Locomotion Ambulation   Ambulation assist      Assist level: Maximal Assistance - Patient 25 - 49% Assistive device: Other (comment) (R rail) Max distance: 2 ft   Walk 10 feet activity   Assist  Walk 10 feet activity did not occur: Safety/medical concerns        Walk 50 feet activity   Assist Walk 50 feet with 2 turns activity did not occur: Safety/medical concerns         Walk 150 feet activity   Assist Walk 150 feet activity did not occur: Safety/medical concerns         Walk 10 feet on uneven surface  activity   Assist Walk 10 feet on uneven surfaces activity did not occur: Safety/medical concerns          Wheelchair     Assist Is the patient using a wheelchair?: Yes Type of Wheelchair: Manual    Wheelchair assist level: Dependent - Patient 0%      Wheelchair 50 feet with 2 turns activity    Assist        Assist Level: Dependent - Patient 0%   Wheelchair 150 feet activity     Assist      Assist Level: Dependent - Patient 0%   Blood pressure 113/78, pulse 68, temperature 97.8 F (36.6 C), resp. rate 16, height 6' (1.829 m), weight 92.4 kg, SpO2 97 %.  Medical Problem List and Plan: 1. 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             -patient may shower but keep incision covered             -ELOS/Goals:  ELOS 14 to 20 days          -Continue CIR therapies including PT, OT, and SLP   2.  Antithrombotics: -DVT/anticoagulation:  pt with left posterior tib dvt.   -began lovenox '40mg'$  q12  -re-doppler wednesday  -activity as tolerated             -antiplatelet therapy: none 3. Pain Management: Tylenol, hydrocodone, robaxin as needed             -Continue Gabapentin '100mg'$  BID 4. Mood: team providing emotional support             -antipsychotic agents: n/a             --Depression continue Lexapro 20 mg daily 5. Neuropsych: This patient is capable of making decisions on his own behalf. 6. Skin/Wound Care: Routine skin care checks             -- Monitor surgical incision 7. Fluids/Electrolytes/Nutrition:    -BUN improved yesterday, continue to push fluids  -protein supp for low albumin 9: History of multiple meningiomas/s/p resection:             -- Taper steroid--changed to PO form 5/17--decrease to q8 5/19   -leukocytosis secondary to steroid             -- Continue Keppra and Lamictal             -- Calcium carbonate tablets 3 times daily 10: Dyslipidemia: Continue Crestor 20 mg daily 11: History of GERD: Continue Protonix 40 mg daily 12: DM-2: diet controlled (A1c = 5.7 on 02/22/2022)             -glucose elevation likely steroid effect 13: Anemia/acute intra-op blood loss: follow-up CBC 14. Constipation: some results 5/17, none  since-  -SMOG enema Friday with results  - continue  magnesium gluconate '250mg'$  HS 15. Spasticity:   night splint for LLE re-ordered 5/22  -continue trial of baclofen '5mg'$  tid for tone-- incr to '10mg'$  tid  -splinting, rom with therapies  16. Hypertension:  improved, continue magnesium gluconate '250mg'$  HS    LOS: 6 days A FACE TO FACE EVALUATION WAS PERFORMED  Meredith Staggers 03/13/2022, 11:33 AM

## 2022-03-13 NOTE — Progress Notes (Signed)
Speech Language Pathology Daily Session Note  Patient Details  Name: Patrick Buchanan MRN: [id number] Date of Birth: 25-Aug-1951  Today's Date: 03/13/2022 SLP Individual Time: 6213-0865 SLP Individual Time Calculation (min): 45 min  Short Term Goals: Week 1: SLP Short Term Goal 1 (Week 1): Pt will complete complex problem solving tasks with min A verbal cues. SLP Short Term Goal 2 (Week 1): Pt will self-monitor and self-correct functional errors with min A verbal cues. SLP Short Term Goal 3 (Week 1): Pt will demonstrate selective attention in 30 minute intervals during complex tasks with supervision A verbal cues.  Skilled Therapeutic Interventions: Skilled ST treatment focused on cognitive goals. SLP facilitated session by providing a complex problem solving and organization task in which the patient had to generate a schedule when given a list of appointments, errands and time constraints. Pt completed task with min A verbal cues for organizing, recalling, and prioritizing pertinent information. SLP wrote down information for patient, as he stated he could write but likely wouldn't be able to read his own writing. Pt described this as an acute change since onset. Pt benefited from min A verbal redirection throughout session for sustained attention to tasks due to intermittent initiation of side conversation. Patient was passed off to PT at end of session. Continue per current plan of care.      Pain  None/denied  Therapy/Group: Individual Therapy  Patty Sermons 03/13/2022, 12:59 PM

## 2022-03-13 NOTE — Progress Notes (Signed)
Physical Therapy Session Note  Patient Details  Name: Patrick Buchanan MRN: [id number] Date of Birth: 29-Jan-1951  Today's Date: 03/13/2022 PT Individual Time: 0800-0900 and 1115-1215 PT Individual Time Calculation (min): 60 min and 60 min  Short Term Goals: Week 1:  PT Short Term Goal 1 (Week 1): Patient will perform bed mobility with mod A in a flat bed with or without use of bed rails. PT Short Term Goal 2 (Week 1): Patient will perform basic transfers with mod A consistently. PT Short Term Goal 3 (Week 1): Patient will ambulate >20 feet using LRAD with max A of 1-2 people. PT Short Term Goal 4 (Week 1): Patient will improve score on Berg Balance Scale by 7 points to meet [name].  Skilled Therapeutic Interventions/Progress Updates:     Session 1: Patient in bed upon PT arrival. Patient alert and agreeable to PT session. Patient reported intermittent 4-5/10 L shoulder pain during session, RN made aware. PT provided repositioning, rest breaks, and distraction as pain interventions throughout session.   Donned L upper extremity subluxation sling for shoulder approximation in dependent positions at beginning of session. Required increased time for adjustment for fit to patient. Educated patient on purpose of the sling to promote use of distal upper extremity and prevent further subluxation at the [facility] joint, as patient's upper extremity motor recovery presents distal>proximal.   Therapeutic Activity: Bed Mobility: Patient performed supine to sit with mod A for L lower extremity management and trunk support. Provided verbal cues for sequencing, progressing through R side-lying, and bringing knees to chest to bring legs off the bed. Transfers: Patient performed a dependent transfer bed>TIS w/c in the [place]. Performed sit to/from stand from bed with min-mod A and CGA-min A from [facility] seat with facilitation for knee knee/hip extension and L trunk elongation with visual target on R. Patient able to  grasp and pull up with L upper extremity with total A for hand placement/removal due to proximal>distal weakness.   Neuromuscular Re-ed: Patient performed the following sitting balance and L lower extremity tone management activities: -sitting balance EOB >6 min with supervision, focused on reduce reliance of R upper extremity on bed rail and increased trunk musculature activation and midline orientation due to L lean with mod-max cues, patient able to self correct, increased time with visual target, focused on postural control due to increased kyphosis in sitting -provided prolonged stretch for L DF and knee extension, progressed through controled PROM out of synergy having patient visualize movement with spontaneous quad and extensor digitorum activation  Patient in TIS w/c at end of session with breaks locked and all needs within reach.   Session 2: Patient in TIS w/c in the room upon PT arrival, tolerated >2.5 hours in the w/c today reporting fatigue and discomfort at this time. Patient alert and agreeable to PT session. Patient reported intermittent 4-5/10 L shoulder pain during session, RN made aware. PT provided repositioning, rest breaks, and distraction as pain interventions throughout session.     Therapeutic Activity: Bed Mobility: Patient performed sit to supine with max A for lower extremity and trunk control. Provided verbal cues for bringing knees to chest and use of R elbow for trunk control. Patient doffed paper scrub pants with total A, demonstrating adductor activation in hook-lying, hip extensor activation for bridging, and self initiated L knee/hip extension to straighten his leg.  Transfers: Patient performed squat pivot TIS w/c>bed with min-mod A to the R using the bed rail. Provided verbal cues for head  hips relationship and blocked L knee for safety.   Neuromuscular Re-ed: Patient performed the following L upper and lower extremity motor control activities with functional  tasks: -sit to stand with min-mod A x5 using B upper extremities with L hand with ACE wrap to the hand grip focused on L weight bearing and knee/hip extension with multimodal cues for motor activation -therapeutic gait training in Lite Gait with ~30% body weight support >40 feet with facilitation for trunk control, weight shifting, and max-total A for L lower extremity management, noted increased hip flexor activation with limb advancement and quad and gluteal activation in stance with multimodal cues and repetition, demonstrates flexed trunk with L lean and crouched posture with fatigue   Patient in bed with multiple pillows placed for L extremity elevation and trunk positioning and L PRAFO donned at end of session with breaks locked, bed alarm set, and all needs within reach.   Therapy Documentation Precautions:  Precautions Precautions: Fall, Other (comment) Precaution Comments: L hemi Required Braces or Orthoses: Other Brace (Wrist splint and PRAFO) Other Brace: Wrist splint and PRAFO Restrictions Weight Bearing Restrictions: No    Therapy/Group: Individual Therapy  Cristen Bredeson L Shaft Corigliano PT, DPT  03/13/2022, 12:21 PM

## 2022-03-13 NOTE — Progress Notes (Signed)
Patient ID: Patrick Buchanan, male   DOB: 06-21-1951, 71 y.o.   MRN: [id number]  SW received phone call from pt dtr Katie who reported that she was concerned based on reports she has received from her father about having to wait 40-45 minutes for someone to assist him with using the bathroom, and he reports he does not think the staff like him. SW shared will inform appropriate staff.   *Update- reports include pt not waiting linger than 10 minutes to have care needs met.  Loralee Pacas, MSW, [place] Office: [phone] Cell: [phone] Fax: [phone]

## 2022-03-13 NOTE — Consult Note (Signed)
Neuropsychological Consultation   Patient:   Patrick Buchanan   DOB:   17-Jul-1951  MR Number:  [id number]  Location:  [place] [address] [place] [id number] [place] [address] Dept: [place]: [phone]           Date of Service:   03/13/2022  Start Time:   9 AM End Time:   10 AM  Provider/Observer:  Ilean Skill, Psy.D.       Clinical Neuropsychologist       Billing Code/Service: 90240  Chief Complaint:    Patrick Buchanan is a 71 year old male with a past medical history including spinal meningioma with neurosurgical interventions.  The patient recently presented with symptomatic multiple cranial meningiomas.  CT scan of head revealed the largest tumor in the right parietal region as well as large left inferior frontal/sphenoid wing tumors.  The patient had reported that he had developed symptoms over many months and had a recent fall with bruising and worsening with left-sided weakness.  Patient was seen by Dr. Trenton Gammon and underwent bilateral parietal craniotomy with resection of tumor greater than 6 cm in diameter, micro dissection, partial craniotomy for resection of tumor invading through the skull.  Patient did tolerate the procedure well.  Postoperative changes revealed minimal bicep and wrist extensor function, proximal function of left upper extremity.  Patient has had some voluntary movement in his left lower extremity and today had some ability to close his hand on the left side but not raise his wrist.  Reason for Service:  Patient was referred for neuropsychological consultation due to residual effects of his recent neurosurgical interventions for multiple cranial meningiomas.  Below is the HPI for the current admission.  HPI: Patrick Buchanan is a 71 year old male with a history of spinal meningioma who presented with symptomatic multiple cranial meningiomas.  CT scan of the head  revealed the largest tumor in the right parietal region as well as large left inferior frontal/sphenoid wing tumors.  The patient stated his symptoms have developed over many months and had a recent fall with bruising and reporting worsening gait.  He had significant left-sided weakness. Arrangements were made for admission for surgical resection.  On the day of admission the patient was taken to the operating room by Dr. Deri Fuelling and underwent bilateral parietal craniotomy with resection of parasagittal tumor greater than 6 cm in diameter, microdissection, partial craniectomy for resection of tumor invading through the skull.  He tolerated the procedure well.  He required intraoperative blood transfusion secondary to significant intraoperative blood loss and associated anemia.  Postoperative physical exam revealed minimal triceps and wrist extensor function, proximal function of left upper extremity.  He had some voluntary movement of the left lower extremity but no functional use per exam on 5/16.  Vital signs were stable and he was afebrile.  Steroids were weaned.  PRAFO boot for left foot and ankle and Velcro wrist splint for left upper extremity. The patient requires inpatient physical medicine and rehabilitation evaluations and treatment secondary to dysfunction due to right parasagittal region tumor s/p resection.   The patient has a history of paraspinal meningioma resection.  He is followed as an outpatient by Dr. Marcial Pacas.  His medical history is significant for prior history of aneurysm clipping 1992, depression, diabetes type 2, gastroesophageal reflux disease, dyslipidemia.   Pt reports his voice is a little hoarse since the surgery. He has not had a BM in  a bout 2 days.   Current Status:  The patient was awake and alert sitting in his wheelchair waiting for my arrival as I entered the room.  The patient displayed good receptive and expressive language although noted that he was still hoarse  post surgery but it is improved somewhat.  Patient described prior difficulties with his neck from previous surgeries.  He was aware of his motor deficits that continued but felt overall that cognition was improving significantly.  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  He knew of and had worked with several of my family members and identified my last name and had questions about how they were related to me.  He recanted many stories about them from memory.  The patient talked about his retirement in 2016 but his ongoing efforts to continue to be quite active.  Cognition was good throughout with only episodic lapses of attention.  The patient showed very good self-awareness and he was clearly aware of his residual motor deficits but denied significant sensory deficits.  Patient admitted to significant stress associated with his neurological condition.  Behavioral Observation: Patrick Buchanan  presents as a 71 y.o.-year-old Right handed Caucasian Male who appeared his stated age. his dress was Appropriate and he was Well Groomed and his manners were Appropriate to the situation.  his participation was indicative of Appropriate and Redirectable behaviors.  There were physical disabilities noted.  he displayed an appropriate level of cooperation and motivation.     Interactions:    Active Appropriate  Attention:   abnormal and attention span appeared shorter than expected for age  Memory:   within normal limits; recent and remote memory intact  Visuo-spatial:  not examined  Speech (Volume):  low  Speech:   normal; some raspiness to his voice  Thought Process:  Coherent and Relevant  Though Content:  WNL; not suicidal and not homicidal  Orientation:   person, place, time/date, and  situation  Judgment:   Good  Planning:   Good  Affect:    Appropriate  Mood:    Euthymic  Insight:   Good  Intelligence:   very high    Medical History:   Past Medical History:  Diagnosis Date   Allergy    hay fever   Blood transfusion without reported diagnosis    Cerebral aneurysm 1992   "leaking"; treated by Dr Sherwood Gambler   Depression    Diabetes mellitus without complication ([place])    type 2   GERD (gastroesophageal reflux disease)    past hx          Patient Active Problem List   Diagnosis Date Noted   Brain tumor ([place]) 02/27/2022   Meningioma ([place]) 02/27/2022   Left hemiparesis ([place]) 01/06/2022   History of cerebral aneurysm 01/06/2022   Gait abnormality 01/06/2022   Mixed diabetic hyperlipidemia associated with type 2 diabetes mellitus ([place]) 11/18/2019   Diabetes mellitus ([place]) 11/18/2019   Counseling on health promotion and disease prevention 11/18/2019   History of smoking for 6-10 years 10/29/2019   Pain in right knee 08/25/2019   Vitamin D insufficiency 08/11/2019   Hypertriglyceridemia 08/11/2019   Prediabetes 08/11/2019   Serum sodium elevated 08/11/2019   dysthymia 10/28/2018   (BMI 30.0-34.9) 10/28/2018   Elevated blood pressure reading- no dx HTN 10/28/2018   Family history of diabetes mellitus (DM) 10/28/2018    Psychiatric History:  No prior psychiatric history  Family Med/Psych History:  Family History  Problem Relation Age of Onset   Breast cancer Mother    Diabetes Brother    Colon cancer Neg Hx    Colon polyps Neg Hx    Esophageal cancer Neg Hx    Rectal cancer Neg Hx    Stomach cancer Neg Hx    Impression/DX:  Patrick Buchanan is a 71 year old male with a past medical history including spinal meningioma with neurosurgical interventions.  The patient recently presented with symptomatic multiple cranial meningiomas.  CT scan of head revealed the largest tumor in the right parietal region as well as large left inferior  frontal/sphenoid wing tumors.  The patient had reported that he had developed symptoms over many months and had a recent fall with bruising and worsening with left-sided weakness.  Patient was seen by Dr. Trenton Gammon and underwent bilateral parietal craniotomy with resection of tumor greater than 6 cm in diameter, micro dissection, partial craniotomy for resection of tumor invading through the skull.  Patient did tolerate the procedure well.  Postoperative changes revealed minimal bicep and wrist extensor function, proximal function of left upper extremity.  Patient has had some voluntary movement in his left lower extremity and today had some ability to close his hand on the left side but not raise his wrist.  The patient was awake and alert sitting in his wheelchair waiting for my arrival as I entered the room.  The patient displayed good receptive and expressive language although noted that he was still hoarse post surgery but it is improved somewhat.  Patient described prior difficulties with his neck from previous surgeries.  He was aware of his motor deficits that continued but felt overall that cognition was improving significantly.  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  He knew of and had worked with several of my family members and identified my last name and had questions about how they were related to me.  He recanted many stories about them from memory.  The patient talked about his retirement in 2016 but his ongoing efforts to continue to be quite active.  Cognition was good throughout with only episodic lapses of attention.  The patient showed very good self-awareness and he was clearly aware of his residual motor deficits but denied significant sensory deficits.  Patient admitted to significant stress associated with his neurological condition.  Disposition/Plan:  Today we worked on coping and adjustment issues  with residual neurological deficits following bilateral parietal craniotomy with resection of multiple meningiomas.  I will follow-up with the patient if needed and have let him know that I will be happy to see him outpatient if he were to continue to struggle or have residual cognitive issues that develop as he recovers and returns back home.           Electronically Signed   _______________________ Ilean Skill, Psy.D. Clinical Neuropsychologist

## 2022-03-13 NOTE — Progress Notes (Signed)
Orthopedic Tech Progress Note Patient Details:  Patrick Buchanan 07-Jun-1951 [id number]  Called in order to [facility] for a NIGHT SPLINT   Patient ID: Patrick Buchanan, male   DOB: Feb 01, 1951, 71 y.o.   MRN: [id number]  Patrick Buchanan 03/13/2022, 11:52 AM

## 2022-03-14 ENCOUNTER — Encounter: Payer: Self-pay | Admitting: Physician Assistant

## 2022-03-14 ENCOUNTER — Inpatient Hospital Stay (HOSPITAL_COMMUNITY): Payer: Medicare HMO

## 2022-03-14 LAB — BASIC METABOLIC PANEL
Anion gap: 8 (ref 5–15)
BUN: 25 mg/dL — ABNORMAL HIGH (ref 8–23)
CO2: 26 mmol/L (ref 22–32)
Calcium: 8.8 mg/dL — ABNORMAL LOW (ref 8.9–10.3)
Chloride: 102 mmol/L (ref 98–111)
Creatinine, Ser: 0.84 mg/dL (ref 0.61–1.24)
GFR, Estimated: >60 mL/min (ref >60)
Glucose, Bld: 162 mg/dL — ABNORMAL HIGH (ref 70–99)
Potassium: 4.2 mmol/L (ref 3.5–5.1)
Sodium: 136 mmol/L (ref 135–145)

## 2022-03-14 LAB — CBC
HCT: 35.3 % — ABNORMAL LOW (ref 39.0–52.0)
Hemoglobin: 11.6 g/dL — ABNORMAL LOW (ref 13.0–17.0)
MCH: 30.9 pg (ref 26.0–34.0)
MCHC: 32.9 g/dL (ref 30.0–36.0)
MCV: 93.9 fL (ref 80.0–100.0)
Platelets: 204 10*3/uL (ref 150–400)
RBC: 3.76 MIL/uL — ABNORMAL LOW (ref 4.22–5.81)
RDW: 13.9 % (ref 11.5–15.5)
WBC: 11.4 10*3/uL — ABNORMAL HIGH (ref 4.0–10.5)
nRBC: 0 % (ref 0.0–0.2)

## 2022-03-14 MED ORDER — DEXAMETHASONE 2 MG PO TABS
2.0000 mg | ORAL_TABLET | Freq: Two times a day (BID) | ORAL | Status: DC
Start: 2022-03-14 — End: 2022-03-16
  Administered 2022-03-14 – 2022-03-16 (×4): 2 mg via ORAL
  Filled 2022-03-14 (×4): qty 1

## 2022-03-14 MED ORDER — SORBITOL 70 % SOLN
60.0000 mL | Status: AC
  Administered 2022-03-14: 60 mL via ORAL
  Filled 2022-03-14: qty 60

## 2022-03-14 MED ORDER — SENNOSIDES-DOCUSATE SODIUM 8.6-50 MG PO TABS
1.0000 | ORAL_TABLET | Freq: Two times a day (BID) | ORAL | Status: DC
  Administered 2022-03-14 – 2022-03-21 (×11): 1 via ORAL
  Filled 2022-03-14 (×14): qty 1

## 2022-03-14 NOTE — Progress Notes (Signed)
Occupational Therapy Session Note  Patient Details  Name: Patrick Buchanan MRN: [id number] Date of Birth: 1951-08-14  Today's Date: 03/14/2022 OT Individual Time: 0900-1000 OT Individual Time Calculation (min): 60 min    Short Term Goals: Week 1:  OT Short Term Goal 1 (Week 1): Pt will sit dynamically at EOB or EOM for 10 min with no more than CGA for balance corrections OT Short Term Goal 2 (Week 1): Pt will complete toilet transfer with MAX A of 1 and LRAD OT Short Term Goal 3 (Week 1): Pt will sit to stand with MOD A Overall in prep for LB and toileting tasks OT Short Term Goal 4 (Week 1): Pt will recall hemi strategies with MIN cuing  Skilled Therapeutic Interventions/Progress Updates:     Pt received in bed with no pain reported. Overall with learned helplessness and pt very particular with set up of needs.   ADL: Pt completes ADL at overall MAX A Level. Skilled interventions include: use of wash cloths to wash hair, edu re hemi dressing, with MAX A for components for dressing and MOD A sit to stand with L knee block and using mirror for awareness of upright posture with MAX cuing for attention and facilitation of hip and knee extension. Pt requires overall encouragement to attempt to functionally use LUE and LE. Pt requires HOH A to improve reach to gather grooming items at sink. Pt fearful of showering and practiced transfering with stedy to/from TTB in shower and then to TIS to do a "dry run" to improve comfort with potentially showering.   Pt left at end of session in TIS with exit alarm on, call light in reach and all needs met   Therapy Documentation Precautions:  Precautions Precautions: Fall, Other (comment) Precaution Comments: L hemi Required Braces or Orthoses: Other Brace (Wrist splint and PRAFO) Other Brace: Wrist splint and PRAFO Restrictions Weight Bearing Restrictions: No General:    Therapy/Group: Individual Therapy  [name] 03/14/2022,  6:55 AM

## 2022-03-14 NOTE — Progress Notes (Signed)
Speech Language Pathology Daily Session Note  Patient Details  Name: SAMRAT HAYWARD MRN: [id number] Date of Birth: 1950-11-12  Today's Date: 03/14/2022 SLP Individual Time: 0802-0900 SLP Individual Time Calculation (min): 58 min  Short Term Goals: Week 1: SLP Short Term Goal 1 (Week 1): Pt will complete complex problem solving tasks with min A verbal cues. SLP Short Term Goal 2 (Week 1): Pt will self-monitor and self-correct functional errors with min A verbal cues. SLP Short Term Goal 3 (Week 1): Pt will demonstrate selective attention in 30 minute intervals during complex tasks with supervision A verbal cues.  Skilled Therapeutic Interventions: Skilled ST treatment focused on cognitive goals. SLP faciolitated session by administering the ALFA subtest "solving daily math problems" with 100% accuracy (10/10) in an efficient manner without use of supports. SLP facilitated mildly complex working memory, sustained attention, and abstract reasoning task with overall sup A verbal cues for recall and redirection to task. Pt demonstrated selective attention to therapy tasks for duration of session with intermittent breaks between tasks with sup A verbal redirection cues. At end of session pt reported decreased memory as evidenced by "I don't remember what I tell people", and was noted to share the same story from yesterday with minimal awareness. Patient was left in bed with alarm activated and immediate needs within reach at end of session. Continue per current plan of care.       Pain Pain Assessment Pain Scale: 0-10 Pain Score: 2  Faces Pain Scale: No hurt Pain Location: Shoulder Pain Orientation: Left Pain Descriptors / Indicators: Aching;Discomfort Pain Intervention(s): Refused  Therapy/Group: Individual Therapy  Patty Sermons 03/14/2022, 9:14 AM

## 2022-03-14 NOTE — Patient Care Conference (Signed)
Inpatient RehabilitationTeam Conference and Plan of Care Update Date: 03/14/2022   Time: 10:15 AM    Patient Name: Patrick Buchanan      Medical Record Number: [id number]  Date of Birth: [birth year] Sex: Male         Room/Bed: 4W17C/4W17C-01 Payor Info: Payor: [employer] MEDICARE / Plan: [facility] MEDICARE HMO / Product Type: *No Product type* /    Admit Date/Time:  03/07/2022  2:58 PM  Primary Diagnosis:  Meningioma [facility])  Hospital Problems: Principal Problem:   Meningioma [facility])    Expected Discharge Date: Expected Discharge Date: 03/30/22  Team Members Present: Physician leading conference: Dr. Alger Simons Social Worker Present: Loralee Pacas, [place] Nurse Present: Dorthula Nettles, RN PT Present: Apolinar Junes, PT OT Present: Mariane Masters, OT SLP Present: Weston Anna, SLP PPS Coordinator present : Gunnar Fusi, SLP     Current Status/Progress Goal Weekly Team Focus  Bowel/Bladder   continent b/b  remain continent  toilet as needed   Swallow/Nutrition/ Hydration             ADL's   Can be supervision but with fatigue min A with sitting balance. Min A in the stedy, mod A for midline orientation. Distal activation in the hand and wrist, poor proximal activation and already with 1 breath subluxation  min A overall  Midline orientation, ADL retraining, LUE NMR, ADL transfers   Mobility   Mod A overall, initiated therapeutic gait in Lite Gait >50 feet, limited volitional movement of L lower extremity limited to hip flexor and quads, reflexive extension of great toe, LLE extensor tone improving  CGA-min A overall  balance, midline orientation, activity tolerance, L hemi-body NMR, functional mobility, therapeutic gait training, patient/caregiver education   Communication             Safety/Cognition/ Behavioral Observations  sup-to-min A - doing well with higher level cognitive tasks. Noted decreased short-term recall, shared same story day-to-day  mod I   sustained/selective attention, complex problem solving, emergent/anticipatory awareness   Pain   no reports of pain  remain pain free  assess pain q 4 hr and prn   Skin   incision to head  no new breakdown  assess skin q shift and prn     Discharge Planning:  Pt to d/c to home with support from daughters and hired help.   Team Discussion: Right Meningioma with resection. Splint received. Adjusting medications. Working on constipation again. Continent B/B, no reports of pain. Incision with no s/s of infection or breakdown. Daughters to assist with the assistance of a hired Engineer, production. Flat affect, possible learned helplessness. Left shoulder beginning to sublux.  Patient on target to meet rehab goals: yes, min assist goals. Currently min assist with steady, mod/max STS. Mod assist transfers. Sitting balance improved. Ambulated 14 ft with rail, 50 ft in [place]. Min assist with higher level cognition.  *See Care Plan and progress notes for long and short-term goals.   Revisions to Treatment Plan:  Working on constipation, adjusting medications.   Teaching Needs: Family education, medication management, skin/wound care, transfer/gait training, etc.   Current Barriers to Discharge: Decreased caregiver support, Wound care, Lack of/limited family support, and Insurance for SNF coverage  Possible Resolutions to Barriers: Family education Family to provide aide for assistance Follow-up therapy     Medical Summary Current Status: right meningioma s/p resection. weaning steroids, spasticity better. constipation  Barriers to Discharge: Medical stability   Possible Resolutions to [employer] Focus: daily assessment of labs and pt  data, normalize bowel pattern   Continued Need for Acute Rehabilitation Level of Care: The patient requires daily medical management by a physician with specialized training in physical medicine and rehabilitation for the following reasons: Direction of a  multidisciplinary physical rehabilitation program to maximize functional independence : Yes Medical management of patient stability for increased activity during participation in an intensive rehabilitation regime.: Yes Analysis of laboratory values and/or radiology reports with any subsequent need for medication adjustment and/or medical intervention. : Yes   I attest that I was present, lead the team conference, and concur with the assessment and plan of the team.   Cristi Loron 03/14/2022, 2:33 PM

## 2022-03-14 NOTE — Plan of Care (Signed)
  Problem: RH Memory Goal: LTG Patient will use memory compensatory aids to (SLP) Description: LTG:  Patient will use memory compensatory aids to recall biographical/new, daily complex information with cues (SLP) Flowsheets (Taken 03/14/2022 1218) LTG: Patient will use memory compensatory aids to (SLP): Modified Independent

## 2022-03-14 NOTE — Progress Notes (Signed)
Pts Ortho device applied to the LLE. Pt tolerating well at this time. All needs met. Call light in reach

## 2022-03-14 NOTE — Progress Notes (Signed)
PROGRESS NOTE   Subjective/Complaints: Had a reasonable night. Hasn't moved bowels since 5/20. No new complaints today  ROS: Patient denies fever, rash, sore throat, blurred vision, dizziness, nausea, vomiting, diarrhea, cough, shortness of breath or chest pain,  back/neck pain, headache, or mood change.    Objective:   No results found. Recent Labs    03/14/22 0530  WBC 11.4*  HGB 11.6*  HCT 35.3*  PLT 204    Recent Labs    03/14/22 0530  NA 136  K 4.2  CL 102  CO2 26  GLUCOSE 162*  BUN 25*  CREATININE 0.84  CALCIUM 8.8*     Intake/Output Summary (Last 24 hours) at 03/14/2022 1155 Last data filed at 03/13/2022 2125 Gross per 24 hour  Intake 653 ml  Output --  Net 653 ml        Physical Exam: Vital Signs Blood pressure 103/74, pulse 64, temperature 97.6 F (36.4 C), resp. rate 20, height 6' (1.829 m), weight 92.4 kg, SpO2 98 %. Constitutional: No distress . Vital signs reviewed. HEENT: NCAT, EOMI, oral membranes moist Neck: supple Cardiovascular: RRR without murmur. No JVD    Respiratory/Chest: CTA Bilaterally without wheezes or rales. Normal effort    GI/Abdomen: BS +, non-tender, non-distended Ext: no clubbing, cyanosis, or edema Psych: pleasant and cooperative  Skin: Clean and intact without signs of breakdown, scalp remains incision cdi Neuro:  Alert and oriented x 3. Fair insight and awareness. Intact Memory. Normal language and speech. Cranial nerve exam unremarkable. LUE 0-tr shoulder, bicep, tricep, 3/5 wrist and 3/5 finger flexors. RUE 4-5/5. LLE with 1/5 HE, KE and trace at foot. RLE 4/5. Minimal extensor tone LLE and minimal  flexor tone in LUE. Seems to sense pain and LT in all four. DTR's 3+ LLE, 2+ LUE. -stable exam Musculoskeletal: left heel cord still tight but can be ranged to 90. Marland Kitchen No joint pain with ROM.    Assessment/Plan: 1. Functional deficits which require 3+ hours per day of  interdisciplinary therapy in a comprehensive inpatient rehab setting. Physiatrist is providing close team supervision and 24 hour management of active medical problems listed below. Physiatrist and rehab team continue to assess barriers to discharge/monitor patient progress toward functional and medical goals  Care Tool:  Bathing    Body parts bathed by patient: Left arm, Chest, Abdomen, Front perineal area   Body parts bathed by helper: Right arm, Buttocks, Right upper leg, Left upper leg, Right lower leg, Left lower leg     Bathing assist Assist Level: Maximal Assistance - Patient 24 - 49%     Upper Body Dressing/Undressing Upper body dressing   What is the patient wearing?: Pull over shirt    Upper body assist Assist Level: Maximal Assistance - Patient 25 - 49%    Lower Body Dressing/Undressing Lower body dressing      What is the patient wearing?: Pants     Lower body assist Assist for lower body dressing: Total Assistance - Patient < 25%     Toileting Toileting    Toileting assist Assist for toileting: Dependent - Patient 0% Assistive Device Comment: urinal   Transfers Chair/bed transfer  Transfers assist  Chair/bed transfer assist level: Moderate Assistance - Patient 50 - 74%     Locomotion Ambulation   Ambulation assist      Assist level: Maximal Assistance - Patient 25 - 49% Assistive device: Other (comment) (R rail) Max distance: 2 ft   Walk 10 feet activity   Assist  Walk 10 feet activity did not occur: Safety/medical concerns        Walk 50 feet activity   Assist Walk 50 feet with 2 turns activity did not occur: Safety/medical concerns         Walk 150 feet activity   Assist Walk 150 feet activity did not occur: Safety/medical concerns         Walk 10 feet on uneven surface  activity   Assist Walk 10 feet on uneven surfaces activity did not occur: Safety/medical concerns         Wheelchair     Assist Is  the patient using a wheelchair?: Yes Type of Wheelchair: Manual    Wheelchair assist level: Dependent - Patient 0%      Wheelchair 50 feet with 2 turns activity    Assist        Assist Level: Dependent - Patient 0%   Wheelchair 150 feet activity     Assist      Assist Level: Dependent - Patient 0%   Blood pressure 103/74, pulse 64, temperature 97.6 F (36.4 C), resp. rate 20, height 6' (1.829 m), weight 92.4 kg, SpO2 98 %.  Medical Problem List and Plan: 1. 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             -patient may shower               -ELOS/Goals:  ELOS 14 to 20 days         -Continue CIR therapies including PT, OT, and SLP. Interdisciplinary team conference today to discuss goals, barriers to discharge, and dc planning.    2.  Antithrombotics: -DVT/anticoagulation:  pt with left posterior tib dvt.   -began lovenox '40mg'$  q12  -re-doppler 5/24  -activity as tolerated             -antiplatelet therapy: none 3. Pain Management: Tylenol, hydrocodone, robaxin as needed             -Continue Gabapentin '100mg'$  BID 4. Mood: team providing emotional support             -antipsychotic agents: n/a             --Depression continue Lexapro 20 mg daily 5. Neuropsych: This patient is capable of making decisions on his own behalf. 6. Skin/Wound Care: Routine skin care checks             -- Monitor surgical incision 7. Fluids/Electrolytes/Nutrition:    -BUN improved yesterday, continue to push fluids  -protein supp for low albumin 9: History of multiple meningiomas/s/p resection:             -- Taper steroid--changed to PO form 5/17-->weaning decadron to off   -leukocytosis secondary to steroid             -- Continue Keppra and Lamictal             -- Calcium carbonate tablets 3 times daily 10: Dyslipidemia: Continue Crestor 20 mg daily 11: History of GERD: Continue Protonix 40 mg daily 12: DM-2:  diet controlled (A1c = 5.7 on 02/22/2022)            -  glucose elevation likely steroid effect 13: Anemia/acute intra-op blood loss: follow-up CBC 14. Constipation: some results 5/17, none since-  -SMOG enema Friday with results, last bm 5/20  - continue  magnesium gluconate '250mg'$  HS  -sorbitol today, increase senna-s 15. Spasticity:   night splint for LLE re-ordered 5/22  -continue trial of baclofen '5mg'$  tid for tone-- increased to '10mg'$  tid  -splinting, rom with therapies  16. Hypertension:  improved, continue magnesium gluconate '250mg'$  HS    LOS: 7 days A FACE TO FACE EVALUATION WAS PERFORMED  Meredith Staggers 03/14/2022, 11:55 AM

## 2022-03-14 NOTE — Progress Notes (Addendum)
Physical Therapy Session Note  Patient Details  Name: Patrick Buchanan MRN: [id number] Date of Birth: 1950/12/05  Today's Date: 03/14/2022 PT Individual Time: 1030-1100 and 1333-1435 PT Individual Time Calculation (min): 30 min and 61 min  Short Term Goals: Week 1:  PT Short Term Goal 1 (Week 1): Patient will perform bed mobility with mod A in a flat bed with or without use of bed rails. PT Short Term Goal 2 (Week 1): Patient will perform basic transfers with mod A consistently. PT Short Term Goal 3 (Week 1): Patient will ambulate >20 feet using LRAD with max A of 1-2 people. PT Short Term Goal 4 (Week 1): Patient will improve score on Berg Balance Scale by 7 points to meet [name].  Skilled Therapeutic Interventions/Progress Updates:     Session 1: Patient in TIS w/c upon PT arrival. Patient alert and agreeable to PT session. Patient reported 4-5/10 L shoulder pain during session, RN made aware. PT provided repositioning, rest breaks, and distraction as pain interventions throughout session.   Spent increased time donning L upper extremity subluxation sling for shoulder approximation in dependent positions at beginning of session. Required increased time for adjustment for fit to patient to promote optimal approximation of the joint. Patient reports poor L foot placement in the TIS w/c. Unable to adjust foot rest to accommodate L PF tone. Donned DF adjustable night splint with improved DF and positioning of he foot in the leg rest. Recommended having night splint or shoes donned in sitting for improved sitting tolerance and positioning.   Therapeutic Activity: Bed Mobility: Patient performed sit to supine with max A for lower extremity and trunk control. Provided verbal cues for bringing knees to chest and use of R elbow for trunk control. Patient doffed paper scrub pants with total A, demonstrating adductor activation in hook-lying, hip extensor activation for bridging, and self initiated L  knee/hip extension to straighten his leg.  Transfers: Patient performed stand pivot TIS w/c>bed with min-mod A to the R using the bed rail. Provided verbal cues for head hips relationship and blocked L knee for safety.  Patient in bed with multiple pillows placed for L extremity elevation and trunk positioning and L Night Splint donned at end of session with breaks locked, bed alarm set, and all needs within reach.   Session 2: Patient in bed upon PT arrival. Patient alert and agreeable to PT session. Patient reported 7/10 L shoulder pain during session, declined pain medication at this time. PT provided repositioning, rest breaks, and distraction as pain interventions throughout session.   Doffed L shoulder sling during session due to no improvement in shoulder pain. Plan to trial Giv Mohr sling tomorrow.   Therapeutic Activity: Bed Mobility: Patient performed supine to sit with mod-min A and increased time for L lower extremity management and trunk support. Provided verbal cues for sequencing, progressing through R side-lying, and bringing knees to chest to bring legs off the bed. Patient performed sit to supine with max A for lower extremity and trunk control. Provided verbal cues for bringing knees to chest and use of R elbow for trunk control. Patient doffed paper scrub pants with total A, demonstrating adductor activation in hook-lying, hip extensor activation for bridging, and self initiated L knee/hip extension to straighten his leg.  Transfers: Patient performed stand pivot bed<>TIS w/c with mod-max A, increased assist on return to bed due to R leg buckling leading to sudden drop to the bad with PT controlling descent. Provided verbal cues  for forward weight shift, hip/knee extension L>R, and blocking and facilitation L foot pivot and preventing knee buckling.  Neuromuscular Re-ed: Patient performed the following L upper and lower extremity motor control activities with functional  tasks: -sit to stand and standing balance 30-90 sec with min-mod A x5 using B upper extremities with L hand with ACE wrap to the hand grip focused on L weight bearing and knee/hip extension with multimodal cues for motor activation -therapeutic gait training in Lite Gait with ~30% body weight support >40 feet with facilitation for trunk control, weight shifting, and max-total A for L lower extremity management, noted increased hip flexor activation with limb advancement and quad and gluteal activation in stance with multimodal cues and repetition, demonstrates flexed trunk with L lean and crouched posture with fatigue  Patient in bed with multiple pillows placed for L extremity elevation and trunk positioning and L DF adjustable night splint donned at end of session with breaks locked, bed alarm set, and all needs within reach.   Therapy Documentation Precautions:  Precautions Precautions: Fall, Other (comment) Precaution Comments: L hemi Required Braces or Orthoses: Other Brace (Wrist splint and PRAFO) Other Brace: Wrist splint and PRAFO Restrictions Weight Bearing Restrictions: No    Therapy/Group: Individual Therapy  Halona Amstutz L Danyelle Brookover PT, DPT  03/14/2022, 4:10 PM

## 2022-03-14 NOTE — Progress Notes (Signed)
Patient ID: Patrick Buchanan, male   DOB: 1951-10-10, 71 y.o.   MRN: [id number]  SW met with pt in room to provide updates from team conference, and d/c date 6/8. Pt aware SW will follow-up with his dtr [name]. SW will provide HHA list for him to review.   1721-SW made contact with pt dtr [name] with above updates. SW provided updates from previous days concern. SW encouraged continual family education for ability to see progress he is making in rehab. Fam edu scheduled for Friday (5/26) 1pm-3:30pm. She intends to speak with her sister [name] to ask if she can fly in a few days early for family edu on Tuesday (6/6). She will confirm with [place], MSW, [place] Office: [phone] Cell: [phone] Fax: [phone]

## 2022-03-15 ENCOUNTER — Inpatient Hospital Stay (HOSPITAL_COMMUNITY): Payer: Medicare HMO

## 2022-03-15 DIAGNOSIS — Z86718 Personal history of other venous thrombosis and embolism: Secondary | ICD-10-CM

## 2022-03-15 NOTE — Progress Notes (Signed)
PROGRESS NOTE   Subjective/Complaints: Had a good night. Didn't move bowels yesterday. Had questions about his blood sugars. Tolerated night splint most of night  ROS: Patient denies fever, rash, sore throat, blurred vision, dizziness, nausea, vomiting, diarrhea, cough, shortness of breath or chest pain, joint or back/neck pain, headache, or mood change.    Objective:   No results found. Recent Labs    03/14/22 0530  WBC 11.4*  HGB 11.6*  HCT 35.3*  PLT 204    Recent Labs    03/14/22 0530  NA 136  K 4.2  CL 102  CO2 26  GLUCOSE 162*  BUN 25*  CREATININE 0.84  CALCIUM 8.8*     Intake/Output Summary (Last 24 hours) at 03/15/2022 0859 Last data filed at 03/15/2022 0700 Gross per 24 hour  Intake 596 ml  Output 475 ml  Net 121 ml        Physical Exam: Vital Signs Blood pressure 111/72, pulse 61, temperature (!) 97.4 F (36.3 C), resp. rate 17, height 6' (1.829 m), weight 92.4 kg, SpO2 97 %. Constitutional: No distress . Vital signs reviewed. HEENT: NCAT, EOMI, oral membranes moist Neck: supple Cardiovascular: RRR without murmur. No JVD    Respiratory/Chest: CTA Bilaterally without wheezes or rales. Normal effort    GI/Abdomen: BS +, non-tender, non-distended Ext: no clubbing, cyanosis, or edema Psych: pleasant and cooperative  Skin: Clean and intact without signs of breakdown, scalp remains incision cdi Neuro:  Alert and oriented x 3. Fair insight and awareness. Intact Memory. Normal language and speech. Cranial nerve exam unremarkable. LUE 0-tr shoulder, bicep, tricep, 3/5 wrist and 3/5 finger flexors. RUE 4-5/5. LLE with 1/5 HE, KE and trace at foot. RLE 4/5. Minimal extensor tone LLE and minimal  flexor tone in LUE. Seems to sense pain and LT in all four. DTR's 3+ LLE, 2+ LUE. -stable exam Musculoskeletal: left heel cord more easily ranged to 90+.. No joint pain with ROM.    Assessment/Plan: 1.  Functional deficits which require 3+ hours per day of interdisciplinary therapy in a comprehensive inpatient rehab setting. Physiatrist is providing close team supervision and 24 hour management of active medical problems listed below. Physiatrist and rehab team continue to assess barriers to discharge/monitor patient progress toward functional and medical goals  Care Tool:  Bathing    Body parts bathed by patient: Left arm, Chest, Abdomen, Front perineal area   Body parts bathed by helper: Right arm, Buttocks, Right upper leg, Left upper leg, Right lower leg, Left lower leg     Bathing assist Assist Level: Maximal Assistance - Patient 24 - 49%     Upper Body Dressing/Undressing Upper body dressing   What is the patient wearing?: Pull over shirt    Upper body assist Assist Level: Maximal Assistance - Patient 25 - 49%    Lower Body Dressing/Undressing Lower body dressing      What is the patient wearing?: Pants     Lower body assist Assist for lower body dressing: Total Assistance - Patient < 25%     Toileting Toileting    Toileting assist Assist for toileting: Dependent - Patient 0% Assistive Device Comment: urinal  Transfers Chair/bed transfer  Transfers assist     Chair/bed transfer assist level: Moderate Assistance - Patient 50 - 74%     Locomotion Ambulation   Ambulation assist      Assist level: Maximal Assistance - Patient 25 - 49% Assistive device: Other (comment) (R rail) Max distance: 2 ft   Walk 10 feet activity   Assist  Walk 10 feet activity did not occur: Safety/medical concerns        Walk 50 feet activity   Assist Walk 50 feet with 2 turns activity did not occur: Safety/medical concerns         Walk 150 feet activity   Assist Walk 150 feet activity did not occur: Safety/medical concerns         Walk 10 feet on uneven surface  activity   Assist Walk 10 feet on uneven surfaces activity did not occur:  Safety/medical concerns         Wheelchair     Assist Is the patient using a wheelchair?: Yes Type of Wheelchair: Manual    Wheelchair assist level: Dependent - Patient 0%      Wheelchair 50 feet with 2 turns activity    Assist        Assist Level: Dependent - Patient 0%   Wheelchair 150 feet activity     Assist      Assist Level: Dependent - Patient 0%   Blood pressure 111/72, pulse 61, temperature (!) 97.4 F (36.3 C), resp. rate 17, height 6' (1.829 m), weight 92.4 kg, SpO2 97 %.  Medical Problem List and Plan: 1. 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             -patient may shower               -ELOS/Goals: 03/30/22         -Continue CIR therapies including PT, OT, and SLP  2.  Antithrombotics: -DVT/anticoagulation:  pt with left posterior tib dvt.   -began lovenox '40mg'$  q12  -re-doppler 5/24 (today)  -activity as tolerated             -antiplatelet therapy: none 3. Pain Management: Tylenol, hydrocodone, robaxin as needed             -Continue Gabapentin '100mg'$  BID 4. Mood: team providing emotional support             -antipsychotic agents: n/a             --Depression continue Lexapro 20 mg daily 5. Neuropsych: This patient is capable of making decisions on his own behalf. 6. Skin/Wound Care: Routine skin care checks             -- Monitor surgical incision 7. Fluids/Electrolytes/Nutrition:    - continue to push fluids  -protein supp for low albumin 9: History of multiple meningiomas/s/p resection:             -- gradually tapering steroids off. Discussed effect on CBG's   -leukocytosis secondary to steroid             -- Continue Keppra and Lamictal             -- Calcium carbonate tablets 3 times daily 10: Dyslipidemia: Continue Crestor 20 mg daily 11: History of GERD: Continue Protonix 40 mg daily 12: DM-2: diet controlled (A1c = 5.7 on 02/22/2022)            -glucose  elevation d/w  pt 13: Anemia/acute intra-op blood loss: follow-up CBC 14. Constipation: some results 5/17, none since-  -5/24 no results with sorbitol yesterday. SMOG enema  - continue  magnesium gluconate '250mg'$  HS  - senna-s bid 15. Spasticity:   night splint for LLE, tolerating  -continue trial of baclofen '5mg'$  tid for tone-- increased to '10mg'$  tid  -splinting, rom with therapies   -improved 16. Hypertension:  improved, continue magnesium gluconate '250mg'$  HS    LOS: 8 days A FACE TO FACE EVALUATION WAS PERFORMED  Meredith Staggers 03/15/2022, 8:59 AM

## 2022-03-15 NOTE — Progress Notes (Signed)
Occupational Therapy Session Note  Patient Details  Name: Patrick Buchanan MRN: [id number] Date of Birth: 1951-01-12  Today's Date: 03/15/2022 OT Individual Time: 2297-9892 OT Individual Time Calculation (min): 75 min    Short Term Goals: Week 1:  OT Short Term Goal 1 (Week 1): Pt will sit dynamically at EOB or EOM for 10 min with no more than CGA for balance corrections OT Short Term Goal 2 (Week 1): Pt will complete toilet transfer with MAX A of 1 and LRAD OT Short Term Goal 3 (Week 1): Pt will sit to stand with MOD A Overall in prep for LB and toileting tasks OT Short Term Goal 4 (Week 1): Pt will recall hemi strategies with MIN cuing  Skilled Therapeutic Interventions/Progress Updates:     Pt received in bed with no pain reported. P tcontinues to decline a shower d/t fear, however agreeable to do it on friday  ADL: Pt completes ADL at overall MOD A for UB dressing at supported seated Level. Skilled interventions include: max VC for hemi dressing technique/attention while RN in room while providing medication. Pt requires increased encouragement d/t poor frustration tolerance with decreased independence. Pt requires MOD A t sink for HOH A facilitation of reach of LUE to get toothpaste off top of sink, no A to squeeze. Pt shoulder supported apprximating GH joint d/t sublux. Pt requires increased time for grooming at sink d/t pts thotough nature. Pt towards end of session instructed on doffing strategy with MOD A overall for pulling over head and back. Pt at end of session completes SPT with bed rail and MOD A for OT to manage pants past hips.   Therapeutic activity Seated tabletop towel slides with minimal/trace activation during forward/backwards slides, no bicep/tricep noted. Trialed tipping chair with scapular mobilization from OT, however no activation felt also with decreased attention in busy gym.  Pt left at end of session in bed with exit alarm on, call light in reach and all  needs met   Therapy Documentation Precautions:  Precautions Precautions: Fall, Other (comment) Precaution Comments: L hemi Required Braces or Orthoses: Other Brace (Wrist splint and PRAFO) Other Brace: Wrist splint and PRAFO Restrictions Weight Bearing Restrictions: No General:      Therapy/Group: Individual Therapy  [name] 03/15/2022, 6:54 AM

## 2022-03-15 NOTE — Progress Notes (Signed)
Physical Therapy Weekly Progress Note  Patient Details  Name: Patrick Buchanan MRN: [id number] Date of Birth: 1951/10/11  Beginning of progress report period: Mar 08, 2022 End of progress report period: Mar 15, 2022  Today's Date: 03/15/2022 PT Individual Time: 0800-0900 PT Individual Time Calculation (min): 60 min   Patient has met 2 of 4 short term goals.  Patient with slow, but steady progress this week. He is currently performing bed mobility with mod-max A, transfers (sit to stand and stand pivot with mod A with R upper extremity support, and therapeutic gait 60 feet with mod-max A +2 with 3 musketeers or in Lite Gait. Patient is limited by decreased attention to tasks and to his L hemibody and global deconditioning. Patient does demonstrate hip flexor, quad, and gluteal muscle activation on the L and partial DF x2 during gait training.   Patient continues to demonstrate the following deficits muscle weakness and muscle joint tightness, decreased cardiorespiratoy endurance, abnormal tone, decreased coordination, and decreased motor planning, decreased midline orientation and decreased attention to left, decreased attention, decreased awareness, decreased problem solving, and decreased memory, and decreased sitting balance, decreased standing balance, decreased postural control, hemiplegia, and decreased balance strategies and therefore will continue to benefit from skilled PT intervention to increase functional independence with mobility.  Patient progressing toward long term goals.  Continue plan of care.  PT Short Term Goals Week 1:  PT Short Term Goal 1 (Week 1): Patient will perform bed mobility with mod A in a flat bed with or without use of bed rails. PT Short Term Goal 1 - Progress (Week 1): Progressing toward goal PT Short Term Goal 2 (Week 1): Patient will perform basic transfers with mod A consistently. PT Short Term Goal 2 - Progress (Week 1): Met PT Short Term Goal 3 (Week  1): Patient will ambulate >20 feet using LRAD with max A of 1-2 people. PT Short Term Goal 3 - Progress (Week 1): Met PT Short Term Goal 4 (Week 1): Patient will improve score on Berg Balance Scale by 7 points to meet [name]. PT Short Term Goal 4 - Progress (Week 1): Progressing toward goal Week 2:  PT Short Term Goal 1 (Week 2): Patient will improve score on Berg Balance Scale by 7 points to meet [name]. PT Short Term Goal 2 (Week 2): Patient will ambulate >100 feet using LRAD with mod A of 1-2 people. PT Short Term Goal 3 (Week 2): Patient will perform basic transfers with min A >25% of the time. PT Short Term Goal 4 (Week 2): Paitent will perform bed mobility with mod A with or without use of bed rails.  Skilled Therapeutic Interventions/Progress Updates:     Patient in bed upon PT arrival. Patient alert and agreeable to PT session. Patient denied pain during session, reports improved L shoulder pain today without only intermittent discomfort.   Patient verbose throughout session today, required frequent mod-max cues to redirect patient to therapeutic tasks.   Therapeutic Activity: Bed Mobility: Patient performed rolling R with mod-max A and L with min-CGA using bed rails to doff/don incontinence brief and perform peri-care (patient performed front with set-up and back with total A). Donned scrub pants with total A with patient performing bridging x3 with facilitation through his L knee with clear L hip clearance and gluteal activation. He performed supine to sit with mod A and increased time. Provided verbal cues and facilitation for progressing through R side-lying, sequencing, and positioning of upper extremities. Transfers: Patient  performed stand pivot bed>TIS w/c with mod A and max A for L lower extremity management without an AD and sit to/from stand x1 with min-mod A using R rail. Provided verbal cues for L foot placement (PT blocking throughout transfers), forward weight shift, erect  posture, and sequencing for pivot.  Donned tennis shoes with total A. Educated on importance of proper footwear for therapy sessions and gait training and potential for use of AFO with gait while donning shoes.   Neuromuscular Re-ed: Patient performed therapeutic gait training for improved lower extremity motor control with a familiar and automatic functional task: Patient ambulated 66 feet using R rail with mod-max A ~20 feet and progressing to 3 Musketeer technique with mod-max A +2 and max-total A for L limb advancement. Ambulated with increased trunk flexion, decreased L weight shift, decreased to no L hamstring activation in pre-swing, partial DF activation x2, increased L gluteal and quad activation in stance intermittently, and increased B knee flexion with fatigue. Provided max multimodal cues for erect posture, looking ahead, sequencing, attention to task, and L muscle activation.  Berg Balance Test Sit to Stand: Needs moderate or maximal assist to stand Standing Unsupported: Unable to stand 30 seconds unassisted Sitting with Back Unsupported but Feet Supported on Floor or Stool: Able to sit 2 minutes under supervision Stand to Sit: Needs assistance to sit Transfers: Needs one person to assist Standing Unsupported with Eyes Closed: Needs help to keep from falling Standing Ubsupported with Feet Together: Needs help to attain position and unable to hold for 15 seconds From Standing, Reach Forward with Outstretched Arm: Loses balance while trying/requires external support From Standing Position, Pick up Object from Floor: Unable to try/needs assist to keep balance From Standing Position, Turn to Look Behind Over each Shoulder: Needs assist to keep from losing balance and falling Turn 360 Degrees: Needs assistance while turning Standing Unsupported, Alternately Place Feet on Step/Stool: Needs assistance to keep from falling or unable to try Standing Unsupported, One Foot in Front: Loses  balance while stepping or standing Standing on One Leg: Unable to try or needs assist to prevent fall Total Score: 4/56 (same score on 5/17) Patient demonstrated increased fall risk noted by score of 4/56 on the Berg Balance Scale.  <45/56 = fall risk, <42/56 = predictive of recurrent falls, <40/56 = 100% fall risk  >41 = independent, 21-40 = assistive device, 0-20 = wheelchair level  MDC 6.9 (4 pts 45-56, 5 pts 35-44, 7 pts 25-34) (ANPTA Core Set of Outcome Measures for Adults with Neurologic Conditions, 2018)  Patient in TIS w/c in the room at end of session with breaks locked, seat belt alarm set, and all needs within reach.   Therapy Documentation Precautions:  Precautions Precautions: Fall, Other (comment) Precaution Comments: L hemi Required Braces or Orthoses: Other Brace (Wrist splint and PRAFO) Other Brace: Wrist splint and PRAFO Restrictions Weight Bearing Restrictions: No   Therapy/Group: Individual Therapy  Patrick Buchanan L Patrick Buchanan PT, DPT  03/15/2022, 4:45 PM

## 2022-03-15 NOTE — Progress Notes (Signed)
Pt tolerated Ortho Boot from 1900-0400; pt requested for it to be removed.

## 2022-03-15 NOTE — Progress Notes (Signed)
Patient ID: Patrick Buchanan, male   DOB: April 10, 1951, 71 y.o.   MRN: [id number]  SW left HHA list in room ([email or web address]) for his review and will follow-up about preference.   Loralee Pacas, MSW, [place] Office: [phone] Cell: [phone] Fax: [phone]

## 2022-03-15 NOTE — Progress Notes (Signed)
Speech Language Pathology Weekly Progress and Session Note  Patient Details  Name: Patrick Buchanan MRN: [id number] Date of Birth: 08/01/1951  Beginning of progress report period: Mar 08, 2022 End of progress report period: Mar 15, 2022  Today's Date: 03/15/2022 SLP Individual Time: 1303-1400 SLP Individual Time Calculation (min): 57 min  Short Term Goals: Week 1: SLP Short Term Goal 1 (Week 1): Pt will complete complex problem solving tasks with min A verbal cues. SLP Short Term Goal 1 - Progress (Week 1): Met SLP Short Term Goal 2 (Week 1): Pt will self-monitor and self-correct functional errors with min A verbal cues. SLP Short Term Goal 2 - Progress (Week 1): Met SLP Short Term Goal 3 (Week 1): Pt will demonstrate selective attention in 30 minute intervals during complex tasks with supervision A verbal cues. SLP Short Term Goal 3 - Progress (Week 1): Updated due to goal met SLP Short Term Goal 6 (Week 1): Pt will complete complex problem solving tasks with sup A verbal cues.  New Short Term Goals: Week 2: SLP Short Term Goal 1 (Week 2): Pt will self-monitor and self-correct functional errors with sup A verbal cues. SLP Short Term Goal 2 (Week 2): Pt will self-monitor and self-correct functional errors with sup A verbal cues. SLP Short Term Goal 3 (Week 2): Patient will utilize compensatory memory strategies to recall functional information with sup A verbal and question cues  Weekly Progress Updates: Patient has made functional gains meeting 3 of 3 STGs this reporting period due to improved problem solving skills, error awareness, and selective attention. Goal was added for short-term recall due to inconsistent recall during functional tasks and conversation. Patient is currently completing cognitive tasks with overall sup-to-min A verbal cues  to complete functional and complex tasks accurately and safely. Pt reports feeling below cognitive baseline, although close to prior level of  functioning. Patient and family education is ongoing. Patient would benefit from continued skilled SLP intervention to maximize cognitive functioning and overall functional independence prior to discharge.     Intensity: Minumum of 1-2 x/day, 30 to 90 minutes Frequency: 3 to 5 out of 7 days Duration/Length of Stay: 6/8 Treatment/Interventions: Cognitive remediation/compensation;Cueing hierarchy;Functional tasks;Medication managment;Patient/family education  Daily Session Skilled Therapeutic Interventions: Skilled ST treatment focused on cognitive goals. SLP facilitated session by providing min A fading to sup A verbal cues to load practice medications into BID pillbox during simulated medication management task. Pt exhibited errors x2 with minimal awareness prior to cues to "double check" pillbox in which pt identified and self corrected with 100% accuracy. Pt reports daughter being available to assist with medications at discharge; SLP recommends daughter supervise to ensure accuracy. Pt reported vocal hoarseness which appears to be gradually improving. SLP provided information on vocal hygiene (I.e.g, hydration, vocal breaks, eliminating background noise to reduce vocal strain). SLP assessed S/Z ratio of .88 which is not typically indicative of pathology at the level of the Vfs (S/Z ratio >1.4 may require further ENT evaluation). Educated that if vocal hoarseness persists to inform primary provider following discharge. This does not cause increased concern at this time. Patient was left in bed with alarm activated and immediate needs within reach at end of session. Continue per current plan of care.       General    Pain  None/denied  Therapy/Group: Individual Therapy   T  03/15/2022, 5:18 PM       

## 2022-03-15 NOTE — Progress Notes (Signed)
Lower extremity venous left study completed.   Please see CV Proc for preliminary results.   Breklyn Fabrizio, RDMS, RVT  

## 2022-03-16 MED ORDER — DEXAMETHASONE 2 MG PO TABS
1.0000 mg | ORAL_TABLET | Freq: Two times a day (BID) | ORAL | Status: AC
  Administered 2022-03-16 – 2022-03-19 (×6): 1 mg via ORAL
  Filled 2022-03-16 (×6): qty 1

## 2022-03-16 NOTE — Progress Notes (Signed)
Soap Suds Enema given today after last therapy session, Pt tolerated well, no complaints of pain, however pt states that he "does not feel completely emptied but at least doesn't have the same stomach pain that has been there for days". Explained to patient that it may take a while to experience complete relief from enema and will likely have multiple bowel movements. Call light within reach.

## 2022-03-16 NOTE — Progress Notes (Signed)
Patient refused PM soap suds enema. Would like to have it done tomorrow at 0230 after last therapy. Patient in bed with call bell within reach. Have continued with plan of care.

## 2022-03-16 NOTE — Progress Notes (Signed)
Physical Therapy Session Note  Patient Details  Name: Patrick Buchanan MRN: [id number] Date of Birth: Apr 03, 1951  Today's Date: 03/16/2022 PT Individual Time: 1000-1105 PT Individual Time Calculation (min): 65 min   Short Term Goals: Week 2:  PT Short Term Goal 1 (Week 2): Patient will improve score on Berg Balance Scale by 7 points to meet [name]. PT Short Term Goal 2 (Week 2): Patient will ambulate >100 feet using LRAD with mod A of 1-2 people. PT Short Term Goal 3 (Week 2): Patient will perform basic transfers with min A >25% of the time. PT Short Term Goal 4 (Week 2): Paitent will perform bed mobility with mod A with or without use of bed rails.  Skilled Therapeutic Interventions/Progress Updates:     Therapeutic Activity: Bed Mobility: Patient performed supine to sit with mod A and increased time. Provided verbal cues and facilitation for progressing through R side-lying, sequencing, and positioning of upper extremities. Transfers: Patient performed stand pivot bed>TIS w/c with mod A and max A for L lower extremity management without an AD and sit to/from stand x1 with min-mod A using R rail. Also performed sit to stand with mod-max A +2 without AD. Provided verbal cues for L foot placement (PT blocking throughout transfers), forward weight shift, erect posture, and sequencing for pivot.   Donned tennis shoes and DF assist ACE wrap with total A.    Neuromuscular Re-ed: Patient performed therapeutic gait training for improved lower extremity motor control with a familiar and automatic functional task: Patient ambulated 30 feet using R rail with mod A and 69 feet and 18 feet using 3 Musketeer technique with mod-max A +2 and mod-max A for L limb advancement. Ambulated with increased trunk flexion, decreased L weight shift, decreased to no L hamstring activation in pre-swing, partial DF activation, increased L gluteal and quad activation in stance intermittently, and increased B knee  flexion with fatigue. Provided max multimodal cues for erect posture, looking ahead, sequencing, attention to task, and L muscle activation.  Patient in [place] w/c in the room at end of session with breaks locked and all needs within reach.   Therapy Documentation Precautions:  Precautions Precautions: Fall, Other (comment) Precaution Comments: L hemi Required Braces or Orthoses: Other Brace (Wrist splint and PRAFO) Other Brace: Wrist splint and PRAFO Restrictions Weight Bearing Restrictions: No    Therapy/Group: Individual Therapy  Indigo Barbian L Tiaja Hagan PT, DPT  03/16/2022, 12:53 PM

## 2022-03-16 NOTE — Progress Notes (Signed)
Physical Therapy Session Note  Patient Details  Name: Patrick Buchanan MRN: [id number] Date of Birth: Feb 22, 1951  Today's Date: 03/16/2022 PT Individual Time: 1135-1205 PT Individual Time Calculation (min): 30 min   Short Term Goals: Week 2:  PT Short Term Goal 1 (Week 2): Patient will improve score on Berg Balance Scale by 7 points to meet [name]. PT Short Term Goal 2 (Week 2): Patient will ambulate >100 feet using LRAD with mod A of 1-2 people. PT Short Term Goal 3 (Week 2): Patient will perform basic transfers with min A >25% of the time. PT Short Term Goal 4 (Week 2): Paitent will perform bed mobility with mod A with or without use of bed rails.  Skilled Therapeutic Interventions/Progress Updates:     Pt received seated in tilt in space WC and agrees to therapy. No complaint of pain. Reports needing to urinate and waiting on RN staff. PT encourages pt to attempt to urinate in toilet and pt is agreeable. Sit to stand in [place] with modA and cues for hand placement and body mechanics. Pt noted to flex forwrad and laterally to the L in stedy. PT provides cues to utilize upright gaze to improve posture and balance. Stedy transfer to toilet. Pt is continent of urine. MaxA for stand to [facility] from toilet and heavy facilitation of hip extension while PT assists with pulling up brief and pants. Stedy transfer to bed. Pt requires modA/maxA for sit to supine with cues for body mechanics and sequencing. Pt requires extended time to adjust furniture and setup of bed prior to PT exit. Pt left supine with RN present.  Therapy Documentation Precautions:  Precautions Precautions: Fall, Other (comment) Precaution Comments: L hemi Required Braces or Orthoses: Other Brace (Wrist splint and PRAFO) Other Brace: Wrist splint and PRAFO Restrictions Weight Bearing Restrictions: No  Therapy/Group: Individual Therapy  Breck Coons, PT, DPT 03/16/2022, 4:48 PM

## 2022-03-16 NOTE — Plan of Care (Signed)
  Problem: Consults Goal: RH BRAIN INJURY PATIENT EDUCATION Description: Description: See Patient Education module for eduction specifics Outcome: Progressing Goal: Skin Care Protocol Initiated - if Braden Score 18 or less Description: If consults are not indicated, leave blank or document N/A Outcome: Progressing   Problem: RH BOWEL ELIMINATION Goal: RH STG MANAGE BOWEL WITH ASSISTANCE Description: STG Manage Bowel with [place]. Outcome: Progressing Goal: RH STG MANAGE BOWEL W/MEDICATION W/ASSISTANCE Description: STG Manage Bowel with Medication with [place]. Outcome: Progressing   Problem: RH BLADDER ELIMINATION Goal: RH STG MANAGE BLADDER WITH ASSISTANCE Description: STG Manage Bladder With Min Assistance Outcome: Progressing Goal: RH STG MANAGE BLADDER WITH MEDICATION WITH ASSISTANCE Description: STG Manage Bladder With Medication With [employer]. Outcome: Progressing   Problem: RH SKIN INTEGRITY Goal: RH STG MAINTAIN SKIN INTEGRITY WITH ASSISTANCE Description: STG Maintain Skin Integrity With [place]. Outcome: Progressing Goal: RH STG ABLE TO PERFORM INCISION/WOUND CARE W/ASSISTANCE Description: STG Able To Perform Incision/Wound Care With [employer]. Outcome: Progressing   Problem: RH SAFETY Goal: RH STG ADHERE TO SAFETY PRECAUTIONS W/ASSISTANCE/DEVICE Description: STG Adhere to Safety Precautions With Cues and Reminders. Outcome: Progressing Goal: RH STG DECREASED RISK OF FALL WITH ASSISTANCE Description: STG Decreased Risk of Fall With [employer]. Outcome: Progressing   Problem: RH COGNITION-NURSING Goal: RH STG USES MEMORY AIDS/STRATEGIES W/ASSIST TO PROBLEM SOLVE Description: STG Uses Memory Aids/Strategies With Min Assistance to Problem Solve. Outcome: Progressing Goal: RH STG ANTICIPATES NEEDS/CALLS FOR ASSIST W/ASSIST/CUES Description: STG Anticipates Needs/Calls for Assist With Cues and Reminders. Outcome: Progressing    Problem: RH PAIN MANAGEMENT Goal: RH STG PAIN MANAGED AT OR BELOW PT'S PAIN GOAL Description: < 3 on a 0-10 pain scale. Outcome: Progressing   Problem: RH KNOWLEDGE DEFICIT BRAIN INJURY Goal: RH STG INCREASE KNOWLEDGE OF SELF CARE AFTER BRAIN INJURY Description: Patient will demonstrate knowledge of self-care management, medication/pain management, skin/wound care management with educational materials and handouts provided by staff independently at discharge. Outcome: Progressing

## 2022-03-16 NOTE — Progress Notes (Signed)
PROGRESS NOTE   Subjective/Complaints: SSE was offered late, so pt didn't want it last night. Will take today.   ROS: Patient denies fever, rash, sore throat, blurred vision, dizziness, nausea, vomiting, diarrhea, cough, shortness of breath or chest pain, joint or back/neck pain, headache, or mood change.    Objective:   VAS [place] LOWER EXTREMITY VENOUS (DVT)  Result Date: 03/15/2022  Lower Venous DVT Study Patient Name:  Patrick Buchanan  Date of Exam:   03/15/2022 Medical Rec #: [id number]            Accession #:    [id number] Date of Birth: 03/02/1951            Patient Gender: M Patient Age:   71 years Exam Location:  [facility] Procedure:      VAS [place] LOWER EXTREMITY VENOUS (DVT) Referring Phys: Alger Simons --------------------------------------------------------------------------------  Indications: Follow up DVT.  Anticoagulation: Lovenox. Comparison Study: 03-08-2022 Prior lower extremity venous left was positive for                   DVT involving the posterior tibial veins. Performing Technologist: Darlin Coco RDMS, RVT  Examination Guidelines: A complete evaluation includes B-mode imaging, spectral Doppler, color Doppler, and power Doppler as needed of all accessible portions of each vessel. Bilateral testing is considered an integral part of a complete examination. Limited examinations for reoccurring indications may be performed as noted. The reflux portion of the exam is performed with the patient in reverse Trendelenburg.  +-----+---------------+---------+-----------+----------+--------------+ RIGHTCompressibilityPhasicitySpontaneityPropertiesThrombus Aging +-----+---------------+---------+-----------+----------+--------------+ CFV  Full           Yes      Yes                                 +-----+---------------+---------+-----------+----------+--------------+    +---------+---------------+---------+-----------+----------+-----------------+ LEFT     CompressibilityPhasicitySpontaneityPropertiesThrombus Aging    +---------+---------------+---------+-----------+----------+-----------------+ CFV      Full           Yes      Yes                                    +---------+---------------+---------+-----------+----------+-----------------+ SFJ      Full                                                           +---------+---------------+---------+-----------+----------+-----------------+ FV Prox  Full                                                           +---------+---------------+---------+-----------+----------+-----------------+ FV Mid   Full                                                           +---------+---------------+---------+-----------+----------+-----------------+  FV DistalFull                                                           +---------+---------------+---------+-----------+----------+-----------------+ PFV      Full                                                           +---------+---------------+---------+-----------+----------+-----------------+ POP      Full           Yes      Yes                                    +---------+---------------+---------+-----------+----------+-----------------+ PTV      None           No       No                   Age Indeterminate +---------+---------------+---------+-----------+----------+-----------------+ PERO     Full                                                           +---------+---------------+---------+-----------+----------+-----------------+ Gastroc  Full                                                           +---------+---------------+---------+-----------+----------+-----------------+     Summary: RIGHT: - No evidence of common femoral vein obstruction.  LEFT: - Findings consistent with age indeterminate deep vein  thrombosis involving the left posterior tibial veins.  - Findings appear essentially unchanged compared to previous examination.  - No cystic structure found in the popliteal fossa.  *See table(s) above for measurements and observations. Electronically signed by Harold Barban MD on 03/15/2022 at 8:53:04 PM.    Final    Recent Labs    03/14/22 0530  WBC 11.4*  HGB 11.6*  HCT 35.3*  PLT 204    Recent Labs    03/14/22 0530  NA 136  K 4.2  CL 102  CO2 26  GLUCOSE 162*  BUN 25*  CREATININE 0.84  CALCIUM 8.8*     Intake/Output Summary (Last 24 hours) at 03/16/2022 1003 Last data filed at 03/15/2022 2030 Gross per 24 hour  Intake 118 ml  Output 150 ml  Net -32 ml        Physical Exam: Vital Signs Blood pressure 105/68, pulse (!) 58, temperature 97.8 F (36.6 C), resp. rate 15, height 6' (1.829 m), weight 92.4 kg, SpO2 97 %. Constitutional: No distress . Vital signs reviewed. HEENT: NCAT, EOMI, oral membranes moist Neck: supple Cardiovascular: RRR without murmur. No JVD    Respiratory/Chest: CTA Bilaterally without wheezes or rales. Normal effort    GI/Abdomen: BS +, non-tender,  non-distended Ext: no clubbing, cyanosis, or edema Psych: pleasant and cooperative  Skin: Clean and intact without signs of breakdown, scalp remains incision cdi Neuro:  Alert and oriented x 3. Fair insight and awareness. Intact Memory. Normal language and speech. Cranial nerve exam unremarkable. LUE 0-tr shoulder, bicep, tricep, 3/5 wrist and 3/5 finger flexors. RUE 4-5/5. LLE with 1/5 HE, KE and trace at foot. RLE 4/5. Minimal extensor tone LLE and minimal  flexor tone in LUE. Seems to sense pain and LT in all four. DTR's 3+ LLE, 2+ LUE. -stable exam Musculoskeletal: left heel cord looser, more easily ranged to 90+.. No joint pain with ROM.    Assessment/Plan: 1. Functional deficits which require 3+ hours per day of interdisciplinary therapy in a comprehensive inpatient rehab setting. Physiatrist  is providing close team supervision and 24 hour management of active medical problems listed below. Physiatrist and rehab team continue to assess barriers to discharge/monitor patient progress toward functional and medical goals  Care Tool:  Bathing    Body parts bathed by patient: Left arm, Chest, Abdomen, Front perineal area   Body parts bathed by helper: Right arm, Buttocks, Right upper leg, Left upper leg, Right lower leg, Left lower leg     Bathing assist Assist Level: Maximal Assistance - Patient 24 - 49%     Upper Body Dressing/Undressing Upper body dressing   What is the patient wearing?: Pull over shirt    Upper body assist Assist Level: Maximal Assistance - Patient 25 - 49%    Lower Body Dressing/Undressing Lower body dressing      What is the patient wearing?: Pants     Lower body assist Assist for lower body dressing: Total Assistance - Patient < 25%     Toileting Toileting    Toileting assist Assist for toileting: Dependent - Patient 0% Assistive Device Comment: urinal   Transfers Chair/bed transfer  Transfers assist     Chair/bed transfer assist level: Moderate Assistance - Patient 50 - 74%     Locomotion Ambulation   Ambulation assist      Assist level: 2 helpers Assistive device: Other (comment) (3 Musketeer) Max distance: 60 ft   Walk 10 feet activity   Assist  Walk 10 feet activity did not occur: Safety/medical concerns  Assist level: Moderate Assistance - Patient - 50 - 74% Assistive device: Other (comment) (R rail)   Walk 50 feet activity   Assist Walk 50 feet with 2 turns activity did not occur: Safety/medical concerns  Assist level: 2 helpers Assistive device: Other (comment) (3 Musketeer)    Walk 150 feet activity   Assist Walk 150 feet activity did not occur: Safety/medical concerns         Walk 10 feet on uneven surface  activity   Assist Walk 10 feet on uneven surfaces activity did not occur:  Safety/medical concerns         Wheelchair     Assist Is the patient using a wheelchair?: Yes Type of Wheelchair: Manual    Wheelchair assist level: Dependent - Patient 0%      Wheelchair 50 feet with 2 turns activity    Assist        Assist Level: Dependent - Patient 0%   Wheelchair 150 feet activity     Assist      Assist Level: Dependent - Patient 0%   Blood pressure 105/68, pulse (!) 58, temperature 97.8 F (36.6 C), resp. rate 15, height 6' (1.829 m), weight 92.4 kg, SpO2  97 %.  Medical Problem List and Plan: 1. 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             -patient may shower               -ELOS/Goals: 03/30/22            -Continue CIR therapies including PT, OT, and SLP   2.  Antithrombotics: -DVT/anticoagulation:  pt with left posterior tib dvt.   -began lovenox '40mg'$  q12  -re-doppler 5/24 demonstrates persistent left posterior tib dvt  -re-image prior to discharge to determine a/c need  -activity as tolerated             -antiplatelet therapy: none 3. Pain Management: Tylenol, hydrocodone, robaxin as needed             -Continue Gabapentin '100mg'$  BID 4. Mood: team providing emotional support             -antipsychotic agents: n/a             --Depression continue Lexapro 20 mg daily 5. Neuropsych: This patient is capable of making decisions on his own behalf. 6. Skin/Wound Care: Routine skin care checks             -- Monitor surgical incision 7. Fluids/Electrolytes/Nutrition:    - continue to push fluids  -protein supp for low albumin 9: History of multiple meningiomas/s/p resection:             -- gradually tapering steroids off. Discussed effect on CBG's   -have discussed with pt             -- Continue Keppra and Lamictal             -- Calcium carbonate tablets 3 times daily 10: Dyslipidemia: Continue Crestor 20 mg daily 11: History of GERD: Continue Protonix 40  mg daily 12: DM-2: diet controlled (A1c = 5.7 on 02/22/2022)   13: Anemia/acute intra-op blood loss: follow-up CBC 14. Constipation: some results 5/17, none since-  -5/24 SSE today  - continue  magnesium gluconate '250mg'$  HS  - senna-s bid, miralax bid as mtc 15. Spasticity:   night splint for LLE, tolerating  -continue trial of baclofen '5mg'$  tid for tone-- increased to '10mg'$  tid  -splinting, rom with therapies   -improved 16. Hypertension:  improved, continue magnesium gluconate '250mg'$  HS    LOS: 9 days A FACE TO FACE EVALUATION WAS PERFORMED  Meredith Staggers 03/16/2022, 10:03 AM

## 2022-03-16 NOTE — Progress Notes (Signed)
Speech Language Pathology Daily Session Note  Patient Details  Name: CLARY MEEKER MRN: [id number] Date of Birth: 21-Jun-1951  Today's Date: 03/16/2022 SLP Individual Time: 1303-1400 SLP Individual Time Calculation (min): 57 min  Short Term Goals: Week 2: SLP Short Term Goal 1 (Week 2): Pt will self-monitor and self-correct functional errors with sup A verbal cues. SLP Short Term Goal 2 (Week 2): Pt will self-monitor and self-correct functional errors with sup A verbal cues. SLP Short Term Goal 3 (Week 2): Patient will utilize compensatory memory strategies to recall functional information with sup A verbal and question cues  Skilled Therapeutic Interventions: Skilled ST treatment focused on cognitive goals. SLP facilitated session by providing sup A verbal cues for error awareness, problem solving, and mental flexibility with complex deductive reasoning puzzle. SLP also facilitated sequential  memory and verbal reasoning task with overall mod I for recall. Patient was left in bed with alarm activated and immediate needs within reach at end of session. Continue per current plan of care.      Pain  None/denied  Therapy/Group: Individual Therapy  Patty Sermons 03/16/2022, 4:14 PM

## 2022-03-16 NOTE — Progress Notes (Signed)
Occupational Therapy Weekly Progress Note  Patient Details  Name: Patrick Buchanan MRN: [id number] Date of Birth: 1950/11/03  Beginning of progress report period: Mar 08, 2022 End of progress report period: Mar 16, 2022  700-815 75 min  Patient has met 2 of 4 short term goals.  Pt is making steady progress towards OT goals, however continues to require MOD-MAX A funcitonally with BADLs and MOD A for functional trasnfers d/t dense hemiplegia. Pt continues to demo poor midline orientation and L hemiplegia impacting performance of BADLs. Pt also demos some learned helplessness and poor frustration tolerance impacting use of adaptive strategies and requires increased encouragement to trial different approaches.  Patient continues to demonstrate the following deficits: muscle weakness, decreased cardiorespiratoy endurance, impaired timing and sequencing, abnormal tone, unbalanced muscle activation, decreased coordination, and decreased motor planning, decreased attention to left, decreased attention, decreased awareness, decreased problem solving, decreased safety awareness, and decreased memory, and decreased sitting balance, decreased standing balance, decreased postural control, hemiplegia, and decreased balance strategies and therefore will continue to benefit from skilled OT intervention to enhance overall performance with BADL and Reduce care partner burden.  Patient progressing toward long term goals..  Continue plan of care.  OT Short Term Goals Week 1:  OT Short Term Goal 1 (Week 1): Pt will sit dynamically at EOB or EOM for 10 min with no more than CGA for balance corrections OT Short Term Goal 1 - Progress (Week 1): Progressing toward goal OT Short Term Goal 2 (Week 1): Pt will complete toilet transfer with MAX A of 1 and LRAD OT Short Term Goal 2 - Progress (Week 1): Met OT Short Term Goal 3 (Week 1): Pt will sit to stand with MOD A Overall in prep for LB and toileting tasks OT Short  Term Goal 3 - Progress (Week 1): Met OT Short Term Goal 4 (Week 1): Pt will recall hemi strategies with MIN cuing OT Short Term Goal 4 - Progress (Week 1): Progressing toward goal Week 2:  OT Short Term Goal 1 (Week 2): Pt will maintain dynamic sitting balance for 10 min EOB/EOM to improve midline orientation OT Short Term Goal 2 (Week 2): Pt will recall hemi strategies wiht min cuing for UB dressing OT Short Term Goal 3 (Week 2): Pt will complete toilet transfers wiht MOD A consistently OT Short Term Goal 4 (Week 2): pt will use LUE in grooming tasks with MOD A to demo improved L attetnion/funcitonal use  Skilled Therapeutic Interventions/Progress Updates:    Pt received in bed agreeable to OT with no pain reported. Pt reporting needing to use bathroom. MAX A stand pivots to L with L knee block and facilitation of trunk anterior weight shift provided and MOD A to R with same facilitation from EOB>TIS<>BSC. Pt with no void on BSC. Moved to bathing at sink with [facility] failication of LUE for bathing tasks. Pt able ot recall hemi dressing strategies verbally but requires MOD VC for sustained attention to task d/t verbosity. Pt requires MOD A for UB dressing and MAX A for LB dressing sit to stand. Bathing overall MOD A for UB and MAX A for LB d/t poor dynamic trunk control in TIS requiring max VC for midline orientation. Grooming with VC for 1 handed technique after donning giv mor sling. Exited session with pt seated in TIS, exit alarm on and call light in reach   Therapy Documentation Precautions:  Precautions Precautions: Fall, Other (comment) Precaution Comments: L hemi Required Braces or  Orthoses: Other Brace (Wrist splint and PRAFO) Other Brace: Wrist splint and PRAFO Restrictions Weight Bearing Restrictions: No General:    Therapy/Group: Individual Therapy  [name] 03/16/2022, 6:45 AM

## 2022-03-17 MED ORDER — SORBITOL 70 % SOLN
60.0000 mL | Status: AC
  Administered 2022-03-17: 60 mL via ORAL

## 2022-03-17 MED ORDER — SORBITOL 70 % SOLN
960.0000 mL | TOPICAL_OIL | Freq: Once | ORAL | Status: AC
  Administered 2022-03-17: 960 mL via RECTAL
  Filled 2022-03-17: qty 473

## 2022-03-17 MED ORDER — DEXAMETHASONE 2 MG PO TABS
1.0000 mg | ORAL_TABLET | Freq: Every day | ORAL | Status: AC
  Administered 2022-03-21 – 2022-03-23 (×3): 1 mg via ORAL
  Filled 2022-03-17 (×3): qty 1

## 2022-03-17 NOTE — Progress Notes (Signed)
Physical Therapy Session Note  Patient Details  Name: Patrick Buchanan MRN: [id number] Date of Birth: 20-Aug-1951  Today's Date: 03/17/2022 PT Individual Time: 1350-1430 PT Individual Time Calculation (min): 40 min   Short Term Goals: Week 2:  PT Short Term Goal 1 (Week 2): Patient will improve score on Berg Balance Scale by 7 points to meet [name]. PT Short Term Goal 2 (Week 2): Patient will ambulate >100 feet using LRAD with mod A of 1-2 people. PT Short Term Goal 3 (Week 2): Patient will perform basic transfers with min A >25% of the time. PT Short Term Goal 4 (Week 2): Paitent will perform bed mobility with mod A with or without use of bed rails.  Skilled Therapeutic Interventions/Progress Updates:     Pt received seated in [facility] and agrees to therapy. No complaint of pain. Daughter present for family education. PT provides education about pt's deficits and current progress with therapy. Education includes recommendation for 24/7 supervision/assistance following discharge, pt's L sided hemiplegia, trunk control impairments, and L sided inattention. PT discusses need for custom WC and possible need for power mobility, as well pros vs cons of hospital bed.   WC transport to gym. Pt performs sit to stand with modA +2. Pt then ambulates x30' with 3 musketeers technique and maxA +2, with consistent multimodal cues for upright posture, and max facilitation of L lower extremity progression as well as blocking of L knee during stance phase.   Squat pivot back to bed with maxA and cues for hand placement and sequencing. Sit to supine with maxA +1. Pt left supine with RN present and all needs within reach.  Therapy Documentation Precautions:  Precautions Precautions: Fall, Other (comment) Precaution Comments: L hemi Required Braces or Orthoses: Other Brace (Wrist splint and PRAFO) Other Brace: Wrist splint and PRAFO Restrictions Weight Bearing Restrictions: No   Therapy/Group: Individual  Therapy  Breck Coons, PT, DPT 03/17/2022, 8:23 PM

## 2022-03-17 NOTE — Progress Notes (Signed)
Physical Therapy Session Note  Patient Details  Name: Patrick Buchanan MRN: [id number] Date of Birth: 16-Aug-1951  Today's Date: 03/17/2022 PT Individual Time: 2119-4174 PT Individual Time Calculation (min): 41 min   Short Term Goals: Week 2:  PT Short Term Goals - Week 2 PT Short Term Goal 1 (Week 2): Patient will improve score on Berg Balance Scale by 7 points to meet [name]. PT Short Term Goal 2 (Week 2): Patient will ambulate >100 feet using LRAD with mod A of 1-2 people. PT Short Term Goal 3 (Week 2): Patient will perform basic transfers with min A >25% of the time. PT Short Term Goal 4 (Week 2): Paitent will perform bed mobility with mod A with or without use of bed rails.  Skilled Therapeutic Interventions/Progress Updates:  Patient seated upright in TIS w/c on entrance to room. Patient alert and agreeable to PT session.   Patient with no pain complaint throughout session.  Therapeutic Activity: Pt adjusted to upright seated position in w/c and pillows removed for therapy. Pt assisted in reach for pt requested items on tray table, I.e. glasses. MinA/ CGA for reach throughout d/t decreased trunk control. Pt then asks to be assisted to bed as he has been sitting up for a "long time".  Transfers: Patient performed squat pivot transfer w/c to bed to R side using bedrail with modA. Provided verbal cues for positioning and technique. Bed Mobility: Patient performed sit-->supine with overall ModA for BLE. Pt is able to plan and perform lowering to elbow, then requires verbal instructions to complete to lowering to side.   Pt is able to position self partially in bed, then requires MaxA for full neutral positioning with pillow placement for full comfort. Requires extra time to complete.   Patient supine  in bed at end of session with brakes locked, bed alarm set, and all needs within reach in bed and on tray table.    Therapy Documentation Precautions:  Precautions Precautions: Fall,  Other (comment) Precaution Comments: L hemi Required Braces or Orthoses: Other Brace (Wrist splint and PRAFO) Other Brace: Wrist splint and PRAFO Restrictions Weight Bearing Restrictions: No General:   Vital Signs:  Pain:  No pain complaint other than discomfort from long time sitting in w/c requiring repositioning.   Therapy/Group: Individual Therapy  Alger Simons PT, DPT 03/17/2022, 12:25 PM

## 2022-03-17 NOTE — Progress Notes (Signed)
Speech Language Pathology Daily Session Note  Patient Details  Name: CRAIGE PATEL MRN: [id number] Date of Birth: Dec 03, 1950  Today's Date: 03/17/2022 SLP Individual Time: 1430-1500 SLP Individual Time Calculation (min): 30 min  Short Term Goals: Week 2: SLP Short Term Goal 1 (Week 2): Pt will self-monitor and self-correct functional errors with sup A verbal cues. SLP Short Term Goal 2 (Week 2): Pt will self-monitor and self-correct functional errors with sup A verbal cues. SLP Short Term Goal 3 (Week 2): Patient will utilize compensatory memory strategies to recall functional information with sup A verbal and question cues  Skilled Therapeutic Interventions: Skilled ST treatment focused on family education with pt's daughter. SLP educated re: cognitive-linguistic deficits impacting higher level problem solving, executive functions, and recall. Pt/daughter reported decreased cognitive "clarity" at prior level, which resulted in visits to further assess ultimately leading to menigioma dx and surgery. Daughter feels pt is close to cognitive baseline and has witnessed significant improvement since initial dx and admission to hospital. SLP discussed recommendations for supervision with iADLs including medication management at discharge. Daughter confirmed she will be able to provide support and plans to order push top pillbox on [employer]. Pt with questions on how to add Dr. Naaman Plummer as provider on his MyChart app. Pt opened MyChart on phone independently. Despite pt, daughter, and SLP's attempts, unsuccessful with determining how to add a new providers. It appears this is something that needs to be done internally. SLP provided with MyChart IT number. Daughter plans to call. All questions pertaining to skilled ST intervention were addressed. Patient was left in bed with alarm activated and immediate needs within reach at end of session. Continue per current plan of care.       Pain    Therapy/Group:  Individual Therapy  Patty Sermons 03/17/2022, 4:14 PM

## 2022-03-17 NOTE — Progress Notes (Signed)
Occupational Therapy Session Note  Patient Details  Name: Patrick Buchanan MRN: [id number] Date of Birth: Feb 16, 1951  Today's Date: 03/17/2022 OT Individual Time: 1300-1350 OT Individual Time Calculation (min): 50 min    Short Term Goals: Week 1:  OT Short Term Goal 1 (Week 1): Pt will sit dynamically at EOB or EOM for 10 min with no more than CGA for balance corrections OT Short Term Goal 1 - Progress (Week 1): Progressing toward goal OT Short Term Goal 2 (Week 1): Pt will complete toilet transfer with MAX A of 1 and LRAD OT Short Term Goal 2 - Progress (Week 1): Met OT Short Term Goal 3 (Week 1): Pt will sit to stand with MOD A Overall in prep for LB and toileting tasks OT Short Term Goal 3 - Progress (Week 1): Met OT Short Term Goal 4 (Week 1): Pt will recall hemi strategies with MIN cuing OT Short Term Goal 4 - Progress (Week 1): Progressing toward goal Week 2:  OT Short Term Goal 1 (Week 2): Pt will maintain dynamic sitting balance for 10 min EOB/EOM to improve midline orientation OT Short Term Goal 2 (Week 2): Pt will recall hemi strategies wiht min cuing for UB dressing OT Short Term Goal 3 (Week 2): Pt will complete toilet transfers wiht MOD A consistently OT Short Term Goal 4 (Week 2): pt will use LUE in grooming tasks with MOD A to demo improved L attetnion/funcitonal use  Skilled Therapeutic Interventions/Progress Updates:    1:1 Pt received in bed. Total A to thread pants and rolling to assist with pulling up pants. PT rolled to the left with min A and max A to roll to the right. In sidelying AAROM with max A in gravity assisted plan to work to elicit scapular protraction/retraction.  Pt's daughter, [name], arrived. Educated on current status, OT goals, recommendations for f/u; home setup including bathroom options. She did report they are building a ramp. Pt came to EOB with mod A with extra time and facilitation to be able to get right UE underneath him to help him come up  into sitting. Continued educating while working in sitting position. Pt reported min A to maintain sitting balance with max cues for attention to balance. Engagaed in donning both shoes with extra time and cues for sequencing. Required A to help with the back portion of shoe with right foot. Figure 4 position to help don left shoe with extra time.   Before shoe donned - worked on ability to tie shoe with left hand being assist.- able to successfully do it.   Transfer training with Ot demonstrating to daughter Transfers bed<w/c<bed<w/c with mod A squat pivots with cues for sequencing and facilitations. Hand off to next therapist for Pt fam edu.    Therapy Documentation Precautions:  Precautions Precautions: Fall, Other (comment) Precaution Comments: L hemi Required Braces or Orthoses: Other Brace (Wrist splint and PRAFO) Other Brace: Wrist splint and PRAFO Restrictions Weight Bearing Restrictions: No  Pain:  No c/o pain in session   Therapy/Group: Individual Therapy  Willeen Cass [facility] 03/17/2022, 2:48 PM

## 2022-03-17 NOTE — Progress Notes (Signed)
Occupational Therapy Session Note  Patient Details  Name: RJAY REVOLORIO MRN: [id number] Date of Birth: 1951-01-05  Today's Date: 03/17/2022 OT Individual Time: 0930-1020 OT Individual Time Calculation (min): 50 min    Short Term Goals: Week 1:  OT Short Term Goal 1 (Week 1): Pt will sit dynamically at EOB or EOM for 10 min with no more than CGA for balance corrections OT Short Term Goal 1 - Progress (Week 1): Progressing toward goal OT Short Term Goal 2 (Week 1): Pt will complete toilet transfer with MAX A of 1 and LRAD OT Short Term Goal 2 - Progress (Week 1): Met OT Short Term Goal 3 (Week 1): Pt will sit to stand with MOD A Overall in prep for LB and toileting tasks OT Short Term Goal 3 - Progress (Week 1): Met OT Short Term Goal 4 (Week 1): Pt will recall hemi strategies with MIN cuing OT Short Term Goal 4 - Progress (Week 1): Progressing toward goal  Skilled Therapeutic Interventions/Progress Updates:     Pt received in bed with no pain reported, agreeable to shower but admittedly nervous.  ADL: Pt completes ADL at overall MAX A Level. Skilled interventions include: placing pt on far R of TTB with stedy to improve contact with R shower wall for midline orientation for tactile/visual cue, HOH A to incorporate LUE into ADL, encouragement for LUE self management during bed mobility and in wc, and hemi dressing strategies. Pt overall with multiple frequent LOB when talking or trying to wash with RUE requiring min-mod A to maintain balance. Pt able to ID when losing balance but demo delayed and insufficient postural adjustments to correct without A. Pt demo poor insight into attention deficits despite OT providing education on staying quiet when trying to do a task as this adds to pts poor control/body awareness. Pt remains motivated but continues to need encouragement to participate to maximum potential/try adaptive techniques.   Pt left at end of session in bed with exit alarm on,  call light in reach and all needs met   Therapy Documentation Precautions:  Precautions Precautions: Fall, Other (comment) Precaution Comments: L hemi Required Braces or Orthoses: Other Brace (Wrist splint and PRAFO) Other Brace: Wrist splint and PRAFO Restrictions Weight Bearing Restrictions: No    Therapy/Group: Individual Therapy  [name] 03/17/2022, 6:59 AM

## 2022-03-17 NOTE — Progress Notes (Signed)
PROGRESS NOTE   Subjective/Complaints: Had some results with SSE yesterday. Medium liquid stool reported. Stomach feels better. But he still feels full. Felt some spasms in his left foot early this morning  ROS: Patient denies fever, rash, sore throat, blurred vision, dizziness, nausea, vomiting, diarrhea, cough, shortness of breath or chest pain, joint or back/neck pain, headache, or mood change.    Objective:   VAS [place] LOWER EXTREMITY VENOUS (DVT)  Result Date: 03/15/2022  Lower Venous DVT Study Patient Name:  Patrick Buchanan  Date of Exam:   03/15/2022 Medical Rec #: [id number]            Accession #:    [id number] Date of Birth: Sep 16, 1951            Patient Gender: M Patient Age:   71 years Exam Location:  [facility] Procedure:      VAS [place] LOWER EXTREMITY VENOUS (DVT) Referring Phys: Alger Simons --------------------------------------------------------------------------------  Indications: Follow up DVT.  Anticoagulation: Lovenox. Comparison Study: 03-08-2022 Prior lower extremity venous left was positive for                   DVT involving the posterior tibial veins. Performing Technologist: Darlin Coco RDMS, RVT  Examination Guidelines: A complete evaluation includes B-mode imaging, spectral Doppler, color Doppler, and power Doppler as needed of all accessible portions of each vessel. Bilateral testing is considered an integral part of a complete examination. Limited examinations for reoccurring indications may be performed as noted. The reflux portion of the exam is performed with the patient in reverse Trendelenburg.  +-----+---------------+---------+-----------+----------+--------------+ RIGHTCompressibilityPhasicitySpontaneityPropertiesThrombus Aging +-----+---------------+---------+-----------+----------+--------------+ CFV  Full           Yes      Yes                                  +-----+---------------+---------+-----------+----------+--------------+   +---------+---------------+---------+-----------+----------+-----------------+ LEFT     CompressibilityPhasicitySpontaneityPropertiesThrombus Aging    +---------+---------------+---------+-----------+----------+-----------------+ CFV      Full           Yes      Yes                                    +---------+---------------+---------+-----------+----------+-----------------+ SFJ      Full                                                           +---------+---------------+---------+-----------+----------+-----------------+ FV Prox  Full                                                           +---------+---------------+---------+-----------+----------+-----------------+ FV Mid   Full                                                           +---------+---------------+---------+-----------+----------+-----------------+  FV DistalFull                                                           +---------+---------------+---------+-----------+----------+-----------------+ PFV      Full                                                           +---------+---------------+---------+-----------+----------+-----------------+ POP      Full           Yes      Yes                                    +---------+---------------+---------+-----------+----------+-----------------+ PTV      None           No       No                   Age Indeterminate +---------+---------------+---------+-----------+----------+-----------------+ PERO     Full                                                           +---------+---------------+---------+-----------+----------+-----------------+ Gastroc  Full                                                           +---------+---------------+---------+-----------+----------+-----------------+     Summary: RIGHT: - No evidence of common femoral vein  obstruction.  LEFT: - Findings consistent with age indeterminate deep vein thrombosis involving the left posterior tibial veins.  - Findings appear essentially unchanged compared to previous examination.  - No cystic structure found in the popliteal fossa.  *See table(s) above for measurements and observations. Electronically signed by Harold Barban MD on 03/15/2022 at 8:53:04 PM.    Final    No results for input(s): WBC, HGB, HCT, PLT in the last 72 hours.   No results for input(s): NA, K, CL, CO2, GLUCOSE, BUN, CREATININE, CALCIUM in the last 72 hours.    Intake/Output Summary (Last 24 hours) at 03/17/2022 1044 Last data filed at 03/17/2022 9798 Gross per 24 hour  Intake 477 ml  Output 200 ml  Net 277 ml        Physical Exam: Vital Signs Blood pressure 110/70, pulse (!) 55, temperature 97.8 F (36.6 C), resp. rate 17, height 6' (1.829 m), weight 92.4 kg, SpO2 100 %. Constitutional: No distress . Vital signs reviewed. HEENT: NCAT, EOMI, oral membranes moist Neck: supple Cardiovascular: RRR without murmur. No JVD    Respiratory/Chest: CTA Bilaterally without wheezes or rales. Normal effort    GI/Abdomen: BS +, non-tender, non-distended, soft Ext: no clubbing, cyanosis, or edema Psych: pleasant and cooperative  Skin: Clean and intact without signs of breakdown, scalp  remains incision cdi Neuro:  Alert and oriented x 3. Fair insight and awareness. Intact Memory. Normal language and speech. Cranial nerve exam unremarkable. LUE 0-tr shoulder, bicep, tricep, 3/5 wrist and 3/5 finger flexors. RUE 4-5/5. LLE with 1 to 1+ /5 HE, KE and trace at foot. RLE 4/5. Minimal extensor tone LLE and minimal  flexor tone in LUE. Seems to sense pain and LT in all four. DTR's 3+ LLE, 2+ LUE. -stable exam Musculoskeletal: heel cord easily stretched to 90 degrees. I placed him back in his night splint   Assessment/Plan: 1. Functional deficits which require 3+ hours per day of interdisciplinary therapy in a  comprehensive inpatient rehab setting. Physiatrist is providing close team supervision and 24 hour management of active medical problems listed below. Physiatrist and rehab team continue to assess barriers to discharge/monitor patient progress toward functional and medical goals  Care Tool:  Bathing    Body parts bathed by patient: Left arm, Chest, Abdomen, Front perineal area, Right upper leg, Left upper leg, Face   Body parts bathed by helper: Right arm, Buttocks, Right lower leg, Left lower leg     Bathing assist Assist Level: Maximal Assistance - Patient 24 - 49%     Upper Body Dressing/Undressing Upper body dressing   What is the patient wearing?: Pull over shirt    Upper body assist Assist Level: Moderate Assistance - Patient 50 - 74%    Lower Body Dressing/Undressing Lower body dressing      What is the patient wearing?: Pants     Lower body assist Assist for lower body dressing: Total Assistance - Patient < 25%     Toileting Toileting    Toileting assist Assist for toileting: Total Assistance - Patient < 25% Assistive Device Comment: urinal   Transfers Chair/bed transfer  Transfers assist     Chair/bed transfer assist level: Moderate Assistance - Patient 50 - 74%     Locomotion Ambulation   Ambulation assist      Assist level: 2 helpers Assistive device: Other (comment) (3 Musketeer) Max distance: 60 ft   Walk 10 feet activity   Assist  Walk 10 feet activity did not occur: Safety/medical concerns  Assist level: Moderate Assistance - Patient - 50 - 74% Assistive device: Other (comment) (R rail)   Walk 50 feet activity   Assist Walk 50 feet with 2 turns activity did not occur: Safety/medical concerns  Assist level: 2 helpers Assistive device: Other (comment) (3 Musketeer)    Walk 150 feet activity   Assist Walk 150 feet activity did not occur: Safety/medical concerns         Walk 10 feet on uneven surface   activity   Assist Walk 10 feet on uneven surfaces activity did not occur: Safety/medical concerns         Wheelchair     Assist Is the patient using a wheelchair?: Yes Type of Wheelchair: Manual    Wheelchair assist level: Dependent - Patient 0%      Wheelchair 50 feet with 2 turns activity    Assist        Assist Level: Dependent - Patient 0%   Wheelchair 150 feet activity     Assist      Assist Level: Dependent - Patient 0%   Blood pressure 110/70, pulse (!) 55, temperature 97.8 F (36.6 C), resp. rate 17, height 6' (1.829 m), weight 92.4 kg, SpO2 100 %.  Medical Problem List and Plan: 1. Functional deficits secondary to large parasagittal  meningioma status post resection with left hemiparesis.             -patient may shower               -ELOS/Goals: 03/30/22          -Continue CIR therapies including PT, OT, and SLP    2.  Antithrombotics: -DVT/anticoagulation:  pt with left posterior tib dvt.   -began lovenox '40mg'$  q12  -re-doppler 5/24 demonstrated persistent left posterior tib dvt  -re-image prior to discharge to determine a/c need  -activity as tolerated             -antiplatelet therapy: none 3. Pain Management: Tylenol, hydrocodone, robaxin as needed             -Continue Gabapentin '100mg'$  BID 4. Mood: team providing emotional support             -antipsychotic agents: n/a             --Depression continue Lexapro 20 mg daily 5. Neuropsych: This patient is capable of making decisions on his own behalf. 6. Skin/Wound Care: Routine skin care checks             -- Monitor surgical incision 7. Fluids/Electrolytes/Nutrition:    - continue to push fluids  -protein supp for low albumin 9: History of multiple meningiomas/s/p resection:             -- gradually tapering steroids off. Discussed effect on CBG's   -have discussed with pt             -- Continue Keppra and Lamictal             -- Calcium carbonate tablets 3 times daily 10:  Dyslipidemia: Continue Crestor 20 mg daily 11: History of GERD: Continue Protonix 40 mg daily 12: DM-2: diet controlled (A1c = 5.7 on 02/22/2022)   13: Anemia/acute intra-op blood loss: follow-up CBC 14. Constipation: some results 5/17, none since-  -5/26 SSE yesterday with medium/liquidy results   -Sorbitol + SMOG enema today   - continue  magnesium gluconate '250mg'$  HS  - senna-s bid, miralax bid as mtc regimen 15. Spasticity:   night splint for LLE, tolerating  -continue trial of baclofen '5mg'$  tid for tone-- increased to '10mg'$  tid  -splinting, rom with therapies   -improved 16. Hypertension:  controlled 5/26 -continue magnesium gluconate '250mg'$  HS    LOS: 10 days A FACE TO FACE EVALUATION WAS PERFORMED  Meredith Staggers 03/17/2022, 10:44 AM

## 2022-03-17 NOTE — Progress Notes (Signed)
Pt given 41m sorbitol before last therapy session. SMOG enema given at end of last therapy session, pt complains of pain in Left leg and requested removal of brace. Pt states pain is a sharp pain on lower r chin area and states 8/10 pain. Pt tolerated enema well, pt placed on bedpan and advised to hold enema as long as he can tolerate. Pt requested to be disimpaction, nursing educated patient on enema vs. Disimpaction, nurse did not feel comfortable disimpacting immediately after giving enema, awaiting results.

## 2022-03-18 MED ORDER — MAGNESIUM HYDROXIDE 400 MG/5ML PO SUSP: 30.0000 mL | Freq: Every evening | ORAL | Status: DC | PRN

## 2022-03-18 NOTE — Progress Notes (Signed)
PROGRESS NOTE   Subjective/Complaints:  Patient has some results with SSE on Thursday, with medium liquid stool.  Reports that his stomach feel better but would prefer to have milk of magnesia available since this is what he takes at home.  ROS: Patient denies fever, rash, sore throat, blurred vision, dizziness, nausea, vomiting, diarrhea, cough, shortness of breath or chest pain, denies joint, back or neck pain, headache, or mood change.  Objective:   No results found. No results for input(s): WBC, HGB, HCT, PLT in the last 72 hours.   No results for input(s): NA, K, CL, CO2, GLUCOSE, BUN, CREATININE, CALCIUM in the last 72 hours.    Intake/Output Summary (Last 24 hours) at 03/18/2022 1441 Last data filed at 03/18/2022 0302 Gross per 24 hour  Intake 220 ml  Output 225 ml  Net -5 ml        Physical Exam: Vital Signs Blood pressure 104/64, pulse 75, temperature 98.6 F (37 C), temperature source Oral, resp. rate 16, height 6' (1.829 m), weight 92.4 kg, SpO2 97 %. Constitutional: No distress . Vital signs reviewed. HEENT: NCAT, EOMI, oral membranes moist Neck: supple Cardiovascular: RRR without murmur. No JVD    Respiratory/Chest: CTA Bilaterally without wheezes or rales. Normal effort    GI/Abdomen: BS +, soft, non-tender, non-distended Ext: no clubbing, cyanosis, or edema Psych: pleasant and cooperative  Skin: Clean and intact without signs of breakdown, scalp remains incision cdi Neuro:  Alert and oriented x 3. Fair insight and awareness. Intact Memory. Normal language and speech. CN II-XII grossly intact.  LUE 0-tr shoulder, bicep, tricep, 3/5 wrist and 3/5 finger flexors. RUE 4-5/5. LLE with 1 to 1+ /5 HE, KE and trace at foot. RLE 4/5. Minimal extensor tone LLE and  minimal  flexor tone in LUE. Sensory: intact to LT and pain in all 4 extremities. DTR's 3+ LLE, 2+ LUE. Musculoskeletal: Continues to use night splint,  heel cord can be stretched easily to 90 degrees.  Assessment/Plan: 1. Functional deficits which require 3+ hours per day of interdisciplinary therapy in a comprehensive inpatient rehab setting. Physiatrist is providing close team supervision and 24 hour management of active medical problems listed below. Physiatrist and rehab team continue to assess barriers to discharge/monitor patient progress toward functional and medical goals  Care Tool:  Bathing    Body parts bathed by patient: Left arm, Chest, Abdomen, Front perineal area, Right upper leg, Left upper leg, Face   Body parts bathed by helper: Right arm, Buttocks, Right lower leg, Left lower leg     Bathing assist Assist Level: Maximal Assistance - Patient 24 - 49%     Upper Body Dressing/Undressing Upper body dressing   What is the patient wearing?: Pull over shirt    Upper body assist Assist Level: Moderate Assistance - Patient 50 - 74%    Lower Body Dressing/Undressing Lower body dressing      What is the patient wearing?: Pants     Lower body assist Assist for lower body dressing: Total Assistance - Patient < 25%     Toileting Toileting    Toileting assist Assist for toileting: Total Assistance - Patient < 25% Assistive Device  Comment: urinal   Transfers Chair/bed transfer  Transfers assist     Chair/bed transfer assist level: Moderate Assistance - Patient 50 - 74%     Locomotion Ambulation   Ambulation assist      Assist level: 2 helpers Assistive device: Other (comment) (3 Musketeer) Max distance: 60 ft   Walk 10 feet activity   Assist  Walk 10 feet activity did not occur: Safety/medical concerns  Assist level: Moderate Assistance - Patient - 50 - 74% Assistive device: Other (comment) (R rail)   Walk 50 feet activity   Assist Walk 50 feet with 2 turns activity did not occur: Safety/medical concerns  Assist level: 2 helpers Assistive device: Other (comment) (3 Musketeer)    Walk  150 feet activity   Assist Walk 150 feet activity did not occur: Safety/medical concerns         Walk 10 feet on uneven surface  activity   Assist Walk 10 feet on uneven surfaces activity did not occur: Safety/medical concerns         Wheelchair     Assist Is the patient using a wheelchair?: Yes Type of Wheelchair: Manual    Wheelchair assist level: Dependent - Patient 0%      Wheelchair 50 feet with 2 turns activity    Assist        Assist Level: Dependent - Patient 0%   Wheelchair 150 feet activity     Assist      Assist Level: Dependent - Patient 0%   Blood pressure 104/64, pulse 75, temperature 98.6 F (37 C), temperature source Oral, resp. rate 16, height 6' (1.829 m), weight 92.4 kg, SpO2 97 %.  Medical Problem List and Plan: 1. 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             -patient may shower               -ELOS/Goals: 03/30/22          -Continue CIR therapies including PT, OT, and SLP    2.  Antithrombotics: -DVT/anticoagulation:  pt with left posterior tib dvt.   -began lovenox '40mg'$  q12  -re-doppler 5/24 demonstrated persistent left posterior tib dvt  -re-image prior to discharge to determine a/c need  -activity as tolerated             -antiplatelet therapy: none 3. Pain Management: Tylenol, hydrocodone, robaxin as needed             -Continue Gabapentin '100mg'$  BID -5/27 Gabapentin working well, no changes to plan 4. Mood: team providing emotional support             -antipsychotic agents: n/a             --Depression continue Lexapro 20 mg daily -5/27 Continue Lexapro 20 mg daily 5. Neuropsych: This patient is capable of making decisions on his own behalf. 6. Skin/Wound Care: Routine skin care checks             -- Monitor surgical incision 7. Fluids/Electrolytes/Nutrition:    - continue to push fluids  -protein supp for low albumin 9: History of multiple  meningiomas/s/p resection:             -- gradually tapering steroids off. Discussed effect on CBG's 5/27 Continue tapering plan for steroids, currently on 1 mg decadron   -have discussed with pt             -- Continue  Keppra and Lamictal             -- Calcium carbonate tablets 3 times daily 5/27 Continue Keppra and Lamictal 10: Dyslipidemia: Continue Crestor 20 mg daily 11: History of GERD: Continue Protonix 40 mg daily 12: DM-2: diet controlled (A1c = 5.7 on 02/22/2022)  13: Anemia/acute intra-op blood loss: follow-up CBC 14. Constipation: some results 5/17, none since-  -5/26 SSE yesterday with medium/liquidy results   -Sorbitol + SMOG enema today   - continue  magnesium gluconate '250mg'$  HS  - senna-s bid, miralax bid as mtc regimen -5/27 Patient's LBM 5/25 but complains that he went too long and would prefer to be on home regimen of milk of magnesia which provides better relief.  Places order for MOM prn QHS. 15. Spasticity:   night splint for LLE, tolerating  -continue trial of baclofen '5mg'$  tid for tone-- increased to '10mg'$  tid  -splinting, rom with therapies   -improved -5/27 Continued use of night split along with 10 mg baclofen TID. 16. Hypertension:  controlled 5/26 -continue magnesium gluconate '250mg'$  HS    03/18/2022    1:04 PM 03/18/2022    3:02 AM 03/17/2022   11:08 PM  Vitals with BMI  Systolic 387 564 332  Diastolic 64 73 70  Pulse 75 67 77   5/27 Continue magnesium gluconate 250 mg qHS.    LOS: 11 days A FACE TO FACE EVALUATION WAS PERFORMED  Luetta Nutting 03/18/2022, 2:41 PM

## 2022-03-18 NOTE — Progress Notes (Signed)
Speech Language Pathology Daily Session Note  Patient Details  Name: Patrick Buchanan MRN: [id number] Date of Birth: 05/03/1951  Today's Date: 03/18/2022 SLP Individual Time: 0932-6712 SLP Individual Time Calculation (min): 40 min  Short Term Goals: Week 2: SLP Short Term Goal 1 (Week 2): Pt will self-monitor and self-correct functional errors with sup A verbal cues. - Self-monitored and utilized compensatory word finding strategies with Mod I during structured conversation. Benefited from extra processing time.   SLP Short Term Goal 3 (Week 2): Patient will utilize compensatory memory strategies to recall functional information with sup A verbal and question cues. -  Provided education re: compensatory memory strategy. Recalled 3 out of 4 current medications following 2-minute delay with Mod I; 4 out of 4 with Total A. Following review of medications and modeling of memory strategies pt recalled 3 out of 4. Reports his memory to be consistent with baseline.   Skilled Therapeutic Interventions: Pt seen this date for skilled ST intervention targeting cognitive-linguistic goals outlined above. Pt received sleeping in bed; aroused easily to name. Agreeable to ST intervention. Reports poor sleep due to persistent constipation. Tangential at times, but easily redirected and pleasant. Please see above for objective data collected from today's session.   Pt remains stimulable for skilled ST intervention and is likely approaching d/c from [facility]. Pt benefited from min A verbal redirection throughout session for sustained attention to tasks due to intermittent initiation of side conversation. Pt left in bed with all safety measures activated. Call bell reviewed and within reach and all immediate needs met. Continue per current ST POC.  Pain No pain reported.  Therapy/Group: Individual Therapy  Luiz Trumpower A Zaiyden Strozier 03/18/2022, 12:54 PM

## 2022-03-19 NOTE — Progress Notes (Signed)
PROGRESS NOTE   Subjective/Complaints: Patient seen in room with physical therapy. Reports that he wasn't happy with nursing staff last night, as his head was against the railing and was not repositioned.  He has had issues with constipation, with LBM 03/17/22.   ROS: Patient denies fever, rash, sore throat, blurred vision, dizziness, nausea, vomiting, diarrhea, cough, shortness of breath or chest pain, denies joint, back or neck pain, headache, or mood change.  Objective:   No results found. No results for input(s): WBC, HGB, HCT, PLT in the last 72 hours.   No results for input(s): NA, K, CL, CO2, GLUCOSE, BUN, CREATININE, CALCIUM in the last 72 hours.    Intake/Output Summary (Last 24 hours) at 03/19/2022 1326 Last data filed at 03/19/2022 0738 Gross per 24 hour  Intake 340 ml  Output 725 ml  Net -385 ml        Physical Exam: Vital Signs Blood pressure 114/74, pulse 63, temperature 98.3 F (36.8 C), temperature source Oral, resp. rate 18, height 6' (1.829 m), weight 92.4 kg, SpO2 96 %. Constitutional: No distress . Vital signs reviewed. HEENT: NCAT, EOMI, oral membranes moist Neck: supple Cardiovascular: RRR without murmur. No JVD    Respiratory/Chest: CTA Bilaterally without wheezes or rales. Normal effort    GI/Abdomen: BS +, soft, non-tender, non-distended Ext: no clubbing, cyanosis, or edema Psych: pleasant and cooperative  Skin: Clean and intact without signs of breakdown, scalp remains incision cdi Neuro:  Alert and oriented x 3. Fair insight and awareness. Intact Memory. Normal language and speech. CN II-XII grossly intact.  LUE 0-tr shoulder, bicep, tricep, 3/5 wrist and 3/5 finger flexors. RUE 4-5/5. LLE with 1 to 1+ /5 HE, KE and trace at foot. RLE 4/5. Minimal extensor tone LLE and  minimal  flexor tone in LUE. Sensory: intact to LT and pain in all 4 extremities. DTR's 3+ LLE, 2+ LUE. Musculoskeletal:  Continues to use night splint, heel cord can be stretched easily to 90 degrees.  PE: Unchanged since 03/18/22.  Assessment/Plan: 1. Functional deficits which require 3+ hours per day of interdisciplinary therapy in a comprehensive inpatient rehab setting. Physiatrist is providing close team supervision and 24 hour management of active medical problems listed below. Physiatrist and rehab team continue to assess barriers to discharge/monitor patient progress toward functional and medical goals  Care Tool:  Bathing    Body parts bathed by patient: Left arm, Chest, Abdomen, Front perineal area, Right upper leg, Left upper leg, Face   Body parts bathed by helper: Right arm, Buttocks, Right lower leg, Left lower leg     Bathing assist Assist Level: Maximal Assistance - Patient 24 - 49%     Upper Body Dressing/Undressing Upper body dressing   What is the patient wearing?: Pull over shirt    Upper body assist Assist Level: Moderate Assistance - Patient 50 - 74%    Lower Body Dressing/Undressing Lower body dressing      What is the patient wearing?: Pants     Lower body assist Assist for lower body dressing: Total Assistance - Patient < 25%     Toileting Toileting    Toileting assist Assist for toileting: Total  Assistance - Patient < 25% Assistive Device Comment: urinal   Transfers Chair/bed transfer  Transfers assist     Chair/bed transfer assist level: Moderate Assistance - Patient 50 - 74%     Locomotion Ambulation   Ambulation assist      Assist level: 2 helpers Assistive device: Other (comment) (3 Musketeer) Max distance: 60 ft   Walk 10 feet activity   Assist  Walk 10 feet activity did not occur: Safety/medical concerns  Assist level: Moderate Assistance - Patient - 50 - 74% Assistive device: Other (comment) (R rail)   Walk 50 feet activity   Assist Walk 50 feet with 2 turns activity did not occur: Safety/medical concerns  Assist level: 2  helpers Assistive device: Other (comment) (3 Musketeer)    Walk 150 feet activity   Assist Walk 150 feet activity did not occur: Safety/medical concerns         Walk 10 feet on uneven surface  activity   Assist Walk 10 feet on uneven surfaces activity did not occur: Safety/medical concerns         Wheelchair     Assist Is the patient using a wheelchair?: Yes Type of Wheelchair: Manual    Wheelchair assist level: Dependent - Patient 0%      Wheelchair 50 feet with 2 turns activity    Assist        Assist Level: Dependent - Patient 0%   Wheelchair 150 feet activity     Assist      Assist Level: Dependent - Patient 0%   Blood pressure 114/74, pulse 63, temperature 98.3 F (36.8 C), temperature source Oral, resp. rate 18, height 6' (1.829 m), weight 92.4 kg, SpO2 96 %.  Medical Problem List and Plan: 1. 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             -patient may shower               -ELOS/Goals: 03/30/22          -Continue CIR therapies including PT, OT, and SLP    2.  Antithrombotics: -DVT/anticoagulation:  pt with left posterior tib dvt.  -began lovenox '40mg'$  q12 -re-doppler 5/24 demonstrated persistent left posterior tib dvt -re-image prior to discharge to determine a/c need -activity as tolerated             -antiplatelet therapy: none 3. Pain Management: Tylenol, hydrocodone, robaxin as needed             -Continue Gabapentin '100mg'$  BID -5/28 Gabapentin working well, no changes to plan 4. Mood: team providing emotional support             -antipsychotic agents: n/a             --Depression continue Lexapro 20 mg daily -5/28 Continue Lexapro 20 mg daily 5. Neuropsych: This patient is capable of making decisions on his own behalf. 6. Skin/Wound Care: Routine skin care checks             -- Monitor surgical incision 7. Fluids/Electrolytes/Nutrition:    - continue to  push fluids  -protein supp for low albumin 9: History of multiple meningiomas/s/p resection:             -- gradually tapering steroids off. Discussed effect on CBG's   -have discussed with pt             -- Continue Keppra and Lamictal             --  Calcium carbonate tablets 3 times daily 5/28 Continue Keppra and Lamictal 10: Dyslipidemia: Continue Crestor 20 mg daily 11: History of GERD: Continue Protonix 40 mg daily 12: DM-2: diet controlled (A1c = 5.7 on 02/22/2022)  13: Anemia/acute intra-op blood loss: follow-up CBC 14. Constipation: some results 5/17, none since-  -5/26 SSE yesterday with medium/liquidy results   -Sorbitol + SMOG enema today   - continue  magnesium gluconate '250mg'$  HS  - senna-s bid, miralax bid as mtc regimen -5/27 Patient complains that he went too long and would prefer to be on home regimen of milk of magnesia which provides better relief.  Places order for milk of magnesium prn QHS. 5/28 LBM 5/26, continue prn meds for bowel function 15. Spasticity:   night splint for LLE, tolerating  -continue trial of baclofen '5mg'$  tid for tone-- increased to '10mg'$  tid  -splinting, rom with therapies   -improved -5/28 Continued use of night split along with 10 mg baclofen TID. 16. Hypertension:  controlled 5/26 -continue magnesium gluconate '250mg'$  HS    03/19/2022    2:03 AM 03/18/2022    7:20 PM 03/18/2022    1:04 PM  Vitals with BMI  Systolic 017 99 510  Diastolic 74 62 64  Pulse 63 72 75   5/27 Continue magnesium gluconate 250 mg qHS.    LOS: 12 days A FACE TO FACE EVALUATION WAS PERFORMED  Luetta Nutting 03/19/2022, 1:26 PM

## 2022-03-19 NOTE — Progress Notes (Signed)
Physical Therapy Session Note  Patient Details  Name: Patrick Buchanan MRN: [id number] Date of Birth: 1951-05-15  Today's Date: 03/19/2022 PT Individual Time: 0900-1000 PT Individual Time Calculation (min): 60 min   Short Term Goals: Week 2:  PT Short Term Goal 1 (Week 2): Patient will improve score on Berg Balance Scale by 7 points to meet [name]. PT Short Term Goal 2 (Week 2): Patient will ambulate >100 feet using LRAD with mod A of 1-2 people. PT Short Term Goal 3 (Week 2): Patient will perform basic transfers with min A >25% of the time. PT Short Term Goal 4 (Week 2): Paitent will perform bed mobility with mod A with or without use of bed rails.  Skilled Therapeutic Interventions/Progress Updates:     Patient in bed upon PT arrival. Patient alert and agreeable to PT session. Patient reported un-rated Patrick shoulder pain during session, RN made aware. PT provided repositioning, rest breaks, and distraction as pain interventions throughout session.   Patient with significant concerns about poor attention to patient's needs last night. Patient noted to be saturated and incontinent of urine in the bed upon PT arrival. Patient reports he was very poorly positioned throughout the night and very uncomfortable. Also stated that he did not get OOB yesterday due to not having therapies. PT educated on advocating for care, utilizing strategies discussed in therapy sessions for repositioning, requesting to transfer to the chair outside of therapies (indicated Stedy +2 for transfers on safety plan).   Focused remainder of session with partial bathing and dressing due to incontinence. Transferring OOB with Stedy to simulate nursing transfers, and discussing d/c plan, equipment needs such as power wheelchair, hospital bed, and possible transfer equipment (slide board, Stedy, [employer], vs no AD with squat or stand pivots). Patient receptive throughout the discussion and open to equipment that will support  optimal independence with mobility. Will assess equipment needs throughout the week and plan for w/c evaluation with [facility].  Assessed Patrick hemi-body voluntary muscle activation in supine, upper extremity limited to finger and wrist flexion/extension in synergistic patterns, and some quad and gluteal activation with max cues/facilitation. Patient with limited voluntary motor control return at this time, continue to focus on automatic motor control with functional activities.   Therapeutic Activity: Bed Mobility: Patient performed rolling R/Patrick with max A to the R and min-mod A to the Patrick with use of bed rails. Performed peri-care and changed incontinence brief with total A during rolling. Patient performed supine to sit with mod A with posterior LOB once in sitting requiring max A to prevent falling back. While sitting performed washed patient's back with notable blanchable redness and pressure from prolonged lying, no other signs of skin breakdown noted. Donned a new hospital gown with total A with cues for hemi-technique. Patient performed sitting balance with min A progressing to supervision with cues for erect posture and reduced Patrick lean.  Transfers: Patient performed a dependent transfer bed>TIS w/c using the Stedy with min-mod A for sit to stands in the [place]. Performed scooting back with mod A x1. Also educated patient on use of Tilt feature to allow gravity to move his hips back in the w/c to instruct nursing staff for repositioning and pressure relief while in the chair.  Patient in TIS w/c, agreeable to sitting up x1 hour, at end of session with breaks locked, seat belt alarm set, and all needs within reach.   Therapy Documentation Precautions:  Precautions Precautions: Fall, Other (comment) Precaution Comments: Patrick  hemi Required Braces or Orthoses: Other Brace (Wrist splint and PRAFO) Other Brace: Wrist splint and PRAFO Restrictions Weight Bearing Restrictions: No   Therapy/Group: Individual  Therapy  Patrick Buchanan Patrick Buchanan PT, DPT  03/19/2022, 7:18 PM

## 2022-03-20 MED ORDER — MAGNESIUM HYDROXIDE 400 MG/5ML PO SUSP
30.0000 mL | Freq: Every day | ORAL | Status: DC
  Administered 2022-03-20 – 2022-03-21 (×2): 30 mL via ORAL
  Filled 2022-03-20 (×3): qty 30

## 2022-03-20 NOTE — Progress Notes (Signed)
PROGRESS NOTE   Subjective/Complaints:  Pt reports no new issues today. Hasn't moved bowels since Friday when he had a large one.    ROS: Patient denies fever, rash, sore throat, blurred vision, dizziness, nausea, vomiting, diarrhea, cough, shortness of breath or chest pain, joint or back/neck pain, headache, or mood change.    Objective:   No results found. No results for input(s): WBC, HGB, HCT, PLT in the last 72 hours.   No results for input(s): NA, K, CL, CO2, GLUCOSE, BUN, CREATININE, CALCIUM in the last 72 hours.    Intake/Output Summary (Last 24 hours) at 03/20/2022 1049 Last data filed at 03/20/2022 0340 Gross per 24 hour  Intake 240 ml  Output 1300 ml  Net -1060 ml        Physical Exam: Vital Signs Blood pressure 103/65, pulse 60, temperature (!) 97.5 F (36.4 C), temperature source Oral, resp. rate 18, height 6' (1.829 m), weight 92.4 kg, SpO2 96 %. Constitutional: No distress . Vital signs reviewed. HEENT: NCAT, EOMI, oral membranes moist Neck: supple Cardiovascular: RRR without murmur. No JVD    Respiratory/Chest: CTA Bilaterally without wheezes or rales. Normal effort    GI/Abdomen: BS +, non-tender, non-distended Ext: no clubbing, cyanosis, or edema Psych: pleasant and cooperative  Skin: Clean and intact without signs of breakdown, scalp remains incision cdi Neuro:  Alert and oriented x 3. Fair insight and awareness. Intact Memory. Normal language and speech. CN II-XII grossly intact.  LUE 0-tr shoulder, bicep, tricep, 3+/5 wrist and 3+/5 finger flexors. RUE 4-5/5. LLE with 1 to 1+ /5 HE, KE and trace at foot. RLE 4/5. Minimal extensor tone LLE and  minimal  flexor tone in LUE. Sensory: intact to LT and pain in all 4 extremities. DTR's 3+ LLE, 2+ LUE. Musculoskeletal: heel cord flexible.     Assessment/Plan: 1. Functional deficits which require 3+ hours per day of interdisciplinary therapy in a  comprehensive inpatient rehab setting. Physiatrist is providing close team supervision and 24 hour management of active medical problems listed below. Physiatrist and rehab team continue to assess barriers to discharge/monitor patient progress toward functional and medical goals  Care Tool:  Bathing    Body parts bathed by patient: Left arm, Chest, Abdomen, Front perineal area, Right upper leg, Left upper leg, Face   Body parts bathed by helper: Right arm, Buttocks, Right lower leg, Left lower leg     Bathing assist Assist Level: Maximal Assistance - Patient 24 - 49%     Upper Body Dressing/Undressing Upper body dressing   What is the patient wearing?: Pull over shirt    Upper body assist Assist Level: Moderate Assistance - Patient 50 - 74%    Lower Body Dressing/Undressing Lower body dressing      What is the patient wearing?: Pants     Lower body assist Assist for lower body dressing: Total Assistance - Patient < 25%     Toileting Toileting    Toileting assist Assist for toileting: Total Assistance - Patient < 25% Assistive Device Comment: urinal   Transfers Chair/bed transfer  Transfers assist     Chair/bed transfer assist level: Moderate Assistance - Patient 50 - 74%  Locomotion Ambulation   Ambulation assist      Assist level: 2 helpers Assistive device: Other (comment) (3 Musketeer) Max distance: 60 ft   Walk 10 feet activity   Assist  Walk 10 feet activity did not occur: Safety/medical concerns  Assist level: Moderate Assistance - Patient - 50 - 74% Assistive device: Other (comment) (R rail)   Walk 50 feet activity   Assist Walk 50 feet with 2 turns activity did not occur: Safety/medical concerns  Assist level: 2 helpers Assistive device: Other (comment) (3 Musketeer)    Walk 150 feet activity   Assist Walk 150 feet activity did not occur: Safety/medical concerns         Walk 10 feet on uneven surface   activity   Assist Walk 10 feet on uneven surfaces activity did not occur: Safety/medical concerns         Wheelchair     Assist Is the patient using a wheelchair?: Yes Type of Wheelchair: Manual    Wheelchair assist level: Dependent - Patient 0%      Wheelchair 50 feet with 2 turns activity    Assist        Assist Level: Dependent - Patient 0%   Wheelchair 150 feet activity     Assist      Assist Level: Dependent - Patient 0%   Blood pressure 103/65, pulse 60, temperature (!) 97.5 F (36.4 C), temperature source Oral, resp. rate 18, height 6' (1.829 m), weight 92.4 kg, SpO2 96 %.  Medical Problem List and Plan: 1. 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             -patient may shower               -ELOS/Goals: 03/30/22           -Continue CIR therapies including PT, OT, and SLP    2.  Antithrombotics: -DVT/anticoagulation:  pt with left posterior tib dvt.  -  lovenox '40mg'$  q12 -re-doppler 5/24 demonstrated persistent left posterior tib dvt -re-image prior to discharge to determine a/c need -activity as tolerated             -antiplatelet therapy: none 3. Pain Management: Tylenol, hydrocodone, robaxin as needed             - Gabapentin '100mg'$  BID   4. Mood: team providing emotional support             -antipsychotic agents: n/a             --Depression continue Lexapro 20 mg daily   5. Neuropsych: This patient is capable of making decisions on his own behalf. 6. Skin/Wound Care: Routine skin care checks             -- Monitor surgical incision 7. Fluids/Electrolytes/Nutrition:    - continue to push fluids  -protein supp for low albumin 9: History of multiple meningiomas/s/p resection:             -- gradually tapering steroids off. Discussed effect on CBG's   -have discussed with pt             -- Continue Keppra and Lamictal             -- Calcium carbonate tablets 3 times  daily   -continue above, off steroids 10: Dyslipidemia: Continue Crestor 20 mg daily 11: History of GERD: Continue Protonix 40 mg daily 12: DM-2: diet controlled (A1c = 5.7  on 02/22/2022)  13: Anemia/acute intra-op blood loss: follow-up CBC 14. Constipation: some results 5/17, none since-  -5/26 --last bm   - continue  magnesium gluconate '250mg'$  HS  - senna-s bid, miralax bid as mtc regimen  -will add MOM 30cc at bedtime per home regimen to above 15. Spasticity:   night splint for LLE, tolerating  -continue trial of baclofen '10mg'$  tid  -splinting, rom with therapies   -improved -  16. Hypertension:  controlled 5/26 -continue magnesium gluconate '250mg'$  HS    03/20/2022    4:12 AM 03/19/2022    7:08 PM 03/19/2022    2:23 PM  Vitals with BMI  Systolic [phone]  Diastolic 65 74 73  Pulse 60 74 70   5/29 Continue magnesium gluconate 250 mg qHS.    LOS: 13 days A FACE TO [place] 03/20/2022, 10:49 AM

## 2022-03-20 NOTE — Progress Notes (Signed)
Physical Therapy Session Note  Patient Details  Name: Patrick Buchanan MRN: [id number] Date of Birth: 09-01-1951  Today's Date: 03/20/2022 PT Individual Time: 1300-1425 PT Individual Time Calculation (min): 85 min   Short Term Goals: Week 2:  PT Short Term Goal 1 (Week 2): Patient will improve score on Berg Balance Scale by 7 points to meet [name]. PT Short Term Goal 2 (Week 2): Patient will ambulate >100 feet using LRAD with mod A of 1-2 people. PT Short Term Goal 3 (Week 2): Patient will perform basic transfers with min A >25% of the time. PT Short Term Goal 4 (Week 2): Paitent will perform bed mobility with mod A with or without use of bed rails.  Skilled Therapeutic Interventions/Progress Updates:     Patient in bed upon PT arrival. Patient alert and agreeable to PT session. Patient denied pain during session.  Therapeutic Activity: Bed Mobility: Patient performed rolling R/L with mod-max A to doff/don incontinence brief due to spillage from urinal prior to session. Performed peri-care with total A. Donned pants with total A with patient performing bridging with mod facilitation through L lower extremity to engage gluteal activation. He performed supine to sit with mod A and sit to supine with max A. Provided verbal cues and facilitation for L hemi-body management, sequencing, progressing through side-lying, and use of R upper extremity for trunk control with use of the bed rail. Transfers: Patient performed squat pivot bed >w/c and w/c<>mat table with mod-max A with PT blocking L knee and foot throughout. Provided cues for hand placement, initiation, and head-hips relationship. He performed a dependent transfer using the [facility] w/c>bed with min A for standing in the [place]. Pants doffed in standing and sitting with total A during return to bed. Provided verbal cues and facilitation for L hemi-body placement and knee/hip/trunk extension.  Wheelchair Mobility:  Patient introduced to a  mid-wheel power wheelchair with R joystick controls. Reviewed seating controls and used for pressure relief, voiding using a urinal, lower extremity elevation, and seat elevator for reaching elevated surfaces. Patient able to control all seat functions with supervision and min cues. He propelled the power wheelchair >150 feet with supervision and intermittent min A due to decreased attention to the L while turning and steering. Provided verbal cues for steering technique and max cues for attention to the L to prevent injury.  Neuromuscular Re-ed: Patient performed the following sitting balance and lower extremity motor control activities: -sitting balance >16 min EOM focused on midline orientation with a mirror for visual feedback, progressed to postural symmetry to reduce L thoracic curve and R pelvic obliquity with cues and facilitation for R trunk elongation and attention to L trunk and arm with head in midline; required min A-supervision for sitting balance throughout -performed sit to/from stand x1 with max A +2, stood x2 min focused on L hip/knee/trunk extension and quad/gluteal activation  -rocking forward and backward x5 with mod facilitation for weight bearing through B lower extremities focused on lifting hips off the mat to improve forward weight shift and head hips relationship with transfers  Patient in bed handed off to NT for positioning with DF adjustable night splint donned at end of session with breaks locked, bed alarm set, and all needs within reach.   Therapy Documentation Precautions:  Precautions Precautions: Fall, Other (comment) Precaution Comments: L hemi Required Braces or Orthoses: Other Brace (Wrist splint and PRAFO) Other Brace: Wrist splint and PRAFO Restrictions Weight Bearing Restrictions: No    Therapy/Group: Individual  Therapy  Patrick Buchanan L Patrick Buchanan PT, DPT  03/20/2022, 4:01 PM

## 2022-03-20 NOTE — Progress Notes (Signed)
Occupational Therapy Session Note  Patient Details  Name: Patrick Buchanan MRN: [id number] Date of Birth: 1951/03/14  Today's Date: 03/20/2022 OT Individual Time: 0915-1030 OT Individual Time Calculation (min): 75 min    Short Term Goals: Week 1:  OT Short Term Goal 1 (Week 1): Pt will sit dynamically at EOB or EOM for 10 min with no more than CGA for balance corrections OT Short Term Goal 1 - Progress (Week 1): Progressing toward goal OT Short Term Goal 2 (Week 1): Pt will complete toilet transfer with MAX A of 1 and LRAD OT Short Term Goal 2 - Progress (Week 1): Met OT Short Term Goal 3 (Week 1): Pt will sit to stand with MOD A Overall in prep for LB and toileting tasks OT Short Term Goal 3 - Progress (Week 1): Met OT Short Term Goal 4 (Week 1): Pt will recall hemi strategies with MIN cuing OT Short Term Goal 4 - Progress (Week 1): Progressing toward goal  Skilled Therapeutic Interventions/Progress Updates:     Pt received in bed with no pain reported  ADL: Pt completes ADL at overall min-MAX Level. Skilled interventions include: cuing for hemi dressing technique, min A for sitting balance dynamically on EOB with UB dressing/MOD A reaching towards floor for LB dressing balance at EOB, Pt able to verbalize all techniques for UB, but continues to require encouragement/strategies for dense hemi limb management. Pt able to don shirt with MIN A (!!) this date EOB, pants with MAXA at sit to stand. Pt requires MAX A for reciprocal scooting to EOB to improve body positioning for transitional movements. MAX A squat pivot to w/c with facilaition of trunk control and LE management.    Therapeutic activity Blocked practice of SB transfers with MOD A overall and pt stating, "I dont like this" prior to even completing transfer. Pt completes static sitting balance at EOB for 5 min with hands in lap with pt able to maintain balance while having conversation and sitting statically.  Pt left at end of  session in TIS with exit alarm on, call light in reach and all needs met   Therapy Documentation Precautions:  Precautions Precautions: Fall, Other (comment) Precaution Comments: L hemi Required Braces or Orthoses: Other Brace (Wrist splint and PRAFO) Other Brace: Wrist splint and PRAFO Restrictions Weight Bearing Restrictions: No General:    Therapy/Group: Individual Therapy  [name] 03/20/2022, 6:51 AM

## 2022-03-20 NOTE — Progress Notes (Signed)
Speech Language Pathology Daily Session Note  Patient Details  Name: Patrick Buchanan MRN: [id number] Date of Birth: 10-Dec-1950  Today's Date: 03/20/2022 SLP Individual Time: 5027-7412 SLP Individual Time Calculation (min): 45 min  Short Term Goals: Week 2: SLP Short Term Goal 1 (Week 2): Pt will self-monitor and self-correct functional errors with sup A verbal cues. SLP Short Term Goal 2 (Week 2): Pt will self-monitor and self-correct functional errors with sup A verbal cues. SLP Short Term Goal 3 (Week 2): Patient will utilize compensatory memory strategies to recall functional information with sup A verbal and question cues  Skilled Therapeutic Interventions: Skilled treatment session focused on cognitive goals. SLP facilitated session by administering the Cognistat to assess progress and overall cognitive functioning. Patient scored WFL on all subtests with the exception of mild impairments in short-term recall. Despite results of standardized assessment, patient is recalling functional information more appropriately with overall supervision level verbal cues. Suspect patient is approaching is cognitive baseline and will be ready to discharge from skilled SLP intervention within the week. Patient left upright in bed with alarm on and all needs within reach. Continue with current plan of care.      Pain Pain Assessment Pain Scale: 0-10 Pain Score: 0-No pain  Therapy/Group: Individual Therapy  Patrick Buchanan 03/20/2022, 12:18 PM

## 2022-03-21 ENCOUNTER — Encounter: Payer: Self-pay | Admitting: Physical Therapy

## 2022-03-21 LAB — BASIC METABOLIC PANEL
Anion gap: 6 (ref 5–15)
BUN: 15 mg/dL (ref 8–23)
CO2: 30 mmol/L (ref 22–32)
Calcium: 8.4 mg/dL — ABNORMAL LOW (ref 8.9–10.3)
Chloride: 103 mmol/L (ref 98–111)
Creatinine, Ser: 0.77 mg/dL (ref 0.61–1.24)
GFR, Estimated: >60 mL/min (ref >60)
Glucose, Bld: 115 mg/dL — ABNORMAL HIGH (ref 70–99)
Potassium: 4.1 mmol/L (ref 3.5–5.1)
Sodium: 139 mmol/L (ref 135–145)

## 2022-03-21 LAB — CBC
HCT: 31.6 % — ABNORMAL LOW (ref 39.0–52.0)
Hemoglobin: 10.4 g/dL — ABNORMAL LOW (ref 13.0–17.0)
MCH: 30.9 pg (ref 26.0–34.0)
MCHC: 32.9 g/dL (ref 30.0–36.0)
MCV: 93.8 fL (ref 80.0–100.0)
Platelets: 128 10*3/uL — ABNORMAL LOW (ref 150–400)
RBC: 3.37 MIL/uL — ABNORMAL LOW (ref 4.22–5.81)
RDW: 14.7 % (ref 11.5–15.5)
WBC: 4.9 10*3/uL (ref 4.0–10.5)
nRBC: 0 % (ref 0.0–0.2)

## 2022-03-21 MED ORDER — SENNOSIDES-DOCUSATE SODIUM 8.6-50 MG PO TABS
2.0000 | ORAL_TABLET | Freq: Two times a day (BID) | ORAL | Status: DC
  Administered 2022-03-21 – 2022-03-23 (×4): 2 via ORAL
  Filled 2022-03-21 (×4): qty 2

## 2022-03-21 MED ORDER — SORBITOL 70 % SOLN
60.0000 mL | Status: AC
  Administered 2022-03-21: 60 mL via ORAL
  Filled 2022-03-21: qty 60

## 2022-03-21 MED ORDER — SORBITOL 70 % SOLN
960.0000 mL | TOPICAL_OIL | Freq: Once | ORAL | Status: DC
  Filled 2022-03-21: qty 473

## 2022-03-21 NOTE — Patient Care Conference (Signed)
Inpatient RehabilitationTeam Conference and Plan of Care Update Date: 03/21/2022   Time: 10:11 AM    Patient Name: Patrick Buchanan      Medical Record Number: [id number]  Date of Birth: Jan 17, 1951 Sex: Male         Room/Bed: 4W17C/4W17C-01 Payor Info: Payor: [employer] MEDICARE / Plan: [place] HMO / Product Type: *No Product type* /    Admit Date/Time:  03/07/2022  2:58 PM  Primary Diagnosis:  Meningioma [facility])  Hospital Problems: Principal Problem:   Meningioma [facility])    Expected Discharge Date: Expected Discharge Date: 03/30/22  Team Members Present: Physician leading conference: Dr. Alger Simons Social Worker Present: Loralee Pacas, [place] Nurse Present: Dorthula Nettles, RN PT Present: Apolinar Junes, PT OT Present: Laverle Hobby, OT SLP Present: Weston Anna, SLP PPS Coordinator present : Gunnar Fusi, SLP     Current Status/Progress Goal Weekly Team Focus  Bowel/Bladder   continent bladder, incontinent bowel, constipation  regain continent  toilet q 2 hr and address constipation   Swallow/Nutrition/ Hydration             ADL's   supervision static sitting balance, up to MOD A dynamic sitting in ADL, min A UB/ MAX A LB, MAX A toileting/trasnfers  min A overall- likley to adjust to reflect use of stedy to reduce BOC  sitting balance, directing care, ADL retraining, LUE NMR, ADL transfers   Mobility   Mod A overall, therapeutic gait >50 feet max A +2, limited progress with L LE motor activation this week, initiated education and trials with power w/c mobility  CGA-min A overall, likely downgrade to mod A w/c level if progress remains limited  balance, midline orientation, activity tolerance, L hemi-body NMR, functional mobility, w/c  mobility, patient/caregiver education   Communication             Safety/Cognition/ Behavioral Observations  mod I-to-sup A  mod I  high level problem solving, memory recall strategies - will likely d/c this week   Pain    reports minor pain  < 3  assess pain q 4 hr and prn   Skin   incision to head CDI  no new breakdown  assess skin q shift and prn     Discharge Planning:  Pt to d/c to home with support from daughters and hired help. fam edu completed on Friday (5/26) with pt dtr [name] and next fam edu session on Tuesday (6/6) 1pm-3:30pm with pt dtr [name].   Team Discussion: On-going constipation. Doppler revealed DVT to distal extremity, plan to recheck before discharge. Discontinued steroids, blood sugars improved. Incontinent bowel, continent bladder. Reports minor pain, incision CDI. Daughter attending family education. Will put in for power wheelchair consult and eval.  Patient on target to meet rehab goals: yes, min assist to mod assist goals. Currently supervision to mod assist dynamic standing. Starting Estem on left shoulder. Not progressing with left motor return. Daughter reports close to baseline with SLP.  *See Care Plan and progress notes for long and short-term goals.   Revisions to Treatment Plan:  Adjusting medications, addressing constipation.   Teaching Needs: Family education, medication/pain management, bowel management, skin/wound care, transfer/gait training, etc.   Current Barriers to Discharge: Decreased caregiver support, Home enviroment access/layout, Incontinence, Wound care, and Lack of/limited family support  Possible Resolutions to Barriers: Family education Family to provide Steady for home use Follow-up therapy     Medical Summary Current Status: tone controlled on left, ongoing constipation, cognition improved.  Barriers  to Discharge: Medical stability   Possible Resolutions to [employer] Focus: daily assessment of labs and pt data, spasticity mgt, find a bowel regimen which works   Continued Need for Acute Rehabilitation Level of Care: The patient requires daily medical management by a physician with specialized training in physical medicine and  rehabilitation for the following reasons: Direction of a multidisciplinary physical rehabilitation program to maximize functional independence : Yes Medical management of patient stability for increased activity during participation in an intensive rehabilitation regime.: Yes Analysis of laboratory values and/or radiology reports with any subsequent need for medication adjustment and/or medical intervention. : Yes   I attest that I was present, lead the team conference, and concur with the assessment and plan of the team.   Cristi Loron 03/21/2022, 12:55 PM

## 2022-03-21 NOTE — Progress Notes (Signed)
Speech Language Pathology Daily Session Note  Patient Details  Name: Patrick Buchanan MRN: [id number] Date of Birth: December 19, 1950  Today's Date: 03/21/2022 SLP Individual Time: 1047-1130 SLP Individual Time Calculation (min): 43 min  Short Term Goals: Week 2: SLP Short Term Goal 1 (Week 2): Pt will self-monitor and self-correct functional errors with sup A verbal cues. SLP Short Term Goal 2 (Week 2): Pt will self-monitor and self-correct functional errors with sup A verbal cues. SLP Short Term Goal 3 (Week 2): Patient will utilize compensatory memory strategies to recall functional information with sup A verbal and question cues  Skilled Therapeutic Interventions: Skilled ST treatment focused on cognitive goals. SLP facilitated session by providing set-up A by providing number for MyChart support line per pt request. Pt requesting to have Dr. Naaman Plummer added as a provider to send secure chat messages regarding his care. Pt was told Dr. Naaman Plummer cannot be added to pt's list of provider until pt discharges CIR. SLP educated on compensatory memory strategies and suggested to write down questions/comments/concerns to communicate with Dr. Naaman Plummer during morning rounds. D/t poor legibility, pt receptive to suggestion to use notes app in phone to input questions vs. written notes. Pt located notes app and utilized voice-to-text feature to input questions with 100% effectiveness at mod I level. SLP discussed results of cognitive reassessment. Pt pleased with results and in agreement with being at/near cognitive baseline. Plan to discharge pt from speech caseload at this time. Pt in agreement. No follow up services appear clinically warranted at this time. Patient was left in bed with alarm activated and immediate needs within reach at end of session.     Pain  None denied  Therapy/Group: Individual Therapy  Wilkie Zenon T Enrique Manganaro 03/21/2022, 11:13 AM

## 2022-03-21 NOTE — Progress Notes (Signed)
PROGRESS NOTE   Subjective/Complaints:  Pt had a reasonable night. No new complaints today. Ongoing weakness left arm and leg. Therapy noting more subluxation of shoulder. Patient has it propped up in bed. Constipated again   ROS: Patient denies fever, rash, sore throat, blurred vision, dizziness, nausea, vomiting, diarrhea, cough, shortness of breath or chest pain, joint or back/neck pain, headache, or mood change.    Objective:   No results found. Recent Labs    03/21/22 0520  WBC 4.9  HGB 10.4*  HCT 31.6*  PLT 128*     Recent Labs    03/21/22 0520  NA 139  K 4.1  CL 103  CO2 30  GLUCOSE 115*  BUN 15  CREATININE 0.77  CALCIUM 8.4*      Intake/Output Summary (Last 24 hours) at 03/21/2022 1203 Last data filed at 03/21/2022 1100 Gross per 24 hour  Intake 240 ml  Output 1150 ml  Net -910 ml        Physical Exam: Vital Signs Blood pressure 134/65, pulse 60, temperature 98.3 F (36.8 C), temperature source Oral, resp. rate 18, height 6' (1.829 m), weight 92.4 kg, SpO2 96 %. Constitutional: No distress . Vital signs reviewed. HEENT: NCAT, EOMI, oral membranes moist Neck: supple Cardiovascular: RRR without murmur. No JVD    Respiratory/Chest: CTA Bilaterally without wheezes or rales. Normal effort    GI/Abdomen: BS +, non-tender, non-distended Ext: no clubbing, cyanosis, or edema Psych: pleasant and cooperative  Skin: Clean and intact without signs of breakdown, scalp remains incision cdi Neuro:  Alert and oriented x 3. Fair insight and awareness. Intact Memory. Normal language and speech. CN II-XII grossly intact.  LUE 0-tr shoulder, bicep, tricep, 3+/5 wrist and 3+/5 finger flexors. RUE 4-5/5. LLE with 1 to 1+ /5 HE, KE and trace at foot. RLE 4/5. Minimal extensor tone LLE and  minimal  flexor tone in LUE. Sensory: intact to LT and pain in all 4 extremities. DTR's 3+ LLE, 2+ LUE. Musculoskeletal: heel  cord flexible. Left shoulder subluxed 1/2"     Assessment/Plan: 1. Functional deficits which require 3+ hours per day of interdisciplinary therapy in a comprehensive inpatient rehab setting. Physiatrist is providing close team supervision and 24 hour management of active medical problems listed below. Physiatrist and rehab team continue to assess barriers to discharge/monitor patient progress toward functional and medical goals  Care Tool:  Bathing    Body parts bathed by patient: Left arm, Chest, Abdomen, Front perineal area, Right upper leg, Left upper leg, Face   Body parts bathed by helper: Right arm, Buttocks, Right lower leg, Left lower leg     Bathing assist Assist Level: Maximal Assistance - Patient 24 - 49%     Upper Body Dressing/Undressing Upper body dressing   What is the patient wearing?: Pull over shirt    Upper body assist Assist Level: Moderate Assistance - Patient 50 - 74%    Lower Body Dressing/Undressing Lower body dressing      What is the patient wearing?: Pants     Lower body assist Assist for lower body dressing: Total Assistance - Patient < 25%     Toileting Toileting  Toileting assist Assist for toileting: Total Assistance - Patient < 25% Assistive Device Comment: urinal   Transfers Chair/bed transfer  Transfers assist     Chair/bed transfer assist level: Moderate Assistance - Patient 50 - 74%     Locomotion Ambulation   Ambulation assist      Assist level: 2 helpers Assistive device: Other (comment) (3 Musketeer) Max distance: 60 ft   Walk 10 feet activity   Assist  Walk 10 feet activity did not occur: Safety/medical concerns  Assist level: Moderate Assistance - Patient - 50 - 74% Assistive device: Other (comment) (R rail)   Walk 50 feet activity   Assist Walk 50 feet with 2 turns activity did not occur: Safety/medical concerns  Assist level: 2 helpers Assistive device: Other (comment) (3 Musketeer)    Walk  150 feet activity   Assist Walk 150 feet activity did not occur: Safety/medical concerns         Walk 10 feet on uneven surface  activity   Assist Walk 10 feet on uneven surfaces activity did not occur: Safety/medical concerns         Wheelchair     Assist Is the patient using a wheelchair?: Yes Type of Wheelchair: Manual    Wheelchair assist level: Dependent - Patient 0%      Wheelchair 50 feet with 2 turns activity    Assist        Assist Level: Dependent - Patient 0%   Wheelchair 150 feet activity     Assist      Assist Level: Dependent - Patient 0%   Blood pressure 134/65, pulse 60, temperature 98.3 F (36.8 C), temperature source Oral, resp. rate 18, height 6' (1.829 m), weight 92.4 kg, SpO2 96 %.  Medical Problem List and Plan: 1. 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             -patient may shower               -ELOS/Goals: 03/30/22           -Continue CIR therapies including PT, OT, and SLP. Interdisciplinary team conference today to discuss goals, barriers to discharge, and dc planning.    2.  Antithrombotics: -DVT/anticoagulation:  pt with left posterior tib dvt.  -  lovenox '40mg'$  q12 -re-doppler 5/24 demonstrated persistent left posterior tib dvt -re-image prior to discharge to determine a/c need -activity as tolerated             -antiplatelet therapy: none 3. Pain Management: Tylenol, hydrocodone, robaxin as needed             - Gabapentin '100mg'$  BID   -elevation/support of left shoulder for sublux. Harness?   -estim per OT 4. Mood: team providing emotional support             -antipsychotic agents: n/a             --Depression continue Lexapro 20 mg daily   5. Neuropsych: This patient is capable of making decisions on his own behalf. 6. Skin/Wound Care: Routine skin care checks             -- Monitor surgical incision 7. Fluids/Electrolytes/Nutrition:    -  continue to push fluids  -protein supp for low albumin 9: History of multiple meningiomas/s/p resection:             -- Continue Keppra and Lamictal             --  Calcium carbonate tablets 3 times daily   -continue above, off steroids now 10: Dyslipidemia: Continue Crestor 20 mg daily 11: History of GERD: Continue Protonix 40 mg daily 12: DM-2: diet controlled (A1c = 5.7 on 02/22/2022) 13: Anemia/acute intra-op blood loss: follow up hgb a little lower to 10.4 but he has fluctuated between 10-12 during this admission--repeat later in week 14. Constipation: some results 5/17, none since-  -5/26 --last bm   - continue  magnesium gluconate '250mg'$  HS  - senna-s bid, miralax bid as mtc regimen  - added  MOM 30cc at bedtime per home regimen to above  -5/30 sorbitol and SMOG enema if needed today   -increased senna to 2 tabs bid 15. Spasticity:   night splint for LLE, tolerating  -continue trial of baclofen '10mg'$  tid  -splinting, rom with therapies   -improved -  16. Hypertension:  controlled 5/26 -continue magnesium gluconate '250mg'$  HS    03/21/2022    3:27 AM 03/20/2022    7:00 PM 03/20/2022    2:35 PM  Vitals with BMI  Systolic 888 916 94  Diastolic 65 71 74  Pulse 60 74 75   5/29 Continue magnesium gluconate 250 mg qHS.    LOS: 14 days A FACE TO FACE EVALUATION WAS PERFORMED  Meredith Staggers 03/21/2022, 12:03 PM

## 2022-03-21 NOTE — Progress Notes (Signed)
Speech Language Pathology Discharge Summary  Patient Details  Name: Patrick Buchanan MRN: [id number] Date of Birth: 05/15/1951  Patient has met 4 of 4 long term goals.  Patient to discharge at overall Modified Independent level.  Reasons goals not met: All goals met   Clinical Impression/Discharge Summary: Patient has made functional gains and has met 4 of 4 long-term goals this admission due to improved problem solving, attention, emergent awareness, and functional recall with use of compensatory memory strategies. Patient and family education is complete and patient to discharge at overall mod I level. Patient's care partner is independent to provide intermittent cognitive assistance at discharge as needed. Continued SLP services do not appear indicated as this time, as pt is likely at/near cognitive baseline per further assessment and discussion with pt/daughter.  Care Partner:  Caregiver Able to Provide Assistance: Other (comment) (Yes - intermittent support)  Type of Caregiver Assistance: Cognitive  Recommendation:  None     Equipment: None provided or recommended by SLP   Reasons for discharge: Treatment goals met   Patient/Family Agrees with Progress Made and Goals Achieved: Yes   Jasreet Dickie T Shamina Etheridge 03/21/2022, 4:31 PM

## 2022-03-21 NOTE — Plan of Care (Signed)
  Problem: RH Problem Solving Goal: LTG Patient will demonstrate problem solving for (SLP) Description: LTG:  Patient will demonstrate problem solving for basic/complex daily situations with cues  (SLP) Outcome: Completed/Met   Problem: RH Attention Goal: LTG Patient will demonstrate this level of attention during functional activites (SLP) Description: LTG:  Patient will will demonstrate this level of attention during functional activites (SLP) Outcome: Completed/Met   Problem: RH Awareness Goal: LTG: Patient will demonstrate awareness during functional activites type of (SLP) Description: LTG: Patient will demonstrate awareness during functional activites type of (SLP) Outcome: Completed/Met   Problem: RH Memory Goal: LTG Patient will use memory compensatory aids to (SLP) Description: LTG:  Patient will use memory compensatory aids to recall biographical/new, daily complex information with cues (SLP) Outcome: Completed/Met

## 2022-03-21 NOTE — Progress Notes (Addendum)
Patient ID: Patrick Buchanan, male   DOB: May 10, 1951, 70 y.o.   MRN: [id number]  SW received message from pt dtr [name] reporting her sister [name] will come in for family edu on 6/6 1pm-3:30pm.  SW met with pt in room to provide updates from team conference, and d/c date remains 6/8.  1500- SW spoke with pt dtr [name] to discuss d/c recommendations. SW will order hospital bed (prefers fully electric) and TTB. SW shared will confirm if DABSC or 3in1 BSC is needed at d/c. She will f/u with SW about HHA preference.  SW ordered hospital bed and TTB with [facility] via parachute.   Loralee Pacas, MSW, [place] Office: [phone] Cell: [phone] Fax: [phone]

## 2022-03-21 NOTE — Progress Notes (Signed)
Occupational Therapy Session Note  Patient Details  Name: Patrick Buchanan MRN: [id number] Date of Birth: 1950/12/25  Today's Date: 03/21/2022 OT Individual Time: 0905-1000 OT Individual Time Calculation (min): 55 min    Short Term Goals: Week 2:  OT Short Term Goal 1 (Week 2): Pt will maintain dynamic sitting balance for 10 min EOB/EOM to improve midline orientation OT Short Term Goal 2 (Week 2): Pt will recall hemi strategies wiht min cuing for UB dressing OT Short Term Goal 3 (Week 2): Pt will complete toilet transfers wiht MOD A consistently OT Short Term Goal 4 (Week 2): pt will use LUE in grooming tasks with MOD A to demo improved L attetnion/funcitonal use  Skilled Therapeutic Interventions/Progress Updates:    Pt received supine with no c/o pain, agreeable to OT session. He required max A to don pants bed level. Mod A to transfer from supine to EOB. Sit > stand using the stedy with mod A. Min A to maintain midline orientation. Pt transferred to the power w/c. Pt was able to do navigate the w/c to the therapy gym with min cueing. Remainder of session focused on LUE NMR and shoulder positioning. Pt with severely protracted and atrophied subscapularis and deltoid. Humerus with 2 breath anterior and inferior subluxation. Pt only reporting pain when rolling onto the shoulder.  1:1 NMES applied to supraspinatus and middle deltoid to help approximate shoulder joint to reduce sublux and reduce pain.  Ratio 1:3 Rate 35 pps Waveform- Asymmetric Ramp 1.0 Pulse 300 Intensity- 30 Duration -  20 min Report of pain at the beginning of session 0/10 Report of pain at the end of session 0/10 No adverse reactions after treatment and is skin intact.   Active assist provided throughout NMES treatment with focus on joint approximation. Pt returned to his room and was transferred back to his bed via stedy. Pt was left supine with all needs met, bed alarm set.    Therapy  Documentation Precautions:  Precautions Precautions: Fall, Other (comment) Precaution Comments: L hemi Required Braces or Orthoses: Other Brace (Wrist splint and PRAFO) Other Brace: Wrist splint and PRAFO Restrictions Weight Bearing Restrictions: No  Therapy/Group: Individual Therapy  Curtis Sites 03/21/2022, 6:47 AM

## 2022-03-21 NOTE — Progress Notes (Signed)
Physical Therapy Session Note  Patient Details  Name: Patrick Buchanan MRN: [id number] Date of Birth: [birth year]  Today's Date: 03/21/2022 PT Individual Time: 0830-0900 PT Individual Time Calculation (min): 30 min   Short Term Goals: Week 1:  PT Short Term Goal 1 (Week 1): Patient will perform bed mobility with mod A in a flat bed with or without use of bed rails. PT Short Term Goal 1 - Progress (Week 1): Progressing toward goal PT Short Term Goal 2 (Week 1): Patient will perform basic transfers with mod A consistently. PT Short Term Goal 2 - Progress (Week 1): Met PT Short Term Goal 3 (Week 1): Patient will ambulate >20 feet using LRAD with max A of 1-2 people. PT Short Term Goal 3 - Progress (Week 1): Met PT Short Term Goal 4 (Week 1): Patient will improve score on Berg Balance Scale by 7 points to meet [name]. PT Short Term Goal 4 - Progress (Week 1): Progressing toward goal  Skilled Therapeutic Interventions/Progress Updates:    pt received in bed and agreeable to therapy. Pt reports mild pain in L shin, premedicated. Rest and positioning provided as needed. Pt reports soiled brief d/t urinal spillage. Dependent brief change with mod A rolling R and L. Tot A for hygiene. Increased time as pt is particular with how session progresses. Bed mobiltiy with mod A for trunk elevation and LE management. Sitting balance with mod A fading to close supervision for short bouts while changing gown. D/t short time left in session, pt returned to bed in same manner. Able to scoot to [facility] in trendelenburg position using RLE/RUE. Pt situated in bed with pillows and was left with all needs in reach and alarm active.   Therapy Documentation Precautions:  Precautions Precautions: Fall, Other (comment) Precaution Comments: L hemi Required Braces or Orthoses: Other Brace (Wrist splint and PRAFO) Other Brace: Wrist splint and PRAFO Restrictions Weight Bearing Restrictions: No General:        Therapy/Group: Individual Therapy  Mickel Fuchs 03/21/2022, 12:10 PM

## 2022-03-21 NOTE — Progress Notes (Signed)
Physical Therapy Session Note  Patient Details  Name: Patrick Buchanan MRN: [id number] Date of Birth: [birth year]  Today's Date: 03/21/2022 PT Individual Time: 1402-1530 PT Individual Time Calculation (min): 88 min   Short Term Goals: Week 2:  PT Short Term Goal 1 (Week 2): Patient will improve score on Berg Balance Scale by 7 points to meet [name]. PT Short Term Goal 2 (Week 2): Patient will ambulate >100 feet using LRAD with mod A of 1-2 people. PT Short Term Goal 3 (Week 2): Patient will perform basic transfers with min A >25% of the time. PT Short Term Goal 4 (Week 2): Paitent will perform bed mobility with mod A with or without use of bed rails.  Skilled Therapeutic Interventions/Progress Updates:     Patient in bed upon PT arrival. Patient alert and agreeable to PT session. Patient denied pain during session. Patient reported increased fatigue this afternoon.   Spent increased time to discuss goals of care, manage expectations for recovery, and coping strategies for loss of independence. Discussed challenges with progress at this time and limited motor recovery, decision to shift goals of care to power wheelchair mobility with use of Stedy for transfers to reduce burden of care and increase patient's independence with locomotion, ADLs, and functional mobility. Patient expressed grief over the loss of functional independence and concern for the burden of care placed on his children. Provided therapeutic listening and education on coping strategies. Offered services from neuropsych and PT or rehab team to be present to discuss mobility goals and manage expectations with his family. Patient appreciative for time to discuss and process this information and requested to think about how he would like to discuss this with his family. Educated on possibilities for independence from a power w/c level and continued community access to participate in his family and community life. Plan for community  outing with Rec therapy next week if patient continues to express interest. Also, discussed benefits of pet therapy and opportunities for patient's dog to be brought in to visit him.   Patient requested to remain in the bed at this time. Focused remainder of session on motor imagery with e-stim for assessment of motor activation with volitional control. Able to stimulate 5/5 muscle groups (anterior tib, gastroc, quads, hamstrings, adductors) only able to illicit volitional activation with neuromodulation in anterior tib, quads, and minimal in gastroc. Provided patient with 5-10 min of stimulation with cues for motor imagery of functional movement with each muscle group. Listened to patient selected music during activity for improved affect and engagement in activity.   Patient in bed at end of session with breaks locked, bed alarm set, and all needs within reach.   Therapy Documentation Precautions:  Precautions Precautions: Fall, Other (comment) Precaution Comments: L hemi Required Braces or Orthoses: Other Brace (Wrist splint and PRAFO) Other Brace: Wrist splint and PRAFO Restrictions Weight Bearing Restrictions: No    Therapy/Group: Individual Therapy  Erubiel Manasco L Nareg Breighner PT, DPT  03/21/2022, 9:25 PM

## 2022-03-22 MED ORDER — SORBITOL 70 % SOLN
960.0000 mL | TOPICAL_OIL | Freq: Once | ORAL | Status: AC
  Administered 2022-03-22: 960 mL via RECTAL
  Filled 2022-03-22: qty 473

## 2022-03-22 NOTE — H&P (Incomplete)
SMOG enema effective. During peri cares, redness noted on sacrum. Redness blanchable at this time, barrier cream applied to sacrum. Pt offloaded to side. Educated on pressure wounds. Pt in agreement. Oncoming nurse made aware of skin concern.  Sheela Stack, LPN

## 2022-03-22 NOTE — Progress Notes (Signed)
Occupational Therapy Session Note  Patient Details  Name: Patrick Buchanan MRN: [id number] Date of Birth: 01/13/1951  Today's Date: 03/22/2022 OT Individual Time: 0830-0945 OT Individual Time Calculation (min): 75 min    Short Term Goals: Week 2:  OT Short Term Goal 1 (Week 2): Pt will maintain dynamic sitting balance for 10 min EOB/EOM to improve midline orientation OT Short Term Goal 2 (Week 2): Pt will recall hemi strategies wiht min cuing for UB dressing OT Short Term Goal 3 (Week 2): Pt will complete toilet transfers wiht MOD A consistently OT Short Term Goal 4 (Week 2): pt will use LUE in grooming tasks with MOD A to demo improved L attetnion/funcitonal use  Skilled Therapeutic Interventions/Progress Updates:    Pt received supine with no c/o pain, agreeable to OT session. Pt agreeable to begin session with LUE NMR. 1:1 NMES applied to the subscapularis and posterior deltoid to assist with scapular retraction and more improved alignment/positioning of the humeral head and scapula. Active assist/positioning to bring his scapula into retraction with no activation volitionally.  Ratio 1:3 Rate 35 pps Waveform- Asymmetric Ramp 1.0 Pulse 352 Intensity- 29 Duration - 20 min  Report of pain at the beginning of session 0/10 Report of pain at the end of session 0/10 No adverse reactions after treatment and is skin intact.    Pt then completed LB Adls at bed level with mod A for rolling R and L and to perform peri hygiene. He completed bed mobility to EOB with only mod A to manage LB and then he was able to pivot up into sitting EOB with his RUE. He required min A for UB bathing and donning of gown- to manage his LUE and reach under the RUE. He was able to laterally scoot up the side of bed with mod A. Pt returned to supine and was left with all needs met. Bed alarm set.   [place] Stim One was placed and left unattended on pt's L anterior and middle/posterior deltoid to promote improved  joint approximation and muscle activation. Increased time taken to ensure pt tolerated the intensity (4 clicks) and that he could turn it off himself. No c/o pain or adverse skin reactions.  330 pulse width 35 Hz pulse rate On 8 sec/ off 8 sec Ramp up/ down 2 sec Symmetrical Biphasic wave form  Max intensity 110mA at 500 Ohm load    Therapy Documentation Precautions:  Precautions Precautions: Fall, Other (comment) Precaution Comments: L hemi Required Braces or Orthoses: Other Brace (Wrist splint and PRAFO) Other Brace: Wrist splint and PRAFO Restrictions Weight Bearing Restrictions: No  Therapy/Group: Individual Therapy   H  03/22/2022, 6:34 AM 

## 2022-03-22 NOTE — Progress Notes (Signed)
Physical Therapy Weekly Progress Note  Patient Details  Name: TEDFORD BERG MRN: [id number] Date of Birth: 08/16/1951  Beginning of progress report period: Mar 15, 2022 End of progress report period: Mar 22, 2022  Today's Date: 03/22/2022 PT Individual Time: 1300-1415 PT Individual Time Calculation (min): 75 min   Patient has met 0 of 4 short term goals, partly meeting his bed mobility goal only.  Patient with limited motor recovery to L hemi-body limiting progress with functional mobility and gait. Adjusting goals to reflect transfers with use of Denna Haggard and locomotion using a power w/c. Plan to trial gait training with Bioness FES system, as patient does have a good motor response using NMES. Patient in agreement with these changes in POC and goals. Plan for custom wheelchair evaluation and family education to be set-up for next week.    Patient continues to demonstrate the following deficits muscle weakness and muscle joint tightness, decreased cardiorespiratoy endurance, abnormal tone and decreased coordination, decreased midline orientation and decreased attention to left, and decreased sitting balance, decreased standing balance, decreased postural control, hemiplegia, and decreased balance strategies and therefore will continue to benefit from skilled PT intervention to increase functional independence with mobility.  Patient not progressing toward long term goals.  See goal revision.  Plan of care revisions: see above.  PT Short Term Goals Week 2:  PT Short Term Goal 1 (Week 2): Patient will improve score on Berg Balance Scale by 7 points to meet [name]. PT Short Term Goal 1 - Progress (Week 2): Not progressing PT Short Term Goal 2 (Week 2): Patient will ambulate >100 feet using LRAD with mod A of 1-2 people. PT Short Term Goal 2 - Progress (Week 2): Progressing toward goal PT Short Term Goal 3 (Week 2): Patient will perform basic transfers with min A >25% of the time. PT Short  Term Goal 3 - Progress (Week 2): Not progressing PT Short Term Goal 4 (Week 2): Paitent will perform bed mobility with mod A with or without use of bed rails. PT Short Term Goal 4 - Progress (Week 2): Partly met (able to perform supin to sit with bed rails with mod A, mod-max A for sit to supine) Week 3:  PT Short Term Goal 1 (Week 3): Patient will perform bed mobility with mod A >75% of the time. PT Short Term Goal 2 (Week 3): Patient will perform Stedy transfers with min A >50% of the time. PT Short Term Goal 3 (Week 3): Patient will propel power w/c >200 feet with only min cues for attention to the L. PT Short Term Goal 4 (Week 3): Patient will be independent with power w/c setting with pressure relief and adjustements for comfort.  Skilled Therapeutic Interventions/Progress Updates:     Patient in power w/c in the room upon PT arrival. Patient alert and agreeable to PT session. Patient denied pain during session. Patient does report discomfort in sitting in power w/c for >1 hour despite use of power functions to adjust position. Reached out for custom power w/c evaluation.   Therapeutic Activity: Bed Mobility: Patient performed sit to supine with mod-max A for lower extremity management and min A for trunk control. Provided verbal cues for transitioning through side-lying and bringing knees to chest. Transfers: Patient performed a squat pivot w/c>bed with mod A using bed rail on R and PT facilitating L leg pivot. Provided cues for timing and head-hips relationship. He performed sit to/from stand x1 with mod A +2 in preparation  for therapeutic gait training. Provided verbal cues for forward weight shift, arm placement, and quad/gluteal activation of L to boost up. Wheelchair Mobility:  Patient propelled power wheelchair >200 feet, >150 feet, and >50 feet with supervision. Provided verbal cues for attention to the L, turning technique, and backing up. Patient with poorer steering and attention to  the L when talking during propulsion, educated on dual task challenge of talking during propulsion and instructed patient to focus on the task of propulsion until it becomes more automatic. Patient in agreement. Provided patient with lateral thigh blocks to prevent hip abduction in sitting during session.   Neuromuscular Re-ed: Patient performed therapeutic gait training for improved lower extremity motor control with a familiar and automatic functional task: Patient ambulated 40 feet using 3 Musketeer technique with mod-max A +2 and mod A for L limb advancement and stabilization in stance with NMES to quads throughout. Ambulated with increased trunk flexion, decreased L weight shift, decreased  L hamstring activation in pre-swing, partial DF activation, increased L gluteal and quad activation in stance consistently with NMES running, and increased B knee flexion with fatigue. Provided max multimodal cues for erect posture, looking ahead, sequencing, attention to task, and L muscle activation.  Patient in bed, required increased time for positioning, at end of session with breaks locked, bed alarm set, and all needs within reach.   Therapy Documentation Precautions:  Precautions Precautions: Fall, Other (comment) Precaution Comments: L hemi Required Braces or Orthoses: Other Brace (Wrist splint and PRAFO) Other Brace: Wrist splint and PRAFO Restrictions Weight Bearing Restrictions: No   Therapy/Group: Individual Therapy  Tameka Hoiland L Teleah Villamar PT, DPT  03/22/2022, 6:23 PM

## 2022-03-22 NOTE — Progress Notes (Signed)
PROGRESS NOTE   Subjective/Complaints:  Complaining about night staff again. Told me he waited 3 hours for someone to come in and place his splint. Otherwise no new issues. Had questions about his blood count. Says he did not receive enema yesterday  ROS: Patient denies fever, rash, sore throat, blurred vision, dizziness, nausea, vomiting, diarrhea, cough, shortness of breath or chest pain, joint or back/neck pain, headache, or mood change.    Objective:   No results found. Recent Labs    03/21/22 0520  WBC 4.9  HGB 10.4*  HCT 31.6*  PLT 128*     Recent Labs    03/21/22 0520  NA 139  K 4.1  CL 103  CO2 30  GLUCOSE 115*  BUN 15  CREATININE 0.77  CALCIUM 8.4*      Intake/Output Summary (Last 24 hours) at 03/22/2022 0846 Last data filed at 03/22/2022 0000 Gross per 24 hour  Intake 480 ml  Output 400 ml  Net 80 ml        Physical Exam: Vital Signs Blood pressure 116/73, pulse 66, temperature 98 F (36.7 C), temperature source Oral, resp. rate 19, height 6' (1.829 m), weight 92.4 kg, SpO2 97 %. Constitutional: No distress . Vital signs reviewed. HEENT: NCAT, EOMI, oral membranes moist Neck: supple Cardiovascular: RRR without murmur. No JVD    Respiratory/Chest: CTA Bilaterally without wheezes or rales. Normal effort    GI/Abdomen: BS +, non-tender, non-distended Ext: no clubbing, cyanosis, or edema Psych: pleasant and cooperative  Skin: Clean and intact without signs of breakdown, scalp remains incision cdi Neuro:  Alert and oriented x 3. Fair insight and awareness. Intact Memory. Normal language and speech. CN II-XII grossly intact.  LUE 0 shoulder, bicep, tricep, 3+/5 wrist and 3+/5 finger flexors. RUE 4-5/5. LLE with 1 to 1+ /5 HE, KE and trace at foot. RLE 4/5. Minimal extensor tone LLE and  minimal  flexor tone in LUE. Sensory: intact to LT and pain in all 4 extremities. DTR's 3+ LLE, 2+  LUE. Musculoskeletal: heel cord flexible. Left shoulder subluxed 1/2"--really no change. Definite proximal muscle atrophy     Assessment/Plan: 1. Functional deficits which require 3+ hours per day of interdisciplinary therapy in a comprehensive inpatient rehab setting. Physiatrist is providing close team supervision and 24 hour management of active medical problems listed below. Physiatrist and rehab team continue to assess barriers to discharge/monitor patient progress toward functional and medical goals  Care Tool:  Bathing    Body parts bathed by patient: Left arm, Chest, Abdomen, Front perineal area, Right upper leg, Left upper leg, Face   Body parts bathed by helper: Right arm, Buttocks, Right lower leg, Left lower leg     Bathing assist Assist Level: Maximal Assistance - Patient 24 - 49%     Upper Body Dressing/Undressing Upper body dressing   What is the patient wearing?: Pull over shirt    Upper body assist Assist Level: Moderate Assistance - Patient 50 - 74%    Lower Body Dressing/Undressing Lower body dressing      What is the patient wearing?: Pants     Lower body assist Assist for lower body dressing: Total Assistance -  Patient < 25%     Toileting Toileting    Toileting assist Assist for toileting: Total Assistance - Patient < 25% Assistive Device Comment: urinal   Transfers Chair/bed transfer  Transfers assist     Chair/bed transfer assist level: Moderate Assistance - Patient 50 - 74%     Locomotion Ambulation   Ambulation assist      Assist level: 2 helpers Assistive device: Other (comment) (3 Musketeer) Max distance: 60 ft   Walk 10 feet activity   Assist  Walk 10 feet activity did not occur: Safety/medical concerns  Assist level: Moderate Assistance - Patient - 50 - 74% Assistive device: Other (comment) (R rail)   Walk 50 feet activity   Assist Walk 50 feet with 2 turns activity did not occur: Safety/medical  concerns  Assist level: 2 helpers Assistive device: Other (comment) (3 Musketeer)    Walk 150 feet activity   Assist Walk 150 feet activity did not occur: Safety/medical concerns         Walk 10 feet on uneven surface  activity   Assist Walk 10 feet on uneven surfaces activity did not occur: Safety/medical concerns         Wheelchair     Assist Is the patient using a wheelchair?: Yes Type of Wheelchair: Manual    Wheelchair assist level: Dependent - Patient 0%      Wheelchair 50 feet with 2 turns activity    Assist        Assist Level: Dependent - Patient 0%   Wheelchair 150 feet activity     Assist      Assist Level: Dependent - Patient 0%   Blood pressure 116/73, pulse 66, temperature 98 F (36.7 C), temperature source Oral, resp. rate 19, height 6' (1.829 m), weight 92.4 kg, SpO2 97 %.  Medical Problem List and Plan: 1. 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             -patient may shower               -ELOS/Goals: 03/30/22           -Continue CIR therapies including PT, OT  -spoke to nursing about pt complaint--they are following up 2.  Antithrombotics: -DVT/anticoagulation:  pt with left posterior tib dvt.  -  lovenox '40mg'$  q12 -re-doppler 5/24 demonstrated persistent left posterior tib dvt -re-image prior to discharge to determine a/c need -activity as tolerated             -antiplatelet therapy: none 3. Pain Management: Tylenol, hydrocodone, robaxin as needed             - Gabapentin '100mg'$  BID   -elevation/support of left shoulder for sublux. Harness?   -estim per OT 4. Mood: team providing emotional support             -antipsychotic agents: n/a             --Depression continue Lexapro 20 mg daily   5. Neuropsych: This patient is capable of making decisions on his own behalf. 6. Skin/Wound Care: Routine skin care checks             -- Monitor surgical  incision 7. Fluids/Electrolytes/Nutrition:    - continue to push fluids  -protein supp for low albumin 9: History of multiple meningiomas/s/p resection:             -- Continue Keppra and Lamictal             --  Calcium carbonate tablets 3 times daily   -continue above, off steroids now 10: Dyslipidemia: Continue Crestor 20 mg daily 11: History of GERD: Continue Protonix 40 mg daily 12: DM-2: diet controlled (A1c = 5.7 on 02/22/2022) 13: Anemia/acute intra-op blood loss: follow up hgb a little lower to 10.4 but he has fluctuated between 10-12 during this admission--repeat Friday 14. Constipation: some results 5/17, none since-  -5/26 --last bm   - continue  magnesium gluconate '250mg'$  HS  - senna-s bid, miralax bid as mtc regimen  - added  MOM 30cc at bedtime per home regimen to above  -5/31 did not receive SMOG enema yesterday--re-ordered   -increased senna to 2 tabs bid 5/30   -consider linzess trial 15. Spasticity:   night splint for LLE, tolerating  -continue trial of baclofen '10mg'$  tid  -splinting, rom with therapies   -improved -  16. Hypertension:  controlled 5/26 -continue magnesium gluconate '250mg'$  HS    03/22/2022    6:56 AM 03/21/2022    7:21 PM 03/21/2022    1:28 PM  Vitals with BMI  Systolic 588 325 498  Diastolic 73 74 69  Pulse 66 81 80   5/29 Continue magnesium gluconate 250 mg qHS.    LOS: 15 days A FACE TO FACE EVALUATION WAS PERFORMED  Meredith Staggers 03/22/2022, 8:46 AM

## 2022-03-22 NOTE — Plan of Care (Signed)
Problem: RH Car Transfers Goal: LTG Patient will perform car transfers with assist (PT) Description: LTG: Patient will perform car transfers with assistance (PT). Outcome: Not Applicable Flowsheets (Taken 03/22/2022 1658) LTG: Pt will perform car transfers with assist:: (d/c goal due to need for medical transport due to limited motor recovery) --   Problem: RH Ambulation Goal: LTG Patient will ambulate in home environment (PT) Description: LTG: Patient will ambulate in home environment, # of feet with assistance (PT). Outcome: Not Applicable Flowsheets (Taken 03/22/2022 1658) LTG: Pt will ambulate in home environ  assist needed:: (d/c goal due to lack of progress) --   Problem: RH Stairs Goal: LTG Patient will ambulate up and down stairs w/assist (PT) Description: LTG: Patient will ambulate up and down # of stairs with assistance (PT) Outcome: Not Applicable Flowsheets (Taken 03/22/2022 1658) LTG: Pt will ambulate up/down stairs assist needed:: (d/c goal due to ramp placement and plance for patien to d/c w/c level) --   Problem: RH Balance Goal: LTG Patient will maintain dynamic sitting balance (PT) Description: LTG:  Patient will maintain dynamic sitting balance with assistance during mobility activities (PT) Flowsheets (Taken 03/22/2022 1658) LTG: Pt will maintain dynamic sitting balance during mobility activities with:: (downgraded to to slow progress and limited motor recovery) Contact Guard/Touching assist Goal: LTG Patient will maintain dynamic standing balance (PT) Description: LTG:  Patient will maintain dynamic standing balance with assistance during mobility activities (PT) Flowsheets (Taken 03/22/2022 1658) LTG: Pt will maintain dynamic standing balance during mobility activities with:: (in Denna Haggard for functional transfers and ADLs.) Minimal Assistance - Patient > 75% Note: downgraded to to slow progress and limited motor recovery   Problem: Sit to Stand Goal: LTG:   Patient will perform sit to stand with assistance level (PT) Description: LTG:  Patient will perform sit to stand with assistance level (PT) Flowsheets (Taken 03/22/2022 1658) LTG: PT will perform sit to stand in preparation for functional mobility with assistance level: (downgraded to to slow progress and limited motor recovery) Moderate Assistance - Patient 50 - 74%   Problem: RH Bed Mobility Goal: LTG Patient will perform bed mobility with assist (PT) Description: LTG: Patient will perform bed mobility with assistance, with/without cues (PT). Flowsheets (Taken 03/22/2022 1658) LTG: Pt will perform bed mobility with assistance level of: (downgraded to to slow progress and limited motor recovery) Moderate Assistance - Patient 50 - 74%   Problem: RH Bed to Chair Transfers Goal: LTG Patient will perform bed/chair transfers w/assist (PT) Description: LTG: Patient will perform bed to chair transfers with assistance (PT). Flowsheets (Taken 03/22/2022 1658) LTG: Pt will perform Bed to Chair Transfers with assistance level: (using Denna Haggard) Minimal Assistance - Patient > 75% Note: downgraded to to slow progress and limited motor recovery   Problem: RH Ambulation Goal: LTG Patient will ambulate in controlled environment (PT) Description: LTG: Patient will ambulate in a controlled environment, # of feet with assistance (PT). Flowsheets (Taken 03/22/2022 1658) LTG: Pt will ambulate in controlled environ  assist needed:: (+2 using LRAD for therapeutic gait training) Moderate Assistance - Patient 50 - 74% LTG: Ambulation distance in controlled environment: >75 ft Note: downgraded to to slow progress and limited motor recovery   Problem: RH Wheelchair Mobility Goal: LTG Patient will propel w/c in controlled environment (PT) Description: LTG: Patient will propel wheelchair in controlled environment, # of feet with assist (PT) Flowsheets (Taken 03/22/2022 1658) LTG: Pt will propel w/c in controlled  environ  assist needed:: (with power wheelchair) --  LTG: Propel w/c distance in controlled environment: >200 ft

## 2022-03-22 NOTE — Progress Notes (Signed)
Occupational Therapy Session Note  Patient Details  Name: Patrick Buchanan MRN: [id number] Date of Birth: [birth year]  Today's Date: 03/22/2022 OT Individual Time: 1045-1200 OT Individual Time Calculation (min): 75 min    Short Term Goals: Week 1:  OT Short Term Goal 1 (Week 1): Pt will sit dynamically at EOB or EOM for 10 min with no more than CGA for balance corrections OT Short Term Goal 1 - Progress (Week 1): Progressing toward goal OT Short Term Goal 2 (Week 1): Pt will complete toilet transfer with MAX A of 1 and LRAD OT Short Term Goal 2 - Progress (Week 1): Met OT Short Term Goal 3 (Week 1): Pt will sit to stand with MOD A Overall in prep for LB and toileting tasks OT Short Term Goal 3 - Progress (Week 1): Met OT Short Term Goal 4 (Week 1): Pt will recall hemi strategies with MIN cuing OT Short Term Goal 4 - Progress (Week 1): Progressing toward goal  Skilled Therapeutic Interventions/Progress Updates:     Pt received in bed with pain.  Pt completes ADL at overall MIN A level for shirt and MAX A level for EOB using stedy Level. Skilled interventions include: cuing for attention to LUE, MIN A for sitting balance at EOB during UB dresing, MOD A for reaching towards floor seated to pull pants past knees. Pt able ot sit to stand in stedy with MIN A and tactile cues to increase terminal hip extension and Vc for weight shifting to L for EOB>PWC<>EOM. Remainder of session focus on quad and hamstring activation with roller board under LLE with visible activation of quad able to move roller board, but no activation of hamstring. Tapping and VC for visual fixation used to improve LLE attention.   Pt left at end of session in [place] with exit alarm on, call light in reach and all needs met   Therapy Documentation Precautions:  Precautions Precautions: Fall, Other (comment) Precaution Comments: L hemi Required Braces or Orthoses: Other Brace (Wrist splint and PRAFO) Other Brace: Wrist  splint and PRAFO Restrictions Weight Bearing Restrictions: No General:     Therapy/Group: Individual Therapy  [name] 03/22/2022, 6:56 AM

## 2022-03-22 NOTE — Progress Notes (Signed)
SMOG enema initiated. Pt required manual disimpaction. Medium hard stool removed from rectum.  Sheela Stack, LPN

## 2022-03-23 ENCOUNTER — Inpatient Hospital Stay (HOSPITAL_COMMUNITY): Payer: Medicare HMO

## 2022-03-23 MED ORDER — SENNOSIDES-DOCUSATE SODIUM 8.6-50 MG PO TABS
2.0000 | ORAL_TABLET | Freq: Every day | ORAL | Status: DC
  Administered 2022-03-24 – 2022-03-26 (×3): 2 via ORAL
  Filled 2022-03-23 (×3): qty 2

## 2022-03-23 MED ORDER — LINACLOTIDE 145 MCG PO CAPS
145.0000 ug | ORAL_CAPSULE | Freq: Every day | ORAL | Status: DC
  Administered 2022-03-23 – 2022-03-30 (×8): 145 ug via ORAL
  Filled 2022-03-23 (×8): qty 1

## 2022-03-23 MED ORDER — SORBITOL 70 % SOLN
960.0000 mL | TOPICAL_OIL | Freq: Once | ORAL | Status: AC
  Administered 2022-03-23: 960 mL via RECTAL
  Filled 2022-03-23: qty 473

## 2022-03-23 NOTE — Plan of Care (Signed)
  Problem: RH Balance Goal: LTG: Patient will maintain dynamic sitting balance (OT) Description: LTG:  Patient will maintain dynamic sitting balance with assistance during activities of daily living (OT) Flowsheets (Taken 03/23/2022 0658) LTG: Pt will maintain dynamic sitting balance during ADLs with: Minimal Assistance - Patient > 75% Note: Downgraded d/t progress SMS 6/1   Problem: Sit to Stand Goal: LTG:  Patient will perform sit to stand in prep for activites of daily living with assistance level (OT) Description: LTG:  Patient will perform sit to stand in prep for activites of daily living with assistance level (OT) Flowsheets (Taken 03/23/2022 0658) LTG: PT will perform sit to stand in prep for activites of daily living with assistance level: (in stedy to decrease BOC for clothing mamangement for LB dressing and toileting) Minimal Assistance - Patient > 75% Note: Downgraded d/t progress SMS 6/1   Problem: RH Bathing Goal: LTG Patient will bathe all body parts with assist levels (OT) Description: LTG: Patient will bathe all body parts with assist levels (OT) Flowsheets (Taken 03/23/2022 0658) LTG: Pt will perform bathing with assistance level/cueing: Moderate Assistance - Patient 50 - 74% Note: Downgraded d/t progress SMS 6/1   Problem: RH Dressing Goal: LTG Patient will perform upper body dressing (OT) Description: LTG Patient will perform upper body dressing with assist, with/without cues (OT). Flowsheets (Taken 03/23/2022 [phone]) LTG: Pt will perform upper body dressing with assistance level of: Contact Guard/Touching assist Note: Downgraded d/t progress SMS 6/1 Goal: LTG Patient will perform lower body dressing w/assist (OT) Description: LTG: Patient will perform lower body dressing with assist, with/without cues in positioning using equipment (OT) Outcome: Not Applicable Note: Discontinued goal d/t progress and to reflect decreased BOC with stedy use for LB dressing/toileting    Problem: RH Toileting Goal: LTG Patient will perform toileting task (3/3 steps) with assistance level (OT) Description: LTG: Patient will perform toileting task (3/3 steps) with assistance level (OT)  Outcome: Not Applicable Note: Discontinued to reflect use of stedy to decrease BOC for toileting   Problem: RH Toilet Transfers Goal: LTG Patient will perform toilet transfers w/assist (OT) Description: LTG: Patient will perform toilet transfers with assist, with/without cues using equipment (OT) Flowsheets (Taken 03/23/2022 0658) LTG: Pt will perform toilet transfers with assistance level of: (in stedy) Minimal Assistance - Patient > 75%   Problem: RH Tub/Shower Transfers Goal: LTG Patient will perform tub/shower transfers w/assist (OT) Description: LTG: Patient will perform tub/shower transfers with assist, with/without cues using equipment (OT) Flowsheets (Taken 03/23/2022 0658) LTG: Pt will perform tub/shower stall transfers with assistance level of: Moderate Assistance - Patient 50 - 74% Note: Downgraded d/t progress SMS 6/1

## 2022-03-23 NOTE — Progress Notes (Signed)
Physical Therapy Session Note  Patient Details  Name: Patrick Buchanan MRN: [id number] Date of Birth: 1951-09-13  Today's Date: 03/23/2022 PT Individual Time: 0830-0930 and 1420-1530 PT Individual Time Calculation (min): 60 min and 70 min  Short Term Goals: Week 3:  PT Short Term Goal 1 (Week 3): Patient will perform bed mobility with mod A >75% of the time. PT Short Term Goal 2 (Week 3): Patient will perform Stedy transfers with min A >50% of the time. PT Short Term Goal 3 (Week 3): Patient will propel power w/c >200 feet with only min cues for attention to the L. PT Short Term Goal 4 (Week 3): Patient will be independent with power w/c setting with pressure relief and adjustements for comfort.  Skilled Therapeutic Interventions/Progress Updates:     Session 1: Patient in bed upon PT arrival. Patient alert and agreeable to PT session. Patient denied pain during session.  Focused session on w/c mat assessment for w/c evaluation on Monday. Discussion of power w/c accessibility and community integration, 24/7 assist at d/c, [facility] with use of Bioness system, assessment with device scheduled for Monday.   Applied NMES to quads x10 min and hamstrings x10 min with clear muscle contraction with flexor synergy incorporating DF with hamstring stimulation Ratio 1:3 Rate 35 pps Waveform- Asymmetric Ramp 1.0 Pulse 352 Intensity- 33 Report of pain at the beginning of session 0/10 Report of pain at the end of session 0/10 No adverse reactions after treatment and is skin intact.   Patient in bed at end of session with breaks locked, bed alarm set, and all needs within reach.   Session 2: Patient in bed upon PT arrival, nursing reporting enema administration at noon and multiple BMs prior to session with challenges motivating patient to get to the [facility]. Patient alert and agreeable to PT session. Patient denied pain during session.  Educated patient on benefits of getting to the [facility] for BMs.  Patient reluctant due to concerns about incontinence and reporting feeling "empty" at this time. Educated on the benefits of the body being vertical and mobility to promote motility and limitations for motility in the bed. Patient agreeable to attempt BM on BSC.  Therapeutic Activity: Bed Mobility: performed supine to/from sit with mod A. Provided verbal cues and facilitation for L hemi-body management, sequencing, progressing through side-lying, and use of R upper extremity for trunk control with use of the bed rail. Transfers: Patient performed a dependent transfer bed<>BSC using the [facility], performed sit to/from stand x 4 with min-mod A in the [employer]. Provided verbal cues for midline orientation, postural control, facilitation for L hand and foot placement, and L quad and gluteal activation to boost up and eccentric control on descent. Patient was continent of bowl, with increased time, >20 min on BSC. Performed peri-care and lower body clothing management with total A during toileting.   As patient toileted, performed L shoulder ROM with humeral approximation and facilitation for scapular motion. Patient unable to activate scapular muscles with facilitation. Noted his shoulder has progressed to 1.5 finger subluxation with anterior bias. Educated patient on importance of positioning to prevent further subluxation due to muscle atrophy.   Patient in bed, with increased time for positioning, at end of session with breaks locked, bed alarm set, and all needs within reach.    Therapy Documentation Precautions:  Precautions Precautions: Fall, Other (comment) Precaution Comments: L hemi Required Braces or Orthoses: Other Brace (Wrist splint and PRAFO) Other Brace: Wrist splint and PRAFO Restrictions Weight  Bearing Restrictions: No    Therapy/Group: Individual Therapy  Shevawn Langenberg L Vickii Volland PT, DPT  03/23/2022, 12:54 PM

## 2022-03-23 NOTE — Progress Notes (Signed)
PROGRESS NOTE   Subjective/Complaints:  Had a pretty good night. Nurse had to disimpact him of hard stool with SMOG enema yesterday followed by liquid stools.Marland Kitchen He feels better.   ROS: Patient denies fever, rash, sore throat, blurred vision, dizziness, nausea, vomiting, diarrhea, cough, shortness of breath or chest pain, joint or back/neck pain, headache, or mood change.    Objective:   No results found. Recent Labs    03/21/22 0520  WBC 4.9  HGB 10.4*  HCT 31.6*  PLT 128*     Recent Labs    03/21/22 0520  NA 139  K 4.1  CL 103  CO2 30  GLUCOSE 115*  BUN 15  CREATININE 0.77  CALCIUM 8.4*      Intake/Output Summary (Last 24 hours) at 03/23/2022 1054 Last data filed at 03/23/2022 0803 Gross per 24 hour  Intake 720 ml  Output 500 ml  Net 220 ml        Physical Exam: Vital Signs Blood pressure 111/72, pulse 63, temperature 97.7 F (36.5 C), resp. rate 18, height 6' (1.829 m), weight 92.4 kg, SpO2 98 %. Constitutional: No distress . Vital signs reviewed. HEENT: NCAT, EOMI, oral membranes moist Neck: supple Cardiovascular: RRR without murmur. No JVD    Respiratory/Chest: CTA Bilaterally without wheezes or rales. Normal effort    GI/Abdomen: BS +, non-tender, non-distended, soft Ext: no clubbing, cyanosis, or edema Psych: pleasant and cooperative  Skin: Clean and intact without signs of breakdown, scalp remains incision cdi Neuro:  Alert and oriented x 3. Fair insight and awareness. Intact Memory. Normal language and speech. CN II-XII grossly intact.  LUE 0 shoulder, bicep, tricep, 3+/5 wrist and 3+/5 finger flexors. RUE 4-5/5. LLE with 1 to 1+ /5 HE, KE and trace at foot. RLE 4/5. Minimal extensor tone LLE and  minimal  flexor tone in LUE. Sensory: intact to LT and pain in all 4 extremities. DTR's 3+ LLE, 2+ LUE. Musculoskeletal: heel cord flexible. Left shoulder subluxed 1/2"--really no change. LUE proximal  muscle atrophy     Assessment/Plan: 1. Functional deficits which require 3+ hours per day of interdisciplinary therapy in a comprehensive inpatient rehab setting. Physiatrist is providing close team supervision and 24 hour management of active medical problems listed below. Physiatrist and rehab team continue to assess barriers to discharge/monitor patient progress toward functional and medical goals  Care Tool:  Bathing    Body parts bathed by patient: Left arm, Chest, Abdomen, Front perineal area, Right upper leg, Left upper leg, Face   Body parts bathed by helper: Right arm, Buttocks, Right lower leg, Left lower leg     Bathing assist Assist Level: Maximal Assistance - Patient 24 - 49%     Upper Body Dressing/Undressing Upper body dressing   What is the patient wearing?: Pull over shirt    Upper body assist Assist Level: Moderate Assistance - Patient 50 - 74%    Lower Body Dressing/Undressing Lower body dressing      What is the patient wearing?: Pants     Lower body assist Assist for lower body dressing: Total Assistance - Patient < 25%     Chartered loss adjuster  assist Assist for toileting: Total Assistance - Patient < 25% Assistive Device Comment: urinal   Transfers Chair/bed transfer  Transfers assist     Chair/bed transfer assist level: Moderate Assistance - Patient 50 - 74%     Locomotion Ambulation   Ambulation assist      Assist level: 2 helpers Assistive device: Other (comment) (3 Musketeer) Max distance: 60 ft   Walk 10 feet activity   Assist  Walk 10 feet activity did not occur: Safety/medical concerns  Assist level: Moderate Assistance - Patient - 50 - 74% Assistive device: Other (comment) (R rail)   Walk 50 feet activity   Assist Walk 50 feet with 2 turns activity did not occur: Safety/medical concerns  Assist level: 2 helpers Assistive device: Other (comment) (3 Musketeer)    Walk 150 feet  activity   Assist Walk 150 feet activity did not occur: Safety/medical concerns         Walk 10 feet on uneven surface  activity   Assist Walk 10 feet on uneven surfaces activity did not occur: Safety/medical concerns         Wheelchair     Assist Is the patient using a wheelchair?: Yes Type of Wheelchair: Manual    Wheelchair assist level: Dependent - Patient 0%      Wheelchair 50 feet with 2 turns activity    Assist        Assist Level: Dependent - Patient 0%   Wheelchair 150 feet activity     Assist      Assist Level: Dependent - Patient 0%   Blood pressure 111/72, pulse 63, temperature 97.7 F (36.5 C), resp. rate 18, height 6' (1.829 m), weight 92.4 kg, SpO2 98 %.  Medical Problem List and Plan: 1. 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             -patient may shower               -ELOS/Goals: 03/30/22           -Continue CIR therapies including PT, OT  2.  Antithrombotics: -DVT/anticoagulation:  pt with left posterior tib dvt.  -  lovenox '40mg'$  q12 -re-doppler 5/24 demonstrated persistent left posterior tib dvt -re-image Monday  -activity as tolerated             -antiplatelet therapy: none 3. Pain Management: Tylenol, hydrocodone, robaxin as needed             - Gabapentin '100mg'$  BID   -elevation/support of left shoulder for sublux. Harness?   -estim per OT 4. Mood: team providing emotional support             -antipsychotic agents: n/a             --Depression continue Lexapro 20 mg daily   5. Neuropsych: This patient is capable of making decisions on his own behalf. 6. Skin/Wound Care: Routine skin care checks             -- Monitor surgical incision 7. Fluids/Electrolytes/Nutrition:    - continue to push fluids  -protein supp for low albumin 9: History of multiple meningiomas/s/p resection:             -- Continue Keppra and Lamictal             -- Calcium  carbonate tablets 3 times daily   -continue above, off steroids currently 10: Dyslipidemia: Continue Crestor 20 mg daily 11: History  of GERD: Continue Protonix 40 mg daily 12: DM-2: diet controlled (A1c = 5.7 on 02/22/2022) 13: Anemia/acute intra-op blood loss: follow up hgb a little lower to 10.4 but he has fluctuated between 10-12 during this admission--repeat Friday 14. Constipation: some results 5/17, none since-  -5/26 --last bm   - continue  magnesium gluconate '250mg'$  HS  - senna-s bid, miralax bid as mtc regimen  - added  MOM 30cc at bedtime per home regimen to above  -6/1 results with SMOG enema yesterday -repeat today -begin linzess trial, stop MOM and miralax 15. Spasticity:   night splint for LLE, tolerating  -continue trial of baclofen '10mg'$  tid  -splinting, rom with therapies   -improved -  16. Hypertension:  controlled 5/26 -continue magnesium gluconate '250mg'$  HS    03/23/2022    4:58 AM 03/22/2022    7:21 PM 03/22/2022    6:56 AM  Vitals with BMI  Systolic 827 078 675  Diastolic 72 75 73  Pulse 63 83 66   6/1 controlled    LOS: 16 days A FACE TO FACE EVALUATION WAS PERFORMED  Meredith Staggers 03/23/2022, 10:54 AM

## 2022-03-23 NOTE — Progress Notes (Signed)
Notified Risa Grill, PA of X-ray results. No new orders.   Yehuda Mao, LPN

## 2022-03-23 NOTE — Progress Notes (Signed)
Occupational Therapy Weekly Progress Note  Patient Details  Name: Patrick Buchanan MRN: [id number] Date of Birth: 13-Mar-1951  Beginning of progress report period: Mar 16, 2022 End of progress report period: March 23, 2022  Today's Date: 03/23/2022 OT Individual Time: 1005-1100 OT Individual Time Calculation (min): 55 min    Patient has met 2 of 4 short term goals.  Pt remains making slow progress towards functional goals in OT. Pt has improved static sitting balance to supervision with ability to self innitiate postural corrections however can vary up to MOD A dynamically sitting at EOB during BADL tasks. Pt can don a shirt with MIN A at EOB and recall hemi strategies. Pt continues to have increased subluxation of L shoulder despite positioning/NMES trials with no return noted. Pt goals have been downgraded to reflect use of a stedy with caregivers at home to decrease BOC and increase safety with functional transfers and ADLS  Patient continues to demonstrate the following deficits: muscle weakness, decreased cardiorespiratoy endurance, impaired timing and sequencing, abnormal tone, unbalanced muscle activation, decreased coordination, and decreased motor planning, decreased midline orientation and decreased attention to left, decreased attention, decreased awareness, decreased problem solving, decreased safety awareness, decreased memory, and delayed processing, and decreased sitting balance, decreased standing balance, decreased postural control, hemiplegia, and decreased balance strategies and therefore will continue to benefit from skilled OT intervention to enhance overall performance with BADL.  Patient not progressing toward long term goals.  See goal revision..  Plan of care revisions: min  A sit to stand in stedy, CGA UB dressing, DC toileting/LB dressing, MIN A stedy toilet transfer.  OT Short Term Goals Week 2:  OT Short Term Goal 1 (Week 2): Pt will maintain dynamic sitting balance for  10 min EOB/EOM to improve midline orientation OT Short Term Goal 1 - Progress (Week 2): Progressing toward goal OT Short Term Goal 2 (Week 2): Pt will recall hemi strategies wiht min cuing for UB dressing OT Short Term Goal 2 - Progress (Week 2): Met OT Short Term Goal 3 (Week 2): Pt will complete toilet transfers wiht MOD A consistently OT Short Term Goal 3 - Progress (Week 2): Progressing toward goal OT Short Term Goal 4 (Week 2): pt will use LUE in grooming tasks with MOD A to demo improved L attetnion/funcitonal use OT Short Term Goal 4 - Progress (Week 2): Met Week 3:  OT Short Term Goal 1 (Week 3): STG=LTG d/t ELOS  Skilled Therapeutic Interventions/Progress Updates:    1:1. Pt received in bed agreeable to OT and getting OOB. Pt declines donning new shirt but agreeable to put on regular pants. Pt sup>sit with MAX A overall and sits EOB with min-MOD A dynamically to thread LES into pants with MAX A. Pt reporting need to toilet and transfers via stedy with min-MOD A for hip extension facilitation and midline orientation. Pt unsuccessful with BM on toilet. Pt dependent for clothing mnagmnet. Pt grooms at sink seated in [facility] with increased time for pt to problem solve UE use without OT A. Pt completes 3 STS in stedy with mirror for feedback in dayroom with improve hip and knee extension but unable to maintain >3 seconds. Pt returned to bed in stedy with same A as above. Pt encouraged to plug in own phones to power cords as this is on R side and pt able to reach to improve agency after setting up pt with pillows.   Therapy Documentation Precautions:  Precautions Precautions: Fall, Other (comment) Precaution  Comments: L hemi Required Braces or Orthoses: Other Brace (Wrist splint and PRAFO) Other Brace: Wrist splint and PRAFO Restrictions Weight Bearing Restrictions: No General:      Therapy/Group: Individual Therapy  [name] 03/23/2022, 6:56 AM

## 2022-03-24 LAB — CBC
HCT: 32.5 % — ABNORMAL LOW (ref 39.0–52.0)
Hemoglobin: 10.4 g/dL — ABNORMAL LOW (ref 13.0–17.0)
MCH: 30.8 pg (ref 26.0–34.0)
MCHC: 32 g/dL (ref 30.0–36.0)
MCV: 96.2 fL (ref 80.0–100.0)
Platelets: 162 10*3/uL (ref 150–400)
RBC: 3.38 MIL/uL — ABNORMAL LOW (ref 4.22–5.81)
RDW: 15.1 % (ref 11.5–15.5)
WBC: 4.7 10*3/uL (ref 4.0–10.5)
nRBC: 0 % (ref 0.0–0.2)

## 2022-03-24 NOTE — Progress Notes (Signed)
Patient ID: Patrick Buchanan, male   DOB: 09/14/1951, 71 y.o.   MRN: [id number]  SW received phone call from [name] reporting preferred [facility] is [facility]. SW explained the difference between home health and home care agencies. She reports pt LTC policy states they cannot provide any care or any DME needed until they come to the home to assess. SW sent her a sitter list to review so she can work on putting another provider in place. Also intends to purchase a stedy. SW provided [employer] options for ramp and stedy items. SW will schedule ambulance transportation to home. Family will keep working on trying to put in place home care staff. SW emailed pt dtr a Advertising copywriter.   SW sent HHPT/OT/aide referral to [facility] and waiting on follow-up.   SW scheduled ambulance transportation with [facility] [phone]) to home for Thursday,June 8 at [place], MSW, [place] Office: [phone] Cell: [phone] Fax: [phone]

## 2022-03-24 NOTE — Progress Notes (Addendum)
PROGRESS NOTE   Subjective/Complaints:  Had 4 bm's with enema yesterday. Passing some gas today. Reviewed results of his KUB. Says he feels better  ROS: Patient denies fever, rash, sore throat, blurred vision, dizziness, nausea, vomiting, diarrhea, cough, shortness of breath or chest pain,  back/neck pain, headache, or mood change.    Objective:   DG Abd 1 View  Result Date: 03/23/2022 CLINICAL DATA:  Constipation EXAM: ABDOMEN - 1 VIEW COMPARISON:  None Available. FINDINGS: Bowel gas pattern is nonspecific. No abnormal masses or calcifications are seen. Kidneys are partly obscured by bowel contents. There is no evidence of any significant amount of stool in the colon. Degenerative changes are noted in the lumbar spine. IMPRESSION: Nonspecific bowel gas pattern. There is no evidence of any significant amount of stool in colon. Electronically Signed   By: Elmer Picker M.D.   On: 03/23/2022 11:54   Recent Labs    03/24/22 0548  WBC 4.7  HGB 10.4*  HCT 32.5*  PLT 162     No results for input(s): NA, K, CL, CO2, GLUCOSE, BUN, CREATININE, CALCIUM in the last 72 hours.     Intake/Output Summary (Last 24 hours) at 03/24/2022 1108 Last data filed at 03/24/2022 0930 Gross per 24 hour  Intake 570 ml  Output 800 ml  Net -230 ml        Physical Exam: Vital Signs Blood pressure 104/65, pulse 62, temperature 97.6 F (36.4 C), temperature source Oral, resp. rate 18, height 6' (1.829 m), weight 92.4 kg, SpO2 96 %. Constitutional: No distress . Vital signs reviewed. HEENT: NCAT, EOMI, oral membranes moist Neck: supple Cardiovascular: RRR without murmur. No JVD    Respiratory/Chest: CTA Bilaterally without wheezes or rales. Normal effort    GI/Abdomen: BS +, non-tender, non-distended Ext: no clubbing, cyanosis, or edema Psych: pleasant and cooperative  Skin: Clean and intact without signs of breakdown, scalp remains incision  cdi Neuro:  Alert and oriented x 3. Fair insight and awareness. Intact Memory. Normal language and speech. CN II-XII grossly intact.  LUE 0 shoulder, bicep, tricep, 3+/5 wrist and 3+/5 finger flexors. RUE 4-5/5. LLE with 1 to 1+ /5 HE, KE and trace at foot. RLE 4/5. Minimal extensor tone LLE and  minimal  flexor tone in LUE. Sensory: intact to LT and pain in all 4 extremities. DTR's 3+ LLE, 2+ LUE. Musculoskeletal: heel cord flexible to neutral. Left shoulder subluxed 1/2"--really stable in appearance.. LUE proximal muscle atrophy     Assessment/Plan: 1. Functional deficits which require 3+ hours per day of interdisciplinary therapy in a comprehensive inpatient rehab setting. Physiatrist is providing close team supervision and 24 hour management of active medical problems listed below. Physiatrist and rehab team continue to assess barriers to discharge/monitor patient progress toward functional and medical goals  Care Tool:  Bathing    Body parts bathed by patient: Left arm, Chest, Abdomen, Front perineal area, Right upper leg, Left upper leg, Face   Body parts bathed by helper: Right arm, Buttocks, Right lower leg, Left lower leg     Bathing assist Assist Level: Maximal Assistance - Patient 24 - 49%     Upper Body Dressing/Undressing Upper  body dressing   What is the patient wearing?: Pull over shirt    Upper body assist Assist Level: Moderate Assistance - Patient 50 - 74%    Lower Body Dressing/Undressing Lower body dressing      What is the patient wearing?: Pants     Lower body assist Assist for lower body dressing: Total Assistance - Patient < 25%     Toileting Toileting    Toileting assist Assist for toileting: Total Assistance - Patient < 25% Assistive Device Comment: urinal   Transfers Chair/bed transfer  Transfers assist     Chair/bed transfer assist level: Moderate Assistance - Patient 50 - 74%     Locomotion Ambulation   Ambulation assist       Assist level: 2 helpers Assistive device: Other (comment) (3 Musketeer) Max distance: 60 ft   Walk 10 feet activity   Assist  Walk 10 feet activity did not occur: Safety/medical concerns  Assist level: Moderate Assistance - Patient - 50 - 74% Assistive device: Other (comment) (R rail)   Walk 50 feet activity   Assist Walk 50 feet with 2 turns activity did not occur: Safety/medical concerns  Assist level: 2 helpers Assistive device: Other (comment) (3 Musketeer)    Walk 150 feet activity   Assist Walk 150 feet activity did not occur: Safety/medical concerns         Walk 10 feet on uneven surface  activity   Assist Walk 10 feet on uneven surfaces activity did not occur: Safety/medical concerns         Wheelchair     Assist Is the patient using a wheelchair?: Yes Type of Wheelchair: Manual    Wheelchair assist level: Dependent - Patient 0%      Wheelchair 50 feet with 2 turns activity    Assist        Assist Level: Dependent - Patient 0%   Wheelchair 150 feet activity     Assist      Assist Level: Dependent - Patient 0%   Blood pressure 104/65, pulse 62, temperature 97.6 F (36.4 C), temperature source Oral, resp. rate 18, height 6' (1.829 m), weight 92.4 kg, SpO2 96 %.  Medical Problem List and Plan: 1. 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             -patient may shower               -ELOS/Goals: 03/30/22           -Continue CIR therapies including PT, OT  2.  Antithrombotics: -DVT/anticoagulation:  pt with left posterior tib dvt.  -  lovenox '40mg'$  q12 -re-doppler 5/24 demonstrated persistent left posterior tib dvt -repeat dopplers 03/27/22 -activity as tolerated             -antiplatelet therapy: none 3. Pain Management: Tylenol, hydrocodone, robaxin as needed             - Gabapentin '100mg'$  BID   -elevation/support of left shoulder for sublux.  Harness?   -estim per OT 4. Mood: team providing emotional support             -antipsychotic agents: n/a             --Depression continue Lexapro 20 mg daily   5. Neuropsych: This patient is capable of making decisions on his own behalf. 6. Skin/Wound Care: Routine skin care checks             --  Monitor surgical incision 7. Fluids/Electrolytes/Nutrition:    - continue to push fluids  -protein supp for low albumin 9: History of multiple meningiomas/s/p resection:             -- Continue Keppra and Lamictal             -- Calcium carbonate tablets 3 times daily   -continue above, off steroids currently 10: Dyslipidemia: Continue Crestor 20 mg daily 11: History of GERD: Continue Protonix 40 mg daily 12: DM-2: diet controlled (A1c = 5.7 on 02/22/2022) 13: Anemia/acute intra-op blood loss: follow up hgb a little lower to 10.4 but he has fluctuated between 10-12 during this admission  6/2 hgb holding at 10.4 today 14. Constipation: some results 5/17, none since-  -5/26 --last bm   - continue  magnesium gluconate '250mg'$  HS  - senna-s bid, miralax bid as mtc regimen  - added  MOM 30cc at bedtime per home regimen to above  -6/2 results with SMOG enema x 2   -started linzess yesterday   -continue lower dose senna-s   -observe for pattern 15. Spasticity:   night splint for LLE, tolerating  -continue trial of baclofen '10mg'$  tid  -splinting, rom with therapies   -improved -  16. Hypertension:    -continue magnesium gluconate '250mg'$  HS    03/24/2022    4:16 AM 03/23/2022    7:22 PM 03/23/2022    2:20 PM  Vitals with BMI  Systolic 315 99 400  Diastolic 65 86 93  Pulse 62 81 80   6/1 controlled    LOS: 17 days A FACE TO FACE EVALUATION WAS PERFORMED  Meredith Staggers 03/24/2022, 11:08 AM

## 2022-03-24 NOTE — Progress Notes (Signed)
Physical Therapy Session Note  Patient Details  Name: SAYON PRIDE MRN: [id number] Date of Birth: [birth year]  Today's Date: 03/24/2022 PT Individual Time: 1405-1450 PT Individual Time Calculation (min): 45 min   Short Term Goals:  Week 3:  PT Short Term Goal 1 (Week 3): Patient will perform bed mobility with mod A >75% of the time. PT Short Term Goal 2 (Week 3): Patient will perform Stedy transfers with min A >50% of the time. PT Short Term Goal 3 (Week 3): Patient will propel power w/c >200 feet with only min cues for attention to the L. PT Short Term Goal 4 (Week 3): Patient will be independent with power w/c setting with pressure relief and adjustements for comfort. Week 4:      Skilled Therapeutic Interventions/Progress Updates:   Pt received supine in bed and agreeable to PT at bed level. PT performed AAROM/PROM BLE into ankle DF, HS lengthening, and hip ER/IR. Each performed 2 x 1 min Bil with overpressure into end range. Hip/knee flexion/extension in synergy 2 x 12 BLE with noted activation into extension on the RLE. SLR 2 x 12 with trace activation intermittently on the LLE, hip adduction/adduction in hooklying x 12 with trace activation into abduction intermittently. Pt assisted with repositioning in bed with max assist to improve trunkal alignment. Pt left in bed with call bell in reach and all needs met.        Therapy Documentation Precautions:  Precautions Precautions: Fall, Other (comment) Precaution Comments: L hemi Required Braces or Orthoses: Other Brace (Wrist splint and PRAFO) Other Brace: Wrist splint and PRAFO Restrictions Weight Bearing Restrictions: No General: PT Amount of Missed Time (min): 15 Minutes PT Missed Treatment Reason: Patient fatigue Vital Signs: Therapy Vitals Temp: 98.2 F (36.8 C) Pulse Rate: 73 Resp: 17 BP: 101/70 Patient Position (if appropriate): Sitting Pain: denies   Therapy/Group: Individual Therapy  Golden Pop 03/24/2022, 5:03 PM

## 2022-03-24 NOTE — Progress Notes (Signed)
Occupational Therapy Session Note  Patient Details  Name: Patrick Buchanan MRN: [id number] Date of Birth: 05/02/1951  Today's Date: 03/24/2022 OT Individual Time: 0850-1010 OT Individual Time Calculation (min): 80 min    Short Term Goals: Week 3:  OT Short Term Goal 1 (Week 3): STG=LTG d/t ELOS  Skilled Therapeutic Interventions/Progress Updates:    Pt received supine with no c/o pain, agreeable to OT session. He completed peri hygiene supine with mod A to wash posteriorly and to don brief. Pt opting to only wear gown and incontinence brief. Pt completed rolling L with min A and max A to roll R. Min cueing for hand placement. He was instructed in compensatory methods to come EOB and manage LUE/LLE. He required light mod A to come to EOB- elevating trunk. Min cueing to walk RUE up to sit up. He completed UB ADLs with min A to wash under the RUE and manage LUE. He was able to maintain sitting balance unsupported and with no UE support for 10+ min with (S). He completed a sit > stand in the stedy with min A. Transfer to [facility]. Oral care at the sink with set up assist. He navigated the w/c to the therapy gym with several near wall collisions, requiring min-mod cueing. Remainder of session focused on increasing L glenohumeral joint stability and positioning of the scapula into retraction. 1:1 NMES applied to supraspinatus and the middle traps/rhomboids to help approximate scapular to reduce sublux GH, promote scapular retraction, and reduce pain. Active assist provided (more like PROM d/t no activation) in gravity eliminated plane with OT providing heavy scapular facilitation into retraction.   Ratio 1:3 Rate 35 pps Waveform- Asymmetric Ramp 1.0 Pulse 352 Intensity- 32 Duration -  20 Report of pain at the beginning of session 0/10 Report of pain at the end of session 0/10 No adverse reactions after treatment and is skin intact.   Pt returned to his room and was left sitting up in the Cut Off with  all needs within reach.   Therapy Documentation Precautions:  Precautions Precautions: Fall, Other (comment) Precaution Comments: L hemi Required Braces or Orthoses: Other Brace (Wrist splint and PRAFO) Other Brace: Wrist splint and PRAFO Restrictions Weight Bearing Restrictions: No   Therapy/Group: Individual Therapy  Curtis Sites 03/24/2022, 6:30 AM

## 2022-03-24 NOTE — Progress Notes (Signed)
Physical Therapy Session Note  Patient Details  Name: Patrick Buchanan MRN: [id number] Date of Birth: [birth year]  Today's Date: 03/24/2022 PT Individual Time: 1016-1100 PT Individual Time Calculation (min): 44 min   Short Term Goals: Week 2:  PT Short Term Goal 1 (Week 2): Patient will improve score on Berg Balance Scale by 7 points to meet [name]. PT Short Term Goal 1 - Progress (Week 2): Not progressing PT Short Term Goal 2 (Week 2): Patient will ambulate >100 feet using LRAD with mod A of 1-2 people. PT Short Term Goal 2 - Progress (Week 2): Progressing toward goal PT Short Term Goal 3 (Week 2): Patient will perform basic transfers with min A >25% of the time. PT Short Term Goal 3 - Progress (Week 2): Not progressing PT Short Term Goal 4 (Week 2): Paitent will perform bed mobility with mod A with or without use of bed rails. PT Short Term Goal 4 - Progress (Week 2): Partly met (able to perform supin to sit with bed rails with mod A, mod-max A for sit to supine)  Skilled Therapeutic Interventions/Progress Updates: Pt presents sitting reclined in TIS and agreeable to therapy.  Pt states some pain to L foot/shin.  Left hip ER w/ pressure on foot.  Towel roll placed at L thigh/knee w/ improved positioning.  Pt states feels better.  Pt performed functions to bring w/c to neutral and negotiated power chair out of room and to dayroom.  Pt requires supervision and occasional verbal cues for obstacles to left.  Pt negotiated to Hi-Lo table.  Pt performing all functions for power and moving hand controls for use of table.  Pt performed multiple sit to stand a table w/ weight-bearing to LLE/UE and wt shift w/ blocking of L knee and faciltiation at L elbow.  Pt required seated rest breaks.  Pt states pain decreased w/ activity.  Pt performed controls of w/c to return to room.  Pt negotiated to room and in to side of bed, but required PT assist to back to side of bed.  Pt reclined w/c and turned off power.   Nursing aware of pt wish to return to bed.     Therapy Documentation Precautions:  Precautions Precautions: Fall, Other (comment) Precaution Comments: L hemi Required Braces or Orthoses: Other Brace (Wrist splint and PRAFO) Other Brace: Wrist splint and PRAFO Restrictions Weight Bearing Restrictions: No General:   Vital Signs:   Pain:5/10 L shin      Therapy/Group: Individual Therapy  [name] 03/24/2022, 1:50 PM

## 2022-03-24 NOTE — Progress Notes (Signed)
Occupational Therapy Session Note  Patient Details  Name: Patrick Buchanan MRN: [id number] Date of Birth: 09-16-1951  Today's Date: 03/24/2022 OT Individual Time: 9242-6834 OT Individual Time Calculation (min): 28 min   Short Term Goals: Week 3:  OT Short Term Goal 1 (Week 3): STG=LTG d/t ELOS  Skilled Therapeutic Interventions/Progress Updates:    Pt greeted semi-reclined in bed. Nursing reported patient had been up most of the morning and JUST got back into bed. Pt agreeable to bed level L UE NMR. Pt tangential and needed cues to attend to task. Pt able to grasp unweighted dowel rod with L hand, then worked on integrating B UE's to perform chest press, and bicep curls. Pt needed max A from elbow to push unweighted bar, and cues to try to perform motion simultaneously with R UE. Pt brought into gravity eliminated sidelying position on R and OT provided joint input through wrist and elbow to bring pt through full ROM of shoulder flex/ext and elbow flex/ext. Pt positioned in bed after NMR. Pt very particular on positioning of tray table, phones, pillows, ect. Pt left semi-reclined in bed with needs met.  Therapy Documentation Precautions:  Precautions Precautions: Fall, Other (comment) Precaution Comments: L hemi Required Braces or Orthoses: Other Brace (Wrist splint and PRAFO) Other Brace: Wrist splint and PRAFO Restrictions Weight Bearing Restrictions: No Pain:  Denies pain   Therapy/Group: Individual Therapy  Valma Cava 03/24/2022, 1:50 PM

## 2022-03-25 NOTE — Progress Notes (Signed)
Physical Therapy Session Note  Patient Details  Name: Patrick Buchanan MRN: [id number] Date of Birth: 1951/06/10  Today's Date: 03/25/2022 PT Individual Time: 1001-1100 PT Individual Time Calculation (min): 59 min   Short Term Goals: Week 2:  PT Short Term Goal 1 (Week 2): Patient will improve score on Berg Balance Scale by 7 points to meet [name]. PT Short Term Goal 1 - Progress (Week 2): Not progressing PT Short Term Goal 2 (Week 2): Patient will ambulate >100 feet using LRAD with mod A of 1-2 people. PT Short Term Goal 2 - Progress (Week 2): Progressing toward goal PT Short Term Goal 3 (Week 2): Patient will perform basic transfers with min A >25% of the time. PT Short Term Goal 3 - Progress (Week 2): Not progressing PT Short Term Goal 4 (Week 2): Paitent will perform bed mobility with mod A with or without use of bed rails. PT Short Term Goal 4 - Progress (Week 2): Partly met (able to perform supin to sit with bed rails with mod A, mod-max A for sit to supine)  Skilled Therapeutic Interventions/Progress Updates: Pt presents supine in bed but agreeable to therapy.  Pt states wet brief.  Pt rolled to left w/ mod A and held in sidelying using rail for total A for doffing soiled brief and pericare.  Clean brief placed and +2 assisted to roll to R mod A w/ L knee flexed into hook-lying position.  Pt rolled to R and required mod A for log roll to sit and verbal cues for sequencing to improve independence.  Pt sat EOB w/o assist for doffing gown and donning clean gown w/ mod A for LUE.  Pt performed sit to stand from bed to [facility] w/ B hands on bar and mod A+2.  Pt stood w/ cues for l knee extension and trunk upright.  Pt c/o pain L medial knee and sat on perch w/ mod A and verbal cues for slow controlled descent.  Pt performed multiple sit to stand transfers from perch w/ mod to min A and facilitation at L elbow to approximate shoulder.  Pt performed partial squats w/ LLE participation, but gradually  fatiguing.  Pt returned to bed w/ mod to max  A and re-positioned in bed for comfort.  Pt very particular about placement of pillows and positioning.  Bed alarm on and all needs in reach.     Therapy Documentation Precautions:  Precautions Precautions: Fall, Other (comment) Precaution Comments: L hemi Required Braces or Orthoses: Other Brace (Wrist splint and PRAFO) Other Brace: Wrist splint and PRAFO Restrictions Weight Bearing Restrictions: No General:   Vital Signs: Therapy Vitals Temp: 97.8 F (36.6 C) Temp Source: Oral Pulse Rate: 64 Resp: 15 BP: 105/68 Patient Position (if appropriate): Lying Oxygen Therapy SpO2: 95 % O2 Device: Room Air Pain:5/10 L knee Pain Assessment Pain Scale: 0-10 Pain Score: 3  Pain Location: Generalized Mobility:       Therapy/Group: Individual Therapy  [name] 03/25/2022, 11:04 AM

## 2022-03-25 NOTE — Progress Notes (Signed)
PROGRESS NOTE   Subjective/Complaints:  Doing well with therapy Pain present in left medial knee Incontinent of urine Using Stedy    ROS: Patient denies fever, rash, sore throat, blurred vision, dizziness, nausea, vomiting, diarrhea, cough, shortness of breath or chest pain,  back/neck pain, headache, or mood change. +urinary incontinence   Objective:   No results found. Recent Labs    03/24/22 0548  WBC 4.7  HGB 10.4*  HCT 32.5*  PLT 162     No results for input(s): NA, K, CL, CO2, GLUCOSE, BUN, CREATININE, CALCIUM in the last 72 hours.     Intake/Output Summary (Last 24 hours) at 03/25/2022 1429 Last data filed at 03/25/2022 1313 Gross per 24 hour  Intake 440 ml  Output 1200 ml  Net -760 ml        Physical Exam: Vital Signs Blood pressure 97/64, pulse 73, temperature 98.3 F (36.8 C), temperature source Oral, resp. rate 16, height 6' (1.829 m), weight 92.4 kg, SpO2 97 %. Gen: no distress, normal appearing HEENT: oral mucosa pink and moist, NCAT Cardio: Reg rate Chest: normal effort, normal rate of breathing Abd: soft, non-distended Ext: no clubbing, cyanosis, or edema Psych: pleasant and cooperative  Skin: Clean and intact without signs of breakdown, scalp remains incision cdi Neuro:  Alert and oriented x 3. Fair insight and awareness. Intact Memory. Normal language and speech. CN II-XII grossly intact.  LUE 0 shoulder, bicep, tricep, 3+/5 wrist and 3+/5 finger flexors. RUE 4-5/5. LLE with 1 to 1+ /5 HE, KE and trace at foot. RLE 4/5. Minimal extensor tone LLE and  minimal  flexor tone in LUE. Sensory: intact to LT and pain in all 4 extremities. DTR's 3+ LLE, 2+ LUE. Musculoskeletal: heel cord flexible to neutral. Left shoulder subluxed 1/2"--really stable in appearance.. LUE proximal muscle atrophy     Assessment/Plan: 1. Functional deficits which require 3+ hours per day of interdisciplinary therapy  in a comprehensive inpatient rehab setting. Physiatrist is providing close team supervision and 24 hour management of active medical problems listed below. Physiatrist and rehab team continue to assess barriers to discharge/monitor patient progress toward functional and medical goals  Care Tool:  Bathing    Body parts bathed by patient: Left arm, Chest, Abdomen, Front perineal area, Right upper leg, Left upper leg, Face   Body parts bathed by helper: Right arm, Buttocks, Right lower leg, Left lower leg     Bathing assist Assist Level: Maximal Assistance - Patient 24 - 49%     Upper Body Dressing/Undressing Upper body dressing   What is the patient wearing?: Pull over shirt    Upper body assist Assist Level: Moderate Assistance - Patient 50 - 74%    Lower Body Dressing/Undressing Lower body dressing      What is the patient wearing?: Pants     Lower body assist Assist for lower body dressing: Total Assistance - Patient < 25%     Toileting Toileting    Toileting assist Assist for toileting: Total Assistance - Patient < 25% Assistive Device Comment: urinal   Transfers Chair/bed transfer  Transfers assist     Chair/bed transfer assist level: Moderate Assistance - Patient  50 - 74%     Locomotion Ambulation   Ambulation assist      Assist level: 2 helpers Assistive device: Other (comment) (3 Musketeer) Max distance: 60 ft   Walk 10 feet activity   Assist  Walk 10 feet activity did not occur: Safety/medical concerns  Assist level: Moderate Assistance - Patient - 50 - 74% Assistive device: Other (comment) (R rail)   Walk 50 feet activity   Assist Walk 50 feet with 2 turns activity did not occur: Safety/medical concerns  Assist level: 2 helpers Assistive device: Other (comment) (3 Musketeer)    Walk 150 feet activity   Assist Walk 150 feet activity did not occur: Safety/medical concerns         Walk 10 feet on uneven surface   activity   Assist Walk 10 feet on uneven surfaces activity did not occur: Safety/medical concerns         Wheelchair     Assist Is the patient using a wheelchair?: Yes Type of Wheelchair: Power    Wheelchair assist level: Supervision/Verbal cueing Max wheelchair distance: 60    Wheelchair 50 feet with 2 turns activity    Assist        Assist Level: Supervision/Verbal cueing   Wheelchair 150 feet activity     Assist  Wheelchair 150 feet activity did not occur: Safety/medical concerns   Assist Level: Dependent - Patient 0%   Blood pressure 97/64, pulse 73, temperature 98.3 F (36.8 C), temperature source Oral, resp. rate 16, height 6' (1.829 m), weight 92.4 kg, SpO2 97 %.  Medical Problem List and Plan: 1. 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             -patient may shower               -ELOS/Goals: 03/30/22           -Continue CIR therapies including PT, OT  2.  Antithrombotics: -DVT/anticoagulation:  pt with left posterior tib dvt.  -  lovenox '40mg'$  q12 -re-doppler 5/24 demonstrated persistent left posterior tib dvt -repeat dopplers 03/27/22 -activity as tolerated             -antiplatelet therapy: none 3. Pain Management: Tylenol, hydrocodone, robaxin as needed             - Gabapentin '100mg'$  BID   -elevation/support of left shoulder for sublux. Harness?   -estim per OT 4. Mood: team providing emotional support             -antipsychotic agents: n/a             --Depression continue Lexapro 20 mg daily   5. Neuropsych: This patient is capable of making decisions on his own behalf. 6. Skin/Wound Care: Routine skin care checks             -- Monitor surgical incision 7. Fluids/Electrolytes/Nutrition:    - continue to push fluids  -protein supp for low albumin 9: History of multiple meningiomas/s/p resection:             -- Continue Keppra and Lamictal             -- Calcium  carbonate tablets 3 times daily   -continue above, off steroids currently 10: Dyslipidemia: Continue Crestor 20 mg daily 11: History of GERD: Continue Protonix 40 mg daily 12: DM-2: diet controlled (A1c = 5.7 on 02/22/2022) 13: Anemia/acute intra-op blood loss: follow up hgb a little  lower to 10.4 but he has fluctuated between 10-12 during this admission  6/2 hgb holding at 10.4 today 14. Constipation: some results 5/17, none since-  -5/26 --last bm   - continue  magnesium gluconate '250mg'$  HS  - senna-s bid, miralax bid as mtc regimen  - added  MOM 30cc at bedtime per home regimen to above  -6/2 results with SMOG enema x 2   -started linzess yesterday   -continue lower dose senna-s   -observe for pattern 15. Spasticity:   night splint for LLE, tolerating  -continue trial of baclofen '10mg'$  tid  -splinting, rom with therapies   -improved -  16. Hypertension:    -continue magnesium gluconate '250mg'$  HS    03/25/2022    1:13 PM 03/25/2022    7:22 AM 03/24/2022    8:08 PM  Vitals with BMI  Systolic 97 093 235  Diastolic 64 68 68  Pulse 73 64 79   6/3 controlled    LOS: 18 days A FACE TO FACE EVALUATION WAS PERFORMED  Bryauna Byrum P Marquett Bertoli 03/25/2022, 2:29 PM

## 2022-03-26 NOTE — Progress Notes (Signed)
PROGRESS NOTE   Subjective/Complaints:  No new complaints this morning  Sleepy  ROS: Patient denies fever, rash, sore throat, blurred vision, dizziness, nausea, vomiting, diarrhea, cough, shortness of breath or chest pain,  back/neck pain, headache, or mood change. +urinary incontinence   Objective:   No results found. Recent Labs    03/24/22 0548  WBC 4.7  HGB 10.4*  HCT 32.5*  PLT 162     No results for input(s): NA, K, CL, CO2, GLUCOSE, BUN, CREATININE, CALCIUM in the last 72 hours.     Intake/Output Summary (Last 24 hours) at 03/26/2022 1700 Last data filed at 03/26/2022 1350 Gross per 24 hour  Intake 480 ml  Output 600 ml  Net -120 ml        Physical Exam: Vital Signs Blood pressure 107/73, pulse 76, temperature 98.5 F (36.9 C), resp. rate 17, height 6' (1.829 m), weight 92.4 kg, SpO2 97 %. Gen: no distress, normal appearing HEENT: oral mucosa pink and moist, NCAT Cardio: Reg rate Chest: normal effort, normal rate of breathing Abd: soft, non-distended  Ext: no clubbing, cyanosis, or edema Psych: pleasant and cooperative  Skin: Clean and intact without signs of breakdown, scalp remains incision cdi Neuro:  Alert and oriented x 3. Fair insight and awareness. Intact Memory. Normal language and speech. CN II-XII grossly intact.  LUE 0 shoulder, bicep, tricep, 3+/5 wrist and 3+/5 finger flexors. RUE 4-5/5. LLE with 1 to 1+ /5 HE, KE and trace at foot. RLE 4/5. Minimal extensor tone LLE and  minimal  flexor tone in LUE. Sensory: intact to LT and pain in all 4 extremities. DTR's 3+ LLE, 2+ LUE. Musculoskeletal: heel cord flexible to neutral. Left shoulder subluxed 1/2"--really stable in appearance.. LUE proximal muscle atrophy     Assessment/Plan: 1. Functional deficits which require 3+ hours per day of interdisciplinary therapy in a comprehensive inpatient rehab setting. Physiatrist is providing close team  supervision and 24 hour management of active medical problems listed below. Physiatrist and rehab team continue to assess barriers to discharge/monitor patient progress toward functional and medical goals  Care Tool:  Bathing    Body parts bathed by patient: Left arm, Chest, Abdomen, Front perineal area, Right upper leg, Left upper leg, Face   Body parts bathed by helper: Right arm, Buttocks, Right lower leg, Left lower leg     Bathing assist Assist Level: Maximal Assistance - Patient 24 - 49%     Upper Body Dressing/Undressing Upper body dressing   What is the patient wearing?: Pull over shirt    Upper body assist Assist Level: Moderate Assistance - Patient 50 - 74%    Lower Body Dressing/Undressing Lower body dressing      What is the patient wearing?: Pants     Lower body assist Assist for lower body dressing: Total Assistance - Patient < 25%     Toileting Toileting    Toileting assist Assist for toileting: Total Assistance - Patient < 25% Assistive Device Comment: urinal   Transfers Chair/bed transfer  Transfers assist     Chair/bed transfer assist level: Moderate Assistance - Patient 50 - 74%     Locomotion Ambulation   Ambulation  assist      Assist level: 2 helpers Assistive device: Other (comment) (3 Musketeer) Max distance: 60 ft   Walk 10 feet activity   Assist  Walk 10 feet activity did not occur: Safety/medical concerns  Assist level: Moderate Assistance - Patient - 50 - 74% Assistive device: Other (comment) (R rail)   Walk 50 feet activity   Assist Walk 50 feet with 2 turns activity did not occur: Safety/medical concerns  Assist level: 2 helpers Assistive device: Other (comment) (3 Musketeer)    Walk 150 feet activity   Assist Walk 150 feet activity did not occur: Safety/medical concerns         Walk 10 feet on uneven surface  activity   Assist Walk 10 feet on uneven surfaces activity did not occur: Safety/medical  concerns         Wheelchair     Assist Is the patient using a wheelchair?: Yes Type of Wheelchair: Power    Wheelchair assist level: Supervision/Verbal cueing Max wheelchair distance: 60    Wheelchair 50 feet with 2 turns activity    Assist        Assist Level: Supervision/Verbal cueing   Wheelchair 150 feet activity     Assist  Wheelchair 150 feet activity did not occur: Safety/medical concerns   Assist Level: Dependent - Patient 0%   Blood pressure 107/73, pulse 76, temperature 98.5 F (36.9 C), resp. rate 17, height 6' (1.829 m), weight 92.4 kg, SpO2 97 %.  Medical Problem List and Plan: 1. 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             -patient may shower               -ELOS/Goals: 03/30/22           -Continue CIR therapies including PT, OT  2.  Antithrombotics: -DVT/anticoagulation:  pt with left posterior tib dvt.  -  lovenox '40mg'$  q12 -re-doppler 5/24 demonstrated persistent left posterior tib dvt -repeat dopplers 03/27/22 -activity as tolerated             -antiplatelet therapy: none 3. Pain Management: Tylenol, hydrocodone, robaxin as needed             - Gabapentin '100mg'$  BID   -elevation/support of left shoulder for sublux. Harness?   -estim per OT 4. Mood: team providing emotional support             -antipsychotic agents: n/a             --Depression continue Lexapro 20 mg daily   5. Neuropsych: This patient is capable of making decisions on his own behalf. 6. Skin/Wound Care: Routine skin care checks             -- Monitor surgical incision 7. Fluids/Electrolytes/Nutrition:    - continue to push fluids  -protein supp for low albumin 9: History of multiple meningiomas/s/p resection:             -- Continue Keppra and Lamictal             -- Calcium carbonate tablets 3 times daily   -continue above, off steroids currently 10: Dyslipidemia: Continue Crestor 20 mg  daily 11: History of GERD: Continue Protonix 40 mg daily 12: DM-2: diet controlled (A1c = 5.7 on 02/22/2022) 13: Anemia/acute intra-op blood loss: follow up hgb a little lower to 10.4 but he has fluctuated between 10-12 during this admission  6/2 hgb  holding at 10.4 today 14. Constipation: some results 5/17, none since-  -5/26 --last bm   - continue  magnesium gluconate '250mg'$  HS  - senna-s bid, miralax bid as mtc regimen  - added  MOM 30cc at bedtime per home regimen to above  -6/2 results with SMOG enema x 2   -started linzess yesterday   -continue lower dose senna-s   -observe for pattern 15. Spasticity:   night splint for LLE, tolerating  -continue trial of baclofen '10mg'$  tid  -splinting, rom with therapies   -improved -  16. Hypertension:    -continue magnesium gluconate '250mg'$  HS    03/26/2022    1:50 PM 03/26/2022    4:53 AM 03/25/2022    7:36 PM  Vitals with BMI  Systolic 382 505 397  Diastolic 73 77 71  Pulse 76 65 79   6/4 controlled    LOS: 19 days A FACE TO FACE EVALUATION WAS PERFORMED  Patrick Buchanan 03/26/2022, 5:00 PM

## 2022-03-27 ENCOUNTER — Inpatient Hospital Stay (HOSPITAL_COMMUNITY): Payer: Medicare HMO

## 2022-03-27 DIAGNOSIS — I82409 Acute embolism and thrombosis of unspecified deep veins of unspecified lower extremity: Secondary | ICD-10-CM

## 2022-03-27 DIAGNOSIS — M25562 Pain in left knee: Secondary | ICD-10-CM

## 2022-03-27 MED ORDER — SENNOSIDES-DOCUSATE SODIUM 8.6-50 MG PO TABS
1.0000 | ORAL_TABLET | Freq: Every day | ORAL | Status: DC
  Administered 2022-03-27: 1 via ORAL
  Filled 2022-03-27: qty 1

## 2022-03-27 NOTE — Progress Notes (Signed)
PROGRESS NOTE   Subjective/Complaints:  Pt says he had a pretty good weekend. Had some stool output this weekend, mostly mushy. Right hamstring and shin pain. Therapy notes that he's a little more tight.   ROS: Patient denies fever, rash, sore throat, blurred vision, dizziness, nausea, vomiting, cough, shortness of breath or chest pain, joint or back/neck pain, headache, or mood change.    Objective:   No results found. No results for input(s): WBC, HGB, HCT, PLT in the last 72 hours.    No results for input(s): NA, K, CL, CO2, GLUCOSE, BUN, CREATININE, CALCIUM in the last 72 hours.     Intake/Output Summary (Last 24 hours) at 03/27/2022 1139 Last data filed at 03/27/2022 0569 Gross per 24 hour  Intake 720 ml  Output 1150 ml  Net -430 ml        Physical Exam: Vital Signs Blood pressure 111/73, pulse 62, temperature 98 F (36.7 C), temperature source Oral, resp. rate 18, height 6' (1.829 m), weight 92.4 kg, SpO2 98 %. Constitutional: No distress . Vital signs reviewed. HEENT: NCAT, EOMI, oral membranes moist Neck: supple Cardiovascular: RRR without murmur. No JVD    Respiratory/Chest: CTA Bilaterally without wheezes or rales. Normal effort    GI/Abdomen: BS +, non-tender, non-distended Ext: no clubbing, cyanosis, or edema Psych: pleasant and cooperative  Skin: Clean and intact without signs of breakdown, scalp remains incision cdi Neuro:  Alert and oriented x 3. Fair insight and awareness. Intact Memory. Normal language and speech. CN II-XII grossly intact.  LUE 0 shoulder, bicep, tricep, 3+/5 wrist and 3+/5 finger flexors. RUE 4-5/5. LLE with 1 to 1+ /5 HE, KE and trace at foot. RLE 4/5. Left hamstring is tight 2/4 and tender with attempts at stretching. Sensory: intact to LT and pain in all 4 extremities. DTR's 3+ LLE, 2+ LUE. Musculoskeletal: heel cord still  flexible to neutral. Left shoulder subluxed 1/2"--really  stable in appearance.. LUE proximal muscle atrophy     Assessment/Plan: 1. Functional deficits which require 3+ hours per day of interdisciplinary therapy in a comprehensive inpatient rehab setting. Physiatrist is providing close team supervision and 24 hour management of active medical problems listed below. Physiatrist and rehab team continue to assess barriers to discharge/monitor patient progress toward functional and medical goals  Care Tool:  Bathing    Body parts bathed by patient: Left arm, Chest, Abdomen, Front perineal area, Right upper leg, Left upper leg, Face   Body parts bathed by helper: Right arm, Buttocks, Right lower leg, Left lower leg     Bathing assist Assist Level: Maximal Assistance - Patient 24 - 49%     Upper Body Dressing/Undressing Upper body dressing   What is the patient wearing?: Pull over shirt    Upper body assist Assist Level: Moderate Assistance - Patient 50 - 74%    Lower Body Dressing/Undressing Lower body dressing      What is the patient wearing?: Pants     Lower body assist Assist for lower body dressing: Total Assistance - Patient < 25%     Toileting Toileting    Toileting assist Assist for toileting: Total Assistance - Patient < 25% Assistive Device  Comment: urinal   Transfers Chair/bed transfer  Transfers assist     Chair/bed transfer assist level: Moderate Assistance - Patient 50 - 74%     Locomotion Ambulation   Ambulation assist      Assist level: 2 helpers Assistive device: Other (comment) (3 Musketeer) Max distance: 60 ft   Walk 10 feet activity   Assist  Walk 10 feet activity did not occur: Safety/medical concerns  Assist level: Moderate Assistance - Patient - 50 - 74% Assistive device: Other (comment) (R rail)   Walk 50 feet activity   Assist Walk 50 feet with 2 turns activity did not occur: Safety/medical concerns  Assist level: 2 helpers Assistive device: Other (comment) (3 Musketeer)     Walk 150 feet activity   Assist Walk 150 feet activity did not occur: Safety/medical concerns         Walk 10 feet on uneven surface  activity   Assist Walk 10 feet on uneven surfaces activity did not occur: Safety/medical concerns         Wheelchair     Assist Is the patient using a wheelchair?: Yes Type of Wheelchair: Power    Wheelchair assist level: Supervision/Verbal cueing Max wheelchair distance: 60    Wheelchair 50 feet with 2 turns activity    Assist        Assist Level: Supervision/Verbal cueing   Wheelchair 150 feet activity     Assist  Wheelchair 150 feet activity did not occur: Safety/medical concerns   Assist Level: Dependent - Patient 0%   Blood pressure 111/73, pulse 62, temperature 98 F (36.7 C), temperature source Oral, resp. rate 18, height 6' (1.829 m), weight 92.4 kg, SpO2 98 %.  Medical Problem List and Plan: 1. 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             -patient may shower               -ELOS/Goals: 03/30/22           -Continue CIR therapies including PT, OT  2.  Antithrombotics: -DVT/anticoagulation:  pt with left posterior tib dvt.  -  lovenox '40mg'$  q12 -re-doppler 5/24 demonstrated persistent left posterior tib dvt -repeat dopplers requested for 03/27/22 -activity as tolerated             -antiplatelet therapy: none 3. Pain Management: Tylenol, hydrocodone, robaxin as needed             - Gabapentin '100mg'$  BID   -elevation/support of left shoulder for sublux. Harness?   -estim per OT 4. Mood: team providing emotional support             -antipsychotic agents: n/a             --Depression continue Lexapro 20 mg daily   5. Neuropsych: This patient is capable of making decisions on his own behalf. 6. Skin/Wound Care: Routine skin care checks             -- Monitor surgical incision 7. Fluids/Electrolytes/Nutrition:    - continue to push  fluids  -protein supp for low albumin 9: History of multiple meningiomas/s/p resection:             -- Continue Keppra and Lamictal             -- Calcium carbonate tablets 3 times daily   -continue above, off steroids currently 10: Dyslipidemia: Continue Crestor 20 mg daily 11: History of GERD:  Continue Protonix 40 mg daily 12: DM-2: diet controlled (A1c = 5.7 on 02/22/2022) 13: Anemia/acute intra-op blood loss: follow up hgb a little lower to 10.4 but he has fluctuated between 10-12 during this admission  6/2 hgb holding at 10.4   14. Constipation: some results 5/17, none since-  -5/26 --last bm   - continue  magnesium gluconate '250mg'$  HS  - senna-s bid, miralax bid as mtc regimen  - added  MOM 30cc at bedtime per home regimen to above  -6/2 results with SMOG enema x 2  -continue linzess yesterday  -reduce senna-s  -observe for pattern 15. Spasticity:   night splint for LLE, tolerating  -continue trial of baclofen '10mg'$  tid, might need further titration  -splinting, rom with therapies   -improved -  16. Hypertension:    -continue magnesium gluconate '250mg'$  HS    03/27/2022    5:24 AM 03/26/2022    7:27 PM 03/26/2022    1:50 PM  Vitals with BMI  Systolic 834 196 222  Diastolic 73 72 73  Pulse 62 75 76   6/5 controlled    LOS: 20 days A FACE TO FACE EVALUATION WAS PERFORMED  Meredith Staggers 03/27/2022, 11:39 AM

## 2022-03-27 NOTE — Progress Notes (Signed)
Occupational Therapy Session Note  Patient Details  Name: Patrick Buchanan MRN: [id number] Date of Birth: Jul 29, 1951  Session 1 Today's Date: 03/27/2022 OT Individual Time: 3716-9678 OT Individual Time Calculation (min): 70 min    Session 2 Today's Date: 03/27/2022 OT Individual Time: 1405-1503 OT Individual Time Calculation (min): 58 min    Short Term Goals: Week 3:  OT Short Term Goal 1 (Week 3): STG=LTG d/t ELOS  Skilled Therapeutic Interventions/Progress Updates:    Pt received supine with no c/o pain, agreeable to OT session. Pt completed LB bathing supine with mod A. He required mod A to roll to the R and max A to the L. Reduced initiation today with verbose story telling. He transferred to the EOB with mod A. He was able to maintain sitting balance with CGA as he completed UB bathing/dressing (gown only). Mod A for bathing- to reach under the RUE and to manage L shoulder. He stood from EOB with the stedy with min A. Pt transferred to the power w/ c. Min cueing for positioning of the power w/c at the sink. He completed oral care with set up assist. He completed hair care with mod A- using hair washing tray to wash hair. Discussed features of the chair and opening up hip angle when spasticity increases. He required PROM at the L ankle, knee, and hip for pain management. Pt was left sitting up with max encouragement, as pt not wanting to stay up. Provided education on benefits of OOB.   [place] Stim One applied to pt's L anterior and middle deltoid to assist with joint approximation. He was able to tolerate 5 clicks today with no pain reported. 45 min unattended e-stim with no adverse skin reaction or c/o pain.  330 pulse width 35 Hz pulse rate On 8 sec/ off 8 sec Ramp up/ down 2 sec Symmetrical Biphasic wave form  Max intensity 138m at 500 Ohm load   Session 2 Pt received supine with no c/o pain. Discussed d/c planning and use of a DABSC. Pt agreeable after education and  demonstration. Pt completed bed mobility to EOB with mod A. He stood with the stedy with mod A. Difficulty elevating hips initially with LUE not on the stedy bar (to protect [place]) and he requested to place UE on the bar. He was transferred to the power W/c via stedy. Total A to scoot his hips back. He navigated the w/c to the therapy gym with poor navigation and L inattention overall. Discussed options for power w/c navigation- joystick vs goal post. Switched out joystick for goal post and pt was more successful in navigation overall and reported more confidence. Protraction scapular glides performed to increase stability of scapular joint. Pt returned to his room via w/c , requiring mod cueing for making it through the doorway. Stedy transfer back to bed. Pt was left supine with all needs met, bed alarm set. His daughter present and info provided re stedy use and measurements.    Therapy Documentation Precautions:  Precautions Precautions: Fall, Other (comment) Precaution Comments: L hemi Required Braces or Orthoses: Other Brace (Wrist splint and PRAFO) Other Brace: Wrist splint and PRAFO Restrictions Weight Bearing Restrictions: No  Therapy/Group: Individual Therapy  SCurtis Sites6/02/2022, 6:16 AM

## 2022-03-27 NOTE — Progress Notes (Signed)
Patient ID: Patrick Buchanan, male   DOB: 10/21/1951, 71 y.o.   MRN: [id number]  HHPT/OT/aide referral accepted by [facility].   *SW spoke with Christina/[facility] to discuss DME delivery. Reports hospital bed and TTB will be delivered to the home today. SW requested that the family be contacted. SW informed that the family will be contacted 1hr before delivery.   SW spoke with [name] to discuss above about DME delivery and HH. She has not received any phone calls at this point. SW informed will provide updates on other DME. SW shared will ask therapy to try hoyer lift during family edu tomorrow considering they are still searching for a stedy at this time. Discussed transport home as ramp likely to not be built by the time he discharges. SW shared ambulance transport is scheduled and they will take their father into the home. Confirms she and her sister will be in for family edu tomorrow.   Loralee Pacas, MSW, [place] Office: [phone] Cell: [phone] Fax: [phone]

## 2022-03-27 NOTE — Progress Notes (Signed)
Lower extremity venous LT study completed.   Please see CV Proc for preliminary results.   Aleila Syverson, RDMS, RVT  

## 2022-03-27 NOTE — Progress Notes (Signed)
Physical Therapy Session Note  Patient Details  Name: Patrick Buchanan MRN: [id number] Date of Birth: 12-Apr-1951  Today's Date: 03/27/2022 PT Individual Time: 0930-1045 PT Individual Time Calculation (min): 75 min   Short Term Goals: Week 3:  PT Short Term Goal 1 (Week 3): Patient will perform bed mobility with mod A >75% of the time. PT Short Term Goal 2 (Week 3): Patient will perform Stedy transfers with min A >50% of the time. PT Short Term Goal 3 (Week 3): Patient will propel power w/c >200 feet with only min cues for attention to the L. PT Short Term Goal 4 (Week 3): Patient will be independent with power w/c setting with pressure relief and adjustements for comfort.  Skilled Therapeutic Interventions/Progress Updates:     Patient in power w/c in the room upon PT arrival. Patient alert and agreeable to PT session. Patient reported 3-4/10 L medial knee pain during session, RN made aware. PT provided repositioning, rest breaks, and distraction as pain interventions throughout session.   Focused first half of session on trial with L lower extremity L300 Go Bioness FES system with assist from [place], [place] from OP neuro.   Set up system for L quadriceps and L anterior tibialis motor points to palpable and visible motor contraction to patient comfort. Settings saved on Tablet 1 under patient demographics for future use.   Therapeutic Activity: Bed Mobility: Patient performed sit to supine with mod A for lower extremity and trunk management. Provided cues for use of teach back method for use of R elbow for trunk control and bringing knees to chest to lift his legs onto the bed. Transfers: Patient performed a dependent transfer in the [place] with min A and increased time and cues for initiation. Provided hand over hand assist for L hand placement and verbal cues for forward weight shift and knee/hip/trunk extension L>R.  Wheelchair Mobility:  Patient propelled power wheelchair >100 feet x3  performing multiple 90 degree turns with supervision. Provided verbal cues for attention to the L with distractions, through doors, and during turns to prevent hitting walls or injury to his L extremities due to L inattention.   Neuromuscular Re-ed: Patient performed the following L lower extremity motor control activities FES from the Bioness system set as above: -sit to/from stand using R rail x2 and // bars with B upper extremity support x2 with min-mod A -attempted gait at R rail with mod-max A +2 with decreased trunk support and L hemi-body extension from previous trials, challenge to appropriate timing with Bioness stimulation due to poor balance and postural control -L forward/backward stepping x4 in // bars with improved patient activation and timing of hip flexors, DFs, and quads, total A for stepping back with no palpable activation of hamstrings   Limited assessment of use of Bioness system due to increased patient fatigue and reduced trunk/postural control in standing from last week.   Patient in bed, with increased time for positioning, at end of session with breaks locked, bed alarm set, and all needs within reach.   Therapy Documentation Precautions:  Precautions Precautions: Fall, Other (comment) Precaution Comments: L hemi Required Braces or Orthoses: Other Brace (Wrist splint and PRAFO) Other Brace: Wrist splint and PRAFO Restrictions Weight Bearing Restrictions: No   Therapy/Group: Individual Therapy  Patrick Buchanan L Patrick Buchanan PT, DPT  03/27/2022, 3:36 PM

## 2022-03-28 LAB — BASIC METABOLIC PANEL
Anion gap: 6 (ref 5–15)
BUN: 14 mg/dL (ref 8–23)
CO2: 29 mmol/L (ref 22–32)
Calcium: 8.7 mg/dL — ABNORMAL LOW (ref 8.9–10.3)
Chloride: 106 mmol/L (ref 98–111)
Creatinine, Ser: 0.75 mg/dL (ref 0.61–1.24)
GFR, Estimated: >60 mL/min (ref >60)
Glucose, Bld: 111 mg/dL — ABNORMAL HIGH (ref 70–99)
Potassium: 4 mmol/L (ref 3.5–5.1)
Sodium: 141 mmol/L (ref 135–145)

## 2022-03-28 LAB — CBC
HCT: 31.4 % — ABNORMAL LOW (ref 39.0–52.0)
Hemoglobin: 10.4 g/dL — ABNORMAL LOW (ref 13.0–17.0)
MCH: 31.3 pg (ref 26.0–34.0)
MCHC: 33.1 g/dL (ref 30.0–36.0)
MCV: 94.6 fL (ref 80.0–100.0)
Platelets: 188 10*3/uL (ref 150–400)
RBC: 3.32 MIL/uL — ABNORMAL LOW (ref 4.22–5.81)
RDW: 15.3 % (ref 11.5–15.5)
WBC: 4.2 10*3/uL (ref 4.0–10.5)
nRBC: 0 % (ref 0.0–0.2)

## 2022-03-28 MED ORDER — FLEET ENEMA 7-19 GM/118ML RE ENEM
1.0000 | ENEMA | Freq: Once | RECTAL | Status: AC
  Administered 2022-03-28: 1 via RECTAL
  Filled 2022-03-28: qty 1

## 2022-03-28 MED ORDER — MAGNESIUM HYDROXIDE 400 MG/5ML PO SUSP
400.0000 mL | Freq: Once | ORAL | Status: DC
  Filled 2022-03-28: qty 120

## 2022-03-28 MED ORDER — DICLOFENAC SODIUM 1 % EX GEL
2.0000 g | Freq: Three times a day (TID) | CUTANEOUS | Status: DC
  Administered 2022-03-28 – 2022-03-30 (×6): 2 g via TOPICAL
  Filled 2022-03-28: qty 100

## 2022-03-28 MED ORDER — SENNOSIDES-DOCUSATE SODIUM 8.6-50 MG PO TABS
2.0000 | ORAL_TABLET | Freq: Every day | ORAL | Status: DC
  Administered 2022-03-28 – 2022-03-29 (×2): 2 via ORAL
  Filled 2022-03-28 (×3): qty 2

## 2022-03-28 MED ORDER — SORBITOL 70 % SOLN
960.0000 mL | TOPICAL_OIL | Freq: Once | ORAL | Status: AC
  Administered 2022-03-28: 960 mL via RECTAL
  Filled 2022-03-28: qty 473

## 2022-03-28 NOTE — Progress Notes (Signed)
Physical Therapy Session Note  Patient Details  Name: EXANDER SHAUL MRN: [id number] Date of Birth: [birth year]  Today's Date: 03/28/2022 PT Individual Time: [phone] and 1445-1540 PT Individual Time Calculation (min): 60 min and 55 min Today's Date: 03/28/2022 PT Co-Treatment Time: 0815-0830 PT Co-Treatment Time Calculation (min): 15 min  Short Term Goals: Week 3:  PT Short Term Goal 1 (Week 3): Patient will perform bed mobility with mod A >75% of the time. PT Short Term Goal 2 (Week 3): Patient will perform Stedy transfers with min A >50% of the time. PT Short Term Goal 3 (Week 3): Patient will propel power w/c >200 feet with only min cues for attention to the L. PT Short Term Goal 4 (Week 3): Patient will be independent with power w/c setting with pressure relief and adjustements for comfort.  Skilled Therapeutic Interventions/Progress Updates:     Session 1: Patient in power w/c exiting the room with Colletta Maryland, OT prepared for co-treat for custom w/c evaluation upon PT arrival. Deberah Pelton, ATP, from [employer] present to assist with w/c evaluation for a custom power w/c. Patient alert and agreeable to PT session. Patient denied pain during session.  Therapeutic Activity: Bed Mobility: Patient performed supine to/from sit with ***. Provided verbal cues for ***. Transfers: Patient performed sit to/from stand x*** with ***. Provided verbal cues for***.  Gait Training:  Patient ambulated *** feet using *** with ***. Ambulated with ***. Provided verbal cues for ***.  Wheelchair Mobility:  Patient propelled wheelchair *** feet with ***. Provided verbal cues for ***  Neuromuscular Re-ed: Patient performed the following *** activities: ***  Therapeutic Exercise: Patient performed the following exercises with verbal and tactile cues for proper technique. ***  Patient in *** at end of session with breaks locked, *** alarm set, and all needs within reach.   Session 2: Patient in  *** upon PT arrival. Patient alert and agreeable to PT session. Patient denied pain during session.  Patient's *** present for family education and hands on training throughout session. Performed safe guarding with all mobility following PT cues and/or demonstration. Educated on fall risk/prevention, home modifications to prevent falls, and activation of emergency services in the event of a fall during session.   Therapeutic Activity: Bed Mobility: Patient performed supine to/from sit with ***. Provided verbal cues for ***. Transfers: Patient performed sit to/from stand x*** with ***. Provided verbal cues for***. Patient performed a simulated *** height car transfer with *** using ***. Provided cues for safe technique, ***.  Wheelchair Mobility:  Patient propelled wheelchair *** feet with ***. Provided verbal cues for ***  Neuromuscular Re-ed: Patient performed the following *** activities: ***  Therapeutic Exercise: Patient performed the following exercises with verbal and tactile cues for proper technique. ***  Patient in *** at end of session with breaks locked, *** alarm set, and all needs within reach.   Therapy Documentation Precautions:  Precautions Precautions: Fall, Other (comment) Precaution Comments: L hemi Required Braces or Orthoses: Other Brace (Wrist splint and PRAFO) Other Brace: Wrist splint and PRAFO Restrictions Weight Bearing Restrictions: No General:   Vital Signs:   Pain:   Mobility:   Locomotion :    Trunk/Postural Assessment :    Balance:   Exercises:   Other Treatments:      Therapy/Group: Individual Therapy  Nikoloz Huy L Mckaela Howley PT, DPT  03/28/2022, 12:27 PM

## 2022-03-28 NOTE — Plan of Care (Signed)
  Problem: Consults Goal: RH BRAIN INJURY PATIENT EDUCATION Description: Description: See Patient Education module for eduction specifics Outcome: Progressing Goal: Skin Care Protocol Initiated - if Braden Score 18 or less Description: If consults are not indicated, leave blank or document N/A Outcome: Progressing   Problem: RH BOWEL ELIMINATION Goal: RH STG MANAGE BOWEL WITH ASSISTANCE Description: STG Manage Bowel with [place]. Outcome: Progressing Goal: RH STG MANAGE BOWEL W/MEDICATION W/ASSISTANCE Description: STG Manage Bowel with Medication with [place]. Outcome: Progressing   Problem: RH BLADDER ELIMINATION Goal: RH STG MANAGE BLADDER WITH ASSISTANCE Description: STG Manage Bladder With Min Assistance Outcome: Progressing Goal: RH STG MANAGE BLADDER WITH MEDICATION WITH ASSISTANCE Description: STG Manage Bladder With Medication With [place]. Outcome: Progressing   Problem: RH SKIN INTEGRITY Goal: RH STG MAINTAIN SKIN INTEGRITY WITH ASSISTANCE Description: STG Maintain Skin Integrity With [place]. Outcome: Progressing Goal: RH STG ABLE TO PERFORM INCISION/WOUND CARE W/ASSISTANCE Description: STG Able To Perform Incision/Wound Care With [employer]. Outcome: Progressing   Problem: RH SAFETY Goal: RH STG ADHERE TO SAFETY PRECAUTIONS W/ASSISTANCE/DEVICE Description: STG Adhere to Safety Precautions With Cues and Reminders. Outcome: Progressing Goal: RH STG DECREASED RISK OF FALL WITH ASSISTANCE Description: STG Decreased Risk of Fall With [employer]. Outcome: Progressing   Problem: RH COGNITION-NURSING Goal: RH STG USES MEMORY AIDS/STRATEGIES W/ASSIST TO PROBLEM SOLVE Description: STG Uses Memory Aids/Strategies With Min Assistance to Problem Solve. Outcome: Progressing Goal: RH STG ANTICIPATES NEEDS/CALLS FOR ASSIST W/ASSIST/CUES Description: STG Anticipates Needs/Calls for Assist With Cues and Reminders. Outcome: Progressing    Problem: RH PAIN MANAGEMENT Goal: RH STG PAIN MANAGED AT OR BELOW PT'S PAIN GOAL Description: < 3 on a 0-10 pain scale. Outcome: Progressing   Problem: RH KNOWLEDGE DEFICIT BRAIN INJURY Goal: RH STG INCREASE KNOWLEDGE OF SELF CARE AFTER BRAIN INJURY Description: Patient will demonstrate knowledge of self-care management, medication/pain management, skin/wound care management with educational materials and handouts provided by staff independently at discharge. Outcome: Progressing

## 2022-03-28 NOTE — Progress Notes (Addendum)
Occupational Therapy Session Note  Patient Details  Name: Patrick Buchanan MRN: [id number] Date of Birth: [birth year]  Today's Date: 03/28/2022 OT Individual Time: 6286-3817 OT Individual Time Calculation (min): 45 min   Today's Date: 03/28/2022 OT Individual Time: 1305-1400 OT Individual Time Calculation (min): 55 min   Short Term Goals: Week 1:  OT Short Term Goal 1 (Week 1): Pt will sit dynamically at EOB or EOM for 10 min with no more than CGA for balance corrections OT Short Term Goal 1 - Progress (Week 1): Progressing toward goal OT Short Term Goal 2 (Week 1): Pt will complete toilet transfer with MAX A of 1 and LRAD OT Short Term Goal 2 - Progress (Week 1): Met OT Short Term Goal 3 (Week 1): Pt will sit to stand with MOD A Overall in prep for LB and toileting tasks OT Short Term Goal 3 - Progress (Week 1): Met OT Short Term Goal 4 (Week 1): Pt will recall hemi strategies with MIN cuing OT Short Term Goal 4 - Progress (Week 1): Progressing toward goal  Skilled Therapeutic Interventions/Progress Updates:    Session 1: Pt received in bed agreeable to OT and getting dressed as pt has w/c evaluation. Pt completes LB dressing at MAX A Level with MIN A sit to stand in stedy at EOB. Pt with improved sitting balnce at EOB only needing MIN A for reaching forward towards floor to pull up pants towards knee. Pt completes stedy transfer to/from [facility] and EOM. Pt grooms at sink seated with increased time to problem solve steering in close quarters. Pt sits EOM with cuing for hand placement and trunk alignment while ATP measures for custom chair. Pt educated on use of power functions to improve Ind with IADLs and ADLs at home. Exited session with pt seated in EOM with PT and ATP   Session 2:  Focus of session on family education, hands on practice of stedy transfers, review of hemi dressing and use of saebo/positioning for subluxation management. Pt and OT complete bed mobility as well as demo  dressing for daughter at bed and EOB level. Daughters not likely helping with ADLs at home therefore provided written instructions below to give to [facility] to refer to, but demoed techniques for daughters. Pt and daughters did each complete a transfer in a stedy at home to decrease BOC for toileting, transfers, and LB dressing. Pt daugthers need initially max cuing fading to MIN cuing to manage stedy parts, pt balance and LLE onto stedy. Exited session with pt seated in bed, exit alarm on and call light in reach    [facility] Stim One applied to deltoid for subluxation management  330 pulse width 35 Hz pulse rate On 8 sec/ off 8 sec Ramp up/ down 2 sec Symmetrical Biphasic wave form  Max intensity 155m at 500 Ohm load  Handout for caregivers: Patrick Buchanan's Discharge instructions - Bathing- at this time it is not recommended that Patrick Buchanan shower. [facility] Joes sitting balance makes it very unsafe to shower at this time. Please complete bathing at bed level or seated in the power wheelchair with standing in the stedy to wash buttocks/perineal area - Dressing- can complete in a variety of ways o IF completing seated on the edge of the bed you MUST have everything set up and within reach because his balance is not good o Can complete dressing from in the power wheelchair for more back support. Get into the stedy to  transfer into the power wheelchair. Patrick Buchanan will need help putting all his lower body clothing over his feet and then can sit to stand in the stedy for you to help pull it up over his pants. It is always helpful to put the Left leg in first. For putting his shirt on, you can open up the shirt so he can put is Left hand through, pull the sleeve up into his armpit. He can put the shirt over his head, R arm and pull down his trunk. He will often need reminders to pull his shirt over his L shoulder with his R hand. o Can also put pants on at bed level with A to put over his feet  and rolling in both directions. Please DO NOT PULL on his L arm as his shoulder is fragile - Toileting o Patrick Buchanan can use the urinal in the bed or in the wheelchair- the recline feature + wearing pants with a fly will help improve his independence with voiding his bladder o [name] is incontinent of bowel and would benefit from getting on the drop arm commode to improve his bodies ability to fully empty his bowels to decrease accidents. Use the stedy for transferring to the commode and manage his pants up/down. You can leave the stedy around the commode for support, but do not leave Patrick Buchanan on the commode alone because of his poor trunk control - Sitting balance o Patrick Buchanan can sit still without back support for up to 10 min, but if you ask him to put his attention on anything else he is likely to lose his balance if unsupported  o He will often lose his balance to the left as that is his weaker side and may need reminders if he is off balance he needs to correct it.  - Arm management o DO NOT PULL ON THE LEFT ARM o His shoulder has no activation and has a large subluxation ? Use pillows to support under his arm/elbow at night time ? Place his arm on his arm rest on his power wheelchair to help keep his shoulder joint approximated ? Patrick Buchanan can manage his arm onto the stedy since he has grip. He may need reminders to pay attention to it/its positioning. - E-Stim- We have a prescription of a saebo stim one unit provided by the MD and can be placed along the shoulder with the "wings" on the front and the back of the deltoid (has a booklet that can demo positioning) to help stimulate the shoulder muscles - Left inattention- because of Joes brain surgery he does not pay attention to his Left extremities or the L environment so he will be more likely to run into objects on the left and ignore his L arm or leg.  - STEDY use: o If getting out of bed, make sure the stedy is close by before sitting Patrick Buchanan onto the edge of the  bed o Help Patrick Buchanan put his L leg on the platform and he can put his R leg up (watch his balance!) o Roll it underneath him, lock the brakes o He may need a thin pillow between his shins and the front platform o He may need to scoot to the edge of the chair/bed commode to improve mechanics of standing o Have Patrick Buchanan put his L arm up on the bar with his R hand o He needs to lean forward to stand up. Remind him to stand tall and bring his hips forward to move the flaps  to a position where he can sit on them to transfer into the chair/commode or back to bed.  o Make sure when he is sitting in the power wheelchair his hips are not scooted to the R, stand up and readjust as needed.     Therapy Documentation Precautions:  Precautions Precautions: Fall, Other (comment) Precaution Comments: L hemi Required Braces or Orthoses: Other Brace (Wrist splint and PRAFO) Other Brace: Wrist splint and PRAFO Restrictions Weight Bearing Restrictions: No General:     Therapy/Group: Individual Therapy  [name] 03/28/2022, 6:52 AM

## 2022-03-28 NOTE — Patient Care Conference (Signed)
Inpatient RehabilitationTeam Conference and Plan of Care Update Date: 03/28/2022   Time: 10:11 AM    Patient Name: Patrick Buchanan      Medical Record Number: [id number]  Date of Birth: 06-03-1951 Sex: Male         Room/Bed: 4W17C/4W17C-01 Payor Info: Payor: [employer] MEDICARE / Plan: [place] HMO / Product Type: *No Product type* /    Admit Date/Time:  03/07/2022  2:58 PM  Primary Diagnosis:  Meningioma [facility])  Hospital Problems: Principal Problem:   Meningioma [facility])    Expected Discharge Date: Expected Discharge Date: 03/30/22  Team Members Present: Physician leading conference: Dr. Alger Simons Social Worker Present: Loralee Pacas, [place] Nurse Present: Dorthula Nettles, RN PT Present: Apolinar Junes, PT OT Present: Laverle Hobby, OT SLP Present: Weston Anna, SLP PPS Coordinator present : Ileana Ladd, PT     Current Status/Progress Goal Weekly Team Focus  Bowel/Bladder   pt inc. of bm, able to tell that he has gone but unable to notify when he needs to go. lbm 03/28/22 small prn sorbitol administered 03/27/22  continece of b and b  assess q shift and prn   Swallow/Nutrition/ Hydration             ADL's   MIN A dynamic sitting balance + UB dressing, MAX A LB dressing, MAX A toileting/transfers, MIN A sit to stand in stedy  use of stedy with MIN A for functional transfers, MIN A sitting balance and donning of shirt  family/caregiver education, LUE NMR/Estim, midline orientation   Mobility   Mod-max A bed mobility, min A Stedy transfers, mod-max A sit to stand in // bars, decreased trunk and postural control in standing this week, power w/c eval 6/6, supervision w/c propulsion in power w/c >200 ft, limited by L inattention  Mod A w/c level, supervision with power w/c  D/c planning, functional mobility and transfers, power w/c propulsion and management, activity and sitting tolerance, patient/caregiver education   Communication             Safety/Cognition/  Behavioral Observations            Pain   pt c/o 8/10 pain generalized  decrease pain  assess q shift and prn   Skin   bruising on abdomen  remain free form skin breakdown and infection  assess q shift and prn     Discharge Planning:  Pt to d/c to home with support from daughters and hired help. Fam edu completed on Friday (5/26) with pt dtr [name] and next fam edu session on Tuesday (6/6) 1pm-3:30pm with pt daughters Ashely and [name]. [facility]- [facility] for HHPT/OT/Aide. Pt will not have have hired support initially as LTC policy will not assess pt until he is discharged from the home; and will not assist with any DME at this time until assessment completed. Ramp likely not to be built by time of discharge. DME: hospital bed and TTB to be delivered. Family looking for stedy. Trial hoyer lift during family edu requested in the event family unable to obtain stedy initially. Ambulance transport scheduled on day of d/c with [place] at 10am.   Team Discussion: Atrophy in shoulder. Started Linzess. Still constipated. Incontinent B/B, pain is controlled. Incision healed. Encourage fluids, doesn't like water, offer [employer] flavor packs. Family education today. HH in place. Ambulance transport home. Family will need Hoyer lift at some point. Family to purchase Steady.  Patient on target to meet rehab goals: yes, min assist with  Steady, Supervision Power WC. Currently heavy max assist toileting, transfers. Trunk control not consistent. Gets good Estem response. Sabo to shoulder.  *See Care Plan and progress notes for long and short-term goals.   Revisions to Treatment Plan:  Finalizing discharge plans   Teaching Needs: Family education, medication/pain management, bowel/bladder management, skin/wound management, transfer training, etc.   Current Barriers to Discharge: Decreased caregiver support, Home enviroment access/layout, Incontinence, and Lack of/limited family  support  Possible Resolutions to Barriers: Family education Family purchased equipment [employer] provided     Medical Summary Current Status: ongoing spasticity, constipation--linzess added. pain controlled. blood counts are stable  Barriers to Discharge: Medical stability   Possible Resolutions to Barriers/Weekly Focus: adjusting bowel program. daily assessment of labs and pt data   Continued Need for Acute Rehabilitation Level of Care: The patient requires daily medical management by a physician with specialized training in physical medicine and rehabilitation for the following reasons: Direction of a multidisciplinary physical rehabilitation program to maximize functional independence : Yes Medical management of patient stability for increased activity during participation in an intensive rehabilitation regime.: Yes Analysis of laboratory values and/or radiology reports with any subsequent need for medication adjustment and/or medical intervention. : Yes   I attest that I was present, lead the team conference, and concur with the assessment and plan of the team.   Cristi Loron 03/28/2022, 2:05 PM

## 2022-03-28 NOTE — Progress Notes (Signed)
Occupational Therapy Session Note  Patient Details  Name: Patrick Buchanan MRN: [id number] Date of Birth: 1951/03/27  Today's Date: 03/28/2022  03/28/22 1022  OT Individual Time Calculation  OT Individual Start Time 0945  OT Individual Stop Time 1010  OT Individual Time Calculation (min) 25 min  OT Missed Time  OT Amount of Missed Time 20 Minutes  OT Missed Time Reason Nursing care    Short Term Goals: Week 3:  OT Short Term Goal 1 (Week 3): STG=LTG d/t ELOS  Skilled Therapeutic Interventions/Progress Updates:    S: Pt in bed sleeping upon therapy arrival. Increased time was needed to wake patient up. He reports that his left leg and arm do not hurt at the moment as they usually do.   O: - Bed mobility: supine to right sidelying performed with set-up of left arm on bed railing once on side. Required total assist to transition to sidelying. Pillow positioned under left leg to provide proper hip alignment for comfort. Total assist required to return to supine from sidelying.     A: Discussed current deficits with patient regarding his left UE and LE. Pt reports that NMES has been performed to his left side recently. Presents with left shoulder 1 finger subluxation. Full finger flexion/extension demonstrated as patient is able to form a loose fist. Also able to demonstrate functional wrist flexion/extension actively. Partial supination achieved against gravity. No elbow or shoulder movement against gravity. Session was cut short due to nursing care. Nursing interrupted to administer medication and unable to wait until therapy session was finished. Pt was left with nursing in bed with bed alarm re-activated.     P: NM re-ed to LUE, NMES to LUE. Encourage patient from returning to bed when next therapy session is an hour or less away.   Therapy Documentation Precautions:  Precautions Precautions: Fall, Other (comment) Precaution Comments: L hemi Required Braces or Orthoses: Other  Brace (Wrist splint and PRAFO) Other Brace: Wrist splint and PRAFO Restrictions Weight Bearing Restrictions: No  Pain: 0/10 pain reported  Therapy/Group: Individual Therapy  Ailene Ravel, OTR/L,CBIS  Supplemental OT - [facility] and [facility]  03/28/2022, 10:23 AM

## 2022-03-28 NOTE — Progress Notes (Signed)
Patient ID: Patrick Buchanan, male   DOB: [birth year], 71 y.o.   MRN: [id number]  SW ordered hoyer lift and DABSC with [employer] via parachute.   *SW met with pt and pt daughters- Patrick Buchanan and Patrick Buchanan in room to provide updates from team conference, and review discharge. Pt will d/c to home on 6/8 at 10am via [facility]. [facility]- [facility] for HHPT/OT/aide if needed. Patrick Buchanan reports they were able to get [facility] and they will provide private aide care. SW discussed above DME needs as back-up (I.e. hoyer) and family in agreement. States they are in process of ordering a stedy. Family did not receive phone call from vendor before delivery. They will try to plan to be at the home in the event there is no phone call. SW informed will relay to vendor. States TTB delivered here to hospital yesterday, and was taken home already. No other questions concerns at this time.   Loralee Pacas, MSW, [place] Office: [phone] Cell: [phone] Fax: [phone]

## 2022-03-28 NOTE — Progress Notes (Signed)
PROGRESS NOTE   Subjective/Complaints:  Left knee is sore. Says he has history of pseudogout. Feels constipated again but had medium mushy bm last night   ROS: Patient denies fever, rash, sore throat, blurred vision, dizziness, nausea, vomiting, diarrhea, cough, shortness of breath or chest pain, joint or back/neck pain, headache, or mood change.    Objective:   VAS [place] LOWER EXTREMITY VENOUS (DVT)  Result Date: 03/27/2022  Lower Venous DVT Study Patient Name:  Patrick Buchanan  Date of Exam:   03/27/2022 Medical Rec #: [id number]            Accession #:    [id number] Date of Birth: 07/17/1951            Patient Gender: M Patient Age:   71 years Exam Location:  [facility] Procedure:      VAS [place] LOWER EXTREMITY VENOUS (DVT) Referring Phys: Patrick Buchanan --------------------------------------------------------------------------------  Indications: Follow up left PTV DVT, patient reports he is having new left knee pain.  Anticoagulation: Lovenox. Comparison Study: 03-15-3022 Most recent prior left lower extremity DVT study                   showed essentially no change to previously observed left PTV                   DVT. Performing Technologist: Patrick Buchanan RDMS, RVT  Examination Guidelines: A complete evaluation includes B-mode imaging, spectral Doppler, color Doppler, and power Doppler as needed of all accessible portions of each vessel. Bilateral testing is considered an integral part of a complete examination. Limited examinations for reoccurring indications may be performed as noted. The reflux portion of the exam is performed with the patient in reverse Trendelenburg.  +-----+---------------+---------+-----------+----------+--------------+ RIGHTCompressibilityPhasicitySpontaneityPropertiesThrombus Aging +-----+---------------+---------+-----------+----------+--------------+ CFV  Full           Yes      Yes                                  +-----+---------------+---------+-----------+----------+--------------+   +---------+---------------+---------+-----------+----------+--------------+ LEFT     CompressibilityPhasicitySpontaneityPropertiesThrombus Aging +---------+---------------+---------+-----------+----------+--------------+ CFV      Full           Yes      Yes                                 +---------+---------------+---------+-----------+----------+--------------+ SFJ      Full                                                        +---------+---------------+---------+-----------+----------+--------------+ FV Prox  Full                                                        +---------+---------------+---------+-----------+----------+--------------+  FV Mid   Full                                                        +---------+---------------+---------+-----------+----------+--------------+ FV DistalFull                                                        +---------+---------------+---------+-----------+----------+--------------+ PFV      Full                                                        +---------+---------------+---------+-----------+----------+--------------+ POP      Full           Yes      Yes                                 +---------+---------------+---------+-----------+----------+--------------+ PTV      Full                                                        +---------+---------------+---------+-----------+----------+--------------+ PERO     Full                                                        +---------+---------------+---------+-----------+----------+--------------+ Gastroc  Full                                                        +---------+---------------+---------+-----------+----------+--------------+     Summary: RIGHT: - No evidence of common femoral vein obstruction.  LEFT: - Findings appear improved from  previous examination.  - There is no evidence of deep vein thrombosis in the lower extremity.  - Small heterogenous collection observed at the popliteal fossa adjacent to the bony structures. Not appreciated on previous examinations.  *See table(s) above for measurements and observations. Electronically signed by Patrick Buchanan on 03/27/2022 at 7:05:13 PM.    Final    Recent Labs    03/28/22 0530  WBC 4.2  HGB 10.4*  HCT 31.4*  PLT 188      Recent Labs    03/28/22 0530  NA 141  K 4.0  CL 106  CO2 29  GLUCOSE 111*  BUN 14  CREATININE 0.75  CALCIUM 8.7*       Intake/Output Summary (Last 24 hours) at 03/28/2022 1202 Last data filed at 03/28/2022 1036 Gross per 24 hour  Intake 720 ml  Output 650 ml  Net 70 ml  Physical Exam: Vital Signs Blood pressure 107/69, pulse 68, temperature 98 F (36.7 C), temperature source Oral, resp. rate 18, height 6' (1.829 m), weight 92.4 kg, SpO2 95 %. Constitutional: No distress . Vital signs reviewed. HEENT: NCAT, EOMI, oral membranes moist Neck: supple Cardiovascular: RRR without murmur. No JVD    Respiratory/Chest: CTA Bilaterally without wheezes or rales. Normal effort    GI/Abdomen: BS +, non-tender, non-distended Ext: no clubbing, cyanosis, or edema Psych: pleasant and cooperative  Skin: Clean and intact without signs of breakdown, scalp remains incision cdi Neuro:  Alert and oriented x 3. Fair insight and awareness. Intact Memory. Normal language and speech. CN II-XII grossly intact.  LUE 0 shoulder, bicep, tricep, 3+/5 wrist and 3+/5 finger flexors. RUE 4-5/5. LLE with 1 to 1+ /5 HE, KE and trace at foot. RLE 4/5. Left hamstring is tight 2/4 and tender with attempts at stretching. Sensory: intact to LT and pain in all 4 extremities. DTR's 3+ LLE, 2+ LUE. Musculoskeletal: heel cord still  flexible to neutral. Left shoulder subluxed 1/2"--left knee with  medial jt line pain.     Assessment/Plan: 1. Functional deficits which  require 3+ hours per day of interdisciplinary therapy in a comprehensive inpatient rehab setting. Physiatrist is providing close team supervision and 24 hour management of active medical problems listed below. Physiatrist and rehab team continue to assess barriers to discharge/monitor patient progress toward functional and medical goals  Care Tool:  Bathing    Body parts bathed by patient: Left arm, Chest, Abdomen, Front perineal area, Right upper leg, Left upper leg, Face   Body parts bathed by helper: Right arm, Buttocks, Right lower leg, Left lower leg     Bathing assist Assist Level: Maximal Assistance - Patient 24 - 49%     Upper Body Dressing/Undressing Upper body dressing   What is the patient wearing?: Pull over shirt    Upper body assist Assist Level: Moderate Assistance - Patient 50 - 74%    Lower Body Dressing/Undressing Lower body dressing      What is the patient wearing?: Pants     Lower body assist Assist for lower body dressing: Total Assistance - Patient < 25%     Toileting Toileting    Toileting assist Assist for toileting: Total Assistance - Patient < 25% Assistive Device Comment: urinal   Transfers Chair/bed transfer  Transfers assist     Chair/bed transfer assist level: Moderate Assistance - Patient 50 - 74%     Locomotion Ambulation   Ambulation assist      Assist level: 2 helpers Assistive device: Other (comment) (3 Musketeer) Max distance: 60 ft   Walk 10 feet activity   Assist  Walk 10 feet activity did not occur: Safety/medical concerns  Assist level: Moderate Assistance - Patient - 50 - 74% Assistive device: Other (comment) (R rail)   Walk 50 feet activity   Assist Walk 50 feet with 2 turns activity did not occur: Safety/medical concerns  Assist level: 2 helpers Assistive device: Other (comment) (3 Musketeer)    Walk 150 feet activity   Assist Walk 150 feet activity did not occur: Safety/medical concerns          Walk 10 feet on uneven surface  activity   Assist Walk 10 feet on uneven surfaces activity did not occur: Safety/medical concerns         Wheelchair     Assist Is the patient using a wheelchair?: Yes Type of Wheelchair: Power    Wheelchair  assist level: Supervision/Verbal cueing Max wheelchair distance: 60    Wheelchair 50 feet with 2 turns activity    Assist        Assist Level: Supervision/Verbal cueing   Wheelchair 150 feet activity     Assist  Wheelchair 150 feet activity did not occur: Safety/medical concerns   Assist Level: Dependent - Patient 0%   Blood pressure 107/69, pulse 68, temperature 98 F (36.7 C), temperature source Oral, resp. rate 18, height 6' (1.829 m), weight 92.4 kg, SpO2 95 %.  Medical Problem List and Plan: 1. 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             -patient may shower               -ELOS/Goals: 03/30/22          -Continue CIR therapies including PT, OT, and SLP. Interdisciplinary team conference today to discuss goals, barriers to discharge, and dc planning.   2.  Antithrombotics: -DVT/anticoagulation:  pt with left posterior tib dvt.  -lovenox '40mg'$  q12 -re-doppler 5/24 demonstrated persistent left posterior tib dvt -f/u dopplers clear from 6/5--will stop lovenox at discharge from rehab -activity as tolerated             -antiplatelet therapy: none 3. Pain Management: Tylenol, hydrocodone, robaxin as needed             - Gabapentin '100mg'$  BID   -elevation/support of left shoulder for sublux. Harness?   -estim per OT  -add voltaren gel to left knee 4. Mood: team providing emotional support             -antipsychotic agents: n/a             --Depression continue Lexapro 20 mg daily   5. Neuropsych: This patient is capable of making decisions on his own behalf. 6. Skin/Wound Care: Routine skin care checks             -- Monitor surgical  incision 7. Fluids/Electrolytes/Nutrition:    - continue to push fluids  -protein supp for low albumin  I personally reviewed the patient's labs today.   9: History of multiple meningiomas/s/p resection:             -- Continue Keppra and Lamictal             -- Calcium carbonate tablets 3 times daily   -continue above, off steroids currently 10: Dyslipidemia: Continue Crestor 20 mg daily 11: History of GERD: Continue Protonix 40 mg daily 12: DM-2: diet controlled (A1c = 5.7 on 02/22/2022) 13: Anemia/acute intra-op blood loss: follow up hgb a little lower to 10.4 but he has fluctuated between 10-12 during this admission  6/6 hgb holding at 10.4   14. Constipation:    -6/2 results with SMOG enema x 2  -continue linzess    -reduced senna-s  -had bm 6/5--will give fleet enema today 15. Spasticity:   night splint for LLE, tolerating  -continue trial of baclofen '10mg'$  tid, might need further titration  -splinting, rom with therapies   -improved -  16. Hypertension:    -continue magnesium gluconate '250mg'$  HS    03/28/2022    4:32 AM 03/27/2022    7:28 PM 03/27/2022    1:26 PM  Vitals with BMI  Systolic [phone]  Diastolic 69 70 72  Pulse 68 79 79   6/6 controlled    LOS: 21 days A FACE TO FACE  EVALUATION WAS PERFORMED  Patrick Buchanan 03/28/2022, 12:02 PM

## 2022-03-29 NOTE — Progress Notes (Signed)
Occupational Therapy Discharge Summary  Patient Details  Name: Patrick Buchanan MRN: [id number] Date of Birth: 01/06/1951  Today's Date: 03/29/2022 OT Individual Time: 8242-3536 OT Individual Time Calculation (min): 75 min   Session 1:  1:1 Pt received in bed agreeable to OT. Provided handout on bed positioning and UE limb management. Pt completes BADL at EOB with CGA for UB ADLs and MAX A For ADLs at sit to stand in stedy with CGA To power up. Pt completes trasnfer in stedy to [facility]. Focus of remainder of session on finctional mobility in PWC to sink for sink side ADLs as well as to dayroom and  weaving in between cones. Pt L inattention very apparent with 50% of trial hittings cone on L and hitting sink on L when returning to room.    [place] Stim One to L deltoid on 8 clicks with good contraction and on adverse reactions after 1 hour unattended 330 pulse width 35 Hz pulse rate On 8 sec/ off 8 sec Ramp up/ down 2 sec Symmetrical Biphasic wave form  Max intensity 133m at 500 Ohm load   Today's Date: 03/29/2022 OT Individual Time: 11443-1540OT Individual Time Calculation (min): 30 min   Session 2: Pt received in bed requring MOD A to assume EOB with minimal pain stating the voltaren helps his knee and hip. Pt completes bean bag toss in unsupported sitting postiiong position with  CGA-MIN  A overall. Activity performed to improve dynamic balance and functional reach in mod-max ranges outside BOS in prep for BADLs/IADLs. Lateral scooting up to EOB with MIN A and extra facilitation for anterior weight shift for buttock clearance. Exited session with pt seated in bed, exit alarm on and call light in reach    Patient has met 7 of 7 long term goals due to improved activity tolerance, improved balance, postural control, ability to compensate for deficits, functional use of  LEFT upper and LEFT lower extremity, improved attention, improved awareness, and improved coordination.  Patient to discharge  at overall Mod Assist level.  Pts Daugthers present at family education however no hired caregivers present. Provided written instructons for family to provide to caregivers to assist with education, consistency and safety. Daugthers with all questions answered and practiced hands on training of transfers in the stedy with OT and hoyer with PT.    Reasons goals not met: n/a  Recommendation:  Patient will benefit from ongoing skilled OT services in home health setting to continue to advance functional skills in the area of BADL and Reduce care partner burden.  Equipment: DABSC  Reasons for discharge: treatment goals met and discharge from hospital  Patient/family agrees with progress made and goals achieved: Yes  OT Discharge Precautions/Restrictions  Precautions Precautions: Fall;Other (comment) Precaution Comments: L hemi Other Brace: L 2 finger sublux Restrictions Weight Bearing Restrictions: No General   Vital Signs Therapy Vitals Temp: 98.2 F (36.8 C) Temp Source: Oral Pulse Rate: 64 Resp: 17 BP: 115/72 Patient Position (if appropriate): Lying Oxygen Therapy SpO2: 96 % O2 Device: Room Air Pain   ADL ADL Eating: Set up Grooming: Setup Where Assessed-Grooming: Wheelchair Upper Body Bathing: Minimal assistance Where Assessed-Upper Body Bathing: Wheelchair Lower Body Bathing: Maximal assistance Where Assessed-Lower Body Bathing: Wheelchair (stedy) Upper Body Dressing: Contact guard Where Assessed-Upper Body Dressing: Edge of bed Lower Body Dressing: Maximal assistance Where Assessed-Lower Body Dressing: Edge of bed (stedy) Toileting: Maximal assistance Where Assessed-Toileting: Toilet (stedy) Toilet Transfer:  (MIN A sit to stand in stedy) Toilet  Transfer Method: Other (comment) (stedy) Vision Baseline Vision/History: 1 Wears glasses Patient Visual Report: No change from baseline Perception  Perception: Impaired Inattention/Neglect: Does not attend to left  side of body;Impaired-to be further tested in functional context Praxis Praxis: Impaired Praxis Impairment Details: Motor planning Cognition Cognition Overall Cognitive Status: Within Functional Limits for tasks assessed Arousal/Alertness: Awake/alert Orientation Level: Person;Place;Situation Person: Oriented Place: Oriented Situation: Oriented Memory: Impaired Memory Impairment: Decreased recall of new information Selective Attention: Impaired Selective Attention Impairment: Verbal complex;Functional complex Divided Attention: Impaired Awareness: Impaired Awareness Impairment: Anticipatory impairment Safety/Judgment: Appears intact Brief Interview for Mental Status (BIMS) Repetition of Three Words (First Attempt): 3 Temporal Orientation: Year: Correct Temporal Orientation: Month: Accurate within 5 days Temporal Orientation: Day: Correct Recall: "Sock": Yes, no cue required Recall: "Blue": Yes, no cue required Recall: "Bed": Yes, no cue required BIMS Summary Score: 15 Sensation Sensation Light Touch: Appears Intact Proprioception: Impaired Detail Proprioception Impaired Details: Impaired LLE;Impaired LUE Coordination Gross Motor Movements are Fluid and Coordinated: No Fine Motor Movements are Fluid and Coordinated: No Coordination and Movement Description: Dense L hemi with flaccid UE except for wrist and digits Motor  Motor Motor: Abnormal postural alignment and control;Hemiplegia;Abnormal tone Motor - Skilled Clinical Observations: dense L hemi Mobility  Bed Mobility Rolling Right: Maximal Assistance - Patient 25-49% Rolling Left: Contact Guard/Touching assist Supine to Sit: Moderate Assistance - Patient 50-74%;Maximal Assistance - Patient - Patient 25-49% Transfers Sit to Stand: Moderate Assistance - Patient 50-74% Stand to Sit: Moderate Assistance - Patient 50-74%  Trunk/Postural Assessment  Cervical Assessment Cervical Assessment: Exceptions to [facility] Thoracic  Assessment Thoracic Assessment: Exceptions to [facility] Lumbar Assessment Lumbar Assessment: Exceptions to [facility] Postural Control Postural Control: Deficits on evaluation (improved since eval but still delayed)  Balance Balance Balance Assessed: Yes Static Sitting Balance Static Sitting - Level of Assistance: 5: Stand by assistance Dynamic Sitting Balance Dynamic Sitting - Level of Assistance: 5: Stand by assistance;4: Min assist Static Standing Balance Static Standing - Level of Assistance: 3: Mod assist Extremity/Trunk Assessment RUE Assessment RUE Assessment: Within Functional Limits LUE Assessment LUE Assessment: Exceptions to [facility] LUE Body System: Neuro Brunstrum levels for arm and hand: Arm;Hand Brunstrum level for arm: Stage I Presynergy Brunstrum level for hand: Stage VI Isolated joint movements   [name] 03/29/2022, 7:01 AM

## 2022-03-29 NOTE — Progress Notes (Signed)
PROGRESS NOTE   Subjective/Complaints:  Pt reports results with enema yesterday. Pt had large bm last night  ROS: Patient denies fever, rash, sore throat, blurred vision, dizziness, nausea, vomiting, diarrhea, cough, shortness of breath or chest pain,   back/neck pain, headache, or mood change.    Objective:   VAS [place] LOWER EXTREMITY VENOUS (DVT)  Result Date: 03/27/2022  Lower Venous DVT Study Patient Name:  NYQUAN SELBE  Date of Exam:   03/27/2022 Medical Rec #: [id number]            Accession #:    [id number] Date of Birth: 1951-09-20            Patient Gender: M Patient Age:   4 years Exam Location:  [facility] Procedure:      VAS [place] LOWER EXTREMITY VENOUS (DVT) Referring Phys: Alger Simons --------------------------------------------------------------------------------  Indications: Follow up left PTV DVT, patient reports he is having new left knee pain.  Anticoagulation: Lovenox. Comparison Study: 03-15-3022 Most recent prior left lower extremity DVT study                   showed essentially no change to previously observed left PTV                   DVT. Performing Technologist: Darlin Coco RDMS, RVT  Examination Guidelines: A complete evaluation includes B-mode imaging, spectral Doppler, color Doppler, and power Doppler as needed of all accessible portions of each vessel. Bilateral testing is considered an integral part of a complete examination. Limited examinations for reoccurring indications may be performed as noted. The reflux portion of the exam is performed with the patient in reverse Trendelenburg.  +-----+---------------+---------+-----------+----------+--------------+ RIGHTCompressibilityPhasicitySpontaneityPropertiesThrombus Aging +-----+---------------+---------+-----------+----------+--------------+ CFV  Full           Yes      Yes                                  +-----+---------------+---------+-----------+----------+--------------+   +---------+---------------+---------+-----------+----------+--------------+ LEFT     CompressibilityPhasicitySpontaneityPropertiesThrombus Aging +---------+---------------+---------+-----------+----------+--------------+ CFV      Full           Yes      Yes                                 +---------+---------------+---------+-----------+----------+--------------+ SFJ      Full                                                        +---------+---------------+---------+-----------+----------+--------------+ FV Prox  Full                                                        +---------+---------------+---------+-----------+----------+--------------+ FV Mid  Full                                                        +---------+---------------+---------+-----------+----------+--------------+ FV DistalFull                                                        +---------+---------------+---------+-----------+----------+--------------+ PFV      Full                                                        +---------+---------------+---------+-----------+----------+--------------+ POP      Full           Yes      Yes                                 +---------+---------------+---------+-----------+----------+--------------+ PTV      Full                                                        +---------+---------------+---------+-----------+----------+--------------+ PERO     Full                                                        +---------+---------------+---------+-----------+----------+--------------+ Gastroc  Full                                                        +---------+---------------+---------+-----------+----------+--------------+     Summary: RIGHT: - No evidence of common femoral vein obstruction.  LEFT: - Findings appear improved from previous  examination.  - There is no evidence of deep vein thrombosis in the lower extremity.  - Small heterogenous collection observed at the popliteal fossa adjacent to the bony structures. Not appreciated on previous examinations.  *See table(s) above for measurements and observations. Electronically signed by Orlie Pollen on 03/27/2022 at 7:05:13 PM.    Final    Recent Labs    03/28/22 0530  WBC 4.2  HGB 10.4*  HCT 31.4*  PLT 188      Recent Labs    03/28/22 0530  NA 141  K 4.0  CL 106  CO2 29  GLUCOSE 111*  BUN 14  CREATININE 0.75  CALCIUM 8.7*       Intake/Output Summary (Last 24 hours) at 03/29/2022 0904 Last data filed at 03/29/2022 0818 Gross per 24 hour  Intake 360 ml  Output 100 ml  Net 260 ml  Physical Exam: Vital Signs Blood pressure 115/72, pulse 64, temperature 98.2 F (36.8 C), temperature source Oral, resp. rate 17, height 6' (1.829 m), weight 92.4 kg, SpO2 96 %. Constitutional: No distress . Vital signs reviewed. HEENT: NCAT, EOMI, oral membranes moist Neck: supple Cardiovascular: RRR without murmur. No JVD    Respiratory/Chest: CTA Bilaterally without wheezes or rales. Normal effort    GI/Abdomen: BS +, non-tender, non-distended Ext: no clubbing, cyanosis, or edema Psych: pleasant and cooperative  Skin: Clean and intact without signs of breakdown, scalp remains incision cdi Neuro:  Alert and oriented x 3. Fair insight and awareness. Intact Memory. Normal language and speech. CN II-XII grossly intact.  LUE 0 shoulder, bicep, tricep, 3+/5 wrist and 3+/5 finger flexors. RUE 4-5/5. LLE with 1 to 1+ /5 HE, KE and trace at foot. RLE 4/5. Left hamstring is tight 2/4 and tender with attempts at stretching. Sensory: intact to LT and pain in all 4 extremities. DTR's 3+ LLE, 2+ LUE.--no motor changes today Musculoskeletal: heel cord can be ranged to 90. Hamstrings still a little tight on left. .     Assessment/Plan: 1. Functional deficits which require 3+  hours per day of interdisciplinary therapy in a comprehensive inpatient rehab setting. Physiatrist is providing close team supervision and 24 hour management of active medical problems listed below. Physiatrist and rehab team continue to assess barriers to discharge/monitor patient progress toward functional and medical goals  Care Tool:  Bathing    Body parts bathed by patient: Left arm, Chest, Abdomen, Front perineal area, Right upper leg, Left upper leg, Face   Body parts bathed by helper: Right arm, Buttocks, Right lower leg, Left lower leg     Bathing assist Assist Level: Maximal Assistance - Patient 24 - 49%     Upper Body Dressing/Undressing Upper body dressing   What is the patient wearing?: Pull over shirt    Upper body assist Assist Level: Moderate Assistance - Patient 50 - 74%    Lower Body Dressing/Undressing Lower body dressing      What is the patient wearing?: Pants     Lower body assist Assist for lower body dressing: Total Assistance - Patient < 25%     Toileting Toileting    Toileting assist Assist for toileting: Total Assistance - Patient < 25% Assistive Device Comment: urinal   Transfers Chair/bed transfer  Transfers assist     Chair/bed transfer assist level: Moderate Assistance - Patient 50 - 74%     Locomotion Ambulation   Ambulation assist      Assist level: 2 helpers Assistive device: Other (comment) (3 Musketeer) Max distance: 60 ft   Walk 10 feet activity   Assist  Walk 10 feet activity did not occur: Safety/medical concerns  Assist level: Moderate Assistance - Patient - 50 - 74% Assistive device: Other (comment) (R rail)   Walk 50 feet activity   Assist Walk 50 feet with 2 turns activity did not occur: Safety/medical concerns  Assist level: 2 helpers Assistive device: Other (comment) (3 Musketeer)    Walk 150 feet activity   Assist Walk 150 feet activity did not occur: Safety/medical concerns          Walk 10 feet on uneven surface  activity   Assist Walk 10 feet on uneven surfaces activity did not occur: Safety/medical concerns         Wheelchair     Assist Is the patient using a wheelchair?: Yes Type of Wheelchair: Power    Wheelchair  assist level: Supervision/Verbal cueing Max wheelchair distance: 60    Wheelchair 50 feet with 2 turns activity    Assist        Assist Level: Supervision/Verbal cueing   Wheelchair 150 feet activity     Assist  Wheelchair 150 feet activity did not occur: Safety/medical concerns   Assist Level: Dependent - Patient 0%   Blood pressure 115/72, pulse 64, temperature 98.2 F (36.8 C), temperature source Oral, resp. rate 17, height 6' (1.829 m), weight 92.4 kg, SpO2 96 %.  Medical Problem List and Plan: 1. 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             -patient may shower               -ELOS/Goals: 03/30/22          -Continue CIR therapies including PT, OT, and SLP  2.  Antithrombotics: -DVT/anticoagulation:  pt with left posterior tib dvt.  -lovenox '40mg'$  q12 --f/u dopplers clear from 6/5--will stop lovenox at discharge from rehab -activity as tolerated             -antiplatelet therapy: none 3. Pain Management: Tylenol, hydrocodone, robaxin as needed             - Gabapentin '100mg'$  BID   -elevation/support of left shoulder for sublux. [place]?   -estim per OT  -added voltaren gel to left knee 4. Mood: team providing emotional support             -antipsychotic agents: n/a             --Depression continue Lexapro 20 mg daily   5. Neuropsych: This patient is capable of making decisions on his own behalf. 6. Skin/Wound Care: Routine skin care checks             -- Monitor surgical incision 7. Fluids/Electrolytes/Nutrition:    - continue to push fluids  -protein supp for low albumin  I personally reviewed the patient's labs today.   9:  History of multiple meningiomas/s/p resection:             -- Continue Keppra and Lamictal             -- Calcium carbonate tablets 3 times daily   -continue above, off steroids currently 10: Dyslipidemia: Continue Crestor 20 mg daily 11: History of GERD: Continue Protonix 40 mg daily 12: DM-2: diet controlled (A1c = 5.7 on 02/22/2022) 13: Anemia/acute intra-op blood loss: follow up hgb a little lower to 10.4 but he has fluctuated between 10-12 during this admission  6/6 hgb holding at 10.4   14. Constipation:    -6/7 results with fleet enema last night  -continue linzess    -increase senna s to 2 tabs at hs 15. Spasticity:   night splint for LLE, tolerating  -continue trial of baclofen '10mg'$  tid, might need further titration  -splinting, rom with therapies   -improved -  16. Hypertension:    -continue magnesium gluconate '250mg'$  HS    03/29/2022    5:50 AM 03/28/2022    7:59 PM 03/28/2022    2:16 PM  Vitals with BMI  Systolic 086 578 99  Diastolic 72 73 67  Pulse 64 75 84   6/7 controlled    LOS: 22 days A FACE TO FACE EVALUATION WAS PERFORMED  Meredith Staggers 03/29/2022, 9:04 AM

## 2022-03-29 NOTE — Progress Notes (Incomplete)
Inpatient Rehabilitation Discharge Medication Review by a Pharmacist  A complete drug regimen review was completed for this patient to identify any potential clinically significant medication issues.  High Risk Drug Classes Is patient taking? Indication by Medication  Antipsychotic {Receiving?:26196}   Anticoagulant {Receiving?:26196}   Antibiotic {Receiving?:26196}   Opioid {Receiving?:26196}   Antiplatelet {Receiving?:26196}   Hypoglycemics/insulin {Yes or No?:26198}   Vasoactive Medication {Receiving?:26196}   Chemotherapy {Receiving Chemo?:26197}   Other {Yes or No?:26198} Baclofen: muscle spasms Diclofenac gel: topical pain relief Escitalopram: mood Gabapentin: neuropathy/pain Lamotrigine: seizure ppx Levetiracetam: seizure ppx Pantoprazole: GERD ppx Rosuvastatin: HLD     Type of Medication Issue Identified Description of Issue Recommendation(s)  Drug Interaction(s) (clinically significant)     Duplicate Therapy     Allergy     No Medication Administration End Date     Incorrect Dose     Additional Drug Therapy Needed     Significant med changes from prior encounter (inform family/care partners about these prior to discharge).    Other       Clinically significant medication issues were identified that warrant physician communication and completion of prescribed/recommended actions by midnight of the next day:  {Yes or No?:26198}  Name of provider notified for urgent issues identified: ***   Provider Method of Notification: ***    Pharmacist comments: ***   Time spent performing this drug regimen review (minutes): 20   Thank you for allowing pharmacy to be a part of this patient's care.  Ardyth Harps, PharmD Clinical Pharmacist

## 2022-03-29 NOTE — Progress Notes (Signed)
Physical Therapy Discharge Summary  Patient Details  Name: Patrick Buchanan MRN: [id number] Date of Birth: 1951/04/12  Today's Date: 03/30/2022       Patient has met 6 of 7 long term goals due to improved activity tolerance, improved balance, improved postural control, increased strength, increased range of motion, and decreased pain.  Patient to discharge at a wheelchair level [place].   Patient's care partner is independent with instructing patient's care with handouts provided and requires assistance to provide the necessary physical assistance at discharge. Family has arranged for 24/7 caregiver assistance.   Reasons goals not met: Patient with slow progress with therapeutic gait training due to limited motor return of L hemi-body. Patient to perform transfers with [facility] device or [employer] lift at d/c and will use power mobility for locomotion at d/c. Patient and family education on patient's functional limitations for gait and mobility recommendations during family education session.   Recommendation:  Patient will benefit from ongoing skilled PT services in home health setting to continue to advance safe functional mobility, address ongoing impairments in balance, activity tolerance, functional mobility/transfers, power w/c mobility, L hemi-body motor control, postural control, patient/caregiver education, and minimize fall risk.  Equipment: Hospital bed, custom Permobile M3 power wheelchair from [employer], [employer] lift and sling, and self purchased Stedy device  Reasons for discharge: treatment goals met and discharge from hospital  Patient/family agrees with progress made and goals achieved: Yes  PT Discharge Precautions/Restrictions Precautions Precautions: Fall;Other (comment) Precaution Comments: L hemi Other Brace: L 2 finger sublux Restrictions Weight Bearing Restrictions: No Pain Interference Pain Interference Pain Effect on Sleep: 2. Occasionally Pain Interference with  Therapy Activities: 2. Occasionally Pain Interference with Day-to-Day Activities: 2. Occasionally Vision/Perception  Vision - History Ability to See in Adequate Light: 0 Adequate Perception Perception: Impaired Inattention/Neglect: Does not attend to left side of body;Does not attend to left visual field Praxis Praxis: Impaired Praxis Impairment Details: Motor planning  Cognition Overall Cognitive Status: Impaired/Different from baseline Arousal/Alertness: Awake/alert Orientation Level: Oriented X4 Selective Attention: Impaired Selective Attention Impairment: Verbal complex;Functional complex Divided Attention: Impaired Memory: Appears intact Memory Impairment: Decreased recall of new information Awareness: Impaired Awareness Impairment: Anticipatory impairment Safety/Judgment: Appears intact Sensation Sensation Light Touch: Appears Intact Proprioception: Impaired Detail Proprioception Impaired Details: Impaired LLE;Impaired LUE Coordination Gross Motor Movements are Fluid and Coordinated: No Fine Motor Movements are Fluid and Coordinated: No Coordination and Movement Description: Dense L hemi with flaccid UE except for wrist and digits Motor  Motor Motor: Abnormal postural alignment and control;Hemiplegia;Abnormal tone Motor - Skilled Clinical Observations: dense L hemi  Mobility Bed Mobility Rolling Right: Maximal Assistance - Patient 25-49% Rolling Left: Contact Guard/Touching assist Supine to Sit: Moderate Assistance - Patient 50-74%;Maximal Assistance - Patient - Patient 25-49% Sit to Supine: Maximal Assistance - Patient 25-49%;Moderate Assistance - Patient 50-74% Transfers Sit to Stand: Moderate Assistance - Patient 50-74% Stand to Sit: Moderate Assistance - Patient 50-74% Stand Pivot Transfers: Dependent - mechanical lift (min A in Denna Haggard, dependent [employer] lift) Locomotion  Gait Ambulation: Yes Gait Assistance: Maximal Assistance - Patient 25-49%;2  Helpers;Total Assistance - Patient < 25% Gait Distance (Feet): 60 Feet Assistive device: Other (Comment) (3 Musketeer technique with L leg loops) Gait Assistance Details: Manual facilitation for placement;Manual facilitation for weight bearing;Manual facilitation for weight shifting;Verbal cues for precautions/safety;Verbal cues for gait pattern;Verbal cues for technique;Verbal cues for sequencing Gait Assistance Details: 75-100% assist for L limb advancement and L trunk control Gait Gait: Yes Gait Pattern: Step-to pattern;Decreased  stance time - left;Decreased hip/knee flexion - left;Decreased dorsiflexion - left;Decreased weight shift to left;Left flexed knee in stance;Narrow base of support Gait velocity: decreased Stairs / Additional Locomotion Stairs: No Wheelchair Mobility Wheelchair Mobility: Yes Wheelchair Assistance: Chartered loss adjuster: Power (goal post joystick) Wheelchair Parts Management: Needs assistance Distance: >200 ft, needs cues to attend to L side when steering for safety  Trunk/Postural Assessment  Cervical Assessment Cervical Assessment: Exceptions to [facility] (significant forward head posture, hx of cervical tumor resection) Thoracic Assessment Thoracic Assessment: Exceptions to [facility] (significant kyphosis with flexible L convex curve) Lumbar Assessment Lumbar Assessment: Exceptions to [facility] (posterior pelvic tilt with R obliquity) Postural Control Postural Control: Deficits on evaluation (improved since eval but still delayed in sitting with posterior bias)  Balance Balance Balance Assessed: Yes Static Sitting Balance Static Sitting - Balance Support: Feet supported;Right upper extremity supported Static Sitting - Level of Assistance: 5: Stand by assistance Dynamic Sitting Balance Dynamic Sitting - Balance Support: During functional activity;Feet supported;No upper extremity supported Dynamic Sitting - Level of Assistance: 5: Stand by  assistance;4: Min assist Static Standing Balance Static Standing - Balance Support: During functional activity;Right upper extremity supported Static Standing - Level of Assistance: 3: Mod assist Dynamic Standing Balance Dynamic Standing - Balance Support: Bilateral upper extremity supported;During functional activity Dynamic Standing - Level of Assistance: 2: Max assist;1: +2 Total assist Extremity Assessment  RUE Assessment RUE Assessment: Within Functional Limits LUE Assessment LUE Assessment: Exceptions to [facility] LUE Body System: Neuro Brunstrum levels for arm and hand: Arm;Hand Brunstrum level for arm: Stage I Presynergy Brunstrum level for hand: Stage VI Isolated joint movements RLE Assessment RLE Assessment: Within Functional Limits Active Range of Motion (AROM) Comments: WFL for functional mobility General Strength Comments: Grossly at least 4+ to 5/5 with functional mobility LLE Assessment LLE Assessment: Exceptions to [facility] Passive Range of Motion (PROM) Comments: Grossly WFL, L PF tone limiting DF to neutral with over pressure General Strength Comments: minimal volitional quad and adductor activation, otherwise 0/5 volitional movement, active quad, glute, and trace hip flexor activation with transfers LLE Tone LLE Tone: Mild;Hypertonic;Modified Ashworth Body Part - Modified Ashworth Scale: Soleus;Gastrocnemius Gastrocnemius - Modified Ashworth Scale for Grading Hypertonia LLE: Considerable increase in muschle tone, passive movement difficult Soleus - Modified Ashworth Scale for Grading Hypertonia LLE: Considerable increase in muschle tone, passive movement difficult    Cherie L Grunenberg PT, DPT, CBIS  03/29/2022, 8:18 AM

## 2022-03-29 NOTE — Progress Notes (Signed)
Physical Therapy Session Note  Patient Details  Name: Patrick Buchanan MRN: [id number] Date of Birth: 05-12-1951  Today's Date: 03/29/2022 PT Individual Time: 0800-0830 PT Individual Time Calculation (min): 30 min   Short Term Goals:  Week 3:  PT Short Term Goal 1 (Week 3): Patient will perform bed mobility with mod A >75% of the time. PT Short Term Goal 2 (Week 3): Patient will perform Stedy transfers with min A >50% of the time. PT Short Term Goal 3 (Week 3): Patient will propel power w/c >200 feet with only min cues for attention to the L. PT Short Term Goal 4 (Week 3): Patient will be independent with power w/c setting with pressure relief and adjustements for comfort. Week 4:     Skilled Therapeutic Interventions/Progress Updates:  Pt received supine in bed reporting extreme fatigue from lack of sleep over night due to constipation requiring enema to resolve. PT instructed pt in AAROM for LUE/LLE with hand out provided. See pt instruction for information: hip abduction, hip flexion, hip ER/IR, shoulder adduction/adduction, shouler ER/IR, shoulder flexion. Education for 20-30 sec of each. Pt left supine in bed with all needs met.       Therapy Documentation Precautions:  Precautions Precautions: Fall, Other (comment) Precaution Comments: L hemi Required Braces or Orthoses: Other Brace (Wrist splint and PRAFO) Other Brace: L 2 finger sublux Restrictions Weight Bearing Restrictions: No General: PT Amount of Missed Time (min): 15 Minutes PT Missed Treatment Reason: Patient fatigue Vital Signs: Therapy Vitals Temp: 98.2 F (36.8 C) Temp Source: Oral Pulse Rate: 64 Resp: 17 BP: 115/72 Patient Position (if appropriate): Lying Oxygen Therapy SpO2: 96 % O2 Device: Room Air Pain:   Denies.   Therapy/Group: Individual Therapy  Lorie Phenix 03/29/2022, 8:56 AM

## 2022-03-29 NOTE — Progress Notes (Signed)
Physical Therapy Session Note  Patient Details  Name: Patrick Buchanan MRN: [id number] Date of Birth: February 08, 1951  Today's Date: 03/29/2022 PT Individual Time: 1530-1640 PT Individual Time Calculation (min): 70 min   Short Term Goals: Week 3:  PT Short Term Goal 1 (Week 3): Patient will perform bed mobility with mod A >75% of the time. PT Short Term Goal 2 (Week 3): Patient will perform Stedy transfers with min A >50% of the time. PT Short Term Goal 3 (Week 3): Patient will propel power w/c >200 feet with only min cues for attention to the L. PT Short Term Goal 4 (Week 3): Patient will be independent with power w/c setting with pressure relief and adjustements for comfort.  Skilled Therapeutic Interventions/Progress Updates:   Pt received supine in bed and agreeable to PT. Rolling r and L with mod assist to don brief following urination. Supine>sit transfer with mod assist and cues for rolling sequencing on position of the LLE .   PT instructed pt in Grad day assessment to measure progress toward goals. See below for details. CARETool mobility assessment  also completed; see CAREtool tab in navigator for details.  Stedy transfer with min assist and cues for posture and assist to position LUE. Performed x 2 throughout session.   WC mobility in power WC through room, hall way, gym and apartment with cues for safety when moving in reverse .   Seated UE AAROM/PROM for shoulder flexion, adduction, IR/ER x 5 each with 20 sec hold.  Pt returned to room and performed stedy transfer to bed with mod assist. Sit>supine completed with mod assist to control LUE/LLE and left supine in bed with call bell in reach and all needs met.       Therapy Documentation Precautions:  Precautions Precautions: Fall, Other (comment) Precaution Comments: L hemi Required Braces or Orthoses: Other Brace (Wrist splint and PRAFO) Other Brace: L 2 finger sublux Restrictions Weight Bearing Restrictions: No     Vital Signs: Therapy Vitals Temp: 98.2 F (36.8 C) Temp Source: Oral Pulse Rate: 78 Resp: 20 BP: 104/65 Patient Position (if appropriate): Lying Oxygen Therapy SpO2: 96 % O2 Device: Room Air Pain:   Denies      Therapy/Group: Individual Therapy  Lorie Phenix 03/29/2022, 5:13 PM

## 2022-03-30 MED ORDER — VITAMIN D3 125 MCG (5000 UT) PO CAPS: 5000.0000 [IU] | ORAL_CAPSULE | Freq: Every day | ORAL | 0 refills | Status: DC

## 2022-03-30 MED ORDER — LAMOTRIGINE 100 MG PO TABS: 100.0000 mg | ORAL_TABLET | Freq: Two times a day (BID) | ORAL | 0 refills | Status: DC

## 2022-03-30 MED ORDER — PANTOPRAZOLE SODIUM 40 MG PO TBEC: 40.0000 mg | DELAYED_RELEASE_TABLET | Freq: Every day | ORAL | 0 refills | Status: DC

## 2022-03-30 MED ORDER — SENNOSIDES-DOCUSATE SODIUM 8.6-50 MG PO TABS: 2.0000 | ORAL_TABLET | Freq: Every day | ORAL | Status: DC

## 2022-03-30 MED ORDER — HYDROCODONE-ACETAMINOPHEN 5-325 MG PO TABS: 1.0000 | ORAL_TABLET | ORAL | 0 refills | Status: DC | PRN

## 2022-03-30 MED ORDER — CALCIUM CARBONATE ANTACID 500 MG PO CHEW: 1.0000 | CHEWABLE_TABLET | Freq: Three times a day (TID) | ORAL | Status: DC

## 2022-03-30 MED ORDER — ROSUVASTATIN CALCIUM 20 MG PO TABS: 20.0000 mg | ORAL_TABLET | Freq: Every day | ORAL | 3 refills | Status: DC

## 2022-03-30 MED ORDER — LEVETIRACETAM 500 MG PO TABS: 500.0000 mg | ORAL_TABLET | Freq: Two times a day (BID) | ORAL | 0 refills | Status: DC

## 2022-03-30 MED ORDER — VITAMIN D (ERGOCALCIFEROL) 1.25 MG (50000 UNIT) PO CAPS: 50000.0000 [IU] | ORAL_CAPSULE | ORAL | 0 refills | Status: DC

## 2022-03-30 MED ORDER — GABAPENTIN 100 MG PO CAPS: 100.0000 mg | ORAL_CAPSULE | Freq: Two times a day (BID) | ORAL | 0 refills | Status: DC

## 2022-03-30 MED ORDER — DICLOFENAC SODIUM 1 % EX GEL: 2.0000 g | Freq: Three times a day (TID) | CUTANEOUS | 0 refills | Status: DC

## 2022-03-30 MED ORDER — ESCITALOPRAM OXALATE 20 MG PO TABS: 20.0000 mg | ORAL_TABLET | Freq: Every day | ORAL | 0 refills | Status: DC

## 2022-03-30 MED ORDER — BACLOFEN 10 MG PO TABS: 10.0000 mg | ORAL_TABLET | Freq: Three times a day (TID) | ORAL | 0 refills | Status: DC

## 2022-03-30 MED ORDER — MAGNESIUM GLUCONATE 500 MG PO TABS: 250.0000 mg | ORAL_TABLET | Freq: Every day | ORAL | 0 refills | Status: DC

## 2022-03-30 MED ORDER — TRAZODONE HCL 50 MG PO TABS
25.0000 mg | ORAL_TABLET | Freq: Every evening | ORAL | 0 refills | Status: DC | PRN
Start: 2022-03-30 — End: 2022-04-24

## 2022-03-30 MED ORDER — LINACLOTIDE 145 MCG PO CAPS: 145.0000 ug | ORAL_CAPSULE | Freq: Every day | ORAL | 0 refills | Status: DC

## 2022-03-30 NOTE — Progress Notes (Signed)
Inpatient Rehabilitation Discharge Medication Review by a Pharmacist  A complete drug regimen review was completed for this patient to identify any potential clinically significant medication issues.  High Risk Drug Classes Is patient taking? Indication by Medication  Antipsychotic No   Anticoagulant No   Antibiotic No   Opioid Yes Norco: PRN pain  Antiplatelet No   Hypoglycemics/insulin No   Vasoactive Medication No   Chemotherapy No   Other Yes Baclofen: muscle spasms Calcium carbonate, Magonate, MVI, Vit D: supplement Chlorpheniramine: PRN chough Diclofenac gel: topical pain relief Escitalopram: mood Gabapentin: neuropathy/pain Lamotrigine: seizure ppx Levetiracetam: seizure ppx Linaclotide: chronic constipation Pantoprazole: GERD ppx Rosuvastatin: HLD Senokot-S: constipation     Type of Medication Issue Identified Description of Issue Recommendation(s)  Drug Interaction(s) (clinically significant)     Duplicate Therapy     Allergy     No Medication Administration End Date     Incorrect Dose     Additional Drug Therapy Needed     Significant med changes from prior encounter (inform family/care partners about these prior to discharge).    Other       Clinically significant medication issues were identified that warrant physician communication and completion of prescribed/recommended actions by midnight of the next day:  No   Pharmacist comments: n/a   Time spent performing this drug regimen review (minutes): 20   Thank you for allowing pharmacy to be a part of this patient's care.  Ardyth Harps, PharmD Clinical Pharmacist

## 2022-03-30 NOTE — Progress Notes (Signed)
Patient ID: Patrick Buchanan, male   DOB: 06/21/1951, 71 y.o.   MRN: [id number]  SW confirmed with pt dtr [name] that all DME received. States DABSC in room with pt. States her sister [name] is on her way to the hospital. SW shared that [name] will have to take item home.   SW confirmed DABSC in room. SW reviewed d/c with pt. NO questions/concerns reported.   Loralee Pacas, MSW, [place] Office: [phone] Cell: [phone] Fax: [phone]

## 2022-03-30 NOTE — Progress Notes (Signed)
INPATIENT REHABILITATION DISCHARGE NOTE   Discharge instructions by: Katharine Look, PA  Verbalized understanding: yes  Skin care/Wound care healing? No wounds  Pain: no pain  IV's: none  Tubes/Drains: none  O2: room air  Safety instructions: fall precautions   Patient belongings: gathered and given to daughter.   Discharged to: home  Discharged via: [facility]  Notes:

## 2022-03-30 NOTE — Progress Notes (Signed)
Inpatient Rehabilitation Care Coordinator Discharge Note   Patient Details  Name: Patrick Buchanan MRN: [id number] Date of Birth: 28-Oct-1950   Discharge location: D/c to home with support from daughters and private aide care  Length of Stay: 22 days  Discharge activity level: Mod A to Max A  Home/community participation: Limited  Patient response YC:XKGYJE Literacy - How often do you need to have someone help you when you read instructions, pamphlets, or other written material from your doctor or pharmacy?: Never  Patient response HU:DJSHFW Isolation - How often do you feel lonely or isolated from those around you?: Sometimes  Services provided included: MD, RD, PT, OT, SLP, CM, RN, TR, Pharmacy, Neuropsych, SW  Financial Services:  Charity fundraiser Utilized: [place] [employer]  Choices offered to/list presented to: Yes  Follow-up services arranged:  Home Health, DME, Other (Comment), Patient/Family request agency HH/DME (family hired Building services engineer care through [employer]) [place]: [facility] PT/OT/aide    DME : [place] for hoyer lift, hosipital bed, DABSC, tub transfer bench; [employer] w/c to be delivered to the home. HH/DME Requested Agency: [facility]  Patient response to transportation need: Is the patient able to respond to transportation needs?: Yes In the past 12 months, has lack of transportation kept you from medical appointments or from getting medications?: No In the past 12 months, has lack of transportation kept you from meetings, work, or from getting things needed for daily living?: No   Comments (or additional information):  Patient/Family verbalized understanding of follow-up arrangements:  Yes  Individual responsible for coordination of the follow-up plan: contact pt  Confirmed correct DME delivered: Rana Snare 03/30/2022    Rana Snare

## 2022-03-30 NOTE — Progress Notes (Addendum)
PROGRESS NOTE   Subjective/Complaints:  Pt in good spirits. No further bm's during the day. Asked about bowel plan at discharge  ROS: Patient denies fever, rash, sore throat, blurred vision, dizziness, nausea, vomiting, diarrhea, cough, shortness of breath or chest pain, joint or back/neck pain, headache, or mood change.    Objective:   No results found. Recent Labs    03/28/22 0530  WBC 4.2  HGB 10.4*  HCT 31.4*  PLT 188      Recent Labs    03/28/22 0530  NA 141  K 4.0  CL 106  CO2 29  GLUCOSE 111*  BUN 14  CREATININE 0.75  CALCIUM 8.7*       Intake/Output Summary (Last 24 hours) at 03/30/2022 0913 Last data filed at 03/30/2022 0018 Gross per 24 hour  Intake 480 ml  Output 700 ml  Net -220 ml        Physical Exam: Vital Signs Blood pressure 116/63, pulse 62, temperature 98.2 F (36.8 C), resp. rate 16, height 6' (1.829 m), weight 92.4 kg, SpO2 96 %. Constitutional: No distress . Vital signs reviewed. HEENT: NCAT, EOMI, oral membranes moist Neck: supple Cardiovascular: RRR without murmur. No JVD    Respiratory/Chest: CTA Bilaterally without wheezes or rales. Normal effort    GI/Abdomen: BS +, non-tender, non-distended Ext: no clubbing, cyanosis, or edema Psych: pleasant and cooperative  Skin: Clean and intact without signs of breakdown, scalp remains incision cdi Neuro:  Alert and oriented x 3. Fair insight and awareness. Intact Memory. Normal language and speech. CN II-XII grossly intact.  LUE 0 shoulder, bicep, tricep, 3+/5 wrist and 3+/5 finger flexors. RUE 4-5/5. LLE with 1 to 1+ /5 HE, KE and trace at foot. RLE 4/5. Left hamstring is tight 1-2/4. Sensory: intact to LT and pain in all 4 extremities. DTR's 3+ LLE, 2+ LUE.--stable motor exam Musculoskeletal: heel cord can be ranged easily to 90. Left shoulder sublux     Assessment/Plan: 1. Functional deficits which require 3+ hours per day of  interdisciplinary therapy in a comprehensive inpatient rehab setting. Physiatrist is providing close team supervision and 24 hour management of active medical problems listed below. Physiatrist and rehab team continue to assess barriers to discharge/monitor patient progress toward functional and medical goals  Care Tool:  Bathing    Body parts bathed by patient: Left arm, Chest, Abdomen, Front perineal area, Right upper leg, Left upper leg, Face   Body parts bathed by helper: Right arm, Buttocks, Right lower leg, Left lower leg     Bathing assist Assist Level: Moderate Assistance - Patient 50 - 74%     Upper Body Dressing/Undressing Upper body dressing   What is the patient wearing?: Pull over shirt    Upper body assist Assist Level: Contact Guard/Touching assist    Lower Body Dressing/Undressing Lower body dressing      What is the patient wearing?: Pants     Lower body assist Assist for lower body dressing: Maximal Assistance - Patient 25 - 49%     Toileting Toileting    Toileting assist Assist for toileting: Maximal Assistance - Patient 25 - 49% Assistive Device Comment: urinal   Transfers  Chair/bed transfer  Transfers assist     Chair/bed transfer assist level: Moderate Assistance - Patient 50 - 74%     Locomotion Ambulation   Ambulation assist      Assist level: 2 helpers Assistive device: Other (comment) Max distance: 30   Walk 10 feet activity   Assist  Walk 10 feet activity did not occur: Safety/medical concerns  Assist level: 2 helpers Assistive device: Other (comment)   Walk 50 feet activity   Assist Walk 50 feet with 2 turns activity did not occur: Safety/medical concerns  Assist level: 2 helpers Assistive device: Other (comment) (3 Musketeer)    Walk 150 feet activity   Assist Walk 150 feet activity did not occur: Safety/medical concerns         Walk 10 feet on uneven surface  activity   Assist Walk 10 feet on  uneven surfaces activity did not occur: Safety/medical concerns         Wheelchair     Assist Is the patient using a wheelchair?: Yes Type of Wheelchair: Power    Wheelchair assist level: Supervision/Verbal cueing Max wheelchair distance: 243f    Wheelchair 50 feet with 2 turns activity    Assist        Assist Level: Supervision/Verbal cueing   Wheelchair 150 feet activity     Assist  Wheelchair 150 feet activity did not occur: Safety/medical concerns   Assist Level: Supervision/Verbal cueing   Blood pressure 116/63, pulse 62, temperature 98.2 F (36.8 C), resp. rate 16, height 6' (1.829 m), weight 92.4 kg, SpO2 96 %.  Medical Problem List and Plan: 1. 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             -dc home today  -A fully electric hospital bed has been ordered for home. The patient requires a fully electric hospital bed that offers variable height as he requires frequent repositioning and transfers from chair, wheelchair, or standing position that can not be achieved with a fixed-height hospital bed  -f/u with [facility], ns, primary as outpt 2.  Antithrombotics: -DVT/anticoagulation:  pt with left posterior tib dvt.  -lovenox '40mg'$  q12 --f/u dopplers clear from 6/5-- stop lovenox at discharge from rehab -activity as tolerated             -antiplatelet therapy: none 3. Pain Management: Tylenol, hydrocodone, robaxin as needed             - Gabapentin '100mg'$  BID   -elevation/support of left shoulder for sublux. Harness?   -estim per OT  continue voltaren gel to left knee 4. Mood: team providing emotional support             -antipsychotic agents: n/a             --Depression continue Lexapro 20 mg daily   5. Neuropsych: This patient is capable of making decisions on his own behalf. 6. Skin/Wound Care: Routine skin care checks             -- Monitor surgical incision 7.  Fluids/Electrolytes/Nutrition:    - continue to push fluids  -protein supp for low albumin  Pt eating fairly well  9: History of multiple meningiomas/s/p resection:             -- Continue Keppra and Lamictal             -- Calcium carbonate tablets 3 times daily   -continue above, off steroids currently 10: Dyslipidemia:  Continue Crestor 20 mg daily 11: History of GERD: Continue Protonix 40 mg daily 12: DM-2: diet controlled (A1c = 5.7 on 02/22/2022) 13: Anemia/acute intra-op blood loss: follow up hgb a little lower to 10.4 but he has fluctuated between 10-12 during this admission  6/6 hgb holding at 10.4   14. Constipation:    -6/7 results with fleet enema    -continue linzess    -increased senna s to 2 tabs at hs 15. Spasticity:   night splint for LLE, tolerating  -  baclofen '10mg'$  tid   -splinting, rom with therapies   -outpt f/u to discuss further -  16. Hypertension:    -continue magnesium gluconate '250mg'$  HS    03/30/2022    4:25 AM 03/29/2022    7:33 PM 03/29/2022    1:43 PM  Vitals with BMI  Systolic [phone]  Diastolic 63 68 65  Pulse 62 75 78   6/8 controlled    LOS: 23 days A FACE TO FACE EVALUATION WAS PERFORMED  Meredith Staggers 03/30/2022, 9:13 AM

## 2022-04-01 ENCOUNTER — Emergency Department (HOSPITAL_COMMUNITY)
Admission: EM | Admit: 2022-04-01 | Discharge: 2022-04-02 | Disposition: A | Discharge status: Home / Self Care | Payer: Medicare HMO | Attending: Emergency Medicine | Admitting: Emergency Medicine

## 2022-04-01 ENCOUNTER — Other Ambulatory Visit: Payer: Self-pay

## 2022-04-01 ENCOUNTER — Encounter (HOSPITAL_COMMUNITY): Payer: Self-pay

## 2022-04-01 DIAGNOSIS — K5900 Constipation, unspecified: Secondary | ICD-10-CM | POA: Insufficient documentation

## 2022-04-01 DIAGNOSIS — R339 Retention of urine, unspecified: Secondary | ICD-10-CM | POA: Insufficient documentation

## 2022-04-01 DIAGNOSIS — I6522 Occlusion and stenosis of left carotid artery: Secondary | ICD-10-CM | POA: Diagnosis not present

## 2022-04-01 DIAGNOSIS — I6781 Acute cerebrovascular insufficiency: Secondary | ICD-10-CM | POA: Diagnosis not present

## 2022-04-01 DIAGNOSIS — R1084 Generalized abdominal pain: Secondary | ICD-10-CM | POA: Diagnosis not present

## 2022-04-01 LAB — COMPREHENSIVE METABOLIC PANEL
ALT: 35 U/L (ref 0–44)
AST: 25 U/L (ref 15–41)
Albumin: 3.1 g/dL — ABNORMAL LOW (ref 3.5–5.0)
Alkaline Phosphatase: 87 U/L (ref 38–126)
Anion gap: 10 (ref 5–15)
BUN: 13 mg/dL (ref 8–23)
CO2: 32 mmol/L (ref 22–32)
Calcium: 9 mg/dL (ref 8.9–10.3)
Chloride: 101 mmol/L (ref 98–111)
Creatinine, Ser: 0.74 mg/dL (ref 0.61–1.24)
GFR, Estimated: >60 mL/min (ref >60)
Glucose, Bld: 123 mg/dL — ABNORMAL HIGH (ref 70–99)
Potassium: 4.4 mmol/L (ref 3.5–5.1)
Sodium: 143 mmol/L (ref 135–145)
Total Bilirubin: 0.7 mg/dL (ref 0.3–1.2)
Total Protein: 6.1 g/dL — ABNORMAL LOW (ref 6.5–8.1)

## 2022-04-01 LAB — CBC WITH DIFFERENTIAL/PLATELET
Abs Immature Granulocytes: 0.05 10*3/uL (ref 0.00–0.07)
Basophils Absolute: 0 10*3/uL (ref 0.0–0.1)
Basophils Relative: 0 %
Eosinophils Absolute: 0.1 10*3/uL (ref 0.0–0.5)
Eosinophils Relative: 1 %
HCT: 36.3 % — ABNORMAL LOW (ref 39.0–52.0)
Hemoglobin: 11.3 g/dL — ABNORMAL LOW (ref 13.0–17.0)
Immature Granulocytes: 1 %
Lymphocytes Relative: 16 %
Lymphs Abs: 0.8 10*3/uL (ref 0.7–4.0)
MCH: 30.2 pg (ref 26.0–34.0)
MCHC: 31.1 g/dL (ref 30.0–36.0)
MCV: 97.1 fL (ref 80.0–100.0)
Monocytes Absolute: 0.4 10*3/uL (ref 0.1–1.0)
Monocytes Relative: 8 %
Neutro Abs: 3.7 10*3/uL (ref 1.7–7.7)
Neutrophils Relative %: 74 %
Platelets: 302 10*3/uL (ref 150–400)
RBC: 3.74 MIL/uL — ABNORMAL LOW (ref 4.22–5.81)
RDW: 15.6 % — ABNORMAL HIGH (ref 11.5–15.5)
WBC: 5.1 10*3/uL (ref 4.0–10.5)
nRBC: 0 % (ref 0.0–0.2)

## 2022-04-01 LAB — URINALYSIS, ROUTINE W REFLEX MICROSCOPIC
Bilirubin Urine: NEGATIVE
Glucose, UA: NEGATIVE mg/dL
Hgb urine dipstick: NEGATIVE
Ketones, ur: NEGATIVE mg/dL
Leukocytes,Ua: NEGATIVE
Nitrite: NEGATIVE
Protein, ur: NEGATIVE mg/dL
Specific Gravity, Urine: 1.013 (ref 1.005–1.030)
pH: 9 — ABNORMAL HIGH (ref 5.0–8.0)

## 2022-04-01 LAB — LIPASE, BLOOD: Lipase: 35 U/L (ref 11–51)

## 2022-04-01 MED ORDER — MAGNESIUM HYDROXIDE 400 MG/5ML PO SUSP
960.0000 mL | Freq: Once | ORAL | Status: AC
  Administered 2022-04-01: 960 mL via RECTAL
  Filled 2022-04-01: qty 473

## 2022-04-01 MED ORDER — TETANUS-DIPHTH-ACELL PERTUSSIS 5-2.5-18.5 LF-MCG/0.5 IM SUSY: 0.5000 mL | PREFILLED_SYRINGE | Freq: Once | INTRAMUSCULAR | Status: DC

## 2022-04-01 MED ORDER — HYDROMORPHONE HCL 1 MG/ML IJ SOLN: 1.0000 mg | Freq: Once | INTRAMUSCULAR | Status: DC

## 2022-04-01 NOTE — ED Notes (Addendum)
Pharmacy notified of the need for the enema to be sent to [facility]. Patient and family verbalize the understanding of the delay

## 2022-04-01 NOTE — ED Provider Notes (Addendum)
[facility] EMERGENCY DEPARTMENT Provider Note   CSN: [id number] Arrival date & time: 04/01/22  1224     History  Chief Complaint  Patient presents with   Constipation   Urinary Retention    Patrick Buchanan is a 71 y.o. male.  HPI He presents for evaluation of constipation for several days unable to have a stool, and inability to void urine since yesterday.    Home Medications Prior to Admission medications   Medication Sig Start Date End Date Taking? Authorizing Provider  baclofen (LIORESAL) 10 MG tablet Take 1 tablet (10 mg total) by mouth 3 (three) times daily. 03/30/22   Setzer, Edman Circle, PA-C  calcium carbonate (TUMS - DOSED IN MG ELEMENTAL CALCIUM) 500 MG chewable tablet Chew 1 tablet (200 mg of elemental calcium total) by mouth 3 (three) times daily with meals. 03/30/22   Setzer, Edman Circle, PA-C  chlorpheniramine (CHLOR-TRIMETON) 4 MG tablet Take 4 mg by mouth 2 (two) times daily as needed for allergies.    [provider]  Cholecalciferol (VITAMIN D3) 125 MCG (5000 UT) CAPS Take 1 capsule (5,000 Units total) by mouth daily. 03/30/22   Setzer, Edman Circle, PA-C  diclofenac Sodium (VOLTAREN) 1 % GEL Apply 2 g topically 3 (three) times daily. 03/30/22   Setzer, Edman Circle, PA-C  escitalopram (LEXAPRO) 20 MG tablet Take 1 tablet (20 mg total) by mouth daily. 03/30/22   Setzer, Edman Circle, PA-C  gabapentin (NEURONTIN) 100 MG capsule Take 1 capsule (100 mg total) by mouth 2 (two) times daily. 03/30/22   Setzer, Edman Circle, PA-C  HYDROcodone-acetaminophen (NORCO/VICODIN) 5-325 MG tablet Take 1 tablet by mouth every 4 (four) hours as needed for moderate pain. 03/30/22   Setzer, Edman Circle, PA-C  lamoTRIgine (LAMICTAL) 100 MG tablet Take 1 tablet (100 mg total) by mouth 2 (two) times daily. 03/30/22   Setzer, Edman Circle, PA-C  levETIRAcetam (KEPPRA) 500 MG tablet Take 1 tablet (500 mg total) by mouth 2 (two) times daily. 03/30/22   Setzer, Edman Circle, PA-C  linaclotide [facility]) 145  MCG CAPS capsule Take 1 capsule (145 mcg total) by mouth daily before breakfast. 03/31/22   Setzer, Edman Circle, PA-C  magnesium gluconate (MAGONATE) 500 MG tablet Take 0.5 tablets (250 mg total) by mouth at bedtime. 03/30/22   Setzer, Edman Circle, PA-C  Multiple Vitamin (MULTIVITAMIN) tablet Take 1 tablet by mouth daily.    [provider]  pantoprazole (PROTONIX) 40 MG tablet Take 1 tablet (40 mg total) by mouth at bedtime. 03/30/22   Setzer, Edman Circle, PA-C  polyvinyl alcohol (LIQUIFILM TEARS) 1.4 % ophthalmic solution Place 1 drop into both eyes as needed for dry eyes.    [provider]  rosuvastatin (CRESTOR) 20 MG tablet Take 1 tablet (20 mg total) by mouth daily. 03/30/22   Setzer, Edman Circle, PA-C  senna-docusate (SENOKOT-S) 8.6-50 MG tablet Take 2 tablets by mouth at bedtime. 03/30/22   Setzer, Edman Circle, PA-C  traZODone (DESYREL) 50 MG tablet Take 0.5-1 tablets (25-50 mg total) by mouth at bedtime as needed for sleep. 03/30/22   Setzer, Edman Circle, PA-C  Vitamin D, Ergocalciferol, (DRISDOL) 1.25 MG (50000 UNIT) CAPS capsule Take 1 capsule (50,000 Units total) by mouth every Sunday. Take one tablet wkly 04/02/22   Setzer, Edman Circle, PA-C      Allergies    Other    Review of Systems   Review of Systems  Physical Exam Updated Vital Signs BP 112/79   Pulse  93   Temp 98.6 F (37 C) (Oral)   Resp 12   SpO2 94%  Physical Exam Vitals and nursing note reviewed.  Constitutional:      General: He is not in acute distress.    Appearance: He is well-developed. He is not ill-appearing or diaphoretic.  HENT:     Head: Normocephalic and atraumatic.     Right Ear: External ear normal.     Left Ear: External ear normal.  Eyes:     Conjunctiva/sclera: Conjunctivae normal.     Pupils: Pupils are equal, round, and reactive to light.  Neck:     Trachea: Phonation normal.  Cardiovascular:     Rate and Rhythm: Normal rate and regular rhythm.     Heart sounds: Normal heart sounds.  Pulmonary:      Effort: Pulmonary effort is normal. No respiratory distress.     Breath sounds: Normal breath sounds. No stridor.  Abdominal:     General: There is distension.     Palpations: Abdomen is soft.     Tenderness: There is no abdominal tenderness.  Musculoskeletal:        General: Normal range of motion.     Cervical back: Normal range of motion and neck supple.  Skin:    General: Skin is warm and dry.  Neurological:     Mental Status: He is alert and oriented to person, place, and time.     Sensory: No sensory deficit.     Motor: No abnormal muscle tone.     Coordination: Coordination normal.  Psychiatric:        Mood and Affect: Mood normal.        Behavior: Behavior normal.     ED Results / Procedures / Treatments   Labs (all labs ordered are listed, but only abnormal results are displayed) Labs Reviewed  CBC WITH DIFFERENTIAL/PLATELET - Abnormal; Notable for the following components:      Result Value   RBC 3.74 (*)    Hemoglobin 11.3 (*)    HCT 36.3 (*)    RDW 15.6 (*)    All other components within normal limits  COMPREHENSIVE METABOLIC PANEL - Abnormal; Notable for the following components:   Glucose, Bld 123 (*)    Total Protein 6.1 (*)    Albumin 3.1 (*)    All other components within normal limits  URINALYSIS, ROUTINE W REFLEX MICROSCOPIC - Abnormal; Notable for the following components:   APPearance HAZY (*)    pH 9.0 (*)    All other components within normal limits  URINE CULTURE  LIPASE, BLOOD    EKG None  Radiology No results found.  Procedures Procedures    Medications Ordered in ED Medications  sorbitol, milk of mag, mineral oil, glycerin (SMOG) enema (has no administration in time range)    ED Course/ Medical Decision Making/ A&P                           Medical Decision Making Patient presenting with ongoing constipation, recently started on Linzess.  More recently he has been unable to urinate since yesterday.  He presents with bladder  distention.  Bladder scan at the urinary bladder was greater than 500 cc.  Foley catheter placed with recovery of the urine.  Enema ordered to treat constipation  Problems Addressed: Constipation, unspecified constipation type: chronic illness or injury    Details: Treated with smog enema in the ED. Urinary retention: acute  illness or injury    Details: Likely secondary to constipation  Amount and/or Complexity of Data Reviewed Independent Historian:     Details: He is a cogent historian Labs: ordered.    Details: CBC, metabolic panel, lipase, urinalysis-normal except glucose high, protein low, hemoglobin low  Risk Decision regarding hospitalization. Risk Details: Patient presenting with constipation and urinary retention.  Urinary bladder scan with greater than 500 cc of urine.  Foley catheter placed with recovery of the urine.  Constipation addressed with smog enema with some stooling prior to discharge.  He is instructed to continue Linzess, and MiraLAX.  He is instructed to follow-up with his PCP for ongoing management.  He does not require hospital management at this time           Final Clinical Impression(s) / ED Diagnoses Final diagnoses:  Urinary retention  Constipation, unspecified constipation type    Rx / DC Orders ED Discharge Orders     None         Daleen Bo, MD 04/01/22 1749    Daleen Bo, MD 04/02/22 1440

## 2022-04-01 NOTE — ED Provider Triage Note (Signed)
Emergency Medicine Provider Triage Evaluation Note  RICKY GALLERY , a 71 y.o. male  was evaluated in triage.  Pt complains of abdominal pain x yesterday.  Left the hospital yesterday, has not had a bowel movement x 3 days.  Endorses significant lower abdominal pressure, and significant pain as he has not been able to void since yesterday.  He is currently not passing any gas.  No nausea, vomiting.  Review of Systems  Positive: Urinary retention, abdominal pain Negative: Fever, nausea, vomiting  Physical Exam  BP 104/69   Pulse 76   Temp 98.6 F (37 C) (Oral)   Resp 18   SpO2 96%  Gen:   Awake, no distress   Resp:  Normal effort  MSK:   Moves extremities without difficulty  Other:    Medical Decision Making  Medically screening exam initiated at 12:46 PM.  Appropriate orders placed.  CHALES PELISSIER was informed that the remainder of the evaluation will be completed by another provider, this initial triage assessment does not replace that evaluation, and the importance of remaining in the ED until their evaluation is complete.  Patient here unable to void for the last day complaining of significant lower abdominal pressure.  Will order bladder scan.   Janeece Fitting, PA-C 04/01/22 1250

## 2022-04-01 NOTE — ED Provider Notes (Incomplete Revision)
[facility] EMERGENCY DEPARTMENT Provider Note   CSN: [id number] Arrival date & time: 04/01/22  1224     History  Chief Complaint  Patient presents with   Constipation   Urinary Retention    Patrick Buchanan is a 71 y.o. male.  HPI He presents for evaluation of constipation for several days unable to have a stool, and inability to void urine since yesterday.    Home Medications Prior to Admission medications   Medication Sig Start Date End Date Taking? Authorizing Provider  baclofen (LIORESAL) 10 MG tablet Take 1 tablet (10 mg total) by mouth 3 (three) times daily. 03/30/22   Setzer, Edman Circle, PA-C  calcium carbonate (TUMS - DOSED IN MG ELEMENTAL CALCIUM) 500 MG chewable tablet Chew 1 tablet (200 mg of elemental calcium total) by mouth 3 (three) times daily with meals. 03/30/22   Setzer, Edman Circle, PA-C  chlorpheniramine (CHLOR-TRIMETON) 4 MG tablet Take 4 mg by mouth 2 (two) times daily as needed for allergies.    [provider]  Cholecalciferol (VITAMIN D3) 125 MCG (5000 UT) CAPS Take 1 capsule (5,000 Units total) by mouth daily. 03/30/22   Setzer, Edman Circle, PA-C  diclofenac Sodium (VOLTAREN) 1 % GEL Apply 2 g topically 3 (three) times daily. 03/30/22   Setzer, Edman Circle, PA-C  escitalopram (LEXAPRO) 20 MG tablet Take 1 tablet (20 mg total) by mouth daily. 03/30/22   Setzer, Edman Circle, PA-C  gabapentin (NEURONTIN) 100 MG capsule Take 1 capsule (100 mg total) by mouth 2 (two) times daily. 03/30/22   Setzer, Edman Circle, PA-C  HYDROcodone-acetaminophen (NORCO/VICODIN) 5-325 MG tablet Take 1 tablet by mouth every 4 (four) hours as needed for moderate pain. 03/30/22   Setzer, Edman Circle, PA-C  lamoTRIgine (LAMICTAL) 100 MG tablet Take 1 tablet (100 mg total) by mouth 2 (two) times daily. 03/30/22   Setzer, Edman Circle, PA-C  levETIRAcetam (KEPPRA) 500 MG tablet Take 1 tablet (500 mg total) by mouth 2 (two) times daily. 03/30/22   Setzer, Edman Circle, PA-C  linaclotide [facility]) 145  MCG CAPS capsule Take 1 capsule (145 mcg total) by mouth daily before breakfast. 03/31/22   Setzer, Edman Circle, PA-C  magnesium gluconate (MAGONATE) 500 MG tablet Take 0.5 tablets (250 mg total) by mouth at bedtime. 03/30/22   Setzer, Edman Circle, PA-C  Multiple Vitamin (MULTIVITAMIN) tablet Take 1 tablet by mouth daily.    [provider]  pantoprazole (PROTONIX) 40 MG tablet Take 1 tablet (40 mg total) by mouth at bedtime. 03/30/22   Setzer, Edman Circle, PA-C  polyvinyl alcohol (LIQUIFILM TEARS) 1.4 % ophthalmic solution Place 1 drop into both eyes as needed for dry eyes.    [provider]  rosuvastatin (CRESTOR) 20 MG tablet Take 1 tablet (20 mg total) by mouth daily. 03/30/22   Setzer, Edman Circle, PA-C  senna-docusate (SENOKOT-S) 8.6-50 MG tablet Take 2 tablets by mouth at bedtime. 03/30/22   Setzer, Edman Circle, PA-C  traZODone (DESYREL) 50 MG tablet Take 0.5-1 tablets (25-50 mg total) by mouth at bedtime as needed for sleep. 03/30/22   Setzer, Edman Circle, PA-C  Vitamin D, Ergocalciferol, (DRISDOL) 1.25 MG (50000 UNIT) CAPS capsule Take 1 capsule (50,000 Units total) by mouth every Sunday. Take one tablet wkly 04/02/22   Setzer, Edman Circle, PA-C      Allergies    Other    Review of Systems   Review of Systems  Physical Exam Updated Vital Signs BP 112/79   Pulse  93   Temp 98.6 F (37 C) (Oral)   Resp 12   SpO2 94%  Physical Exam Vitals and nursing note reviewed.  Constitutional:      General: He is not in acute distress.    Appearance: He is well-developed. He is not ill-appearing or diaphoretic.  HENT:     Head: Normocephalic and atraumatic.     Right Ear: External ear normal.     Left Ear: External ear normal.  Eyes:     Conjunctiva/sclera: Conjunctivae normal.     Pupils: Pupils are equal, round, and reactive to light.  Neck:     Trachea: Phonation normal.  Cardiovascular:     Rate and Rhythm: Normal rate and regular rhythm.     Heart sounds: Normal heart sounds.  Pulmonary:      Effort: Pulmonary effort is normal. No respiratory distress.     Breath sounds: Normal breath sounds. No stridor.  Abdominal:     General: There is distension.     Palpations: Abdomen is soft.     Tenderness: There is no abdominal tenderness.  Musculoskeletal:        General: Normal range of motion.     Cervical back: Normal range of motion and neck supple.  Skin:    General: Skin is warm and dry.  Neurological:     Mental Status: He is alert and oriented to person, place, and time.     Sensory: No sensory deficit.     Motor: No abnormal muscle tone.     Coordination: Coordination normal.  Psychiatric:        Mood and Affect: Mood normal.        Behavior: Behavior normal.     ED Results / Procedures / Treatments   Labs (all labs ordered are listed, but only abnormal results are displayed) Labs Reviewed  CBC WITH DIFFERENTIAL/PLATELET - Abnormal; Notable for the following components:      Result Value   RBC 3.74 (*)    Hemoglobin 11.3 (*)    HCT 36.3 (*)    RDW 15.6 (*)    All other components within normal limits  COMPREHENSIVE METABOLIC PANEL - Abnormal; Notable for the following components:   Glucose, Bld 123 (*)    Total Protein 6.1 (*)    Albumin 3.1 (*)    All other components within normal limits  URINALYSIS, ROUTINE W REFLEX MICROSCOPIC - Abnormal; Notable for the following components:   APPearance HAZY (*)    pH 9.0 (*)    All other components within normal limits  URINE CULTURE  LIPASE, BLOOD    EKG None  Radiology No results found.  Procedures Procedures    Medications Ordered in ED Medications  sorbitol, milk of mag, mineral oil, glycerin (SMOG) enema (has no administration in time range)    ED Course/ Medical Decision Making/ A&P                           Medical Decision Making Patient presenting with ongoing constipation, recently started on Linzess.  More recently he has been unable to urinate since yesterday.  He presents with bladder  distention.  Bladder scan at the urinary bladder was greater than 500 cc.  Foley catheter placed with recovery of the urine.  Enema ordered to treat constipation  Problems Addressed: Constipation, unspecified constipation type: chronic illness or injury Urinary retention: acute illness or injury  Amount and/or Complexity of Data Reviewed Independent  Historian:     Details: He is a cogent historian Labs: ordered.    Details: CBC, metabolic panel, lipase, urinalysis-normal except glucose high, protein low, hemoglobin low  Risk Decision regarding hospitalization.           Final Clinical Impression(s) / ED Diagnoses Final diagnoses:  Urinary retention  Constipation, unspecified constipation type    Rx / DC Orders ED Discharge Orders     None         Daleen Bo, MD 04/01/22 [phone]

## 2022-04-01 NOTE — ED Notes (Signed)
Notified MD of pt complaint of acute urinary retention x 11 hours or more today. Per Dr. Eulis Foster, insert indwelling foley catheter immediately with no additional bladder scan required, do not perform in and out cath.

## 2022-04-01 NOTE — ED Notes (Signed)
Await enema set up at this time/ Secretary asked to order said items for enema

## 2022-04-01 NOTE — Discharge Instructions (Signed)
Continue taking Linzess for constipation as previously prescribed.  Try to drink plenty of fluids and eat a high-fiber diet.  Follow instructions for Foley catheter care at home.  Call the urologist for follow-up appointment to be seen and treated for urinary retention.  Call your PCP to get help with constipation.  You can continue treating with MiraLAX, while taking Linzess to help with better stooling.

## 2022-04-01 NOTE — ED Triage Notes (Signed)
Patient arrived by EMS with complaint of constipation x 4 days and difficulty urinating. Patient alert and unable to ambulate due to weakness on one side

## 2022-04-02 ENCOUNTER — Encounter: Payer: Self-pay | Admitting: Physical Medicine & Rehabilitation

## 2022-04-02 DIAGNOSIS — Z7401 Bed confinement status: Secondary | ICD-10-CM | POA: Diagnosis not present

## 2022-04-02 DIAGNOSIS — R531 Weakness: Secondary | ICD-10-CM | POA: Diagnosis not present

## 2022-04-04 ENCOUNTER — Telehealth: Payer: Self-pay | Admitting: *Deleted

## 2022-04-04 NOTE — Telephone Encounter (Signed)
PT POC request for 1wk1, 2wk6, 1wk2.  Approval given.

## 2022-04-06 ENCOUNTER — Ambulatory Visit (INDEPENDENT_AMBULATORY_CARE_PROVIDER_SITE_OTHER): Payer: Medicare HMO | Admitting: Physician Assistant

## 2022-04-06 ENCOUNTER — Encounter: Payer: Self-pay | Admitting: Physician Assistant

## 2022-04-06 VITALS — BP 98/67 | HR 81 | Temp 97.7°F

## 2022-04-06 DIAGNOSIS — R339 Retention of urine, unspecified: Secondary | ICD-10-CM | POA: Diagnosis not present

## 2022-04-06 DIAGNOSIS — E782 Mixed hyperlipidemia: Secondary | ICD-10-CM | POA: Diagnosis not present

## 2022-04-06 DIAGNOSIS — K5900 Constipation, unspecified: Secondary | ICD-10-CM | POA: Diagnosis not present

## 2022-04-06 DIAGNOSIS — E1169 Type 2 diabetes mellitus with other specified complication: Secondary | ICD-10-CM

## 2022-04-06 DIAGNOSIS — R202 Paresthesia of skin: Secondary | ICD-10-CM

## 2022-04-06 DIAGNOSIS — F341 Dysthymic disorder: Secondary | ICD-10-CM | POA: Diagnosis not present

## 2022-04-06 DIAGNOSIS — D329 Benign neoplasm of meninges, unspecified: Secondary | ICD-10-CM | POA: Diagnosis not present

## 2022-04-06 DIAGNOSIS — Z09 Encounter for follow-up examination after completed treatment for conditions other than malignant neoplasm: Secondary | ICD-10-CM | POA: Diagnosis not present

## 2022-04-06 NOTE — Progress Notes (Signed)
Established patient visit   Patient: Patrick Buchanan   DOB: Mar 03, 1951   71 y.o. Male  MRN: [id number] Visit Date: 04/06/2022  Chief Complaint  Patient presents with   Hospitalization Follow-up   Subjective    HPI  Patient presents for hospital follow-up. Patient was admitted 02/27/22 for bilateral parietal craniotomy and resection of large parasagittal meningioma and discharged for inpatient rehabilitation 03/07/22. Patient reports has 24/7 care. Does have home health PT/OT. Has no acute concerns today. Patient is accompanied by his health aide and states his urine output has been good. Urine is clear and no longer having hematuria.   Discharge summary:  Admit date: 03/07/2022 Discharge date: 03/30/2022   Discharge Diagnoses:  Principal Problem:   Meningioma [facility]) Active problems: Functional deficits secondary to parasagittal meningioma status post resection Left hemiparesis Dyslipidemia Gastroesophageal reflux disease Diabetes mellitus type 2 Anemia Constipation Left posterior tibial DVT Constipation LLE spasticity   Discharged Condition: stable   Significant Diagnostic Studies: Narrative & Impression  CLINICAL DATA:  Constipation   EXAM: ABDOMEN - 1 VIEW   COMPARISON:  None Available.   FINDINGS: Bowel gas pattern is nonspecific. No abnormal masses or calcifications are seen. Kidneys are partly obscured by bowel contents. There is no evidence of any significant amount of stool in the colon. Degenerative changes are noted in the lumbar spine.   IMPRESSION: Nonspecific bowel gas pattern. There is no evidence of any significant amount of stool in colon.     Electronically Signed   By: Elmer Picker M.D.   On: 03/23/2022 11:54        Labs:  Basic Metabolic Panel: Last Labs      Recent Labs  Lab 03/28/22 0530  NA 141  K 4.0  CL 106  CO2 29  GLUCOSE 111*  BUN 14  CREATININE 0.75  CALCIUM 8.7*        CBC: Last Labs       Recent  Labs  Lab 03/24/22 0548 03/28/22 0530  WBC 4.7 4.2  HGB 10.4* 10.4*  HCT 32.5* 31.4*  MCV 96.2 94.6  PLT 162 188        CBG: Last Labs   No results for input(s): "GLUCAP" in the last 168 hours.     Brief HPI:   Patrick Buchanan is a 71 y.o. male with a history of spinal meningioma who presented with symptomatic multiple cranial meningiomas.  He presented to the hospital on 5/8 and underwent bilateral parietal craniotomy with resection of parasagittal tumor.      Hospital Course: ADARRIUS GRAEFF was admitted to rehab 03/07/2022 for inpatient therapies to consist of PT, ST and OT at least three hours five days a week. Past admission physiatrist, therapy team and rehab RN have worked together to provide customized collaborative inpatient rehab. Neurontin 100 mg BID for neuropathic pain. LLE spasticity and increased tone. Splint to LLE overnight and baclofen 5 mg TID started on 5/17.  LLE venous duplex consistent with DVT of posterior tibial vein. Started on Lovenox 40 mg BID after confirming dosing with Dr. Annette Stable.  Staples removed on 5/18. Steroid dosing tapered and decreased to every eight hours on 5/19 oral form. Constipation bothersome and given enema and changed MoM to Miralax on 5/19 and magnesium gluconate added at bedtime. Baclofen increased to 10 mg TID. Repeat LLE venous duplex on 5/24 unchanged. Left heel cord less tight. E-stim therapy to left lower leg per OT.  Milk of magnesia added to nighttime regimen for  constipation. Steroid therapy completed on 5/29. Required disimpaction and SMOG enema on 5/31 and 6/1 and follow-up x-ray without significant amount of stool in colon. Linzess started on 6/1 with discontinuation of MoM and Miralax. Low dose senna-S continued. Required Fleets enema on 6/6. Increased senna to 2 tablets at bedtime.   Blood pressures were monitored on TID basis and magnesium gluconate 250 mg started q HS on 5/20.    Diabetes has been monitored with some  elevation of CBGs secondary to steroids. Diet controlled.   Rehab course: During patient's stay in rehab weekly team conferences were held to monitor patient's progress, set goals and discuss barriers to discharge. At admission, patient required max assist with mobility, mod to max assist with ADLs and total assist for lower body dressing.   He has had improvement in activity tolerance, balance, postural control as well as ability to compensate for deficits. He has had improvement in functional use RUE/LUE  and RLE/LLE as well as improvement in awareness Splint required for LLE and baclofen for spacticity.    Vitamin D and D3 supplements prescribed to patient's pharmacy at time of discharge.   Disposition: Home Discharge disposition: 01-Home or Self Care              Medications: Outpatient Medications Prior to Visit  Medication Sig   baclofen (LIORESAL) 10 MG tablet Take 1 tablet (10 mg total) by mouth 3 (three) times daily.   calcium carbonate (TUMS - DOSED IN MG ELEMENTAL CALCIUM) 500 MG chewable tablet Chew 1 tablet (200 mg of elemental calcium total) by mouth 3 (three) times daily with meals.   chlorpheniramine (CHLOR-TRIMETON) 4 MG tablet Take 4 mg by mouth 2 (two) times daily as needed for allergies.   Cholecalciferol (VITAMIN D3) 125 MCG (5000 UT) CAPS Take 1 capsule (5,000 Units total) by mouth daily.   diclofenac Sodium (VOLTAREN) 1 % GEL Apply 2 g topically 3 (three) times daily.   escitalopram (LEXAPRO) 20 MG tablet Take 1 tablet (20 mg total) by mouth daily.   gabapentin (NEURONTIN) 100 MG capsule Take 1 capsule (100 mg total) by mouth 2 (two) times daily.   HYDROcodone-acetaminophen (NORCO/VICODIN) 5-325 MG tablet Take 1 tablet by mouth every 4 (four) hours as needed for moderate pain.   lamoTRIgine (LAMICTAL) 100 MG tablet Take 1 tablet (100 mg total) by mouth 2 (two) times daily.   levETIRAcetam (KEPPRA) 500 MG tablet Take 1 tablet (500 mg total) by mouth 2 (two) times  daily.   linaclotide (LINZESS) 145 MCG CAPS capsule Take 1 capsule (145 mcg total) by mouth daily before breakfast.   magnesium gluconate (MAGONATE) 500 MG tablet Take 0.5 tablets (250 mg total) by mouth at bedtime.   Multiple Vitamin (MULTIVITAMIN) tablet Take 1 tablet by mouth daily.   pantoprazole (PROTONIX) 40 MG tablet Take 1 tablet (40 mg total) by mouth at bedtime.   polyvinyl alcohol (LIQUIFILM TEARS) 1.4 % ophthalmic solution Place 1 drop into both eyes as needed for dry eyes.   rosuvastatin (CRESTOR) 20 MG tablet Take 1 tablet (20 mg total) by mouth daily.   senna-docusate (SENOKOT-S) 8.6-50 MG tablet Take 2 tablets by mouth at bedtime.   traZODone (DESYREL) 50 MG tablet Take 0.5-1 tablets (25-50 mg total) by mouth at bedtime as needed for sleep.   Vitamin D, Ergocalciferol, (DRISDOL) 1.25 MG (50000 UNIT) CAPS capsule Take 1 capsule (50,000 Units total) by mouth every Sunday. Take one tablet wkly   No facility-administered medications prior to visit.  Review of Systems Review of Systems:  A fourteen system review of systems was performed and found to be positive as per HPI.     Objective    BP 98/67   Pulse 81   Temp 97.7 F (36.5 C)  BP Readings from Last 3 Encounters:  04/06/22 98/67  04/01/22 112/79  03/30/22 116/63   Wt Readings from Last 3 Encounters:  03/07/22 203 lb 11.2 oz (92.4 kg)  02/27/22 218 lb (98.9 kg)  02/22/22 219 lb (99.3 kg)    Physical Exam  General:  Cooperative, uncomfortable appearing, in no acute distress  Neuro:  Alert and oriented,  extra-ocular muscles intact  HEENT:  Normocephalic, atraumatic, neck supple  Skin:  no gross rash, warm, pink. Cardiac:  RRR, S1 S2 Respiratory: CTA B/L  Vascular:  Ext warm, no cyanosis apprec.; cap RF less 2 sec.    No results found for any visits on 04/06/22.  Assessment & Plan      Problem List Items Addressed This Visit       Endocrine   Mixed diabetic hyperlipidemia associated with type 2  diabetes mellitus ([place])   Diabetes mellitus ([place])     Nervous and Auditory   Meningioma ([place])     Other   dysthymia (Chronic)   Other Visit Diagnoses     Hospital discharge follow-up    -  Primary   Paresthesia of left lower extremity       Constipation, unspecified constipation type          Hospital discharge follow-up, Meningoma: -S/p bilateral parietal craniotomy with resection of parasagittal tumor. -Reviewed hospital notes, labs and imaging. -Follow-up with neurosurgery and physical medicine as scheduled. -Continue Lamictal 100 mg BID, Keppra 500 mg BID, baclofen 10 mg TID and hydrocodone-acet 5-325 mg as needed. -Continue home health PT/OT.  Paresthesia of left lower extremity: -Followed by neurology. -Continue gabapentin 100 mg BID.  Constipation: -Continue Linzess 145 mcg before breakfast and magnesium 250 mg at bedtime.  Type 2 diabetes mellitus: -Associated with hyperlipidemia. -Diet controlled. -A1c from 02/22/22 5.7. -Will continue to monitor.  Mixed diabetic hyperlipidemia associated with type 2 diabetes mellitus: -Controlled. -Continue rosuvastatin 20 mg daily. -Recommend repeating lipid panel/hepatic function at f/up visit.  Persistent depressive disorder: -Continue Lexapro 20 mg.  -Will continue to monitor.  Urinary retention: -Improved and not retaining fluid per patient and caregiver. -Continue Foley Catheter.  Return in about 6 months (around 10/06/2022) for Follow up- DM, HLD, mood and FBW.        Lorrene Reid, PA-C  [facility] [place] [phone] (phone) [phone] (fax)  [place]

## 2022-04-10 ENCOUNTER — Emergency Department (HOSPITAL_COMMUNITY)
Admission: EM | Admit: 2022-04-10 | Discharge: 2022-04-10 | Disposition: A | Discharge status: Home / Self Care | Payer: Medicare HMO | Attending: Emergency Medicine | Admitting: Emergency Medicine

## 2022-04-10 ENCOUNTER — Encounter (HOSPITAL_COMMUNITY): Payer: Self-pay

## 2022-04-10 DIAGNOSIS — R4182 Altered mental status, unspecified: Secondary | ICD-10-CM | POA: Insufficient documentation

## 2022-04-10 DIAGNOSIS — R410 Disorientation, unspecified: Secondary | ICD-10-CM | POA: Insufficient documentation

## 2022-04-10 DIAGNOSIS — R404 Transient alteration of awareness: Secondary | ICD-10-CM | POA: Insufficient documentation

## 2022-04-10 DIAGNOSIS — R509 Fever, unspecified: Secondary | ICD-10-CM | POA: Diagnosis not present

## 2022-04-10 DIAGNOSIS — Z7401 Bed confinement status: Secondary | ICD-10-CM | POA: Diagnosis not present

## 2022-04-10 DIAGNOSIS — R531 Weakness: Secondary | ICD-10-CM | POA: Diagnosis not present

## 2022-04-10 LAB — URINALYSIS, ROUTINE W REFLEX MICROSCOPIC
Bilirubin Urine: NEGATIVE
Glucose, UA: NEGATIVE mg/dL
Ketones, ur: NEGATIVE mg/dL
Nitrite: NEGATIVE
Protein, ur: NEGATIVE mg/dL
Specific Gravity, Urine: 1.013 (ref 1.005–1.030)
pH: 7 (ref 5.0–8.0)

## 2022-04-10 LAB — COMPREHENSIVE METABOLIC PANEL
ALT: 18 U/L (ref 0–44)
AST: 20 U/L (ref 15–41)
Albumin: 3.2 g/dL — ABNORMAL LOW (ref 3.5–5.0)
Alkaline Phosphatase: 84 U/L (ref 38–126)
Anion gap: 7 (ref 5–15)
BUN: 12 mg/dL (ref 8–23)
CO2: 29 mmol/L (ref 22–32)
Calcium: 9.3 mg/dL (ref 8.9–10.3)
Chloride: 107 mmol/L (ref 98–111)
Creatinine, Ser: 0.67 mg/dL (ref 0.61–1.24)
GFR, Estimated: >60 mL/min (ref >60)
Glucose, Bld: 101 mg/dL — ABNORMAL HIGH (ref 70–99)
Potassium: 4.2 mmol/L (ref 3.5–5.1)
Sodium: 143 mmol/L (ref 135–145)
Total Bilirubin: 0.5 mg/dL (ref 0.3–1.2)
Total Protein: 6.7 g/dL (ref 6.5–8.1)

## 2022-04-10 LAB — CBC WITH DIFFERENTIAL/PLATELET
Abs Immature Granulocytes: 0.05 10*3/uL (ref 0.00–0.07)
Basophils Absolute: 0 10*3/uL (ref 0.0–0.1)
Basophils Relative: 1 %
Eosinophils Absolute: 0.1 10*3/uL (ref 0.0–0.5)
Eosinophils Relative: 2 %
HCT: 34.5 % — ABNORMAL LOW (ref 39.0–52.0)
Hemoglobin: 10.9 g/dL — ABNORMAL LOW (ref 13.0–17.0)
Immature Granulocytes: 1 %
Lymphocytes Relative: 13 %
Lymphs Abs: 0.8 10*3/uL (ref 0.7–4.0)
MCH: 30.1 pg (ref 26.0–34.0)
MCHC: 31.6 g/dL (ref 30.0–36.0)
MCV: 95.3 fL (ref 80.0–100.0)
Monocytes Absolute: 0.5 10*3/uL (ref 0.1–1.0)
Monocytes Relative: 8 %
Neutro Abs: 4.4 10*3/uL (ref 1.7–7.7)
Neutrophils Relative %: 75 %
Platelets: 365 10*3/uL (ref 150–400)
RBC: 3.62 MIL/uL — ABNORMAL LOW (ref 4.22–5.81)
RDW: 14.9 % (ref 11.5–15.5)
WBC: 5.8 10*3/uL (ref 4.0–10.5)
nRBC: 0 % (ref 0.0–0.2)

## 2022-04-10 LAB — AMMONIA: Ammonia: 34 umol/L (ref 9–35)

## 2022-04-10 NOTE — Discharge Instructions (Signed)
Return for worsening episodic periods of confusion or persistent confusion fevers.

## 2022-04-10 NOTE — ED Notes (Signed)
Contacted [facility] and arranged transport home.

## 2022-04-10 NOTE — ED Provider Notes (Signed)
[facility] DEPT Provider Note   CSN: [id number] Arrival date & time: 04/10/22  1032     History  Chief Complaint  Patient presents with   Altered Mental Status    Patrick Buchanan is a 71 y.o. male.  71 yo M with a chief complaints of some confusion.  Per the patient this happened in the middle the night.  He told me that he got tangled up in something that was in his bed and became a bit irritated.  He had called his daughter who came and helped him.  This morning they had called his family doctor who suggested he be evaluated in the emergency department.  Patient denies any confusion currently.  Denies any specific concern.  He thinks he is fine and felt like his daughter made him come here.   Altered Mental Status      Home Medications Prior to Admission medications   Medication Sig Start Date End Date Taking? Authorizing Provider  baclofen (LIORESAL) 10 MG tablet Take 1 tablet (10 mg total) by mouth 3 (three) times daily. 03/30/22   Setzer, Edman Circle, PA-C  calcium carbonate (TUMS - DOSED IN MG ELEMENTAL CALCIUM) 500 MG chewable tablet Chew 1 tablet (200 mg of elemental calcium total) by mouth 3 (three) times daily with meals. 03/30/22   Setzer, Edman Circle, PA-C  chlorpheniramine (CHLOR-TRIMETON) 4 MG tablet Take 4 mg by mouth 2 (two) times daily as needed for allergies.    [provider]  Cholecalciferol (VITAMIN D3) 125 MCG (5000 UT) CAPS Take 1 capsule (5,000 Units total) by mouth daily. 03/30/22   Setzer, Edman Circle, PA-C  diclofenac Sodium (VOLTAREN) 1 % GEL Apply 2 g topically 3 (three) times daily. 03/30/22   Setzer, Edman Circle, PA-C  escitalopram (LEXAPRO) 20 MG tablet Take 1 tablet (20 mg total) by mouth daily. 03/30/22   Setzer, Edman Circle, PA-C  gabapentin (NEURONTIN) 100 MG capsule Take 1 capsule (100 mg total) by mouth 2 (two) times daily. 03/30/22   Setzer, Edman Circle, PA-C  HYDROcodone-acetaminophen (NORCO/VICODIN) 5-325 MG tablet Take 1  tablet by mouth every 4 (four) hours as needed for moderate pain. 03/30/22   Setzer, Edman Circle, PA-C  lamoTRIgine (LAMICTAL) 100 MG tablet Take 1 tablet (100 mg total) by mouth 2 (two) times daily. 03/30/22   Setzer, Edman Circle, PA-C  levETIRAcetam (KEPPRA) 500 MG tablet Take 1 tablet (500 mg total) by mouth 2 (two) times daily. 03/30/22   Setzer, Edman Circle, PA-C  linaclotide [facility]) 145 MCG CAPS capsule Take 1 capsule (145 mcg total) by mouth daily before breakfast. 03/31/22   Setzer, Edman Circle, PA-C  magnesium gluconate (MAGONATE) 500 MG tablet Take 0.5 tablets (250 mg total) by mouth at bedtime. 03/30/22   Setzer, Edman Circle, PA-C  Multiple Vitamin (MULTIVITAMIN) tablet Take 1 tablet by mouth daily.    [provider]  pantoprazole (PROTONIX) 40 MG tablet Take 1 tablet (40 mg total) by mouth at bedtime. 03/30/22   Setzer, Edman Circle, PA-C  polyvinyl alcohol (LIQUIFILM TEARS) 1.4 % ophthalmic solution Place 1 drop into both eyes as needed for dry eyes.    [provider]  rosuvastatin (CRESTOR) 20 MG tablet Take 1 tablet (20 mg total) by mouth daily. 03/30/22   Setzer, Edman Circle, PA-C  senna-docusate (SENOKOT-S) 8.6-50 MG tablet Take 2 tablets by mouth at bedtime. 03/30/22   Setzer, Edman Circle, PA-C  traZODone (DESYREL) 50 MG tablet Take 0.5-1 tablets (25-50 mg total)  by mouth at bedtime as needed for sleep. 03/30/22   Setzer, Edman Circle, PA-C  Vitamin D, Ergocalciferol, (DRISDOL) 1.25 MG (50000 UNIT) CAPS capsule Take 1 capsule (50,000 Units total) by mouth every Sunday. Take one tablet wkly 04/02/22   Setzer, Edman Circle, PA-C      Allergies    Other    Review of Systems   Review of Systems  Physical Exam Updated Vital Signs BP 120/75 (BP Location: Right Arm)   Pulse 75   Temp 98.8 F (37.1 C)   Resp 16   SpO2 94%  Physical Exam Vitals and nursing note reviewed.  Constitutional:      Appearance: He is well-developed.  HENT:     Head: Normocephalic and atraumatic.  Eyes:     Pupils: Pupils are  equal, round, and reactive to light.  Neck:     Vascular: No JVD.  Cardiovascular:     Rate and Rhythm: Normal rate and regular rhythm.     Heart sounds: No murmur heard.    No friction rub. No gallop.  Pulmonary:     Effort: No respiratory distress.     Breath sounds: No wheezing.  Abdominal:     General: There is no distension.     Tenderness: There is no abdominal tenderness. There is no guarding or rebound.  Musculoskeletal:        General: Normal range of motion.     Cervical back: Normal range of motion and neck supple.  Skin:    Coloration: Skin is not pale.     Findings: No rash.  Neurological:     Mental Status: He is alert and oriented to person, place, and time.  Psychiatric:        Behavior: Behavior normal.     ED Results / Procedures / Treatments   Labs (all labs ordered are listed, but only abnormal results are displayed) Labs Reviewed  CBC WITH DIFFERENTIAL/PLATELET - Abnormal; Notable for the following components:      Result Value   RBC 3.62 (*)    Hemoglobin 10.9 (*)    HCT 34.5 (*)    All other components within normal limits  COMPREHENSIVE METABOLIC PANEL - Abnormal; Notable for the following components:   Glucose, Bld 101 (*)    Albumin 3.2 (*)    All other components within normal limits  URINALYSIS, ROUTINE W REFLEX MICROSCOPIC - Abnormal; Notable for the following components:   APPearance TURBID (*)    Hgb urine dipstick SMALL (*)    Leukocytes,Ua TRACE (*)    Bacteria, UA RARE (*)    All other components within normal limits  URINE CULTURE  AMMONIA    EKG None  Radiology No results found.  Procedures Procedures    Medications Ordered in ED Medications - No data to display  ED Course/ Medical Decision Making/ A&P                           Medical Decision Making Amount and/or Complexity of Data Reviewed Labs: ordered.   71 yo M with a chief complaints of transient confusion.  Reportedly this happened in the middle the  night and he said he was tangled up with something in his bed.  Now that that is over it is resolved.  He denies any specific complaint currently.  We will obtain a laboratory evaluation UA as there are some family concern for urinary tract infection.  UA independently interpreted  by me without obvious infection.  No significant electrolyte abnormality no change to renal function specifically no uremia no hyponatremia ammonia levels normal.  I discussed results with the patient and family.  We will have them follow-up with her family doctor.  2:05 PM:  I have discussed the diagnosis/risks/treatment options with the patient and family.  Evaluation and diagnostic testing in the emergency department does not suggest an emergent condition requiring admission or immediate intervention beyond what has been performed at this time.  They will follow up with  PCP. We also discussed returning to the ED immediately if new or worsening sx occur. We discussed the sx which are most concerning (e.g., sudden worsening pain, fever, inability to tolerate by mouth) that necessitate immediate return. Medications administered to the patient during their visit and any new prescriptions provided to the patient are listed below.  Medications given during this visit Medications - No data to display   The patient appears reasonably screen and/or stabilized for discharge and I doubt any other medical condition or other [facility] requiring further screening, evaluation, or treatment in the ED at this time prior to discharge.          Final Clinical Impression(s) / ED Diagnoses Final diagnoses:  Transient alteration of awareness    Rx / DC Orders ED Discharge Orders     None         Deno Etienne, DO 04/10/22 1405

## 2022-04-10 NOTE — ED Notes (Signed)
Pt has an existing catheter

## 2022-04-10 NOTE — ED Triage Notes (Signed)
Pt arrives via [facility] from home for c/o altered mental status. Pt's family reports that since last night he has been asking the same questions repeatedly and not remembering the answers. Pt did the same with EMS enroute. Pt A+Ox4 on arrival. Foley catheter in place. Pt denies pain. Pt reports chronic weakness on L side.

## 2022-04-11 ENCOUNTER — Emergency Department (HOSPITAL_BASED_OUTPATIENT_CLINIC_OR_DEPARTMENT_OTHER)
Admission: EM | Admit: 2022-04-11 | Discharge: 2022-04-11 | Disposition: A | Discharge status: Home / Self Care | Payer: Medicare HMO | Attending: Emergency Medicine | Admitting: Emergency Medicine

## 2022-04-11 ENCOUNTER — Other Ambulatory Visit: Payer: Self-pay

## 2022-04-11 ENCOUNTER — Encounter: Payer: Self-pay | Admitting: Physician Assistant

## 2022-04-11 DIAGNOSIS — T83091A Other mechanical complication of indwelling urethral catheter, initial encounter: Secondary | ICD-10-CM | POA: Diagnosis not present

## 2022-04-11 DIAGNOSIS — Z743 Need for continuous supervision: Secondary | ICD-10-CM | POA: Diagnosis not present

## 2022-04-11 DIAGNOSIS — Y69 Unspecified misadventure during surgical and medical care: Secondary | ICD-10-CM | POA: Diagnosis not present

## 2022-04-11 DIAGNOSIS — Z466 Encounter for fitting and adjustment of urinary device: Secondary | ICD-10-CM | POA: Diagnosis not present

## 2022-04-11 DIAGNOSIS — S39848A Other specified injuries of external genitals, initial encounter: Secondary | ICD-10-CM | POA: Diagnosis not present

## 2022-04-11 DIAGNOSIS — R279 Unspecified lack of coordination: Secondary | ICD-10-CM | POA: Diagnosis not present

## 2022-04-11 DIAGNOSIS — Z978 Presence of other specified devices: Secondary | ICD-10-CM

## 2022-04-11 LAB — URINE CULTURE: Culture: NO GROWTH

## 2022-04-11 NOTE — ED Triage Notes (Signed)
Pt BIB [facility] from home with home health aid. Pt states waking up with morning with foley catheter dislodged. Pt then replaced same foley into urethra. Denies pain. No bleeding. Foley was placed early June. Foul smell on assessment. No obvious urethral trauma present.

## 2022-04-11 NOTE — Discharge Instructions (Signed)
So far your urine has not grown back any signs of bacteria.  Follow-up as planned

## 2022-04-11 NOTE — ED Notes (Signed)
Pt's nurse aid Inez Catalina at bedside. States pt will require ambulance to return home. Pt and caregiver at bedside informed arrangements would be made at time of discharge. Daughter [name] informed of pts arrival to [place].

## 2022-04-11 NOTE — ED Provider Notes (Signed)
[facility] EMERGENCY DEPT Provider Note   CSN: [id number] Arrival date & time: 04/11/22  3810     History  Chief Complaint  Patient presents with   Foley Catheter Issue    Patrick Buchanan is a 71 y.o. male.  Patient is a 71 year old male who is bedbound with history of cerebral aneurysm and meningioma status postresection, diabetes and Foley catheter placement due to bladder obstruction related to ongoing constipation who is presenting today with concern that his Foley catheter became dislodged.  Patient was seen in the emergency room yesterday and at that time had labs done and a urine done due to concern for transient confusion.  Work-up was reassuring and he was discharged home.  He reports in the middle of the night he woke up and his catheter seem like it was halfway out and he pushed it back in but then became concerned about it and came here today for evaluation.  He reports urine is still flowing through the catheter he denies any pain or discomfort and has not noticed any hematuria.  The history is provided by the patient.       Home Medications Prior to Admission medications   Medication Sig Start Date End Date Taking? Authorizing Provider  baclofen (LIORESAL) 10 MG tablet Take 1 tablet (10 mg total) by mouth 3 (three) times daily. 03/30/22   Setzer, Edman Circle, PA-C  calcium carbonate (TUMS - DOSED IN MG ELEMENTAL CALCIUM) 500 MG chewable tablet Chew 1 tablet (200 mg of elemental calcium total) by mouth 3 (three) times daily with meals. 03/30/22   Setzer, Edman Circle, PA-C  chlorpheniramine (CHLOR-TRIMETON) 4 MG tablet Take 4 mg by mouth 2 (two) times daily as needed for allergies.    [provider]  Cholecalciferol (VITAMIN D3) 125 MCG (5000 UT) CAPS Take 1 capsule (5,000 Units total) by mouth daily. 03/30/22   Setzer, Edman Circle, PA-C  diclofenac Sodium (VOLTAREN) 1 % GEL Apply 2 g topically 3 (three) times daily. 03/30/22   Setzer, Edman Circle, PA-C   escitalopram (LEXAPRO) 20 MG tablet Take 1 tablet (20 mg total) by mouth daily. 03/30/22   Setzer, Edman Circle, PA-C  gabapentin (NEURONTIN) 100 MG capsule Take 1 capsule (100 mg total) by mouth 2 (two) times daily. 03/30/22   Setzer, Edman Circle, PA-C  HYDROcodone-acetaminophen (NORCO/VICODIN) 5-325 MG tablet Take 1 tablet by mouth every 4 (four) hours as needed for moderate pain. 03/30/22   Setzer, Edman Circle, PA-C  lamoTRIgine (LAMICTAL) 100 MG tablet Take 1 tablet (100 mg total) by mouth 2 (two) times daily. 03/30/22   Setzer, Edman Circle, PA-C  levETIRAcetam (KEPPRA) 500 MG tablet Take 1 tablet (500 mg total) by mouth 2 (two) times daily. 03/30/22   Setzer, Edman Circle, PA-C  linaclotide [facility]) 145 MCG CAPS capsule Take 1 capsule (145 mcg total) by mouth daily before breakfast. 03/31/22   Setzer, Edman Circle, PA-C  magnesium gluconate (MAGONATE) 500 MG tablet Take 0.5 tablets (250 mg total) by mouth at bedtime. 03/30/22   Setzer, Edman Circle, PA-C  Multiple Vitamin (MULTIVITAMIN) tablet Take 1 tablet by mouth daily.    [provider]  pantoprazole (PROTONIX) 40 MG tablet Take 1 tablet (40 mg total) by mouth at bedtime. 03/30/22   Setzer, Edman Circle, PA-C  polyvinyl alcohol (LIQUIFILM TEARS) 1.4 % ophthalmic solution Place 1 drop into both eyes as needed for dry eyes.    [provider]  rosuvastatin (CRESTOR) 20 MG tablet Take 1 tablet (20  mg total) by mouth daily. 03/30/22   Setzer, Edman Circle, PA-C  senna-docusate (SENOKOT-S) 8.6-50 MG tablet Take 2 tablets by mouth at bedtime. 03/30/22   Setzer, Edman Circle, PA-C  traZODone (DESYREL) 50 MG tablet Take 0.5-1 tablets (25-50 mg total) by mouth at bedtime as needed for sleep. 03/30/22   Setzer, Edman Circle, PA-C  Vitamin D, Ergocalciferol, (DRISDOL) 1.25 MG (50000 UNIT) CAPS capsule Take 1 capsule (50,000 Units total) by mouth every Sunday. Take one tablet wkly 04/02/22   Setzer, Edman Circle, PA-C      Allergies    Other    Review of Systems   Review of Systems  Physical  Exam Updated Vital Signs BP (!) 106/95   Pulse 73   Temp 98.6 F (37 C) (Oral)   Resp 17   SpO2 93%  Physical Exam Vitals and nursing note reviewed.  Constitutional:      General: He is not in acute distress.    Appearance: Normal appearance.  HENT:     Head: Normocephalic.     Mouth/Throat:     Mouth: Mucous membranes are moist.  Cardiovascular:     Rate and Rhythm: Normal rate.  Pulmonary:     Effort: Pulmonary effort is normal.  Abdominal:     General: Abdomen is flat. Bowel sounds are normal. There is no distension.     Palpations: Abdomen is soft.     Tenderness: There is no abdominal tenderness.  Genitourinary:    Comments: Foley catheter is present in the penis without significant leakage or drainage.  Does not appear to have acute abnormality with the catheter.  Currently draining into the bag. Skin:    General: Skin is warm and dry.  Neurological:     Mental Status: He is alert and oriented to person, place, and time. Mental status is at baseline.     Comments: Left-sided paralysis  Psychiatric:        Mood and Affect: Mood normal.     ED Results / Procedures / Treatments   Labs (all labs ordered are listed, but only abnormal results are displayed) Labs Reviewed - No data to display  EKG None  Radiology No results found.  Procedures Procedures    Medications Ordered in ED Medications - No data to display  ED Course/ Medical Decision Making/ A&P                           Medical Decision Making  Patient with multiple medical problems presenting today due to concern for abnormality with his catheter.  On my inspection the catheter is draining and appears to be in the correct location.  And his description of what he thinks was wrong it is unclear what happened overnight.  The balloon does seem inflated and catheter is otherwise draining.  It has been present for 10 days.  Discussed with the patient that it appears to be in normal position however the  patient is worried and is requesting a change of catheter.  Patient had lab work done yesterday which was all reassuring.  Also urine culture to date has no growth.  Foley catheter was exchanged and patient appears reasonable for discharge        Final Clinical Impression(s) / ED Diagnoses Final diagnoses:  Status post insertion of Foley catheter    Rx / DC Orders ED Discharge Orders     None         Alyanna Stoermer,  Loree Fee, MD 04/11/22 [phone]

## 2022-04-13 ENCOUNTER — Telehealth: Payer: Self-pay | Admitting: Physician Assistant

## 2022-04-13 LAB — URINE CULTURE: Culture: >=100000 — AB

## 2022-04-13 NOTE — Telephone Encounter (Signed)
Patient is requesting a refill of Hydrocodone. Please advise. [phone]

## 2022-04-14 ENCOUNTER — Telehealth: Payer: Self-pay | Admitting: Emergency Medicine

## 2022-04-14 ENCOUNTER — Encounter: Payer: Self-pay | Admitting: Physical Medicine & Rehabilitation

## 2022-04-14 ENCOUNTER — Other Ambulatory Visit: Payer: Self-pay | Admitting: Physician Assistant

## 2022-04-14 NOTE — Telephone Encounter (Signed)
Patient aware.

## 2022-04-21 ENCOUNTER — Telehealth: Payer: Self-pay

## 2022-04-21 NOTE — Telephone Encounter (Signed)
Patrick Buchanan called to state is not happy with his home health agency. "They have gone down hill." He will wait to discuss his concerns during his appointment on 04/26/2022, with Dr. Naaman Plummer.   Patient has also been advise to call the staffing agency and voice his concerns.

## 2022-04-24 ENCOUNTER — Telehealth: Payer: Self-pay | Admitting: Physician Assistant

## 2022-04-24 ENCOUNTER — Other Ambulatory Visit: Payer: Self-pay | Admitting: Neurosurgery

## 2022-04-24 ENCOUNTER — Other Ambulatory Visit: Payer: Self-pay | Admitting: Nurse Practitioner

## 2022-04-24 ENCOUNTER — Other Ambulatory Visit (HOSPITAL_COMMUNITY): Payer: Self-pay | Admitting: Neurosurgery

## 2022-04-24 DIAGNOSIS — M791 Myalgia, unspecified site: Secondary | ICD-10-CM

## 2022-04-24 DIAGNOSIS — G8194 Hemiplegia, unspecified affecting left nondominant side: Secondary | ICD-10-CM

## 2022-04-24 DIAGNOSIS — E1169 Type 2 diabetes mellitus with other specified complication: Secondary | ICD-10-CM

## 2022-04-24 DIAGNOSIS — F5101 Primary insomnia: Secondary | ICD-10-CM

## 2022-04-24 DIAGNOSIS — F341 Dysthymic disorder: Secondary | ICD-10-CM

## 2022-04-24 DIAGNOSIS — D329 Benign neoplasm of meninges, unspecified: Secondary | ICD-10-CM

## 2022-04-24 DIAGNOSIS — K219 Gastro-esophageal reflux disease without esophagitis: Secondary | ICD-10-CM

## 2022-04-24 DIAGNOSIS — E559 Vitamin D deficiency, unspecified: Secondary | ICD-10-CM

## 2022-04-24 DIAGNOSIS — R202 Paresthesia of skin: Secondary | ICD-10-CM

## 2022-04-24 DIAGNOSIS — K5904 Chronic idiopathic constipation: Secondary | ICD-10-CM

## 2022-04-24 MED ORDER — TRAZODONE HCL 50 MG PO TABS: 25.0000 mg | ORAL_TABLET | Freq: Every evening | ORAL | 2 refills | Status: DC | PRN

## 2022-04-24 MED ORDER — LINACLOTIDE 145 MCG PO CAPS: 145.0000 ug | ORAL_CAPSULE | Freq: Every day | ORAL | 2 refills | Status: DC

## 2022-04-24 MED ORDER — GABAPENTIN 100 MG PO CAPS: 100.0000 mg | ORAL_CAPSULE | Freq: Two times a day (BID) | ORAL | 2 refills | Status: DC

## 2022-04-24 MED ORDER — VITAMIN D (ERGOCALCIFEROL) 1.25 MG (50000 UNIT) PO CAPS: 50000.0000 [IU] | ORAL_CAPSULE | ORAL | 2 refills | Status: DC

## 2022-04-24 MED ORDER — LAMOTRIGINE 100 MG PO TABS: 100.0000 mg | ORAL_TABLET | Freq: Two times a day (BID) | ORAL | 0 refills | Status: DC

## 2022-04-24 MED ORDER — ROSUVASTATIN CALCIUM 20 MG PO TABS: 20.0000 mg | ORAL_TABLET | Freq: Every day | ORAL | 3 refills | Status: DC

## 2022-04-24 MED ORDER — MAGNESIUM GLUCONATE 500 MG PO TABS: 250.0000 mg | ORAL_TABLET | Freq: Every day | ORAL | 2 refills | Status: DC

## 2022-04-24 MED ORDER — BACLOFEN 10 MG PO TABS: 10.0000 mg | ORAL_TABLET | Freq: Three times a day (TID) | ORAL | 1 refills | Status: DC

## 2022-04-24 MED ORDER — PANTOPRAZOLE SODIUM 40 MG PO TBEC: 40.0000 mg | DELAYED_RELEASE_TABLET | Freq: Every day | ORAL | 2 refills | Status: DC

## 2022-04-24 MED ORDER — LEVETIRACETAM 500 MG PO TABS: 500.0000 mg | ORAL_TABLET | Freq: Two times a day (BID) | ORAL | 2 refills | Status: DC

## 2022-04-24 MED ORDER — ESCITALOPRAM OXALATE 20 MG PO TABS: 20.0000 mg | ORAL_TABLET | Freq: Every day | ORAL | 2 refills | Status: DC

## 2022-04-24 NOTE — Telephone Encounter (Signed)
Patient requesting refill on Baclofen, Gabapentin, Lamotrigine, Levetiracetam, Linzess, magnesium, pantoprazole, crestor, trazodone, and vitamin d3. Please advise. [phone]

## 2022-04-24 NOTE — Telephone Encounter (Signed)
These were all started or continued by hospitalist uon his discharge. I filled them for 30 days with 2 additional refills. I read the hospital follow up note. She wrote to continue all medications as they were prescribed.

## 2022-04-26 ENCOUNTER — Encounter: Payer: Medicare HMO | Attending: Physical Medicine & Rehabilitation | Admitting: Physical Medicine & Rehabilitation

## 2022-04-26 ENCOUNTER — Encounter: Payer: Self-pay | Admitting: Physical Medicine & Rehabilitation

## 2022-04-26 VITALS — BP 121/75 | HR 107 | Ht 72.0 in

## 2022-04-26 DIAGNOSIS — D329 Benign neoplasm of meninges, unspecified: Secondary | ICD-10-CM | POA: Diagnosis not present

## 2022-04-26 DIAGNOSIS — K5900 Constipation, unspecified: Secondary | ICD-10-CM | POA: Diagnosis present

## 2022-04-26 DIAGNOSIS — J309 Allergic rhinitis, unspecified: Secondary | ICD-10-CM | POA: Diagnosis present

## 2022-04-26 DIAGNOSIS — G936 Cerebral edema: Secondary | ICD-10-CM | POA: Diagnosis present

## 2022-04-26 DIAGNOSIS — R338 Other retention of urine: Secondary | ICD-10-CM | POA: Diagnosis not present

## 2022-04-26 DIAGNOSIS — M25562 Pain in left knee: Secondary | ICD-10-CM | POA: Diagnosis not present

## 2022-04-26 DIAGNOSIS — R45851 Suicidal ideations: Secondary | ICD-10-CM | POA: Diagnosis present

## 2022-04-26 DIAGNOSIS — Z86011 Personal history of benign neoplasm of the brain: Secondary | ICD-10-CM | POA: Diagnosis not present

## 2022-04-26 DIAGNOSIS — L89322 Pressure ulcer of left buttock, stage 2: Secondary | ICD-10-CM | POA: Diagnosis present

## 2022-04-26 DIAGNOSIS — D649 Anemia, unspecified: Secondary | ICD-10-CM | POA: Diagnosis not present

## 2022-04-26 DIAGNOSIS — R9431 Abnormal electrocardiogram [ECG] [EKG]: Secondary | ICD-10-CM | POA: Diagnosis not present

## 2022-04-26 DIAGNOSIS — R4689 Other symptoms and signs involving appearance and behavior: Secondary | ICD-10-CM | POA: Diagnosis not present

## 2022-04-26 DIAGNOSIS — H5710 Ocular pain, unspecified eye: Secondary | ICD-10-CM | POA: Diagnosis not present

## 2022-04-26 DIAGNOSIS — D496 Neoplasm of unspecified behavior of brain: Secondary | ICD-10-CM

## 2022-04-26 DIAGNOSIS — M11261 Other chondrocalcinosis, right knee: Secondary | ICD-10-CM | POA: Diagnosis present

## 2022-04-26 DIAGNOSIS — E1169 Type 2 diabetes mellitus with other specified complication: Secondary | ICD-10-CM | POA: Diagnosis not present

## 2022-04-26 DIAGNOSIS — K219 Gastro-esophageal reflux disease without esophagitis: Secondary | ICD-10-CM | POA: Diagnosis present

## 2022-04-26 DIAGNOSIS — G8194 Hemiplegia, unspecified affecting left nondominant side: Secondary | ICD-10-CM

## 2022-04-26 DIAGNOSIS — F419 Anxiety disorder, unspecified: Secondary | ICD-10-CM | POA: Diagnosis present

## 2022-04-26 DIAGNOSIS — M1712 Unilateral primary osteoarthritis, left knee: Secondary | ICD-10-CM | POA: Diagnosis not present

## 2022-04-26 DIAGNOSIS — I10 Essential (primary) hypertension: Secondary | ICD-10-CM | POA: Diagnosis present

## 2022-04-26 DIAGNOSIS — R4587 Impulsiveness: Secondary | ICD-10-CM | POA: Diagnosis present

## 2022-04-26 DIAGNOSIS — Z7401 Bed confinement status: Secondary | ICD-10-CM | POA: Diagnosis not present

## 2022-04-26 DIAGNOSIS — R569 Unspecified convulsions: Secondary | ICD-10-CM | POA: Diagnosis present

## 2022-04-26 DIAGNOSIS — F32A Depression, unspecified: Secondary | ICD-10-CM | POA: Diagnosis present

## 2022-04-26 DIAGNOSIS — R258 Other abnormal involuntary movements: Secondary | ICD-10-CM | POA: Diagnosis present

## 2022-04-26 DIAGNOSIS — K5903 Drug induced constipation: Secondary | ICD-10-CM | POA: Diagnosis not present

## 2022-04-26 DIAGNOSIS — R627 Adult failure to thrive: Secondary | ICD-10-CM | POA: Diagnosis present

## 2022-04-26 DIAGNOSIS — G8114 Spastic hemiplegia affecting left nondominant side: Secondary | ICD-10-CM | POA: Diagnosis present

## 2022-04-26 DIAGNOSIS — Z794 Long term (current) use of insulin: Secondary | ICD-10-CM | POA: Diagnosis not present

## 2022-04-26 DIAGNOSIS — T380X5A Adverse effect of glucocorticoids and synthetic analogues, initial encounter: Secondary | ICD-10-CM | POA: Diagnosis present

## 2022-04-26 DIAGNOSIS — R29810 Facial weakness: Secondary | ICD-10-CM | POA: Diagnosis present

## 2022-04-26 DIAGNOSIS — R531 Weakness: Secondary | ICD-10-CM | POA: Diagnosis not present

## 2022-04-26 DIAGNOSIS — R410 Disorientation, unspecified: Secondary | ICD-10-CM | POA: Diagnosis not present

## 2022-04-26 DIAGNOSIS — R6889 Other general symptoms and signs: Secondary | ICD-10-CM | POA: Diagnosis not present

## 2022-04-26 DIAGNOSIS — E119 Type 2 diabetes mellitus without complications: Secondary | ICD-10-CM | POA: Diagnosis present

## 2022-04-26 DIAGNOSIS — D420 Neoplasm of uncertain behavior of cerebral meninges: Secondary | ICD-10-CM | POA: Diagnosis present

## 2022-04-26 DIAGNOSIS — S0501XA Injury of conjunctiva and corneal abrasion without foreign body, right eye, initial encounter: Secondary | ICD-10-CM | POA: Diagnosis present

## 2022-04-26 DIAGNOSIS — G9341 Metabolic encephalopathy: Secondary | ICD-10-CM | POA: Diagnosis present

## 2022-04-26 DIAGNOSIS — G8929 Other chronic pain: Secondary | ICD-10-CM | POA: Diagnosis not present

## 2022-04-26 DIAGNOSIS — R339 Retention of urine, unspecified: Secondary | ICD-10-CM | POA: Diagnosis present

## 2022-04-26 MED ORDER — DICLOFENAC SODIUM 50 MG PO TBEC: 50.0000 mg | DELAYED_RELEASE_TABLET | Freq: Two times a day (BID) | ORAL | 3 refills | Status: DC

## 2022-04-26 NOTE — Progress Notes (Signed)
Subjective:    Patient ID: Patrick Buchanan, male    DOB: 04-15-1951, 71 y.o.   MRN: [id number]  HPI  Mr. Heidrich is here in follow-up of his left parasagittal meningioma and subsequent resection with left hemiparesis.  He was with [place] on inpatient rehab until early June.  He struggled with urine retention once home as well as some hematuria.  He is being followed by urology and has an indwelling catheter.  He has ongoing weakness in his proximal LUE. HH PT and OT are coming to the house and working on strength and ROM. He hasn't done any work on transfers or standing.  He seems to be unsatisfied with the focus and the amount of therapy he is getting at home and is interested in pursuing other options.  He has ongoing pain in his left knee which he describes as awful. His right knee has been bothering him chronically.  He says that orthopedics has told him he has pseudogout in the right leg.  The left knee really is a barrier to his movement on that side.  He is tried some Voltaren gel with minimal relief.  He is using over-the-counter ibuprofen with some relief.  He is currently at home and has caregivers at the house.  He reports ongoing constipation which is led to an ER visit.  He states that he still is constipated.  His caregivers seem to indicate otherwise when they were in the room.  He was sent home on Linzess and nothing else scheduled.  He says he uses over-the-counter milk of magnesia as needed.  Patient denies any seizure activity.  He remains on medications as prescribed.  He questions whether he needs a brace for his left hand which at times is tight, especially in the morning hours when he first wakes up.     Pain Inventory Average Pain 8 Pain Right Now 8 My pain is sharp and burning  LOCATION OF PAIN  elbow, wrist, thigh, knee  BOWEL Number of stools per week: 2-3 Oral laxative use Yes  Type of laxative milk of  magnesia   BLADDER Foley    Mobility ability to climb steps?  no do you drive?  no use a wheelchair needs help with transfers  Function retired I need assistance with the following:  bathing, toileting, meal prep, and household duties  Neuro/Psych tremor trouble walking depression  Prior Studies Any changes since last visit?  no  Physicians involved in your care Any changes since last visit?  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  Tobacco Use   Smoking status: Former    Packs/day: 1.00    Years: 10.00    Total pack years: 10.00    Types: Cigarettes    Quit date: 2005    Years since quitting: 18.5   Smokeless tobacco: Never  Vaping Use   Vaping Use: Some days  Substance  and Sexual Activity   Alcohol use: Not Currently   Drug use: Not Currently   Sexual activity: Not Currently  Other Topics Concern   Not on file  Social History Narrative   Right handed   Widowed recently   Social Determinants of Health   Financial Resource Strain: Not on file  Food Insecurity: Not on file  Transportation Needs: Not on file  Physical Activity: Not on file  Stress: Not on file  Social Connections: Not on file   Past Surgical History:  Procedure Laterality Date   APPENDECTOMY     CEREBRAL ANEURYSM REPAIR  1992   clipping by Dr Sherwood Gambler; cannot have an MRI   CRANIOTOMY Bilateral 02/27/2022   Procedure: Craniotomy - bilateral - Parietal;  Surgeon: Earnie Larsson, MD;  Location: [facility];  Service: Neurosurgery;  Laterality: Bilateral;   HERNIA  REPAIR N/A 1990   inguinal   INGUINAL HERNIA REPAIR     as a child and then another at 61 yrs old   TONSILLECTOMY     TUMOR EXCISION  2005   benign cervical   Past Medical History:  Diagnosis Date   Allergy    hay fever   Blood transfusion without reported diagnosis    Cerebral aneurysm 1992   "leaking"; treated by Dr Sherwood Gambler   Depression    Diabetes mellitus without complication ([place])    type 2   GERD (gastroesophageal reflux disease)    past hx    BP 121/75   Pulse (!) 107   Ht 6' (1.829 m)   SpO2 92%   BMI 27.12 kg/m   Opioid Risk Score:   Fall Risk Score:  `1  Depression screen PHQ 2/9     04/26/2022   11:02 AM 04/06/2022    2:21 PM 06/30/2021    2:11 PM 03/10/2021    9:58 AM 11/10/2020    9:46 AM 06/03/2020   11:50 AM 02/04/2020   11:53 AM  Depression screen PHQ 2/9  Decreased Interest 2 0 0 0 0 0 0  Down, Depressed, Hopeless 1 0 0 0 0 0 0  PHQ - 2 Score 3 0 0 0 0 0 0  Altered sleeping 0 0 0 0 1 0 0  Tired, decreased energy 3 1 0 0 0 0 0  Change in appetite 0 1 0 0 1 0 0  Feeling bad or failure about yourself  2 1 0 0 0 0 0  Trouble concentrating 1 0 0 0 0 0 0  Moving slowly or fidgety/restless 1 1 0 0 0 0 0  Suicidal thoughts 1 0 0 0 0 0 0  PHQ-9 Score 11 4 0 0 2 0 0  Difficult doing work/chores  Somewhat difficult   Not difficult at all       Review of Systems  Constitutional: Negative.   HENT: Negative.    Eyes: Negative.   Respiratory: Negative.    Cardiovascular: Negative.   Gastrointestinal:  Positive for diarrhea.  Endocrine: Negative.   Genitourinary: Negative.   Musculoskeletal:  Positive for gait problem.  Skin: Negative.   Allergic/Immunologic: Negative.   Neurological:  Positive for tremors.  Hematological: Negative.   Psychiatric/Behavioral:  Positive for dysphoric mood.       Objective:   Physical Exam Gen: no distress, normal appearing HEENT: oral mucosa pink and moist, NCAT Cardio: Reg rate Chest: normal effort, normal rate  of breathing Abd: soft, non-distended Ext: no edema Psych: pleasant, normal affect Skin: intact, dry  and scaly Neuro: Alert and oriented x 3.  Fair insight and awareness.  Sporadic memory. Normal language and speech. Cranial nerve exam unremarkable.  Right upper extremity right lower extremity grossly 4-5 out of 5.  Left upper extremity 0 out of 5 at the shoulder and tricep 2 out of 5 biceps and 4 out of 5 wrist and finger flexors and extensors.  Left lower extremity is 1+ to 2- out of 5 at the hip flexors and knee extensors and trace out of 5 at the ankle.  There is no resting tone noticeable in the left lower extremity.  May have had trace tone in the right wrist and finger flexors but they all were easily movable.  Reflexes were increased on the left side.  Sensation slightly decreased to fine touch on the left. Musculoskeletal: Full ROM, No pain with AROM or PROM in the neck, trunk, or extremities.  Head forward posture and head also tilted towards the right.  Right sternocleidomastoid appeared tight.  Left knee with mild effusion and tenderness with palpation.  Ongoing left shoulder subluxation inferiorly and anteriorly.  About a quarter to half inch in length.  No real change from inpatient rehab      Assessment & Plan:   1.  Functional deficits secondary to parasagittal meningioma and subsequent resection with resulting left hemiparesis  -Patient still has profound left sided hemiparesis.  I did make referrals to outpatient therapy at [facility] neuro re[facility].  We will try physical and occupational therapies.  Focus will be on strength and range of motion as well as pregait activities.  Patient also needs help with head and shoulder girdle posture and strength. 2.  History of left posterior tibial DVT which resolved before leaving rehab 3.  Left shoulder subluxation: Patient has elevated armrest on his wheelchair.  He is to continue with elevation and support while in bed and chair. 4.  Mood:  Lexapro--continue this medication 5.  Urine retention with hematuria  -Foley catheter, urology follow-up 6.  History of multiple meningiomas and seizure risk:  -Keppra and Lamictal 7.  Diabetes type 2 8.  Spasticity of left upper and left lower extremity  -Baclofen seems to be maintaining him.  -Splinting-gave him prescription for left resting wrist hand orthosis 9.  Hypertension: Per primary 10.  Left knee pain: X-rays were ordered  -Provided prescription for diclofenac 50 mg twice daily with food  -Advised him to stop all ibuprofen  -Consider knee injection  Thirty minutes of face to face patient care time were spent during this visit. All questions were encouraged and answered. Follow up with me in 3 months.

## 2022-04-26 NOTE — Patient Instructions (Addendum)
PLEASE FEEL FREE TO CALL OUR OFFICE WITH ANY PROBLEMS OR QUESTIONS ([phone])   FOR CONSTIPATION:   MILK OF MAG DAILY  MIRALAX AS NEEDED OR SCHEDULED TOO      WRIST HAND ORTHOSIS CAN BE WORN AT NIGHT     DO NOT TAKE IBUPROFEN WITH THE NEW MEDICINE DICLOFENAC

## 2022-04-27 ENCOUNTER — Encounter (HOSPITAL_BASED_OUTPATIENT_CLINIC_OR_DEPARTMENT_OTHER): Payer: Self-pay

## 2022-04-27 ENCOUNTER — Other Ambulatory Visit: Payer: Self-pay | Admitting: *Deleted

## 2022-04-27 ENCOUNTER — Emergency Department (HOSPITAL_BASED_OUTPATIENT_CLINIC_OR_DEPARTMENT_OTHER)
Admission: EM | Admit: 2022-04-27 | Discharge: 2022-04-27 | Disposition: A | Discharge status: Home / Self Care | Payer: Medicare HMO | Source: Home / Self Care | Attending: Emergency Medicine | Admitting: Emergency Medicine

## 2022-04-27 ENCOUNTER — Telehealth: Payer: Self-pay | Admitting: Physician Assistant

## 2022-04-27 ENCOUNTER — Encounter (HOSPITAL_COMMUNITY): Payer: Self-pay | Admitting: Emergency Medicine

## 2022-04-27 ENCOUNTER — Other Ambulatory Visit: Payer: Self-pay

## 2022-04-27 ENCOUNTER — Inpatient Hospital Stay (HOSPITAL_COMMUNITY)
Admission: EM | Admit: 2022-04-27 | Discharge: 2022-05-13 | DRG: 054 | Disposition: A | Discharge status: Skilled Nursing Facility | Payer: Medicare HMO | Attending: Internal Medicine | Admitting: Internal Medicine

## 2022-04-27 ENCOUNTER — Telehealth: Payer: Self-pay

## 2022-04-27 ENCOUNTER — Emergency Department (HOSPITAL_COMMUNITY): Payer: Medicare HMO

## 2022-04-27 DIAGNOSIS — K219 Gastro-esophageal reflux disease without esophagitis: Secondary | ICD-10-CM | POA: Diagnosis present

## 2022-04-27 DIAGNOSIS — F32A Depression, unspecified: Secondary | ICD-10-CM | POA: Diagnosis present

## 2022-04-27 DIAGNOSIS — R338 Other retention of urine: Secondary | ICD-10-CM | POA: Diagnosis not present

## 2022-04-27 DIAGNOSIS — E1169 Type 2 diabetes mellitus with other specified complication: Secondary | ICD-10-CM | POA: Diagnosis not present

## 2022-04-27 DIAGNOSIS — D329 Benign neoplasm of meninges, unspecified: Secondary | ICD-10-CM | POA: Diagnosis not present

## 2022-04-27 DIAGNOSIS — G8114 Spastic hemiplegia affecting left nondominant side: Secondary | ICD-10-CM | POA: Diagnosis present

## 2022-04-27 DIAGNOSIS — R251 Tremor, unspecified: Secondary | ICD-10-CM | POA: Insufficient documentation

## 2022-04-27 DIAGNOSIS — S0501XA Injury of conjunctiva and corneal abrasion without foreign body, right eye, initial encounter: Secondary | ICD-10-CM | POA: Insufficient documentation

## 2022-04-27 DIAGNOSIS — E119 Type 2 diabetes mellitus without complications: Secondary | ICD-10-CM | POA: Diagnosis present

## 2022-04-27 DIAGNOSIS — M11261 Other chondrocalcinosis, right knee: Secondary | ICD-10-CM | POA: Diagnosis present

## 2022-04-27 DIAGNOSIS — Z833 Family history of diabetes mellitus: Secondary | ICD-10-CM

## 2022-04-27 DIAGNOSIS — R4689 Other symptoms and signs involving appearance and behavior: Secondary | ICD-10-CM | POA: Diagnosis not present

## 2022-04-27 DIAGNOSIS — R339 Retention of urine, unspecified: Secondary | ICD-10-CM | POA: Diagnosis present

## 2022-04-27 DIAGNOSIS — Z794 Long term (current) use of insulin: Secondary | ICD-10-CM | POA: Diagnosis not present

## 2022-04-27 DIAGNOSIS — T380X5A Adverse effect of glucocorticoids and synthetic analogues, initial encounter: Secondary | ICD-10-CM | POA: Diagnosis present

## 2022-04-27 DIAGNOSIS — H5710 Ocular pain, unspecified eye: Secondary | ICD-10-CM | POA: Diagnosis not present

## 2022-04-27 DIAGNOSIS — Z993 Dependence on wheelchair: Secondary | ICD-10-CM

## 2022-04-27 DIAGNOSIS — Z87891 Personal history of nicotine dependence: Secondary | ICD-10-CM

## 2022-04-27 DIAGNOSIS — X58XXXA Exposure to other specified factors, initial encounter: Secondary | ICD-10-CM | POA: Insufficient documentation

## 2022-04-27 DIAGNOSIS — J309 Allergic rhinitis, unspecified: Secondary | ICD-10-CM | POA: Diagnosis present

## 2022-04-27 DIAGNOSIS — D420 Neoplasm of uncertain behavior of cerebral meninges: Principal | ICD-10-CM | POA: Diagnosis present

## 2022-04-27 DIAGNOSIS — L89322 Pressure ulcer of left buttock, stage 2: Secondary | ICD-10-CM | POA: Diagnosis present

## 2022-04-27 DIAGNOSIS — Z86011 Personal history of benign neoplasm of the brain: Secondary | ICD-10-CM

## 2022-04-27 DIAGNOSIS — R531 Weakness: Secondary | ICD-10-CM | POA: Insufficient documentation

## 2022-04-27 DIAGNOSIS — K5900 Constipation, unspecified: Secondary | ICD-10-CM | POA: Diagnosis present

## 2022-04-27 DIAGNOSIS — R29810 Facial weakness: Secondary | ICD-10-CM | POA: Diagnosis present

## 2022-04-27 DIAGNOSIS — R569 Unspecified convulsions: Secondary | ICD-10-CM | POA: Diagnosis present

## 2022-04-27 DIAGNOSIS — G936 Cerebral edema: Secondary | ICD-10-CM | POA: Diagnosis present

## 2022-04-27 DIAGNOSIS — Z6827 Body mass index (BMI) 27.0-27.9, adult: Secondary | ICD-10-CM

## 2022-04-27 DIAGNOSIS — R258 Other abnormal involuntary movements: Secondary | ICD-10-CM | POA: Diagnosis present

## 2022-04-27 DIAGNOSIS — G9341 Metabolic encephalopathy: Secondary | ICD-10-CM | POA: Diagnosis present

## 2022-04-27 DIAGNOSIS — I10 Essential (primary) hypertension: Secondary | ICD-10-CM | POA: Diagnosis present

## 2022-04-27 DIAGNOSIS — R45851 Suicidal ideations: Secondary | ICD-10-CM | POA: Diagnosis present

## 2022-04-27 DIAGNOSIS — Z79899 Other long term (current) drug therapy: Secondary | ICD-10-CM

## 2022-04-27 DIAGNOSIS — R627 Adult failure to thrive: Secondary | ICD-10-CM | POA: Diagnosis present

## 2022-04-27 DIAGNOSIS — F419 Anxiety disorder, unspecified: Secondary | ICD-10-CM | POA: Diagnosis present

## 2022-04-27 DIAGNOSIS — Z7401 Bed confinement status: Secondary | ICD-10-CM

## 2022-04-27 DIAGNOSIS — R4587 Impulsiveness: Secondary | ICD-10-CM | POA: Diagnosis present

## 2022-04-27 DIAGNOSIS — Z86718 Personal history of other venous thrombosis and embolism: Secondary | ICD-10-CM

## 2022-04-27 DIAGNOSIS — D649 Anemia, unspecified: Secondary | ICD-10-CM | POA: Diagnosis not present

## 2022-04-27 LAB — CBC WITH DIFFERENTIAL/PLATELET
Abs Immature Granulocytes: 0.03 10*3/uL (ref 0.00–0.07)
Basophils Absolute: 0 10*3/uL (ref 0.0–0.1)
Basophils Relative: 0 %
Eosinophils Absolute: 0.1 10*3/uL (ref 0.0–0.5)
Eosinophils Relative: 1 %
HCT: 37.1 % — ABNORMAL LOW (ref 39.0–52.0)
Hemoglobin: 11.9 g/dL — ABNORMAL LOW (ref 13.0–17.0)
Immature Granulocytes: 0 %
Lymphocytes Relative: 12 %
Lymphs Abs: 1 10*3/uL (ref 0.7–4.0)
MCH: 29.4 pg (ref 26.0–34.0)
MCHC: 32.1 g/dL (ref 30.0–36.0)
MCV: 91.6 fL (ref 80.0–100.0)
Monocytes Absolute: 0.7 10*3/uL (ref 0.1–1.0)
Monocytes Relative: 9 %
Neutro Abs: 6.4 10*3/uL (ref 1.7–7.7)
Neutrophils Relative %: 78 %
Platelets: 345 10*3/uL (ref 150–400)
RBC: 4.05 MIL/uL — ABNORMAL LOW (ref 4.22–5.81)
RDW: 14.2 % (ref 11.5–15.5)
WBC: 8.2 10*3/uL (ref 4.0–10.5)
nRBC: 0 % (ref 0.0–0.2)

## 2022-04-27 LAB — BASIC METABOLIC PANEL
Anion gap: 12 (ref 5–15)
BUN: 12 mg/dL (ref 8–23)
CO2: 23 mmol/L (ref 22–32)
Calcium: 9.1 mg/dL (ref 8.9–10.3)
Chloride: 106 mmol/L (ref 98–111)
Creatinine, Ser: 0.67 mg/dL (ref 0.61–1.24)
GFR, Estimated: >60 mL/min (ref >60)
Glucose, Bld: 79 mg/dL (ref 70–99)
Potassium: 4 mmol/L (ref 3.5–5.1)
Sodium: 141 mmol/L (ref 135–145)

## 2022-04-27 LAB — URINALYSIS, ROUTINE W REFLEX MICROSCOPIC
Bilirubin Urine: NEGATIVE
Glucose, UA: NEGATIVE mg/dL
Ketones, ur: 80 mg/dL — AB
Nitrite: NEGATIVE
Protein, ur: 30 mg/dL — AB
RBC / HPF: >50 RBC/hpf — ABNORMAL HIGH (ref 0–5)
Specific Gravity, Urine: 1.018 (ref 1.005–1.030)
WBC, UA: >50 WBC/hpf — ABNORMAL HIGH (ref 0–5)
pH: 5 (ref 5.0–8.0)

## 2022-04-27 LAB — HEPATIC FUNCTION PANEL
ALT: 14 U/L (ref 0–44)
AST: 14 U/L — ABNORMAL LOW (ref 15–41)
Albumin: 3.5 g/dL (ref 3.5–5.0)
Alkaline Phosphatase: 81 U/L (ref 38–126)
Bilirubin, Direct: <0.1 mg/dL (ref 0.0–0.2)
Total Bilirubin: 0.8 mg/dL (ref 0.3–1.2)
Total Protein: 6.5 g/dL (ref 6.5–8.1)

## 2022-04-27 LAB — C-REACTIVE PROTEIN: CRP: 0.6 mg/dL (ref <1.0)

## 2022-04-27 LAB — PHOSPHORUS: Phosphorus: 3.7 mg/dL (ref 2.5–4.6)

## 2022-04-27 LAB — MAGNESIUM: Magnesium: 1.9 mg/dL (ref 1.7–2.4)

## 2022-04-27 LAB — AMMONIA: Ammonia: 19 umol/L (ref 9–35)

## 2022-04-27 LAB — SEDIMENTATION RATE: Sed Rate: 26 mm/hr — ABNORMAL HIGH (ref 0–16)

## 2022-04-27 MED ORDER — IOHEXOL 300 MG/ML  SOLN
75.0000 mL | Freq: Once | INTRAMUSCULAR | Status: AC | PRN
  Administered 2022-04-27: 75 mL via INTRAVENOUS

## 2022-04-27 MED ORDER — SENNOSIDES-DOCUSATE SODIUM 8.6-50 MG PO TABS
2.0000 | ORAL_TABLET | Freq: Every day | ORAL | Status: DC
  Administered 2022-04-28 – 2022-05-11 (×14): 2 via ORAL
  Filled 2022-04-27 (×15): qty 2

## 2022-04-27 MED ORDER — MELATONIN 5 MG PO TABS
5.0000 mg | ORAL_TABLET | Freq: Once | ORAL | Status: AC
Start: 2022-04-28 — End: 2022-04-28
  Administered 2022-04-28: 5 mg via ORAL
  Filled 2022-04-27: qty 1

## 2022-04-27 MED ORDER — POLYVINYL ALCOHOL 1.4 % OP SOLN
1.0000 [drp] | OPHTHALMIC | Status: DC | PRN
  Administered 2022-04-28: 1 [drp] via OPHTHALMIC
  Filled 2022-04-27: qty 15

## 2022-04-27 MED ORDER — LAMOTRIGINE 100 MG PO TABS
100.0000 mg | ORAL_TABLET | Freq: Two times a day (BID) | ORAL | Status: DC
  Administered 2022-04-28 – 2022-05-13 (×32): 100 mg via ORAL
  Filled 2022-04-27 (×32): qty 1

## 2022-04-27 MED ORDER — TRAZODONE HCL 50 MG PO TABS
25.0000 mg | ORAL_TABLET | Freq: Every evening | ORAL | Status: DC | PRN
  Administered 2022-04-28 – 2022-05-12 (×12): 50 mg via ORAL
  Filled 2022-04-27 (×12): qty 1

## 2022-04-27 MED ORDER — HYDROCODONE-ACETAMINOPHEN 5-325 MG PO TABS
1.0000 | ORAL_TABLET | ORAL | Status: DC | PRN
  Administered 2022-04-28 – 2022-05-13 (×18): 1 via ORAL
  Filled 2022-04-27 (×22): qty 1

## 2022-04-27 MED ORDER — DOCUSATE SODIUM 100 MG PO CAPS
100.0000 mg | ORAL_CAPSULE | Freq: Two times a day (BID) | ORAL | Status: DC
Start: 2022-04-27 — End: 2022-05-07
  Administered 2022-04-28 – 2022-05-06 (×19): 100 mg via ORAL
  Filled 2022-04-27 (×19): qty 1

## 2022-04-27 MED ORDER — BACLOFEN 10 MG PO TABS
10.0000 mg | ORAL_TABLET | Freq: Three times a day (TID) | ORAL | Status: DC
  Administered 2022-04-28 – 2022-05-13 (×47): 10 mg via ORAL
  Filled 2022-04-27 (×47): qty 1

## 2022-04-27 MED ORDER — ACETAMINOPHEN 650 MG RE SUPP: 650.0000 mg | Freq: Four times a day (QID) | RECTAL | Status: DC | PRN

## 2022-04-27 MED ORDER — CIPROFLOXACIN HCL 0.3 % OP SOLN
2.0000 [drp] | OPHTHALMIC | Status: DC
  Administered 2022-04-27: 2 [drp] via OPHTHALMIC
  Filled 2022-04-27: qty 2.5

## 2022-04-27 MED ORDER — PANTOPRAZOLE SODIUM 40 MG PO TBEC
40.0000 mg | DELAYED_RELEASE_TABLET | Freq: Every day | ORAL | Status: DC
  Administered 2022-04-28 – 2022-05-12 (×16): 40 mg via ORAL
  Filled 2022-04-27 (×17): qty 1

## 2022-04-27 MED ORDER — SODIUM CHLORIDE 0.9 % IV SOLN: INTRAVENOUS | Status: AC

## 2022-04-27 MED ORDER — ACETAMINOPHEN 325 MG PO TABS
650.0000 mg | ORAL_TABLET | Freq: Four times a day (QID) | ORAL | Status: DC | PRN
  Administered 2022-05-05: 650 mg via ORAL
  Administered 2022-05-06: 325 mg via ORAL
  Administered 2022-05-09 (×3): 650 mg via ORAL
  Administered 2022-05-10: 325 mg via ORAL
  Filled 2022-04-27 (×8): qty 2

## 2022-04-27 MED ORDER — TETRACAINE HCL 0.5 % OP SOLN
2.0000 [drp] | Freq: Once | OPHTHALMIC | Status: AC
  Administered 2022-04-27: 2 [drp] via OPHTHALMIC
  Filled 2022-04-27: qty 4

## 2022-04-27 MED ORDER — LEVETIRACETAM 500 MG PO TABS
500.0000 mg | ORAL_TABLET | Freq: Two times a day (BID) | ORAL | Status: DC
  Administered 2022-04-28 – 2022-04-29 (×5): 500 mg via ORAL
  Filled 2022-04-27 (×5): qty 1

## 2022-04-27 MED ORDER — FLUORESCEIN SODIUM 1 MG OP STRP
1.0000 | ORAL_STRIP | Freq: Once | OPHTHALMIC | Status: AC
  Administered 2022-04-27: 1 via OPHTHALMIC
  Filled 2022-04-27: qty 1

## 2022-04-27 MED ORDER — ROSUVASTATIN CALCIUM 20 MG PO TABS
20.0000 mg | ORAL_TABLET | Freq: Every day | ORAL | Status: DC
  Administered 2022-04-28 – 2022-05-13 (×16): 20 mg via ORAL
  Filled 2022-04-27 (×16): qty 1

## 2022-04-27 MED ORDER — INSULIN ASPART 100 UNIT/ML IJ SOLN
0.0000 [IU] | INTRAMUSCULAR | Status: DC
  Filled 2022-04-27: qty 0.09

## 2022-04-27 MED ORDER — MELATONIN 3 MG PO TABS
3.0000 mg | ORAL_TABLET | Freq: Once | ORAL | Status: DC
Start: 2022-04-28 — End: 2022-04-27

## 2022-04-27 MED ORDER — BISACODYL 10 MG RE SUPP
10.0000 mg | Freq: Every day | RECTAL | Status: DC | PRN
  Administered 2022-05-04 – 2022-05-07 (×2): 10 mg via RECTAL
  Filled 2022-04-27 (×2): qty 1

## 2022-04-27 MED ORDER — GABAPENTIN 100 MG PO CAPS
100.0000 mg | ORAL_CAPSULE | Freq: Two times a day (BID) | ORAL | Status: DC
  Administered 2022-04-28 – 2022-05-13 (×32): 100 mg via ORAL
  Filled 2022-04-27 (×32): qty 1

## 2022-04-27 MED ORDER — ESCITALOPRAM OXALATE 20 MG PO TABS
20.0000 mg | ORAL_TABLET | Freq: Every day | ORAL | Status: DC
  Administered 2022-04-28 – 2022-05-13 (×16): 20 mg via ORAL
  Filled 2022-04-27 (×6): qty 1
  Filled 2022-04-27: qty 2
  Filled 2022-04-27 (×9): qty 1

## 2022-04-27 MED ORDER — TAMSULOSIN HCL 0.4 MG PO CAPS
0.4000 mg | ORAL_CAPSULE | Freq: Every day | ORAL | Status: DC
  Administered 2022-04-28 – 2022-05-12 (×15): 0.4 mg via ORAL
  Filled 2022-04-27 (×15): qty 1

## 2022-04-27 MED ORDER — DEXAMETHASONE SODIUM PHOSPHATE 4 MG/ML IJ SOLN
2.0000 mg | Freq: Three times a day (TID) | INTRAMUSCULAR | Status: DC
  Administered 2022-04-27 – 2022-05-04 (×20): 2 mg via INTRAVENOUS
  Filled 2022-04-27 (×20): qty 1

## 2022-04-27 NOTE — ED Notes (Signed)
Called daughter [name] at patient request.  Informed her of discharge instructions.  Daughter plans on meeting patient at his home.

## 2022-04-27 NOTE — Assessment & Plan Note (Signed)
Most likely in the setting of brain edema secondary to growing meningioma. Neurosurgery is aware Plan to transfer to [facility] Decadron 2 mg every 8 hours Continue seizure prophylaxis with Keppra 500 mg twice daily and Lamictal 400 mg twice daily

## 2022-04-27 NOTE — Assessment & Plan Note (Signed)
Chronic stable appreciate neurosurgery consult

## 2022-04-27 NOTE — Discharge Instructions (Signed)
Please use eyedrops every 4 hours while awake Please recheck with eye doctor in 1 to 2 days Return if you are having worsening symptoms especially change in vision or worsening pain

## 2022-04-27 NOTE — Assessment & Plan Note (Signed)
patient is status post patient is status post Foley indwelling secondary to urinary retention Started on Flomax Patient is interested to have his discontinued. Can attempt to do so prior to discharge and see if able to urinate

## 2022-04-27 NOTE — ED Notes (Signed)
When the patient was asked if he still wanted to harm himself, he appeared confused. When the writer told him what she was told in report, be replied he didn't want to harm himself and never did.

## 2022-04-27 NOTE — H&P (Signed)
Patrick Buchanan XIP:[id number] DOB: January 08, 1951 DOA: 04/27/2022     PCP: Lorrene Reid, PA-C   Outpatient Specialists:    NEurology     Dr. Krista Blue   Neurosurgery -Pool Patient arrived to ER on 04/27/22 at 1505 Referred by Attending Toy Baker, MD   Patient coming from:    home Lives alone,   With CAREGIVER    Chief Complaint:   Chief Complaint  Patient presents with   Psychiatric Evaluation   Suicidal    HPI: Patrick Buchanan is a 71 y.o. male with medical history significant of meningioma sp resection now with left hemiparesis, constipation, DM 2 hx of seizures    Presented with acute encephalopathy Patient for the past few weeks has been getting progressively more irritable Earlier today he was having some eye irritation was seen in the emergency department Also was noted at home while on the phone with his best friend he did mention but if his friend had a gun that he should bring it over and shoot him Later on patient states this was a joke But he was transferred to emergency department for suicidal ideation evaluation. Since meningioma surgery in May patient have had left dense hemiparesis which has actually gotten slightly better. He denies any fevers or chills Patient is wheelchair-bound and needs were lift to transfer He has multiple caregivers at home 24/7 Denies any suicidal ideations states that he feels that suicide is a mortal's and this was clearly a joke and he would never think of doing anything like this. States that there is no guns in the house order has been removed and he will be physically unable to reach for them given his significant left-sided weakness Patient has indwelling Foley because he was very constipated and unable to urinate he went to urologist but states that appointment was canceled and he was not able to be seen but he is interested in having the Foley taken out       Regarding pertinent Chronic problems:    Seizure  prophylaxis on lamictal and keppra  Gerd on protonix      DM 2 -  Lab Results  Component Value Date   HGBA1C 5.7 (H) 02/22/2022   diet controlled   Chronic anemia - baseline hg Hemoglobin & Hematocrit  Recent Labs    04/01/22 1231 04/10/22 1153 04/27/22 1701  HGB 11.3* 10.9* 11.9*     While in ER:   CT head worrisome for abscess discussed with neurosurgery who feels that abscess less likely but there is evidence of increased edema around knowing meningiomas on the right plan for initiation of steroids and admit for observation to [facility] for [facility]surgery to see    Ordered  CT HEAD Postop right parietal convexity craniotomy for tumor resection. There is a rim enhancing fluid collection containing gas at the surgical site, suspicious for abscess. This measures approximately 25 x 13 mm.   Numerous residual enhancing meningioma are noted bilaterally. There is edema in the left frontal lobe left parietal lobe with mild midline shift to the right.     Following Medications were ordered in ER: Medications  dexamethasone (DECADRON) injection 2 mg (has no administration in time range)  iohexol (OMNIPAQUE) 300 MG/ML solution 75 mL (75 mLs Intravenous Contrast Given 04/27/22 1928)    _______________________________________________________ ER Provider Called:  neurosurgery   Dr Ellene Route They Recommend admit to medicine  start on low does of decadron and admit to [facility], doubt true abcess Will  see in AM   once at [facility]   ED Triage Vitals  Enc Vitals Group     BP 04/27/22 1517 (!) 128/92     Pulse Rate 04/27/22 1517 95     Resp 04/27/22 1517 18     Temp 04/27/22 1517 99.1 F (37.3 C)     Temp Source 04/27/22 1517 Oral     SpO2 04/27/22 1517 94 %     Weight --      Height --      Head Circumference --      Peak Flow --      Pain Score 04/27/22 1558 0     Pain Loc --      Pain Edu? --      Excl. in [place]? --   TMAX(24)@      _________________________________________ Significant initial  Findings: Abnormal Labs Reviewed  CBC WITH DIFFERENTIAL/PLATELET - Abnormal; Notable for the following components:      Result Value   RBC 4.05 (*)    Hemoglobin 11.9 (*)    HCT 37.1 (*)    All other components within normal limits  URINALYSIS, ROUTINE W REFLEX MICROSCOPIC - Abnormal; Notable for the following components:   APPearance CLOUDY (*)    Hgb urine dipstick MODERATE (*)    Ketones, ur 80 (*)    Protein, ur 30 (*)    Leukocytes,Ua LARGE (*)    RBC / HPF >50 (*)    WBC, UA >50 (*)    Bacteria, UA FEW (*)    All other components within normal limits         The recent clinical data is shown below. Vitals:   04/27/22 1517 04/27/22 1710 04/27/22 1851 04/27/22 2008  BP: (!) 128/92 (!) 146/86 (!) 140/101 130/79  Pulse: 95 100 (!) 122 97  Resp: '18 18 18 16  '$ Temp: 99.1 F (37.3 C)   98.8 F (37.1 C)  TempSrc: Oral   Oral  SpO2: 94% 100% 93% 95%    WBC     Component Value Date/Time   WBC 8.2 04/27/2022 1701   LYMPHSABS 1.0 04/27/2022 1701   LYMPHSABS 1.1 08/23/2021 0906   MONOABS 0.7 04/27/2022 1701   EOSABS 0.1 04/27/2022 1701   EOSABS 0.1 08/23/2021 0906   BASOSABS 0.0 04/27/2022 1701   BASOSABS 0.0 08/23/2021 0906       UA  abnormal UA in a setting of indwelling foley   Urine analysis:    Component Value Date/Time   COLORURINE YELLOW 04/27/2022 1816   APPEARANCEUR CLOUDY (A) 04/27/2022 1816   LABSPEC 1.018 04/27/2022 1816   PHURINE 5.0 04/27/2022 1816   GLUCOSEU NEGATIVE 04/27/2022 1816   HGBUR MODERATE (A) 04/27/2022 1816   BILIRUBINUR NEGATIVE 04/27/2022 1816   KETONESUR 80 (A) 04/27/2022 1816   PROTEINUR 30 (A) 04/27/2022 1816   NITRITE NEGATIVE 04/27/2022 1816   LEUKOCYTESUR LARGE (A) 04/27/2022 1816    Results for orders placed or performed during the hospital encounter of 04/10/22  Urine Culture     Status: Abnormal   Collection Time: 04/10/22 10:56 AM   Specimen: Urine,  Catheterized  Result Value Ref Range Status   Specimen Description   Final    URINE, CATHETERIZED Performed at [facility], [place] [address] [place], [place] [address]    Special Requests   Final    NONE Performed at [facility], [place] [address], [place], [place] [address]    Culture (A)  Final    >=  100,000 COLONIES/mL AEROCOCCUS URINAE Standardized susceptibility testing for this organism is not available. Performed at [facility], [place] [address] [place], [place] [address]    Report Status 04/13/2022 FINAL  Final   ______________________________________ Hospitalist was called for admission for  brain edema and acute encephalopathy   The following Work up has been ordered so far:  Orders Placed This Encounter  Procedures   CT Head W or Wo Contrast   CBC with Differential   Basic metabolic panel   Urinalysis, Routine w reflex microscopic   Cardiac monitoring   Consult to Transition of Care Team (SW and CM)   Consult to neurosurgery   Consult to hospitalist   Saline lock IV   Place in observation (patient's expected length of stay will be less than 2 midnights)   Precautions Type: Suicide; C-SSRS Risk Category: No Risk     OTHER Significant initial  Findings:  labs showing:   Recent Labs  Lab 04/27/22 1701  NA 141  K 4.0  CO2 23  GLUCOSE 79  BUN 12  CREATININE 0.67  CALCIUM 9.1    Cr stable,    Lab Results  Component Value Date   CREATININE 0.67 04/27/2022   CREATININE 0.67 04/10/2022   CREATININE 0.74 04/01/2022    No results for input(s): "AST", "ALT", "ALKPHOS", "BILITOT", "PROT", "ALBUMIN" in the last 168 hours. Lab Results  Component Value Date   CALCIUM 9.1 04/27/2022       Low Plt: Lab Results  Component Value Date   PLT 345 04/27/2022       Recent Labs  Lab 04/27/22 1701  WBC 8.2  NEUTROABS 6.4  HGB 11.9*  HCT 37.1*  MCV 91.6  PLT 345    HG/HCT  stable,     Component Value  Date/Time   HGB 11.9 (L) 04/27/2022 1701   HGB 15.2 08/23/2021 0906   HCT 37.1 (L) 04/27/2022 1701   HCT 44.7 08/23/2021 0906   MCV 91.6 04/27/2022 1701   MCV 89 08/23/2021 0906        DM  labs:  HbA1C: Recent Labs    08/23/21 0906 02/22/22 1455  HGBA1C 6.6* 5.7*       CBG (last 3)  No results for input(s): "GLUCAP" in the last 72 hours.        Cultures:    Component Value Date/Time   SDES  04/10/2022 1056    URINE, CATHETERIZED Performed at [facility] [address], [place], [place] [address]    SPECREQUEST  04/10/2022 1056    NONE Performed at [facility], [place] [address], [place], [place] [address]    CULT (A) 04/10/2022 1056    >=100,000 COLONIES/mL AEROCOCCUS URINAE Standardized susceptibility testing for this organism is not available. Performed at [facility], [place] [address], [place], [place] [address]    REPTSTATUS 04/13/2022 FINAL 04/10/2022 1056     Radiological Exams on Admission: CT Head W or Wo Contrast  Result Date: 04/27/2022 CLINICAL DATA:  Delirium. Recent craniotomy for meningioma resection. EXAM: CT HEAD WITHOUT AND WITH CONTRAST TECHNIQUE: Contiguous axial images were obtained from the base of the skull through the vertex without and with intravenous contrast. RADIATION DOSE REDUCTION: This exam was performed according to the departmental dose-optimization program which includes automated exposure control, adjustment of the mA and/or kV according to patient size and/or use of iterative reconstruction technique. CONTRAST:  49m OMNIPAQUE IOHEXOL 300 MG/ML  SOLN COMPARISON:  CT  head 02/28/2022 FINDINGS: Brain: Postop right convexity craniotomy for tumor resection. Large enhancing lesion in the right parietal convexity has been resected. There is irregular rim enhancement of a fluid collection at the surgical site which could represent abscess. This contains gas and appears extra-axial. Multiple additional  enhancing masses compatible with meningioma. These are stable from prior studies. There is edema in the left frontal lobe and left parietal lobe due to enhancing mass lesions. Ventricle size normal. 5 mm midline shift to the right due to mass-effect. Vascular: Aneurysm clipping in the right posterior communicating artery region. Skull: Right convexity craniotomy.  Right temporal craniotomy. Sinuses/Orbits: Paranasal sinuses clear.  Negative orbit Other: None IMPRESSION: Postop right parietal convexity craniotomy for tumor resection. There is a rim enhancing fluid collection containing gas at the surgical site, suspicious for abscess. This measures approximately 25 x 13 mm. Numerous residual enhancing meningioma are noted bilaterally. There is edema in the left frontal lobe left parietal lobe with mild midline shift to the right. These results were called by telephone at the time of interpretation on 04/27/2022 at 8:00 pm to provider [facility] , who verbally acknowledged these results. Electronically Signed   By: Franchot Gallo M.D.   On: 04/27/2022 20:00   _______________________________________________________________________________________________________ Latest  Blood pressure 130/79, pulse 97, temperature 98.8 F (37.1 C), temperature source Oral, resp. rate 16, SpO2 95 %.   Vitals  labs and radiology finding personally reviewed  Review of Systems:    Pertinent positives include: left sided weakness  change in mood or affect. Constitutional:  No weight loss, night sweats, Fevers, chills, fatigue, weight loss  HEENT:  No headaches, Difficulty swallowing,Tooth/dental problems,Sore throat,  No sneezing, itching, ear ache, nasal congestion, post nasal drip,  Cardio-vascular:  No chest pain, Orthopnea, PND, anasarca, dizziness, palpitations.no Bilateral lower extremity swelling  GI:  No heartburn, indigestion, abdominal pain, nausea, vomiting, diarrhea, change in bowel habits, loss of appetite,  melena, blood in stool, hematemesis Resp:  no shortness of breath at rest. No dyspnea on exertion, No excess mucus, no productive cough, No non-productive cough, No coughing up of blood.No change in color of mucus.No wheezing. Skin:  no rash or lesions. No jaundice GU:  no dysuria, change in color of urine, no urgency or frequency. No straining to urinate.  No flank pain.  Musculoskeletal:  No joint pain or no joint swelling. No decreased range of motion. No back pain.  Psych:  No  No depression or anxiety. No memory loss.  Neuro: no localizing neurological complaints, no tingling, no weakness, no double vision, no gait abnormality, no slurred speech, no confusion  All systems reviewed and apart from [place] all are negative _______________________________________________________________________________________________ Past Medical History:   Past Medical History:  Diagnosis Date   Allergy    hay fever   Blood transfusion without reported diagnosis    Cerebral aneurysm 1992   "leaking"; treated by Dr Sherwood Gambler   Depression    Diabetes mellitus without complication ([place])    type 2   GERD (gastroesophageal reflux disease)    past hx       Past Surgical History:  Procedure Laterality Date   APPENDECTOMY     CEREBRAL ANEURYSM REPAIR  1992   clipping by Dr Sherwood Gambler; cannot have an MRI   CRANIOTOMY Bilateral 02/27/2022   Procedure: Craniotomy - bilateral - Parietal;  Surgeon: Earnie Larsson, MD;  Location: [facility];  Service: Neurosurgery;  Laterality: Bilateral;   HERNIA REPAIR N/A 1990   inguinal  INGUINAL HERNIA REPAIR     as a child and then another at 48 yrs old   TONSILLECTOMY     TUMOR EXCISION  2005   benign cervical    Social History:  Ambulatory  wheelchair bound, bed bound     reports that he quit smoking about 18 years ago. His smoking use included cigarettes. He has a 10.00 pack-year smoking history. He has never used smokeless tobacco. He reports that he does not  currently use alcohol. He reports that he does not currently use drugs.     Family History:   Family History  Problem Relation Age of Onset   Breast cancer Mother    Diabetes Brother    Colon cancer Neg Hx    Colon polyps Neg Hx    Esophageal cancer Neg Hx    Rectal cancer Neg Hx    Stomach cancer Neg Hx    ______________________________________________________________________________________________ Allergies: Allergies  Allergen Reactions   Other Itching and Other (See Comments)    Hay fever- Itchy eyes, runny nose, congestion- no breathing issues, however     Prior to Admission medications   Medication Sig Start Date End Date Taking? Authorizing Provider  baclofen (LIORESAL) 10 MG tablet Take 1 tablet (10 mg total) by mouth 3 (three) times daily. 04/24/22   Ronnell Freshwater, NP  calcium carbonate (TUMS - DOSED IN MG ELEMENTAL CALCIUM) 500 MG chewable tablet Chew 1 tablet (200 mg of elemental calcium total) by mouth 3 (three) times daily with meals. 03/30/22   Setzer, Edman Circle, PA-C  chlorpheniramine (CHLOR-TRIMETON) 4 MG tablet Take 4 mg by mouth 2 (two) times daily as needed for allergies.    [provider]  diclofenac (VOLTAREN) 50 MG EC tablet Take 1 tablet (50 mg total) by mouth 2 (two) times daily. 04/26/22   Meredith Staggers, MD  diclofenac Sodium (VOLTAREN) 1 % GEL Apply 2 g topically 3 (three) times daily. 03/30/22   Setzer, Edman Circle, PA-C  escitalopram (LEXAPRO) 20 MG tablet Take 1 tablet (20 mg total) by mouth daily. 04/24/22   Ronnell Freshwater, NP  gabapentin (NEURONTIN) 100 MG capsule Take 1 capsule (100 mg total) by mouth 2 (two) times daily. 04/24/22   Ronnell Freshwater, NP  HYDROcodone-acetaminophen (NORCO/VICODIN) 5-325 MG tablet Take 1 tablet by mouth every 4 (four) hours as needed for moderate pain. 03/30/22   Setzer, Edman Circle, PA-C  lamoTRIgine (LAMICTAL) 100 MG tablet Take 1 tablet (100 mg total) by mouth 2 (two) times daily. 04/24/22   Ronnell Freshwater, NP   levETIRAcetam (KEPPRA) 500 MG tablet Take 1 tablet (500 mg total) by mouth 2 (two) times daily. 04/24/22   Ronnell Freshwater, NP  linaclotide (LINZESS) 145 MCG CAPS capsule Take 1 capsule (145 mcg total) by mouth daily before breakfast. 04/24/22   Ronnell Freshwater, NP  magnesium gluconate (MAGONATE) 500 MG tablet Take 0.5 tablets (250 mg total) by mouth at bedtime. 04/24/22   Ronnell Freshwater, NP  Multiple Vitamin (MULTIVITAMIN) tablet Take 1 tablet by mouth daily.    [provider]  pantoprazole (PROTONIX) 40 MG tablet Take 1 tablet (40 mg total) by mouth at bedtime. 04/24/22   Ronnell Freshwater, NP  polyvinyl alcohol (LIQUIFILM TEARS) 1.4 % ophthalmic solution Place 1 drop into both eyes as needed for dry eyes.    [provider]  rosuvastatin (CRESTOR) 20 MG tablet Take 1 tablet (20 mg total) by mouth daily. 04/24/22   Boscia,  Heather E, NP  senna-docusate (SENOKOT-S) 8.6-50 MG tablet Take 2 tablets by mouth at bedtime. 03/30/22   Setzer, Edman Circle, PA-C  traZODone (DESYREL) 50 MG tablet Take 0.5-1 tablets (25-50 mg total) by mouth at bedtime as needed for sleep. 04/24/22   Ronnell Freshwater, NP  Vitamin D, Ergocalciferol, (DRISDOL) 1.25 MG (50000 UNIT) CAPS capsule Take 1 capsule (50,000 Units total) by mouth every 'Sunday. Take one tablet wkly 04/30/22   Boscia, Heather E, NP    ___________________________________________________________________________________________________ Physical Exam:    04/27/2022    8:08 PM 04/27/2022    6:51 PM 04/27/2022    5:10 PM  Vitals with BMI  Systolic 130 140 146  Diastolic 79 101 86  Pulse 97 122 100     1. General:  in No  Acute distress    Chronically ill   -appearing 2. Psychological: Alert and   Oriented 3. Head/ENT:     Dry Mucous Membranes                          Head Non traumatic, neck supple                          Normal  Dentition 4. SKIN:  decreased Skin turgor,  Skin clean Dry and intact no rash 5. Heart: Regular rate and rhythm  no  Murmur, no Rub or gallop 6. Lungs:  Clear to auscultation bilaterally, no wheezes or crackles   7. Abdomen: Soft,  non-tender, Non distended bowel sounds present, foley in place 8. Lower extremities: no clubbing, cyanosis, no  edema 9. Neurologically significant weakness of the left side  10. MSK: Normal range of motion    Chart has been reviewed  ______________________________________________________________________________________________  Assessment/Plan              70'$  y.o. male with medical history significant of meningioma sp resection now with left hemiparesis, constipation, DM 2 hx of seizures  Admitted for brain edema in the setting of known meningioma   Present on Admission:  Acute metabolic encephalopathy  Spastic hemiparesis of left nondominant side ([place])  Meningioma (HCC)  Acute urinary retention     Acute metabolic encephalopathy Most likely in the setting of brain edema secondary to growing meningioma. Neurosurgery is aware Plan to transfer to [facility] Decadron 2 mg every 8 hours Continue seizure prophylaxis with Keppra 500 mg twice daily and Lamictal 400 mg twice daily  Diabetes mellitus ([place]) Diet controlled.  Monitor blood sugar Order sent stable sliding scale  Spastic hemiparesis of left nondominant side (HCC) Chronic stable appreciate neurosurgery consult  Meningioma [facility]) CT scan was initially worrisome for possible abscess  This was discussed with neurosurgery who thinks that there is abscess less likely.  We will see in consult on arrival to [facility]  Acute urinary retention patient is status post patient is status post Foley indwelling secondary to urinary retention Started on Flomax Patient is interested to have his discontinued. Can attempt to do so prior to discharge and see if able to urinate    Other plan as per orders.  DVT prophylaxis:  SCD      Code Status:    Code Status: Prior FULL CODE   as per patient   I had  personally discussed CODE STATUS with patient      Family Communication:   Family not at  Bedside    Disposition Plan:  To home once workup is complete and patient is stable   Following barriers for discharge:                                                     Will need consultants to evaluate patient prior to discharge                        Would benefit from PT/OT eval prior to DC  Ordered                   Swallow eval - SLP ordered                                        Consults called: neurosurgery   Admission status:  ED Disposition     ED Disposition  [place]: [place] [[id number]]  Level of Care: Telemetry Medical [104]  May place patient in observation at [facility] or [place] if equivalent level of care is available:: No  Covid Evaluation: Asymptomatic - no recent exposure (last 10 days) testing not required  Diagnosis: Acute metabolic encephalopathy [[id number]]  Admitting Physician: Toy Baker [[id number]]  Attending Physician: Toy Baker [[id number]]           Obs      Level of care     tele  For 12H       Saron Tweed 04/27/2022, 11:26 PM    [facility]     after 2 AM please page floor coverage PA If 7AM-7PM, please contact the day team taking care of the patient using [email or web address]   Patient was evaluated in the context of the global COVID-19 pandemic, which necessitated consideration that the patient might be at risk for infection with the SARS-CoV-2 virus that causes COVID-19. Institutional protocols and algorithms that pertain to the evaluation of patients at risk for COVID-19 are in a state of rapid change based on information released by regulatory bodies including the CDC and federal and state organizations. These policies and algorithms were followed during the patient's care.

## 2022-04-27 NOTE — Telephone Encounter (Signed)
[name] called and stated the family is reporting a mental status change. He is telling them that all his nurses are abusing him and they will not send anyone else out there. The family is caring for him, but is unable to care for him 24/7. He has been complaining of something in his eye and rubbed it for 45 minutes. He was seen in the ER yesterday and has an abrasion on the eye. He insist something is in his eye and will not keep the patch on. His daughter stated he is picking at his legs and causing sores on them. The daughter also stated that he called 2 people last night at 4 in the morning and told them to bring a gun "so he can blow his brains out". [name] advised the daughter to call the ambulance to come take him for evaluation since there is some suicide ideation. The daughter stated she is not going to take him yet because she was there with him. [name] has also made Dr. Trenton Gammon aware of his mental status change as well.

## 2022-04-27 NOTE — Assessment & Plan Note (Addendum)
Diet controlled.  Monitor blood sugar Order sent stable sliding scale

## 2022-04-27 NOTE — ED Triage Notes (Signed)
Arrived via EMS with right side eye pain.  Pain to both eyes but worse in right eye.  Woke up with eye pain.  History brain surgery and weakness on left side

## 2022-04-27 NOTE — ED Triage Notes (Signed)
Patient presents from home due to increased agitation, aggression and depression over the past few weeks. Today he called around to people asking for a gun to shoot himself and his caregiver. The caregiver quit. Family requests a psych evaluation and assistance with placement or help to find a new caregiver. Patient is A&O x 4.   HX: Brain tumor, left side paralysis   EMS vitals: 150/90 BP 97% SPO2 room air 102 HR

## 2022-04-27 NOTE — ED Provider Notes (Signed)
[facility] EMERGENCY DEPT Provider Note   CSN: [id number] Arrival date & time: 04/27/22  0867     History  Chief Complaint  Patient presents with   Eye Pain    Patrick Buchanan is a 71 y.o. male.  HPI 71 year old male presents today complaining of right eye pain and foreign body sensation.  Patient has recently had meningioma resection has subsequent left sided hemiparesis, recent Foley catheter placed due to urinary retention history of seizures, type 2 diabetes.  Patient does not remember having any definite foreign body to eye.  He woke up and there is a foreign body sensation and pain in the right eye.  He is not having any significant visual change he denies any prior eye surgery he does not wear contacts.    Home Medications Prior to Admission medications   Medication Sig Start Date End Date Taking? Authorizing Provider  baclofen (LIORESAL) 10 MG tablet Take 1 tablet (10 mg total) by mouth 3 (three) times daily. 04/24/22   Ronnell Freshwater, NP  calcium carbonate (TUMS - DOSED IN MG ELEMENTAL CALCIUM) 500 MG chewable tablet Chew 1 tablet (200 mg of elemental calcium total) by mouth 3 (three) times daily with meals. 03/30/22   Setzer, Edman Circle, PA-C  chlorpheniramine (CHLOR-TRIMETON) 4 MG tablet Take 4 mg by mouth 2 (two) times daily as needed for allergies.    [provider]  diclofenac (VOLTAREN) 50 MG EC tablet Take 1 tablet (50 mg total) by mouth 2 (two) times daily. 04/26/22   Meredith Staggers, MD  diclofenac Sodium (VOLTAREN) 1 % GEL Apply 2 g topically 3 (three) times daily. 03/30/22   Setzer, Edman Circle, PA-C  escitalopram (LEXAPRO) 20 MG tablet Take 1 tablet (20 mg total) by mouth daily. 04/24/22   Ronnell Freshwater, NP  gabapentin (NEURONTIN) 100 MG capsule Take 1 capsule (100 mg total) by mouth 2 (two) times daily. 04/24/22   Ronnell Freshwater, NP  HYDROcodone-acetaminophen (NORCO/VICODIN) 5-325 MG tablet Take 1 tablet by mouth every 4 (four) hours as  needed for moderate pain. 03/30/22   Setzer, Edman Circle, PA-C  lamoTRIgine (LAMICTAL) 100 MG tablet Take 1 tablet (100 mg total) by mouth 2 (two) times daily. 04/24/22   Ronnell Freshwater, NP  levETIRAcetam (KEPPRA) 500 MG tablet Take 1 tablet (500 mg total) by mouth 2 (two) times daily. 04/24/22   Ronnell Freshwater, NP  linaclotide (LINZESS) 145 MCG CAPS capsule Take 1 capsule (145 mcg total) by mouth daily before breakfast. 04/24/22   Ronnell Freshwater, NP  magnesium gluconate (MAGONATE) 500 MG tablet Take 0.5 tablets (250 mg total) by mouth at bedtime. 04/24/22   Ronnell Freshwater, NP  Multiple Vitamin (MULTIVITAMIN) tablet Take 1 tablet by mouth daily.    [provider]  pantoprazole (PROTONIX) 40 MG tablet Take 1 tablet (40 mg total) by mouth at bedtime. 04/24/22   Ronnell Freshwater, NP  polyvinyl alcohol (LIQUIFILM TEARS) 1.4 % ophthalmic solution Place 1 drop into both eyes as needed for dry eyes.    [provider]  rosuvastatin (CRESTOR) 20 MG tablet Take 1 tablet (20 mg total) by mouth daily. 04/24/22   Ronnell Freshwater, NP  senna-docusate (SENOKOT-S) 8.6-50 MG tablet Take 2 tablets by mouth at bedtime. 03/30/22   Setzer, Edman Circle, PA-C  traZODone (DESYREL) 50 MG tablet Take 0.5-1 tablets (25-50 mg total) by mouth at bedtime as needed for sleep. 04/24/22   Ronnell Freshwater, NP  Vitamin D, Ergocalciferol, (DRISDOL) 1.25 MG (50000 UNIT) CAPS capsule Take 1 capsule (50,000 Units total) by mouth every Sunday. Take one tablet wkly 04/30/22   Ronnell Freshwater, NP      Allergies    Other    Review of Systems   Review of Systems  Physical Exam Updated Vital Signs BP 138/82 (BP Location: Right Arm)   Pulse 95   Temp 98.4 F (36.9 C) (Oral)   Resp 18   Ht 1.829 m (6')   Wt 90.7 kg   SpO2 98%   BMI 27.12 kg/m  Physical Exam Vitals and nursing note reviewed.  Constitutional:      General: He is not in acute distress.    Appearance: He is ill-appearing.  HENT:     Head:  Normocephalic.     Right Ear: External ear normal.     Left Ear: External ear normal.     Nose: Nose normal.     Mouth/Throat:     Mouth: Mucous membranes are moist.     Pharynx: Oropharynx is clear.  Eyes:     General: Lids are normal.     Extraocular Movements: Extraocular movements intact.     Right eye: Normal extraocular motion.     Left eye: Normal extraocular motion.     Conjunctiva/sclera:     Right eye: Right conjunctiva is injected.     Left eye: Left conjunctiva is not injected.     Pupils: Pupils are equal, round, and reactive to light.     Right eye: Pupil is sluggish.     Left eye: Pupil is not sluggish.     Slit lamp exam:    Right eye: Anterior chamber quiet. No corneal flare or corneal ulcer.     Left eye: No corneal flare or corneal ulcer.     Visual Fields: Right eye visual fields normal and left eye visual fields normal.     Comments: Mild injection right outer quadrant right eye Woods lamp used as patient is unable to sit up in bed and use slit-lamp Fluorescein uptake noted in an oval pattern right lower outer quadrant of cornea to sclera Visual acuity checked with glasses and appears at baseline  Genitourinary:    Comments: Foley catheter in place Rectal exam performed no stool palpated Neurological:     Mental Status: He is alert.     Motor: Weakness and tremor present.     Comments: Patient with left upper and lower extremity hemiparesis     ED Results / Procedures / Treatments   Labs (all labs ordered are listed, but only abnormal results are displayed) Labs Reviewed - No data to display  EKG None  Radiology No results found.  Procedures Procedures    Medications Ordered in ED Medications  fluorescein ophthalmic strip 1 strip (has no administration in time range)  tetracaine (PONTOCAINE) 0.5 % ophthalmic solution 2 drop (has no administration in time range)  ciprofloxacin (CILOXAN) 0.3 % ophthalmic solution 2 drop (has no administration  in time range)    ED Course/ Medical Decision Making/ A&P                           Medical Decision Making 71 year old male recent meningioma resection with left-sided weakness presents today complaining of eye pain.  On exam patient had corneal abrasion of the right eye is being treated with Cipro drops. Patient also complained of ongoing constipation.  Patient has  a Foley catheter in place.  Rectal exam was performed to assure there is no rectal impaction.  None was palpable Patient previous note from rehab reviewed from yesterday patient appears to be at baseline. He is started on Cipro drops and advised of the need for follow-up and return precautions and voices understanding  Amount and/or Complexity of Data Reviewed External Data Reviewed: notes.    Details: Previous hospitalization and note from rehab reviewed from yesterday  Risk Prescription drug management.           Final Clinical Impression(s) / ED Diagnoses Final diagnoses:  Abrasion of right cornea, initial encounter    Rx / DC Orders ED Discharge Orders     None         Pattricia Boss, MD 04/27/22 [phone]

## 2022-04-27 NOTE — Telephone Encounter (Signed)
Patients daughter called office stating patient has been dismissed from [facility] due to aggressive behavior. She states she has no help with father at this time. Patient is paralyzed on left side and is unable to ambulate. States she is needing help ASAP. I reached out to [facility] and spoke with Geni Bers who has been notified this is an emergent situation and that patient is needing either home health or nursing home placement ASAP. Geni Bers is going to reach out to [employer] (patients daughter). AS, CMA

## 2022-04-27 NOTE — ED Provider Notes (Signed)
[facility] DEPT Provider Note   CSN: [id number] Arrival date & time: 04/27/22  1505     History  Chief Complaint  Patient presents with   Psychiatric Evaluation   Suicidal    Patrick Buchanan is a 71 y.o. male.  Presents emergency room due to concern for suicidality.  Family reports that patient has had steadily increasing agitation over the past few weeks.  According to caregiver patient had called a friend and requested to bring a gun so he could shoot himself and caregiver.  Patient states that he did not intend for the friend actually bring any weapon and he did not want to harm anybody including himself.  He has no thoughts of suicidal ideation and states he has not taken any actions to do this.  He says that he previously had firearms in the household but since receiving caregiver support he has no firearms or access to them at this time.  He denies any headaches, no change in the left-sided weakness.  Daughters report that he seems to be gaining function in his left arm.  Patient had surgery for large right meningioma in May, postresection patient had significant weakness and left arm and left leg.  Extensive chart review completed, reviewed discharge summary from 5/16.  HPI     Home Medications Prior to Admission medications   Medication Sig Start Date End Date Taking? Authorizing Provider  baclofen (LIORESAL) 10 MG tablet Take 1 tablet (10 mg total) by mouth 3 (three) times daily. 04/24/22   Ronnell Freshwater, NP  calcium carbonate (TUMS - DOSED IN MG ELEMENTAL CALCIUM) 500 MG chewable tablet Chew 1 tablet (200 mg of elemental calcium total) by mouth 3 (three) times daily with meals. 03/30/22   Setzer, Edman Circle, PA-C  chlorpheniramine (CHLOR-TRIMETON) 4 MG tablet Take 4 mg by mouth 2 (two) times daily as needed for allergies.    [provider]  diclofenac (VOLTAREN) 50 MG EC tablet Take 1 tablet (50 mg total) by mouth 2 (two) times daily.  04/26/22   Meredith Staggers, MD  diclofenac Sodium (VOLTAREN) 1 % GEL Apply 2 g topically 3 (three) times daily. 03/30/22   Setzer, Edman Circle, PA-C  escitalopram (LEXAPRO) 20 MG tablet Take 1 tablet (20 mg total) by mouth daily. 04/24/22   Ronnell Freshwater, NP  gabapentin (NEURONTIN) 100 MG capsule Take 1 capsule (100 mg total) by mouth 2 (two) times daily. 04/24/22   Ronnell Freshwater, NP  HYDROcodone-acetaminophen (NORCO/VICODIN) 5-325 MG tablet Take 1 tablet by mouth every 4 (four) hours as needed for moderate pain. 03/30/22   Setzer, Edman Circle, PA-C  lamoTRIgine (LAMICTAL) 100 MG tablet Take 1 tablet (100 mg total) by mouth 2 (two) times daily. 04/24/22   Ronnell Freshwater, NP  levETIRAcetam (KEPPRA) 500 MG tablet Take 1 tablet (500 mg total) by mouth 2 (two) times daily. 04/24/22   Ronnell Freshwater, NP  linaclotide (LINZESS) 145 MCG CAPS capsule Take 1 capsule (145 mcg total) by mouth daily before breakfast. 04/24/22   Ronnell Freshwater, NP  magnesium gluconate (MAGONATE) 500 MG tablet Take 0.5 tablets (250 mg total) by mouth at bedtime. 04/24/22   Ronnell Freshwater, NP  Multiple Vitamin (MULTIVITAMIN) tablet Take 1 tablet by mouth daily.    [provider]  pantoprazole (PROTONIX) 40 MG tablet Take 1 tablet (40 mg total) by mouth at bedtime. 04/24/22   Ronnell Freshwater, NP  polyvinyl alcohol (LIQUIFILM TEARS) 1.4 %  ophthalmic solution Place 1 drop into both eyes as needed for dry eyes.    [provider]  rosuvastatin (CRESTOR) 20 MG tablet Take 1 tablet (20 mg total) by mouth daily. 04/24/22   Ronnell Freshwater, NP  senna-docusate (SENOKOT-S) 8.6-50 MG tablet Take 2 tablets by mouth at bedtime. 03/30/22   Setzer, Edman Circle, PA-C  traZODone (DESYREL) 50 MG tablet Take 0.5-1 tablets (25-50 mg total) by mouth at bedtime as needed for sleep. 04/24/22   Ronnell Freshwater, NP  Vitamin D, Ergocalciferol, (DRISDOL) 1.25 MG (50000 UNIT) CAPS capsule Take 1 capsule (50,000 Units total) by mouth every Sunday.  Take one tablet wkly 04/30/22   Ronnell Freshwater, NP      Allergies    Other    Review of Systems   Review of Systems  Constitutional:  Negative for chills and fever.  HENT:  Negative for ear pain and sore throat.   Eyes:  Negative for pain and visual disturbance.  Respiratory:  Negative for cough and shortness of breath.   Cardiovascular:  Negative for chest pain and palpitations.  Gastrointestinal:  Negative for abdominal pain and vomiting.  Genitourinary:  Negative for dysuria and hematuria.  Musculoskeletal:  Negative for arthralgias and back pain.  Skin:  Negative for color change and rash.  Neurological:  Negative for seizures and syncope.  Psychiatric/Behavioral:  Positive for suicidal ideas.   All other systems reviewed and are negative.   Physical Exam Updated Vital Signs BP 130/79   Pulse 97   Temp 98.8 F (37.1 C) (Oral)   Resp 16   SpO2 95%  Physical Exam Vitals and nursing note reviewed.  Constitutional:      General: He is not in acute distress.    Appearance: He is well-developed.  HENT:     Head: Normocephalic and atraumatic.  Eyes:     Conjunctiva/sclera: Conjunctivae normal.  Cardiovascular:     Rate and Rhythm: Normal rate and regular rhythm.     Heart sounds: No murmur heard. Pulmonary:     Effort: Pulmonary effort is normal. No respiratory distress.     Breath sounds: Normal breath sounds.  Abdominal:     Palpations: Abdomen is soft.     Tenderness: There is no abdominal tenderness.  Musculoskeletal:        General: No swelling.     Cervical back: Neck supple.  Skin:    General: Skin is warm and dry.     Capillary Refill: Capillary refill takes less than 2 seconds.  Neurological:     Mental Status: He is alert and oriented to person, place, and time.     Comments: Baseline left-sided weakness present  Psychiatric:        Mood and Affect: Mood normal.     ED Results / Procedures / Treatments   Labs (all labs ordered are listed, but  only abnormal results are displayed) Labs Reviewed  CBC WITH DIFFERENTIAL/PLATELET - Abnormal; Notable for the following components:      Result Value   RBC 4.05 (*)    Hemoglobin 11.9 (*)    HCT 37.1 (*)    All other components within normal limits  URINALYSIS, ROUTINE W REFLEX MICROSCOPIC - Abnormal; Notable for the following components:   APPearance CLOUDY (*)    Hgb urine dipstick MODERATE (*)    Ketones, ur 80 (*)    Protein, ur 30 (*)    Leukocytes,Ua LARGE (*)    RBC / HPF >50 (*)  WBC, UA >50 (*)    Bacteria, UA FEW (*)    All other components within normal limits  URINE CULTURE  BASIC METABOLIC PANEL  AMMONIA  HEPATIC FUNCTION PANEL  MAGNESIUM  PHOSPHORUS  PREALBUMIN  SEDIMENTATION RATE  C-REACTIVE PROTEIN    EKG None  Radiology CT Head W or Wo Contrast  Result Date: 04/27/2022 CLINICAL DATA:  Delirium. Recent craniotomy for meningioma resection. EXAM: CT HEAD WITHOUT AND WITH CONTRAST TECHNIQUE: Contiguous axial images were obtained from the base of the skull through the vertex without and with intravenous contrast. RADIATION DOSE REDUCTION: This exam was performed according to the departmental dose-optimization program which includes automated exposure control, adjustment of the mA and/or kV according to patient size and/or use of iterative reconstruction technique. CONTRAST:  2m OMNIPAQUE IOHEXOL 300 MG/ML  SOLN COMPARISON:  CT head 02/28/2022 FINDINGS: Brain: Postop right convexity craniotomy for tumor resection. Large enhancing lesion in the right parietal convexity has been resected. There is irregular rim enhancement of a fluid collection at the surgical site which could represent abscess. This contains gas and appears extra-axial. Multiple additional enhancing masses compatible with meningioma. These are stable from prior studies. There is edema in the left frontal lobe and left parietal lobe due to enhancing mass lesions. Ventricle size normal. 5 mm midline  shift to the right due to mass-effect. Vascular: Aneurysm clipping in the right posterior communicating artery region. Skull: Right convexity craniotomy.  Right temporal craniotomy. Sinuses/Orbits: Paranasal sinuses clear.  Negative orbit Other: None IMPRESSION: Postop right parietal convexity craniotomy for tumor resection. There is a rim enhancing fluid collection containing gas at the surgical site, suspicious for abscess. This measures approximately 25 x 13 mm. Numerous residual enhancing meningioma are noted bilaterally. There is edema in the left frontal lobe left parietal lobe with mild midline shift to the right. These results were called by telephone at the time of interpretation on 04/27/2022 at 8:00 pm [facility] who verbally acknowledged these results. Electronically Signed   By: CFranchot GalloM.D.   On: 04/27/2022 20:00    Procedures Procedures    Medications Ordered in ED Medications  dexamethasone (DECADRON) injection 2 mg (has no administration in time range)  iohexol (OMNIPAQUE) 300 MG/ML solution 75 mL (75 mLs Intravenous Contrast Given 04/27/22 1928)    ED Course/ Medical Decision Making/ A&P                           Medical Decision Making Amount and/or Complexity of Data Reviewed Labs: ordered. Radiology: ordered.  Risk Prescription drug management. Decision regarding hospitalization.   71year old male presents for suicidal threat, increased agitation.  Notably in May patient underwent craniotomy for very large right meningioma.  Today patient is alert, left-sided weakness at baseline per patient and family.  Due to reported behavioral changes, check CT, basic labs.  No electrolyte derangement, no leukocytosis.  CT scan was independently reviewed and interpreted by myself.  I reviewed the results in detail with the radiologist.  He reported concern for possible postoperative abscess.  I discussed the case in detail with Dr. EEllene Route neurosurgery.  He  reviewed CT and states that he feels this is unlikely to be abscess and more likely expected postoperative changes.  He is concerned at the amount of vasogenic edema from the left sided lesions causing some midline shift.  He advises admission to hospitalist, initiation of steroids at low-dose for now 2 mg every  8.  He will consult on patient when patient gets to [facility].  I discussed with case with Dr. Roel Cluck who agrees to admit to [facility].  Family updated throughout stay.  Frequent reassessment throughout stay.        Final Clinical Impression(s) / ED Diagnoses Final diagnoses:  Vasogenic edema ([place])  Behavioral change  Meningioma [facility])    Rx / DC Orders ED Discharge Orders     None         Lucrezia Starch, MD 04/27/22 2148

## 2022-04-27 NOTE — Subjective & Objective (Addendum)
Patient for the past few weeks has been getting progressively more irritable Earlier today he was having some eye irritation was seen in the emergency department Also was noted at home while on the phone with his best friend he did mention but if his friend had a gun that he should bring it over and shoot him Later on patient states this was a joke But he was transferred to emergency department for suicidal ideation evaluation. Since meningioma surgery in May patient have had left dense hemiparesis which has actually gotten slightly better. He denies any fevers or chills Patient is wheelchair-bound and needs were lift to transfer He has multiple caregivers at home 24/7 Denies any suicidal ideations states that he feels that suicide is a mortal's and this was clearly a joke and he would never think of doing anything like this. States that there is no guns in the house order has been removed and he will be physically unable to reach for them given his significant left-sided weakness

## 2022-04-27 NOTE — Patient Outreach (Signed)
Received a nurse call center notification for Mr. Patrick Buchanan . I have assigned Deloria Lair, NP to call for follow up and determine if there are any Case Management needs.    [facility], [place], [place] Management [phone]

## 2022-04-27 NOTE — Patient Outreach (Signed)
[facility]) Care Management  04/27/2022  BLAIN HUNSUCKER [birth year] [phone]  Request received to assist pt's daughter with possible LTC placement. Called and left a message for her to return my call.  Eulah Pont. Myrtie Neither, MSN, [facility] Gerontological Nurse Practitioner [facility] Care Management [phone]

## 2022-04-27 NOTE — Assessment & Plan Note (Signed)
CT scan was initially worrisome for possible abscess  This was discussed with neurosurgery who thinks that there is abscess less likely.  We will see in consult on arrival to [facility]

## 2022-04-28 ENCOUNTER — Other Ambulatory Visit: Payer: Self-pay | Admitting: *Deleted

## 2022-04-28 DIAGNOSIS — F32A Depression, unspecified: Secondary | ICD-10-CM | POA: Diagnosis present

## 2022-04-28 DIAGNOSIS — Z86011 Personal history of benign neoplasm of the brain: Secondary | ICD-10-CM | POA: Diagnosis not present

## 2022-04-28 DIAGNOSIS — R4689 Other symptoms and signs involving appearance and behavior: Secondary | ICD-10-CM | POA: Diagnosis not present

## 2022-04-28 DIAGNOSIS — R4587 Impulsiveness: Secondary | ICD-10-CM | POA: Diagnosis present

## 2022-04-28 DIAGNOSIS — G8114 Spastic hemiplegia affecting left nondominant side: Secondary | ICD-10-CM | POA: Diagnosis present

## 2022-04-28 DIAGNOSIS — L89322 Pressure ulcer of left buttock, stage 2: Secondary | ICD-10-CM | POA: Diagnosis present

## 2022-04-28 DIAGNOSIS — K219 Gastro-esophageal reflux disease without esophagitis: Secondary | ICD-10-CM | POA: Diagnosis present

## 2022-04-28 DIAGNOSIS — M1712 Unilateral primary osteoarthritis, left knee: Secondary | ICD-10-CM | POA: Diagnosis not present

## 2022-04-28 DIAGNOSIS — I10 Essential (primary) hypertension: Secondary | ICD-10-CM | POA: Diagnosis present

## 2022-04-28 DIAGNOSIS — M11261 Other chondrocalcinosis, right knee: Secondary | ICD-10-CM | POA: Diagnosis present

## 2022-04-28 DIAGNOSIS — E1169 Type 2 diabetes mellitus with other specified complication: Secondary | ICD-10-CM | POA: Diagnosis not present

## 2022-04-28 DIAGNOSIS — R569 Unspecified convulsions: Secondary | ICD-10-CM | POA: Diagnosis present

## 2022-04-28 DIAGNOSIS — D329 Benign neoplasm of meninges, unspecified: Secondary | ICD-10-CM | POA: Diagnosis not present

## 2022-04-28 DIAGNOSIS — R45851 Suicidal ideations: Secondary | ICD-10-CM | POA: Diagnosis present

## 2022-04-28 DIAGNOSIS — R627 Adult failure to thrive: Secondary | ICD-10-CM | POA: Diagnosis present

## 2022-04-28 DIAGNOSIS — R338 Other retention of urine: Secondary | ICD-10-CM | POA: Diagnosis not present

## 2022-04-28 DIAGNOSIS — J309 Allergic rhinitis, unspecified: Secondary | ICD-10-CM | POA: Diagnosis present

## 2022-04-28 DIAGNOSIS — K5900 Constipation, unspecified: Secondary | ICD-10-CM | POA: Diagnosis present

## 2022-04-28 DIAGNOSIS — D649 Anemia, unspecified: Secondary | ICD-10-CM | POA: Diagnosis not present

## 2022-04-28 DIAGNOSIS — R29810 Facial weakness: Secondary | ICD-10-CM | POA: Diagnosis present

## 2022-04-28 DIAGNOSIS — D420 Neoplasm of uncertain behavior of cerebral meninges: Secondary | ICD-10-CM | POA: Diagnosis present

## 2022-04-28 DIAGNOSIS — G936 Cerebral edema: Secondary | ICD-10-CM | POA: Diagnosis present

## 2022-04-28 DIAGNOSIS — T380X5A Adverse effect of glucocorticoids and synthetic analogues, initial encounter: Secondary | ICD-10-CM | POA: Diagnosis present

## 2022-04-28 DIAGNOSIS — G9341 Metabolic encephalopathy: Secondary | ICD-10-CM | POA: Diagnosis present

## 2022-04-28 DIAGNOSIS — Z794 Long term (current) use of insulin: Secondary | ICD-10-CM | POA: Diagnosis not present

## 2022-04-28 DIAGNOSIS — R339 Retention of urine, unspecified: Secondary | ICD-10-CM | POA: Diagnosis present

## 2022-04-28 DIAGNOSIS — R258 Other abnormal involuntary movements: Secondary | ICD-10-CM | POA: Diagnosis present

## 2022-04-28 DIAGNOSIS — F419 Anxiety disorder, unspecified: Secondary | ICD-10-CM | POA: Diagnosis present

## 2022-04-28 DIAGNOSIS — K5903 Drug induced constipation: Secondary | ICD-10-CM | POA: Diagnosis not present

## 2022-04-28 DIAGNOSIS — E119 Type 2 diabetes mellitus without complications: Secondary | ICD-10-CM | POA: Diagnosis present

## 2022-04-28 DIAGNOSIS — S0501XA Injury of conjunctiva and corneal abrasion without foreign body, right eye, initial encounter: Secondary | ICD-10-CM | POA: Diagnosis present

## 2022-04-28 LAB — COMPREHENSIVE METABOLIC PANEL
ALT: 14 U/L (ref 0–44)
AST: 27 U/L (ref 15–41)
Albumin: 3.3 g/dL — ABNORMAL LOW (ref 3.5–5.0)
Alkaline Phosphatase: 79 U/L (ref 38–126)
Anion gap: 11 (ref 5–15)
BUN: 13 mg/dL (ref 8–23)
CO2: 23 mmol/L (ref 22–32)
Calcium: 9.1 mg/dL (ref 8.9–10.3)
Chloride: 105 mmol/L (ref 98–111)
Creatinine, Ser: 0.6 mg/dL — ABNORMAL LOW (ref 0.61–1.24)
GFR, Estimated: >60 mL/min (ref >60)
Glucose, Bld: 93 mg/dL (ref 70–99)
Potassium: 4.9 mmol/L (ref 3.5–5.1)
Sodium: 139 mmol/L (ref 135–145)
Total Bilirubin: 1.3 mg/dL — ABNORMAL HIGH (ref 0.3–1.2)
Total Protein: 6.5 g/dL (ref 6.5–8.1)

## 2022-04-28 LAB — CBC
HCT: 37.1 % — ABNORMAL LOW (ref 39.0–52.0)
Hemoglobin: 11.7 g/dL — ABNORMAL LOW (ref 13.0–17.0)
MCH: 29.3 pg (ref 26.0–34.0)
MCHC: 31.5 g/dL (ref 30.0–36.0)
MCV: 92.8 fL (ref 80.0–100.0)
Platelets: 366 10*3/uL (ref 150–400)
RBC: 4 MIL/uL — ABNORMAL LOW (ref 4.22–5.81)
RDW: 14.3 % (ref 11.5–15.5)
WBC: 8.4 10*3/uL (ref 4.0–10.5)
nRBC: 0 % (ref 0.0–0.2)

## 2022-04-28 LAB — HIV ANTIBODY (ROUTINE TESTING W REFLEX): HIV Screen 4th Generation wRfx: NONREACTIVE

## 2022-04-28 LAB — PREALBUMIN: Prealbumin: 18.4 mg/dL (ref 18–38)

## 2022-04-28 LAB — CBG MONITORING, ED
Glucose-Capillary: 101 mg/dL — ABNORMAL HIGH (ref 70–99)
Glucose-Capillary: 143 mg/dL — ABNORMAL HIGH (ref 70–99)

## 2022-04-28 LAB — TSH: TSH: 1.421 u[IU]/mL (ref 0.350–4.500)

## 2022-04-28 MED ORDER — CIPROFLOXACIN HCL 0.3 % OP SOLN
1.0000 [drp] | OPHTHALMIC | Status: DC
  Administered 2022-04-28 – 2022-05-13 (×70): 1 [drp] via OPHTHALMIC
  Filled 2022-04-28 (×2): qty 2.5

## 2022-04-28 MED ORDER — SODIUM CHLORIDE 0.9 % IV SOLN
INTRAVENOUS | Status: DC
Start: 2022-04-28 — End: 2022-05-04

## 2022-04-28 NOTE — Progress Notes (Signed)
PROGRESS NOTE   Patrick Buchanan  EHU:[id number] DOB: 20-Nov-1950 DOA: 04/27/2022 PCP: Lorrene Reid, PA-C  Brief Narrative:   71 y/o male known history of spinal meningioma status post resection of parasagittal tumor 02/2022 Dr. Vanice Sarah baseline he is wheelchair-bound and needs a lift to transfer and has multiple caregivers at home-24/7 Has a chronic indwelling Foley catheter since procedure DM TY 2 HTN prior left posterior tibial DVT Left shoulder subluxation Depression  Initially presented to the emergency room on 7/6 after visit with her physiatrist Dr. Tessa Lerner with right eye pain and foreign body sensation and was sent home Developed bizarre behavior as well as?  Suicidal intent asking people to bring guns he can blow his brains out --found to be encephalopathic--CT head showed rim-enhancing fluid collection containing gas at surgical site?  Abscesses 25 X 13 mm with numerous residual enhancing meningiomas with edema in the left frontal lobe and mild midline shift to the right  Dr. [place] Gammon was made aware-patient was transferred to [facility] was started on Decadron 2 mg every 8 for vasogenic edema and will continue Keppra and Lamictal   Hospital-Problem based course  Vasogenic edema in the setting of recent meningioma parasagittal tumor resection 02/2022 Personality changes could be from midline shift? We will ensure that psychiatry sees patient to ensure the intent of patient was not to actually kill himself Continue Decadron 2 mg every 8 hourly for now Defer further planning to neurosurgery who would also continue gabapentin 100 twice daily, Lamictal 100 twice daily, Keppra 500 twice daily Patient is 24/7 caregivers at home and has been wheelchair-bound-nonemergent therapy eval Left-sided paralysis secondary to prior surgery Patient is seen by physiatry and will continue to follow with them-continue baclofen 10 3 times daily Urinary retention with Foley  catheter Attempt voiding trial as able DM TY 2 Probably chest impaired glucose tolerance-continue sliding scale for now-continue gabapentin 100 twice daily Right-sided corneal abrasion Resume ciprofloxacin eyedrops 4 times daily Depression Continue trazodone 25-50 as needed sleep, Lexapro 20 daily  DVT prophylaxis: SCD Code Status: Full Family Communication: calld and updated [name] daught [phone]--states that the personality changes has had difficulty with recall and accurate recall--he has been sayin nurses are punching him, fearful for his life "he is in his own reality" Was very obsessive about things additionally with him scratching his face and ey Disposition:  Status is: Observation The patient will require care spanning > 2 midnights and should be moved to inpatient because:      Consultants:  Neurosurgery  Procedures:   Antimicrobials:     Subjective: Awake coherent no distress No chest pain no fever Can relate events but his insight into recent events varies somewhat from what his daughters tell me  Objective: Vitals:   04/28/22 0430 04/28/22 0500 04/28/22 0530 04/28/22 0600  BP: 114/80 108/74 110/72 110/76  Pulse: 77 85 76 70  Resp: '13 14 13 18  '$ Temp:      TempSrc:      SpO2: 95% 91% 93% 94%   No intake or output data in the 24 hours ending 04/28/22 0655 There were no vitals filed for this visit.  Examination:  Alert coherent no distress EOMI NCAT  He does have dense left upper extremity plegia as well as lower extremity plegia with 2/5 power Reflexes are brisk on the left side He is able to move his limbs he relatively well S1-S2 no murmur Abdomen is soft ROM is intact  Data Reviewed: personally reviewed  CBC    Component Value Date/Time   WBC 8.4 04/28/2022 0546   RBC 4.00 (L) 04/28/2022 0546   HGB 11.7 (L) 04/28/2022 0546   HGB 15.2 08/23/2021 0906   HCT 37.1 (L) 04/28/2022 0546   HCT 44.7 08/23/2021 0906   PLT 366 04/28/2022 0546    PLT 248 08/23/2021 0906   MCV 92.8 04/28/2022 0546   MCV 89 08/23/2021 0906   MCH 29.3 04/28/2022 0546   MCHC 31.5 04/28/2022 0546   RDW 14.3 04/28/2022 0546   RDW 12.4 08/23/2021 0906   LYMPHSABS 1.0 04/27/2022 1701   LYMPHSABS 1.1 08/23/2021 0906   MONOABS 0.7 04/27/2022 1701   EOSABS 0.1 04/27/2022 1701   EOSABS 0.1 08/23/2021 0906   BASOSABS 0.0 04/27/2022 1701   BASOSABS 0.0 08/23/2021 0906      Latest Ref Rng & Units 04/28/2022    5:46 AM 04/27/2022    5:01 PM 04/10/2022   11:53 AM  CMP  Glucose 70 - 99 mg/dL 93  79  101   BUN 8 - 23 mg/dL '13  12  12   '$ Creatinine 0.61 - 1.24 mg/dL 0.60  0.67  0.67   Sodium 135 - 145 mmol/L 139  141  143   Potassium 3.5 - 5.1 mmol/L 4.9  4.0  4.2   Chloride 98 - 111 mmol/L 105  106  107   CO2 22 - 32 mmol/L '23  23  29   '$ Calcium 8.9 - 10.3 mg/dL 9.1  9.1  9.3   Total Protein 6.5 - 8.1 g/dL 6.5  6.5  6.7   Total Bilirubin 0.3 - 1.2 mg/dL 1.3  0.8  0.5   Alkaline Phos 38 - 126 U/L 79  81  84   AST 15 - 41 U/L '27  14  20   '$ ALT 0 - 44 U/L '14  14  18      '$ Radiology Studies: CT Head W or Wo Contrast  Result Date: 04/27/2022 CLINICAL DATA:  Delirium. Recent craniotomy for meningioma resection. EXAM: CT HEAD WITHOUT AND WITH CONTRAST TECHNIQUE: Contiguous axial images were obtained from the base of the skull through the vertex without and with intravenous contrast. RADIATION DOSE REDUCTION: This exam was performed according to the departmental dose-optimization program which includes automated exposure control, adjustment of the mA and/or kV according to patient size and/or use of iterative reconstruction technique. CONTRAST:  60m OMNIPAQUE IOHEXOL 300 MG/ML  SOLN COMPARISON:  CT head 02/28/2022 FINDINGS: Brain: Postop right convexity craniotomy for tumor resection. Large enhancing lesion in the right parietal convexity has been resected. There is irregular rim enhancement of a fluid collection at the surgical site which could represent abscess.  This contains gas and appears extra-axial. Multiple additional enhancing masses compatible with meningioma. These are stable from prior studies. There is edema in the left frontal lobe and left parietal lobe due to enhancing mass lesions. Ventricle size normal. 5 mm midline shift to the right due to mass-effect. Vascular: Aneurysm clipping in the right posterior communicating artery region. Skull: Right convexity craniotomy.  Right temporal craniotomy. Sinuses/Orbits: Paranasal sinuses clear.  Negative orbit Other: None IMPRESSION: Postop right parietal convexity craniotomy for tumor resection. There is a rim enhancing fluid collection containing gas at the surgical site, suspicious for abscess. This measures approximately 25 x 13 mm. Numerous residual enhancing meningioma are noted bilaterally. There is edema in the left frontal lobe left parietal lobe with mild midline shift to the right. These results were called  by telephone at the time of interpretation on 04/27/2022 at 8:00 pm to provider [facility] , who verbally acknowledged these results. Electronically Signed   By: Franchot Gallo M.D.   On: 04/27/2022 20:00     Scheduled Meds:  baclofen  10 mg Oral TID   dexamethasone (DECADRON) injection  2 mg Intravenous Q8H   docusate sodium  100 mg Oral BID   escitalopram  20 mg Oral Daily   gabapentin  100 mg Oral BID   insulin aspart  0-9 Units Subcutaneous Q4H   lamoTRIgine  100 mg Oral BID   levETIRAcetam  500 mg Oral BID   pantoprazole  40 mg Oral QHS   rosuvastatin  20 mg Oral Daily   senna-docusate  2 tablet Oral QHS   tamsulosin  0.4 mg Oral QPC supper   Continuous Infusions:  sodium chloride 75 mL/hr at 04/28/22 0000     LOS: 0 days   Time spent: [place], MD [facility] To contact the attending provider between 7A-7P or the covering provider during after hours 7P-7A, please log into the web site [email or web address] and access using universal [place] password  for that web site. If you do not have the password, please call the hospital operator.  04/28/2022, 6:55 AM

## 2022-04-28 NOTE — Telephone Encounter (Signed)
Left Patrick Buchanan a voicemail informing her of the information below

## 2022-04-28 NOTE — ED Notes (Signed)
Pt reports the sensation to void. This RN will unclamp and remove foley.

## 2022-04-28 NOTE — Progress Notes (Signed)
Pt would like to see Dr. [place]on Gammon (Neuro Surgeon) in the morning please. Thanks Patrick Buchanan

## 2022-04-28 NOTE — TOC Initial Note (Addendum)
Transition of Care [facility]) - Initial/Assessment Note    Patient Details  Name: Patrick Buchanan MRN: [id number] Date of Birth: 05/06/1951  [facility]) CM/SW Contact:    Verdell Carmine, RN Phone Number: 04/28/2022, 2:24 PM  Clinical Narrative:                  Paitent with PMH of Menigioma excision a couple of months ago. Presents for  acute encehalopathy. Has caregivers and Home Health. With [place].  Marjory Lies from [place] is aware that patient is here. [facility] are holding service s and can look at him again if his behavioral issues are resolved.   Daughter is [facility] and looking at potential of Long term Placement.   TOC will follow for needs, recommendations, and transitions. Expected Discharge Plan: Long Term Nursing Home Barriers to Discharge: Continued Medical Work up   Patient Goals and CMS Choice        Expected Discharge Plan and Services Expected Discharge Plan: [place]   Discharge Planning Services: CM Consult   Living arrangements for the past 2 months: Single Family Home                           HH Arranged: OT, PT HH Agency: [place] Date [place]: 04/28/22 Time [place]: [id number] Representative spoke with at [place]: Marjory Lies- active with [place]. they are aware he is here in hospital  Prior Living Arrangements/Services Living arrangements for the past 2 months: [place] with:: Self Patient language and need for interpreter reviewed:: Yes        Need for Family Participation in Patient Care: Yes (Comment) Care giver support system in place?: Yes (comment) Current home services: Homehealth aide Criminal Activity/Legal Involvement Pertinent to Current Situation/Hospitalization: No - Comment as needed  Activities of Daily Living      Permission Sought/Granted                  Emotional Assessment         Alcohol / Substance Use: Not Applicable Psych  Involvement: No (comment)  Admission diagnosis:  Acute metabolic encephalopathy [K74.25] Vasogenic brain edema ([place]) [G93.6] Patient Active Problem List   Diagnosis Date Noted   Vasogenic brain edema ([place]) 95/63/8756   Acute metabolic encephalopathy 43/32/9518   Acute urinary retention 04/27/2022   Brain tumor ([place]) 02/27/2022   Meningioma ([place]) 02/27/2022   Spastic hemiparesis of left nondominant side ([place]) 01/06/2022   History of cerebral aneurysm 01/06/2022   Gait abnormality 01/06/2022   Mixed diabetic hyperlipidemia associated with type 2 diabetes mellitus ([place]) 11/18/2019   Diabetes mellitus ([place]) 11/18/2019   Counseling on health promotion and disease prevention 11/18/2019   History of smoking for 6-10 years 10/29/2019   Pain in right knee 08/25/2019   Vitamin D insufficiency 08/11/2019   Hypertriglyceridemia 08/11/2019   Prediabetes 08/11/2019   Serum sodium elevated 08/11/2019   dysthymia 10/28/2018   (BMI 30.0-34.9) 10/28/2018   Elevated blood pressure reading- no dx HTN 10/28/2018   Family history of diabetes mellitus (DM) 10/28/2018   PCP:  Lorrene Reid, PA-C Pharmacy:   [place], [place] [address] [place] [address] Phone: [phone] Fax: [phone]  [place], [place] [address] Phone: [phone] Fax: [phone]  [facility] [place] [address] Phone: [phone] Fax: [phone]     Social Determinants of Health (SDOH) Interventions    Readmission Risk Interventions     No data to display

## 2022-04-28 NOTE — Progress Notes (Signed)
Pt has been demanding staff to do the same thing over and over every 5-10 min. He is uncomfortable and wants things (linens mostly) changed constantly so he doesn't sweat or slide down in bed. Having issues with [facility] pulling down on his gown and they shouldn't be doing that because they didn't at [facility].

## 2022-04-28 NOTE — Consult Note (Addendum)
[facility] Face-to-Face Psychiatry Consult   Reason for Consult:  Suicidality Referring Physician:  Dr. Verlon Au Patient Identification: Patrick Buchanan MRN:  [id number] Principal Diagnosis: Acute metabolic encephalopathy Diagnosis:  Principal Problem:   Acute metabolic encephalopathy Active Problems:   Diabetes mellitus ([place])   Spastic hemiparesis of left nondominant side ([place])   Meningioma ([place])   Acute urinary retention   Vasogenic brain edema ([place])   Total Time spent with patient: 1.5 hours  Subjective:   Patrick Buchanan is a 71 y.o. male patient admitted with personality changes, bizarre behavior, suicidal ideation, right pain, and for a body sensation.  Patient is very outspoken, well versed and is able to explain his statement regarding suicide.  He reports" a friend asked me if there was anything else I could do, and I told him yes bring me a gun and blow my brains out.  This was a joke, I had no intent.  It was a joke when I said it, I did not laugh as well as my friend.  We both knew there would be no killing of 1 another, or anybody else."  He denies having any history of suicidal ideation, suicidal thoughts, suicide attempt, and or nonsuicidal self-injurious behavior.  Patient denies any additional risk factors, that would place him at high risk for suicide completion.  He states one of his protective factors is seeking help in the emergency room " this is why I am here, my daughters asked me to come.  If I go home and they asked me to come back because they are concerned about me I will return.  I care about my family and value their opinion.  Only my daughters and my wife who is now deceased, can convince me to go into the hospital.  At present he further denies any suicidal ideation, is able to contract for safety.  Patient is a 71 year old male with past medical history including spinal meningioma with neurosurgical interventions.  Patient also had brain tumor resection about 6  weeks ago.  Patient was seen and assessed by the psychiatry team, following consult for suicidality.  Because he denies any history of previous psychiatric diagnosis, to include history of suicidal ideation, suicide attempt, and inpatient psychiatric hospitalization, outpatient psychiatric services.  Patient further denies history of illicit substance use.  The patient is awake and alert, sitting in his wheelchair stretcher bed.  The patient denies any personality changes, at the request of his daughters he did decide to come and be evaluated as they were concerned.  Patient had good cognition, with no lapses of attention, memory recall.  Patient does have circumstantial thought processes.  His only physical complaint during this interview, residual motor deficits from previous surgery in 2005.  There does appear to be visible involuntary and spastic movements of left upper extremity, in which patient is able to control and manage.  Despite his physical limitations, patient is appropriate, linear conversation, engages well.  Will psychiatrically clear at this time.  In addition to psychiatric evaluation for suicidality.  Did complete capacity evaluation.  At the time of this evaluation patient does not appear to have capacity to make medical decisions at this time.  Patient understands risk, benefits, appreciation; and has a great support system in place.  He plans to discharge home with home health services, and shows no interest in skilled nursing facility for additional rehabilitation if warranted.   Collateral obtained from his daughter Donella Stade and [name], were slight personality changes noticed during  admission at inpatient rehabilitation.  Once patient was discharged home, symptoms of personality changes became more prominent.  They did become concerned after their father expressed suicidal statements towards a friend.  Although they have denied having any additional concerns, they would like for  psychiatry to see and assess the patient for safety concerns.  Personality changes that they have noticed include impulsivity, easily agitated, changes in executive dysfunction, and temperament.  HPI:  71 y/o male known history of spinal meningioma status post resection of parasagittal tumor 02/2022 Dr. Vanice Sarah baseline he is wheelchair-bound and needs a lift to transfer and has multiple caregivers at home-24/7 Has a chronic indwelling Foley catheter since procedure  Past Psychiatric History: Denies  Risk to Self:  Denies Risk to Others:  Denies Prior Inpatient Therapy:  Denies Prior Outpatient Therapy:  Denies  Past Medical History:  Past Medical History:  Diagnosis Date   Allergy    hay fever   Blood transfusion without reported diagnosis    Cerebral aneurysm 1992   "leaking"; treated by Dr Sherwood Gambler   Depression    Diabetes mellitus without complication ([place])    type 2   GERD (gastroesophageal reflux disease)    past hx     Past Surgical History:  Procedure Laterality Date   APPENDECTOMY     CEREBRAL ANEURYSM REPAIR  1992   clipping by Dr Sherwood Gambler; cannot have an MRI   CRANIOTOMY Bilateral 02/27/2022   Procedure: Craniotomy - bilateral - Parietal;  Surgeon: Earnie Larsson, MD;  Location: [place];  Service: Neurosurgery;  Laterality: Bilateral;   HERNIA REPAIR N/A 1990   inguinal   INGUINAL HERNIA REPAIR     as a child and then another at 74 yrs old   TONSILLECTOMY     TUMOR EXCISION  2005   benign cervical   Family History:  Family History  Problem Relation Age of Onset   Breast cancer Mother    Diabetes Brother    Colon cancer Neg Hx    Colon polyps Neg Hx    Esophageal cancer Neg Hx    Rectal cancer Neg Hx    Stomach cancer Neg Hx    Family Psychiatric  History: Denies Social History:  Social History   Substance and Sexual Activity  Alcohol Use Not Currently     Social History   Substance and Sexual Activity  Drug Use Not Currently    Social History    Socioeconomic History   Marital status: Widowed    Spouse name: Not on file   Number of children: 4   Years of education: Not on file   Highest education level: Not on file  Occupational History   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  Tobacco Use   Smoking status: Former    Packs/day: 1.00    Years: 10.00    Total pack years: 10.00    Types: Cigarettes    Quit date: 2005    Years since quitting: 18.5   Smokeless tobacco: Never  Vaping Use   Vaping Use: Some days  Substance and Sexual Activity   Alcohol use: Not Currently   Drug use: Not Currently   Sexual activity: Not Currently  Other Topics Concern   Not on file  Social History Narrative   Right handed   Widowed recently   Social Determinants of Health   Financial Resource Strain: Not on file  Food Insecurity: Not on file  Transportation Needs: Not on file  Physical Activity: Not on file  Stress: Not  on file  Social Connections: Not on file   Additional Social History:    Allergies:   Allergies  Allergen Reactions   Other Itching and Other (See Comments)    Hay fever- Itchy eyes, runny nose, congestion- no breathing issues, however    Labs:  Results for orders placed or performed during the hospital encounter of 04/27/22 (from the past 48 hour(s))  CBC with Differential     Status: Abnormal   Collection Time: 04/27/22  5:01 PM  Result Value Ref Range   WBC 8.2 4.0 - 10.5 K/uL   RBC 4.05 (L) 4.22 - 5.81 MIL/uL   Hemoglobin 11.9 (L) 13.0 - 17.0 g/dL   HCT 37.1 (L) 39.0 - 52.0 %   MCV 91.6 80.0 - 100.0 fL   MCH 29.4 26.0 - 34.0 pg   MCHC 32.1 30.0 - 36.0 g/dL   RDW 14.2 11.5 - 15.5 %   Platelets 345 150 - 400 K/uL   nRBC 0.0 0.0 - 0.2 %   Neutrophils Relative % 78 %   Neutro Abs 6.4 1.7 - 7.7 K/uL   Lymphocytes Relative 12 %   Lymphs Abs 1.0 0.7 - 4.0 K/uL   Monocytes Relative 9 %   Monocytes Absolute 0.7 0.1 - 1.0 K/uL   Eosinophils Relative 1 %   Eosinophils Absolute 0.1 0.0 - 0.5 K/uL   Basophils  Relative 0 %   Basophils Absolute 0.0 0.0 - 0.1 K/uL   Immature Granulocytes 0 %   Abs Immature Granulocytes 0.03 0.00 - 0.07 K/uL    Comment: Performed at [facility], [place] [address] [place], [place] [address]  Basic metabolic panel     Status: None   Collection Time: 04/27/22  5:01 PM  Result Value Ref Range   Sodium 141 135 - 145 mmol/L   Potassium 4.0 3.5 - 5.1 mmol/L   Chloride 106 98 - 111 mmol/L   CO2 23 22 - 32 mmol/L   Glucose, Bld 79 70 - 99 mg/dL    Comment: Glucose reference range applies only to samples taken after fasting for at least 8 hours.   BUN 12 8 - 23 mg/dL   Creatinine, Ser 0.67 0.61 - 1.24 mg/dL   Calcium 9.1 8.9 - 10.3 mg/dL   GFR, Estimated >60 >60 mL/min    Comment: (NOTE) Calculated using the CKD-EPI Creatinine Equation (2021)    Anion gap 12 5 - 15    Comment: Performed at [facility], [place] [address], [place], [place] [address]  Hepatic function panel     Status: Abnormal   Collection Time: 04/27/22  5:01 PM  Result Value Ref Range   Total Protein 6.5 6.5 - 8.1 g/dL   Albumin 3.5 3.5 - 5.0 g/dL   AST 14 (L) 15 - 41 U/L   ALT 14 0 - 44 U/L   Alkaline Phosphatase 81 38 - 126 U/L   Total Bilirubin 0.8 0.3 - 1.2 mg/dL   Bilirubin, Direct <0.1 0.0 - 0.2 mg/dL    Comment: REPEATED TO VERIFY   Indirect Bilirubin NOT CALCULATED 0.3 - 0.9 mg/dL    Comment: Performed at [facility], [place] [address], [place], [place] [address]  Magnesium     Status: None   Collection Time: 04/27/22  5:01 PM  Result Value Ref Range   Magnesium 1.9 1.7 - 2.4 mg/dL    Comment: Performed at [facility], [place] [address] [place], [place] [address]  Phosphorus     Status: None   Collection Time: 04/27/22  5:01 PM  Result Value Ref Range   Phosphorus 3.7 2.5 - 4.6 mg/dL    Comment: Performed at [facility], [place] [address] [place], [place] [address]  Sedimentation rate      Status: Abnormal   Collection Time: 04/27/22  5:01 PM  Result Value Ref Range   Sed Rate 26 (H) 0 - 16 mm/hr    Comment: Performed at [facility], [place] [address] [place], [place] [address]  Urinalysis, Routine w reflex microscopic     Status: Abnormal   Collection Time: 04/27/22  6:16 PM  Result Value Ref Range   Color, Urine YELLOW YELLOW   APPearance CLOUDY (A) CLEAR   Specific Gravity, Urine 1.018 1.005 - 1.030   pH 5.0 5.0 - 8.0   Glucose, UA NEGATIVE NEGATIVE mg/dL   Hgb urine dipstick MODERATE (A) NEGATIVE   Bilirubin Urine NEGATIVE NEGATIVE   Ketones, ur 80 (A) NEGATIVE mg/dL   Protein, ur 30 (A) NEGATIVE mg/dL   Nitrite NEGATIVE NEGATIVE   Leukocytes,Ua LARGE (A) NEGATIVE   RBC / HPF >50 (H) 0 - 5 RBC/hpf   WBC, UA >50 (H) 0 - 5 WBC/hpf   Bacteria, UA FEW (A) NONE SEEN   WBC Clumps PRESENT    Mucus PRESENT     Comment: Performed at [facility], [place] [address] [place], [place] [address]  Ammonia     Status: None   Collection Time: 04/27/22  9:50 PM  Result Value Ref Range   Ammonia 19 9 - 35 umol/L    Comment: Performed at [facility], [place] [address], [place], [place] [address]  C-reactive protein     Status: None   Collection Time: 04/27/22  9:50 PM  Result Value Ref Range   CRP 0.6 <1.0 mg/dL    Comment: Performed at [facility], [place] [address] [place], [place] [address]  Prealbumin     Status: None   Collection Time: 04/28/22  5:46 AM  Result Value Ref Range   Prealbumin 18.4 18 - 38 mg/dL    Comment: Performed at [place] [address] [place], [place] [address]  HIV Antibody (routine testing w rflx)     Status: None   Collection Time: 04/28/22  5:46 AM  Result Value Ref Range   HIV Screen 4th Generation wRfx Non Reactive Non Reactive    Comment: Performed at [facility], [place] [address] [place], [place] [address]  Comprehensive metabolic panel     Status: Abnormal    Collection Time: 04/28/22  5:46 AM  Result Value Ref Range   Sodium 139 135 - 145 mmol/L   Potassium 4.9 3.5 - 5.1 mmol/L    Comment: MODERATE HEMOLYSIS   Chloride 105 98 - 111 mmol/L   CO2 23 22 - 32 mmol/L   Glucose, Bld 93 70 - 99 mg/dL    Comment: Glucose reference range applies only to samples taken after fasting for at least 8 hours.   BUN 13 8 - 23 mg/dL   Creatinine, Ser 0.60 (L) 0.61 - 1.24 mg/dL   Calcium 9.1 8.9 - 10.3 mg/dL   Total Protein 6.5 6.5 - 8.1 g/dL   Albumin 3.3 (L) 3.5 - 5.0 g/dL   AST 27 15 - 41 U/L   ALT 14 0 - 44 U/L   Alkaline Phosphatase 79 38 -  126 U/L   Total Bilirubin 1.3 (H) 0.3 - 1.2 mg/dL   GFR, Estimated >60 >60 mL/min    Comment: (NOTE) Calculated using the CKD-EPI Creatinine Equation (2021)    Anion gap 11 5 - 15    Comment: Performed at [facility], [place] [address], [place], [place] [address]  CBC     Status: Abnormal   Collection Time: 04/28/22  5:46 AM  Result Value Ref Range   WBC 8.4 4.0 - 10.5 K/uL   RBC 4.00 (L) 4.22 - 5.81 MIL/uL   Hemoglobin 11.7 (L) 13.0 - 17.0 g/dL   HCT 37.1 (L) 39.0 - 52.0 %   MCV 92.8 80.0 - 100.0 fL   MCH 29.3 26.0 - 34.0 pg   MCHC 31.5 30.0 - 36.0 g/dL   RDW 14.3 11.5 - 15.5 %   Platelets 366 150 - 400 K/uL   nRBC 0.0 0.0 - 0.2 %    Comment: Performed at [facility], [place] [address], [place], [place] [address]  TSH     Status: None   Collection Time: 04/28/22  5:46 AM  Result Value Ref Range   TSH 1.421 0.350 - 4.500 uIU/mL    Comment: Performed by a 3rd Generation assay with a functional sensitivity of <=0.01 uIU/mL. Performed at [facility], [place] [address] [place], [place] [address]   CBG monitoring, ED     Status: Abnormal   Collection Time: 04/28/22  6:00 AM  Result Value Ref Range   Glucose-Capillary 101 (H) 70 - 99 mg/dL    Comment: Glucose reference range applies only to samples taken after fasting for at least 8 hours.  CBG  monitoring, ED     Status: Abnormal   Collection Time: 04/28/22  1:23 PM  Result Value Ref Range   Glucose-Capillary 143 (H) 70 - 99 mg/dL    Comment: Glucose reference range applies only to samples taken after fasting for at least 8 hours.    Current Facility-Administered Medications  Medication Dose Route Frequency Provider Last Rate Last Admin   0.9 %  sodium chloride infusion   Intravenous Continuous Nita Sells, MD 75 mL/hr at 04/28/22 0939 New Bag at 04/28/22 0939   acetaminophen (TYLENOL) tablet 650 mg  650 mg Oral Q6H PRN Toy Baker, MD       Or   acetaminophen (TYLENOL) suppository 650 mg  650 mg Rectal Q6H PRN Doutova, Anastassia, MD       baclofen (LIORESAL) tablet 10 mg  10 mg Oral TID Toy Baker, MD   10 mg at 04/28/22 6237   bisacodyl (DULCOLAX) suppository 10 mg  10 mg Rectal Daily PRN Doutova, Nyoka Lint, MD       ciprofloxacin (CILOXAN) 0.3 % ophthalmic solution 1 drop  1 drop Right Eye Q4H while awake Nita Sells, MD   1 drop at 04/28/22 1332   dexamethasone (DECADRON) injection 2 mg  2 mg Intravenous Q8H Doutova, Anastassia, MD   2 mg at 04/28/22 1333   docusate sodium (COLACE) capsule 100 mg  100 mg Oral BID Doutova, Anastassia, MD   100 mg at 04/28/22 0922   escitalopram (LEXAPRO) tablet 20 mg  20 mg Oral Daily Doutova, Anastassia, MD   20 mg at 04/28/22 0923   gabapentin (NEURONTIN) capsule 100 mg  100 mg Oral BID Toy Baker, MD   100 mg at 04/28/22 0923   HYDROcodone-acetaminophen (NORCO/VICODIN) 5-325 MG per tablet 1 tablet  1 tablet Oral Q4H PRN  Toy Baker, MD   1 tablet at 04/28/22 0001   insulin aspart (novoLOG) injection 0-9 Units  0-9 Units Subcutaneous Q4H Doutova, Anastassia, MD       lamoTRIgine (LAMICTAL) tablet 100 mg  100 mg Oral BID Toy Baker, MD   100 mg at 04/28/22 0923   levETIRAcetam (KEPPRA) tablet 500 mg  500 mg Oral BID Toy Baker, MD   500 mg at 04/28/22 3546   pantoprazole  (PROTONIX) EC tablet 40 mg  40 mg Oral QHS Doutova, Anastassia, MD   40 mg at 04/28/22 0001   polyvinyl alcohol (LIQUIFILM TEARS) 1.4 % ophthalmic solution 1 drop  1 drop Both Eyes PRN Doutova, Anastassia, MD   1 drop at 04/28/22 1049   rosuvastatin (CRESTOR) tablet 20 mg  20 mg Oral Daily Doutova, Anastassia, MD   20 mg at 04/28/22 5681   senna-docusate (Senokot-S) tablet 2 tablet  2 tablet Oral QHS Toy Baker, MD   2 tablet at 04/28/22 0001   tamsulosin (FLOMAX) capsule 0.4 mg  0.4 mg Oral QPC supper Toy Baker, MD       traZODone (DESYREL) tablet 25-50 mg  25-50 mg Oral QHS PRN Toy Baker, MD   50 mg at 04/28/22 0001   Current Outpatient Medications  Medication Sig Dispense Refill   baclofen (LIORESAL) 10 MG tablet Take 1 tablet (10 mg total) by mouth 3 (three) times daily. 90 each 1   calcium carbonate (TUMS - DOSED IN MG ELEMENTAL CALCIUM) 500 MG chewable tablet Chew 1 tablet (200 mg of elemental calcium total) by mouth 3 (three) times daily with meals.     chlorpheniramine (CHLOR-TRIMETON) 4 MG tablet Take 4 mg by mouth 2 (two) times daily as needed for allergies.     diclofenac (VOLTAREN) 50 MG EC tablet Take 1 tablet (50 mg total) by mouth 2 (two) times daily. 60 tablet 3   diclofenac Sodium (VOLTAREN) 1 % GEL Apply 2 g topically 3 (three) times daily. 50 g 0   escitalopram (LEXAPRO) 20 MG tablet Take 1 tablet (20 mg total) by mouth daily. 30 tablet 2   gabapentin (NEURONTIN) 100 MG capsule Take 1 capsule (100 mg total) by mouth 2 (two) times daily. 60 capsule 2   HYDROcodone-acetaminophen (NORCO/VICODIN) 5-325 MG tablet Take 1 tablet by mouth every 4 (four) hours as needed for moderate pain. 20 tablet 0   lamoTRIgine (LAMICTAL) 100 MG tablet Take 1 tablet (100 mg total) by mouth 2 (two) times daily. 60 tablet 0   levETIRAcetam (KEPPRA) 500 MG tablet Take 1 tablet (500 mg total) by mouth 2 (two) times daily. 60 tablet 2   linaclotide (LINZESS) 145 MCG CAPS  capsule Take 1 capsule (145 mcg total) by mouth daily before breakfast. 30 capsule 2   magnesium gluconate (MAGONATE) 500 MG tablet Take 0.5 tablets (250 mg total) by mouth at bedtime. 30 tablet 2   Multiple Vitamin (MULTIVITAMIN) tablet Take 1 tablet by mouth daily.     pantoprazole (PROTONIX) 40 MG tablet Take 1 tablet (40 mg total) by mouth at bedtime. 30 tablet 2   polyvinyl alcohol (LIQUIFILM TEARS) 1.4 % ophthalmic solution Place 1 drop into both eyes as needed for dry eyes.     rosuvastatin (CRESTOR) 20 MG tablet Take 1 tablet (20 mg total) by mouth daily. 90 tablet 3   senna-docusate (SENOKOT-S) 8.6-50 MG tablet Take 2 tablets by mouth at bedtime.     traZODone (DESYREL) 50 MG tablet Take 0.5-1 tablets (25-50 mg  total) by mouth at bedtime as needed for sleep. 30 tablet 2   [START ON 04/30/2022] Vitamin D, Ergocalciferol, (DRISDOL) 1.25 MG (50000 UNIT) CAPS capsule Take 1 capsule (50,000 Units total) by mouth every Sunday. Take one tablet wkly 4 capsule 2    Musculoskeletal: Strength & Muscle Tone: within normal limits Gait & Station: normal Patient leans: N/A            Psychiatric Specialty Exam:  Presentation  General Appearance: Appropriate for Environment; Casual  Eye Contact:Fair  Speech:Clear and Coherent; Normal Rate  Speech Volume:Normal  Handedness:Right   Mood and Affect  Mood:Euthymic  Affect:Appropriate; Congruent   Thought Process  Thought Processes:Coherent; Goal Directed; Linear  Descriptions of Associations:Intact  Orientation:Full (Time, Place and Person)  Thought Content:WDL  History of Schizophrenia/Schizoaffective disorder:No data recorded Duration of Psychotic Symptoms:No data recorded Hallucinations:Hallucinations: None  Ideas of Reference:None  Suicidal Thoughts:Suicidal Thoughts: No  Homicidal Thoughts:Homicidal Thoughts: No   Sensorium  Memory:Immediate Good; Recent Good; Remote  Good  Judgment:Good  Insight:Good   Executive Functions  Concentration:Good  Attention Span:Good  [place] of Knowledge:Good  Language:Good   Psychomotor Activity  Psychomotor Activity:Psychomotor Activity: Normal   Assets  Assets:Communication Skills; Leisure Time; Physical Health; Desire for Improvement; Resilience; Social Support; Talents/Skills; Financial Resources/Insurance   Sleep  Sleep:Sleep: Good   Physical Exam: Physical Exam ROS Blood pressure 115/73, pulse 96, temperature 98.6 F (37 C), temperature source Oral, resp. rate (!) 25, SpO2 97 %. There is no height or weight on file to calculate BMI.  Treatment Plan Summary: Plan Psych cleared at this time.  -Consider neurocog remidiation/rehab 2-3 weeks following complete resolution of edema and abscess; if psychiatric symptoms persist.   -Discussed safety plan with patient's daughter [name] who verbalized understanding.  Reviewed methods to reduce the risk of self-injury or suicide attempts: Frequent conversations regarding unsafe thoughts. Remove all significant sharps. Remove all firearms. Remove all medications, including over-the-counter meds. Room checks for sharps or other harmful objects.   -Patient does not appear to be an imminent danger to self and or others.  Does not meet criteria for involuntary commitment at this time.   -Patient DOES appear to have capacity to make medical decisions at this time.  Capacity does fluctuate from day to day, please reconsult if additional capacity evaluations are warranted.   -Discontinue 1:1 Air cabin crew  -Psychiatry to sign off. Disposition: No evidence of imminent risk to self or others at present.   Patient does not meet criteria for psychiatric inpatient admission. Discussed crisis plan, support from social network, calling 911, coming to the Emergency Department, and calling Suicide Hotline.  Suella Broad, FNP 04/28/2022 2:27 PM

## 2022-04-28 NOTE — ED Triage Notes (Signed)
Pt has voided 100 cc

## 2022-04-28 NOTE — Patient Outreach (Signed)
[facility]) Care Management  04/28/2022  Patrick Buchanan 09-26-1951 [id number]  Second telephone outreach for referral for in home care or LTC placement. Talked to daughter, [name], who reports she has been in contact with another care management agency that is trying to help her. Unfortunately her father has had to go to the ED yesterday and is waiting to be transferred to [facility] from [place] to the neuro unit.   Daughter confirms desire for LTC placement. We discussed facilities in [facility] that would be convenient to their home. Advised that will message [facility] Liaison that pt will need assistance for LTC placement.  Sent daughter, NP contact information if needed.  Eulah Pont. Myrtie Neither, MSN, [facility] Gerontological Nurse Practitioner [facility] Care Management [phone]

## 2022-04-28 NOTE — Progress Notes (Signed)
OT Cancellation Note  Patient Details Name: DEMARUS LATTERELL MRN: [id number] DOB: [birth year]   Cancelled Treatment:    Reason Eval/Treat Not Completed: Patient declined, no reason specified.  Patient on phone with his minister per CNA/ sitter. Patient requests to continue  conversation. Will f/u as able. Plan is for transfer to [facility] for neurosurgery consult.   Demarr Kluever L Anatole Apollo 04/28/2022, 10:15 AM

## 2022-04-28 NOTE — Evaluation (Signed)
PT Cancellation Note  Patient Details Name: SUMIT BRANHAM MRN: [id number] DOB: 12/11/1950   Cancelled Treatment:    Reason Eval/Treat Not Completed: Patient declined, Patient on phone with his minister per CNA/ sitter. Patient requests to continue  conversation. Patient to transfer to [facility].  [place] Office [phone] Weekend pager-[phone]    Claretha Cooper 04/28/2022, 8:56 AM

## 2022-04-28 NOTE — ED Notes (Addendum)
Urinary foley catheter clamped per order. Pt made aware to his sitter or this RN know if he has the sensation to void.    Cream and sacrum pad applied to buttock. Pt has skin breakdown near sacrum.

## 2022-04-28 NOTE — Progress Notes (Signed)
Patient just under 2 months status post bilateral parietal craniotomy and resection of a very large parasagittal meningioma.  Patient with residual significant left-sided weakness following surgery with some relative sparing of his distal left upper extremity and left face.  He has had behavioral issues both preoperatively and postoperatively.  Recent CT scan with and without contrast demonstrates expected postoperative change with some cortical enhancement consistent with scarring.  I do not see any evidence of abscess or likely infection.  The patient has multiple other meningiomas including a large left sphenoid wing meningioma and left posterior temporal meningioma.  Currently I would not recommend surgical intervention for either of these lesions.  I think it is possible that some of his behavioral issues stem from his left-sided tumors however.  I would recommend getting Neurology involved and possibly psychiatry.

## 2022-04-29 ENCOUNTER — Inpatient Hospital Stay (HOSPITAL_COMMUNITY): Payer: Medicare HMO

## 2022-04-29 DIAGNOSIS — R4689 Other symptoms and signs involving appearance and behavior: Secondary | ICD-10-CM | POA: Diagnosis not present

## 2022-04-29 DIAGNOSIS — G9341 Metabolic encephalopathy: Secondary | ICD-10-CM

## 2022-04-29 DIAGNOSIS — D329 Benign neoplasm of meninges, unspecified: Secondary | ICD-10-CM | POA: Diagnosis not present

## 2022-04-29 LAB — CBC WITH DIFFERENTIAL/PLATELET
Abs Immature Granulocytes: 0.05 10*3/uL (ref 0.00–0.07)
Basophils Absolute: 0 10*3/uL (ref 0.0–0.1)
Basophils Relative: 0 %
Eosinophils Absolute: 0 10*3/uL (ref 0.0–0.5)
Eosinophils Relative: 0 %
HCT: 35.4 % — ABNORMAL LOW (ref 39.0–52.0)
Hemoglobin: 11.5 g/dL — ABNORMAL LOW (ref 13.0–17.0)
Immature Granulocytes: 1 %
Lymphocytes Relative: 6 %
Lymphs Abs: 0.4 10*3/uL — ABNORMAL LOW (ref 0.7–4.0)
MCH: 29.3 pg (ref 26.0–34.0)
MCHC: 32.5 g/dL (ref 30.0–36.0)
MCV: 90.1 fL (ref 80.0–100.0)
Monocytes Absolute: 0.3 10*3/uL (ref 0.1–1.0)
Monocytes Relative: 4 %
Neutro Abs: 6.2 10*3/uL (ref 1.7–7.7)
Neutrophils Relative %: 89 %
Platelets: 348 10*3/uL (ref 150–400)
RBC: 3.93 MIL/uL — ABNORMAL LOW (ref 4.22–5.81)
RDW: 14 % (ref 11.5–15.5)
WBC: 6.9 10*3/uL (ref 4.0–10.5)
nRBC: 0 % (ref 0.0–0.2)

## 2022-04-29 LAB — COMPREHENSIVE METABOLIC PANEL
ALT: 14 U/L (ref 0–44)
AST: 13 U/L — ABNORMAL LOW (ref 15–41)
Albumin: 3.2 g/dL — ABNORMAL LOW (ref 3.5–5.0)
Alkaline Phosphatase: 79 U/L (ref 38–126)
Anion gap: 10 (ref 5–15)
BUN: 15 mg/dL (ref 8–23)
CO2: 23 mmol/L (ref 22–32)
Calcium: 9.3 mg/dL (ref 8.9–10.3)
Chloride: 108 mmol/L (ref 98–111)
Creatinine, Ser: 0.7 mg/dL (ref 0.61–1.24)
GFR, Estimated: >60 mL/min (ref >60)
Glucose, Bld: 120 mg/dL — ABNORMAL HIGH (ref 70–99)
Potassium: 4.1 mmol/L (ref 3.5–5.1)
Sodium: 141 mmol/L (ref 135–145)
Total Bilirubin: 0.6 mg/dL (ref 0.3–1.2)
Total Protein: 6 g/dL — ABNORMAL LOW (ref 6.5–8.1)

## 2022-04-29 LAB — GLUCOSE, CAPILLARY
Glucose-Capillary: 130 mg/dL — ABNORMAL HIGH (ref 70–99)
Glucose-Capillary: 106 mg/dL — ABNORMAL HIGH (ref 70–99)

## 2022-04-29 MED ORDER — MAGNESIUM HYDROXIDE 400 MG/5ML PO SUSP
15.0000 mL | Freq: Every day | ORAL | Status: DC | PRN
  Administered 2022-04-29 – 2022-05-02 (×2): 15 mL via ORAL
  Filled 2022-04-29 (×3): qty 30

## 2022-04-29 MED ORDER — SODIUM CHLORIDE 0.9 % IV SOLN
100.0000 mg | Freq: Two times a day (BID) | INTRAVENOUS | Status: DC
  Administered 2022-04-30 – 2022-05-04 (×10): 100 mg via INTRAVENOUS
  Filled 2022-04-29 (×11): qty 10

## 2022-04-29 MED ORDER — QUETIAPINE FUMARATE 25 MG PO TABS
25.0000 mg | ORAL_TABLET | Freq: Every day | ORAL | Status: DC
  Administered 2022-04-29 – 2022-05-03 (×5): 25 mg via ORAL
  Filled 2022-04-29 (×5): qty 1

## 2022-04-29 NOTE — Progress Notes (Signed)
[facility] PROGRESS NOTE   Patrick Buchanan:[id number] DOB: 08-08-1951 DOA: 04/27/2022  PCP: Lorrene Reid, PA-C  Brief History/Interval Summary: 71 y/o male known history of spinal meningioma status post resection of parasagittal tumor 02/2022 Dr. Annette Stable. At baseline he is wheelchair-bound and needs a lift to transfer and has multiple caregivers at home-24/7. Initially presented to the emergency room on 7/6 after visit with his physiatrist Dr. Tessa Lerner with right eye pain and foreign body sensation and was sent home. Developed bizarre behavior as well as ?Suicidal intent asking people to bring guns he can blow his brains out --found to be encephalopathic--CT head showed rim-enhancing fluid collection containing gas at surgical site?  Abscesses 25 X 13 mm with numerous residual enhancing meningiomas with edema in the left frontal lobe and mild midline shift to the right.  Patient was started on steroids.  Was hospitalized for further management.  Transferred over to [facility].  Consultants: Neurosurgery.  Neurology.  Psychiatry  Procedures: EEG has been ordered today    Subjective/Interval History: Patient mentions that he feels well this morning.  Denies any headaches.  No visual disturbances.  Unable to move his left arm.  He is able to move his left foot.  Denies any nausea vomiting or abdominal pain.    Assessment/Plan:  Acute metabolic encephalopathy Noted to have some personality changes.  Probably from his multiple meningiomas.  He was seen by psychiatry and they did not have any recommendations at this time.  They do not feel that the patient is a danger to self.  Suicide precautions have been discontinued. He does have left hemiparesis which is somewhat chronic for him in the setting of the meningiomas. No infectious etiology has been found.  UA noted to be cloudy.  However he is afebrile.  WBC is normal.  Apparently had a indwelling Foley catheter as well  which it appears that was removed recently.  Continue to monitor off of antibiotics at this time. EEG has been ordered.  Neurosurgery recommends neurology input which will be obtained. TSH is normal.  HIV nonreactive.  Sed rate was unremarkable.  Ammonia level was normal.  Meningioma status post surgery Recent surgery was performed by Dr. Annette Stable.  Some vasogenic edema is present on imaging studies.  There was initially some concern for abscess but neurosurgery does not feel that this is the case.  He has been started on dexamethasone.  Continue to monitor.  Appreciate neurosurgery input.  He is currently on Keppra and Lamictal.  Left-sided hemiparesis This is secondary to his brain lesions.  Has around-the-clock care at home.  Urinary retention with Foley catheter Looks like Foley catheter was discontinued recently.  Monitor urine output.  Watch for retention.  Continue Flomax.  Diabetes mellitus type 2, without complications Monitor CBGs.  Right-sided corneal abrasion Continue eyedrops.  History of depression Continue home medication  Normocytic anemia Hemoglobin is stable.  No evidence of overt blood loss.   DVT Prophylaxis: SCDs Code Status: Full code Family Communication: Discussed with patient.  No family at bedside.  We will try to call his daughter later today Disposition Plan: Hopefully return home when work-up has been completed and if there is improvement in symptoms  Status is: Inpatient Remains inpatient appropriate because: Acute metabolic encephalopathy in the setting of brain tumor    Medications: Scheduled:  baclofen  10 mg Oral TID   ciprofloxacin  1 drop Right Eye Q4H while awake   dexamethasone (DECADRON) injection  2  mg Intravenous Q8H   docusate sodium  100 mg Oral BID   escitalopram  20 mg Oral Daily   gabapentin  100 mg Oral BID   insulin aspart  0-9 Units Subcutaneous Q4H   lamoTRIgine  100 mg Oral BID   levETIRAcetam  500 mg Oral BID   pantoprazole   40 mg Oral QHS   rosuvastatin  20 mg Oral Daily   senna-docusate  2 tablet Oral QHS   tamsulosin  0.4 mg Oral QPC supper   Continuous:  sodium chloride 75 mL/hr at 04/28/22 2143   KNL:[id number] **OR** acetaminophen, bisacodyl, HYDROcodone-acetaminophen, magnesium hydroxide, polyvinyl alcohol, traZODone  Antibiotics: Anti-infectives (From admission, onward)    None       Objective:  Vital Signs  Vitals:   04/28/22 1923 04/28/22 2332 04/29/22 0515 04/29/22 0805  BP: (!) 142/88 127/81 133/89 140/83  Pulse: 92 76 78 88  Resp: '20 20 13 20  '$ Temp: 98.3 F (36.8 C) 98.1 F (36.7 C) 97.9 F (36.6 C)   TempSrc: Oral Oral Oral Oral  SpO2: 95% 92% 94% 97%    Intake/Output Summary (Last 24 hours) at 04/29/2022 0959 Last data filed at 04/29/2022 4097 Gross per 24 hour  Intake 1144.98 ml  Output 1700 ml  Net -555.02 ml   There were no vitals filed for this visit.  General appearance: Awake alert.  In no distress.  Mildly distracted Resp: Clear to auscultation bilaterally.  Normal effort Cardio: S1-S2 is normal regular.  No S3-S4.  No rubs murmurs or bruit GI: Abdomen is soft.  Nontender nondistended.  Bowel sounds are present normal.  No masses organomegaly Extremities: No edema.  Full range of motion of lower extremities. Neurologic: Left hemiparesis is noted   Lab Results:  Data Reviewed: I have personally reviewed following labs and reports of the imaging studies  CBC: Recent Labs  Lab 04/27/22 1701 04/28/22 0546 04/29/22 0926  WBC 8.2 8.4 6.9  NEUTROABS 6.4  --  6.2  HGB 11.9* 11.7* 11.5*  HCT 37.1* 37.1* 35.4*  MCV 91.6 92.8 90.1  PLT 345 366 353    Basic Metabolic Panel: Recent Labs  Lab 04/27/22 1701 04/28/22 0546  NA 141 139  K 4.0 4.9  CL 106 105  CO2 23 23  GLUCOSE 79 93  BUN 12 13  CREATININE 0.67 0.60*  CALCIUM 9.1 9.1  MG 1.9  --   PHOS 3.7  --     GFR: Estimated Creatinine Clearance: 97 mL/min (A) (by C-G formula based on SCr of  0.6 mg/dL (L)).  Liver Function Tests: Recent Labs  Lab 04/27/22 1701 04/28/22 0546  AST 14* 27  ALT 14 14  ALKPHOS 81 79  BILITOT 0.8 1.3*  PROT 6.5 6.5  ALBUMIN 3.5 3.3*    Recent Labs  Lab 04/27/22 2150  AMMONIA 19     CBG: Recent Labs  Lab 04/28/22 0600 04/28/22 1323  GLUCAP 101* 143*    Thyroid Function Tests: Recent Labs    04/28/22 0546  TSH 1.421    Recent Results (from the past 240 hour(s))  Culture, Urine (Do not remove urinary catheter, catheter placed by urology or difficult to place)     Status: None (Preliminary result)   Collection Time: 04/27/22  6:16 PM   Specimen: Urine, Catheterized  Result Value Ref Range Status   Specimen Description   Final    URINE, CATHETERIZED Performed at [facility] [address], [place], [place] [address]  Special Requests   Final    NONE Performed at [facility], [place] [address], [place], [place] [address]    Culture   Final    CULTURE REINCUBATED FOR BETTER GROWTH Performed at [facility], [place] [address] [place], [place] [address]    Report Status PENDING  Incomplete      Radiology Studies: CT Head W or Wo Contrast  Result Date: 04/27/2022 CLINICAL DATA:  Delirium. Recent craniotomy for meningioma resection. EXAM: CT HEAD WITHOUT AND WITH CONTRAST TECHNIQUE: Contiguous axial images were obtained from the base of the skull through the vertex without and with intravenous contrast. RADIATION DOSE REDUCTION: This exam was performed according to the departmental dose-optimization program which includes automated exposure control, adjustment of the mA and/or kV according to patient size and/or use of iterative reconstruction technique. CONTRAST:  104m OMNIPAQUE IOHEXOL 300 MG/ML  SOLN COMPARISON:  CT head 02/28/2022 FINDINGS: Brain: Postop right convexity craniotomy for tumor resection. Large enhancing lesion in the right parietal convexity has been resected. There  is irregular rim enhancement of a fluid collection at the surgical site which could represent abscess. This contains gas and appears extra-axial. Multiple additional enhancing masses compatible with meningioma. These are stable from prior studies. There is edema in the left frontal lobe and left parietal lobe due to enhancing mass lesions. Ventricle size normal. 5 mm midline shift to the right due to mass-effect. Vascular: Aneurysm clipping in the right posterior communicating artery region. Skull: Right convexity craniotomy.  Right temporal craniotomy. Sinuses/Orbits: Paranasal sinuses clear.  Negative orbit Other: None IMPRESSION: Postop right parietal convexity craniotomy for tumor resection. There is a rim enhancing fluid collection containing gas at the surgical site, suspicious for abscess. This measures approximately 25 x 13 mm. Numerous residual enhancing meningioma are noted bilaterally. There is edema in the left frontal lobe left parietal lobe with mild midline shift to the right. These results were called by telephone at the time of interpretation on 04/27/2022 at 8:00 pm [facility] who verbally acknowledged these results. Electronically Signed   By: CFranchot GalloM.D.   On: 04/27/2022 20:00       LOS: 1 day   Mercedez Boule [employer] [email or web address]  04/29/2022, 9:59 AM

## 2022-04-29 NOTE — Evaluation (Signed)
Occupational Therapy Evaluation Patient Details Name: Patrick Buchanan MRN: [id number] DOB: 1950-12-09 Today's Date: 04/29/2022   History of Present Illness 71 y/o male presented to ED on 04/27/22 for increased agitation, aggression, and suicidal ideation. PMH: hx of meningioma s/p resection 02/2022 with L hemiparesis, seizures, T2DM, depression   Clinical Impression   Pt admitted for concerns listed above. PTA pt reported that he was requiring assistance since his previous surgeries. At this time, he is requiring max A for all ADL's bed level, however with mod-max A for bed mobility and min-max A for stability EOB as pt wants to lean to the R and has difficulty returning to midline. As pt has 24/7 caregivers, recommend that pt return home when medically stable and recommending OP OT to maximize strength and ADL performance. OT will continue to follow acutely.      Recommendations for follow up therapy are one component of a multi-disciplinary discharge planning process, led by the attending physician.  Recommendations may be updated based on patient status, additional functional criteria and insurance authorization.   Follow Up Recommendations  Outpatient OT    Assistance Recommended at Discharge Frequent or constant Supervision/Assistance  Patient can return home with the following A lot of help with walking and/or transfers;A lot of help with bathing/dressing/bathroom    Functional Status Assessment  Patient has had a recent decline in their functional status and demonstrates the ability to make significant improvements in function in a reasonable and predictable amount of time.  Equipment Recommendations  None recommended by OT    Recommendations for Other Services       Precautions / Restrictions Precautions Precautions: Fall Precaution Comments: L hemi Restrictions Weight Bearing Restrictions: No      Mobility Bed Mobility Overal bed mobility: Needs Assistance Bed  Mobility: Supine to Sit, Sit to Supine     Supine to sit: Mod assist, +2 for physical assistance, +2 for safety/equipment Sit to supine: Max assist, +2 for physical assistance, +2 for safety/equipment   General bed mobility comments: able to initiate coming towards EOB and advancing L LE but required modA+2 for trunk elevation and repositioning hips towards EOB. MaxA+2 to return to supine    Transfers                   General transfer comment: deferred this session. Will likely need STEDY      Balance Overall balance assessment: Needs assistance Sitting-balance support: Single extremity supported, Feet supported Sitting balance-Leahy Scale: Poor Sitting balance - Comments: initially poor requiring maxA to maintain midline sitting posture. With time, improves to close supervision                                   ADL either performed or assessed with clinical judgement   ADL Overall ADL's : Needs assistance/impaired Eating/Feeding: Set up;Bed level   Grooming: Minimal assistance;Bed level   Upper Body Bathing: Minimal assistance;Bed level   Lower Body Bathing: Maximal assistance;Bed level   Upper Body Dressing : Minimal assistance;Bed level   Lower Body Dressing: Maximal assistance;Bed level       Toileting- Clothing Manipulation and Hygiene: Maximal assistance;Bed level         General ADL Comments: Pt near his recent baseline of ADL's at bed level     Vision Baseline Vision/History: 1 Wears glasses Ability to See in Adequate Light: 0 Adequate Patient Visual Report: No change from  baseline Vision Assessment?: No apparent visual deficits     Perception     Praxis      Pertinent Vitals/Pain Pain Assessment Pain Assessment: No/denies pain     Hand Dominance Right   Extremity/Trunk Assessment Upper Extremity Assessment Upper Extremity Assessment: Generalized weakness;LUE deficits/detail LUE Deficits / Details: Limited mobility,  only able to move fingers and wrist LUE Sensation: decreased light touch LUE Coordination: decreased fine motor;decreased gross motor   Lower Extremity Assessment Lower Extremity Assessment: Defer to PT evaluation LLE Deficits / Details: grossly 2-/5 LLE Sensation: decreased light touch   Cervical / Trunk Assessment Cervical / Trunk Assessment: Kyphotic   Communication Communication Communication: No difficulties   Cognition Arousal/Alertness: Awake/alert Behavior During Therapy: WFL for tasks assessed/performed Overall Cognitive Status: No family/caregiver present to determine baseline cognitive functioning                                 General Comments: daughter originally in the room but leaves shortly after arrival so unable to determine baseline cognition     General Comments  VSS on RA - Daughter and grandson present for part of session    Exercises     Shoulder Instructions      Home Living Family/patient expects to be discharged to:: Private residence Living Arrangements: Alone Available Help at Discharge: Personal care attendant;Available 24 hours/day Type of Home: House Home Access: Stairs to enter [employer] of Steps: 3 Entrance Stairs-Rails: Left Home Layout: Able to live on main level with bedroom/bathroom;Two level     Bathroom Shower/Tub: Tub/shower unit;Door   [employer] Toilet: Standard     Home Equipment: Other (comment);Hospital bed;Wheelchair - power (hoyer lift)   Additional Comments: Has PCA 24 hours/day      Prior Functioning/Environment Prior Level of Function : Needs assist             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 Has not stood since beginning of 03/2022          OT Problem List: Decreased strength;Decreased activity tolerance;Decreased range of motion;Impaired balance (sitting and/or standing);Decreased coordination;Decreased safety awareness;Impaired  sensation;Impaired tone;Impaired UE functional use      OT Treatment/Interventions: Self-care/ADL training;Therapeutic exercise;Energy conservation;DME and/or AE instruction;Therapeutic activities;Patient/family education;Balance training    OT Goals(Current goals can be found in the care plan section) Acute Rehab OT Goals Patient Stated Goal: To get stronger OT Goal Formulation: With patient Time For Goal Achievement: 05/13/22 Potential to Achieve Goals: Good ADL Goals Pt Will Perform Eating: with set-up;sitting Pt Will Perform Grooming: with set-up;sitting Pt/caregiver will Perform Home Exercise Program: Increased ROM;Increased strength;Left upper extremity;Independently;With written HEP provided Additional ADL Goal #1: Pt will complete bed mobility with supervision as a precursor to seated ADL's.  OT Frequency: Min 2X/week    Co-evaluation PT/OT/SLP Co-Evaluation/Treatment: Yes Reason for Co-Treatment: For patient/therapist safety;To address functional/ADL transfers PT goals addressed during session: Mobility/safety with mobility;Balance OT goals addressed during session: ADL's and self-care;Strengthening/ROM      AM-PAC OT "6 Clicks" Daily Activity     Outcome Measure Help from another person eating meals?: A Little Help from another person taking care of personal grooming?: A Little Help from another person toileting, which includes using toliet, bedpan, or urinal?: A Lot Help from another person bathing (including washing, rinsing, drying)?: A Lot Help from another person to put on and taking off regular upper body clothing?: A Little  Help from another person to put on and taking off regular lower body clothing?: A Lot 6 Click Score: 15   End of Session Nurse Communication: Mobility status  Activity Tolerance: Patient tolerated treatment well Patient left: in bed;with call bell/phone within reach  OT Visit Diagnosis: Other abnormalities of gait and mobility  (R26.89);Muscle weakness (generalized) (M62.81);Hemiplegia and hemiparesis Hemiplegia - Right/Left: Left Hemiplegia - dominant/non-dominant: Non-Dominant Hemiplegia - caused by: Unspecified                Time: 2194-7125 OT Time Calculation (min): 40 min Charges:  OT General Charges $OT Visit: 1 Visit OT Treatments $Therapeutic Activity: 8-22 mins  Greg Eckrich H., OTR/L Acute Rehabilitation  Jeri Jeanbaptiste Elane Yolanda Bonine 04/29/2022, 4:46 PM

## 2022-04-29 NOTE — Evaluation (Signed)
Physical Therapy Evaluation Patient Details Name: Patrick Buchanan MRN: [id number] DOB: [birth year] Today's Date: 04/29/2022  History of Present Illness  71 y/o male presented to ED on 04/27/22 for increased agitation, aggression, and suicidal ideation. PMH: hx of meningioma s/p resection 02/2022 with L hemiparesis, seizures, T2DM, depression  Clinical Impression  Patient admitted with the above. PTA, patient lives at home and has caregiver 24 hours/day to assist. He uses hoyer lift to power w/c as primary mobility and has not stood since 03/2022. Patient presents with L weakness, impaired sitting balance, decreased activity tolerance, and impaired functional mobility. Patient requires mod-maxA+2 for bed mobility and initially maxA to maintain sitting balance but improved with time to close supervision. Patient tangential throughout and very particular with care. Family initially in room on arrival but quickly left so unable to determine baseline cognition. Patient will benefit from skilled PT services during acute stay to address listed deficits. Patient would like referral to OPPT at discharge to increase amount of therapy as HHPT was not enough for him.        Recommendations for follow up therapy are one component of a multi-disciplinary discharge planning process, led by the attending physician.  Recommendations may be updated based on patient status, additional functional criteria and insurance authorization.  Follow Up Recommendations Outpatient PT      Assistance Recommended at Discharge Frequent or constant Supervision/Assistance  Patient can return home with the following  A lot of help with walking and/or transfers;A lot of help with bathing/dressing/bathroom;Assistance with cooking/housework;Direct supervision/assist for financial management;Direct supervision/assist for medications management;Assist for transportation;Help with stairs or ramp for entrance    Equipment Recommendations  None recommended by PT  Recommendations for Other Services       Functional Status Assessment Patient has had a recent decline in their functional status and demonstrates the ability to make significant improvements in function in a reasonable and predictable amount of time.     Precautions / Restrictions Precautions Precautions: Fall Precaution Comments: L hemi Restrictions Weight Bearing Restrictions: No      Mobility  Bed Mobility Overal bed mobility: Needs Assistance Bed Mobility: Supine to Sit, Sit to Supine     Supine to sit: Mod assist, +2 for physical assistance, +2 for safety/equipment Sit to supine: Max assist, +2 for physical assistance, +2 for safety/equipment   General bed mobility comments: able to initiate coming towards EOB and advancing L LE but required modA+2 for trunk elevation and repositioning hips towards EOB. MaxA+2 to return to supine    Transfers                   General transfer comment: deferred this session. Will likely need STEDY    Ambulation/Gait                  Stairs            Wheelchair Mobility    Modified Rankin (Stroke Patients Only)       Balance Overall balance assessment: Needs assistance Sitting-balance support: Single extremity supported, Feet supported Sitting balance-Leahy Scale: Poor Sitting balance - Comments: initially poor requiring maxA to maintain midline sitting posture. With time, improves to close supervision                                     Pertinent Vitals/Pain Pain Assessment Pain Assessment: No/denies pain    Home Living Family/patient expects  to be discharged to:: Private residence Living Arrangements: Alone Available Help at Discharge: Personal care attendant;Available 24 hours/day Type of Home: House   Entrance Stairs-Rails: Left     Home Layout: Able to live on main level with bedroom/bathroom;Two level Home Equipment: Other (comment);Hospital  bed;Wheelchair - power (hoyer lift) Additional Comments: Has PCA 24 hours/day    Prior Function Prior Level of Function : Needs assist             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 Has not stood since beginning of 03/2022       Hand Dominance        Extremity/Trunk Assessment   Upper Extremity Assessment Upper Extremity Assessment: Defer to OT evaluation    Lower Extremity Assessment Lower Extremity Assessment: LLE deficits/detail;Generalized weakness LLE Deficits / Details: grossly 2-/5 LLE Sensation: decreased light touch    Cervical / Trunk Assessment Cervical / Trunk Assessment: Kyphotic  Communication   Communication: No difficulties  Cognition Arousal/Alertness: Awake/alert Behavior During Therapy: WFL for tasks assessed/performed Overall Cognitive Status: No family/caregiver present to determine baseline cognitive functioning                                 General Comments: daughter originally in the room but leaves shortly after arrival so unable to determine baseline cognition        General Comments      Exercises     Assessment/Plan    PT Assessment Patient needs continued PT services  PT Problem List Decreased strength;Decreased activity tolerance;Decreased balance;Decreased mobility;Decreased coordination;Decreased cognition;Decreased knowledge of use of DME;Decreased safety awareness;Decreased knowledge of precautions;Impaired sensation       PT Treatment Interventions DME instruction;Functional mobility training;Therapeutic exercise;Therapeutic activities;Balance training;Patient/family education;Neuromuscular re-education    PT Goals (Current goals can be found in the Care Plan section)  Acute Rehab PT Goals Patient Stated Goal: to get more therapy PT Goal Formulation: With patient Time For Goal Achievement: 05/13/22 Potential to Achieve Goals: Fair    Frequency Min 2X/week      Co-evaluation PT/OT/SLP Co-Evaluation/Treatment: Yes Reason for Co-Treatment: For patient/therapist safety;To address functional/ADL transfers PT goals addressed during session: Mobility/safety with mobility;Balance         AM-PAC PT "6 Clicks" Mobility  Outcome Measure Help needed turning from your back to your side while in a flat bed without using bedrails?: Total Help needed moving from lying on your back to sitting on the side of a flat bed without using bedrails?: Total Help needed moving to and from a bed to a chair (including a wheelchair)?: Total Help needed standing up from a chair using your arms (e.g., wheelchair or bedside chair)?: Total Help needed to walk in hospital room?: Total Help needed climbing 3-5 steps with a railing? : Total 6 Click Score: 6    End of Session   Activity Tolerance: Patient tolerated treatment well Patient left: in bed;with call bell/phone within reach;with bed alarm set Nurse Communication: Mobility status PT Visit Diagnosis: Muscle weakness (generalized) (M62.81);Other abnormalities of gait and mobility (R26.89);Hemiplegia and hemiparesis Hemiplegia - Right/Left: Left Hemiplegia - dominant/non-dominant: Non-dominant Hemiplegia - caused by: Unspecified (craniectomy)    Time: 4098-1191 PT Time Calculation (min) (ACUTE ONLY): 39 min   Charges:   PT Evaluation $PT Eval Moderate Complexity: 1 Mod PT Treatments $Therapeutic Activity: 8-22 mins        Taralee Marcus A. Gilford Rile, PT, DPT Acute  Rehabilitation Services Office [phone]   Linna Hoff 04/29/2022, 3:33 PM

## 2022-04-29 NOTE — Procedures (Signed)
Routine EEG Report  Patrick Buchanan is a 71 y.o. male with a history of multiple meningiomas and possible brain abscess who is undergoing an EEG to evaluate for seizures.  Report: This EEG was acquired with electrodes placed according to the International 10-20 electrode system (including Fp1, Fp2, F3, F4, C3, C4, P3, P4, O1, O2, T3, T4, T5, T6, A1, A2, Fz, Cz, Pz). The following electrodes were missing or displaced: none.  The occipital dominant rhythm was 8.5 Hz. This activity is reactive to stimulation. Drowsiness was manifested by background fragmentation; deeper stages of sleep were not identified. There was focal slowing over the left frontal region. There were no interictal epileptiform discharges. There were no electrographic seizures identified. Photic stimulation and hyperventilation were not performed.  Impression and clinical correlation: This EEG was obtained while awake and drowsy and is abnormal due to focal slowing over the left frontal region.  Su Monks, MD [facility] [phone]  If 7pm- 7am, please page neurology on call as listed in [place].

## 2022-04-29 NOTE — Progress Notes (Addendum)
EEG complete - results pending 

## 2022-04-29 NOTE — Consult Note (Signed)
Neurology Consultation Reason for Consult: Behavioral Changes Referring Physician: Curly Rim  CC: AMS  History is obtained from:PAtient, chart review  HPI: Patrick Buchanan is a 71 y.o. male with a history of diabetes, aneurysm who presented with left-sided weakness and was admitted on 5/4 for resection of a very large parasagittal meningioma.  Following surgery, he was Patrick Buchanan and doing reasonably well and was discharged on 5/16.  Of note, he was started on steroids.  Operatively, and these were continued until 6/1.  He was discharged on lamotrigine as well as Keppra.  Following discharge, he was noted to be easily agitated, with impulsivity and changes in temperament.  He had apparently made a joke to a friend, the way he describes it his friend was coming to visit him and ask "cannot bring anything else?"  To which he responded "bring a gun to shoot me."  He indicates that both him and his friend laughed, with the understanding on both sides of the conversation that it was a joke.  Apparently he suspects that someone overheard it and mentioned it to his daughter's who became concerned.  He denies any intent, states that he would never ask someone else to kill him or kill himself.  Apparently, his agitation does worsen in the evening and at night and is better today in the day.   Past Medical History:  Diagnosis Date   Allergy    hay fever   Blood transfusion without reported diagnosis    Cerebral aneurysm 1992   "leaking"; treated by Dr Sherwood Gambler   Depression    Diabetes mellitus without complication ([place])    type 2   GERD (gastroesophageal reflux disease)    past hx      Family History  Problem Relation Age of Onset   Breast cancer Mother    Diabetes Brother    Colon cancer Neg Hx    Colon polyps Neg Hx    Esophageal cancer Neg Hx    Rectal cancer Neg Hx    Stomach cancer Neg Hx      Social History:  reports that he quit smoking about 18 years ago. His smoking use  included cigarettes. He has a 10.00 pack-year smoking history. He has never used smokeless tobacco. He reports that he does not currently use alcohol. He reports that he does not currently use drugs.   Exam: Current vital signs: BP 107/87 (BP Location: Right Arm)   Pulse 83   Temp 98.5 F (36.9 C) (Oral)   Resp 15   SpO2 96%  Vital signs in last 24 hours: Temp:  [97.9 F (36.6 C)-98.5 F (36.9 C)] 98.5 F (36.9 C) (07/08 1615) Pulse Rate:  [76-88] 83 (07/08 1615) Resp:  [13-20] 15 (07/08 1615) BP: (107-140)/(81-89) 107/87 (07/08 1615) SpO2:  [92 %-97 %] 96 % (07/08 1615)   Physical Exam  Constitutional: Appears well-developed and well-nourished.  Psych: Affect appropriate to situation  Neuro: Mental Status: Patient is awake, alert, oriented to person, place, month, year, and situation. Patient is able to give a clear and coherent history. No signs of aphasia  Cranial Nerves: II: Visual Fields are full. Pupils are equal, round, and reactive to light.   III,IV, VI: EOMI without ptosis or diploplia.  V: Facial sensation is symmetric to temperature VII: Facial movement with mild left facial weakness Motor: He has 2/5 biceps, triceps, 1/5 deltoid, 3-4/5 grip on the left, good strength in the right he has 4/5 strength in the left leg Sensory:  He reports symmetric sensation  Cerebellar: No clear ataxia  DTR: He has clonus in the left upper extremity which is ameliorated with repositioning his hand.     I have reviewed labs in epic and the results pertinent to this consultation are: CMP is unremarkable [place] 16 TSH is normal  I have reviewed the images obtained: CT head-multiple enhancing lesions most consistent with meningioma.  Of note, there is a large left frontal lesion with significant edema.  Impression: 71 year old male with personality change postoperatively in the setting of steroid use, frontal lobe edema, recent major surgery with prolonged  hospitalization.  I suspect that these personality changes are multifactorial in nature, with contributions from his frontal edema, steroid use, and I would also favor changing from [place] given that this can also cause some of this type of behavior.  Unfortunately, it is difficult to say which is the primary contributor, steroids versus frontal edema.  He was weaned off of steroids prior to discharge when his symptoms worsened most, but I think he was still likely experiencing the effects.  His environmental change with discharge could have also contributed to these mental changes.  I think at this point, symptomatic treatment of the psychiatric semiology would be the most prudent course.  Recommendations: 1) continue Lamictal 100 mg twice daily 2) discontinue levetiracetam, start Vimpat and replacement 100 mg twice daily 3) Seroquel 25 mg nightly 4) would consider weaning steroids as tolerated.   Roland Rack, MD [facility] [phone]  If 7pm- 7am, please page neurology on call as listed in [place].

## 2022-04-29 NOTE — Consult Note (Addendum)
[place] Nurse Consult Note: Reason for Consult:Skin tear on left buttock. Stage 2 pressure injury. Wound type:Pressure/shear Pressure Injury POA: Yes Measurement 2.5cm round x 0.1cm Wound QUI:QNVV, moist Drainage (amount, consistency, odor) small serous Periwound:dry, intact Dressing procedure/placement/frequency: Turning and repositioning is in place, as is a foam dressing. I will add an antimicrobial nonadherent gauze (xeroform) as a wound contact layer beneath the silicone foam and have provided guidance for once daily changes. Bilateral pressure redistribution heel boots are provided. Time in the supine position is to be minimized.  [place] nursing team will not follow, but will remain available to this patient, the nursing and medical teams.  Please re-consult if needed.  Thank you for inviting [place] to participate in this patient's Plan of Care.  Maudie Flakes, MSN, RN, CNS, [place], Serita Grammes, [employer], [employer] phone:  [phone]

## 2022-04-29 NOTE — Progress Notes (Signed)
No new events or problems overnight.  Patient denies headache.  Patient dissatisfied with nursing care.  He is awake and alert.  He is oriented and mostly appropriate.  His speech is fluent.  Cranial nerve function normal bilaterally aside from some very minimal left central facial droop.  Left upper extremity with minimal shoulder abduction but he does have improved biceps and triceps function bilaterally.  Wrist extension much improved.  Still has good hand strength and dexterity.  Left lower extremity with 2-3/5 strength which is much improved for wrist been lately.  Cranial wound well-healed.  Patient continues to have difficulty with mood and obsessive personality changes with no clear-cut explanation.  Certainly the patient's left-sided meningiomas could be playing a role particularly his left sphenoid wing meningioma.  I would like him to continue on his Decadron.  I wonder if any medication changes could be made to help with mood stabilization.  I look forward to neurology's input on this.

## 2022-04-30 DIAGNOSIS — G936 Cerebral edema: Secondary | ICD-10-CM

## 2022-04-30 DIAGNOSIS — R4689 Other symptoms and signs involving appearance and behavior: Secondary | ICD-10-CM | POA: Diagnosis not present

## 2022-04-30 DIAGNOSIS — D329 Benign neoplasm of meninges, unspecified: Secondary | ICD-10-CM | POA: Diagnosis not present

## 2022-04-30 DIAGNOSIS — G9341 Metabolic encephalopathy: Secondary | ICD-10-CM | POA: Diagnosis not present

## 2022-04-30 LAB — GLUCOSE, CAPILLARY
Glucose-Capillary: 125 mg/dL — ABNORMAL HIGH (ref 70–99)
Glucose-Capillary: 121 mg/dL — ABNORMAL HIGH (ref 70–99)
Glucose-Capillary: 107 mg/dL — ABNORMAL HIGH (ref 70–99)
Glucose-Capillary: 109 mg/dL — ABNORMAL HIGH (ref 70–99)
Glucose-Capillary: 114 mg/dL — ABNORMAL HIGH (ref 70–99)

## 2022-04-30 LAB — URINE CULTURE: Culture: >=100000 — AB

## 2022-04-30 MED ORDER — ADULT MULTIVITAMIN W/MINERALS CH
1.0000 | ORAL_TABLET | Freq: Every day | ORAL | Status: DC
  Administered 2022-04-30 – 2022-05-13 (×14): 1 via ORAL
  Filled 2022-04-30 (×14): qty 1

## 2022-04-30 MED ORDER — ENSURE ENLIVE PO LIQD
237.0000 mL | Freq: Two times a day (BID) | ORAL | Status: DC
  Administered 2022-05-01 – 2022-05-03 (×5): 237 mL via ORAL

## 2022-04-30 NOTE — Progress Notes (Signed)
NEUROLOGY CONSULTATION PROGRESS NOTE   Date of service: April 30, 2022 Patient Name: Patrick Buchanan MRN:  [id number] DOB:  [birth year]  Brief HPI  Patrick Buchanan is a 71 year old male with personality change after resection of large Right parasaggital meningioma, in the setting of steroid use, frontal lobe edema,  prolonged hospitalization. suspect personality changes are multifactorial in nature, with contributions from his frontal edema, steroid use, prolonged hospitalization and delirium and possibly from [place] which was changed vimpat.  [place] with ?concern for an abscess, neurosurgery does not feel strongly that this is an abscess  Interval Hx   No acute events overnight. He has quite good insight and did fairly well on my brief cognitive assessment at bedside. Reports that a lot of his outburst are when he does not feel like he is well taken care of by team in the hospital and he is just upset that he cant get comfortable.  Does endorse feeling exhausted in the hospital.  Also reports that his L knee hurts with slight movement. Has been told in the past that he had pseudogout of his R Knee and was treated by orthopedics. I let the hospitalist team know.  Vitals   Vitals:   04/29/22 0805 04/29/22 1615 04/29/22 2124 04/30/22 0810  BP: 140/83 107/87 (!) 144/75 (!) 130/94  Pulse: 88 83 88 64  Resp: '20 15 16 11  '$ Temp:  98.5 F (36.9 C) 99.1 F (37.3 C) (!) 97.5 F (36.4 C)  TempSrc: Oral Oral Oral Oral  SpO2: 97% 96% 95% 95%     There is no height or weight on file to calculate BMI.  Physical Exam   General: Laying comfortably in bed; in no acute distress.  HENT: Normal oropharynx and mucosa. Normal external appearance of ears and nose.  Neck: Supple, no pain or tenderness  CV: No JVD. No peripheral edema.  Pulmonary: Symmetric Chest rise. Normal respiratory effort.  Abdomen: Soft to touch, non-tender.  Ext: No cyanosis, edema, or deformity  Skin: No rash. Normal  palpation of skin.   Musculoskeletal: Normal digits and nails by inspection. No clubbing.   Neurologic Examination  Mental status/Cognition: Alert, oriented to self, place, month and year, good attention. Can do calculations, count backwards, serial 7s. Answer complex questions. Speech/language: Fluent, comprehension intact, object naming intact, repetition intact.  Cranial nerves:   CN II Pupils equal and reactive to light, no VF deficits    CN III,IV,VI EOM intact, no gaze preference or deviation, no nystagmus    CN V normal sensation in V1, V2, and V3 segments bilaterally    CN VII no asymmetry, no nasolabial fold flattening    CN VIII normal hearing to speech   CN IX & X normal palatal elevation, no uvular deviation    CN XI 5/5 head turn and 5/5 shoulder shrug bilaterally    CN XII midline tongue protrusion    Motor:  Muscle bulk: poor, tone increased in LUE Mvmt Root Nerve  Muscle Right Left Comments  SA C5/6 Ax Deltoid 5    EF C5/6 Mc Biceps 5 3   EE C6/7/8 Rad Triceps 5 3   WF C6/7 Med FCR     WE C7/8 PIN ECU     F Ab C8/T1 U ADM/FDI 5 5   HF L1/2/3 Fem Illopsoas 5 2   KE L2/3/4 Fem Quad 5 3   DF L4/5 D Peron Tib Ant 5 1   PF S1/2 Tibial Grc/Sol 5 1  Sensation:  Light touch Decreased in LLE to touch.   Pin prick    Temperature    Vibration   Proprioception    Coordination/Complex Motor:  - Finger to Nose intact in RUE, unable to do with LUE - Heel to shin unable to do - Rapid alternating movement are intact in RUE, slowed in LUE Gait: deferred for patient safety.  Labs   Basic Metabolic Panel:  Lab Results  Component Value Date   NA 141 04/29/2022   K 4.1 04/29/2022   CO2 23 04/29/2022   GLUCOSE 120 (H) 04/29/2022   BUN 15 04/29/2022   CREATININE 0.70 04/29/2022   CALCIUM 9.3 04/29/2022   GFRNONAA >60 04/29/2022   GFRAA 92 11/03/2020   HbA1c:  Lab Results  Component Value Date   HGBA1C 5.7 (H) 02/22/2022   LDL:  Lab Results  Component Value  Date   LDLCALC 61 08/23/2021   Urine Drug Screen: No results found for: "LABOPIA", "COCAINSCRNUR", "LABBENZ", "AMPHETMU", "THCU", "LABBARB"  Alcohol Level No results found for: "ETH" No results found for: "PHENYTOIN", "ZONISAMIDE", "LAMOTRIGINE", "LEVETIRACETA" No results found for: "PHENYTOIN", "PHENOBARB", "VALPROATE", "CBMZ"  Imaging and Diagnostic studies   CTH w + w/o contrast: Postop right parietal convexity craniotomy for tumor resection. There is a rim enhancing fluid collection containing gas at the surgical site, suspicious for abscess. This measures approximately 25 x 13 mm.   Numerous residual enhancing meningioma are noted bilaterally. There is edema in the left frontal lobe left parietal lobe with mild midline shift to the right.  Impression   Patrick Buchanan is a 71 y.o. male seen for personality change. He has had recent resection of large Right parasaggital meningioma, also on steroids, frontal lobe edema,  prolonged hospitalization.   His neurologic examination is notable for LUE and LLE weakness and numbness and decreased dexterity. Cognition appears intact on my brief cognitive assessment at bedside.  Suspect personality changes are multifactorial in nature, with contributions from his frontal edema, steroid use, prolonged hospitalization and delirium and possibly from [place] which was changed vimpat.  Low suspicion for seizures but frontal lobe seizures can be tricky and present as behavioral issues/episodes. He seems improved so will hold off but can consider 24 hours of LTM if concern remains about behavioral issues/episodes.  Recommendations  - Will defer workup for questionable surgical site abscess to the neurosurgery team. - Lamictal '100mg'$  BID - continue Vimpat '100mg'$  BID - Seroquel '25mg'$  qhs - Delirium precautions - Low suspicion for seizures but frontal lobe seizures can be tricky and present as behavioral issues/episodes. He seems improved so will  hold off but can consider 24 hours of LTM if concern remains about behavioral issues/episodes. ______________________________________________________________________   Thank you for the opportunity to take part in the care of this patient. If you have any further questions, please contact the neurology consultation attending.  Signed,  [place] Pager Number [phone]

## 2022-04-30 NOTE — Progress Notes (Addendum)
[facility] PROGRESS NOTE   Patrick Buchanan EAV:[id number] DOB: 10/28/1950 DOA: 04/27/2022  PCP: Lorrene Reid, PA-C  Brief History/Interval Summary: 71 y/o male known history of spinal meningioma status post resection of parasagittal tumor 02/2022 Dr. Annette Stable. At baseline he is wheelchair-bound and needs a lift to transfer and has multiple caregivers at home-24/7. Initially presented to the emergency room on 7/6 after visit with his physiatrist Dr. Tessa Lerner with right eye pain and foreign body sensation and was sent home. Developed bizarre behavior as well as ?Suicidal intent asking people to bring guns he can blow his brains out --found to be encephalopathic--CT head showed rim-enhancing fluid collection containing gas at surgical site?  Abscesses 25 X 13 mm with numerous residual enhancing meningiomas with edema in the left frontal lobe and mild midline shift to the right.  Patient was started on steroids.  Was hospitalized for further management.  Transferred over to [facility].  Consultants: Neurosurgery.  Neurology.  Psychiatry  Procedures: EEG     Subjective/Interval History: Patient denies any complaints this morning.  Denies any headaches or vomiting.  No shortness of breath.    Assessment/Plan:  Acute metabolic encephalopathy Noted to have some personality changes.  Probably from his multiple meningiomas and brain edema.  He was seen by psychiatry and they did not have any recommendations at this time.  They do not feel that the patient is a danger to self.  Suicide precautions have been discontinued. He does have left hemiparesis which is somewhat chronic for him in the setting of the meningiomas. No infectious etiology has been found.  UA noted to be cloudy.  However he is afebrile.  WBC is normal.  Apparently had a indwelling Foley catheter as well which it appears that was removed recently.  So he could have been colonized as well.  Continue to monitor off of  antibiotics at this time. EEG without any epileptiform activity.  TSH normal.  HIV nonreactive.  Sed rate was unremarkable.  Ammonia level was normal. Neurosurgery recommended neurology input.  Appreciated their feedback.  They have changed his Keppra to Vimpat.  To continue with the Lamictal.  Also started on Seroquel at bedtime.  We will see how patient does with this regimen over the next 24 hours.  Meningioma status post surgery Recent surgery was performed by Dr. Annette Stable.  Some vasogenic edema is present on imaging studies.  There was initially some concern for abscess but neurosurgery does not feel that this is the case.   Continue with dexamethasone as per neurosurgery.  Keppra has been changed over to Vimpat as discussed above.  Continue Lamictal.    Left-sided hemiparesis This is secondary to his brain lesions.  Has around-the-clock care at home.  Seen by physical therapy.  Urinary retention with Foley catheter Looks like Foley catheter was discontinued recently.  Monitor urine output.  Watch for retention.  Continue Flomax.  Diabetes mellitus type 2, without complications Monitor CBGs.  Right-sided corneal abrasion Continue eyedrops.  History of depression Continue home medication  Normocytic anemia Hemoglobin is stable.  No evidence of overt blood loss.   DVT Prophylaxis: SCDs Code Status: Full code Family Communication: Discussed with patient.  Discussed with his daughter yesterday.  We will update her again today. Disposition Plan: Hopefully return home when work-up has been completed and if there is improvement in symptoms.  Anticipate discharge in next 24 to 48 hours.  Status is: Inpatient Remains inpatient appropriate because: Acute metabolic encephalopathy in  the setting of brain tumor    Medications: Scheduled:  baclofen  10 mg Oral TID   ciprofloxacin  1 drop Right Eye Q4H while awake   dexamethasone (DECADRON) injection  2 mg Intravenous Q8H   docusate sodium   100 mg Oral BID   escitalopram  20 mg Oral Daily   gabapentin  100 mg Oral BID   insulin aspart  0-9 Units Subcutaneous Q4H   lamoTRIgine  100 mg Oral BID   pantoprazole  40 mg Oral QHS   QUEtiapine  25 mg Oral QHS   rosuvastatin  20 mg Oral Daily   senna-docusate  2 tablet Oral QHS   tamsulosin  0.4 mg Oral QPC supper   Continuous:  sodium chloride 75 mL/hr at 04/29/22 2322   lacosamide (VIMPAT) IV 100 mg (04/30/22 0945)   JJO:[id number] **OR** acetaminophen, bisacodyl, HYDROcodone-acetaminophen, magnesium hydroxide, polyvinyl alcohol, traZODone  Antibiotics: Anti-infectives (From admission, onward)    None       Objective:  Vital Signs  Vitals:   04/29/22 0805 04/29/22 1615 04/29/22 2124 04/30/22 0810  BP: 140/83 107/87 (!) 144/75 (!) 130/94  Pulse: 88 83 88 64  Resp: '20 15 16 11  '$ Temp:  98.5 F (36.9 C) 99.1 F (37.3 C) (!) 97.5 F (36.4 C)  TempSrc: Oral Oral Oral Oral  SpO2: 97% 96% 95% 95%    Intake/Output Summary (Last 24 hours) at 04/30/2022 0949 Last data filed at 04/30/2022 0644 Gross per 24 hour  Intake 1220.05 ml  Output 2450 ml  Net -1229.95 ml    There were no vitals filed for this visit.  General appearance: Awake alert.  In no distress.  Mildly distracted Resp: Clear to auscultation bilaterally.  Normal effort Cardio: S1-S2 is normal regular.  No S3-S4.  No rubs murmurs or bruit GI: Abdomen is soft.  Nontender nondistended.  Bowel sounds are present normal.  No masses organomegaly Extremities: No edema.  Full range of motion of lower extremities. Neurologic: Left hemiparesis    Lab Results:  Data Reviewed: I have personally reviewed following labs and reports of the imaging studies  CBC: Recent Labs  Lab 04/27/22 1701 04/28/22 0546 04/29/22 0926  WBC 8.2 8.4 6.9  NEUTROABS 6.4  --  6.2  HGB 11.9* 11.7* 11.5*  HCT 37.1* 37.1* 35.4*  MCV 91.6 92.8 90.1  PLT 345 366 348     Basic Metabolic Panel: Recent Labs  Lab  04/27/22 1701 04/28/22 0546 04/29/22 0926  NA 141 139 141  K 4.0 4.9 4.1  CL 106 105 108  CO2 '23 23 23  '$ GLUCOSE 79 93 120*  BUN '12 13 15  '$ CREATININE 0.67 0.60* 0.70  CALCIUM 9.1 9.1 9.3  MG 1.9  --   --   PHOS 3.7  --   --      GFR: Estimated Creatinine Clearance: 97 mL/min (by C-G formula based on SCr of 0.7 mg/dL).  Liver Function Tests: Recent Labs  Lab 04/27/22 1701 04/28/22 0546 04/29/22 0926  AST 14* 27 13*  ALT '14 14 14  '$ ALKPHOS 81 79 79  BILITOT 0.8 1.3* 0.6  PROT 6.5 6.5 6.0*  ALBUMIN 3.5 3.3* 3.2*     Recent Labs  Lab 04/27/22 2150  AMMONIA 19      CBG: Recent Labs  Lab 04/28/22 1323 04/29/22 1614 04/29/22 2031 04/30/22 0428 04/30/22 0850  GLUCAP 143* 130* 106* 125* 121*     Thyroid Function Tests: Recent Labs    04/28/22 0546  TSH 1.421     Recent Results (from the past 240 hour(s))  Culture, Urine (Do not remove urinary catheter, catheter placed by urology or difficult to place)     Status: None (Preliminary result)   Collection Time: 04/27/22  6:16 PM   Specimen: Urine, Catheterized  Result Value Ref Range Status   Specimen Description   Final    URINE, CATHETERIZED Performed at [facility] [address] [place], [place] [address]    Special Requests   Final    NONE Performed at [facility], [place] [address], [place], [place] [address]    Culture   Final    CULTURE REINCUBATED FOR BETTER GROWTH Performed at [facility], [place] [address], [place],  [address]    Report Status PENDING  Incomplete      Radiology Studies: EEG adult  Result Date: May 27, 2022 Derek Jack, MD     05-27-22  7:15 PM Routine EEG Report Patrick Buchanan is a 71 y.o. male with a history of multiple meningiomas and possible brain abscess who is undergoing an EEG to evaluate for seizures. Report: This EEG was acquired with electrodes placed according to the International 10-20 electrode  system (including Fp1, Fp2, F3, F4, C3, C4, P3, P4, O1, O2, T3, T4, T5, T6, A1, A2, Fz, Cz, Pz). The following electrodes were missing or displaced: none. The occipital dominant rhythm was 8.5 Hz. This activity is reactive to stimulation. Drowsiness was manifested by background fragmentation; deeper stages of sleep were not identified. There was focal slowing over the left frontal region. There were no interictal epileptiform discharges. There were no electrographic seizures identified. Photic stimulation and hyperventilation were not performed. Impression and clinical correlation: This EEG was obtained while awake and drowsy and is abnormal due to focal slowing over the left frontal region. Su Monks, MD [facility] [phone] If 7pm- 7am, please page neurology on call as listed in [place].       LOS: 2 days   [facility] Pager on [email or web address]  04/30/2022, 9:49 AM

## 2022-04-30 NOTE — Progress Notes (Signed)
Initial Nutrition Assessment  DOCUMENTATION CODES:   Not applicable  INTERVENTION:  Liberalize diet from a Heat Healthy to a Regular diet to provide widest variety of menu options to enhance nutritional adequacy Ensure Enlive po BID, each supplement provides 350 kcal and 20 grams of protein. MVI with minerals daily  NUTRITION DIAGNOSIS:   Inadequate oral intake related to chronic illness as evidenced by meal completion < 50%.  GOAL:   Patient will meet greater than or equal to 90% of their needs  MONITOR:   PO intake, Supplement acceptance, Diet advancement, Labs, Weight trends, I & O's  REASON FOR ASSESSMENT:   Consult Assessment of nutrition requirement/status  ASSESSMENT:   Pt admitted from home with acute metabolic encephalopathy likely d/t frontal edema versus steroid use . PMH significant for meningioma s/p resection, L hemiparesis, constipation, T2DM, and seizures.  Unsuccessful attempt to reach pt via phone call to room. Noted 1 documented meal completion on 7/7 of 25%.  Pt utilizes a wheelchair for mobility.   Reviewed wt history. From 03/17-07/06, it appears he has had a 14.2% wt loss which is clinically significant for time frame.  Suspect a degree of malnutrition may be present however unable to confirm at this time. Will reassess as appropriate at follow up.  Pt would benefit from a liberalized diet as well as nutrition supplements given wt loss and meal completion of 25%.   Medications: decadron, colace, SSI 0-9 units Q4h, protonix, senna  Labs: HgbA1c 5.7%, CBG's 106-130 x24 hours  UOP: 2450 ml x24 hours  I/O's: -1585 ml since admission  NUTRITION - FOCUSED PHYSICAL EXAM: RD working remotely. Deferred to follow up.   Diet Order:   Diet Order             Diet regular Room service appropriate? Yes; Fluid consistency: Thin  Diet effective now                   EDUCATION NEEDS:   No education needs have been identified at this time  Skin:   Skin Assessment: Reviewed RN Assessment (L buttock skin tear)  Last BM:  7/9  Height:   Ht Readings from Last 1 Encounters:  04/27/22 6' (1.829 m)    Weight:   Wt Readings from Last 1 Encounters:  04/27/22 90.7 kg    BMI:  There is no height or weight on file to calculate BMI.  Estimated Nutritional Needs:   Kcal:  2200-2400  Protein:  110-125g  Fluid:  >2.2L  Clayborne Dana, RDN, LDN Clinical Nutrition

## 2022-04-30 NOTE — Progress Notes (Signed)
Appreciate neurology's input.  The patient has been changed off his Keppra and Seroquel has been added.  He does seem a little bit less impulsive today.  His speech is less pressured.  He overall seems more appropriate.  His neurologic exam otherwise is stable.  We will continue to observe his situation with his recent medication changes.  Depending on how he does with this we may be able to avoid further surgery on his left-sided meningiomas.

## 2022-05-01 ENCOUNTER — Inpatient Hospital Stay (HOSPITAL_COMMUNITY): Payer: Medicare HMO

## 2022-05-01 DIAGNOSIS — R4689 Other symptoms and signs involving appearance and behavior: Secondary | ICD-10-CM | POA: Diagnosis not present

## 2022-05-01 DIAGNOSIS — D329 Benign neoplasm of meninges, unspecified: Secondary | ICD-10-CM | POA: Diagnosis not present

## 2022-05-01 LAB — CBC
HCT: 32.9 % — ABNORMAL LOW (ref 39.0–52.0)
Hemoglobin: 11 g/dL — ABNORMAL LOW (ref 13.0–17.0)
MCH: 29.5 pg (ref 26.0–34.0)
MCHC: 33.4 g/dL (ref 30.0–36.0)
MCV: 88.2 fL (ref 80.0–100.0)
Platelets: 326 10*3/uL (ref 150–400)
RBC: 3.73 MIL/uL — ABNORMAL LOW (ref 4.22–5.81)
RDW: 14 % (ref 11.5–15.5)
WBC: 8.4 10*3/uL (ref 4.0–10.5)
nRBC: 0 % (ref 0.0–0.2)

## 2022-05-01 LAB — GLUCOSE, CAPILLARY
Glucose-Capillary: 100 mg/dL — ABNORMAL HIGH (ref 70–99)
Glucose-Capillary: 101 mg/dL — ABNORMAL HIGH (ref 70–99)
Glucose-Capillary: 104 mg/dL — ABNORMAL HIGH (ref 70–99)
Glucose-Capillary: 114 mg/dL — ABNORMAL HIGH (ref 70–99)
Glucose-Capillary: 134 mg/dL — ABNORMAL HIGH (ref 70–99)
Glucose-Capillary: 134 mg/dL — ABNORMAL HIGH (ref 70–99)

## 2022-05-01 LAB — BASIC METABOLIC PANEL
Anion gap: 10 (ref 5–15)
BUN: 14 mg/dL (ref 8–23)
CO2: 25 mmol/L (ref 22–32)
Calcium: 9.1 mg/dL (ref 8.9–10.3)
Chloride: 104 mmol/L (ref 98–111)
Creatinine, Ser: 0.73 mg/dL (ref 0.61–1.24)
GFR, Estimated: >60 mL/min (ref >60)
Glucose, Bld: 111 mg/dL — ABNORMAL HIGH (ref 70–99)
Potassium: 3.7 mmol/L (ref 3.5–5.1)
Sodium: 139 mmol/L (ref 135–145)

## 2022-05-01 MED ORDER — INSULIN ASPART 100 UNIT/ML IJ SOLN
0.0000 [IU] | Freq: Three times a day (TID) | INTRAMUSCULAR | Status: DC
  Administered 2022-05-01 – 2022-05-05 (×7): 1 [IU] via SUBCUTANEOUS

## 2022-05-01 NOTE — Progress Notes (Signed)
[facility] PROGRESS NOTE   Patrick Buchanan OEU:[id number] DOB: 1951/10/03 DOA: 04/27/2022  PCP: Lorrene Reid, PA-C  Brief History/Interval Summary: 71 y/o male known history of spinal meningioma status post resection of parasagittal tumor 02/2022 Dr. Annette Stable. At baseline he is wheelchair-bound and needs a lift to transfer and has multiple caregivers at home-24/7. Initially presented to the emergency room on 7/6 after visit with his physiatrist Dr. Tessa Lerner with right eye pain and foreign body sensation and was sent home. Developed bizarre behavior as well as ?Suicidal intent asking people to bring guns he can blow his brains out --found to be encephalopathic--CT head showed rim-enhancing fluid collection containing gas at surgical site?  Abscesses 25 X 13 mm with numerous residual enhancing meningiomas with edema in the left frontal lobe and mild midline shift to the right.  Patient was started on steroids.  Was hospitalized for further management.  Transferred over to [facility].  Consultants: Neurosurgery.  Neurology.  Psychiatry  Procedures: EEG     Subjective/Interval History: Patient denies any complaints this morning.  He however does mention that he has been having some issues in his left knee for a while.  Hurts when somebody tries to bend it too far.  Denies any falls or injuries to that site.  Requesting a splint for his left wrist.  Denies any shortness of breath or chest pain.  No nausea or vomiting.  Assessment/Plan:  Acute metabolic encephalopathy Noted to have some personality changes.  Probably from his multiple meningiomas and brain edema.  He was seen by psychiatry and they did not have any recommendations at this time.  They do not feel that the patient is a danger to self.  Suicide precautions have been discontinued. He does have left hemiparesis which is somewhat chronic for him in the setting of the meningiomas. No infectious etiology has been found.   UA noted to be cloudy.  However he is afebrile.  WBC is normal.  Apparently had a indwelling Foley catheter as well which it appears that was removed recently.  So he could have been colonized as well.  He is being monitored off of antibiotics. EEG without any epileptiform activity.  TSH normal.  HIV nonreactive.  Sed rate was unremarkable.  Ammonia level was normal. Neurosurgery recommended neurology input.   Was seen by neurology.  They have changed his Keppra to Vimpat.  To continue with the Lamictal.  Also started on Seroquel at bedtime.   Seems to be stable for the most part with these medication changes.  Meningioma status post surgery Recent surgery was performed by Dr. Annette Stable.  Some vasogenic edema is present on imaging studies.  There was initially some concern for abscess but neurosurgery does not feel that this is the case.   Continue with dexamethasone as per neurosurgery.   Keppra has been changed over to Vimpat as discussed above.  Continue Lamictal.   Further management per neurosurgery.  Left-sided hemiparesis This is secondary to his brain lesions.  Has around-the-clock care at home.  Seen by physical therapy.  Wrist splint for the left wrist.  Left knee pain Knee was examined.  No swelling erythema or warmth is noted.  Passive movements do not elicit significant tenderness.  Do not anticipate any fractures.  Do not anticipate any major joint effusion.  He does have a history of pseudogout.  He is already getting steroids.  We will do a portable x-ray  Urinary retention with Foley catheter Looks like  Foley catheter was discontinued recently.  Monitor urine output.  Watch for retention.  Continue Flomax.  Diabetes mellitus type 2, without complications Monitor CBGs.  Right-sided corneal abrasion Continue eyedrops.  History of depression Continue home medication  Normocytic anemia Hemoglobin is stable.  No evidence of overt blood loss.   DVT Prophylaxis: SCDs Code  Status: Full code Family Communication: Discussed with patient.  Daughter being updated daily. Disposition Plan: Seems to be stable.  Neurosurgery wanted him observed till Tuesday.  Family is requesting to speak with social worker as they think that patient will need more assistance.  They are having difficulty getting caregivers at home.  Patient was seen by physical therapy and outpatient PT was recommended.  Status is: Inpatient Remains inpatient appropriate because: Acute metabolic encephalopathy in the setting of brain tumor    Medications: Scheduled:  baclofen  10 mg Oral TID   ciprofloxacin  1 drop Right Eye Q4H while awake   dexamethasone (DECADRON) injection  2 mg Intravenous Q8H   docusate sodium  100 mg Oral BID   escitalopram  20 mg Oral Daily   feeding supplement  237 mL Oral BID BM   gabapentin  100 mg Oral BID   insulin aspart  0-9 Units Subcutaneous TID WC   lamoTRIgine  100 mg Oral BID   multivitamin with minerals  1 tablet Oral Daily   pantoprazole  40 mg Oral QHS   QUEtiapine  25 mg Oral QHS   rosuvastatin  20 mg Oral Daily   senna-docusate  2 tablet Oral QHS   tamsulosin  0.4 mg Oral QPC supper   Continuous:  sodium chloride 75 mL/hr at 05/01/22 0540   lacosamide (VIMPAT) IV Stopped (04/30/22 2257)   CWC:[id number] **OR** acetaminophen, bisacodyl, HYDROcodone-acetaminophen, magnesium hydroxide, polyvinyl alcohol, traZODone  Antibiotics: Anti-infectives (From admission, onward)    None       Objective:  Vital Signs  Vitals:   04/30/22 2033 04/30/22 2230 05/01/22 0545 05/01/22 0746  BP: 126/84 (!) 146/81 (!) 155/100 (!) 145/85  Pulse: 74 80 65 71  Resp: '18 19 17 17  '$ Temp: 97.9 F (36.6 C) 97.8 F (36.6 C) 98.9 F (37.2 C) 97.7 F (36.5 C)  TempSrc: Oral Oral Axillary Oral  SpO2: 93% 94% 93% 91%    Intake/Output Summary (Last 24 hours) at 05/01/2022 0737 Last data filed at 05/01/2022 0600 Gross per 24 hour  Intake 1761.7 ml  Output 3125  ml  Net -1363.3 ml    There were no vitals filed for this visit.  General appearance: Awake alert.  In no distress Resp: Clear to auscultation bilaterally.  Normal effort Cardio: S1-S2 is normal regular.  No S3-S4.  No rubs murmurs or bruit GI: Abdomen is soft.  Nontender nondistended.  Bowel sounds are present normal.  No masses organomegaly Extremities: No swelling of the left knee.  No erythema.  No warmth.  Passive movement did not elicit significant tenderness. Neurologic: Left hemiparesis.      Lab Results:  Data Reviewed: I have personally reviewed following labs and reports of the imaging studies  CBC: Recent Labs  Lab 04/27/22 1701 04/28/22 0546 04/29/22 0926 05/01/22 0049  WBC 8.2 8.4 6.9 8.4  NEUTROABS 6.4  --  6.2  --   HGB 11.9* 11.7* 11.5* 11.0*  HCT 37.1* 37.1* 35.4* 32.9*  MCV 91.6 92.8 90.1 88.2  PLT 345 366 348 326     Basic Metabolic Panel: Recent Labs  Lab 04/27/22 1701 04/28/22 0546 04/29/22  0174 05/01/22 0049  NA 141 139 141 139  K 4.0 4.9 4.1 3.7  CL 106 105 108 104  CO2 '23 23 23 25  '$ GLUCOSE 79 93 120* 111*  BUN '12 13 15 14  '$ CREATININE 0.67 0.60* 0.70 0.73  CALCIUM 9.1 9.1 9.3 9.1  MG 1.9  --   --   --   PHOS 3.7  --   --   --      GFR: Estimated Creatinine Clearance: 95.7 mL/min (by C-G formula based on SCr of 0.73 mg/dL).  Liver Function Tests: Recent Labs  Lab 04/27/22 1701 04/28/22 0546 05/05/22 0926  AST 14* 27 13*  ALT '14 14 14  '$ ALKPHOS 81 79 79  BILITOT 0.8 1.3* 0.6  PROT 6.5 6.5 6.0*  ALBUMIN 3.5 3.3* 3.2*     Recent Labs  Lab 04/27/22 2150  AMMONIA 19      CBG: Recent Labs  Lab 04/30/22 1613 04/30/22 2208 05/01/22 0057 05/01/22 0545 05/01/22 0750  GLUCAP 109* 114* 104* 100* 101*     Thyroid Function Tests: No results for input(s): "TSH", "T4TOTAL", "FREET4", "T3FREE", "THYROIDAB" in the last 72 hours.   Recent Results (from the past 240 hour(s))  Culture, Urine (Do not remove urinary  catheter, catheter placed by urology or difficult to place)     Status: Abnormal   Collection Time: 04/27/22  6:16 PM   Specimen: Urine, Catheterized  Result Value Ref Range Status   Specimen Description   Final    URINE, CATHETERIZED Performed at [facility] [address], [place], [place] [address]    Special Requests   Final    NONE Performed at [facility], [place] [address] [place], [place] [address]    Culture (A)  Final    >=100,000 COLONIES/mL AEROCOCCUS URINAE Standardized susceptibility testing for this organism is not available. Performed at [facility], [place] [address], [place], [place] [address]    Report Status 04/30/2022 FINAL  Final      Radiology Studies: EEG adult  Result Date: 05-05-22 Derek Jack, MD     05-05-22  7:15 PM Routine EEG Report OAKLYN MANS is a 71 y.o. male with a history of multiple meningiomas and possible brain abscess who is undergoing an EEG to evaluate for seizures. Report: This EEG was acquired with electrodes placed according to the International 10-20 electrode system (including Fp1, Fp2, F3, F4, C3, C4, P3, P4, O1, O2, T3, T4, T5, T6, A1, A2, Fz, Cz, Pz). The following electrodes were missing or displaced: none. The occipital dominant rhythm was 8.5 Hz. This activity is reactive to stimulation. Drowsiness was manifested by background fragmentation; deeper stages of sleep were not identified. There was focal slowing over the left frontal region. There were no interictal epileptiform discharges. There were no electrographic seizures identified. Photic stimulation and hyperventilation were not performed. Impression and clinical correlation: This EEG was obtained while awake and drowsy and is abnormal due to focal slowing over the left frontal region. Su Monks, MD [facility] [phone] If 7pm- 7am, please page neurology on call as listed in [place].       LOS: 3 days    [facility] Pager on [email or web address]  05/01/2022, 9:09 AM

## 2022-05-01 NOTE — Progress Notes (Signed)
Orthopedic Tech Progress Note Patient Details:  Patrick Buchanan 01-08-1951 [id number]  Ortho Devices Type of Ortho Device: Velcro wrist splint Ortho Device/Splint Location: LUE Ortho Device/Splint Interventions: Ordered, Application, Adjustment   Post Interventions Patient Tolerated: Well Instructions Provided: Care of device  Patrick Buchanan 05/01/2022, 12:42 PM

## 2022-05-01 NOTE — Progress Notes (Signed)
Through out the shift multiple times asked patient to turn R or L but he preferred to stay on his bach or supine.

## 2022-05-01 NOTE — Care Management Important Message (Signed)
Important Message  Patient Details  Name: Patrick Buchanan MRN: [id number] Date of Birth: 1951-03-20   Medicare Important Message Given:  Yes     Orbie Pyo 05/01/2022, 3:58 PM

## 2022-05-01 NOTE — Progress Notes (Signed)
Seems a little more anxious and irritable again today.  His speech is more pressured.  He reports being dissatisfied from his nursing care.  Neurologic exam is stable.  Yesterday felt like the patient was better but today I am not so sure.  I would like to give him a few more days to see how the medications stabilize.  I discussed that some of his symptoms may be caused by irritation of his frontal lobe by his sphenoid wing meningioma.  I explained to him that we can consider surgical resection in hopes of improving some of the symptoms however the patient would have a very significant risk of aphasia following the surgery.  This is not a risk that he is willing to undertake and he states that he will not consent to surgery.  We may consider radiation therapy in hopes of slowing tumor growth as a temporizing measure.  I will discuss things further with Dr. Tammi Klippel.

## 2022-05-02 DIAGNOSIS — D329 Benign neoplasm of meninges, unspecified: Secondary | ICD-10-CM | POA: Diagnosis not present

## 2022-05-02 DIAGNOSIS — G936 Cerebral edema: Secondary | ICD-10-CM | POA: Diagnosis not present

## 2022-05-02 DIAGNOSIS — G9341 Metabolic encephalopathy: Secondary | ICD-10-CM | POA: Diagnosis not present

## 2022-05-02 DIAGNOSIS — R4689 Other symptoms and signs involving appearance and behavior: Secondary | ICD-10-CM | POA: Diagnosis not present

## 2022-05-02 LAB — GLUCOSE, CAPILLARY
Glucose-Capillary: 105 mg/dL — ABNORMAL HIGH (ref 70–99)
Glucose-Capillary: 92 mg/dL (ref 70–99)
Glucose-Capillary: 127 mg/dL — ABNORMAL HIGH (ref 70–99)
Glucose-Capillary: 135 mg/dL — ABNORMAL HIGH (ref 70–99)
Glucose-Capillary: 134 mg/dL — ABNORMAL HIGH (ref 70–99)

## 2022-05-02 NOTE — TOC Progression Note (Addendum)
Transition of Care [facility]) - Progression Note    Patient Details  Name: Patrick Buchanan MRN: [id number] Date of Birth: [birth year]  Transition of Care [facility]) CM/SW Contact  Cyndi Bender, RN Phone Number: 05/02/2022, 3:31 PM  Clinical Narrative:    Damaris Schooner to Lura Em, daughters,regarding transition needs. [name] is working with [employer]. This is a company that can provide 24/7 caregivers and also help find long tern care facility. Contacts are Remo Lipps [phone] who will assess patient to determine if they ca provide 24/7 caregivers. Lovey Newcomer [phone] who is assisting to find long term care facility. This RNCM let the daughters know that once they find out if a facility offers a bed to their dad that we can arrange a multi disciplinary meeting to help with this decision. TOC will continue to follow for needs. Expected Discharge Plan: Long Term Nursing Home Barriers to Discharge: Continued Medical Work up  Expected Discharge Plan and Services Expected Discharge Plan: [place]   Discharge Planning Services: CM Consult   Living arrangements for the past 2 months: Single Family Home                           HH Arranged: OT, PT HH Agency: [facility] Date [place]: 04/28/22 Time [place]: [id number] Representative spoke with at [place]: Marjory Lies- active with [facility]. they are aware he is here in hospital   Social Determinants of Health (SDOH) Interventions    Readmission Risk Interventions     No data to display

## 2022-05-02 NOTE — NC FL2 (Addendum)
[place] LEVEL OF CARE SCREENING TOOL     IDENTIFICATION  Patient Name: Patrick Buchanan Birthdate: 1950-11-25 Sex: male Admission Date (Current Location): 04/27/2022  [facility] and [place] Number:  Herbalist and Address:  [facility] [place]. [facility], [place] [address] [place], [place] [address]      Provider Number: [id number]  Attending Physician Name and Address:  Bonnielee Haff, MD  Relative Name and Phone Number:       Current Level of Care: Hospital Recommended Level of Care: [place] Prior Approval Number:    Date Approved/Denied:   PASRR Number:  [id number]  Discharge Plan: SNF    Current Diagnoses: Patient Active Problem List   Diagnosis Date Noted   Vasogenic brain edema ([place]) 56/21/3086   Acute metabolic encephalopathy 57/84/6962   Acute urinary retention 04/27/2022   Brain tumor ([place]) 02/27/2022   Meningioma ([place]) 02/27/2022   Spastic hemiparesis of left nondominant side ([place]) 01/06/2022   History of cerebral aneurysm 01/06/2022   Gait abnormality 01/06/2022   Mixed diabetic hyperlipidemia associated with type 2 diabetes mellitus ([place]) 11/18/2019   Diabetes mellitus ([place]) 11/18/2019   Counseling on health promotion and disease prevention 11/18/2019   History of smoking for 6-10 years 10/29/2019   Pain in right knee 08/25/2019   Vitamin D insufficiency 08/11/2019   Hypertriglyceridemia 08/11/2019   Prediabetes 08/11/2019   Serum sodium elevated 08/11/2019   dysthymia 10/28/2018   (BMI 30.0-34.9) 10/28/2018   Elevated blood pressure reading- no dx HTN 10/28/2018   Family history of diabetes mellitus (DM) 10/28/2018    Orientation RESPIRATION BLADDER Height & Weight     Self, Time, Situation, Place  Normal External catheter Weight: 200 lb  Height:  6'   BEHAVIORAL SYMPTOMS/MOOD NEUROLOGICAL BOWEL NUTRITION STATUS      Continent Diet (See discharge summary.)  AMBULATORY STATUS COMMUNICATION OF  NEEDS Skin   Extensive Assist Verbally Skin abrasions                       Personal Care Assistance Level of Assistance  Bathing, Feeding, Dressing Bathing Assistance: Maximum assistance Feeding assistance: Maximum assistance Dressing Assistance: Maximum assistance     Functional Limitations Info  Sight, Hearing Sight Info: Impaired Hearing Info: Adequate      SPECIAL CARE FACTORS FREQUENCY  PT (By licensed PT), OT (By licensed OT)     PT Frequency: 5x/week OT Frequency: 5x/week            Contractures Contractures Info: Not present    Additional Factors Info  Code Status, Allergies, Insulin Sliding Scale Code Status Info: Full code Allergies Info: Other, Not specified   Insulin Sliding Scale Info: insulin aspart (novoLOG) injection 0-9 Units       Current Medications (05/02/2022):  This is the current hospital active medication list Current Facility-Administered Medications  Medication Dose Route Frequency Provider Last Rate Last Admin   0.9 %  sodium chloride infusion   Intravenous Continuous Nita Sells, MD 75 mL/hr at 05/02/22 0526 New Bag at 05/02/22 0526   acetaminophen (TYLENOL) tablet 650 mg  650 mg Oral Q6H PRN Toy Baker, MD       Or   acetaminophen (TYLENOL) suppository 650 mg  650 mg Rectal Q6H PRN Doutova, Anastassia, MD       baclofen (LIORESAL) tablet 10 mg  10 mg Oral TID Toy Baker, MD   10 mg at 05/02/22 0940   bisacodyl (DULCOLAX) suppository  10 mg  10 mg Rectal Daily PRN Doutova, Nyoka Lint, MD       ciprofloxacin (CILOXAN) 0.3 % ophthalmic solution 1 drop  1 drop Right Eye Q4H while awake Nita Sells, MD   1 drop at 05/02/22 0941   dexamethasone (DECADRON) injection 2 mg  2 mg Intravenous Q8H Doutova, Anastassia, MD   2 mg at 05/02/22 0529   docusate sodium (COLACE) capsule 100 mg  100 mg Oral BID Toy Baker, MD   100 mg at 05/02/22 0941   escitalopram (LEXAPRO) tablet 20 mg  20 mg Oral Daily  Doutova, Anastassia, MD   20 mg at 05/02/22 0940   feeding supplement (ENSURE ENLIVE / ENSURE PLUS) liquid 237 mL  237 mL Oral BID BM Bonnielee Haff, MD   237 mL at 05/02/22 1044   gabapentin (NEURONTIN) capsule 100 mg  100 mg Oral BID Toy Baker, MD   100 mg at 05/02/22 0940   HYDROcodone-acetaminophen (NORCO/VICODIN) 5-325 MG per tablet 1 tablet  1 tablet Oral Q4H PRN Toy Baker, MD   1 tablet at 04/29/22 2133   insulin aspart (novoLOG) injection 0-9 Units  0-9 Units Subcutaneous TID WC Bonnielee Haff, MD   1 Units at 05/01/22 1707   lacosamide (VIMPAT) 100 mg in sodium chloride 0.9 % 25 mL IVPB  100 mg Intravenous Q12H Greta Doom, MD 70 mL/hr at 05/02/22 1019 100 mg at 05/02/22 1019   lamoTRIgine (LAMICTAL) tablet 100 mg  100 mg Oral BID Toy Baker, MD   100 mg at 05/02/22 0941   magnesium hydroxide (MILK OF MAGNESIA) suspension 15 mL  15 mL Oral Daily PRN Bonnielee Haff, MD   15 mL at 04/29/22 2154   multivitamin with minerals tablet 1 tablet  1 tablet Oral Daily Bonnielee Haff, MD   1 tablet at 05/02/22 0941   pantoprazole (PROTONIX) EC tablet 40 mg  40 mg Oral QHS Doutova, Nyoka Lint, MD   40 mg at 05/01/22 2119   polyvinyl alcohol (LIQUIFILM TEARS) 1.4 % ophthalmic solution 1 drop  1 drop Both Eyes PRN Doutova, Nyoka Lint, MD   1 drop at 04/28/22 1049   QUEtiapine (SEROQUEL) tablet 25 mg  25 mg Oral QHS Greta Doom, MD   25 mg at 05/01/22 2119   rosuvastatin (CRESTOR) tablet 20 mg  20 mg Oral Daily Doutova, Nyoka Lint, MD   20 mg at 05/02/22 0940   senna-docusate (Senokot-S) tablet 2 tablet  2 tablet Oral QHS Toy Baker, MD   2 tablet at 05/01/22 2118   tamsulosin (FLOMAX) capsule 0.4 mg  0.4 mg Oral QPC supper Toy Baker, MD   0.4 mg at 05/01/22 1659   traZODone (DESYREL) tablet 25-50 mg  25-50 mg Oral QHS PRN Toy Baker, MD   50 mg at 04/29/22 2134     Discharge Medications: Please see discharge summary for  a list of discharge medications.  Relevant Imaging Results:  Relevant Lab Results:   Additional Information SSN: [id number]; COVID: Pfizer 11/11/19, 12/02/19, 10/02/20  Barton Fanny, Student-Social Work

## 2022-05-02 NOTE — Progress Notes (Signed)
[facility] PROGRESS NOTE   Patrick Buchanan CHY:[id number] DOB: 10-15-1951 DOA: 04/27/2022  PCP: Lorrene Reid, PA-C  Brief History/Interval Summary: 71 y/o male known history of spinal meningioma status post resection of parasagittal tumor 02/2022 Dr. Annette Stable. At baseline he is wheelchair-bound and needs a lift to transfer and has multiple caregivers at home-24/7. Initially presented to the emergency room on 7/6 after visit with his physiatrist Dr. Tessa Lerner with right eye pain and foreign body sensation and was sent home. Developed bizarre behavior as well as ?Suicidal intent asking people to bring guns he can blow his brains out --found to be encephalopathic--CT head showed rim-enhancing fluid collection containing gas at surgical site?  Abscesses 25 X 13 mm with numerous residual enhancing meningiomas with edema in the left frontal lobe and mild midline shift to the right.  Patient was started on steroids.  Was hospitalized for further management.  Transferred over to [facility].  Consultants: Neurosurgery.  Neurology.  Psychiatry  Procedures: EEG     Subjective/Interval History: Patient denies any complaints this morning.  Needs to be repositioned in the bed.  No nausea vomiting.  No chest pain or shortness of breath.  Left knee feels about the same.  Assessment/Plan:  Acute metabolic encephalopathy Noted to have some personality changes.  Probably from his multiple meningiomas and brain edema.  He was seen by psychiatry and they did not have any recommendations at this time.  They do not feel that the patient is a danger to self.  Suicide precautions have been discontinued. He does have left hemiparesis which is somewhat chronic for him in the setting of the meningiomas. No infectious etiology has been found.  UA noted to be cloudy.  However he is afebrile.  WBC is normal.  Apparently had a indwelling Foley catheter as well which it appears that was removed recently.   So he could have been colonized as well.  He is being monitored off of antibiotics. EEG without any epileptiform activity.  TSH normal.  HIV nonreactive.  Sed rate was unremarkable.  Ammonia level was normal. Neurosurgery recommended neurology input.   Was seen by neurology.  They have changed his Keppra to Vimpat.  To continue with the Lamictal.  Also started on Seroquel at bedtime.   Seems to be stable.  No behavioral changes noted in the last 48 hours.  Meningioma status post surgery Recent surgery was performed by Dr. Annette Stable.  Some vasogenic edema is present on imaging studies.  There was initially some concern for abscess but neurosurgery does not feel that this is the case.   Continue with dexamethasone as per neurosurgery.   Keppra has been changed over to Vimpat as discussed above.  Continue Lamictal.   Dr. Annette Stable with neurosurgery continues to follow.  He would like the patient to be observed for another few days as per his note.  Left-sided hemiparesis This is secondary to his brain lesions.  Has around-the-clock care at home.  Seen by physical therapy.  Wrist splint for the left wrist.  Left knee pain Knee was examined.  No swelling erythema or warmth is noted.  Passive movements do not elicit significant tenderness.   X-ray of the knee does not show any acute findings.  Evidence of pseudogout is present.  Patient is already getting steroids.  Continue to monitor for now.    Urinary retention with Foley catheter Looks like Foley catheter was discontinued recently.  Monitor urine output.  Watch for retention.  Continue Flomax.  Diabetes mellitus type 2, without complications Monitor CBGs.  Right-sided corneal abrasion Continue eyedrops.  History of depression Continue home medication  Normocytic anemia Hemoglobin is stable.  No evidence of overt blood loss.   DVT Prophylaxis: SCDs Code Status: Full code Family Communication: Discussed with patient.  Daughter being updated  daily. Disposition Plan: Seems to be stable.  Neurosurgery wants to observe him a few more days. He was last seen by PT outpatient PT was recommended.  Daughter has concerns about caring for him at home.  She mentioned that she is running out of resources.  We will request social worker and case manager to talk to her.    Status is: Inpatient Remains inpatient appropriate because: Acute metabolic encephalopathy in the setting of brain tumor    Medications: Scheduled:  baclofen  10 mg Oral TID   ciprofloxacin  1 drop Right Eye Q4H while awake   dexamethasone (DECADRON) injection  2 mg Intravenous Q8H   docusate sodium  100 mg Oral BID   escitalopram  20 mg Oral Daily   feeding supplement  237 mL Oral BID BM   gabapentin  100 mg Oral BID   insulin aspart  0-9 Units Subcutaneous TID WC   lamoTRIgine  100 mg Oral BID   multivitamin with minerals  1 tablet Oral Daily   pantoprazole  40 mg Oral QHS   QUEtiapine  25 mg Oral QHS   rosuvastatin  20 mg Oral Daily   senna-docusate  2 tablet Oral QHS   tamsulosin  0.4 mg Oral QPC supper   Continuous:  sodium chloride 75 mL/hr at 05/02/22 0526   lacosamide (VIMPAT) IV 100 mg (05/01/22 2129)   MOQ:[id number] **OR** acetaminophen, bisacodyl, HYDROcodone-acetaminophen, magnesium hydroxide, polyvinyl alcohol, traZODone  Antibiotics: Anti-infectives (From admission, onward)    None       Objective:  Vital Signs  Vitals:   05/01/22 1555 05/01/22 1952 05/02/22 0016 05/02/22 0346  BP: 129/80 122/76 140/88 (!) 130/96  Pulse: 80 80 83 72  Resp: '17 20 20 20  '$ Temp: 98 F (36.7 C) 98.7 F (37.1 C) 98.5 F (36.9 C) 98.3 F (36.8 C)  TempSrc: Oral Oral Oral Oral  SpO2: 95% 94% 95% 95%    Intake/Output Summary (Last 24 hours) at 05/02/2022 0915 Last data filed at 05/02/2022 0353 Gross per 24 hour  Intake --  Output 2050 ml  Net -2050 ml    There were no vitals filed for this visit.   General appearance: Awake alert.  In no  distress Resp: Clear to auscultation bilaterally.  Normal effort Cardio: S1-S2 is normal regular.  No S3-S4.  No rubs murmurs or bruit GI: Abdomen is soft.  Nontender nondistended.  Bowel sounds are present normal.  No masses organomegaly Extremities: No edema.   Neurologic: Left hemiparesis    Lab Results:  Data Reviewed: I have personally reviewed following labs and reports of the imaging studies  CBC: Recent Labs  Lab 04/27/22 1701 04/28/22 0546 04/29/22 0926 05/01/22 0049  WBC 8.2 8.4 6.9 8.4  NEUTROABS 6.4  --  6.2  --   HGB 11.9* 11.7* 11.5* 11.0*  HCT 37.1* 37.1* 35.4* 32.9*  MCV 91.6 92.8 90.1 88.2  PLT 345 366 348 326     Basic Metabolic Panel: Recent Labs  Lab 04/27/22 1701 04/28/22 0546 04/29/22 0926 05/01/22 0049  NA 141 139 141 139  K 4.0 4.9 4.1 3.7  CL 106 105 108 104  CO2 23  $'23 23 25  'E$ GLUCOSE 79 93 120* 111*  BUN '12 13 15 14  '$ CREATININE 0.67 0.60* 0.70 0.73  CALCIUM 9.1 9.1 9.3 9.1  MG 1.9  --   --   --   PHOS 3.7  --   --   --      GFR: Estimated Creatinine Clearance: 95.7 mL/min (by C-G formula based on SCr of 0.73 mg/dL).  Liver Function Tests: Recent Labs  Lab 04/27/22 1701 04/28/22 0546 04/29/22 0926  AST 14* 27 13*  ALT '14 14 14  '$ ALKPHOS 81 79 79  BILITOT 0.8 1.3* 0.6  PROT 6.5 6.5 6.0*  ALBUMIN 3.5 3.3* 3.2*     Recent Labs  Lab 04/27/22 2150  AMMONIA 19      CBG: Recent Labs  Lab 05/01/22 0750 05/01/22 1153 05/01/22 1559 05/01/22 2220 05/02/22 0852  GLUCAP 101* 134* 134* 114* 105*     Thyroid Function Tests: No results for input(s): "TSH", "T4TOTAL", "FREET4", "T3FREE", "THYROIDAB" in the last 72 hours.   Recent Results (from the past 240 hour(s))  Culture, Urine (Do not remove urinary catheter, catheter placed by urology or difficult to place)     Status: Abnormal   Collection Time: 04/27/22  6:16 PM   Specimen: Urine, Catheterized  Result Value Ref Range Status   Specimen Description   Final     URINE, CATHETERIZED Performed at [facility] [address] [place], [place] [address]    Special Requests   Final    NONE Performed at [facility], [place] [address], [place], [place] [address]    Culture (A)  Final    >=100,000 COLONIES/mL AEROCOCCUS URINAE Standardized susceptibility testing for this organism is not available. Performed at [facility], [place] [address] [place], [place] [address]    Report Status 04/30/2022 FINAL  Final      Radiology Studies: DG Knee Left Port  Result Date: 05/01/2022 CLINICAL DATA:  Pain EXAM: PORTABLE LEFT KNEE - 1-2 VIEW COMPARISON:  None Available. FINDINGS: Osseous alignment is normal. Bone mineralization is normal. No acute or suspicious osseous finding. Faint calcific densities are seen within the medial and lateral patellofemoral compartments and along the lateral joint lines suggesting chondrocalcinosis. There is mild degenerative narrowing of the medial and lateral compartments. No large osteophytes or other signs of advanced osteoarthritis. No appreciable joint effusion. Prominent vessels within the soft tissues adjacent to the knee and proximal tibia, presumably varicose veins. Superficial soft tissues are otherwise unremarkable. IMPRESSION: 1. Mild degenerative narrowing of the medial and lateral compartments. Probable underlying CPPD. No large osteophytes or other signs of advanced osteoarthritis. 2. No acute findings.  No evidence of joint effusion. 3. Prominent vessels within the soft tissues adjacent to the knee and proximal tibia, presumably varicose veins. Electronically Signed   By: Franki Cabot M.D.   On: 05/01/2022 10:22       LOS: 4 days   [facility] Pager on [email or web address]  05/02/2022, 9:15 AM

## 2022-05-02 NOTE — Progress Notes (Signed)
RE: Patrick Buchanan Date of Birth: [birth year] Date: 05/02/22  Please be advised that the above-named patient will require a short-term nursing home stay - anticipated 30 days or less for rehabilitation and strengthening.  The plan is for return home.

## 2022-05-03 DIAGNOSIS — G936 Cerebral edema: Secondary | ICD-10-CM | POA: Diagnosis not present

## 2022-05-03 DIAGNOSIS — R338 Other retention of urine: Secondary | ICD-10-CM | POA: Diagnosis not present

## 2022-05-03 DIAGNOSIS — K5903 Drug induced constipation: Secondary | ICD-10-CM

## 2022-05-03 DIAGNOSIS — E1169 Type 2 diabetes mellitus with other specified complication: Secondary | ICD-10-CM | POA: Diagnosis not present

## 2022-05-03 DIAGNOSIS — G9341 Metabolic encephalopathy: Secondary | ICD-10-CM | POA: Diagnosis not present

## 2022-05-03 LAB — GLUCOSE, CAPILLARY
Glucose-Capillary: 120 mg/dL — ABNORMAL HIGH (ref 70–99)
Glucose-Capillary: 138 mg/dL — ABNORMAL HIGH (ref 70–99)
Glucose-Capillary: 142 mg/dL — ABNORMAL HIGH (ref 70–99)
Glucose-Capillary: 129 mg/dL — ABNORMAL HIGH (ref 70–99)

## 2022-05-03 LAB — BASIC METABOLIC PANEL
Anion gap: 10 (ref 5–15)
BUN: 13 mg/dL (ref 8–23)
CO2: 24 mmol/L (ref 22–32)
Calcium: 8.8 mg/dL — ABNORMAL LOW (ref 8.9–10.3)
Chloride: 106 mmol/L (ref 98–111)
Creatinine, Ser: 0.61 mg/dL (ref 0.61–1.24)
GFR, Estimated: >60 mL/min (ref >60)
Glucose, Bld: 122 mg/dL — ABNORMAL HIGH (ref 70–99)
Potassium: 3.9 mmol/L (ref 3.5–5.1)
Sodium: 140 mmol/L (ref 135–145)

## 2022-05-03 LAB — CBC
HCT: 35.1 % — ABNORMAL LOW (ref 39.0–52.0)
Hemoglobin: 11.5 g/dL — ABNORMAL LOW (ref 13.0–17.0)
MCH: 28.9 pg (ref 26.0–34.0)
MCHC: 32.8 g/dL (ref 30.0–36.0)
MCV: 88.2 fL (ref 80.0–100.0)
Platelets: 336 10*3/uL (ref 150–400)
RBC: 3.98 MIL/uL — ABNORMAL LOW (ref 4.22–5.81)
RDW: 13.8 % (ref 11.5–15.5)
WBC: 7.6 10*3/uL (ref 4.0–10.5)
nRBC: 0 % (ref 0.0–0.2)

## 2022-05-03 MED ORDER — MAGNESIUM HYDROXIDE 400 MG/5ML PO SUSP
30.0000 mL | Freq: Two times a day (BID) | ORAL | Status: DC | PRN
  Administered 2022-05-03 – 2022-05-11 (×6): 30 mL via ORAL
  Filled 2022-05-03 (×7): qty 30

## 2022-05-03 NOTE — Progress Notes (Signed)
PROGRESS NOTE    Patrick Buchanan  ZOX:[id number] DOB: Apr 18, 1951 DOA: 04/27/2022 PCP: Lorrene Reid, PA-C    Brief Narrative:  71 y/o male with past medical history of history of  meningioma status post resection of parasagittal tumor 02/2022 Dr. Annette Stable who is wheelchair-bound at baseline and has multiple caregivers at home presented to the hospital on 04/27/2022 referred by his physiatrist Dr. Tessa Lerner with right eye pain and foreign body sensation also developed bizarre behavior as well as ?Suicidal intent asking people to bring guns he can blow his brains out --found to be encephalopathic--CT head showed rim-enhancing fluid collection containing gas at surgical site?  Abscesses 25 X 13 mm with numerous residual enhancing meningiomas with edema in the left frontal lobe and mild midline shift to the right.  Patient was started on steroids.  Patient was  hospitalized for further management.  Transferred over to [facility].  During hospitalization, patient was seen by neurosurgery and physical therapy.  At this time, plan for rehabilitation and neurosurgery wants to continue to observe.    Assessment and Plan:  Acute metabolic encephalopathy With some personality changes.  Likely from multiple meningioma and cerebral edema.  Seen by psychiatry without any recommendations.  Suicide precautions have been discontinued.  Patient does have left hemiparesis which is chronic.  No infectious etiology has been found.  UA was cloudy but patient is afebrile and no leukocytosis.  He did have a history of indwelling Foley catheter which was removed recently.  EEG done during hospitalization did not show any seizure activity.  TSH normal.  HIV nonreactive.  Sed rate was unremarkable.  Ammonia level was normal.  Neurology was consulted and Keppra has been changed to Vimpat.  Continue Lamictal Seroquel at bedtime.   At this time patient is stable and encephalopathy has improved  Meningioma status post  surgery Imaging with vasogenic edema.  Neurosurgery on board.  Keppra has been changed to Vimpat.  Continue Lamictal.  Continue dexamethasone.  Neurosurgery closely monitoring the patient while in hospital.    Left-sided hemiparesis Seen by physical therapy.  Continues to splint.  He is wheelchair-bound and has caregivers at home.   Left knee pain Improved.  Evidence of pseudogout.  on steroids.  Continue to monitor for now.    Urinary retention  Was on Foley catheter as outpatient which has been removed.  Currently on condom catheter and has been able to urinate by himself.  Continue Flomax   Diabetes mellitus type 2, Continue sliding scale insulin, diabetic diet  Constipation.  Intractable.  Patient takes milk of magnesium at home.  Will change milk of magnesium to 30 mils twice a day as needed.  States that the anemia has not worked.  Right-sided corneal abrasion Continue eyedrops.  History of depression Continue Seroquel and trazodone.   Normocytic anemia Continue to monitor hemoglobin.  Debility, deconditioning, failure to thrive.  Patient's family concerned about care at home.  TOC has been involved for skilled nursing facility placement..    DVT prophylaxis: SCDs Start: 04/27/22 2254   Code Status:     Code Status: Full Code  Disposition: Patient is from home.  Plan for skilled nursing facility.  Status is: Inpatient  Remains inpatient appropriate because: Ongoing observation and monitoring, plan for rehabilitation but   Family Communication: Spoke with the patient at bedside.  Consultants:  Neurosurgery Neurology  Procedures:  EEG  Antimicrobials:  None  Anti-infectives (From admission, onward)    None  Subjective: Today, patient was seen and examined at bedside.  Complains of left-sided weakness and constipation.  Wishes to have milk of magnesia.  Denies any headache nausea vomiting or shortness of breath.  Objective: Vitals:   05/02/22  2042 05/03/22 0009 05/03/22 0451 05/03/22 0747  BP: 126/81 132/83 (!) 141/92 131/88  Pulse: 88 90 81 67  Resp: 17 18 (!) 23 14  Temp: 98.2 F (36.8 C) 97.9 F (36.6 C) 98.2 F (36.8 C) 98.2 F (36.8 C)  TempSrc: Oral Oral Oral Oral  SpO2: 93%       Intake/Output Summary (Last 24 hours) at 05/03/2022 0930 Last data filed at 05/03/2022 0500 Gross per 24 hour  Intake --  Output 4400 ml  Net -4400 ml   There were no vitals filed for this visit.  Physical Examination: There is no height or weight on file to calculate BMI.   General: Thinly built, not in obvious distress HENT:   No scleral pallor or icterus noted. Oral mucosa is moist.  Chest:  Clear breath sounds.  Diminished breath sounds bilaterally. No crackles or wheezes.  CVS: S1 &S2 heard. No murmur.  Regular rate and rhythm. Abdomen: Soft, nontender, nondistended.  Bowel sounds are heard.   Extremities: No cyanosis, clubbing or edema.  Peripheral pulses are palpable. Psych: Alert, awake and oriented, normal mood CNS: Left hemiparesis with mild tremor of the upper extremity. Skin: Warm and dry.  No rashes noted.  Data Reviewed:   CBC: Recent Labs  Lab 04/27/22 1701 04/28/22 0546 04/29/22 0926 05/01/22 0049 05/03/22 0251  WBC 8.2 8.4 6.9 8.4 7.6  NEUTROABS 6.4  --  6.2  --   --   HGB 11.9* 11.7* 11.5* 11.0* 11.5*  HCT 37.1* 37.1* 35.4* 32.9* 35.1*  MCV 91.6 92.8 90.1 88.2 88.2  PLT 345 366 348 326 287    Basic Metabolic Panel: Recent Labs  Lab 04/27/22 1701 04/28/22 0546 04/29/22 0926 05/01/22 0049 05/03/22 0251  NA 141 139 141 139 140  K 4.0 4.9 4.1 3.7 3.9  CL 106 105 108 104 106  CO2 '23 23 23 25 24  '$ GLUCOSE 79 93 120* 111* 122*  BUN '12 13 15 14 13  '$ CREATININE 0.67 0.60* 0.70 0.73 0.61  CALCIUM 9.1 9.1 9.3 9.1 8.8*  MG 1.9  --   --   --   --   PHOS 3.7  --   --   --   --     Liver Function Tests: Recent Labs  Lab 04/27/22 1701 04/28/22 0546 04/29/22 0926  AST 14* 27 13*  ALT '14 14 14   '$ ALKPHOS 81 79 79  BILITOT 0.8 1.3* 0.6  PROT 6.5 6.5 6.0*  ALBUMIN 3.5 3.3* 3.2*     Radiology Studies: DG Knee Left Port  Result Date: 05/01/2022 CLINICAL DATA:  Pain EXAM: PORTABLE LEFT KNEE - 1-2 VIEW COMPARISON:  None Available. FINDINGS: Osseous alignment is normal. Bone mineralization is normal. No acute or suspicious osseous finding. Faint calcific densities are seen within the medial and lateral patellofemoral compartments and along the lateral joint lines suggesting chondrocalcinosis. There is mild degenerative narrowing of the medial and lateral compartments. No large osteophytes or other signs of advanced osteoarthritis. No appreciable joint effusion. Prominent vessels within the soft tissues adjacent to the knee and proximal tibia, presumably varicose veins. Superficial soft tissues are otherwise unremarkable. IMPRESSION: 1. Mild degenerative narrowing of the medial and lateral compartments. Probable underlying CPPD. No large osteophytes or other signs  of advanced osteoarthritis. 2. No acute findings.  No evidence of joint effusion. 3. Prominent vessels within the soft tissues adjacent to the knee and proximal tibia, presumably varicose veins. Electronically Signed   By: Franki Cabot M.D.   On: 05/01/2022 10:22      LOS: 5 days    Flora Lipps, MD [facility] Available via Epic secure chat 7am-7pm After these hours, please refer to coverage provider listed on [email or web address] 05/03/2022, 9:30 AM

## 2022-05-03 NOTE — Progress Notes (Signed)
Physical Therapy Treatment Patient Details Name: Patrick Buchanan MRN: [id number] DOB: 1951/05/11 Today's Date: 05/03/2022   History of Present Illness Pt is a 71 y/o male presented to ED on 04/27/22 for increased agitation, aggression, and suicidal ideation. PMH: hx of meningioma s/p resection 02/2022 with L hemiparesis, seizures, T2DM, depression    PT Comments    Pt continues to require heavy two person physical assistance with all mobility at this time with the use of the STEDY to transfer from the bed to the recliner chair. Updated PT recommendations as pt's family is very concerned with care at home per Hospitalist's note on 7/12. Pt would continue to benefit from skilled physical therapy services at this time while admitted and after d/c to address the below listed limitations in order to improve overall safety and independence with functional mobility.   Recommendations for follow up therapy are one component of a multi-disciplinary discharge planning process, led by the attending physician.  Recommendations may be updated based on patient status, additional functional criteria and insurance authorization.  Follow Up Recommendations  Skilled nursing-short term rehab (<3 hours/day) Can patient physically be transported by private vehicle: No   Assistance Recommended at Discharge Frequent or constant Supervision/Assistance  Patient can return home with the following Two people to help with walking and/or transfers;A lot of help with bathing/dressing/bathroom;Assistance with cooking/housework;Assist for transportation   Equipment Recommendations  None recommended by PT    Recommendations for Other Services       Precautions / Restrictions Precautions Precautions: Fall Precaution Comments: L hemi Restrictions Weight Bearing Restrictions: No     Mobility  Bed Mobility Overal bed mobility: Needs Assistance Bed Mobility: Supine to Sit     Supine to sit: Max assist, +2 for  physical assistance, HOB elevated     General bed mobility comments: increased time and effort needed, use of bed rails, HOB elevated, assistance needed for trunk management and L LE movement off of bed    Transfers Overall transfer level: Needs assistance Equipment used:  (STEDY) Transfers: Sit to/from Stand Sit to Stand: Mod assist, +2 physical assistance, From elevated surface           General transfer comment: increased time and effort, cueing for sequencing and technique, physical assistance needed for support and placement of L UE, mod A x2 needed to power into standing from EOB with bed in an elevated position    Ambulation/Gait               General Gait Details: unable   Stairs             Wheelchair Mobility    Modified Rankin (Stroke Patients Only)       Balance Overall balance assessment: Needs assistance Sitting-balance support: Single extremity supported, Feet supported Sitting balance-Leahy Scale: Poor Sitting balance - Comments: significant posterior lean, initially requiring max A to maintain sitting balance, able to progress to close min guard for brief periods of time with cueing for anterior trunk lean Postural control: Posterior lean Standing balance support: Single extremity supported, Bilateral upper extremity supported Standing balance-Leahy Scale: Poor                              Cognition Arousal/Alertness: Awake/alert Behavior During Therapy: Anxious Overall Cognitive Status: No family/caregiver present to determine baseline cognitive functioning Area of Impairment: Problem solving  Problem Solving: Slow processing, Decreased initiation, Difficulty sequencing, Requires verbal cues          Exercises      General Comments        Pertinent Vitals/Pain Pain Assessment Pain Assessment: Faces Faces Pain Scale: Hurts little more Pain Location: L LE Pain Descriptors  / Indicators: Guarding, Discomfort Pain Intervention(s): Monitored during session, Repositioned    Home Living                          Prior Function            PT Goals (current goals can now be found in the care plan section) Acute Rehab PT Goals PT Goal Formulation: With patient Time For Goal Achievement: 05/13/22 Potential to Achieve Goals: Fair Progress towards PT goals: Progressing toward goals    Frequency    Min 2X/week      PT Plan Discharge plan needs to be updated    Co-evaluation PT/OT/SLP Co-Evaluation/Treatment: Yes Reason for Co-Treatment: To address functional/ADL transfers;For patient/therapist safety PT goals addressed during session: Mobility/safety with mobility;Balance;Proper use of DME;Strengthening/ROM        AM-PAC PT "6 Clicks" Mobility   Outcome Measure  Help needed turning from your back to your side while in a flat bed without using bedrails?: A Lot Help needed moving from lying on your back to sitting on the side of a flat bed without using bedrails?: A Lot Help needed moving to and from a bed to a chair (including a wheelchair)?: Total Help needed standing up from a chair using your arms (e.g., wheelchair or bedside chair)?: Total Help needed to walk in hospital room?: Total Help needed climbing 3-5 steps with a railing? : Total 6 Click Score: 8    End of Session Equipment Utilized During Treatment: Gait belt Activity Tolerance: Patient tolerated treatment well Patient left: in chair;with call bell/phone within reach;with chair alarm set Nurse Communication: Mobility status;Need for lift equipment PT Visit Diagnosis: Muscle weakness (generalized) (M62.81);Other abnormalities of gait and mobility (R26.89);Hemiplegia and hemiparesis Hemiplegia - Right/Left: Left Hemiplegia - dominant/non-dominant: Non-dominant     Time: 9449-6759 PT Time Calculation (min) (ACUTE ONLY): 32 min  Charges:  $Therapeutic Activity: 8-22  mins                     Anastasio Champion, DPT  Acute Rehabilitation Services Office [place] 05/03/2022, 12:51 PM

## 2022-05-03 NOTE — TOC Progression Note (Addendum)
Transition of Care [facility]) - Progression Note    Patient Details  Name: [name] MRN: [id number] Date of Birth: [birth year]  [facility] CM/SW [place], [place] Phone Number: 05/03/2022, 3:55 PM  Clinical Narrative:    CSW spoke with patient's daughters and provided the only bed offer of [facility]. They requested Dr. Trenton Gammon possibly meet with them and the patient to try and convince him to go to SNF as family is unable to meet his needs in the long term and they feel patient respects Dr. Trenton Gammon. CSW reached out to Dr. Trenton Gammon and made daughters aware that he will be by the room tomorrow around 9:30am if they can be present. CSW updated [name] and she expressed appreciation and her sister (and likely her) will be present.   CSW updated admissions at [facility] that family is discussing with patient and Selena Lesser has stated that they will need to come assess patient in person due to [place]. CSW advised that facility will need to wait until patient in agreement with SNF.    Expected Discharge Plan: Long Term Nursing Home Barriers to Discharge: Continued Medical Work up  Expected Discharge Plan and Services Expected Discharge Plan: [place]   Discharge Planning Services: CM Consult   Living arrangements for the past 2 months: Single Family Home                           HH Arranged: OT, PT HH Agency: [facility] Date [place]: 04/28/22 Time [place]: [id number] Representative spoke with at [place]: Marjory Lies- active with [facility]. they are aware he is here in hospital   Social Determinants of Health (SDOH) Interventions    Readmission Risk Interventions     No data to display

## 2022-05-03 NOTE — Progress Notes (Signed)
Occupational Therapy Treatment Patient Details Name: Patrick Buchanan MRN: [id number] DOB: Mar 04, 1951 Today's Date: 05/03/2022   History of present illness Pt is a 71 y/o male presented to ED on 04/27/22 for increased agitation, aggression, and suicidal ideation. PMH: hx of meningioma s/p resection 02/2022 with L hemiparesis, seizures, T2DM, depression   OT comments  Pt seen in conjunction with PT to maximize pts activity tolerance and optimize participation. Pt continues to present with impaired balance, L sided weakness, decreased activity tolerance and generalized deconditioning. Pt currently requires total A LB ADLS and MOD- MAX A +2 for ADL transfer with stedy. Pt with active movement in L wrist and digits ( did note increased tremors in wrist and digits with movement) however unable to mobilize elbow/shoulder d/t pain. Pt with L wrist cock up splint ordered from MD however pt reports the strap rubbing his L palm and reports its uncomforable, will continue to assess for splint needs however at this time encouraged supporting LUE on pillow and continued attempts to work on AROM within pts tolerance. Updated DC recs to SNF per hospitalist note on 7/12 "Patient's family concerned about care at home" do feel pt would benefit from [place] SNF placement to regain strength prior to returning home, will alert OTR about change in POC, will continue to follow acutely for OT needs.    Recommendations for follow up therapy are one component of a multi-disciplinary discharge planning process, led by the attending physician.  Recommendations may be updated based on patient status, additional functional criteria and insurance authorization.    Follow Up Recommendations  Skilled nursing-short term rehab (<3 hours/day)    Assistance Recommended at Discharge Frequent or constant Supervision/Assistance  Patient can return home with the following  A lot of help with walking and/or transfers;A lot of help with  bathing/dressing/bathroom   Equipment Recommendations  None recommended by OT    Recommendations for Other Services      Precautions / Restrictions Precautions Precautions: Fall Precaution Comments: L hemi Restrictions Weight Bearing Restrictions: No       Mobility Bed Mobility Overal bed mobility: Needs Assistance Bed Mobility: Supine to Sit     Supine to sit: Max assist, +2 for physical assistance, HOB elevated     General bed mobility comments: increased time and effort needed, use of bed rails, HOB elevated, assistance needed for trunk management and L LE movement off of bed, very specfic about how pt likes to be helped    Transfers Overall transfer level: Needs assistance Equipment used:  (stedy) Transfers: Sit to/from Stand Sit to Stand: Mod assist, +2 physical assistance, From elevated surface           General transfer comment: increased time and effort, cueing for sequencing and technique, physical assistance needed for support and placement of L UE, mod A x2 needed to power into standing from EOB with bed in an elevated position     Balance Overall balance assessment: Needs assistance Sitting-balance support: Single extremity supported, Feet supported Sitting balance-Leahy Scale: Poor Sitting balance - Comments: significant posterior lean, initially requiring max A to maintain sitting balance, able to progress to close min guard for brief periods of time with cueing for anterior trunk lean Postural control: Posterior lean Standing balance support: Single extremity supported, Bilateral upper extremity supported Standing balance-Leahy Scale: Poor                             ADL  either performed or assessed with clinical judgement   ADL Overall ADL's : Needs assistance/impaired                     Lower Body Dressing: Total assistance;Bed level Lower Body Dressing Details (indicate cue type and reason): to don socks Toilet Transfer:  Maximal assistance;+2 for physical assistance Toilet Transfer Details (indicate cue type and reason): simulated via functional mobility with sit>stand to stedy         Functional mobility during ADLs: Maximal assistance;+2 for physical assistance (stedy) General ADL Comments: pt continues to assist with impaired balance, L sided weakness, decreased strength and generalized deconditioning    Extremity/Trunk Assessment Upper Extremity Assessment Upper Extremity Assessment: (P) Generalized weakness;LUE deficits/detail LUE Deficits / Details: Limited mobility, only able to move fingers and wrist, noted mild tone in L wrist, has wrist cock up splint however c/o that strap is rubbing on palm,cant be modified as it is prefeb, able to hold on to stedy bar to assist with sit>stand LUE: Shoulder pain at rest LUE Sensation: decreased light touch LUE Coordination: decreased fine motor;decreased gross motor   Lower Extremity Assessment Lower Extremity Assessment: Defer to PT evaluation        Vision Baseline Vision/History: 1 Wears glasses Ability to See in Adequate Light: 0 Adequate (reports photophobia) Patient Visual Report: No change from baseline Vision Assessment?: No apparent visual deficits   Perception Perception Perception: Not tested   Praxis Praxis Praxis: Not tested    Cognition Arousal/Alertness: Awake/alert Behavior During Therapy: Anxious Overall Cognitive Status: No family/caregiver present to determine baseline cognitive functioning Area of Impairment: Problem solving, Awareness, Attention, Safety/judgement                   Current Attention Level: Selective     Safety/Judgement: Decreased awareness of deficits Awareness: Intellectual Problem Solving: Slow processing, Decreased initiation, Difficulty sequencing, Requires verbal cues General Comments: oscilating between various topics, particular about needs, initially very motivated to participate but then  quick to decline trying, anxious with movement        Exercises Other Exercises Other Exercises: PROM to L hand and wrist, noted tremors with ROM and mild tone forming    Shoulder Instructions       General Comments VSS on RA    Pertinent Vitals/ Pain       Pain Assessment Pain Assessment: Faces Faces Pain Scale: Hurts little more Pain Location: L LE Pain Descriptors / Indicators: Guarding, Discomfort Pain Intervention(s): Monitored during session, Repositioned  Home Living                                          Prior Functioning/Environment              Frequency  Min 2X/week        Progress Toward Goals  OT Goals(current goals can now be found in the care plan section)  Progress towards OT goals: Progressing toward goals  Acute Rehab OT Goals Time For Goal Achievement: 05/13/22 Potential to Achieve Goals: Good  Plan Discharge plan needs to be updated;Frequency needs to be updated    Co-evaluation      Reason for Co-Treatment: To address functional/ADL transfers;For patient/therapist safety PT goals addressed during session: Mobility/safety with mobility;Balance;Proper use of DME;Strengthening/ROM OT goals addressed during session: ADL's and self-care      AM-PAC OT "6  Clicks" Daily Activity     Outcome Measure   Help from another person eating meals?: A Little Help from another person taking care of personal grooming?: A Little Help from another person toileting, which includes using toliet, bedpan, or urinal?: A Lot Help from another person bathing (including washing, rinsing, drying)?: A Lot Help from another person to put on and taking off regular upper body clothing?: A Lot Help from another person to put on and taking off regular lower body clothing?: A Lot 6 Click Score: 14    End of Session Equipment Utilized During Treatment: Gait belt;Other (comment) (stedy)  OT Visit Diagnosis: Other abnormalities of gait and  mobility (R26.89);Muscle weakness (generalized) (M62.81);Hemiplegia and hemiparesis Hemiplegia - Right/Left: Left Hemiplegia - dominant/non-dominant: Non-Dominant Hemiplegia - caused by: Unspecified   Activity Tolerance Patient tolerated treatment well   Patient Left in chair;with call bell/phone within reach;with chair alarm set   Nurse Communication Mobility status;Other (comment) (up in recliner for one hour and stedy back to bed, please silence IV)        Time: 2725-3664 OT Time Calculation (min): 31 min  Charges: OT General Charges $OT Visit: 1 Visit OT Treatments $Therapeutic Activity: 8-22 mins  Harley Alto., COTA/L Acute Rehabilitation Services [phone]     Precious Haws 05/03/2022, 3:00 PM

## 2022-05-03 NOTE — Progress Notes (Signed)
Overall about the same.  Remains somewhat irritable and remains dissatisfied with overall nursing care.  I discussed situation with more of my partners.  We reviewed his imaging.  Although I have a couple partners who think that surgery would be worth the risk in terms of the potential benefit most however do not feel that surgery would be either a likely to improve his situation significantly or be worth the inherent risk particularly to his speech function.  I discussed situation with Mr. Yo.  He is adamant that he does not wish to consider further surgery at this point.  Radiation has never been shown to be beneficial for this type of tumor.  I know this leaves [place] in a difficult situation.  I do not think the patient is set up to thrive at home in his current functional state.  I am afraid that his only way forward is with long-term nursing care.  I know the patient will not be satisfied with this however.  I do not know that I have any other options however.

## 2022-05-04 DIAGNOSIS — G936 Cerebral edema: Secondary | ICD-10-CM | POA: Diagnosis not present

## 2022-05-04 DIAGNOSIS — D329 Benign neoplasm of meninges, unspecified: Secondary | ICD-10-CM | POA: Diagnosis not present

## 2022-05-04 DIAGNOSIS — G9341 Metabolic encephalopathy: Secondary | ICD-10-CM | POA: Diagnosis not present

## 2022-05-04 DIAGNOSIS — R338 Other retention of urine: Secondary | ICD-10-CM | POA: Diagnosis not present

## 2022-05-04 LAB — GLUCOSE, CAPILLARY
Glucose-Capillary: 96 mg/dL (ref 70–99)
Glucose-Capillary: 138 mg/dL — ABNORMAL HIGH (ref 70–99)
Glucose-Capillary: 124 mg/dL — ABNORMAL HIGH (ref 70–99)
Glucose-Capillary: 116 mg/dL — ABNORMAL HIGH (ref 70–99)

## 2022-05-04 MED ORDER — LACOSAMIDE 50 MG PO TABS
100.0000 mg | ORAL_TABLET | Freq: Two times a day (BID) | ORAL | Status: DC
  Administered 2022-05-04 – 2022-05-13 (×18): 100 mg via ORAL
  Filled 2022-05-04 (×18): qty 2

## 2022-05-04 MED ORDER — FLEET ENEMA 7-19 GM/118ML RE ENEM
1.0000 | ENEMA | Freq: Every day | RECTAL | Status: DC | PRN
  Administered 2022-05-04 – 2022-05-07 (×2): 1 via RECTAL
  Filled 2022-05-04 (×2): qty 1

## 2022-05-04 MED ORDER — QUETIAPINE FUMARATE 50 MG PO TABS
50.0000 mg | ORAL_TABLET | Freq: Every day | ORAL | Status: DC
  Administered 2022-05-04 – 2022-05-12 (×9): 50 mg via ORAL
  Filled 2022-05-04 (×9): qty 1

## 2022-05-04 NOTE — Progress Notes (Signed)
Still having behavioral issues particularly at night.  Remains irritable with feelings of persecution and dissatisfaction with his care.  No obvious seizure activity.  Awake and alert.  Oriented.  Speech is fluent.  Judgment and insight are somewhat clouded.  Cranial nerve function normal bilaterally.  Motor examination 5/5 right upper and right lower extremity.  Good motor strength in his left hand and intrinsics.  He has 4-/5 strength in his wrist extensors, triceps and biceps muscle group.  He still has no deltoid strength.  Left lower extremity strength 3 to 4-/5.  Patient with ongoing personality issues likely at least in part due to his frontotemporal tumor on the left side with surrounding edema.  Patient has not made much change with medication adjustments.  We will try to take him off steroids and see if this makes any difference.  We will increase his Seroquel.  I discussed with the patient and the family with regard to movement or to skilled nursing facility.

## 2022-05-04 NOTE — Progress Notes (Signed)
Initial Nutrition Assessment  DOCUMENTATION CODES:   Not applicable  INTERVENTION:   Discontinue Ensure Provided chopped meats with all trays Meal ordering with assist Feeding assist with all meals If constipation continue would recommend increasing bowel regimen.  NUTRITION DIAGNOSIS:   Inadequate oral intake related to chronic illness as evidenced by meal completion < 50%.  GOAL:   Patient will meet greater than or equal to 90% of their needs  MONITOR:   PO intake, Labs, I & O's, Weight trends  REASON FOR ASSESSMENT:   Consult Assessment of nutrition requirement/status  ASSESSMENT:   Pt admitted from home with acute metabolic encephalopathy likely d/t frontal edema versus steroid use . PMH significant for meningioma s/p resection, L hemiparesis, constipation, T2DM, and seizures.   Pt reports that he has not been eating well due to difficulty feeding self and not liking food options. Pt reports that his appetite was good at home.  Pt reports that he has had issues with constipation since the 1990's and uses milk of magnesia multiple times per week. Pt states that he is currently constipated and is uncomfortable and not eating well also due to that. Reports that he has not been drinking the Ensure's. Pt provided RD with some preferences and RD to adjust in system.   Medications reviewed and include: Colace, NovoLog, Protonix, MVI, Senokot Labs reviewed.  Diet Order:   Diet Order             Diet regular Room service appropriate? Yes; Fluid consistency: Thin  Diet effective now                   EDUCATION NEEDS:   No education needs have been identified at this time  Skin:  Skin Assessment: Reviewed RN Assessment (L buttock skin tear)  Last BM:  7/9  Height:   Ht Readings from Last 1 Encounters:  04/27/22 6' (1.829 m)    Weight:   Wt Readings from Last 1 Encounters:  04/27/22 90.7 kg    BMI:  There is no height or weight on file to calculate  BMI.  Estimated Nutritional Needs:   Kcal:  2200-2400  Protein:  110-125g  Fluid:  >2.2L    Hermina Barters RD, LDN Clinical Dietitian See [facility] for contact information.

## 2022-05-04 NOTE — Progress Notes (Signed)
Occupational Therapy Treatment Patient Details Name: Patrick Buchanan MRN: [id number] DOB: 1951-07-08 Today's Date: 05/04/2022   History of present illness Pt is a 71 y/o male presented to ED on 04/27/22 for increased agitation, aggression, and suicidal ideation. PMH: hx of meningioma s/p resection 02/2022 with L hemiparesis, seizures, T2DM, depression   OT comments  Discussed with COTA - pt with L wrist cock up splint, which on entry today was only on pt's forearm, not on his wrist. He states it was "uncomfortable and poking him in the hand". Pt with apparent neuropraxia L UE, increasing his discomfort with the wrist cock up splint due to the position of the metal stay due to abnormal tone and pushing into wrist flexion at times. Attempted to adjust splint. Recommend pt wear the splint PRN and through the night if tolerated. Pt may need custom wrist cock-up splint fabricated to improve fit and tolerance. Agree with recommendation for SNF for rehab. Will continue to follow.    Recommendations for follow up therapy are one component of a multi-disciplinary discharge planning process, led by the attending physician.  Recommendations may be updated based on patient status, additional functional criteria and insurance authorization.    Follow Up Recommendations  Skilled nursing-short term rehab (<3 hours/day)    Assistance Recommended at Discharge Frequent or constant Supervision/Assistance  Patient can return home with the following  A lot of help with walking and/or transfers;A lot of help with bathing/dressing/bathroom;Assistance with feeding;Assistance with cooking/housework;Direct supervision/assist for medications management;Direct supervision/assist for financial management;Assist for transportation;Help with stairs or ramp for entrance   Equipment Recommendations  None recommended by OT    Recommendations for Other Services      Precautions / Restrictions Precautions Precautions:  Fall Precaution Comments: neuropraxic LUE Restrictions Weight Bearing Restrictions: No       Mobility Bed Mobility                    Transfers                         Balance                                           ADL either performed or assessed with clinical judgement   ADL                                              Extremity/Trunk Assessment Upper Extremity Assessment LUE Deficits / Details: appanert neuropraxic LUE; moving wrist/hand, movemetn uncorrdianted at times; compkainin gof pain in palm where metal stay of splint pushes inot palm due to abnormal tone LUE Coordination: decreased fine motor;decreased gross motor   Lower Extremity Assessment Lower Extremity Assessment: Defer to PT evaluation        Vision       Perception     Praxis      Cognition Arousal/Alertness: Awake/alert Behavior During Therapy: Flat affect, Anxious Overall Cognitive Status: Impaired/Different from baseline                                          Exercises Other Exercises Other Exercises: A/AA/PROM  L hand/wrist    Shoulder Instructions       General Comments Pt seen to assess LUE splinting needs    Pertinent Vitals/ Pain       Pain Assessment Pain Assessment: Faces Faces Pain Scale: Hurts little more Pain Location: L LE Pain Descriptors / Indicators: Guarding, Discomfort Pain Intervention(s): Limited activity within patient's tolerance  Home Living                                          Prior Functioning/Environment              Frequency  Min 2X/week        Progress Toward Goals  OT Goals(current goals can now be found in the care plan section)  Progress towards OT goals: Progressing toward goals  Acute Rehab OT Goals Patient Stated Goal: to get better OT Goal Formulation: With patient Time For Goal Achievement: 05/13/22 Potential to Achieve  Goals: Good ADL Goals Pt Will Perform Eating: with set-up;sitting Pt Will Perform Grooming: with set-up;sitting Pt/caregiver will Perform Home Exercise Program: Increased ROM;Increased strength;Left upper extremity;Independently;With written HEP provided Additional ADL Goal #1: Pt will complete bed mobility with supervision as a precursor to seated ADL's.  Plan Frequency needs to be updated;Discharge plan remains appropriate    Co-evaluation                 AM-PAC OT "6 Clicks" Daily Activity     Outcome Measure   Help from another person eating meals?: A Little Help from another person taking care of personal grooming?: A Little Help from another person toileting, which includes using toliet, bedpan, or urinal?: A Lot Help from another person bathing (including washing, rinsing, drying)?: A Lot Help from another person to put on and taking off regular upper body clothing?: A Lot Help from another person to put on and taking off regular lower body clothing?: A Lot 6 Click Score: 14    End of Session    OT Visit Diagnosis: Other abnormalities of gait and mobility (R26.89);Muscle weakness (generalized) (M62.81);Hemiplegia and hemiparesis Hemiplegia - Right/Left: Left Hemiplegia - dominant/non-dominant: Non-Dominant Hemiplegia - caused by: Unspecified   Activity Tolerance Patient tolerated treatment well   Patient Left in bed;with call bell/phone within reach;with family/visitor present   Nurse Communication Other (comment) (splint use)        Time: 2458-0998 OT Time Calculation (min): 20 min  Charges: OT General Charges $OT Visit: 1 Visit OT Treatments $Therapeutic Activity: 8-22 mins  Maurie Boettcher, OT/L   Acute OT Clinical Specialist [place] Pager [phone] Office [phone]   [facility] 05/04/2022, 11:38 AM

## 2022-05-04 NOTE — TOC Progression Note (Signed)
Transition of Care [facility]) - Progression Note    Patient Details  Name: Patrick Buchanan MRN: [id number] Date of Birth: [birth year]  Transition of Care [facility]) CM/SW [place], LCSW Phone Number: 05/04/2022, 11:23 AM  Clinical Narrative:    CSW met with Dr. Trenton Gammon, patient, and his daughters. Patient reported agreement with SNF rehab short term and agreed to have the facility come speak with him. CSW will submit for pasrr and also requested updated responses from the SNFs that have not responded to referral.    Expected Discharge Plan: Skilled Nursing Facility Barriers to Discharge: [place] [facility]), SNF Pending bed offer, Insurance Authorization  Expected Discharge Plan and Services Expected Discharge Plan: [place] In-house Referral: Clinical Social Work Discharge Planning Services: CM Consult Post Acute Care Choice: [place] Living arrangements for the past 2 months: Single Family Home                           HH Arranged: OT, PT HH Agency: [facility] Date [place]: 04/28/22 Time [place]: 58 Representative spoke with at [place]: Marjory Lies- active with [facility]. they are aware he is here in hospital   Social Determinants of Health (SDOH) Interventions    Readmission Risk Interventions     No data to display

## 2022-05-04 NOTE — Progress Notes (Signed)
Resection of                        PROGRESS NOTE        PATIENT DETAILS Name: Patrick Buchanan Age: 71 y.o. Sex: male Date of Birth: [birth year] Admit Date: 04/27/2022 Admitting Physician Nita Sells, MD QIH:[id number], [name], PA-C  Brief Summary: Patient is a 71 y.o.  male who is s/p status is after meningioma-with resultant left-sided hemiparesis-requiring 24/7 care by family/aides at home-presented to the hospital for bizarre behavior-he was subsequently admitted to the hospitalist service-see below for further details.  Significant events: 7/6>> admit to [facility] for personality changes/encephalopathy.  Significant studies: 7/6>> CT head: Postop changes in the right parietal convexity-rim-enhancing fluid collection containing gas-suspicious for abscess.  Numerous residual meningiomas are noted bilaterally with edema in the left frontal lobe/left parietal lobe. 7/6>> NH 4: Normal limit 7/7>> TSH: Within normal limit 7/8>> Spot EEG: No seizures 7/10>> x-ray left knee: No acute findings.  Significant microbiology data: 7/6>> urine culture: Aerococcus  Procedures: None  Consults: Neurology Neurosurgery Psychiatry  Subjective: Lying comfortably in bed-denies any chest pain or shortness of breath.  Objective: Vitals: Blood pressure 134/70, pulse (!) 59, temperature 97.9 F (36.6 C), temperature source Axillary, resp. rate 13, SpO2 98 %.   Exam: Gen Exam:Alert awake-not in any distress HEENT:atraumatic, normocephalic Chest: B/L clear to auscultation anteriorly CVS:S1S2 regular Abdomen:soft non tender, non distended Extremities:no edema Neurology: Left-sided hemiplegia. Skin: no rash  Pertinent Labs/Radiology:    Latest Ref Rng & Units 05/03/2022    2:51 AM 05/01/2022   12:49 AM 04/29/2022    9:26 AM  CBC  WBC 4.0 - 10.5 K/uL 7.6  8.4  6.9   Hemoglobin 13.0 - 17.0 g/dL 11.5  11.0  11.5   Hematocrit 39.0 - 52.0 % 35.1  32.9  35.4   Platelets 150 - 400 K/uL  336  326  348     Lab Results  Component Value Date   NA 140 05/03/2022   K 3.9 05/03/2022   CL 106 05/03/2022   CO2 24 05/03/2022     Assessment/Plan: Acute metabolic encephalopathy/personality changes: After extensive work-up-felt to be due to residual meningiomas/steroid use/possibly Keppra (now changed to Vimpat).  He is completely awake/alert at this point.  EEG was negative for seizures.  History of recent craniotomy with resection of large parasagittal meningioma-s/p left sided weakness-CT imaging continues to show vasogenic edema: Remains on steroids-Per neurosurgery-no evidence of abscess (ruled out).  Left-sided hemiplegia is stable/unchanged.  No longer on Keppra due to cognitive issues-has been switched to Vimpat.  History of urinary retention: Had Foley catheter as an outpatient which has been removed-patient voiding spontaneously-on Flomax.  DM-2 (A1c 5.7 on 5/3): CBG stable-continue SSI.  Recent Labs    05/03/22 1538 05/03/22 1826 05/04/22 0815  GLUCAP 142* 129* 96     History of depression: Continue Seroquel/trazodone.  Apparently on admission-had possible suicidal intent-evaluated by psychiatry-and felt not to be an imminent danger to self/others.  Not felt to require inpatient psych admission.  History of constipation: Continue MOM.  Disposition issues: Lives alone-has a aide 24/7-given that he is bedbound/personality issues-family feels that they will not be able to manage him at home, extensive discussions in progress regarding possible SNF placement.  Nutrition Status: Nutrition Problem: Inadequate oral intake Etiology: chronic illness Signs/Symptoms: meal completion < 50% Interventions: Ensure Enlive (each supplement provides 350kcal and 20 grams of protein), Liberalize Diet, MVI  BMI: Estimated body  mass index is 27.12 kg/m as calculated from the following:   Height as of an earlier encounter on 04/27/22: 6' (1.829 m).   Weight as of an earlier  encounter on 04/27/22: 90.7 kg.   Code status:   Code Status: Full Code   DVT Prophylaxis: SCDs Start: 04/27/22 2254   Family Communication: 2 daughters at bedside.   Disposition Plan: Status is: Inpatient Remains inpatient appropriate because: Personality changes/encephalopathy due to residual meningioma-see above.   Planned Discharge Destination:Skilled nursing facility   Diet: Diet Order             Diet regular Room service appropriate? Yes; Fluid consistency: Thin  Diet effective now                     Antimicrobial agents: Anti-infectives (From admission, onward)    None        MEDICATIONS: Scheduled Meds:  baclofen  10 mg Oral TID   ciprofloxacin  1 drop Right Eye Q4H while awake   docusate sodium  100 mg Oral BID   escitalopram  20 mg Oral Daily   gabapentin  100 mg Oral BID   insulin aspart  0-9 Units Subcutaneous TID WC   lamoTRIgine  100 mg Oral BID   multivitamin with minerals  1 tablet Oral Daily   pantoprazole  40 mg Oral QHS   QUEtiapine  50 mg Oral QHS   rosuvastatin  20 mg Oral Daily   senna-docusate  2 tablet Oral QHS   tamsulosin  0.4 mg Oral QPC supper   Continuous Infusions:  sodium chloride 75 mL/hr at 05/04/22 0541   lacosamide (VIMPAT) IV 100 mg (05/04/22 1051)   PRN Meds:.acetaminophen **OR** acetaminophen, bisacodyl, HYDROcodone-acetaminophen, magnesium hydroxide, polyvinyl alcohol, sodium phosphate, traZODone   I have personally reviewed following labs and imaging studies  LABORATORY DATA: CBC: Recent Labs  Lab 04/27/22 1701 04/28/22 0546 04/29/22 0926 05/01/22 0049 05/03/22 0251  WBC 8.2 8.4 6.9 8.4 7.6  NEUTROABS 6.4  --  6.2  --   --   HGB 11.9* 11.7* 11.5* 11.0* 11.5*  HCT 37.1* 37.1* 35.4* 32.9* 35.1*  MCV 91.6 92.8 90.1 88.2 88.2  PLT 345 366 348 326 106    Basic Metabolic Panel: Recent Labs  Lab 04/27/22 1701 04/28/22 0546 04/29/22 0926 05/01/22 0049 05/03/22 0251  NA 141 139 141 139 140  K  4.0 4.9 4.1 3.7 3.9  CL 106 105 108 104 106  CO2 '23 23 23 25 24  '$ GLUCOSE 79 93 120* 111* 122*  BUN '12 13 15 14 13  '$ CREATININE 0.67 0.60* 0.70 0.73 0.61  CALCIUM 9.1 9.1 9.3 9.1 8.8*  MG 1.9  --   --   --   --   PHOS 3.7  --   --   --   --     GFR: Estimated Creatinine Clearance: 95.7 mL/min (by C-G formula based on SCr of 0.61 mg/dL).  Liver Function Tests: Recent Labs  Lab 04/27/22 1701 04/28/22 0546 04/29/22 0926  AST 14* 27 13*  ALT '14 14 14  '$ ALKPHOS 81 79 79  BILITOT 0.8 1.3* 0.6  PROT 6.5 6.5 6.0*  ALBUMIN 3.5 3.3* 3.2*   No results for input(s): "LIPASE", "AMYLASE" in the last 168 hours. Recent Labs  Lab 04/27/22 2150  AMMONIA 19    Coagulation Profile: No results for input(s): "INR", "PROTIME" in the last 168 hours.  Cardiac Enzymes: No results for input(s): "CKTOTAL", "CKMB", "CKMBINDEX", "TROPONINI"  in the last 168 hours.  BNP (last 3 results) No results for input(s): "PROBNP" in the last 8760 hours.  Lipid Profile: No results for input(s): "CHOL", "HDL", "LDLCALC", "TRIG", "CHOLHDL", "LDLDIRECT" in the last 72 hours.  Thyroid Function Tests: No results for input(s): "TSH", "T4TOTAL", "FREET4", "T3FREE", "THYROIDAB" in the last 72 hours.  Anemia Panel: No results for input(s): "VITAMINB12", "FOLATE", "FERRITIN", "TIBC", "IRON", "RETICCTPCT" in the last 72 hours.  Urine analysis:    Component Value Date/Time   COLORURINE YELLOW 04/27/2022 1816   APPEARANCEUR CLOUDY (A) 04/27/2022 1816   LABSPEC 1.018 04/27/2022 1816   PHURINE 5.0 04/27/2022 1816   GLUCOSEU NEGATIVE 04/27/2022 1816   HGBUR MODERATE (A) 04/27/2022 1816   BILIRUBINUR NEGATIVE 04/27/2022 1816   KETONESUR 80 (A) 04/27/2022 1816   PROTEINUR 30 (A) 04/27/2022 1816   NITRITE NEGATIVE 04/27/2022 1816   LEUKOCYTESUR LARGE (A) 04/27/2022 1816    Sepsis Labs: Lactic Acid, Venous No results found for: "LATICACIDVEN"  MICROBIOLOGY: Recent Results (from the past 240 hour(s))   Culture, Urine (Do not remove urinary catheter, catheter placed by urology or difficult to place)     Status: Abnormal   Collection Time: 04/27/22  6:16 PM   Specimen: Urine, Catheterized  Result Value Ref Range Status   Specimen Description   Final    URINE, CATHETERIZED Performed at [facility], [place] [address] [place], [place] [address]    Special Requests   Final    NONE Performed at [facility], [place] [address], [place], [place] [address]    Culture (A)  Final    >=100,000 COLONIES/mL AEROCOCCUS URINAE Standardized susceptibility testing for this organism is not available. Performed at [facility], [place] [address], [place], [place] [address]    Report Status 04/30/2022 FINAL  Final    RADIOLOGY STUDIES/RESULTS: No results found.   LOS: 6 days   Oren Binet, MD  [facility]    To contact the attending provider between 7A-7P or the covering provider during after hours 7P-7A, please log into the web site [email or web address] and access using universal [place] password for that web site. If you do not have the password, please call the hospital operator.  05/04/2022, 11:36 AM

## 2022-05-04 NOTE — Plan of Care (Signed)
  Problem: Coping: Goal: Ability to adjust to condition or change in health will improve Outcome: Progressing   Problem: Health Behavior/Discharge Planning: Goal: Ability to identify and utilize available resources and services will improve Outcome: Progressing   Problem: Health Behavior/Discharge Planning: Goal: Ability to manage health-related needs will improve Outcome: Progressing   Problem: Skin Integrity: Goal: Risk for impaired skin integrity will decrease Outcome: Progressing   Problem: Coping: Goal: Level of anxiety will decrease Outcome: Progressing

## 2022-05-05 DIAGNOSIS — R4689 Other symptoms and signs involving appearance and behavior: Secondary | ICD-10-CM | POA: Diagnosis not present

## 2022-05-05 DIAGNOSIS — R338 Other retention of urine: Secondary | ICD-10-CM | POA: Diagnosis not present

## 2022-05-05 DIAGNOSIS — D329 Benign neoplasm of meninges, unspecified: Secondary | ICD-10-CM | POA: Diagnosis not present

## 2022-05-05 DIAGNOSIS — G9341 Metabolic encephalopathy: Secondary | ICD-10-CM | POA: Diagnosis not present

## 2022-05-05 LAB — GLUCOSE, CAPILLARY
Glucose-Capillary: 91 mg/dL (ref 70–99)
Glucose-Capillary: 129 mg/dL — ABNORMAL HIGH (ref 70–99)
Glucose-Capillary: 125 mg/dL — ABNORMAL HIGH (ref 70–99)

## 2022-05-05 MED ORDER — POLYETHYLENE GLYCOL 3350 17 G PO PACK
17.0000 g | PACK | Freq: Every day | ORAL | Status: DC
  Administered 2022-05-05 – 2022-05-07 (×3): 17 g via ORAL
  Filled 2022-05-05 (×3): qty 1

## 2022-05-05 MED ORDER — SIMETHICONE 80 MG PO CHEW
80.0000 mg | CHEWABLE_TABLET | Freq: Four times a day (QID) | ORAL | Status: AC
  Administered 2022-05-05 – 2022-05-07 (×8): 80 mg via ORAL
  Filled 2022-05-05 (×8): qty 1

## 2022-05-05 NOTE — Progress Notes (Addendum)
Resection of                        PROGRESS NOTE        PATIENT DETAILS Name: KANYON SEIBOLD Age: 71 y.o. Sex: male Date of Birth: 02/24/1951 Admit Date: 04/27/2022 Admitting Physician Nita Sells, MD HDQ:[id number], [name], PA-C  Brief Summary: Patient is a 71 y.o.  male who is s/p status is after meningioma-with resultant left-sided hemiparesis-requiring 24/7 care by family/aides at home-presented to the hospital for bizarre behavior-he was subsequently admitted to the hospitalist service-see below for further details.  Significant events: 7/6>> admit to [facility] for personality changes/encephalopathy.  Significant studies: 7/6>> CT head: Postop changes in the right parietal convexity-rim-enhancing fluid collection containing gas-suspicious for abscess.  Numerous residual meningiomas are noted bilaterally with edema in the left frontal lobe/left parietal lobe. 7/6>> NH 4: Normal limit 7/7>> TSH: Within normal limit 7/8>> Spot EEG: No seizures 7/10>> x-ray left knee: No acute findings.  Significant microbiology data: 7/6>> urine culture: Aerococcus  Procedures: None  Consults: Neurology Neurosurgery Psychiatry  Subjective: Lying comfortably in bed-no major issues overnight.  No family at bedside today.  He wants another Fleet enema today.  Objective: Vitals: Blood pressure 104/63, pulse 66, temperature 97.8 F (36.6 C), temperature source Oral, resp. rate 14, SpO2 95 %.   Exam: Gen Exam:Alert awake-not in any distress HEENT:atraumatic, normocephalic Chest: B/L clear to auscultation anteriorly CVS:S1S2 regular Abdomen:soft non tender, non distended Extremities:no edema Neurology: Left hemiplegia Skin: no rash   Pertinent Labs/Radiology:    Latest Ref Rng & Units 05/03/2022    2:51 AM 05/01/2022   12:49 AM 04/29/2022    9:26 AM  CBC  WBC 4.0 - 10.5 K/uL 7.6  8.4  6.9   Hemoglobin 13.0 - 17.0 g/dL 11.5  11.0  11.5   Hematocrit 39.0 - 52.0 % 35.1  32.9   35.4   Platelets 150 - 400 K/uL 336  326  348     Lab Results  Component Value Date   NA 140 05/03/2022   K 3.9 05/03/2022   CL 106 05/03/2022   CO2 24 05/03/2022     Assessment/Plan: Acute metabolic encephalopathy/personality changes: After extensive work-up-felt to be due to residual meningiomas/steroid use/possibly Keppra (now changed to Vimpat).  He is completely awake/alert at this point.  EEG was negative for seizures.  Neurosurgery does not feel that further surgery is going to be beneficial at this point-furthermore patient does not desire to pursue any further surgical intervention.  History of recent craniotomy with resection of large parasagittal meningioma-s/p left sided weakness-CT imaging continues to show vasogenic edema: Remains on steroids-Per neurosurgery-no evidence of abscess (ruled out).  Left-sided hemiplegia is stable/unchanged.  No longer on Keppra due to cognitive issues-has been switched to Vimpat.  History of urinary retention: Had Foley catheter as an outpatient which has been removed-patient voiding spontaneously-on Flomax.  DM-2 (A1c 5.7 on 5/3): CBG stable-continue SSI.  Recent Labs    05/04/22 2207 05/05/22 0821 05/05/22 1211  GLUCAP 116* 91 129*      History of depression: Continue Seroquel/trazodone.  Apparently on admission-had possible suicidal intent-evaluated by psychiatry-and felt not to be an imminent danger to self/others.  Not felt to require inpatient psych admission.  History of constipation: Continue MOM.  Disposition issues: Lives alone-has a aide 24/7-given that he is bedbound/personality issues-family feels that they will not be able to manage him at home, extensive discussions in progress regarding possible SNF  placement.  Nutrition Status: Nutrition Problem: Inadequate oral intake Etiology: chronic illness Signs/Symptoms: meal completion < 50% Interventions: MVI, Refer to RD note for recommendations  BMI: Estimated body mass  index is 27.12 kg/m as calculated from the following:   Height as of an earlier encounter on 04/27/22: 6' (1.829 m).   Weight as of an earlier encounter on 04/27/22: 90.7 kg.   Code status:   Code Status: Full Code   DVT Prophylaxis: SCDs Start: 04/27/22 2254   Family Communication: 2 daughters at bedside on 7/13-none at bedside today.   Disposition Plan: Status is: Inpatient Remains inpatient appropriate because: Personality changes/encephalopathy due to residual meningioma-see above.  Awaiting SNF placement.   Planned Discharge Destination:Skilled nursing facility   Diet: Diet Order             Diet regular Room service appropriate? Yes; Fluid consistency: Thin  Diet effective now                     Antimicrobial agents: Anti-infectives (From admission, onward)    None        MEDICATIONS: Scheduled Meds:  baclofen  10 mg Oral TID   ciprofloxacin  1 drop Right Eye Q4H while awake   docusate sodium  100 mg Oral BID   escitalopram  20 mg Oral Daily   gabapentin  100 mg Oral BID   insulin aspart  0-9 Units Subcutaneous TID WC   lacosamide  100 mg Oral BID   lamoTRIgine  100 mg Oral BID   multivitamin with minerals  1 tablet Oral Daily   pantoprazole  40 mg Oral QHS   polyethylene glycol  17 g Oral Daily   QUEtiapine  50 mg Oral QHS   rosuvastatin  20 mg Oral Daily   senna-docusate  2 tablet Oral QHS   tamsulosin  0.4 mg Oral QPC supper   Continuous Infusions:   PRN Meds:.acetaminophen **OR** acetaminophen, bisacodyl, HYDROcodone-acetaminophen, magnesium hydroxide, polyvinyl alcohol, sodium phosphate, traZODone   I have personally reviewed following labs and imaging studies  LABORATORY DATA: CBC: Recent Labs  Lab 04/29/22 0926 05/01/22 0049 05/03/22 0251  WBC 6.9 8.4 7.6  NEUTROABS 6.2  --   --   HGB 11.5* 11.0* 11.5*  HCT 35.4* 32.9* 35.1*  MCV 90.1 88.2 88.2  PLT 348 326 336     Basic Metabolic Panel: Recent Labs  Lab 04/29/22 0926  05/01/22 0049 05/03/22 0251  NA 141 139 140  K 4.1 3.7 3.9  CL 108 104 106  CO2 '23 25 24  '$ GLUCOSE 120* 111* 122*  BUN '15 14 13  '$ CREATININE 0.70 0.73 0.61  CALCIUM 9.3 9.1 8.8*     GFR: Estimated Creatinine Clearance: 95.7 mL/min (by C-G formula based on SCr of 0.61 mg/dL).  Liver Function Tests: Recent Labs  Lab 04/29/22 0926  AST 13*  ALT 14  ALKPHOS 79  BILITOT 0.6  PROT 6.0*  ALBUMIN 3.2*    No results for input(s): "LIPASE", "AMYLASE" in the last 168 hours. No results for input(s): "AMMONIA" in the last 168 hours.   Coagulation Profile: No results for input(s): "INR", "PROTIME" in the last 168 hours.  Cardiac Enzymes: No results for input(s): "CKTOTAL", "CKMB", "CKMBINDEX", "TROPONINI" in the last 168 hours.  BNP (last 3 results) No results for input(s): "PROBNP" in the last 8760 hours.  Lipid Profile: No results for input(s): "CHOL", "HDL", "LDLCALC", "TRIG", "CHOLHDL", "LDLDIRECT" in the last 72 hours.  Thyroid Function Tests: No  results for input(s): "TSH", "T4TOTAL", "FREET4", "T3FREE", "THYROIDAB" in the last 72 hours.  Anemia Panel: No results for input(s): "VITAMINB12", "FOLATE", "FERRITIN", "TIBC", "IRON", "RETICCTPCT" in the last 72 hours.  Urine analysis:    Component Value Date/Time   COLORURINE YELLOW 04/27/2022 1816   APPEARANCEUR CLOUDY (A) 04/27/2022 1816   LABSPEC 1.018 04/27/2022 1816   PHURINE 5.0 04/27/2022 1816   GLUCOSEU NEGATIVE 04/27/2022 1816   HGBUR MODERATE (A) 04/27/2022 1816   BILIRUBINUR NEGATIVE 04/27/2022 1816   KETONESUR 80 (A) 04/27/2022 1816   PROTEINUR 30 (A) 04/27/2022 1816   NITRITE NEGATIVE 04/27/2022 1816   LEUKOCYTESUR LARGE (A) 04/27/2022 1816    Sepsis Labs: Lactic Acid, Venous No results found for: "LATICACIDVEN"  MICROBIOLOGY: Recent Results (from the past 240 hour(s))  Culture, Urine (Do not remove urinary catheter, catheter placed by urology or difficult to place)     Status: Abnormal    Collection Time: 04/27/22  6:16 PM   Specimen: Urine, Catheterized  Result Value Ref Range Status   Specimen Description   Final    URINE, CATHETERIZED Performed at [facility], [place] [address], [place], [place] [address]    Special Requests   Final    NONE Performed at [facility], [place] [address], [place], [place] [address]    Culture (A)  Final    >=100,000 COLONIES/mL AEROCOCCUS URINAE Standardized susceptibility testing for this organism is not available. Performed at [facility], [place] [address] [place], [place] [address]    Report Status 04/30/2022 FINAL  Final    RADIOLOGY STUDIES/RESULTS: No results found.   LOS: 7 days   Oren Binet, MD  [facility]    To contact the attending provider between 7A-7P or the covering provider during after hours 7P-7A, please log into the web site [email or web address] and access using universal [place] password for that web site. If you do not have the password, please call the hospital operator.  05/05/2022, 2:00 PM

## 2022-05-05 NOTE — TOC Progression Note (Addendum)
Transition of Care [facility]) - Progression Note    Patient Details  Name: SAHIB PELLA MRN: [id number] Date of Birth: February 13, 1951  Transition of Care [facility] CM/SW [place], [place] Phone Number: 05/05/2022, 9:37 AM  Clinical Narrative:    [place] coming to speak with patient today at 12pm. CSW updated patient's daughter, [name]. Pasrr pending and requires in-person evaluation.   12:40pm-[facility] able to accept patient. They are locating a private room for him on their front hallway for easy access for staff.   2:30pm-[facility] has a private room. CSW beginning insurance authorization process, Ref# [id number]. Pasrr still pending. Daughter updated.    Expected Discharge Plan: Skilled Nursing Facility Barriers to Discharge: [place] [facility]), SNF Pending bed offer, Insurance Authorization  Expected Discharge Plan and Services Expected Discharge Plan: [place] In-house Referral: Clinical Social Work Discharge Planning Services: CM Consult Post Acute Care Choice: [place] Living arrangements for the past 2 months: [place]: OT, PT HH Agency: [facility] Date [place]: 04/28/22 Time [place]: 8719 Representative spoke with at [place]: Marjory Lies- active with [facility]. they are aware he is here in hospital   Social Determinants of Health (SDOH) Interventions    Readmission Risk Interventions     No data to display

## 2022-05-06 DIAGNOSIS — G9341 Metabolic encephalopathy: Secondary | ICD-10-CM | POA: Diagnosis not present

## 2022-05-06 LAB — GLUCOSE, CAPILLARY
Glucose-Capillary: 90 mg/dL (ref 70–99)
Glucose-Capillary: 113 mg/dL — ABNORMAL HIGH (ref 70–99)
Glucose-Capillary: 115 mg/dL — ABNORMAL HIGH (ref 70–99)
Glucose-Capillary: 118 mg/dL — ABNORMAL HIGH (ref 70–99)
Glucose-Capillary: 107 mg/dL — ABNORMAL HIGH (ref 70–99)

## 2022-05-06 NOTE — Progress Notes (Signed)
Resection of                        PROGRESS NOTE        PATIENT DETAILS Name: Patrick Buchanan Age: 71 y.o. Sex: male Date of Birth: [birth year] Admit Date: 04/27/2022 Admitting Physician Nita Sells, MD PXT:[id number], [name], PA-C  Brief Summary: Patient is a 71 y.o.  male who is s/p status is after meningioma-with resultant left-sided hemiparesis-requiring 24/7 care by family/aides at home-presented to the hospital for bizarre behavior-he was subsequently admitted to the hospitalist service-see below for further details.  Significant events: 7/6>> admit to [facility] for personality changes/encephalopathy.  Significant studies: 7/6>> CT head: Postop changes in the right parietal convexity-rim-enhancing fluid collection containing gas-suspicious for abscess.  Numerous residual meningiomas are noted bilaterally with edema in the left frontal lobe/left parietal lobe. 7/6>> NH 4: Normal limit 7/7>> TSH: Within normal limit 7/8>> Spot EEG: No seizures 7/10>> x-ray left knee: No acute findings.  Significant microbiology data: 7/6>> urine culture: Aerococcus  Procedures: None  Consults: Neurology Neurosurgery Psychiatry  Subjective:   Patient in bed, appears comfortable, denies any headache, no fever, no chest pain or pressure, no shortness of breath , no abdominal pain. No new focal weakness.   Objective: Vitals: Blood pressure 112/76, pulse 65, temperature 99.2 F (37.3 C), temperature source Oral, resp. rate 14, SpO2 98 %.   Exam:  Awake Alert, No new F.N deficits, Normal affect Lane.AT,PERRAL Supple Neck, No JVD,   Symmetrical Chest wall movement, Good air movement bilaterally, CTAB RRR,No Gallops, Rubs or new Murmurs,  +ve B.Sounds, Abd Soft, No tenderness,   No Cyanosis, Clubbing or edema   Assessment/Plan:  Acute metabolic encephalopathy/personality changes: After extensive work-up-felt to be due to residual meningiomas/steroid use/possibly Keppra (now changed  to Vimpat).  He is completely awake/alert at this point.  EEG was negative for seizures.  Neurosurgery does not feel that further surgery is going to be beneficial at this point-furthermore patient does not desire to pursue any further surgical intervention.  History of recent craniotomy with resection of large parasagittal meningioma-s/p left sided weakness-CT imaging continues to show vasogenic edema: Remains on steroids-Per neurosurgery-no evidence of abscess (ruled out).  Left-sided hemiplegia is stable/unchanged.  No longer on Keppra due to cognitive issues-has been switched to Vimpat.  History of urinary retention: Had Foley catheter as an outpatient which has been removed-patient voiding spontaneously-on Flomax.  History of depression: Continue Seroquel/trazodone.  Apparently on admission-had possible suicidal intent-evaluated by psychiatry-and felt not to be an imminent danger to self/others.  Not felt to require inpatient psych admission.  History of constipation: Continue MOM.  Disposition issues: Lives alone-has a aide 24/7-given that he is bedbound/personality issues-family feels that they will not be able to manage him at home, extensive discussions in progress regarding possible SNF placement.  DM-2 (A1c 5.7 on 5/3): CBG stable-continue SSI.  CBG (last 3)  Recent Labs    05/05/22 1616 05/06/22 0758 05/06/22 1206  GLUCAP 125* 90 113*    Nutrition Status: Nutrition Problem: Inadequate oral intake Etiology: chronic illness Signs/Symptoms: meal completion < 50% Interventions: MVI, Refer to RD note for recommendations  BMI: Estimated body mass index is 27.12 kg/m as calculated from the following:   Height as of an earlier encounter on 04/27/22: 6' (1.829 m).   Weight as of an earlier encounter on 04/27/22: 90.7 kg.   Code status:   Code Status: Full Code   DVT Prophylaxis: SCDs Start: 04/27/22  2254   Family Communication: 2 daughters at bedside on 7/13-none at bedside  today.   Disposition Plan: Status is: Inpatient Remains inpatient appropriate because: Personality changes/encephalopathy due to residual meningioma-see above.  Awaiting SNF placement.   Planned Discharge Destination:Skilled nursing facility   Diet: Diet Order             Diet regular Room service appropriate? Yes; Fluid consistency: Thin  Diet effective now                     Antimicrobial agents: Anti-infectives (From admission, onward)    None        MEDICATIONS: Scheduled Meds:  baclofen  10 mg Oral TID   ciprofloxacin  1 drop Right Eye Q4H while awake   docusate sodium  100 mg Oral BID   escitalopram  20 mg Oral Daily   gabapentin  100 mg Oral BID   insulin aspart  0-9 Units Subcutaneous TID WC   lacosamide  100 mg Oral BID   lamoTRIgine  100 mg Oral BID   multivitamin with minerals  1 tablet Oral Daily   pantoprazole  40 mg Oral QHS   polyethylene glycol  17 g Oral Daily   QUEtiapine  50 mg Oral QHS   rosuvastatin  20 mg Oral Daily   senna-docusate  2 tablet Oral QHS   simethicone  80 mg Oral QID   tamsulosin  0.4 mg Oral QPC supper   Continuous Infusions:   PRN Meds:.acetaminophen **OR** acetaminophen, bisacodyl, HYDROcodone-acetaminophen, magnesium hydroxide, polyvinyl alcohol, sodium phosphate, traZODone   I have personally reviewed following labs and imaging studies  LABORATORY DATA:  Recent Labs  Lab 05/01/22 0049 05/03/22 0251  WBC 8.4 7.6  HGB 11.0* 11.5*  HCT 32.9* 35.1*  PLT 326 336  MCV 88.2 88.2  MCH 29.5 28.9  MCHC 33.4 32.8  RDW 14.0 13.8    Recent Labs  Lab 05/01/22 0049 05/03/22 0251  NA 139 140  K 3.7 3.9  CL 104 106  CO2 25 24  GLUCOSE 111* 122*  BUN 14 13  CREATININE 0.73 0.61  CALCIUM 9.1 8.8*   Signature  Lala Lund M.D on 05/06/2022 at 12:37 PM   -  To page go to [email or web address]

## 2022-05-07 ENCOUNTER — Inpatient Hospital Stay (HOSPITAL_COMMUNITY): Payer: Medicare HMO

## 2022-05-07 DIAGNOSIS — G9341 Metabolic encephalopathy: Secondary | ICD-10-CM | POA: Diagnosis not present

## 2022-05-07 LAB — CBC WITH DIFFERENTIAL/PLATELET
Abs Immature Granulocytes: 0.04 10*3/uL (ref 0.00–0.07)
Basophils Absolute: 0 10*3/uL (ref 0.0–0.1)
Basophils Relative: 0 %
Eosinophils Absolute: 0.2 10*3/uL (ref 0.0–0.5)
Eosinophils Relative: 2 %
HCT: 36.4 % — ABNORMAL LOW (ref 39.0–52.0)
Hemoglobin: 11.6 g/dL — ABNORMAL LOW (ref 13.0–17.0)
Immature Granulocytes: 0 %
Lymphocytes Relative: 15 %
Lymphs Abs: 1.3 10*3/uL (ref 0.7–4.0)
MCH: 28.7 pg (ref 26.0–34.0)
MCHC: 31.9 g/dL (ref 30.0–36.0)
MCV: 90.1 fL (ref 80.0–100.0)
Monocytes Absolute: 0.8 10*3/uL (ref 0.1–1.0)
Monocytes Relative: 9 %
Neutro Abs: 6.7 10*3/uL (ref 1.7–7.7)
Neutrophils Relative %: 74 %
Platelets: 310 10*3/uL (ref 150–400)
RBC: 4.04 MIL/uL — ABNORMAL LOW (ref 4.22–5.81)
RDW: 14.2 % (ref 11.5–15.5)
WBC: 9 10*3/uL (ref 4.0–10.5)
nRBC: 0 % (ref 0.0–0.2)

## 2022-05-07 LAB — BASIC METABOLIC PANEL
Anion gap: 13 (ref 5–15)
BUN: 14 mg/dL (ref 8–23)
CO2: 25 mmol/L (ref 22–32)
Calcium: 9 mg/dL (ref 8.9–10.3)
Chloride: 104 mmol/L (ref 98–111)
Creatinine, Ser: 0.82 mg/dL (ref 0.61–1.24)
GFR, Estimated: >60 mL/min (ref >60)
Glucose, Bld: 105 mg/dL — ABNORMAL HIGH (ref 70–99)
Potassium: 3.9 mmol/L (ref 3.5–5.1)
Sodium: 142 mmol/L (ref 135–145)

## 2022-05-07 LAB — GLUCOSE, CAPILLARY
Glucose-Capillary: 86 mg/dL (ref 70–99)
Glucose-Capillary: 96 mg/dL (ref 70–99)
Glucose-Capillary: 102 mg/dL — ABNORMAL HIGH (ref 70–99)
Glucose-Capillary: 148 mg/dL — ABNORMAL HIGH (ref 70–99)
Glucose-Capillary: 126 mg/dL — ABNORMAL HIGH (ref 70–99)

## 2022-05-07 LAB — MAGNESIUM: Magnesium: 2.4 mg/dL (ref 1.7–2.4)

## 2022-05-07 MED ORDER — BISACODYL 5 MG PO TBEC
10.0000 mg | DELAYED_RELEASE_TABLET | Freq: Every day | ORAL | Status: DC | PRN
  Filled 2022-05-07: qty 2

## 2022-05-07 MED ORDER — DOCUSATE SODIUM 100 MG PO CAPS
200.0000 mg | ORAL_CAPSULE | Freq: Two times a day (BID) | ORAL | Status: DC
  Administered 2022-05-07 – 2022-05-13 (×13): 200 mg via ORAL
  Filled 2022-05-07 (×13): qty 2

## 2022-05-07 MED ORDER — LACTULOSE 10 GM/15ML PO SOLN
30.0000 g | Freq: Two times a day (BID) | ORAL | Status: DC | PRN
  Administered 2022-05-07: 30 g via ORAL
  Filled 2022-05-07: qty 45

## 2022-05-07 NOTE — Progress Notes (Signed)
Occupational Therapy Treatment Patient Details Name: Patrick Buchanan MRN: [id number] DOB: [birth year] Today's Date: 05/07/2022   History of present illness Pt is a 71 y/o male presented to ED on 04/27/22 for increased agitation, aggression, and suicidal ideation. PMH: hx of meningioma s/p resection 02/2022 with L hemiparesis, seizures, T2DM, depression   OT comments  This 71 yo male admitted with above seen today to address issues he was having with his left pre-fabricated wrist cockup splint per secure chat from PT. Padded splint in several places and pt reported that it did indeed feel better. We will continue to follow.   Recommendations for follow up therapy are one component of a multi-disciplinary discharge planning process, led by the attending physician.  Recommendations may be updated based on patient status, additional functional criteria and insurance authorization.    Follow Up Recommendations  Skilled nursing-short term rehab (<3 hours/day)    Assistance Recommended at Discharge Frequent or constant Supervision/Assistance  Patient can return home with the following  A lot of help with walking and/or transfers;A lot of help with bathing/dressing/bathroom;Assistance with feeding;Assistance with cooking/housework;Direct supervision/assist for medications management;Direct supervision/assist for financial management;Assist for transportation;Help with stairs or ramp for entrance   Equipment Recommendations  None recommended by OT       Precautions / Restrictions Precautions Precautions: Fall Precaution Comments: neuropraxic LUE Restrictions Weight Bearing Restrictions: No                 Extremity/Trunk Assessment Upper Extremity Assessment LUE Deficits / Details: Neuropraxix LUE (he does not always know what position his hand is in, he can isolate finger and wrist movements when asked to, still c/o pain where metal stay of splint is in his palm ( bent it back away  from palm slightly and padded with mepilex) also padded webspace piece and distal end of metal stay that is on the dorsum of his arm with mepilex as well. He also mentioned that it was rubbing at the ulnar styloid--so padded this with mepilex too. LUE Coordination: decreased fine motor;decreased gross motor             Cognition Arousal/Alertness: Awake/alert Behavior During Therapy: Flat affect Overall Cognitive Status: Impaired/Different from baseline                                                     Pertinent Vitals/ Pain       Pain Assessment Pain Assessment: No/denies pain         Frequency  Min 2X/week        Progress Toward Goals  OT Goals(current goals can now be found in the care plan section)  Progress towards OT goals: Progressing toward goals  Acute Rehab OT Goals Patient Stated Goal: to get a splint that fits better OT Goal Formulation: With patient Time For Goal Achievement: 05/13/22 Potential to Achieve Goals: Good  Plan Discharge plan remains appropriate;Frequency remains appropriate       AM-PAC OT "6 Clicks" Daily Activity     Outcome Measure   Help from another person eating meals?: A Little Help from another person taking care of personal grooming?: A Little Help from another person toileting, which includes using toliet, bedpan, or urinal?: A Lot Help from another person bathing (including washing, rinsing, drying)?: A Lot Help from another person to put on and  taking off regular upper body clothing?: A Lot Help from another person to put on and taking off regular lower body clothing?: A Lot 6 Click Score: 14    End of Session    OT Visit Diagnosis: Other abnormalities of gait and mobility (R26.89);Muscle weakness (generalized) (M62.81);Hemiplegia and hemiparesis Hemiplegia - Right/Left: Left Hemiplegia - dominant/non-dominant: Non-Dominant Hemiplegia - caused by: Unspecified   Activity Tolerance Patient  tolerated treatment well   Patient Left in bed;with call bell/phone within reach   Nurse Communication  (splint fixed for now, will ask another OT to see him tomorrow to see if we do need to do a custom splint)        Time: 1353-1420 OT Time Calculation (min): 27 min  Charges: OT General Charges $OT Visit: 1 Visit OT Treatments $Orthotics Fit/Training: 23-37 mins  Patrick Buchanan, OTR/L Acute Rehab Services [facility] [phone] Office [phone]    Patrick Buchanan 05/07/2022, 3:00 PM

## 2022-05-07 NOTE — Progress Notes (Signed)
Resection of                        PROGRESS NOTE        PATIENT DETAILS Name: Patrick Buchanan Age: 71 y.o. Sex: male Date of Birth: 09-16-1951 Admit Date: 04/27/2022 Admitting Physician Nita Sells, MD MOQ:[id number], [name], PA-C  Brief Summary: Patient is a 71 y.o.  male who is s/p status is after meningioma-with resultant left-sided hemiparesis-requiring 24/7 care by family/aides at home-presented to the hospital for bizarre behavior-he was subsequently admitted to the hospitalist service-see below for further details.  Significant events: 7/6>> admit to [facility] for personality changes/encephalopathy.  Significant studies: 7/6>> CT head: Postop changes in the right parietal convexity-rim-enhancing fluid collection containing gas-suspicious for abscess.  Numerous residual meningiomas are noted bilaterally with edema in the left frontal lobe/left parietal lobe. 7/6>> NH 4: Normal limit 7/7>> TSH: Within normal limit 7/8>> Spot EEG: No seizures 7/10>> x-ray left knee: No acute findings.  Significant microbiology data: 7/6>> urine culture: Aerococcus  Procedures: None  Consults: Neurology Neurosurgery Psychiatry  Subjective:   Patient in bed, appears comfortable, denies any headache, no fever, no chest pain or pressure, no shortness of breath , no abdominal pain. No new focal weakness.    Objective: Vitals: Blood pressure 118/77, pulse 71, temperature 97.8 F (36.6 C), temperature source Oral, resp. rate 17, SpO2 92 %.   Exam:  Awake Alert, No new F.N deficits, continues to have left-sided hemiparesis [place].AT,PERRAL Supple Neck, No JVD,   Symmetrical Chest wall movement, Good air movement bilaterally, CTAB RRR,No Gallops, Rubs or new Murmurs,  +ve B.Sounds, Abd Soft, No tenderness,   No Cyanosis, Clubbing or edema    Assessment/Plan:  Acute metabolic encephalopathy/personality changes: After extensive work-up-felt to be due to residual meningiomas/steroid  use/possibly Keppra (now changed to Vimpat).  He is completely awake/alert at this point.  EEG was negative for seizures.  Neurosurgery does not feel that further surgery is going to be beneficial at this point-furthermore patient does not desire to pursue any further surgical intervention.  History of recent craniotomy with resection of large parasagittal meningioma-s/p left sided weakness-CT imaging continues to show vasogenic edema: Remains on steroids-Per neurosurgery-no evidence of abscess (ruled out).  Left-sided hemiplegia is stable/unchanged.  No longer on Keppra due to cognitive issues-has been switched to Vimpat.  History of urinary retention: Had Foley catheter as an outpatient which has been removed-patient voiding spontaneously-on Flomax.  History of depression: Continue Seroquel/trazodone.  Apparently on admission-had possible suicidal intent-evaluated by psychiatry-and felt not to be an imminent danger to self/others.  Not felt to require inpatient psych admission.  History of constipation: Continue MOM.  Disposition issues: Lives alone-has a aide 24/7-given that he is bedbound/personality issues-family feels that they will not be able to manage him at home, extensive discussions in progress regarding possible SNF placement.  DM-2 (A1c 5.7 on 5/3): CBG stable-continue SSI.  CBG (last 3)  Recent Labs    05/06/22 2027 05/06/22 2314 05/07/22 0940  GLUCAP 118* 107* 86    Nutrition Status: Nutrition Problem: Inadequate oral intake Etiology: chronic illness Signs/Symptoms: meal completion < 50% Interventions: MVI, Refer to RD note for recommendations  BMI: Estimated body mass index is 27.12 kg/m as calculated from the following:   Height as of an earlier encounter on 04/27/22: 6' (1.829 m).   Weight as of an earlier encounter on 04/27/22: 90.7 kg.   Code status:   Code Status: Full Code  DVT Prophylaxis: SCDs Start: 04/27/22 2254   Family Communication: 2 daughters at  bedside on 7/13-none at bedside today.   Disposition Plan: Status is: Inpatient Remains inpatient appropriate because: Personality changes/encephalopathy due to residual meningioma-see above.  Awaiting SNF placement.   Planned Discharge Destination:Skilled nursing facility   Diet: Diet Order             Diet regular Room service appropriate? Yes; Fluid consistency: Thin  Diet effective now                     Antimicrobial agents: Anti-infectives (From admission, onward)    None        MEDICATIONS: Scheduled Meds:  baclofen  10 mg Oral TID   ciprofloxacin  1 drop Right Eye Q4H while awake   docusate sodium  200 mg Oral BID   escitalopram  20 mg Oral Daily   gabapentin  100 mg Oral BID   insulin aspart  0-9 Units Subcutaneous TID WC   lacosamide  100 mg Oral BID   lamoTRIgine  100 mg Oral BID   multivitamin with minerals  1 tablet Oral Daily   pantoprazole  40 mg Oral QHS   polyethylene glycol  17 g Oral Daily   QUEtiapine  50 mg Oral QHS   rosuvastatin  20 mg Oral Daily   senna-docusate  2 tablet Oral QHS   simethicone  80 mg Oral QID   tamsulosin  0.4 mg Oral QPC supper   Continuous Infusions:   PRN Meds:.acetaminophen **OR** acetaminophen, bisacodyl, bisacodyl, HYDROcodone-acetaminophen, lactulose, magnesium hydroxide, polyvinyl alcohol, sodium phosphate, traZODone   I have personally reviewed following labs and imaging studies  LABORATORY DATA:  Recent Labs  Lab 05/01/22 0049 05/03/22 0251 05/07/22 0031  WBC 8.4 7.6 9.0  HGB 11.0* 11.5* 11.6*  HCT 32.9* 35.1* 36.4*  PLT 326 336 310  MCV 88.2 88.2 90.1  MCH 29.5 28.9 28.7  MCHC 33.4 32.8 31.9  RDW 14.0 13.8 14.2  LYMPHSABS  --   --  1.3  MONOABS  --   --  0.8  EOSABS  --   --  0.2  BASOSABS  --   --  0.0    Recent Labs  Lab 05/01/22 0049 05/03/22 0251 05/07/22 0031  NA 139 140 142  K 3.7 3.9 3.9  CL 104 106 104  CO2 '25 24 25  '$ GLUCOSE 111* 122* 105*  BUN '14 13 14  '$ CREATININE  0.73 0.61 0.82  CALCIUM 9.1 8.8* 9.0  MG  --   --  2.4   Signature  Lala Lund M.D on 05/07/2022 at 10:56 AM   -  To page go to [email or web address]

## 2022-05-08 ENCOUNTER — Inpatient Hospital Stay (HOSPITAL_COMMUNITY): Payer: Medicare HMO

## 2022-05-08 DIAGNOSIS — G9341 Metabolic encephalopathy: Secondary | ICD-10-CM | POA: Diagnosis not present

## 2022-05-08 LAB — CBC WITH DIFFERENTIAL/PLATELET
Abs Immature Granulocytes: 0.03 10*3/uL (ref 0.00–0.07)
Basophils Absolute: 0 10*3/uL (ref 0.0–0.1)
Basophils Relative: 0 %
Eosinophils Absolute: 0.2 10*3/uL (ref 0.0–0.5)
Eosinophils Relative: 2 %
HCT: 35.6 % — ABNORMAL LOW (ref 39.0–52.0)
Hemoglobin: 11.2 g/dL — ABNORMAL LOW (ref 13.0–17.0)
Immature Granulocytes: 0 %
Lymphocytes Relative: 13 %
Lymphs Abs: 1.1 10*3/uL (ref 0.7–4.0)
MCH: 28.5 pg (ref 26.0–34.0)
MCHC: 31.5 g/dL (ref 30.0–36.0)
MCV: 90.6 fL (ref 80.0–100.0)
Monocytes Absolute: 0.9 10*3/uL (ref 0.1–1.0)
Monocytes Relative: 9 %
Neutro Abs: 6.9 10*3/uL (ref 1.7–7.7)
Neutrophils Relative %: 76 %
Platelets: 302 10*3/uL (ref 150–400)
RBC: 3.93 MIL/uL — ABNORMAL LOW (ref 4.22–5.81)
RDW: 14.2 % (ref 11.5–15.5)
WBC: 9.1 10*3/uL (ref 4.0–10.5)
nRBC: 0 % (ref 0.0–0.2)

## 2022-05-08 LAB — GLUCOSE, CAPILLARY
Glucose-Capillary: 104 mg/dL — ABNORMAL HIGH (ref 70–99)
Glucose-Capillary: 110 mg/dL — ABNORMAL HIGH (ref 70–99)
Glucose-Capillary: 112 mg/dL — ABNORMAL HIGH (ref 70–99)
Glucose-Capillary: 121 mg/dL — ABNORMAL HIGH (ref 70–99)
Glucose-Capillary: 88 mg/dL (ref 70–99)

## 2022-05-08 LAB — BASIC METABOLIC PANEL
Anion gap: 4 — ABNORMAL LOW (ref 5–15)
BUN: 13 mg/dL (ref 8–23)
CO2: 31 mmol/L (ref 22–32)
Calcium: 8.6 mg/dL — ABNORMAL LOW (ref 8.9–10.3)
Chloride: 104 mmol/L (ref 98–111)
Creatinine, Ser: 0.81 mg/dL (ref 0.61–1.24)
GFR, Estimated: >60 mL/min (ref >60)
Glucose, Bld: 116 mg/dL — ABNORMAL HIGH (ref 70–99)
Potassium: 3.4 mmol/L — ABNORMAL LOW (ref 3.5–5.1)
Sodium: 139 mmol/L (ref 135–145)

## 2022-05-08 LAB — MAGNESIUM: Magnesium: 2.5 mg/dL — ABNORMAL HIGH (ref 1.7–2.4)

## 2022-05-08 MED ORDER — POLYETHYLENE GLYCOL 3350 17 G PO PACK
17.0000 g | PACK | Freq: Two times a day (BID) | ORAL | Status: DC
  Administered 2022-05-08 – 2022-05-12 (×5): 17 g via ORAL
  Filled 2022-05-08 (×8): qty 1

## 2022-05-08 MED ORDER — BISACODYL 5 MG PO TBEC
10.0000 mg | DELAYED_RELEASE_TABLET | Freq: Once | ORAL | Status: AC
  Administered 2022-05-08: 10 mg via ORAL
  Filled 2022-05-08: qty 2

## 2022-05-08 MED ORDER — LACTULOSE 10 GM/15ML PO SOLN
30.0000 g | Freq: Two times a day (BID) | ORAL | Status: AC
Start: 2022-05-08 — End: 2022-05-09
  Administered 2022-05-08 – 2022-05-09 (×4): 30 g via ORAL
  Filled 2022-05-08 (×4): qty 45

## 2022-05-08 MED ORDER — GERHARDT'S BUTT CREAM
TOPICAL_CREAM | Freq: Three times a day (TID) | CUTANEOUS | Status: AC
  Filled 2022-05-08: qty 1

## 2022-05-08 MED ORDER — POTASSIUM CHLORIDE CRYS ER 20 MEQ PO TBCR
40.0000 meq | EXTENDED_RELEASE_TABLET | Freq: Once | ORAL | Status: AC
  Administered 2022-05-08: 40 meq via ORAL
  Filled 2022-05-08: qty 2

## 2022-05-08 NOTE — Progress Notes (Signed)
Daughter [name]) called for update of pt. Pt called her and stated, "No one has been in his room since 0915 and he needs help." RN assured daughter that RN and healthcare team have attending to pt care needs and rounded appropriately. Jenny Reichmann, B charge RN notified of situation.

## 2022-05-08 NOTE — TOC Progression Note (Addendum)
Transition of Care [facility]) - Progression Note    Patient Details  Name: Patrick Buchanan MRN: [id number] Date of Birth: Feb 25, 1951  Transition of Care [facility]) CM/SW [place], LCSW Phone Number: 05/08/2022, 8:55 AM  Clinical Narrative:    Insurance approval still pending and has gone to [employer] for review. Pasrr # received and placed on FL2.   CSW received call for peer to peer request: MD to call [phone] option 5, needs member name/dob/[employer] ID#. Final deadline is 9:30 am ET on 05/09/22.  CSW updated patient's daughter, [name].    Expected Discharge Plan: Skilled Nursing Facility Barriers to Discharge: Ship broker  Expected Discharge Plan and Services Expected Discharge Plan: [place] In-house Referral: Clinical Social Work Discharge Planning Services: CM Consult Post Acute Care Choice: [place] Living arrangements for the past 2 months: [place]: OT, PT [place] Agency: [place] Date [place]: 04/28/22 Time [place]: [id number] Representative spoke with at [place]: Marjory Lies- active with [place]. they are aware he is here in hospital   Social Determinants of Health (SDOH) Interventions    Readmission Risk Interventions     No data to display

## 2022-05-08 NOTE — Progress Notes (Signed)
Resection of                        PROGRESS NOTE        PATIENT DETAILS Name: Patrick Buchanan Age: 71 y.o. Sex: male Date of Birth: [birth year] Admit Date: 04/27/2022 Admitting Physician Nita Sells, MD JTT:[id number], [name], PA-C  Brief Summary: Patient is a 71 y.o.  male who is s/p status is after meningioma-with resultant left-sided hemiparesis-requiring 24/7 care by family/aides at home-presented to the hospital for bizarre behavior-he was subsequently admitted to the hospitalist service-see below for further details.  Significant events: 7/6>> admit to [facility] for personality changes/encephalopathy.  Significant studies: 7/6>> CT head: Postop changes in the right parietal convexity-rim-enhancing fluid collection containing gas-suspicious for abscess.  Numerous residual meningiomas are noted bilaterally with edema in the left frontal lobe/left parietal lobe. 7/6>> NH 4: Normal limit 7/7>> TSH: Within normal limit 7/8>> Spot EEG: No seizures 7/10>> x-ray left knee: No acute findings.  Significant microbiology data: 7/6>> urine culture: Aerococcus  Procedures: None  Consults: Neurology Neurosurgery Psychiatry  Subjective:   Patient in bed, appears comfortable, denies any headache, no fever, no chest pain or pressure, no shortness of breath , no abdominal pain, no nausea, does not feel constipated. No new focal weakness.     Objective: Vitals: Blood pressure 116/77, pulse 72, temperature 97.7 F (36.5 C), temperature source Oral, resp. rate 16, SpO2 96 %.   Exam:  Awake Alert, No new F.N deficits, continues to have left-sided hemiparesis [place].AT,PERRAL Supple Neck, No JVD,   Symmetrical Chest wall movement, Good air movement bilaterally, CTAB RRR,No Gallops, Rubs or new Murmurs,  +ve B.Sounds, Abd Soft, No tenderness,   No Cyanosis, Clubbing or edema    Assessment/Plan:  Acute metabolic encephalopathy/personality changes: After extensive work-up-felt to  be due to residual meningiomas/steroid use/possibly Keppra (now changed to Vimpat).  He is completely awake/alert at this point.  EEG was negative for seizures.  Neurosurgery does not feel that further surgery is going to be beneficial at this point-furthermore patient does not desire to pursue any further surgical intervention.  History of recent craniotomy with resection of large parasagittal meningioma-s/p left sided weakness-CT imaging continues to show vasogenic edema: Remains on steroids-Per neurosurgery-no evidence of abscess (ruled out).  Left-sided hemiplegia is stable/unchanged.  No longer on Keppra due to cognitive issues-has been switched to Vimpat.  History of urinary retention: Had Foley catheter as an outpatient which has been removed-patient voiding spontaneously-on Flomax.  History of depression: Continue Seroquel/trazodone.  Apparently on admission-had possible suicidal intent-evaluated by psychiatry-and felt not to be an imminent danger to self/others.  Not felt to require inpatient psych admission.  History of constipation: Placed on bowel regimen, has required multiple enemas and disimpaction per his request, KUB stable exam benign, continue to monitor  Disposition issues: Lives alone-has a aide 24/7-given that he is bedbound/personality issues-family feels that they will not be able to manage him at home, extensive discussions in progress regarding possible SNF placement.  DM-2 (A1c 5.7 on 5/3): CBG stable-continue SSI.  CBG (last 3)  Recent Labs    05/07/22 2313 05/08/22 0435 05/08/22 0753  GLUCAP 126* 121* 104*    Nutrition Status: Nutrition Problem: Inadequate oral intake Etiology: chronic illness Signs/Symptoms: meal completion < 50% Interventions: MVI, Refer to RD note for recommendations  BMI: Estimated body mass index is 27.12 kg/m as calculated from the following:   Height as of an earlier encounter on 04/27/22: 6' (1.829  m).   Weight as of an earlier  encounter on 04/27/22: 90.7 kg.   Code status:   Code Status: Full Code   DVT Prophylaxis: SCDs Start: 04/27/22 2254   Family Communication: 2 daughters at bedside on 7/13-none at bedside today.   Disposition Plan: Status is: Inpatient Remains inpatient appropriate because: Personality changes/encephalopathy due to residual meningioma-see above.  Awaiting SNF placement.   Planned Discharge Destination:Skilled nursing facility   Diet: Diet Order             Diet regular Room service appropriate? Yes; Fluid consistency: Thin  Diet effective now                     Antimicrobial agents: Anti-infectives (From admission, onward)    None        MEDICATIONS: Scheduled Meds:  baclofen  10 mg Oral TID   bisacodyl  10 mg Oral Once   ciprofloxacin  1 drop Right Eye Q4H while awake   docusate sodium  200 mg Oral BID   escitalopram  20 mg Oral Daily   gabapentin  100 mg Oral BID   insulin aspart  0-9 Units Subcutaneous TID WC   lacosamide  100 mg Oral BID   lactulose  30 g Oral BID   lamoTRIgine  100 mg Oral BID   multivitamin with minerals  1 tablet Oral Daily   pantoprazole  40 mg Oral QHS   polyethylene glycol  17 g Oral BID   QUEtiapine  50 mg Oral QHS   rosuvastatin  20 mg Oral Daily   senna-docusate  2 tablet Oral QHS   tamsulosin  0.4 mg Oral QPC supper   Continuous Infusions:   PRN Meds:.acetaminophen **OR** acetaminophen, bisacodyl, HYDROcodone-acetaminophen, magnesium hydroxide, polyvinyl alcohol, sodium phosphate, traZODone   I have personally reviewed following labs and imaging studies  LABORATORY DATA:  Recent Labs  Lab 05/03/22 0251 05/07/22 0031 05/08/22 0322  WBC 7.6 9.0 9.1  HGB 11.5* 11.6* 11.2*  HCT 35.1* 36.4* 35.6*  PLT 336 310 302  MCV 88.2 90.1 90.6  MCH 28.9 28.7 28.5  MCHC 32.8 31.9 31.5  RDW 13.8 14.2 14.2  LYMPHSABS  --  1.3 1.1  MONOABS  --  0.8 0.9  EOSABS  --  0.2 0.2  BASOSABS  --  0.0 0.0    Recent Labs  Lab  05/03/22 0251 05/07/22 0031 05/08/22 0322  NA 140 142 139  K 3.9 3.9 3.4*  CL 106 104 104  CO2 '24 25 31  '$ GLUCOSE 122* 105* 116*  BUN '13 14 13  '$ CREATININE 0.61 0.82 0.81  CALCIUM 8.8* 9.0 8.6*  MG  --  2.4 2.5*   Signature  Lala Lund M.D on 05/08/2022 at 11:04 AM   -  To page go to [email or web address]

## 2022-05-08 NOTE — Progress Notes (Signed)
Physical Therapy Treatment Patient Details Name: Patrick Buchanan MRN: [id number] DOB: September 30, 1951 Today's Date: 05/08/2022   History of Present Illness Pt is a 71 y/o male presented to ED on 04/27/22 for increased agitation, aggression, and suicidal ideation. PMH: hx of meningioma s/p resection 02/2022 with L hemiparesis, seizures, T2DM, depression    PT Comments    Pt continues to require heavy physical assistance of two people for functional mobility. Focus of session was on bed mobility and pericare as pt reporting that he had a BM in bed upon PT/OT arrival. Pt would continue to benefit from skilled physical therapy services at this time while admitted and after d/c to address the below listed limitations in order to improve overall safety and independence with functional mobility.    Recommendations for follow up therapy are one component of a multi-disciplinary discharge planning process, led by the attending physician.  Recommendations may be updated based on patient status, additional functional criteria and insurance authorization.  Follow Up Recommendations  Skilled nursing-short term rehab (<3 hours/day) Can patient physically be transported by private vehicle: No   Assistance Recommended at Discharge Frequent or constant Supervision/Assistance  Patient can return home with the following Two people to help with walking and/or transfers;A lot of help with bathing/dressing/bathroom;Assistance with cooking/housework;Assist for transportation   Equipment Recommendations  None recommended by PT    Recommendations for Other Services       Precautions / Restrictions Precautions Precautions: Fall Precaution Comments: neuropraxic LUE Restrictions Weight Bearing Restrictions: No     Mobility  Bed Mobility Overal bed mobility: Needs Assistance Bed Mobility: Rolling Rolling: Min assist, Total assist, +2 for physical assistance         General bed mobility comments: pt  requesting pericare following having a BM in the bed upon PT/OT arrival. He was able to roll towards his L side with min A. He required total A x2 to roll towards his R side. Total A for pericare. Pt in chair position in bed at end    Transfers                        Ambulation/Gait                   Stairs             Wheelchair Mobility    Modified Rankin (Stroke Patients Only)       Balance Overall balance assessment: Needs assistance Sitting-balance support: Single extremity supported, Feet supported Sitting balance-Leahy Scale: Poor Sitting balance - Comments: pt able to minimally clear his back off of the support of the bed with use of R UE on the bed rail                                    Cognition Arousal/Alertness: Awake/alert Behavior During Therapy: WFL for tasks assessed/performed Overall Cognitive Status: No family/caregiver present to determine baseline cognitive functioning Area of Impairment: Problem solving, Memory                     Memory: Decreased short-term memory       Problem Solving: Slow processing, Decreased initiation, Difficulty sequencing, Requires verbal cues          Exercises      General Comments        Pertinent Vitals/Pain Pain Assessment Pain Assessment: Faces Faces  Pain Scale: Hurts even more Pain Location: L shoulder and L LE with movement Pain Descriptors / Indicators: Guarding, Discomfort Pain Intervention(s): Monitored during session, Repositioned    Home Living                          Prior Function            PT Goals (current goals can now be found in the care plan section) Acute Rehab PT Goals PT Goal Formulation: With patient Time For Goal Achievement: 05/13/22 Potential to Achieve Goals: Fair Progress towards PT goals: Progressing toward goals    Frequency    Min 2X/week      PT Plan Current plan remains appropriate     Co-evaluation PT/OT/SLP Co-Evaluation/Treatment: Yes Reason for Co-Treatment: To address functional/ADL transfers;For patient/therapist safety PT goals addressed during session: Mobility/safety with mobility;Strengthening/ROM        AM-PAC PT "6 Clicks" Mobility   Outcome Measure  Help needed turning from your back to your side while in a flat bed without using bedrails?: A Lot Help needed moving from lying on your back to sitting on the side of a flat bed without using bedrails?: A Lot Help needed moving to and from a bed to a chair (including a wheelchair)?: Total Help needed standing up from a chair using your arms (e.g., wheelchair or bedside chair)?: Total Help needed to walk in hospital room?: Total Help needed climbing 3-5 steps with a railing? : Total 6 Click Score: 8    End of Session   Activity Tolerance: Patient limited by fatigue Patient left: in bed;with call bell/phone within reach;with bed alarm set Nurse Communication: Mobility status;Need for lift equipment PT Visit Diagnosis: Muscle weakness (generalized) (M62.81);Other abnormalities of gait and mobility (R26.89);Hemiplegia and hemiparesis Hemiplegia - Right/Left: Left Hemiplegia - dominant/non-dominant: Non-dominant Hemiplegia - caused by: Unspecified     Time: 4665-9935 PT Time Calculation (min) (ACUTE ONLY): 28 min  Charges:  $Therapeutic Activity: 8-22 mins                     Anastasio Champion, DPT  Acute Rehabilitation Services Office [place] 05/08/2022, 11:10 AM

## 2022-05-08 NOTE — Progress Notes (Signed)
Occupational Therapy Treatment Patient Details Name: Patrick Buchanan MRN: [id number] DOB: 13-Nov-1950 Today's Date: 05/08/2022   History of present illness Pt is a 71 y/o male presented to ED on 04/27/22 for increased agitation, aggression, and suicidal ideation. PMH: hx of meningioma s/p resection 02/2022 with L hemiparesis, seizures, T2DM, depression   OT comments  Focus of session on bed mobility for pericare, L UE self ROM and use as a stabilizer during grooming. Pt wanting another wrist cock up splint. Recommended pt wear splint only at night and, since he has finger and some wrist extension, to use his L UE during functional activities during the day. Pt educated in self ROM for wrist extension and shoulder flexion to 90 degrees maximum. Pt demonstrated understanding, but questionable if he will generalize.    Recommendations for follow up therapy are one component of a multi-disciplinary discharge planning process, led by the attending physician.  Recommendations may be updated based on patient status, additional functional criteria and insurance authorization.    Follow Up Recommendations  Skilled nursing-short term rehab (<3 hours/day)    Assistance Recommended at Discharge Frequent or constant Supervision/Assistance  Patient can return home with the following  A lot of help with walking and/or transfers;A lot of help with bathing/dressing/bathroom;Assistance with feeding;Assistance with cooking/housework;Direct supervision/assist for medications management;Direct supervision/assist for financial management;Assist for transportation;Help with stairs or ramp for entrance   Equipment Recommendations  None recommended by OT    Recommendations for Other Services      Precautions / Restrictions Precautions Precautions: Fall Precaution Comments: neuropraxic LUE Restrictions Weight Bearing Restrictions: No       Mobility Bed Mobility Overal bed mobility: Needs Assistance Bed  Mobility: Rolling Rolling: Min assist, Total assist, +2 for physical assistance         General bed mobility comments: pt requesting pericare following having a BM in the bed upon PT/OT arrival. He was able to roll towards his L side with min A. He required total A x2 to roll towards his R side. Total A for pericare. Pt in chair position in bed at end    Transfers                         Balance Overall balance assessment: Needs assistance Sitting-balance support: Single extremity supported, Feet supported Sitting balance-Leahy Scale: Poor Sitting balance - Comments: pt able to minimally clear his back off of the support of the bed with use of R UE on the bed rail                                   ADL either performed or assessed with clinical judgement   ADL Overall ADL's : Needs assistance/impaired     Grooming: Minimal assistance;Bed level Grooming Details (indicate cue type and reason): to apply lip balm                     Toileting- Clothing Manipulation and Hygiene: Total assistance;+2 for physical assistance;Bed level Toileting - Clothing Manipulation Details (indicate cue type and reason): pt with loose bowels, but aware he had BM            Extremity/Trunk Assessment Upper Extremity Assessment LUE Deficits / Details: Pt educated in self ROM of L wrist and shoulder to 90 degrees with support of scapula on bed. Pt does use as a stabilizer, but not consistently.  Vision       Perception     Praxis      Cognition Arousal/Alertness: Awake/alert Behavior During Therapy: Flat affect Overall Cognitive Status: No family/caregiver present to determine baseline cognitive functioning Area of Impairment: Problem solving, Memory                     Memory: Decreased short-term memory   Safety/Judgement: Decreased awareness of deficits   Problem Solving: Slow processing, Decreased initiation, Difficulty  sequencing, Requires verbal cues          Exercises      Shoulder Instructions       General Comments      Pertinent Vitals/ Pain       Pain Assessment Pain Assessment: Faces Faces Pain Scale: Hurts even more Pain Location: L shoulder and L LE with movement Pain Descriptors / Indicators: Guarding, Discomfort Pain Intervention(s): Monitored during session, Repositioned  Home Living                                          Prior Functioning/Environment              Frequency  Min 2X/week        Progress Toward Goals  OT Goals(current goals can now be found in the care plan section)  Progress towards OT goals: Progressing toward goals  Acute Rehab OT Goals OT Goal Formulation: With patient Time For Goal Achievement: 05/13/22 Potential to Achieve Goals: Good  Plan Discharge plan remains appropriate;Frequency remains appropriate    Co-evaluation      Reason for Co-Treatment: To address functional/ADL transfers;For patient/therapist safety PT goals addressed during session: Mobility/safety with mobility;Strengthening/ROM        AM-PAC OT "6 Clicks" Daily Activity     Outcome Measure   Help from another person eating meals?: A Little Help from another person taking care of personal grooming?: A Little Help from another person toileting, which includes using toliet, bedpan, or urinal?: A Lot Help from another person bathing (including washing, rinsing, drying)?: A Lot Help from another person to put on and taking off regular upper body clothing?: A Lot Help from another person to put on and taking off regular lower body clothing?: Total 6 Click Score: 13    End of Session    OT Visit Diagnosis: Other abnormalities of gait and mobility (R26.89);Muscle weakness (generalized) (M62.81);Hemiplegia and hemiparesis Hemiplegia - Right/Left: Left Hemiplegia - dominant/non-dominant: Non-Dominant Hemiplegia - caused by: Unspecified    Activity Tolerance Patient tolerated treatment well   Patient Left in bed;with call bell/phone within reach   Nurse Communication Other (comment) (aware pt had loose BM)        Time: 1583-0940 OT Time Calculation (min): 27 min  Charges: OT General Charges $OT Visit: 1 Visit OT Treatments $Therapeutic Activity: 8-22 mins  Cleta Alberts, OTR/L Acute Rehabilitation Services Office: [phone]   Malka So 05/08/2022, 1:26 PM

## 2022-05-08 NOTE — Plan of Care (Signed)

## 2022-05-08 NOTE — Progress Notes (Signed)
RN provided Risk manager education in regards to pt using extension cords from home to plug in cell phone charger. Family friend ([name]) at bedside agreed to take extension cords with him when leaving facilities and plans to buy an extended cell phone charging cord for pt. Hewitt Blade charge, Therapist, sports and

## 2022-05-09 DIAGNOSIS — G9341 Metabolic encephalopathy: Secondary | ICD-10-CM | POA: Diagnosis not present

## 2022-05-09 LAB — CBC WITH DIFFERENTIAL/PLATELET
Abs Immature Granulocytes: 0.04 10*3/uL (ref 0.00–0.07)
Basophils Absolute: 0 10*3/uL (ref 0.0–0.1)
Basophils Relative: 0 %
Eosinophils Absolute: 0.2 10*3/uL (ref 0.0–0.5)
Eosinophils Relative: 2 %
HCT: 38 % — ABNORMAL LOW (ref 39.0–52.0)
Hemoglobin: 12.5 g/dL — ABNORMAL LOW (ref 13.0–17.0)
Immature Granulocytes: 1 %
Lymphocytes Relative: 15 %
Lymphs Abs: 1.3 10*3/uL (ref 0.7–4.0)
MCH: 29 pg (ref 26.0–34.0)
MCHC: 32.9 g/dL (ref 30.0–36.0)
MCV: 88.2 fL (ref 80.0–100.0)
Monocytes Absolute: 0.8 10*3/uL (ref 0.1–1.0)
Monocytes Relative: 9 %
Neutro Abs: 6.3 10*3/uL (ref 1.7–7.7)
Neutrophils Relative %: 73 %
Platelets: 326 10*3/uL (ref 150–400)
RBC: 4.31 MIL/uL (ref 4.22–5.81)
RDW: 14 % (ref 11.5–15.5)
WBC: 8.7 10*3/uL (ref 4.0–10.5)
nRBC: 0 % (ref 0.0–0.2)

## 2022-05-09 LAB — BASIC METABOLIC PANEL
Anion gap: 10 (ref 5–15)
BUN: 7 mg/dL — ABNORMAL LOW (ref 8–23)
CO2: 24 mmol/L (ref 22–32)
Calcium: 9.2 mg/dL (ref 8.9–10.3)
Chloride: 106 mmol/L (ref 98–111)
Creatinine, Ser: 0.76 mg/dL (ref 0.61–1.24)
GFR, Estimated: >60 mL/min (ref >60)
Glucose, Bld: 89 mg/dL (ref 70–99)
Potassium: 4 mmol/L (ref 3.5–5.1)
Sodium: 140 mmol/L (ref 135–145)

## 2022-05-09 LAB — GLUCOSE, CAPILLARY
Glucose-Capillary: 87 mg/dL (ref 70–99)
Glucose-Capillary: 87 mg/dL (ref 70–99)
Glucose-Capillary: 95 mg/dL (ref 70–99)
Glucose-Capillary: 88 mg/dL (ref 70–99)

## 2022-05-09 LAB — MAGNESIUM: Magnesium: 2.1 mg/dL (ref 1.7–2.4)

## 2022-05-09 MED ORDER — SALINE SPRAY 0.65 % NA SOLN: 1.0000 | NASAL | Status: DC | PRN

## 2022-05-09 MED ORDER — ALPRAZOLAM 0.5 MG PO TABS
0.5000 mg | ORAL_TABLET | Freq: Two times a day (BID) | ORAL | Status: DC | PRN
Start: 2022-05-09 — End: 2022-05-13
  Administered 2022-05-09 – 2022-05-12 (×4): 0.5 mg via ORAL
  Filled 2022-05-09 (×4): qty 1

## 2022-05-09 NOTE — Progress Notes (Signed)
Resection of                        PROGRESS NOTE        PATIENT DETAILS Name: Patrick Buchanan Age: 71 y.o. Sex: male Date of Birth: 08-10-1951 Admit Date: 04/27/2022 Admitting Physician Nita Sells, MD ZOX:[id number], [name], PA-C  Brief Summary: Patient is a 71 y.o.  male who is s/p status is after meningioma-with resultant left-sided hemiparesis-requiring 24/7 care by family/aides at home-presented to the hospital for bizarre behavior-he was subsequently admitted to the hospitalist service-see below for further details.  Significant events: 7/6>> admit to [facility] for personality changes/encephalopathy.  Significant studies: 7/6>> CT head: Postop changes in the right parietal convexity-rim-enhancing fluid collection containing gas-suspicious for abscess.  Numerous residual meningiomas are noted bilaterally with edema in the left frontal lobe/left parietal lobe. 7/6>> NH 4: Normal limit 7/7>> TSH: Within normal limit 7/8>> Spot EEG: No seizures 7/10>> x-ray left knee: No acute findings.  Significant microbiology data: 7/6>> urine culture: Aerococcus  Procedures: None  Consults: Neurology Neurosurgery Psychiatry  Subjective:   Patient in bed appears to be in no distress, no chest or abdominal pain, no constipation, feels a little anxious, sees he feels like that off and on for several years wants to see if antianxiety medications will help him.  Objective: Vitals: Blood pressure 116/81, pulse 90, temperature 98.4 F (36.9 C), temperature source Oral, resp. rate 16, SpO2 94 %.   Exam:  Awake Alert, No new F.N deficits, continues to have left-sided hemiparesis, mildly anxious [place].AT,PERRAL Supple Neck, No JVD,   Symmetrical Chest wall movement, Good air movement bilaterally, CTAB RRR,No Gallops, Rubs or new Murmurs,  +ve B.Sounds, Abd Soft, No tenderness,   No Cyanosis, Clubbing or edema     Assessment/Plan:  Acute metabolic encephalopathy/personality  changes: After extensive work-up-felt to be due to residual meningiomas/steroid use/possibly Keppra (now changed to Vimpat).  He is completely awake/alert at this point.  EEG was negative for seizures.  Neurosurgery does not feel that further surgery is going to be beneficial at this point-furthermore patient does not desire to pursue any further surgical intervention.  History of recent craniotomy with resection of large parasagittal meningioma-s/p left sided weakness-CT imaging continues to show vasogenic edema: Remains on steroids-Per neurosurgery-no evidence of abscess (ruled out).  Left-sided hemiplegia is stable/unchanged.  No longer on Keppra due to cognitive issues-has been switched to Vimpat.  History of urinary retention: Had Foley catheter as an outpatient which has been removed-patient voiding spontaneously-on Flomax.  History of depression: Continue Seroquel/trazodone.  Apparently on admission-had possible suicidal intent-evaluated by psychiatry-and felt not to be an imminent danger to self/others.  Not felt to require inpatient psych admission.  History of constipation: Placed on bowel regimen, has required multiple enemas and disimpaction per his request, KUB stable exam benign, continue to monitor  Disposition issues: Lives alone-has a aide 24/7-given that he is bedbound/personality issues-family feels that they will not be able to manage him at home, extensive discussions in progress regarding possible SNF placement.  Mild anxiety.  As needed Xanax added.    DM-2 (A1c 5.7 on 5/3): CBG stable-continue SSI.  CBG (last 3)  Recent Labs    05/08/22 1622 05/08/22 2334 05/09/22 0757  GLUCAP 112* 88 87    Nutrition Status: Nutrition Problem: Inadequate oral intake Etiology: chronic illness Signs/Symptoms: meal completion < 50% Interventions: MVI, Refer to RD note for recommendations  BMI: Estimated body mass index is 27.12 kg/m  as calculated from the following:   Height as  of an earlier encounter on 04/27/22: 6' (1.829 m).   Weight as of an earlier encounter on 04/27/22: 90.7 kg.   Code status:   Code Status: Full Code   DVT Prophylaxis: SCDs Start: 04/27/22 2254   Family Communication:   Daughter [name] [phone] on 05/09/2022 at 11:35 AM message left   Disposition Plan: Status is: Inpatient Remains inpatient appropriate because: Personality changes/encephalopathy due to residual meningioma-see above.  Awaiting SNF placement.   Planned Discharge Destination:Skilled nursing facility   Diet: Diet Order             Diet regular Room service appropriate? Yes; Fluid consistency: Thin  Diet effective now                     Antimicrobial agents: Anti-infectives (From admission, onward)    None        MEDICATIONS: Scheduled Meds:  baclofen  10 mg Oral TID   ciprofloxacin  1 drop Right Eye Q4H while awake   docusate sodium  200 mg Oral BID   escitalopram  20 mg Oral Daily   gabapentin  100 mg Oral BID   Gerhardt's butt cream   Topical TID   insulin aspart  0-9 Units Subcutaneous TID WC   lacosamide  100 mg Oral BID   lactulose  30 g Oral BID   lamoTRIgine  100 mg Oral BID   multivitamin with minerals  1 tablet Oral Daily   pantoprazole  40 mg Oral QHS   polyethylene glycol  17 g Oral BID   QUEtiapine  50 mg Oral QHS   rosuvastatin  20 mg Oral Daily   senna-docusate  2 tablet Oral QHS   tamsulosin  0.4 mg Oral QPC supper   Continuous Infusions:   PRN Meds:.acetaminophen **OR** acetaminophen, ALPRAZolam, bisacodyl, HYDROcodone-acetaminophen, magnesium hydroxide, polyvinyl alcohol, sodium chloride, sodium phosphate, traZODone   I have personally reviewed following labs and imaging studies  LABORATORY DATA:  Recent Labs  Lab 05/03/22 0251 05/07/22 0031 05/08/22 0322 05/09/22 0509  WBC 7.6 9.0 9.1 8.7  HGB 11.5* 11.6* 11.2* 12.5*  HCT 35.1* 36.4* 35.6* 38.0*  PLT 336 310 302 326  MCV 88.2 90.1 90.6 88.2  MCH 28.9  28.7 28.5 29.0  MCHC 32.8 31.9 31.5 32.9  RDW 13.8 14.2 14.2 14.0  LYMPHSABS  --  1.3 1.1 1.3  MONOABS  --  0.8 0.9 0.8  EOSABS  --  0.2 0.2 0.2  BASOSABS  --  0.0 0.0 0.0    Recent Labs  Lab 05/03/22 0251 05/07/22 0031 05/08/22 0322 05/09/22 0509  NA 140 142 139 140  K 3.9 3.9 3.4* 4.0  CL 106 104 104 106  CO2 '24 25 31 24  '$ GLUCOSE 122* 105* 116* 89  BUN '13 14 13 '$ 7*  CREATININE 0.61 0.82 0.81 0.76  CALCIUM 8.8* 9.0 8.6* 9.2  MG  --  2.4 2.5* 2.1   Signature  Lala Lund M.D on 05/09/2022 at 11:35 AM   -  To page go to [email or web address]

## 2022-05-09 NOTE — Consult Note (Signed)
   [facility] Inpatient Consult   05/09/2022  VICKEY EWBANK 04/28/1951 [id number]   [place] [facility] [ACO] Patient: [employer] Medicare  Primary Care Provider:  [facility]   Patient has been outreached by a [place] NP-geriatric prior to admission [pending status].   Following for high risk for unplanned readmission score.  Review of patient reveals patient is awaiting confirmation approval per inpatient TOC LCSW notes. PT/OT recommending a skilled nursing facility for ST rehab noted.  Plan: Following  Of note, [facility] Care Management services does not replace or interfere with any services that are needed or arranged by inpatient [facility] care management team.  For additional questions or referrals please contact:  Natividad Brood, RN BSN [facility] Liaison  [phone] business mobile phone Toll free office [phone]  Fax number: [phone] [email or web address]

## 2022-05-09 NOTE — TOC Progression Note (Signed)
Transition of Care [facility]) - Progression Note    Patient Details  Name: Patrick Buchanan MRN: [id number] Date of Birth: 1950/12/31  Transition of Care [facility]) CM/SW [place], LCSW Phone Number: 05/09/2022, 3:38 PM  Clinical Narrative:    Patrick Buchanan has denied rehab approval after peer to peer was completed. CSW updated patient's daughter on need to move forward with private pay process. She will speak with her family and call CSW back.    Expected Discharge Plan: Skilled Nursing Facility Barriers to Discharge: Private Pay  Expected Discharge Plan and Services Expected Discharge Plan: [place] In-house Referral: Clinical Social Work Discharge Planning Services: CM Consult Post Acute Care Choice: [place] Living arrangements for the past 2 months: [place]: OT, PT HH Agency: [place] Date [place]: 04/28/22 Time [place]: [id number] Representative spoke with at [place]: Marjory Lies- active with [place]. they are aware he is here in hospital   Social Determinants of Health (SDOH) Interventions    Readmission Risk Interventions     No data to display

## 2022-05-10 DIAGNOSIS — G9341 Metabolic encephalopathy: Secondary | ICD-10-CM | POA: Diagnosis not present

## 2022-05-10 LAB — GLUCOSE, CAPILLARY
Glucose-Capillary: 88 mg/dL (ref 70–99)
Glucose-Capillary: 81 mg/dL (ref 70–99)
Glucose-Capillary: 85 mg/dL (ref 70–99)
Glucose-Capillary: 81 mg/dL (ref 70–99)

## 2022-05-10 NOTE — TOC Transition Note (Signed)
Transition of Care [facility]) - CM/SW Discharge Note   Patient Details  Name: Patrick Buchanan MRN: [id number] Date of Birth: 1951/06/15  Transition of Care [facility]) CM/SW Contact:  Benard Halsted, LCSW Phone Number: 05/10/2022, 2:17 PM   Clinical Narrative:    CSW contacted patient's daughter, [name]. A family friend was able to speak with admissions at [facility] and facility is now able to accept patient private pay. CSW confirmed with [facility] that they can accept patient tomorrow pending financials. CSW provided Crystal with [employer] for daughter, [name], to arrange paperwork.     Final next level of care: Skilled Nursing Facility Barriers to Discharge: SNF Pending bed offer   Patient Goals and CMS Choice Patient states their goals for this hospitalization and ongoing recovery are:: Rehab CMS Medicare.gov Compare Post Acute Care list provided to:: Patient Choice offered to / list presented to : Patient, Adult Children  Discharge Placement                       Discharge Plan and Services In-house Referral: Clinical Social Work Discharge Planning Services: CM Consult Post Acute Care Choice: Skilled Nursing Facility                    HH Arranged: OT, PT [facility] Agency: [place] Date [place]: 04/28/22 Time [place]: [id number] Representative spoke with at [place]: Marjory Lies- active with [facility]. they are aware he is here in hospital  Social Determinants of Health (SDOH) Interventions     Readmission Risk Interventions     No data to display

## 2022-05-10 NOTE — Plan of Care (Signed)
  Problem: Coping: Goal: Ability to adjust to condition or change in health will improve Outcome: Progressing   

## 2022-05-10 NOTE — Progress Notes (Signed)
Resection of                        PROGRESS NOTE        PATIENT DETAILS Name: Patrick Buchanan Age: 71 y.o. Sex: male Date of Birth: 03-Oct-1951 Admit Date: 04/27/2022 Admitting Physician Patrick Sells, MD ASN:[id number], Patrick Grays, PA-C  Brief Summary: Patient is a 71 y.o.  male who is s/p status is after meningioma-with resultant left-sided hemiparesis-requiring 24/7 care by family/aides at home-presented to the hospital for bizarre behavior-he was subsequently admitted to the hospitalist service-see below for further details.  Significant events: 7/6>> admit to [facility] for personality changes/encephalopathy.  Significant studies: 7/6>> CT head: Postop changes in the right parietal convexity-rim-enhancing fluid collection containing gas-suspicious for abscess.  Numerous residual meningiomas are noted bilaterally with edema in the left frontal lobe/left parietal lobe. 7/6>> NH 4: Normal limit 7/7>> TSH: Within normal limit 7/8>> Spot EEG: No seizures 7/10>> x-ray left knee: No acute findings.  Significant microbiology data: 7/6>> urine culture: Aerococcus  Procedures: None  Consults: Neurology Neurosurgery Psychiatry  Subjective:  Patient in bed, appears comfortable, denies any headache, no fever, no chest pain or pressure, no shortness of breath , no abdominal pain. No new focal weakness.  Anxiety much improved.   Objective: Vitals: Blood pressure 110/70, pulse 69, temperature 98 F (36.7 C), temperature source Axillary, resp. rate 12, SpO2 93 %.   Exam:  Awake Alert, No new F.N deficits, continues to have left-sided hemiparesis, no anxiety today [place].AT,PERRAL Supple Neck, No JVD,   Symmetrical Chest wall movement, Good air movement bilaterally, CTAB RRR,No Gallops, Rubs or new Murmurs,  +ve B.Sounds, Abd Soft, No tenderness,   No Cyanosis, Clubbing or edema      Assessment/Plan:  Acute metabolic encephalopathy/personality changes: After extensive  work-up-felt to be due to residual meningiomas/steroid use/possibly Keppra (now changed to Vimpat).  He is completely awake/alert at this point.  EEG was negative for seizures.  Neurosurgery does not feel that further surgery is going to be beneficial at this point-furthermore patient does not desire to pursue any further surgical intervention.  History of recent craniotomy with resection of large parasagittal meningioma-s/p left sided weakness-CT imaging continues to show vasogenic edema: Remains on steroids-Per neurosurgery-no evidence of abscess (ruled out).  Left-sided hemiplegia is stable/unchanged.  No longer on Keppra due to cognitive issues-has been switched to Vimpat.  History of urinary retention: Had Foley catheter as an outpatient which has been removed-patient voiding spontaneously-on Flomax.  History of depression: Continue Seroquel/trazodone.  Apparently on admission-had possible suicidal intent-evaluated by psychiatry-and felt not to be an imminent danger to self/others.  Not felt to require inpatient psych admission.  History of constipation: Placed on bowel regimen, has required multiple enemas and disimpaction per his request, KUB stable exam benign, continue to monitor  Disposition issues: Lives alone-has a aide 24/7-given that he is bedbound/personality issues-family feels that they will not be able to manage him at home, extensive discussions in progress regarding possible SNF placement.  Mild anxiety.  As needed Xanax added, much improved.    DM-2 (A1c 5.7 on 5/3): CBG stable-continue SSI.  CBG (last 3)  Recent Labs    05/09/22 1558 05/09/22 2046 05/10/22 0829  GLUCAP 95 88 88    Nutrition Status: Nutrition Problem: Inadequate oral intake Etiology: chronic illness Signs/Symptoms: meal completion < 50% Interventions: MVI, Refer to RD note for recommendations  BMI: Estimated body mass index is 27.12 kg/m as calculated from the following:  Height as of an  earlier encounter on 04/27/22: 6' (1.829 m).   Weight as of an earlier encounter on 04/27/22: 90.7 kg.   Code status:   Code Status: Full Code   DVT Prophylaxis: SCDs Start: 04/27/22 2254   Family Communication:   Daughter [name] [phone] on 05/09/2022 at 11:35 AM message left   Disposition Plan: Status is: Inpatient Remains inpatient appropriate because: Personality changes/encephalopathy due to residual meningioma-see above.  Awaiting SNF placement.   Planned Discharge Destination:Skilled nursing facility   Diet: Diet Order             Diet regular Room service appropriate? Yes; Fluid consistency: Thin  Diet effective now                     Antimicrobial agents: Anti-infectives (From admission, onward)    None        MEDICATIONS: Scheduled Meds:  baclofen  10 mg Oral TID   ciprofloxacin  1 drop Right Eye Q4H while awake   docusate sodium  200 mg Oral BID   escitalopram  20 mg Oral Daily   gabapentin  100 mg Oral BID   Gerhardt's butt cream   Topical TID   insulin aspart  0-9 Units Subcutaneous TID WC   lacosamide  100 mg Oral BID   lamoTRIgine  100 mg Oral BID   multivitamin with minerals  1 tablet Oral Daily   pantoprazole  40 mg Oral QHS   polyethylene glycol  17 g Oral BID   QUEtiapine  50 mg Oral QHS   rosuvastatin  20 mg Oral Daily   senna-docusate  2 tablet Oral QHS   tamsulosin  0.4 mg Oral QPC supper   Continuous Infusions:   PRN Meds:.acetaminophen **OR** acetaminophen, ALPRAZolam, bisacodyl, HYDROcodone-acetaminophen, magnesium hydroxide, polyvinyl alcohol, sodium chloride, sodium phosphate, traZODone   I have personally reviewed following labs and imaging studies  LABORATORY DATA:  Recent Labs  Lab 05/07/22 0031 05/08/22 0322 05/09/22 0509  WBC 9.0 9.1 8.7  HGB 11.6* 11.2* 12.5*  HCT 36.4* 35.6* 38.0*  PLT 310 302 326  MCV 90.1 90.6 88.2  MCH 28.7 28.5 29.0  MCHC 31.9 31.5 32.9  RDW 14.2 14.2 14.0  LYMPHSABS 1.3 1.1  1.3  MONOABS 0.8 0.9 0.8  EOSABS 0.2 0.2 0.2  BASOSABS 0.0 0.0 0.0    Recent Labs  Lab 05/07/22 0031 05/08/22 0322 05/09/22 0509  NA 142 139 140  K 3.9 3.4* 4.0  CL 104 104 106  CO2 '25 31 24  '$ GLUCOSE 105* 116* 89  BUN 14 13 7*  CREATININE 0.82 0.81 0.76  CALCIUM 9.0 8.6* 9.2  MG 2.4 2.5* 2.1   Signature  Lala Lund M.D on 05/10/2022 at 9:41 AM   -  To page go to [email or web address]

## 2022-05-11 ENCOUNTER — Ambulatory Visit: Payer: Medicare HMO | Admitting: Physical Therapy

## 2022-05-11 ENCOUNTER — Encounter: Payer: Medicare HMO | Admitting: Occupational Therapy

## 2022-05-11 DIAGNOSIS — G9341 Metabolic encephalopathy: Secondary | ICD-10-CM | POA: Diagnosis not present

## 2022-05-11 LAB — GLUCOSE, CAPILLARY
Glucose-Capillary: 80 mg/dL (ref 70–99)
Glucose-Capillary: 99 mg/dL (ref 70–99)
Glucose-Capillary: 80 mg/dL (ref 70–99)
Glucose-Capillary: 82 mg/dL (ref 70–99)

## 2022-05-11 MED ORDER — DIPHENHYDRAMINE HCL 25 MG PO CAPS
25.0000 mg | ORAL_CAPSULE | Freq: Once | ORAL | Status: AC
  Administered 2022-05-12: 25 mg via ORAL
  Filled 2022-05-11: qty 1

## 2022-05-11 MED ORDER — LACTULOSE 10 GM/15ML PO SOLN
30.0000 g | Freq: Three times a day (TID) | ORAL | Status: AC
  Administered 2022-05-11 (×3): 30 g via ORAL
  Filled 2022-05-11 (×3): qty 45

## 2022-05-11 MED ORDER — BISACODYL 10 MG RE SUPP
10.0000 mg | Freq: Once | RECTAL | Status: AC
  Administered 2022-05-11: 10 mg via RECTAL
  Filled 2022-05-11: qty 1

## 2022-05-11 MED ORDER — MAGNESIUM HYDROXIDE 400 MG/5ML PO SUSP
30.0000 mL | Freq: Two times a day (BID) | ORAL | Status: AC
  Administered 2022-05-11 (×2): 30 mL via ORAL
  Filled 2022-05-11 (×2): qty 30

## 2022-05-11 MED ORDER — SORBITOL 70 % SOLN
960.0000 mL | TOPICAL_OIL | Freq: Once | ORAL | Status: AC
  Administered 2022-05-12: 960 mL via RECTAL
  Filled 2022-05-11: qty 473

## 2022-05-11 NOTE — Progress Notes (Signed)
Pt was seen for moving to side of bed and progressing through standing attempts with L side supported both at the knee and arm.  Pt is hypervigilant about the sensitivity of L side to both roll and sit up, and requires a lot of time to set up the transition.  Pt was very passive upon return to bed, basically lifted to the supine posture.  Will need to work on confidence with pt about his ability to help as well as attention to the task.  Follow for acute PT goals and encourage OOB to chair as tolerated.   05/11/22 1800  PT Visit Information  Last PT Received On 05/11/22  Assistance Needed +2  History of Present Illness Pt is a 71 y/o male presented to ED on 04/27/22 for increased agitation, aggression, and suicidal ideation. PMH: hx of meningioma s/p resection 02/2022 with L hemiparesis, seizures, T2DM, depression  Subjective Data  Subjective verbalizes a lot of concerns about pain on LUE, his progress and expectations for therapy  Patient Stated Goal to get more therapy  Precautions  Precautions Fall  Precaution Comments neuropraxic LUE  Restrictions  Weight Bearing Restrictions No  Pain Assessment  Pain Assessment Faces  Faces Pain Scale 6  Pain Location L shoulder and L LE with movement  Pain Descriptors / Indicators Grimacing;Guarding  Pain Intervention(s) Limited activity within patient's tolerance;Monitored during session;Repositioned  Cognition  Arousal/Alertness Awake/alert  Behavior During Therapy Flat affect;Anxious  Overall Cognitive Status No family/caregiver present to determine baseline cognitive functioning  Area of Impairment Safety/judgement;Awareness;Following commands;Problem solving  Current Attention Level Selective  Memory Decreased short-term memory  Following Commands Follows one step commands with increased time  Safety/Judgement Decreased awareness of deficits  Awareness Intellectual  Problem Solving Slow processing;Difficulty sequencing;Decreased  initiation;Requires verbal cues;Requires tactile cues  General Comments anxiety and hesitation to move and stand, was untrusting of the possibility of second standing attempt, focusing on many topics  Bed Mobility  Overal bed mobility Needs Assistance  Bed Mobility Supine to Sit;Sit to Supine  Rolling Min assist  Supine to sit Max assist  Sit to supine Max assist  General bed mobility comments hyperfocused on LUE and LE with PT assisting to roll or move him  Transfers  Overall transfer level Needs assistance  Equipment used 1 person hand held assist  Transfers Sit to/from Stand  Sit to Stand Max assist  General transfer comment assisted to stand directly with support first on R knee then L knee and pt could attempt posterior steps on LLE and lift/lower on RLE  Ambulation/Gait  General Gait Details unable  Balance  Overall balance assessment Needs assistance  Sitting-balance support Feet supported;Single extremity supported  Sitting balance-Leahy Scale Poor  Standing balance support Bilateral upper extremity supported;During functional activity  Standing balance-Leahy Scale Poor  General Comments  General comments (skin integrity, edema, etc.) Instructed pt to move in sequence to stand with trunk support on LUE side, and wtih direct knee support.  Pt is quickly panicked to sit on second attempt  PT - End of Session  Equipment Utilized During Treatment Gait belt  Activity Tolerance Patient limited by fatigue;Treatment limited secondary to agitation  Patient left in bed;with call bell/phone within reach;with bed alarm set  Nurse Communication Mobility status   PT - Assessment/Plan  PT Plan Current plan remains appropriate  PT Visit Diagnosis Muscle weakness (generalized) (M62.81);Other abnormalities of gait and mobility (R26.89);Hemiplegia and hemiparesis  Hemiplegia - Right/Left Left  Hemiplegia - dominant/non-dominant Non-dominant  Hemiplegia -  caused by Unspecified  PT Frequency  (ACUTE ONLY) Min 2X/week  Follow Up Recommendations Skilled nursing-short term rehab (<3 hours/day)  Can patient physically be transported by private vehicle No  Assistance recommended at discharge Frequent or constant Supervision/Assistance  Patient can return home with the following Two people to help with walking and/or transfers;A lot of help with bathing/dressing/bathroom;Assistance with cooking/housework;Assist for transportation  PT equipment None recommended by PT  AM-PAC PT "6 Clicks" Mobility Outcome Measure (Version 2)  Help needed turning from your back to your side while in a flat bed without using bedrails? 2  Help needed moving from lying on your back to sitting on the side of a flat bed without using bedrails? 2  Help needed moving to and from a bed to a chair (including a wheelchair)? 1  Help needed standing up from a chair using your arms (e.g., wheelchair or bedside chair)? 2  Help needed to walk in hospital room? 1  Help needed climbing 3-5 steps with a railing?  1  6 Click Score 9  Consider Recommendation of Discharge To: CIR/SNF/LTACH  Progressive Mobility  What is the highest level of mobility based on the progressive mobility assessment? Level 3 (Stands with assist) - Balance while standing  and cannot march in place  PT Goal Progression  Progress towards PT goals Progressing toward goals  PT Time Calculation  PT Start Time (ACUTE ONLY) 1535  PT Stop Time (ACUTE ONLY) 1629  PT Time Calculation (min) (ACUTE ONLY) 54 min  PT General Charges  $$ ACUTE PT VISIT 1 Visit  PT Treatments  $Therapeutic Activity 23-37 mins  $Neuromuscular Re-education 23-37 mins   Mee Hives, PT PhD Acute Rehab Dept. Number: [place] and [place]

## 2022-05-11 NOTE — Plan of Care (Signed)

## 2022-05-11 NOTE — Progress Notes (Signed)
Spoke to [place] with [facility] who reports their business office has not yet received payment in order to admit pt today. Spoke to dtr [place] who reports they have been attempting to secure payment with pt's [place] but do not have confirmation yet. Anticipate dc tomorrow provided payment is made to [facility].  Wandra Feinstein, MSW, LCSW [phone] (coverage)

## 2022-05-11 NOTE — Progress Notes (Signed)
Resection of                        PROGRESS NOTE        PATIENT DETAILS Name: Patrick Buchanan Age: 71 y.o. Sex: male Date of Birth: 1951-01-30 Admit Date: 04/27/2022 Admitting Physician Patrick Sells, MD [id number], Patrick Grays, PA-C  Brief Summary: Patient is a 71 y.o.  male who is s/p status is after meningioma-with resultant left-sided hemiparesis-requiring 24/7 care by family/aides at home-presented to the hospital for bizarre behavior-he was subsequently admitted to the hospitalist service-see below for further details.  Significant events: 7/6>> admit to [facility] for personality changes/encephalopathy.  Significant studies: 7/6>> CT head: Postop changes in the right parietal convexity-rim-enhancing fluid collection containing gas-suspicious for abscess.  Numerous residual meningiomas are noted bilaterally with edema in the left frontal lobe/left parietal lobe. 7/6>> NH 4: Normal limit 7/7>> TSH: Within normal limit 7/8>> Spot EEG: No seizures 7/10>> x-ray left knee: No acute findings.  Significant microbiology data: 7/6>> urine culture: Aerococcus  Procedures: None  Consults: Neurology Neurosurgery Psychiatry  Subjective:  Patient in bed, appears comfortable, denies any headache, no fever, no chest pain or pressure, no shortness of breath , no abdominal pain. No new focal weakness.  Improved anxiety, feels constipated again.   Objective: Vitals: Blood pressure 107/72, pulse 72, temperature (!) 97.2 F (36.2 C), temperature source Oral, resp. rate 10, SpO2 95 %.   Exam:  Awake Alert, No new F.N deficits, continues to have left-sided hemiparesis, no anxiety today [place].AT,PERRAL Supple Neck, No JVD,   Symmetrical Chest wall movement, Good air movement bilaterally, CTAB RRR,No Gallops, Rubs or new Murmurs,  +ve B.Sounds, Abd Soft, No tenderness,   No Cyanosis, Clubbing or edema     Assessment/Plan:  Acute metabolic encephalopathy/personality changes: After  extensive work-up-felt to be due to residual meningiomas/steroid use/possibly Keppra (now changed to Vimpat).  He is completely awake/alert at this point.  EEG was negative for seizures.  Neurosurgery does not feel that further surgery is going to be beneficial at this point-furthermore patient does not desire to pursue any further surgical intervention.  History of recent craniotomy with resection of large parasagittal meningioma-s/p left sided weakness-CT imaging continues to show vasogenic edema: Remains on steroids-Per neurosurgery-no evidence of abscess (ruled out).  Left-sided hemiplegia is stable/unchanged.  No longer on Keppra due to cognitive issues-has been switched to Vimpat.  History of urinary retention: Had Foley catheter as an outpatient which has been removed-patient voiding spontaneously-on Flomax.  History of depression: Continue Seroquel/trazodone.  Apparently on admission-had possible suicidal intent-evaluated by psychiatry-and felt not to be an imminent danger to self/others.  Not felt to require inpatient psych admission.  History of constipation: Placed on bowel regimen, has required multiple enemas and disimpaction per his request, KUB stable exam benign, bowel regimen adjusted further on 05/11/2022, continue to monitor.  Disposition issues: Lives alone-has a aide 24/7-given that he is bedbound/personality issues-family feels that they will not be able to manage him at home, extensive discussions in progress regarding possible SNF placement.  Mild anxiety.  As needed Xanax added, much improved.    DM-2 (A1c 5.7 on 5/3): CBG stable-continue SSI.  CBG (last 3)  Recent Labs    05/10/22 1543 05/10/22 2300 05/11/22 0928  GLUCAP 85 81 80    Nutrition Status: Nutrition Problem: Inadequate oral intake Etiology: chronic illness Signs/Symptoms: meal completion < 50% Interventions: MVI, Refer to RD note for recommendations  BMI: Estimated body mass index  is 27.12 kg/m as  calculated from the following:   Height as of an earlier encounter on 04/27/22: 6' (1.829 m).   Weight as of an earlier encounter on 04/27/22: 90.7 kg.   Code status:   Code Status: Full Code   DVT Prophylaxis: SCDs Start: 04/27/22 2254   Family Communication:   Daughter Patrick Buchanan [phone] on 05/09/2022 at 11:35 AM message left   Disposition Plan: Status is: Inpatient Remains inpatient appropriate because: Personality changes/encephalopathy due to residual meningioma-see above.  Awaiting SNF placement.   Planned Discharge Destination:Skilled nursing facility   Diet: Diet Order             Diet regular Room service appropriate? Yes; Fluid consistency: Thin  Diet effective now                     Antimicrobial agents: Anti-infectives (From admission, onward)    None        MEDICATIONS: Scheduled Meds:  baclofen  10 mg Oral TID   bisacodyl  10 mg Rectal Once   ciprofloxacin  1 drop Right Eye Q4H while awake   docusate sodium  200 mg Oral BID   escitalopram  20 mg Oral Daily   gabapentin  100 mg Oral BID   Gerhardt's butt cream   Topical TID   insulin aspart  0-9 Units Subcutaneous TID WC   lacosamide  100 mg Oral BID   lactulose  30 g Oral TID   lamoTRIgine  100 mg Oral BID   magnesium hydroxide  30 mL Oral BID   multivitamin with minerals  1 tablet Oral Daily   pantoprazole  40 mg Oral QHS   polyethylene glycol  17 g Oral BID   QUEtiapine  50 mg Oral QHS   rosuvastatin  20 mg Oral Daily   senna-docusate  2 tablet Oral QHS   tamsulosin  0.4 mg Oral QPC supper   Continuous Infusions:   PRN Meds:.acetaminophen **OR** acetaminophen, ALPRAZolam, bisacodyl, HYDROcodone-acetaminophen, magnesium hydroxide, polyvinyl alcohol, sodium chloride, traZODone   I have personally reviewed following labs and imaging studies  LABORATORY DATA:  Recent Labs  Lab 05/07/22 0031 05/08/22 0322 05/09/22 0509  WBC 9.0 9.1 8.7  HGB 11.6* 11.2* 12.5*  HCT 36.4* 35.6*  38.0*  PLT 310 302 326  MCV 90.1 90.6 88.2  MCH 28.7 28.5 29.0  MCHC 31.9 31.5 32.9  RDW 14.2 14.2 14.0  LYMPHSABS 1.3 1.1 1.3  MONOABS 0.8 0.9 0.8  EOSABS 0.2 0.2 0.2  BASOSABS 0.0 0.0 0.0    Recent Labs  Lab 05/07/22 0031 05/08/22 0322 05/09/22 0509  NA 142 139 140  K 3.9 3.4* 4.0  CL 104 104 106  CO2 '25 31 24  '$ GLUCOSE 105* 116* 89  BUN 14 13 7*  CREATININE 0.82 0.81 0.76  CALCIUM 9.0 8.6* 9.2  MG 2.4 2.5* 2.1   Signature  Lala Lund M.D on 05/11/2022 at 10:21 AM   -  To page go to [email or web address]

## 2022-05-12 DIAGNOSIS — G9341 Metabolic encephalopathy: Secondary | ICD-10-CM | POA: Diagnosis not present

## 2022-05-12 LAB — GLUCOSE, CAPILLARY
Glucose-Capillary: 84 mg/dL (ref 70–99)
Glucose-Capillary: 84 mg/dL (ref 70–99)
Glucose-Capillary: 82 mg/dL (ref 70–99)

## 2022-05-12 MED ORDER — POLYETHYLENE GLYCOL 3350 17 G PO PACK
17.0000 g | PACK | Freq: Two times a day (BID) | ORAL | Status: DC
  Administered 2022-05-13: 17 g via ORAL
  Filled 2022-05-12: qty 1

## 2022-05-12 NOTE — Plan of Care (Signed)

## 2022-05-12 NOTE — Progress Notes (Signed)
Resection of                        PROGRESS NOTE        PATIENT DETAILS Name: Patrick Buchanan Age: 71 y.o. Sex: male Date of Birth: 12-27-1950 Admit Date: 04/27/2022 Admitting Physician Nita Sells, MD [id number], [name], PA-C  Brief Summary: Patient is a 71 y.o.  male who is s/p status is after meningioma-with resultant left-sided hemiparesis-requiring 24/7 care by family/aides at home-presented to the hospital for bizarre behavior-he was subsequently admitted to the hospitalist service-see below for further details.  Significant events: 7/6>> admit to [facility] for personality changes/encephalopathy.  Significant studies: 7/6>> CT head: Postop changes in the right parietal convexity-rim-enhancing fluid collection containing gas-suspicious for abscess.  Numerous residual meningiomas are noted bilaterally with edema in the left frontal lobe/left parietal lobe. 7/6>> NH 4: Normal limit 7/7>> TSH: Within normal limit 7/8>> Spot EEG: No seizures 7/10>> x-ray left knee: No acute findings.  Significant microbiology data: 7/6>> urine culture: Aerococcus  Procedures: None  Consults: Neurology Neurosurgery Psychiatry  Subjective:  Patient in bed, appears comfortable, denies any headache, no fever, no chest pain or pressure, no shortness of breath , no abdominal pain. No new focal weakness.  Has had 2 bowel movements last night and does not feel constipated.  Objective: Vitals: Blood pressure 120/76, pulse 83, temperature 98.1 F (36.7 C), temperature source Oral, resp. rate 12, SpO2 100 %.   Exam:  Awake Alert, No new F.N deficits, continues to have left-sided hemiparesis, no anxiety today [place].AT,PERRAL Supple Neck, No JVD,   Symmetrical Chest wall movement, Good air movement bilaterally, CTAB RRR,No Gallops, Rubs or new Murmurs,  +ve B.Sounds, Abd Soft, No tenderness,   No Cyanosis, Clubbing or edema    Assessment/Plan:  Acute metabolic  encephalopathy/personality changes: After extensive work-up-felt to be due to residual meningiomas/steroid use/possibly Keppra (now changed to Vimpat).  He is completely awake/alert at this point.  EEG was negative for seizures.  Neurosurgery does not feel that further surgery is going to be beneficial at this point-furthermore patient does not desire to pursue any further surgical intervention.  History of recent craniotomy with resection of large parasagittal meningioma-s/p left sided weakness-CT imaging continues to show vasogenic edema: Remains on steroids-Per neurosurgery-no evidence of abscess (ruled out).  Left-sided hemiplegia is stable/unchanged.  No longer on Keppra due to cognitive issues-has been switched to Vimpat.  History of urinary retention: Had Foley catheter as an outpatient which has been removed-patient voiding spontaneously-on Flomax.  History of depression: Continue Seroquel/trazodone.  Apparently on admission-had possible suicidal intent-evaluated by psychiatry-and felt not to be an imminent danger to self/others.  Not felt to require inpatient psych admission.  History of constipation: Placed on bowel regimen, has required multiple enemas and disimpaction per his request, KUB stable exam benign, bowel regimen adjusted further on 05/11/2022, says he feels much better and having BMs, continue to monitor.  Disposition issues: Lives alone-has a aide 24/7-given that he is bedbound/personality issues-family feels that they will not be able to manage him at home, extensive discussions in progress regarding possible SNF placement.  Mild anxiety.  As needed Xanax added, much improved.    DM-2 (A1c 5.7 on 5/3): CBG stable-continue SSI.  CBG (last 3)  Recent Labs    05/11/22 1737 05/11/22 2133 05/12/22 0745  GLUCAP 80 82 84    Nutrition Status: Nutrition Problem: Inadequate oral intake Etiology: chronic illness Signs/Symptoms: meal completion < 50% Interventions: MVI,  Refer  to RD note for recommendations  BMI: Estimated body mass index is 27.12 kg/m as calculated from the following:   Height as of an earlier encounter on 04/27/22: 6' (1.829 m).   Weight as of an earlier encounter on 04/27/22: 90.7 kg.   Code status:   Code Status: Full Code   DVT Prophylaxis: SCDs Start: 04/27/22 2254   Family Communication:   Daughter [name] [phone] on 05/09/2022 at 11:35 AM message left   Disposition Plan: Status is: Inpatient Remains inpatient appropriate because: Personality changes/encephalopathy due to residual meningioma-see above.  Awaiting SNF placement.   Planned Discharge Destination:Skilled nursing facility   Diet: Diet Order             Diet regular Room service appropriate? Yes; Fluid consistency: Thin  Diet effective now                     Antimicrobial agents: Anti-infectives (From admission, onward)    None        MEDICATIONS: Scheduled Meds:  baclofen  10 mg Oral TID   ciprofloxacin  1 drop Right Eye Q4H while awake   docusate sodium  200 mg Oral BID   escitalopram  20 mg Oral Daily   gabapentin  100 mg Oral BID   insulin aspart  0-9 Units Subcutaneous TID WC   lacosamide  100 mg Oral BID   lamoTRIgine  100 mg Oral BID   multivitamin with minerals  1 tablet Oral Daily   pantoprazole  40 mg Oral QHS   polyethylene glycol  17 g Oral BID   QUEtiapine  50 mg Oral QHS   rosuvastatin  20 mg Oral Daily   senna-docusate  2 tablet Oral QHS   tamsulosin  0.4 mg Oral QPC supper   Continuous Infusions:   PRN Meds:.acetaminophen **OR** acetaminophen, ALPRAZolam, bisacodyl, HYDROcodone-acetaminophen, magnesium hydroxide, polyvinyl alcohol, sodium chloride, traZODone   I have personally reviewed following labs and imaging studies  LABORATORY DATA:  Recent Labs  Lab 05/07/22 0031 05/08/22 0322 05/09/22 0509  WBC 9.0 9.1 8.7  HGB 11.6* 11.2* 12.5*  HCT 36.4* 35.6* 38.0*  PLT 310 302 326  MCV 90.1 90.6 88.2  MCH  28.7 28.5 29.0  MCHC 31.9 31.5 32.9  RDW 14.2 14.2 14.0  LYMPHSABS 1.3 1.1 1.3  MONOABS 0.8 0.9 0.8  EOSABS 0.2 0.2 0.2  BASOSABS 0.0 0.0 0.0    Recent Labs  Lab 05/07/22 0031 05/08/22 0322 05/09/22 0509  NA 142 139 140  K 3.9 3.4* 4.0  CL 104 104 106  CO2 '25 31 24  '$ GLUCOSE 105* 116* 89  BUN 14 13 7*  CREATININE 0.82 0.81 0.76  CALCIUM 9.0 8.6* 9.2  MG 2.4 2.5* 2.1   Signature  Lala Lund M.D on 05/12/2022 at 8:47 AM   -  To page go to [email or web address]

## 2022-05-12 NOTE — Progress Notes (Signed)
Per MD, pt likely ready for dc to SNF tomorrow. Spoke to [place] at [facility] who confirmed they received payment and they are able to accept pt when ready. Albina Billet is aware pt likely ready over weekend.  Wandra Feinstein, MSW, LCSW [phone] (coverage)

## 2022-05-13 DIAGNOSIS — G9341 Metabolic encephalopathy: Secondary | ICD-10-CM | POA: Diagnosis not present

## 2022-05-13 LAB — GLUCOSE, CAPILLARY
Glucose-Capillary: 84 mg/dL (ref 70–99)
Glucose-Capillary: 90 mg/dL (ref 70–99)

## 2022-05-13 MED ORDER — TAMSULOSIN HCL 0.4 MG PO CAPS: 0.4000 mg | ORAL_CAPSULE | Freq: Every day | ORAL | Status: DC

## 2022-05-13 MED ORDER — PNEUMOCOCCAL 20-VAL CONJ VACC 0.5 ML IM SUSY: 0.5000 mL | PREFILLED_SYRINGE | INTRAMUSCULAR | Status: DC

## 2022-05-13 MED ORDER — LACOSAMIDE 100 MG PO TABS: 100.0000 mg | ORAL_TABLET | Freq: Two times a day (BID) | ORAL | 0 refills | Status: DC

## 2022-05-13 MED ORDER — BISACODYL 10 MG RE SUPP: 10.0000 mg | Freq: Every day | RECTAL | 0 refills | Status: DC | PRN

## 2022-05-13 MED ORDER — POTASSIUM CHLORIDE 2 MEQ/ML IV SOLN
INTRAVENOUS | Status: AC
  Filled 2022-05-13: qty 1000

## 2022-05-13 MED ORDER — ALPRAZOLAM 0.5 MG PO TABS: 0.5000 mg | ORAL_TABLET | Freq: Two times a day (BID) | ORAL | 0 refills | Status: DC | PRN

## 2022-05-13 MED ORDER — QUETIAPINE FUMARATE 50 MG PO TABS: 50.0000 mg | ORAL_TABLET | Freq: Every day | ORAL | Status: DC

## 2022-05-13 NOTE — Progress Notes (Signed)
Patient educated on deep tissue pressure wounds. Patient has a large 8cmx4cm pressure wound to buttocks. Patient refuses to turn on side. After a lengthy discussion regarding his wound and risk, he agreed to allow me to place a pillow under hip for off loading. Pillow under hip increased his agitation and he refused to keep pillow under hip for longer than 30 minutes. He allowed me to reposition pillow under with it marginally under hip. Patient was able to teach back the need and reasoning for offloading and turning.  Erling Conte, RN

## 2022-05-13 NOTE — Progress Notes (Signed)
Report called to [place] at [facility]. Orders reviewed, discharge summary reviewed. Questions answered to satisfaction. PTAR here to pick up pt to transport to SNF.  Jotham Ahn M

## 2022-05-13 NOTE — TOC Transition Note (Signed)
Transition of Care [facility]) - CM/SW Discharge Note   Patient Details  Name: Patrick Buchanan MRN: [id number] Date of Birth: [birth year]  Transition of Care [facility]) CM/SW Contact:  Ina Homes, [place] Phone Number: 05/13/2022, 12:58 PM   Clinical Narrative:     SW spoke with pt's daughter [name] [phone], confirmed aware of d/c to [place].   Final next level of care: Skilled Nursing Facility Barriers to Discharge: Barriers Resolved   Patient Goals and CMS Choice Patient states their goals for this hospitalization and ongoing recovery are:: Rehab CMS Medicare.gov Compare Post Acute Care list provided to:: Patient Choice offered to / list presented to : Patient, Adult Children  Discharge Placement              Patient chooses bed at: [facility] Patient to be transferred to facility by: [place] Name of family member notified: [name] Patient and family notified of of transfer: 05/13/22  Discharge Plan and Services In-house Referral: Clinical Social Work Discharge Planning Services: [employer] Consult Post Acute Care Choice: [place]: OT, PT [facility] Agency: [place] Date [place]: 04/28/22 Time [place]: 1833 Representative spoke with at [place]: Marjory Lies- active with [place]. they are aware he is here in hospital  Social Determinants of Health (SDOH) Interventions     Readmission Risk Interventions     No data to display

## 2022-05-13 NOTE — TOC Progression Note (Addendum)
Transition of Care [facility]) - Progression Note    Patient Details  Name: Patrick Buchanan MRN: [id number] Date of Birth: [birth year]  Transition of Care [facility]) CM/SW Contact  Ina Homes, [place] Phone Number: 05/13/2022, 11:23 AM  Clinical Narrative:     SW left VM with [facility] [phone])   Update 1120am SW spoke with reception at [facility], reports per their Dewaine Oats, facility is not expecting any admissions. SW explained pt is private pay and Albina Billet was informed yesterday that pt would be ready over the weekend. Facility still reports unable to accept, states can call Albina Billet.  Update 1157 SW received callback from [facility] B[facility]) reports pt can come today, she will call facility. Requested d/c summary faxed to: [phone]  Call Report: [phone] Room: 213  Expected Discharge Plan: [place] Barriers to Discharge: SNF Pending bed offer  Expected Discharge Plan and Services Expected Discharge Plan: [place] In-house Referral: Clinical Social Work Discharge Planning Services: CM Consult Post Acute Care Choice: [place] Living arrangements for the past 2 months: [place] Expected Discharge Date: 05/13/22                         HH Arranged: OT, PT HH Agency: [facility] Date HH Agency Contacted: 04/28/22 Time [place]: [id number] Representative spoke with at [place]: Marjory Lies- active with [facility]. they are aware he is here in hospital   Social Determinants of Health (SDOH) Interventions    Readmission Risk Interventions     No data to display

## 2022-05-13 NOTE — Discharge Instructions (Signed)
Follow with Primary MD Lorrene Reid, PA-C in 7 days   Get CBC, CMP, Magnesium -  checked next visit within 1 week by SNF MD   Activity: As tolerated with Full fall precautions use walker/cane & assistance as needed  Disposition SNF  Diet: Heart Healthy    Special Instructions: If you have smoked or chewed Tobacco  in the last 2 yrs please stop smoking, stop any regular Alcohol  and or any Recreational drug use.  On your next visit with your primary care physician please Get Medicines reviewed and adjusted.  Please request your Prim.MD to go over all Hospital Tests and Procedure/Radiological results at the follow up, please get all Hospital records sent to your Prim MD by signing hospital release before you go home.  If you experience worsening of your admission symptoms, develop shortness of breath, life threatening emergency, suicidal or homicidal thoughts you must seek medical attention immediately by calling 911 or calling your MD immediately  if symptoms less severe.  You Must read complete instructions/literature along with all the possible adverse reactions/side effects for all the Medicines you take and that have been prescribed to you. Take any new Medicines after you have completely understood and accpet all the possible adverse reactions/side effects.

## 2022-05-13 NOTE — Discharge Summary (Signed)
Patrick Buchanan [id number] DOB: 08-Dec-1950 DOA: 04/27/2022  PCP: Lorrene Reid, PA-C  Admit date: 04/27/2022  Discharge date: 05/13/2022  Admitted From: Home   Disposition:  SNF   Recommendations for Outpatient Follow-up:   Follow up with PCP in 1-2 weeks  PCP Please obtain BMP/CBC, 2 view CXR in 1week,  (see Discharge instructions)   PCP Please follow up on the following pending results:    Home Health: None   Equipment/Devices: None  Consultations:  Neurology Neurosurgery Psychiatry Discharge Condition: Stable    CODE STATUS: Full    Diet Recommendation: Heart Healthy     Chief Complaint  Patient presents with   Psychiatric Evaluation   Suicidal     Brief history of present illness from the day of admission and additional interim summary    71 y.o.  male who is s/p status is after meningioma-with resultant left-sided hemiparesis-requiring 24/7 care by family/aides at home-presented to the hospital for bizarre behavior-he was subsequently admitted to the hospitalist service-see below for further details.   Significant events: 7/6>> admit to [facility] for personality changes/encephalopathy.   Significant studies: 7/6>> CT head: Postop changes in the right parietal convexity-rim-enhancing fluid collection containing gas-suspicious for abscess.  Numerous residual meningiomas are noted bilaterally with edema in the left frontal lobe/left parietal lobe. 7/6>> NH 4: Normal limit 7/7>> TSH: Within normal limit 7/8>> Spot EEG: No seizures 7/10>> x-ray left knee: No acute findings.   Significant microbiology data: 7/6>> urine culture: Aerococcus   Procedures: None   Consults: Neurology Neurosurgery Psychiatry                                                                 Hospital Course    Acute metabolic encephalopathy/personality changes: After extensive work-up-felt to be due to residual meningiomas/steroid use/possibly Keppra (now changed to Vimpat).  He is completely awake/alert at this point.  EEG was negative for seizures.  Neurosurgery does not feel that further surgery is going to be beneficial at this point-furthermore patient does not desire to pursue any further surgical intervention.  Currently mental status is stable and likely now at his  new baseline.   History of recent craniotomy with resection of large parasagittal meningioma-s/p left sided weakness-CT imaging continues to show vasogenic edema: Remains on steroids-Per neurosurgery-no evidence of abscess (ruled out).  Left-sided hemiplegia is stable/unchanged.  No longer on Keppra due to cognitive issues-has been switched to Vimpat.   History of urinary retention: Had Foley catheter as an outpatient which has been removed-patient voiding spontaneously-on Flomax.   History of depression: Continue Seroquel/trazodone.  Apparently on admission-had possible suicidal intent-evaluated by psychiatry-and felt not to be an imminent danger to self/others.  Not felt to require inpatient psych admission.   History of constipation: Placed on bowel regimen, has required multiple  enemas and disimpaction per his request, KUB stable exam benign, bowel regimen adjusted further on 05/11/2022, says he feels much better and having BMs, continue to monitor at SNF with bowel regimen.   Disposition issues: Lives alone-has a aide 24/7-given that he is bedbound/personality issues-family feels that they will not be able to manage him at home, extensive discussions in progress regarding possible SNF placement.   Mild anxiety.  As needed Xanax added, much improved.     DM-2 (A1c 5.7 on 5/3): CBG stable-on diet control, check CBGs q. ACH S at SNF, ISS if needed.   Discharge diagnosis     Principal Problem:   Acute metabolic  encephalopathy Active Problems:   Diabetes mellitus (HCC)   Spastic hemiparesis of left nondominant side (HCC)   Meningioma (HCC)   Acute urinary retention   Vasogenic brain edema (HCC)    Discharge instructions    Discharge Instructions     Diet - low sodium heart healthy   Complete by: As directed    Discharge instructions   Complete by: As directed    Follow with Primary MD Lorrene Reid, PA-C in 7 days   Get CBC, CMP, Magnesium -  checked next visit within 1 week by SNF MD   Activity: As tolerated with Full fall precautions use walker/cane & assistance as needed  Disposition SNF  Diet: Heart Healthy    Special Instructions: If you have smoked or chewed Tobacco  in the last 2 yrs please stop smoking, stop any regular Alcohol  and or any Recreational drug use.  On your next visit with your primary care physician please Get Medicines reviewed and adjusted.  Please request your Prim.MD to go over all Hospital Tests and Procedure/Radiological results at the follow up, please get all Hospital records sent to your Prim MD by signing hospital release before you go home.  If you experience worsening of your admission symptoms, develop shortness of breath, life threatening emergency, suicidal or homicidal thoughts you must seek medical attention immediately by calling 911 or calling your MD immediately  if symptoms less severe.  You Must read complete instructions/literature along with all the possible adverse reactions/side effects for all the Medicines you take and that have been prescribed to you. Take any new Medicines after you have completely understood and accpet all the possible adverse reactions/side effects.   Discharge wound care:   Complete by: As directed    Wound care to left buttock pressure injury, Stage 2 (POA): Cleanse with NS, pat dry. Cover with folded layer of xeroform gauze Kellie Simmering # [id number]), top with silicone foam. Change xeroform daily, may reuse silicone foam  for up to 3 days and change PRN soiling.   Increase activity slowly   Complete by: As directed        Discharge Medications   Allergies as of 05/13/2022       Reactions   Other Itching, Other (See Comments)   Hay fever- Itchy eyes, runny nose, congestion- no breathing issues, however        Medication List     STOP taking these medications    calcium carbonate 500 MG chewable tablet Commonly known as: TUMS - dosed in mg elemental calcium   chlorpheniramine 4 MG tablet Commonly known as: CHLOR-TRIMETON   diclofenac 50 MG EC tablet Commonly known as: VOLTAREN   HYDROcodone-acetaminophen 5-325 MG tablet Commonly known as: NORCO/VICODIN   levETIRAcetam 500 MG tablet Commonly known as: KEPPRA       TAKE  these medications    ALPRAZolam 0.5 MG tablet Commonly known as: XANAX Take 1 tablet (0.5 mg total) by mouth 2 (two) times daily as needed for anxiety.   baclofen 10 MG tablet Commonly known as: LIORESAL Take 1 tablet (10 mg total) by mouth 3 (three) times daily.   bisacodyl 10 MG suppository Commonly known as: DULCOLAX Place 1 suppository (10 mg total) rectally daily as needed for moderate constipation.   diclofenac Sodium 1 % Gel Commonly known as: VOLTAREN Apply 2 g topically 3 (three) times daily.   escitalopram 20 MG tablet Commonly known as: LEXAPRO Take 1 tablet (20 mg total) by mouth daily.   gabapentin 100 MG capsule Commonly known as: NEURONTIN Take 1 capsule (100 mg total) by mouth 2 (two) times daily.   Lacosamide 100 MG Tabs Take 1 tablet (100 mg total) by mouth 2 (two) times daily.   lamoTRIgine 100 MG tablet Commonly known as: LaMICtal Take 1 tablet (100 mg total) by mouth 2 (two) times daily.   linaclotide 145 MCG Caps capsule Commonly known as: LINZESS Take 1 capsule (145 mcg total) by mouth daily before breakfast.   magnesium gluconate 500 MG tablet Commonly known as: MAGONATE Take 0.5 tablets (250 mg total) by mouth at  bedtime.   multivitamin tablet Take 1 tablet by mouth daily.   pantoprazole 40 MG tablet Commonly known as: PROTONIX Take 1 tablet (40 mg total) by mouth at bedtime.   polyvinyl alcohol 1.4 % ophthalmic solution Commonly known as: LIQUIFILM TEARS Place 1 drop into both eyes as needed for dry eyes.   QUEtiapine 50 MG tablet Commonly known as: SEROQUEL Take 1 tablet (50 mg total) by mouth at bedtime.   rosuvastatin 20 MG tablet Commonly known as: Crestor Take 1 tablet (20 mg total) by mouth daily.   senna-docusate 8.6-50 MG tablet Commonly known as: Senokot-S Take 2 tablets by mouth at bedtime.   tamsulosin 0.4 MG Caps capsule Commonly known as: FLOMAX Take 1 capsule (0.4 mg total) by mouth daily after supper.   traZODone 50 MG tablet Commonly known as: DESYREL Take 0.5-1 tablets (25-50 mg total) by mouth at bedtime as needed for sleep.   Vitamin D (Ergocalciferol) 1.25 MG (50000 UNIT) Caps capsule Commonly known as: DRISDOL Take 1 capsule (50,000 Units total) by mouth every Sunday. Take one tablet wkly   VITAMIN D3 PO Take 1 capsule by mouth daily.               Discharge Care Instructions  (From admission, onward)           Start     Ordered   05/13/22 0000  Discharge wound care:       Comments: Wound care to left buttock pressure injury, Stage 2 (POA): Cleanse with NS, pat dry. Cover with folded layer of xeroform gauze Kellie Simmering # [phone]), top with silicone foam. Change xeroform daily, may reuse silicone foam for up to 3 days and change PRN soiling.   05/13/22 0953             Contact information for follow-up providers     Lorrene Reid, PA-C. Schedule an appointment as soon as possible for a visit in 1 week(s).   Specialty: Physician Assistant Contact information: [address] [place] [address] [phone]              Contact information for after-discharge care     Destination     [facility] .  Service: Skilled Nursing Contact information: [address] [place] [phone]                     Major procedures and Radiology Reports - PLEASE review detailed and final reports thoroughly  -       DG Abd Portable 1V  Result Date: 05/08/2022 CLINICAL DATA:  71 year old male with constipation. EXAM: PORTABLE ABDOMEN - 1 VIEW COMPARISON:  05/07/2022 and earlier. FINDINGS: Portable AP supine view at 0606 hours. Decreased gastric and bowel gas in the abdomen and pelvis compared to yesterday. There seems to be less retained stool in the rectum. Bowel gas pattern approaching normal now. Visible abdominal and pelvic visceral contours within normal limits. No acute osseous abnormality identified. IMPRESSION: Improving bowel-gas pattern since yesterday, perhaps resolving ileus. Less retained stool suspected in the rectum now. Electronically Signed   By: Genevie Ann M.D.   On: 05/08/2022 06:36   DG Abd 1 View  Result Date: 05/07/2022 CLINICAL DATA:  Constipation. EXAM: ABDOMEN - 1 VIEW COMPARISON:  Radiograph 03/23/2022 FINDINGS: There is gaseous gastric distension. Gaseous colonic distension. Moderate stool in the rectum, there is otherwise a paucity of formed stool stool. No visible radiopaque calculi. IMPRESSION: Gaseous gastric and colonic distension, may represent ileus. Moderate stool in the rectum, there is otherwise a paucity of formed stool. Electronically Signed   By: Keith Rake M.D.   On: 05/07/2022 20:20   DG Knee Left Port  Result Date: 05/01/2022 CLINICAL DATA:  Pain EXAM: PORTABLE LEFT KNEE - 1-2 VIEW COMPARISON:  None Available. FINDINGS: Osseous alignment is normal. Bone mineralization is normal. No acute or suspicious osseous finding. Faint calcific densities are seen within the medial and lateral patellofemoral compartments and along the lateral joint lines suggesting chondrocalcinosis. There is mild degenerative narrowing of the medial  and lateral compartments. No large osteophytes or other signs of advanced osteoarthritis. No appreciable joint effusion. Prominent vessels within the soft tissues adjacent to the knee and proximal tibia, presumably varicose veins. Superficial soft tissues are otherwise unremarkable. IMPRESSION: 1. Mild degenerative narrowing of the medial and lateral compartments. Probable underlying CPPD. No large osteophytes or other signs of advanced osteoarthritis. 2. No acute findings.  No evidence of joint effusion. 3. Prominent vessels within the soft tissues adjacent to the knee and proximal tibia, presumably varicose veins. Electronically Signed   By: Franki Cabot M.D.   On: 05/01/2022 10:22   EEG adult  Result Date: 04/29/2022 Derek Jack, MD     04/29/2022  7:15 PM Routine EEG Report Patrick Buchanan is a 71 y.o. male with a history of multiple meningiomas and possible brain abscess who is undergoing an EEG to evaluate for seizures. Report: This EEG was acquired with electrodes placed according to the International 10-20 electrode system (including Fp1, Fp2, F3, F4, C3, C4, P3, P4, O1, O2, T3, T4, T5, T6, A1, A2, Fz, Cz, Pz). The following electrodes were missing or displaced: none. The occipital dominant rhythm was 8.5 Hz. This activity is reactive to stimulation. Drowsiness was manifested by background fragmentation; deeper stages of sleep were not identified. There was focal slowing over the left frontal region. There were no interictal epileptiform discharges. There were no electrographic seizures identified. Photic stimulation and hyperventilation were not performed. Impression and clinical correlation: This EEG was obtained while awake and drowsy and is abnormal due to focal slowing over the left frontal region. Su Monks, MD [facility] [phone] If 7pm- 7am, please  page neurology on call as listed in [place].   CT Head W or Wo Contrast  Result Date: 04/27/2022 CLINICAL DATA:   Delirium. Recent craniotomy for meningioma resection. EXAM: CT HEAD WITHOUT AND WITH CONTRAST TECHNIQUE: Contiguous axial images were obtained from the base of the skull through the vertex without and with intravenous contrast. RADIATION DOSE REDUCTION: This exam was performed according to the departmental dose-optimization program which includes automated exposure control, adjustment of the mA and/or kV according to patient size and/or use of iterative reconstruction technique. CONTRAST:  29m OMNIPAQUE IOHEXOL 300 MG/ML  SOLN COMPARISON:  CT head 02/28/2022 FINDINGS: Brain: Postop right convexity craniotomy for tumor resection. Large enhancing lesion in the right parietal convexity has been resected. There is irregular rim enhancement of a fluid collection at the surgical site which could represent abscess. This contains gas and appears extra-axial. Multiple additional enhancing masses compatible with meningioma. These are stable from prior studies. There is edema in the left frontal lobe and left parietal lobe due to enhancing mass lesions. Ventricle size normal. 5 mm midline shift to the right due to mass-effect. Vascular: Aneurysm clipping in the right posterior communicating artery region. Skull: Right convexity craniotomy.  Right temporal craniotomy. Sinuses/Orbits: Paranasal sinuses clear.  Negative orbit Other: None IMPRESSION: Postop right parietal convexity craniotomy for tumor resection. There is a rim enhancing fluid collection containing gas at the surgical site, suspicious for abscess. This measures approximately 25 x 13 mm. Numerous residual enhancing meningioma are noted bilaterally. There is edema in the left frontal lobe left parietal lobe with mild midline shift to the right. These results were called by telephone at the time of interpretation on 04/27/2022 at 8:00 pm to [facility], who verbally acknowledged these results. Electronically Signed   By: CFranchot GalloM.D.   On:  04/27/2022 20:00     Today   Subjective    JTunis Gentletoday has no headache,no chest abdominal pain,no new weakness tingling or numbness, feels much better wants to go home today.     Objective   Blood pressure 123/65, pulse 71, temperature (!) 97.5 F (36.4 C), temperature source Axillary, resp. rate 17, SpO2 98 %.   Intake/Output Summary (Last 24 hours) at 05/13/2022 0953 Last data filed at 05/13/2022 03662Gross per 24 hour  Intake 400 ml  Output 900 ml  Net -500 ml    Exam  Awake Alert, No new F.N deficits,    [place].AT,PERRAL Supple Neck,   Symmetrical Chest wall movement, Good air movement bilaterally, CTAB RRR,No Gallops,   +ve B.Sounds, Abd Soft, Non tender,  No Cyanosis, Clubbing or edema    Data Review   Recent Labs  Lab 05/07/22 0031 05/08/22 0322 05/09/22 0509  WBC 9.0 9.1 8.7  HGB 11.6* 11.2* 12.5*  HCT 36.4* 35.6* 38.0*  PLT 310 302 326  MCV 90.1 90.6 88.2  MCH 28.7 28.5 29.0  MCHC 31.9 31.5 32.9  RDW 14.2 14.2 14.0  LYMPHSABS 1.3 1.1 1.3  MONOABS 0.8 0.9 0.8  EOSABS 0.2 0.2 0.2  BASOSABS 0.0 0.0 0.0    Recent Labs  Lab 05/07/22 0031 05/08/22 0322 05/09/22 0509  NA 142 139 140  K 3.9 3.4* 4.0  CL 104 104 106  CO2 '25 31 24  '$ GLUCOSE 105* 116* 89  BUN 14 13 7*  CREATININE 0.82 0.81 0.76  CALCIUM 9.0 8.6* 9.2  MG 2.4 2.5* 2.1    Total Time in preparing paper work, data evaluation and todays  exam - 35 minutes  Lala Lund M.D on 05/13/2022 at 9:53 AM  [facility]

## 2022-05-14 ENCOUNTER — Emergency Department (HOSPITAL_BASED_OUTPATIENT_CLINIC_OR_DEPARTMENT_OTHER)
Admission: EM | Admit: 2022-05-14 | Discharge: 2022-05-14 | Disposition: A | Discharge status: Skilled Nursing Facility | Payer: Medicare HMO | Attending: Emergency Medicine | Admitting: Emergency Medicine

## 2022-05-14 ENCOUNTER — Other Ambulatory Visit: Payer: Self-pay

## 2022-05-14 ENCOUNTER — Encounter (HOSPITAL_BASED_OUTPATIENT_CLINIC_OR_DEPARTMENT_OTHER): Payer: Self-pay

## 2022-05-14 DIAGNOSIS — X58XXXA Exposure to other specified factors, initial encounter: Secondary | ICD-10-CM | POA: Insufficient documentation

## 2022-05-14 DIAGNOSIS — S0502XA Injury of conjunctiva and corneal abrasion without foreign body, left eye, initial encounter: Secondary | ICD-10-CM | POA: Insufficient documentation

## 2022-05-14 DIAGNOSIS — Z79899 Other long term (current) drug therapy: Secondary | ICD-10-CM | POA: Diagnosis not present

## 2022-05-14 DIAGNOSIS — R531 Weakness: Secondary | ICD-10-CM | POA: Diagnosis not present

## 2022-05-14 DIAGNOSIS — F69 Unspecified disorder of adult personality and behavior: Secondary | ICD-10-CM | POA: Diagnosis not present

## 2022-05-14 DIAGNOSIS — M79643 Pain in unspecified hand: Secondary | ICD-10-CM | POA: Diagnosis not present

## 2022-05-14 DIAGNOSIS — Z7401 Bed confinement status: Secondary | ICD-10-CM | POA: Diagnosis not present

## 2022-05-14 DIAGNOSIS — S0592XA Unspecified injury of left eye and orbit, initial encounter: Secondary | ICD-10-CM | POA: Diagnosis present

## 2022-05-14 MED ORDER — CIPROFLOXACIN HCL 0.3 % OP SOLN: 1.0000 [drp] | OPHTHALMIC | 0 refills | Status: AC

## 2022-05-14 NOTE — ED Provider Notes (Signed)
[facility] EMERGENCY DEPT Provider Note   CSN: [id number] Arrival date & time: 05/14/22  1329     History  Chief Complaint  Patient presents with   Eye Pain    Patrick Buchanan is a 71 y.o. male.  Patient complains of pain to his left eye.  Patient reports he may have scratched his eye.  Patient reports the nurse at the facility where he resides washed his eye out.  He reports that gave him some initial relief  The history is provided by the patient. No language interpreter was used.  Eye Pain This is a new problem. The problem occurs constantly. The problem has not changed since onset.Nothing aggravates the symptoms. Nothing relieves the symptoms. He has tried nothing for the symptoms.       Home Medications Prior to Admission medications   Medication Sig Start Date End Date Taking? Authorizing Provider  ciprofloxacin (CILOXAN) 0.3 % ophthalmic solution Place 1 drop into both eyes every 2 (two) hours for 5 days. Administer 1 drop, every 2 hours, while awake, for 2 days. Then 1 drop, every 4 hours, while awake, for the next 5 days. 05/14/22 05/19/22 Yes Fransico Meadow, PA-C  ALPRAZolam Duanne Moron) 0.5 MG tablet Take 1 tablet (0.5 mg total) by mouth 2 (two) times daily as needed for anxiety. 05/13/22   Thurnell Lose, MD  baclofen (LIORESAL) 10 MG tablet Take 1 tablet (10 mg total) by mouth 3 (three) times daily. 04/24/22   Ronnell Freshwater, NP  bisacodyl (DULCOLAX) 10 MG suppository Place 1 suppository (10 mg total) rectally daily as needed for moderate constipation. 05/13/22   Thurnell Lose, MD  Cholecalciferol (VITAMIN D3 PO) Take 1 capsule by mouth daily.    [provider]  diclofenac Sodium (VOLTAREN) 1 % GEL Apply 2 g topically 3 (three) times daily. 03/30/22   Setzer, Edman [address], PA-C  escitalopram (LEXAPRO) 20 MG tablet Take 1 tablet (20 mg total) by mouth daily. 04/24/22   Ronnell Freshwater, NP  gabapentin (NEURONTIN) 100 MG capsule Take 1 capsule (100  mg total) by mouth 2 (two) times daily. 04/24/22   Ronnell Freshwater, NP  lacosamide 100 MG TABS Take 1 tablet (100 mg total) by mouth 2 (two) times daily. 05/13/22   Thurnell Lose, MD  lamoTRIgine (LAMICTAL) 100 MG tablet Take 1 tablet (100 mg total) by mouth 2 (two) times daily. 04/24/22   Ronnell Freshwater, NP  linaclotide (LINZESS) 145 MCG CAPS capsule Take 1 capsule (145 mcg total) by mouth daily before breakfast. 04/24/22   Ronnell Freshwater, NP  magnesium gluconate (MAGONATE) 500 MG tablet Take 0.5 tablets (250 mg total) by mouth at bedtime. 04/24/22   Ronnell Freshwater, NP  Multiple Vitamin (MULTIVITAMIN) tablet Take 1 tablet by mouth daily.    [provider]  pantoprazole (PROTONIX) 40 MG tablet Take 1 tablet (40 mg total) by mouth at bedtime. 04/24/22   Ronnell Freshwater, NP  polyvinyl alcohol (LIQUIFILM TEARS) 1.4 % ophthalmic solution Place 1 drop into both eyes as needed for dry eyes.    [provider]  QUEtiapine (SEROQUEL) 50 MG tablet Take 1 tablet (50 mg total) by mouth at bedtime. 05/13/22   Thurnell Lose, MD  rosuvastatin (CRESTOR) 20 MG tablet Take 1 tablet (20 mg total) by mouth daily. 04/24/22   Ronnell Freshwater, NP  senna-docusate (SENOKOT-S) 8.6-50 MG tablet Take 2 tablets by mouth at bedtime. 03/30/22   Risa Grill  J, PA-C  tamsulosin (FLOMAX) 0.4 MG CAPS capsule Take 1 capsule (0.4 mg total) by mouth daily after supper. 05/13/22   Thurnell Lose, MD  traZODone (DESYREL) 50 MG tablet Take 0.5-1 tablets (25-50 mg total) by mouth at bedtime as needed for sleep. 04/24/22   Ronnell Freshwater, NP  Vitamin D, Ergocalciferol, (DRISDOL) 1.25 MG (50000 UNIT) CAPS capsule Take 1 capsule (50,000 Units total) by mouth every 'Sunday. Take one tablet wkly 04/30/22   Boscia, Heather E, NP      Allergies    Other    Review of Systems   Review of Systems  Eyes:  Positive for pain.  All other systems reviewed and are negative.   Physical Exam Updated Vital Signs BP  117/79 (BP Location: Right Arm)   Pulse 95   Temp 98.4 F (36.9 C) (Oral)   Resp 15   SpO2 96%  Physical Exam Vitals reviewed.  Constitutional:      Appearance: Normal appearance.  HENT:     Nose: Nose normal.     Mouth/Throat:     Mouth: Mucous membranes are moist.  Eyes:     Extraocular Movements: Extraocular movements intact.     Pupils: Pupils are equal, round, and reactive to light.     Comments: Fluoroscopy shows small area of uptake at 1:00.  Tono-Pen pressure 15 Swabbed upper and lower eyelid no sign of foreign body  Skin:    General: Skin is warm.  Neurological:     General: No focal deficit present.     Mental Status: He is alert.  Psychiatric:        Mood and Affect: Mood normal.     ED Results / Procedures / Treatments   Labs (all labs ordered are listed, but only abnormal results are displayed) Labs Reviewed - No data to display  EKG None  Radiology No results found.  Procedures Procedures    Medications Ordered in ED Medications - No data to display  ED Course/ Medical Decision Making/ A&P                           Medical Decision Making Patient is currently and rehab after resection of a meningioma.  He reports he thinks something scratched his left eye  Amount and/or Complexity of Data Reviewed External Data Reviewed: notes.    Details: Hospital notes reviewed from admission rehab notes reviewed  Risk Prescription drug management. Risk Details: Patient is given a prescription for Ciloxan eyedrops.  He is advised to return if any problems           Final Clinical Impression(s) / ED Diagnoses Final diagnoses:  Abrasion of left cornea, initial encounter    Rx / DC Orders ED Discharge Orders          Ordered    ciprofloxacin (CILOXAN) 0.3 % ophthalmic solution  Every 2 hours        07'$ /23/23 1501           An After Visit Summary was printed and given to the patient.    Fransico Meadow, PA-C 05/14/22 1508     Regan Lemming, MD 05/14/22 [phone]

## 2022-05-14 NOTE — Discharge Instructions (Addendum)
May use previously prescribed eye drops to left eye

## 2022-05-14 NOTE — ED Triage Notes (Signed)
Pt arrives via [facility] from [place] with left eye pain.  Pt says he feels like there is something in his eye.  He denies any vision loss or double vision.  Reports some blurred vision.  Pt A&Ox4.  GCS 15.

## 2022-05-14 NOTE — ED Notes (Signed)
[facility] called for pick up.

## 2022-05-14 NOTE — ED Notes (Signed)
Pt requesting to know how long before he sees MD, asks if MD can order something that he can put in his left eye.  Explained to pt that MD has to examine his eye prior to providing any orders.

## 2022-05-14 NOTE — ED Notes (Signed)
Pt requesting to have BP cuff and pulse ox probe removed, stating it prevents him from bending his arm.  Requests to  have the equipment placed each time vitals are needed.

## 2022-05-15 DIAGNOSIS — G40909 Epilepsy, unspecified, not intractable, without status epilepticus: Secondary | ICD-10-CM | POA: Diagnosis not present

## 2022-05-15 DIAGNOSIS — F32A Depression, unspecified: Secondary | ICD-10-CM | POA: Diagnosis not present

## 2022-05-15 DIAGNOSIS — K5901 Slow transit constipation: Secondary | ICD-10-CM | POA: Diagnosis not present

## 2022-05-15 DIAGNOSIS — G936 Cerebral edema: Secondary | ICD-10-CM | POA: Diagnosis not present

## 2022-05-15 DIAGNOSIS — R1310 Dysphagia, unspecified: Secondary | ICD-10-CM | POA: Diagnosis not present

## 2022-05-15 DIAGNOSIS — E118 Type 2 diabetes mellitus with unspecified complications: Secondary | ICD-10-CM | POA: Diagnosis not present

## 2022-05-15 DIAGNOSIS — G8194 Hemiplegia, unspecified affecting left nondominant side: Secondary | ICD-10-CM | POA: Diagnosis not present

## 2022-05-15 DIAGNOSIS — Z87898 Personal history of other specified conditions: Secondary | ICD-10-CM | POA: Diagnosis not present

## 2022-05-15 DIAGNOSIS — M6281 Muscle weakness (generalized): Secondary | ICD-10-CM | POA: Diagnosis not present

## 2022-05-15 DIAGNOSIS — D329 Benign neoplasm of meninges, unspecified: Secondary | ICD-10-CM | POA: Diagnosis not present

## 2022-05-15 DIAGNOSIS — G9340 Encephalopathy, unspecified: Secondary | ICD-10-CM | POA: Diagnosis not present

## 2022-05-15 DIAGNOSIS — G9341 Metabolic encephalopathy: Secondary | ICD-10-CM | POA: Diagnosis not present

## 2022-05-15 DIAGNOSIS — H109 Unspecified conjunctivitis: Secondary | ICD-10-CM | POA: Diagnosis not present

## 2022-05-16 DIAGNOSIS — G936 Cerebral edema: Secondary | ICD-10-CM | POA: Diagnosis not present

## 2022-05-16 DIAGNOSIS — M6281 Muscle weakness (generalized): Secondary | ICD-10-CM | POA: Diagnosis not present

## 2022-05-16 DIAGNOSIS — G9341 Metabolic encephalopathy: Secondary | ICD-10-CM | POA: Diagnosis not present

## 2022-05-16 DIAGNOSIS — R1310 Dysphagia, unspecified: Secondary | ICD-10-CM | POA: Diagnosis not present

## 2022-05-17 DIAGNOSIS — G936 Cerebral edema: Secondary | ICD-10-CM | POA: Diagnosis not present

## 2022-05-17 DIAGNOSIS — G8110 Spastic hemiplegia affecting unspecified side: Secondary | ICD-10-CM | POA: Diagnosis not present

## 2022-05-17 DIAGNOSIS — G9341 Metabolic encephalopathy: Secondary | ICD-10-CM | POA: Diagnosis not present

## 2022-05-17 DIAGNOSIS — R1310 Dysphagia, unspecified: Secondary | ICD-10-CM | POA: Diagnosis not present

## 2022-05-17 DIAGNOSIS — M6281 Muscle weakness (generalized): Secondary | ICD-10-CM | POA: Diagnosis not present

## 2022-05-17 DIAGNOSIS — S43002A Unspecified subluxation of left shoulder joint, initial encounter: Secondary | ICD-10-CM | POA: Diagnosis not present

## 2022-05-17 DIAGNOSIS — F639 Impulse disorder, unspecified: Secondary | ICD-10-CM | POA: Diagnosis not present

## 2022-05-18 DIAGNOSIS — G9341 Metabolic encephalopathy: Secondary | ICD-10-CM | POA: Diagnosis not present

## 2022-05-18 DIAGNOSIS — R1310 Dysphagia, unspecified: Secondary | ICD-10-CM | POA: Diagnosis not present

## 2022-05-18 DIAGNOSIS — G936 Cerebral edema: Secondary | ICD-10-CM | POA: Diagnosis not present

## 2022-05-18 DIAGNOSIS — F331 Major depressive disorder, recurrent, moderate: Secondary | ICD-10-CM | POA: Diagnosis not present

## 2022-05-18 DIAGNOSIS — M6281 Muscle weakness (generalized): Secondary | ICD-10-CM | POA: Diagnosis not present

## 2022-05-19 DIAGNOSIS — R1310 Dysphagia, unspecified: Secondary | ICD-10-CM | POA: Diagnosis not present

## 2022-05-19 DIAGNOSIS — R109 Unspecified abdominal pain: Secondary | ICD-10-CM | POA: Diagnosis not present

## 2022-05-19 DIAGNOSIS — G9341 Metabolic encephalopathy: Secondary | ICD-10-CM | POA: Diagnosis not present

## 2022-05-19 DIAGNOSIS — M6281 Muscle weakness (generalized): Secondary | ICD-10-CM | POA: Diagnosis not present

## 2022-05-19 DIAGNOSIS — G936 Cerebral edema: Secondary | ICD-10-CM | POA: Diagnosis not present

## 2022-05-22 DIAGNOSIS — G936 Cerebral edema: Secondary | ICD-10-CM | POA: Diagnosis not present

## 2022-05-22 DIAGNOSIS — M6281 Muscle weakness (generalized): Secondary | ICD-10-CM | POA: Diagnosis not present

## 2022-05-22 DIAGNOSIS — R1310 Dysphagia, unspecified: Secondary | ICD-10-CM | POA: Diagnosis not present

## 2022-05-22 DIAGNOSIS — G9341 Metabolic encephalopathy: Secondary | ICD-10-CM | POA: Diagnosis not present

## 2022-05-23 ENCOUNTER — Encounter (HOSPITAL_COMMUNITY): Payer: Self-pay

## 2022-05-23 ENCOUNTER — Ambulatory Visit (HOSPITAL_COMMUNITY): Payer: Medicare HMO

## 2022-05-23 DIAGNOSIS — G936 Cerebral edema: Secondary | ICD-10-CM | POA: Diagnosis not present

## 2022-05-23 DIAGNOSIS — G9341 Metabolic encephalopathy: Secondary | ICD-10-CM | POA: Diagnosis not present

## 2022-05-23 DIAGNOSIS — M6281 Muscle weakness (generalized): Secondary | ICD-10-CM | POA: Diagnosis not present

## 2022-05-23 DIAGNOSIS — R1310 Dysphagia, unspecified: Secondary | ICD-10-CM | POA: Diagnosis not present

## 2022-05-24 DIAGNOSIS — M6281 Muscle weakness (generalized): Secondary | ICD-10-CM | POA: Diagnosis not present

## 2022-05-24 DIAGNOSIS — G936 Cerebral edema: Secondary | ICD-10-CM | POA: Diagnosis not present

## 2022-05-24 DIAGNOSIS — R1310 Dysphagia, unspecified: Secondary | ICD-10-CM | POA: Diagnosis not present

## 2022-05-24 DIAGNOSIS — G9341 Metabolic encephalopathy: Secondary | ICD-10-CM | POA: Diagnosis not present

## 2022-05-25 DIAGNOSIS — R1310 Dysphagia, unspecified: Secondary | ICD-10-CM | POA: Diagnosis not present

## 2022-05-25 DIAGNOSIS — G936 Cerebral edema: Secondary | ICD-10-CM | POA: Diagnosis not present

## 2022-05-25 DIAGNOSIS — G9341 Metabolic encephalopathy: Secondary | ICD-10-CM | POA: Diagnosis not present

## 2022-05-25 DIAGNOSIS — M6281 Muscle weakness (generalized): Secondary | ICD-10-CM | POA: Diagnosis not present

## 2022-05-26 DIAGNOSIS — M6281 Muscle weakness (generalized): Secondary | ICD-10-CM | POA: Diagnosis not present

## 2022-05-26 DIAGNOSIS — R1310 Dysphagia, unspecified: Secondary | ICD-10-CM | POA: Diagnosis not present

## 2022-05-26 DIAGNOSIS — G9341 Metabolic encephalopathy: Secondary | ICD-10-CM | POA: Diagnosis not present

## 2022-05-26 DIAGNOSIS — G936 Cerebral edema: Secondary | ICD-10-CM | POA: Diagnosis not present

## 2022-05-28 ENCOUNTER — Emergency Department (HOSPITAL_BASED_OUTPATIENT_CLINIC_OR_DEPARTMENT_OTHER)
Admission: EM | Admit: 2022-05-28 | Discharge: 2022-05-28 | Disposition: A | Discharge status: Home / Self Care | Payer: Medicare HMO | Attending: Emergency Medicine | Admitting: Emergency Medicine

## 2022-05-28 ENCOUNTER — Other Ambulatory Visit: Payer: Self-pay

## 2022-05-28 ENCOUNTER — Encounter (HOSPITAL_BASED_OUTPATIENT_CLINIC_OR_DEPARTMENT_OTHER): Payer: Self-pay | Admitting: Emergency Medicine

## 2022-05-28 ENCOUNTER — Emergency Department (HOSPITAL_BASED_OUTPATIENT_CLINIC_OR_DEPARTMENT_OTHER): Payer: Medicare HMO

## 2022-05-28 DIAGNOSIS — G8190 Hemiplegia, unspecified affecting unspecified side: Secondary | ICD-10-CM | POA: Diagnosis not present

## 2022-05-28 DIAGNOSIS — R531 Weakness: Secondary | ICD-10-CM | POA: Diagnosis not present

## 2022-05-28 DIAGNOSIS — Z0389 Encounter for observation for other suspected diseases and conditions ruled out: Secondary | ICD-10-CM | POA: Diagnosis not present

## 2022-05-28 DIAGNOSIS — R070 Pain in throat: Secondary | ICD-10-CM | POA: Diagnosis not present

## 2022-05-28 DIAGNOSIS — Z7401 Bed confinement status: Secondary | ICD-10-CM | POA: Diagnosis not present

## 2022-05-28 MED ORDER — LIDOCAINE VISCOUS HCL 2 % MT SOLN: 15.0000 mL | Freq: Once | OROMUCOSAL | Status: DC

## 2022-05-28 NOTE — Discharge Instructions (Addendum)
Diagnosed with throat discomfort.  It is possible that you may have had some sort of food trapped in your throat but I cannot visualize that on exam.  Chest x-ray did not reveal any radiopaque evidence of foreign body.  And also reassuring that you are able to drink a soda without any issues and are able to swallow with no issues.  Recommend continued oral hydration and follow-up with PCP.

## 2022-05-28 NOTE — ED Triage Notes (Signed)
In triage, patient states he no longer feels something is in his throat it has moved to his nose. Sats and vss ,pt is talking on the phone as I triage.

## 2022-05-28 NOTE — ED Triage Notes (Signed)
From [place] by Corey Harold

## 2022-05-28 NOTE — ED Provider Notes (Signed)
[facility] Provider Note   CSN: [id number] Arrival date & time: 05/28/22  1848     History  No chief complaint on file.  HPI Patrick Buchanan is a 71 y.o. male  with PMH of meningioma presenting for foreign body.  Patient stated that he had a hamburger tonight for dinner and shortly after felt as though something was stuck in the back of his throat.  He believes it was a piece of hamburger.  He is convinced that it was somewhere in the pelvis throat but also stated that it could be in the front of his mouth as well.  Denies trouble swallowing or breathing.  Reports that drinking fluid helps his symptoms.  He was also able to eat a banana and reports that he had no issues.     Home Medications Prior to Admission medications   Medication Sig Start Date End Date Taking? Authorizing Provider  ALPRAZolam Duanne Moron) 0.5 MG tablet Take 1 tablet (0.5 mg total) by mouth 2 (two) times daily as needed for anxiety. 05/13/22   Thurnell Lose, MD  baclofen (LIORESAL) 10 MG tablet Take 1 tablet (10 mg total) by mouth 3 (three) times daily. 04/24/22   Ronnell Freshwater, NP  bisacodyl (DULCOLAX) 10 MG suppository Place 1 suppository (10 mg total) rectally daily as needed for moderate constipation. 05/13/22   Thurnell Lose, MD  Cholecalciferol (VITAMIN D3 PO) Take 1 capsule by mouth daily.    [provider]  diclofenac Sodium (VOLTAREN) 1 % GEL Apply 2 g topically 3 (three) times daily. 03/30/22   Setzer, [address] Circle, PA-C  escitalopram (LEXAPRO) 20 MG tablet Take 1 tablet (20 mg total) by mouth daily. 04/24/22   Ronnell Freshwater, NP  gabapentin (NEURONTIN) 100 MG capsule Take 1 capsule (100 mg total) by mouth 2 (two) times daily. 04/24/22   Ronnell Freshwater, NP  lacosamide 100 MG TABS Take 1 tablet (100 mg total) by mouth 2 (two) times daily. 05/13/22   Thurnell Lose, MD  lamoTRIgine (LAMICTAL) 100 MG tablet Take 1 tablet (100 mg total) by mouth 2 (two) times daily.  04/24/22   Ronnell Freshwater, NP  linaclotide (LINZESS) 145 MCG CAPS capsule Take 1 capsule (145 mcg total) by mouth daily before breakfast. 04/24/22   Ronnell Freshwater, NP  magnesium gluconate (MAGONATE) 500 MG tablet Take 0.5 tablets (250 mg total) by mouth at bedtime. 04/24/22   Ronnell Freshwater, NP  Multiple Vitamin (MULTIVITAMIN) tablet Take 1 tablet by mouth daily.    [provider]  pantoprazole (PROTONIX) 40 MG tablet Take 1 tablet (40 mg total) by mouth at bedtime. 04/24/22   Ronnell Freshwater, NP  polyvinyl alcohol (LIQUIFILM TEARS) 1.4 % ophthalmic solution Place 1 drop into both eyes as needed for dry eyes.    [provider]  QUEtiapine (SEROQUEL) 50 MG tablet Take 1 tablet (50 mg total) by mouth at bedtime. 05/13/22   Thurnell Lose, MD  rosuvastatin (CRESTOR) 20 MG tablet Take 1 tablet (20 mg total) by mouth daily. 04/24/22   Ronnell Freshwater, NP  senna-docusate (SENOKOT-S) 8.6-50 MG tablet Take 2 tablets by mouth at bedtime. 03/30/22   Setzer, Edman Circle, PA-C  tamsulosin (FLOMAX) 0.4 MG CAPS capsule Take 1 capsule (0.4 mg total) by mouth daily after supper. 05/13/22   Thurnell Lose, MD  traZODone (DESYREL) 50 MG tablet Take 0.5-1 tablets (25-50 mg total) by mouth at bedtime as needed for  sleep. 04/24/22   Ronnell Freshwater, NP  Vitamin D, Ergocalciferol, (DRISDOL) 1.25 MG (50000 UNIT) CAPS capsule Take 1 capsule (50,000 Units total) by mouth every Sunday. Take one tablet wkly 04/30/22   Ronnell Freshwater, NP      Allergies    Other    Review of Systems   Review of Systems  HENT:  Negative for sore throat and trouble swallowing.        Throat foreign body    Physical Exam Updated Vital Signs BP 126/76   Pulse 97   Resp 16   SpO2 100%  Physical Exam Vitals and nursing note reviewed.  HENT:     Head: Normocephalic and atraumatic.     Mouth/Throat:     Lips: Pink.     Mouth: Mucous membranes are moist.     Dentition: Normal dentition. No dental abscesses.      Tongue: No lesions. Tongue does not deviate from midline.     Pharynx: Oropharynx is clear. Uvula midline.     Tonsils: No tonsillar exudate or tonsillar abscesses.  Eyes:     General:        Right eye: No discharge.        Left eye: No discharge.     Conjunctiva/sclera: Conjunctivae normal.  Cardiovascular:     Rate and Rhythm: Normal rate and regular rhythm.     Pulses: Normal pulses.     Heart sounds: Normal heart sounds.  Pulmonary:     Effort: Pulmonary effort is normal.     Breath sounds: Normal breath sounds.  Abdominal:     General: Abdomen is flat.     Palpations: Abdomen is soft.  Skin:    General: Skin is warm and dry.  Neurological:     General: No focal deficit present.  Psychiatric:        Mood and Affect: Mood normal.     ED Results / Procedures / Treatments   Labs (all labs ordered are listed, but only abnormal results are displayed) Labs Reviewed - No data to display  EKG None  Radiology DG Neck Soft Tissue  Result Date: 05/28/2022 CLINICAL DATA:  Foreign body in throat. Piece of hamburger is caught in throat. EXAM: NECK SOFT TISSUES - 1+ VIEW COMPARISON:  None Available. FINDINGS: There is no evidence of retropharyngeal soft tissue swelling or epiglottic enlargement. The cervical airway is unremarkable. No radio-opaque foreign body identified. No evidence of pneumomediastinum in the neck. Soft tissue planes are non suspicious. IMPRESSION: Negative soft tissue neck radiographs.  No radiopaque foreign body. Electronically Signed   By: Keith Rake M.D.   On: 05/28/2022 21:06    Medications Ordered in ED Medications - No data to display  ED Course/ Medical Decision Making/ A&P  Medical Decision Making  This patient presents to the ED for concern of foreign body in the throat, this involves a number of treatment options, and is a complaint that carries with it a high risk of complications and morbidity.  The differential diagnosis includes throat  foreign body, peritonsillar abscess, food bolus, and psychogenic.  Considered foreign body but unlikely given benign mouth and throat exam and negative for soft tissue x-ray of the neck.  Peritonsillar abscess also unlikely given normal mouth and throat exam.  At this time, cannot rule out food bolus as possible etiology but patient denies any trouble swallowing or breathing.  Primary concern is likely psychogenic given that patient states that fluid bolus was at one  time in the back of his throat but then migrated to the front of his mouth and at one time in the upper lip.  Offered viscous lidocaine for symptoms but patient deferred.  Offered Ativan for anxiety but patient also deferred stating that it would impact his "mental clarity".  PO challenge was normal and he had no issues drinking an entire can of soda.  At this time, vitals are normal and there are no concerns for foreign body leading to respiratory distress or trouble swallowing.  Appropriate to discharge home with follow-up with PCP.         Final Clinical Impression(s) / ED Diagnoses Final diagnoses:  Throat discomfort    Rx / DC Orders ED Discharge Orders     None         Harriet Pho, PA-C 48/25/00 [id number]    Campbell Stall P, DO 88/89/16 [id number]

## 2022-05-28 NOTE — ED Triage Notes (Signed)
Pt called 911 says something is stuck in throat.pt was able to talk, sats 96%the patient very anxious. Left side hemiparesis is his baseline. Vss. 136/68,heart 92,99% ra

## 2022-05-28 NOTE — ED Notes (Signed)
Called EMS for [facility] transportation at 1009pm, Pt is next on list.

## 2022-05-29 ENCOUNTER — Emergency Department (HOSPITAL_COMMUNITY): Payer: Medicare HMO

## 2022-05-29 ENCOUNTER — Emergency Department (HOSPITAL_COMMUNITY)
Admission: EM | Admit: 2022-05-29 | Discharge: 2022-05-29 | Disposition: A | Discharge status: Skilled Nursing Facility | Payer: Medicare HMO | Attending: Emergency Medicine | Admitting: Emergency Medicine

## 2022-05-29 ENCOUNTER — Other Ambulatory Visit: Payer: Self-pay

## 2022-05-29 ENCOUNTER — Encounter (HOSPITAL_COMMUNITY): Payer: Self-pay

## 2022-05-29 DIAGNOSIS — D329 Benign neoplasm of meninges, unspecified: Secondary | ICD-10-CM | POA: Diagnosis not present

## 2022-05-29 DIAGNOSIS — R4587 Impulsiveness: Secondary | ICD-10-CM | POA: Diagnosis not present

## 2022-05-29 DIAGNOSIS — M5136 Other intervertebral disc degeneration, lumbar region: Secondary | ICD-10-CM | POA: Diagnosis not present

## 2022-05-29 DIAGNOSIS — R6889 Other general symptoms and signs: Secondary | ICD-10-CM | POA: Diagnosis not present

## 2022-05-29 DIAGNOSIS — M79643 Pain in unspecified hand: Secondary | ICD-10-CM | POA: Diagnosis not present

## 2022-05-29 DIAGNOSIS — Z7401 Bed confinement status: Secondary | ICD-10-CM | POA: Diagnosis not present

## 2022-05-29 DIAGNOSIS — K8020 Calculus of gallbladder without cholecystitis without obstruction: Secondary | ICD-10-CM | POA: Insufficient documentation

## 2022-05-29 DIAGNOSIS — G9340 Encephalopathy, unspecified: Secondary | ICD-10-CM | POA: Diagnosis not present

## 2022-05-29 DIAGNOSIS — N400 Enlarged prostate without lower urinary tract symptoms: Secondary | ICD-10-CM | POA: Insufficient documentation

## 2022-05-29 DIAGNOSIS — Z79899 Other long term (current) drug therapy: Secondary | ICD-10-CM | POA: Insufficient documentation

## 2022-05-29 DIAGNOSIS — R442 Other hallucinations: Secondary | ICD-10-CM | POA: Diagnosis not present

## 2022-05-29 DIAGNOSIS — R531 Weakness: Secondary | ICD-10-CM | POA: Diagnosis not present

## 2022-05-29 DIAGNOSIS — N210 Calculus in bladder: Secondary | ICD-10-CM | POA: Diagnosis not present

## 2022-05-29 DIAGNOSIS — M47816 Spondylosis without myelopathy or radiculopathy, lumbar region: Secondary | ICD-10-CM | POA: Insufficient documentation

## 2022-05-29 DIAGNOSIS — G8114 Spastic hemiplegia affecting left nondominant side: Secondary | ICD-10-CM | POA: Diagnosis not present

## 2022-05-29 DIAGNOSIS — R410 Disorientation, unspecified: Secondary | ICD-10-CM | POA: Diagnosis not present

## 2022-05-29 DIAGNOSIS — M48061 Spinal stenosis, lumbar region without neurogenic claudication: Secondary | ICD-10-CM | POA: Diagnosis not present

## 2022-05-29 DIAGNOSIS — M6281 Muscle weakness (generalized): Secondary | ICD-10-CM | POA: Diagnosis not present

## 2022-05-29 DIAGNOSIS — R4182 Altered mental status, unspecified: Secondary | ICD-10-CM | POA: Diagnosis not present

## 2022-05-29 DIAGNOSIS — G936 Cerebral edema: Secondary | ICD-10-CM | POA: Diagnosis not present

## 2022-05-29 DIAGNOSIS — M79642 Pain in left hand: Secondary | ICD-10-CM | POA: Insufficient documentation

## 2022-05-29 DIAGNOSIS — G9341 Metabolic encephalopathy: Secondary | ICD-10-CM | POA: Diagnosis not present

## 2022-05-29 DIAGNOSIS — M545 Low back pain, unspecified: Secondary | ICD-10-CM

## 2022-05-29 DIAGNOSIS — M549 Dorsalgia, unspecified: Secondary | ICD-10-CM | POA: Diagnosis not present

## 2022-05-29 DIAGNOSIS — G9389 Other specified disorders of brain: Secondary | ICD-10-CM | POA: Insufficient documentation

## 2022-05-29 DIAGNOSIS — R1310 Dysphagia, unspecified: Secondary | ICD-10-CM | POA: Diagnosis not present

## 2022-05-29 DIAGNOSIS — K4090 Unilateral inguinal hernia, without obstruction or gangrene, not specified as recurrent: Secondary | ICD-10-CM | POA: Diagnosis not present

## 2022-05-29 LAB — CBC WITH DIFFERENTIAL/PLATELET
Abs Immature Granulocytes: 0.02 10*3/uL (ref 0.00–0.07)
Basophils Absolute: 0 10*3/uL (ref 0.0–0.1)
Basophils Relative: 0 %
Eosinophils Absolute: 0.2 10*3/uL (ref 0.0–0.5)
Eosinophils Relative: 3 %
HCT: 39.1 % (ref 39.0–52.0)
Hemoglobin: 12.1 g/dL — ABNORMAL LOW (ref 13.0–17.0)
Immature Granulocytes: 0 %
Lymphocytes Relative: 16 %
Lymphs Abs: 0.9 10*3/uL (ref 0.7–4.0)
MCH: 28.5 pg (ref 26.0–34.0)
MCHC: 30.9 g/dL (ref 30.0–36.0)
MCV: 92 fL (ref 80.0–100.0)
Monocytes Absolute: 0.4 10*3/uL (ref 0.1–1.0)
Monocytes Relative: 8 %
Neutro Abs: 4.3 10*3/uL (ref 1.7–7.7)
Neutrophils Relative %: 73 %
Platelets: 240 10*3/uL (ref 150–400)
RBC: 4.25 MIL/uL (ref 4.22–5.81)
RDW: 15.2 % (ref 11.5–15.5)
WBC: 5.9 10*3/uL (ref 4.0–10.5)
nRBC: 0 % (ref 0.0–0.2)

## 2022-05-29 LAB — AMMONIA: Ammonia: 24 umol/L (ref 9–35)

## 2022-05-29 LAB — URINALYSIS, ROUTINE W REFLEX MICROSCOPIC
Bilirubin Urine: NEGATIVE
Glucose, UA: NEGATIVE mg/dL
Ketones, ur: NEGATIVE mg/dL
Nitrite: NEGATIVE
Protein, ur: NEGATIVE mg/dL
Specific Gravity, Urine: 1.003 — ABNORMAL LOW (ref 1.005–1.030)
pH: 7 (ref 5.0–8.0)

## 2022-05-29 LAB — COMPREHENSIVE METABOLIC PANEL
ALT: 16 U/L (ref 0–44)
AST: 17 U/L (ref 15–41)
Albumin: 3.2 g/dL — ABNORMAL LOW (ref 3.5–5.0)
Alkaline Phosphatase: 79 U/L (ref 38–126)
Anion gap: 8 (ref 5–15)
BUN: 8 mg/dL (ref 8–23)
CO2: 28 mmol/L (ref 22–32)
Calcium: 8.9 mg/dL (ref 8.9–10.3)
Chloride: 107 mmol/L (ref 98–111)
Creatinine, Ser: 0.63 mg/dL (ref 0.61–1.24)
GFR, Estimated: >60 mL/min (ref >60)
Glucose, Bld: 99 mg/dL (ref 70–99)
Potassium: 3.8 mmol/L (ref 3.5–5.1)
Sodium: 143 mmol/L (ref 135–145)
Total Bilirubin: 0.3 mg/dL (ref 0.3–1.2)
Total Protein: 6 g/dL — ABNORMAL LOW (ref 6.5–8.1)

## 2022-05-29 LAB — ETHANOL: Alcohol, Ethyl (B): <10 mg/dL (ref <10)

## 2022-05-29 MED ORDER — ESCITALOPRAM OXALATE 10 MG PO TABS
20.0000 mg | ORAL_TABLET | Freq: Once | ORAL | Status: AC
  Administered 2022-05-29: 20 mg via ORAL
  Filled 2022-05-29: qty 2

## 2022-05-29 MED ORDER — ACETAMINOPHEN 325 MG PO TABS
650.0000 mg | ORAL_TABLET | Freq: Once | ORAL | Status: AC
  Administered 2022-05-29: 650 mg via ORAL
  Filled 2022-05-29: qty 2

## 2022-05-29 NOTE — Discharge Instructions (Signed)
You were seen in the emergency department for back pain.  You had a CAT scan of your abdomen and back that did not show any acute findings.  You do have signs of gallstones and some stones in your bladder.  You also have some arthritis changes in your back.  You also had a CAT scan of your head that was stable.  Your lab work was unremarkable.  You were given some Tylenol and a dose of Lexapro at your request.  Please follow-up with your treatment team.  Return to the emergency department if any worsening or concerning symptoms

## 2022-05-29 NOTE — ED Triage Notes (Signed)
Patient BIBA from [facility] c/o back pain. Was seen at [facility] ED yesterday for foreign body in throat and was d/c. He reports upon transportation back to his facility, he was dropped by EMS staff, but cannot recall any other details.   Staff at facility also report patient is confused. EMS reports pt Aox4. Denies SI/HI.   Hx meningioma & craniotomy performed 05/23.   BP 128/70 P 70 RR 16 SpO2 98% RA  CBG 108

## 2022-05-29 NOTE — ED Provider Notes (Signed)
[facility] DEPT Provider Note   CSN: [id number] Arrival date & time: 05/29/22  2426     History {Add pertinent medical, surgical, social history, OB history to HPI:1} No chief complaint on file.   Patrick Buchanan is a 71 y.o. male.  He has a recent diagnosis of a meningioma status postresection in May.  He was admitted last month for altered mental status with increased vasogenic edema put on steroids and ultimately discharged back to rehab.  Was seen yesterday for possible foreign body sensation in his throat and ultimately discharged.  He said when they were transferring him back to bed at a rehab they bumped his back on a railing.  Complaining of low back pain.  He also endorses left hand pain for 3 to 4 days unclear if any trauma.  He also said he was more confused earlier this morning, thinking he was Edmon Crape and was calling 911.  He feels that confusion has resolved.  His back pain is worse with movement.  Similar with his left hand pain.  He has chronic deficits of his left side from his meningioma.  Denies any headache cough shortness of breath abdominal pain vomiting diarrhea.  The history is provided by the patient.  Back Pain Location:  Lumbar spine Onset quality:  Gradual Duration:  1 day Progression:  Unchanged Chronicity:  New Relieved by:  None tried Worsened by:  Movement Ineffective treatments:  None tried Associated symptoms: no abdominal pain, no chest pain, no fever and no headaches   Risk factors: steroid use   Altered Mental Status Presenting symptoms: confusion   Most recent episode:  Today Episode history:  Single Progression:  Resolved Chronicity:  Recurrent Context: nursing home resident   Associated symptoms: no abdominal pain, no difficulty breathing, no fever, no headaches and no vomiting        Home Medications Prior to Admission medications   Medication Sig Start Date End Date Taking? Authorizing Provider   ALPRAZolam Duanne Moron) 0.5 MG tablet Take 1 tablet (0.5 mg total) by mouth 2 (two) times daily as needed for anxiety. 05/13/22   Thurnell Lose, MD  baclofen (LIORESAL) 10 MG tablet Take 1 tablet (10 mg total) by mouth 3 (three) times daily. 04/24/22   Ronnell Freshwater, NP  bisacodyl (DULCOLAX) 10 MG suppository Place 1 suppository (10 mg total) rectally daily as needed for moderate constipation. 05/13/22   Thurnell Lose, MD  Cholecalciferol (VITAMIN D3 PO) Take 1 capsule by mouth daily.    [provider]  diclofenac Sodium (VOLTAREN) 1 % GEL Apply 2 g topically 3 (three) times daily. 03/30/22   Setzer, Edman Circle, PA-C  escitalopram (LEXAPRO) 20 MG tablet Take 1 tablet (20 mg total) by mouth daily. 04/24/22   Ronnell Freshwater, NP  gabapentin (NEURONTIN) 100 MG capsule Take 1 capsule (100 mg total) by mouth 2 (two) times daily. 04/24/22   Ronnell Freshwater, NP  lacosamide 100 MG TABS Take 1 tablet (100 mg total) by mouth 2 (two) times daily. 05/13/22   Thurnell Lose, MD  lamoTRIgine (LAMICTAL) 100 MG tablet Take 1 tablet (100 mg total) by mouth 2 (two) times daily. 04/24/22   Ronnell Freshwater, NP  linaclotide (LINZESS) 145 MCG CAPS capsule Take 1 capsule (145 mcg total) by mouth daily before breakfast. 04/24/22   Ronnell Freshwater, NP  magnesium gluconate (MAGONATE) 500 MG tablet Take 0.5 tablets (250 mg total) by mouth at bedtime. 04/24/22  Ronnell Freshwater, NP  Multiple Vitamin (MULTIVITAMIN) tablet Take 1 tablet by mouth daily.    [provider]  pantoprazole (PROTONIX) 40 MG tablet Take 1 tablet (40 mg total) by mouth at bedtime. 04/24/22   Ronnell Freshwater, NP  polyvinyl alcohol (LIQUIFILM TEARS) 1.4 % ophthalmic solution Place 1 drop into both eyes as needed for dry eyes.    [provider]  QUEtiapine (SEROQUEL) 50 MG tablet Take 1 tablet (50 mg total) by mouth at bedtime. 05/13/22   Thurnell Lose, MD  rosuvastatin (CRESTOR) 20 MG tablet Take 1 tablet (20 mg total) by  mouth daily. 04/24/22   Ronnell Freshwater, NP  senna-docusate (SENOKOT-S) 8.6-50 MG tablet Take 2 tablets by mouth at bedtime. 03/30/22   Setzer, Edman Circle, PA-C  tamsulosin (FLOMAX) 0.4 MG CAPS capsule Take 1 capsule (0.4 mg total) by mouth daily after supper. 05/13/22   Thurnell Lose, MD  traZODone (DESYREL) 50 MG tablet Take 0.5-1 tablets (25-50 mg total) by mouth at bedtime as needed for sleep. 04/24/22   Ronnell Freshwater, NP  Vitamin D, Ergocalciferol, (DRISDOL) 1.25 MG (50000 UNIT) CAPS capsule Take 1 capsule (50,000 Units total) by mouth every Sunday. Take one tablet wkly 04/30/22   Ronnell Freshwater, NP      Allergies    Other    Review of Systems   Review of Systems  Constitutional:  Negative for fever.  Eyes:  Negative for visual disturbance.  Respiratory:  Negative for shortness of breath.   Cardiovascular:  Negative for chest pain.  Gastrointestinal:  Negative for abdominal pain and vomiting.  Musculoskeletal:  Positive for back pain.  Neurological:  Negative for headaches.  Psychiatric/Behavioral:  Positive for confusion.     Physical Exam Updated Vital Signs Ht '5\' 11"'$  (1.803 m)   Wt 84.8 kg   BMI 26.08 kg/m  Physical Exam Vitals and nursing note reviewed.  Constitutional:      General: He is not in acute distress.    Appearance: Normal appearance. He is well-developed.  HENT:     Head: Normocephalic and atraumatic.  Eyes:     Conjunctiva/sclera: Conjunctivae normal.  Cardiovascular:     Rate and Rhythm: Normal rate and regular rhythm.     Heart sounds: No murmur heard. Pulmonary:     Effort: Pulmonary effort is normal. No respiratory distress.     Breath sounds: Normal breath sounds.  Abdominal:     Palpations: Abdomen is soft.     Tenderness: There is no abdominal tenderness.  Musculoskeletal:        General: No swelling.     Cervical back: Neck supple.  Skin:    General: Skin is warm and dry.     Capillary Refill: Capillary refill takes less than 2  seconds.  Neurological:     Mental Status: He is alert. Mental status is at baseline.     Comments: Patient is alert and oriented.  No facial droop no dysarthria.  He has 4 out of 5 grip strength on left although limited proximal arm strength.  Lower extremity strength 3 out of 5 on left.     ED Results / Procedures / Treatments   Labs (all labs ordered are listed, but only abnormal results are displayed) Labs Reviewed  COMPREHENSIVE METABOLIC PANEL  CBC WITH DIFFERENTIAL/PLATELET  URINALYSIS, ROUTINE W REFLEX MICROSCOPIC  ETHANOL  AMMONIA    EKG None  Radiology DG Neck Soft Tissue  Result Date: 05/28/2022 CLINICAL  DATA:  Foreign body in throat. Piece of hamburger is caught in throat. EXAM: NECK SOFT TISSUES - 1+ VIEW COMPARISON:  None Available. FINDINGS: There is no evidence of retropharyngeal soft tissue swelling or epiglottic enlargement. The cervical airway is unremarkable. No radio-opaque foreign body identified. No evidence of pneumomediastinum in the neck. Soft tissue planes are non suspicious. IMPRESSION: Negative soft tissue neck radiographs.  No radiopaque foreign body. Electronically Signed   By: Keith Rake M.D.   On: 05/28/2022 21:06    Procedures Procedures  {Document cardiac monitor, telemetry assessment procedure when appropriate:1}  Medications Ordered in ED Medications - No data to display  ED Course/ Medical Decision Making/ A&P                           Medical Decision Making Amount and/or Complexity of Data Reviewed Labs: ordered. Radiology: ordered.   This patient complains of ***; this involves an extensive number of treatment Options and is a complaint that carries with it a high risk of complications and morbidity. The differential includes ***  I ordered, reviewed and interpreted labs, which included *** I ordered medication *** and reviewed PMP when indicated. I ordered imaging studies which included *** and I independently     visualized and interpreted imaging which showed *** Additional history obtained from *** Previous records obtained and reviewed *** I consulted *** and discussed lab and imaging findings and discussed disposition.  Cardiac monitoring reviewed, *** Social determinants considered, *** Critical Interventions: ***  After the interventions stated above, I reevaluated the patient and found *** Admission and further testing considered, ***   {Document critical care time when appropriate:1} {Document review of labs and clinical decision tools ie heart score, Chads2Vasc2 etc:1}  {Document your independent review of radiology images, and any outside records:1} {Document your discussion with family members, caretakers, and with consultants:1} {Document social determinants of health affecting pt's care:1} {Document your decision making why or why not admission, treatments were needed:1} Final Clinical Impression(s) / ED Diagnoses Final diagnoses:  None    Rx / DC Orders ED Discharge Orders     None

## 2022-05-30 DIAGNOSIS — G9341 Metabolic encephalopathy: Secondary | ICD-10-CM | POA: Diagnosis not present

## 2022-05-30 DIAGNOSIS — M6281 Muscle weakness (generalized): Secondary | ICD-10-CM | POA: Diagnosis not present

## 2022-05-30 DIAGNOSIS — G936 Cerebral edema: Secondary | ICD-10-CM | POA: Diagnosis not present

## 2022-05-30 DIAGNOSIS — R1310 Dysphagia, unspecified: Secondary | ICD-10-CM | POA: Diagnosis not present

## 2022-05-31 DIAGNOSIS — G936 Cerebral edema: Secondary | ICD-10-CM | POA: Diagnosis not present

## 2022-05-31 DIAGNOSIS — R1310 Dysphagia, unspecified: Secondary | ICD-10-CM | POA: Diagnosis not present

## 2022-05-31 DIAGNOSIS — M6281 Muscle weakness (generalized): Secondary | ICD-10-CM | POA: Diagnosis not present

## 2022-05-31 DIAGNOSIS — G9341 Metabolic encephalopathy: Secondary | ICD-10-CM | POA: Diagnosis not present

## 2022-06-01 DIAGNOSIS — G9341 Metabolic encephalopathy: Secondary | ICD-10-CM | POA: Diagnosis not present

## 2022-06-01 DIAGNOSIS — G936 Cerebral edema: Secondary | ICD-10-CM | POA: Diagnosis not present

## 2022-06-01 DIAGNOSIS — M6281 Muscle weakness (generalized): Secondary | ICD-10-CM | POA: Diagnosis not present

## 2022-06-01 DIAGNOSIS — R1310 Dysphagia, unspecified: Secondary | ICD-10-CM | POA: Diagnosis not present

## 2022-06-02 DIAGNOSIS — R1310 Dysphagia, unspecified: Secondary | ICD-10-CM | POA: Diagnosis not present

## 2022-06-02 DIAGNOSIS — D329 Benign neoplasm of meninges, unspecified: Secondary | ICD-10-CM | POA: Diagnosis not present

## 2022-06-02 DIAGNOSIS — G936 Cerebral edema: Secondary | ICD-10-CM | POA: Diagnosis not present

## 2022-06-02 DIAGNOSIS — F411 Generalized anxiety disorder: Secondary | ICD-10-CM | POA: Diagnosis not present

## 2022-06-02 DIAGNOSIS — F3341 Major depressive disorder, recurrent, in partial remission: Secondary | ICD-10-CM | POA: Diagnosis not present

## 2022-06-02 DIAGNOSIS — G9341 Metabolic encephalopathy: Secondary | ICD-10-CM | POA: Diagnosis not present

## 2022-06-02 DIAGNOSIS — M6281 Muscle weakness (generalized): Secondary | ICD-10-CM | POA: Diagnosis not present

## 2022-06-05 DIAGNOSIS — G936 Cerebral edema: Secondary | ICD-10-CM | POA: Diagnosis not present

## 2022-06-05 DIAGNOSIS — M6281 Muscle weakness (generalized): Secondary | ICD-10-CM | POA: Diagnosis not present

## 2022-06-05 DIAGNOSIS — G9341 Metabolic encephalopathy: Secondary | ICD-10-CM | POA: Diagnosis not present

## 2022-06-05 DIAGNOSIS — R1310 Dysphagia, unspecified: Secondary | ICD-10-CM | POA: Diagnosis not present

## 2022-06-06 DIAGNOSIS — R1310 Dysphagia, unspecified: Secondary | ICD-10-CM | POA: Diagnosis not present

## 2022-06-06 DIAGNOSIS — G936 Cerebral edema: Secondary | ICD-10-CM | POA: Diagnosis not present

## 2022-06-06 DIAGNOSIS — G9341 Metabolic encephalopathy: Secondary | ICD-10-CM | POA: Diagnosis not present

## 2022-06-06 DIAGNOSIS — M6281 Muscle weakness (generalized): Secondary | ICD-10-CM | POA: Diagnosis not present

## 2022-06-07 DIAGNOSIS — G936 Cerebral edema: Secondary | ICD-10-CM | POA: Diagnosis not present

## 2022-06-07 DIAGNOSIS — G8194 Hemiplegia, unspecified affecting left nondominant side: Secondary | ICD-10-CM | POA: Diagnosis not present

## 2022-06-07 DIAGNOSIS — M1990 Unspecified osteoarthritis, unspecified site: Secondary | ICD-10-CM | POA: Diagnosis not present

## 2022-06-07 DIAGNOSIS — M6281 Muscle weakness (generalized): Secondary | ICD-10-CM | POA: Diagnosis not present

## 2022-06-07 DIAGNOSIS — N210 Calculus in bladder: Secondary | ICD-10-CM | POA: Diagnosis not present

## 2022-06-07 DIAGNOSIS — K8020 Calculus of gallbladder without cholecystitis without obstruction: Secondary | ICD-10-CM | POA: Diagnosis not present

## 2022-06-07 DIAGNOSIS — G9341 Metabolic encephalopathy: Secondary | ICD-10-CM | POA: Diagnosis not present

## 2022-06-07 DIAGNOSIS — N400 Enlarged prostate without lower urinary tract symptoms: Secondary | ICD-10-CM | POA: Diagnosis not present

## 2022-06-07 DIAGNOSIS — M48061 Spinal stenosis, lumbar region without neurogenic claudication: Secondary | ICD-10-CM | POA: Diagnosis not present

## 2022-06-07 DIAGNOSIS — R1310 Dysphagia, unspecified: Secondary | ICD-10-CM | POA: Diagnosis not present

## 2022-06-07 DIAGNOSIS — M24542 Contracture, left hand: Secondary | ICD-10-CM | POA: Diagnosis not present

## 2022-06-08 DIAGNOSIS — M6281 Muscle weakness (generalized): Secondary | ICD-10-CM | POA: Diagnosis not present

## 2022-06-08 DIAGNOSIS — R1310 Dysphagia, unspecified: Secondary | ICD-10-CM | POA: Diagnosis not present

## 2022-06-08 DIAGNOSIS — G9341 Metabolic encephalopathy: Secondary | ICD-10-CM | POA: Diagnosis not present

## 2022-06-08 DIAGNOSIS — G936 Cerebral edema: Secondary | ICD-10-CM | POA: Diagnosis not present

## 2022-06-08 DIAGNOSIS — F331 Major depressive disorder, recurrent, moderate: Secondary | ICD-10-CM | POA: Diagnosis not present

## 2022-06-09 DIAGNOSIS — G9341 Metabolic encephalopathy: Secondary | ICD-10-CM | POA: Diagnosis not present

## 2022-06-09 DIAGNOSIS — M6281 Muscle weakness (generalized): Secondary | ICD-10-CM | POA: Diagnosis not present

## 2022-06-09 DIAGNOSIS — R1310 Dysphagia, unspecified: Secondary | ICD-10-CM | POA: Diagnosis not present

## 2022-06-09 DIAGNOSIS — G936 Cerebral edema: Secondary | ICD-10-CM | POA: Diagnosis not present

## 2022-06-12 DIAGNOSIS — R1310 Dysphagia, unspecified: Secondary | ICD-10-CM | POA: Diagnosis not present

## 2022-06-12 DIAGNOSIS — G9341 Metabolic encephalopathy: Secondary | ICD-10-CM | POA: Diagnosis not present

## 2022-06-12 DIAGNOSIS — G936 Cerebral edema: Secondary | ICD-10-CM | POA: Diagnosis not present

## 2022-06-12 DIAGNOSIS — M6281 Muscle weakness (generalized): Secondary | ICD-10-CM | POA: Diagnosis not present

## 2022-06-13 DIAGNOSIS — G9341 Metabolic encephalopathy: Secondary | ICD-10-CM | POA: Diagnosis not present

## 2022-06-13 DIAGNOSIS — G936 Cerebral edema: Secondary | ICD-10-CM | POA: Diagnosis not present

## 2022-06-13 DIAGNOSIS — R1310 Dysphagia, unspecified: Secondary | ICD-10-CM | POA: Diagnosis not present

## 2022-06-13 DIAGNOSIS — M6281 Muscle weakness (generalized): Secondary | ICD-10-CM | POA: Diagnosis not present

## 2022-06-14 ENCOUNTER — Emergency Department (HOSPITAL_COMMUNITY)
Admission: EM | Admit: 2022-06-14 | Discharge: 2022-06-14 | Disposition: A | Discharge status: Skilled Nursing Facility | Payer: Medicare HMO | Attending: Emergency Medicine | Admitting: Emergency Medicine

## 2022-06-14 ENCOUNTER — Other Ambulatory Visit: Payer: Self-pay

## 2022-06-14 ENCOUNTER — Emergency Department (HOSPITAL_COMMUNITY): Payer: Medicare HMO

## 2022-06-14 DIAGNOSIS — Z711 Person with feared health complaint in whom no diagnosis is made: Secondary | ICD-10-CM | POA: Diagnosis not present

## 2022-06-14 DIAGNOSIS — G936 Cerebral edema: Secondary | ICD-10-CM | POA: Diagnosis not present

## 2022-06-14 DIAGNOSIS — S0990XA Unspecified injury of head, initial encounter: Secondary | ICD-10-CM | POA: Diagnosis not present

## 2022-06-14 DIAGNOSIS — L219 Seborrheic dermatitis, unspecified: Secondary | ICD-10-CM | POA: Diagnosis not present

## 2022-06-14 DIAGNOSIS — I10 Essential (primary) hypertension: Secondary | ICD-10-CM | POA: Diagnosis not present

## 2022-06-14 DIAGNOSIS — E119 Type 2 diabetes mellitus without complications: Secondary | ICD-10-CM | POA: Diagnosis not present

## 2022-06-14 DIAGNOSIS — M6281 Muscle weakness (generalized): Secondary | ICD-10-CM | POA: Diagnosis not present

## 2022-06-14 DIAGNOSIS — R531 Weakness: Secondary | ICD-10-CM | POA: Diagnosis not present

## 2022-06-14 DIAGNOSIS — M549 Dorsalgia, unspecified: Secondary | ICD-10-CM | POA: Diagnosis not present

## 2022-06-14 DIAGNOSIS — R1310 Dysphagia, unspecified: Secondary | ICD-10-CM | POA: Diagnosis not present

## 2022-06-14 DIAGNOSIS — W06XXXA Fall from bed, initial encounter: Secondary | ICD-10-CM | POA: Insufficient documentation

## 2022-06-14 DIAGNOSIS — M542 Cervicalgia: Secondary | ICD-10-CM | POA: Diagnosis not present

## 2022-06-14 DIAGNOSIS — Z043 Encounter for examination and observation following other accident: Secondary | ICD-10-CM | POA: Diagnosis not present

## 2022-06-14 DIAGNOSIS — G8194 Hemiplegia, unspecified affecting left nondominant side: Secondary | ICD-10-CM | POA: Diagnosis not present

## 2022-06-14 DIAGNOSIS — Z7401 Bed confinement status: Secondary | ICD-10-CM | POA: Diagnosis not present

## 2022-06-14 DIAGNOSIS — I671 Cerebral aneurysm, nonruptured: Secondary | ICD-10-CM | POA: Diagnosis not present

## 2022-06-14 DIAGNOSIS — R410 Disorientation, unspecified: Secondary | ICD-10-CM | POA: Diagnosis not present

## 2022-06-14 DIAGNOSIS — G459 Transient cerebral ischemic attack, unspecified: Secondary | ICD-10-CM | POA: Diagnosis not present

## 2022-06-14 DIAGNOSIS — D420 Neoplasm of uncertain behavior of cerebral meninges: Secondary | ICD-10-CM | POA: Insufficient documentation

## 2022-06-14 DIAGNOSIS — M4328 Fusion of spine, sacral and sacrococcygeal region: Secondary | ICD-10-CM | POA: Diagnosis not present

## 2022-06-14 DIAGNOSIS — M25552 Pain in left hip: Secondary | ICD-10-CM | POA: Insufficient documentation

## 2022-06-14 DIAGNOSIS — Z9181 History of falling: Secondary | ICD-10-CM | POA: Diagnosis not present

## 2022-06-14 DIAGNOSIS — R519 Headache, unspecified: Secondary | ICD-10-CM | POA: Diagnosis not present

## 2022-06-14 DIAGNOSIS — Z960 Presence of urogenital implants: Secondary | ICD-10-CM | POA: Diagnosis not present

## 2022-06-14 DIAGNOSIS — D649 Anemia, unspecified: Secondary | ICD-10-CM | POA: Diagnosis not present

## 2022-06-14 DIAGNOSIS — F39 Unspecified mood [affective] disorder: Secondary | ICD-10-CM | POA: Diagnosis not present

## 2022-06-14 DIAGNOSIS — W19XXXA Unspecified fall, initial encounter: Secondary | ICD-10-CM

## 2022-06-14 DIAGNOSIS — G9341 Metabolic encephalopathy: Secondary | ICD-10-CM | POA: Diagnosis not present

## 2022-06-14 MED ORDER — OXYCODONE-ACETAMINOPHEN 5-325 MG PO TABS
1.0000 | ORAL_TABLET | Freq: Once | ORAL | Status: AC
  Administered 2022-06-14: 1 via ORAL
  Filled 2022-06-14: qty 1

## 2022-06-14 NOTE — ED Notes (Signed)
[facility] CALLED  °

## 2022-06-14 NOTE — ED Provider Notes (Signed)
[facility] EMERGENCY DEPARTMENT Provider Note   CSN: [id number] Arrival date & time: 06/14/22  0403     History  Chief Complaint  Patient presents with   Patrick Buchanan is a 71 y.o. male.  The history is provided by the patient and medical records.  Fall   71 year old male with history of spinal and brain meningioma status post resection, hyperlipidemia, diabetes, presenting to the ED after a fall.  Patient currently at [facility], sustained fall this morning while getting up to go to the bathroom.  He is generally nonambulatory without significant assistance.  States he fell from the bed onto his left hip on the floor.  Struck his head on a chair but denies loss of consciousness.  His biggest complaint is pain in his left hip.  He denies any significant headache, confusion, or neck pain.  He is not on any current anticoagulation.  Home Medications Prior to Admission medications   Medication Sig Start Date End Date Taking? Authorizing Provider  ALPRAZolam Duanne Moron) 0.5 MG tablet Take 1 tablet (0.5 mg total) by mouth 2 (two) times daily as needed for anxiety. 05/13/22   Thurnell Lose, MD  baclofen (LIORESAL) 10 MG tablet Take 1 tablet (10 mg total) by mouth 3 (three) times daily. 04/24/22   Ronnell Freshwater, NP  bisacodyl (DULCOLAX) 10 MG suppository Place 1 suppository (10 mg total) rectally daily as needed for moderate constipation. 05/13/22   Thurnell Lose, MD  Cholecalciferol (VITAMIN D3 PO) Take 1 capsule by mouth daily.    [provider]  diclofenac Sodium (VOLTAREN) 1 % GEL Apply 2 g topically 3 (three) times daily. 03/30/22   Setzer, [address], PA-C  escitalopram (LEXAPRO) 20 MG tablet Take 1 tablet (20 mg total) by mouth daily. 04/24/22   Ronnell Freshwater, NP  gabapentin (NEURONTIN) 100 MG capsule Take 1 capsule (100 mg total) by mouth 2 (two) times daily. 04/24/22   Ronnell Freshwater, NP  lacosamide 100 MG TABS Take 1 tablet (100  mg total) by mouth 2 (two) times daily. 05/13/22   Thurnell Lose, MD  lamoTRIgine (LAMICTAL) 100 MG tablet Take 1 tablet (100 mg total) by mouth 2 (two) times daily. 04/24/22   Ronnell Freshwater, NP  linaclotide (LINZESS) 145 MCG CAPS capsule Take 1 capsule (145 mcg total) by mouth daily before breakfast. 04/24/22   Ronnell Freshwater, NP  magnesium gluconate (MAGONATE) 500 MG tablet Take 0.5 tablets (250 mg total) by mouth at bedtime. 04/24/22   Ronnell Freshwater, NP  Multiple Vitamin (MULTIVITAMIN) tablet Take 1 tablet by mouth daily.    [provider]  pantoprazole (PROTONIX) 40 MG tablet Take 1 tablet (40 mg total) by mouth at bedtime. 04/24/22   Ronnell Freshwater, NP  polyvinyl alcohol (LIQUIFILM TEARS) 1.4 % ophthalmic solution Place 1 drop into both eyes as needed for dry eyes.    [provider]  QUEtiapine (SEROQUEL) 50 MG tablet Take 1 tablet (50 mg total) by mouth at bedtime. 05/13/22   Thurnell Lose, MD  rosuvastatin (CRESTOR) 20 MG tablet Take 1 tablet (20 mg total) by mouth daily. 04/24/22   Ronnell Freshwater, NP  senna-docusate (SENOKOT-S) 8.6-50 MG tablet Take 2 tablets by mouth at bedtime. 03/30/22   Setzer, [address] Circle, PA-C  tamsulosin (FLOMAX) 0.4 MG CAPS capsule Take 1 capsule (0.4 mg total) by mouth daily after supper. 05/13/22   Thurnell Lose,  MD  traZODone (DESYREL) 50 MG tablet Take 0.5-1 tablets (25-50 mg total) by mouth at bedtime as needed for sleep. 04/24/22   Ronnell Freshwater, NP  Vitamin D, Ergocalciferol, (DRISDOL) 1.25 MG (50000 UNIT) CAPS capsule Take 1 capsule (50,000 Units total) by mouth every Sunday. Take one tablet wkly 04/30/22   Ronnell Freshwater, NP      Allergies    Other    Review of Systems   Review of Systems  Musculoskeletal:  Positive for arthralgias.  All other systems reviewed and are negative.   Physical Exam Updated Vital Signs BP 128/75   Pulse 81   Temp 97.9 F (36.6 C) (Oral)   Resp 19   Ht '5\' 11"'$  (1.803 m)   Wt 84.8 kg    SpO2 94%   BMI 26.08 kg/m   Physical Exam Vitals and nursing note reviewed.  Constitutional:      Appearance: He is well-developed.  HENT:     Head: Normocephalic and atraumatic.     Comments: No visible head trauma Eyes:     Conjunctiva/sclera: Conjunctivae normal.     Pupils: Pupils are equal, round, and reactive to light.  Cardiovascular:     Rate and Rhythm: Normal rate and regular rhythm.     Heart sounds: Normal heart sounds.  Pulmonary:     Effort: Pulmonary effort is normal.     Breath sounds: Normal breath sounds.  Abdominal:     General: Bowel sounds are normal.     Palpations: Abdomen is soft.  Musculoskeletal:        General: Normal range of motion.     Cervical back: Normal range of motion.     Comments: No bruising or deformity noted to left hip, no leg shortening or malrotation, DP pulse intact, normal distal sensation  Skin:    General: Skin is warm and dry.  Neurological:     Mental Status: He is alert and oriented to person, place, and time.     Comments: Awake, alert, oriented x3, left-sided weakness compared with right which is chronic, able to answer questions     ED Results / Procedures / Treatments   Labs (all labs ordered are listed, but only abnormal results are displayed) Labs Reviewed - No data to display  EKG None  Radiology CT Cervical Spine Wo Contrast  Result Date: 06/14/2022 CLINICAL DATA:  71 year old male status post fall striking head. Suspected Meningiomatosis on head CTs this year and status post craniotomy and tumor resection in May. Prior aneurysm clipping. EXAM: CT CERVICAL SPINE WITHOUT CONTRAST TECHNIQUE: Multidetector CT imaging of the cervical spine was performed without intravenous contrast. Multiplanar CT image reconstructions were also generated. RADIATION DOSE REDUCTION: This exam was performed according to the departmental dose-optimization program which includes automated exposure control, adjustment of the mA and/or kV  according to patient size and/or use of iterative reconstruction technique. COMPARISON:  Head CT today reported separately. Cervical spine CT myelogram 07/25/2004. FINDINGS: Alignment: Lordosis has not significantly changed since 2005. Cervicothoracic junction alignment is within normal limits. Bilateral posterior element alignment is within normal limits. Skull base and vertebrae: Postoperative details are below. Visualized skull base is intact. No atlanto-occipital dissociation. C1 and C2 appear intact and aligned. No acute osseous abnormality identified. Soft tissues and spinal canal: No prevertebral fluid or swelling. No visible canal hematoma. Negative visible noncontrast neck soft tissues. Disc levels: Partially calcified ventral right C2-C3 intradural extra medullary mass is suspicious for a small 8-9 mm spinal  meningioma in light of the Head CT findings today (series 8, image 29). Previous posterior spinal decompression C5-C6 through the upper thoracic spine, new from the 2005 CT myelogram. No significant spinal stenosis suspected. Chronic severe disc and endplate degeneration at C6-C7. Interbody ankylosis from flowing endplate osteophyte at the cervicothoracic junction. Upper chest: Previous T1 posterior decompression. T1-T2 flowing endplate osteophytosis with evidence of interbody ankylosis. No acute osseous abnormality identified. Negative lung apices with evidence of mild centrilobular emphysema. Negative visible noncontrast thoracic inlet. Other: Head CT today reported separately. IMPRESSION: 1. No acute traumatic injury identified in the cervical spine. 2. Posterior spinal decompression since 2005 from C5-C6 through the upper thoracic spine. Severe C6-C7 disc and endplate degeneration superimposed on Interbody ankylosis C7-T1 and T1-T2. 3. Partially calcified intradural extra medullary mass at C2-C3 is suspicious for an 8-9 mm spinal meningioma rather than disc herniation in light of intracranial  Meningiomatosis (reported separately). Electronically Signed   By: Genevie Ann M.D.   On: 06/14/2022 05:36   CT HEAD WO CONTRAST (5MM)  Result Date: 06/14/2022 CLINICAL DATA:  71 year old male status post fall striking head. Suspected Meningiomatosis on head CTs this year and status post craniotomy and tumor resection in May. Prior aneurysm clipping. EXAM: CT HEAD WITHOUT CONTRAST TECHNIQUE: Contiguous axial images were obtained from the base of the skull through the vertex without intravenous contrast. RADIATION DOSE REDUCTION: This exam was performed according to the departmental dose-optimization program which includes automated exposure control, adjustment of the mA and/or kV according to patient size and/or use of iterative reconstruction technique. COMPARISON:  Head CTs 05/29/2022 and earlier. FINDINGS: Brain: Numerous hyperdense and partially calcified extra-axial lesions, mostly para falcine and along the left lateral cerebral convexity. Relatively large at least 4.4 cm lesion at the floor of the left middle cranial fossa likely affects the left cavernous sinus as before. And there is a central sellar lesion redemonstrated. Left supratentorial relatively large mass also is at least 3.8 cm. Postoperative encephalomalacia at the right vertex appears stable. Ongoing mild cerebral edema in both the left inferior frontal gyrus and at the junction of the posterior left temporal and occipital lobes is stable from earlier this month. Stable associated intracranial mass effect with 5 mm rightward midline shift. Partial effacement of the left lateral ventricle is stable without ventriculomegaly. Basilar cisterns remain patent. No cortically based acute infarct identified. No acute intracranial hemorrhage identified. Vascular: Distal right ICA region aneurysm clip appears stable and configuration. Skull: Previous right vertex and right frontotemporal craniotomies. No acute osseous abnormality identified.  Sinuses/Orbits: Visualized paranasal sinuses and mastoids are stable and well aerated. Other: Stable postoperative changes to the scalp vertex. Orbits soft tissues appear negative. IMPRESSION: 1.  No acute intracranial or acute traumatic injury identified. 2. Meningiomatosis with stable associated intracranial mass effect (5 mm rightward midline shift) and left hemisphere vasogenic edema. Stable right vertex tumor resection appearance. 3. Chronic distal right ICA aneurysm clipping. Electronically Signed   By: Genevie Ann M.D.   On: 06/14/2022 05:31   DG Hip Unilat W or Wo Pelvis 2-3 Views Left  Result Date: 06/14/2022 CLINICAL DATA:  71 year old male status post fall with hip pain. EXAM: DG HIP (WITH OR WITHOUT PELVIS) 2-3V LEFT COMPARISON:  Recent CT Abdomen and Pelvis 05/29/2022. FINDINGS: Portable AP supine view at 0423 hours. Femoral heads remain normally located. Hip joint spaces appear stable and relatively symmetric. Bone mineralization is within normal limits for age. Proximal left femur appears intact. No pelvis fracture identified. Bilateral  SI joints are ankylosed. Grossly intact proximal right femur. Negative visible bowel gas. Calcified iliofemoral atherosclerosis. IMPRESSION: 1. No acute fracture or dislocation identified about the left hip or pelvis. 2. Bilateral SI joint ankylosis. 3. Calcified iliofemoral atherosclerosis. Electronically Signed   By: Genevie Ann M.D.   On: 06/14/2022 04:58    Procedures Procedures    Medications Ordered in ED Medications  oxyCODONE-acetaminophen (PERCOCET/ROXICET) 5-325 MG per tablet 1 tablet (1 tablet Oral Given 06/14/22 0448)    ED Course/ Medical Decision Making/ A&P                           Medical Decision Making Amount and/or Complexity of Data Reviewed Radiology: ordered.  Risk Prescription drug management.   71 year old male presenting to the ED after a fall.  Currently at [facility] rehab secondary to left-sided deficits from chronic brain  and spine meningiomas.  [address] while trying to get out of bed.  He is generally nonambulatory at baseline.  Reports he fell onto left hip, struck head on a nearby chair.  No loss of consciousness.  Not currently on anticoagulation.  He is awake, alert, oriented.  Does have left-sided weakness compared with right, this is chronic and generally unchanged.  Complains of left hip pain but there is no bruising, swelling, or bony deformity.  No leg shortening or malrotation.  Leg is neurovascularly intact.  No visible signs of head trauma.  As he does have known tumors, will obtain CT head and cervical spine as well as hip films.  6:02 AM CT's without chronic intracranial findings, no acute findings.  Vitals are stable.  Hip films also negative.  He has no deformity or leg shortening on exam.  Leg remains neurovascular intact.  Feel he is stable for discharge back to facility, PCP can follow-up.  Return here for new concerns.  Final Clinical Impression(s) / ED Diagnoses Final diagnoses:  Fall, initial encounter    Rx / DC Orders ED Discharge Orders     None         Larene Pickett, PA-C 06/14/22 3009    Margette Fast, MD 06/15/22 0028

## 2022-06-14 NOTE — ED Notes (Signed)
Attempted report to [facility].

## 2022-06-14 NOTE — Discharge Instructions (Signed)
Imaging today negative for acute findings. Can follow-up with primary care doctor. Return here for new concerns.

## 2022-06-14 NOTE — ED Triage Notes (Signed)
Pt BIB [facility] from [facility] following a fall.   EMS reports pt feel today hitting his head. Staff states non ambulatory went to get up to go to bathroom. A&Ox4. No obvious deformity/injury. No LOC.   EMS VS BP 120/82 PR 92 RR 18 SpO2 96% RA.

## 2022-06-14 NOTE — ED Notes (Signed)
Condom catheter placed

## 2022-06-15 DIAGNOSIS — G9341 Metabolic encephalopathy: Secondary | ICD-10-CM | POA: Diagnosis not present

## 2022-06-15 DIAGNOSIS — G936 Cerebral edema: Secondary | ICD-10-CM | POA: Diagnosis not present

## 2022-06-15 DIAGNOSIS — M6281 Muscle weakness (generalized): Secondary | ICD-10-CM | POA: Diagnosis not present

## 2022-06-15 DIAGNOSIS — R1310 Dysphagia, unspecified: Secondary | ICD-10-CM | POA: Diagnosis not present

## 2022-06-16 ENCOUNTER — Telehealth: Payer: Self-pay | Admitting: *Deleted

## 2022-06-16 DIAGNOSIS — M6281 Muscle weakness (generalized): Secondary | ICD-10-CM | POA: Diagnosis not present

## 2022-06-16 DIAGNOSIS — R1310 Dysphagia, unspecified: Secondary | ICD-10-CM | POA: Diagnosis not present

## 2022-06-16 DIAGNOSIS — G936 Cerebral edema: Secondary | ICD-10-CM | POA: Diagnosis not present

## 2022-06-16 DIAGNOSIS — G9341 Metabolic encephalopathy: Secondary | ICD-10-CM | POA: Diagnosis not present

## 2022-06-16 NOTE — Telephone Encounter (Signed)
     Patient  visit on 06/13/2022  at [facility]  was for fall  Have you been able to follow up with your primary care physician?Patient is in a facility and has been seen by the facility resident is doing better   The patient was able to obtain any needed medicine or equipment.  Are there diet recommendations that you are having difficulty following?NA  Patient expresses understanding of discharge instructions and education provided has no other needs at this time.    [place] [phone] [address] , [place] [id number] Email : [email or web address]

## 2022-06-17 ENCOUNTER — Other Ambulatory Visit: Payer: Self-pay

## 2022-06-17 ENCOUNTER — Emergency Department (HOSPITAL_COMMUNITY): Payer: Medicare HMO

## 2022-06-17 ENCOUNTER — Encounter (HOSPITAL_COMMUNITY): Payer: Self-pay

## 2022-06-17 ENCOUNTER — Emergency Department (HOSPITAL_COMMUNITY)
Admission: EM | Admit: 2022-06-17 | Discharge: 2022-06-17 | Disposition: A | Discharge status: Home / Self Care | Payer: Medicare HMO | Attending: Emergency Medicine | Admitting: Emergency Medicine

## 2022-06-17 DIAGNOSIS — M47812 Spondylosis without myelopathy or radiculopathy, cervical region: Secondary | ICD-10-CM | POA: Diagnosis not present

## 2022-06-17 DIAGNOSIS — I700 Atherosclerosis of aorta: Secondary | ICD-10-CM | POA: Insufficient documentation

## 2022-06-17 DIAGNOSIS — F29 Unspecified psychosis not due to a substance or known physiological condition: Secondary | ICD-10-CM | POA: Diagnosis not present

## 2022-06-17 DIAGNOSIS — Z0000 Encounter for general adult medical examination without abnormal findings: Secondary | ICD-10-CM | POA: Insufficient documentation

## 2022-06-17 DIAGNOSIS — Z7401 Bed confinement status: Secondary | ICD-10-CM | POA: Diagnosis not present

## 2022-06-17 DIAGNOSIS — S3993XA Unspecified injury of pelvis, initial encounter: Secondary | ICD-10-CM | POA: Diagnosis not present

## 2022-06-17 DIAGNOSIS — S299XXA Unspecified injury of thorax, initial encounter: Secondary | ICD-10-CM | POA: Diagnosis not present

## 2022-06-17 DIAGNOSIS — W06XXXA Fall from bed, initial encounter: Secondary | ICD-10-CM | POA: Insufficient documentation

## 2022-06-17 DIAGNOSIS — Z043 Encounter for examination and observation following other accident: Secondary | ICD-10-CM | POA: Diagnosis not present

## 2022-06-17 DIAGNOSIS — G8190 Hemiplegia, unspecified affecting unspecified side: Secondary | ICD-10-CM | POA: Diagnosis not present

## 2022-06-17 DIAGNOSIS — R4182 Altered mental status, unspecified: Secondary | ICD-10-CM | POA: Diagnosis not present

## 2022-06-17 DIAGNOSIS — W19XXXA Unspecified fall, initial encounter: Secondary | ICD-10-CM

## 2022-06-17 DIAGNOSIS — Z86011 Personal history of benign neoplasm of the brain: Secondary | ICD-10-CM | POA: Insufficient documentation

## 2022-06-17 DIAGNOSIS — R5381 Other malaise: Secondary | ICD-10-CM | POA: Diagnosis not present

## 2022-06-17 DIAGNOSIS — R918 Other nonspecific abnormal finding of lung field: Secondary | ICD-10-CM | POA: Diagnosis not present

## 2022-06-17 DIAGNOSIS — R5383 Other fatigue: Secondary | ICD-10-CM | POA: Diagnosis not present

## 2022-06-17 LAB — BASIC METABOLIC PANEL
Anion gap: 6 (ref 5–15)
BUN: 12 mg/dL (ref 8–23)
CO2: 28 mmol/L (ref 22–32)
Calcium: 9 mg/dL (ref 8.9–10.3)
Chloride: 109 mmol/L (ref 98–111)
Creatinine, Ser: 0.7 mg/dL (ref 0.61–1.24)
GFR, Estimated: >60 mL/min (ref >60)
Glucose, Bld: 81 mg/dL (ref 70–99)
Potassium: 3.6 mmol/L (ref 3.5–5.1)
Sodium: 143 mmol/L (ref 135–145)

## 2022-06-17 LAB — CBC
HCT: 35.4 % — ABNORMAL LOW (ref 39.0–52.0)
Hemoglobin: 11 g/dL — ABNORMAL LOW (ref 13.0–17.0)
MCH: 28.2 pg (ref 26.0–34.0)
MCHC: 31.1 g/dL (ref 30.0–36.0)
MCV: 90.8 fL (ref 80.0–100.0)
Platelets: 264 10*3/uL (ref 150–400)
RBC: 3.9 MIL/uL — ABNORMAL LOW (ref 4.22–5.81)
RDW: 15.2 % (ref 11.5–15.5)
WBC: 4.5 10*3/uL (ref 4.0–10.5)
nRBC: 0 % (ref 0.0–0.2)

## 2022-06-17 LAB — PROTIME-INR
INR: 1.1 (ref 0.8–1.2)
Prothrombin Time: 13.9 seconds (ref 11.4–15.2)

## 2022-06-17 NOTE — ED Provider Notes (Signed)
[facility] DEPT Provider Note   CSN: [id number] Arrival date & time: 06/17/22  0881     History  Chief Complaint  Patient presents with   Lytle Michaels    NASIM HABEEB is a 71 y.o. male.  Patient sent to the emergency department from skilled nursing facility.  Patient found lying next to his bed, it was presumed that he fell out of bed.  At arrival to the ER, patient does not remember falling.  He does not have any specific complaints.       Home Medications Prior to Admission medications   Medication Sig Start Date End Date Taking? Authorizing Provider  acetaminophen (TYLENOL) 500 MG tablet Take 1,000 mg by mouth 2 (two) times daily.   Yes [provider]  ALPRAZolam (XANAX) 0.5 MG tablet Take 1 tablet (0.5 mg total) by mouth 2 (two) times daily as needed for anxiety. 05/13/22  Yes Thurnell Lose, MD  ARIPiprazole (ABILIFY) 10 MG tablet Take 10 mg by mouth daily. 06/09/22  Yes [provider]  baclofen (LIORESAL) 10 MG tablet Take 1 tablet (10 mg total) by mouth 3 (three) times daily. 04/24/22  Yes Boscia, Greer Ee, NP  bisacodyl (DULCOLAX) 10 MG suppository Place 1 suppository (10 mg total) rectally daily as needed for moderate constipation. 05/13/22  Yes Thurnell Lose, MD  Cholecalciferol (VITAMIN D3 PO) Take 1 capsule by mouth daily.   Yes [provider]  diclofenac Sodium (VOLTAREN) 1 % GEL Apply 2 g topically 3 (three) times daily. 03/30/22  Yes Setzer, Edman Circle, PA-C  escitalopram (LEXAPRO) 20 MG tablet Take 1 tablet (20 mg total) by mouth daily. 04/24/22  Yes Boscia, Greer Ee, NP  gabapentin (NEURONTIN) 100 MG capsule Take 1 capsule (100 mg total) by mouth 2 (two) times daily. 04/24/22  Yes Boscia, Heather E, NP  gabapentin (NEURONTIN) 300 MG capsule Take 300 mg by mouth daily. 05/16/22  Yes [provider]  ketoconazole (NIZORAL) 2 % cream Apply 1 Application topically daily. 06/14/22  Yes [provider]  ketoconazole (NIZORAL) 2 % shampoo Apply 1 Application topically. Every 20 days 06/14/22  Yes [provider]  lacosamide 100 MG TABS Take 1 tablet (100 mg total) by mouth 2 (two) times daily. 05/13/22  Yes Thurnell Lose, MD  lamoTRIgine (LAMICTAL) 100 MG tablet Take 1 tablet (100 mg total) by mouth 2 (two) times daily. 04/24/22  Yes Ronnell Freshwater, NP  linaclotide (LINZESS) 145 MCG CAPS capsule Take 1 capsule (145 mcg total) by mouth daily before breakfast. 04/24/22  Yes Boscia, Heather E, NP  magnesium gluconate (MAGONATE) 500 MG tablet Take 0.5 tablets (250 mg total) by mouth at bedtime. Patient taking differently: Take 250 mg by mouth every other day. 04/24/22  Yes Boscia, Greer Ee, NP  magnesium hydroxide (MILK OF MAGNESIA) 400 MG/5ML suspension Take 30 mLs by mouth daily as needed for mild constipation.   Yes [provider]  Multiple Vitamin (MULTIVITAMIN) tablet Take 1 tablet by mouth daily.   Yes [provider]  pantoprazole (PROTONIX) 40 MG tablet Take 1 tablet (40 mg total) by mouth at bedtime. 04/24/22  Yes Boscia, Greer Ee, NP  polyvinyl alcohol (LIQUIFILM TEARS) 1.4 % ophthalmic solution Place 1 drop into both eyes as needed for dry eyes.   Yes [provider]  QUEtiapine (SEROQUEL) 50 MG tablet Take 1 tablet (50 mg total) by mouth at bedtime. 05/13/22  Yes Thurnell Lose, MD  rosuvastatin (  CRESTOR) 20 MG tablet Take 1 tablet (20 mg total) by mouth daily. 04/24/22  Yes Boscia, Heather E, NP  senna-docusate (SENOKOT-S) 8.6-50 MG tablet Take 2 tablets by mouth at bedtime. 03/30/22  Yes Setzer, Edman Circle, PA-C  tamsulosin (FLOMAX) 0.4 MG CAPS capsule Take 1 capsule (0.4 mg total) by mouth daily after supper. 05/13/22  Yes Thurnell Lose, MD  traZODone (DESYREL) 50 MG tablet Take 0.5-1 tablets (25-50 mg total) by mouth at bedtime as needed for sleep. 04/24/22  Yes Boscia, Greer Ee, NP  Vitamin D, Ergocalciferol, (DRISDOL) 1.25 MG (50000 UNIT) CAPS  capsule Take 1 capsule (50,000 Units total) by mouth every Sunday. Take one tablet wkly 04/30/22  Yes Boscia, Greer Ee, NP  HYDROcodone-acetaminophen (NORCO/VICODIN) 5-325 MG tablet Take by mouth. Patient not taking: Reported on 06/17/2022 05/17/22   [provider]      Allergies    Other    Review of Systems   Review of Systems  Physical Exam Updated Vital Signs BP 105/74   Pulse 80   Temp 97.8 F (36.6 C) (Oral)   Resp 18   SpO2 100%  Physical Exam Vitals and nursing note reviewed.  Constitutional:      General: He is not in acute distress.    Appearance: He is well-developed.  HENT:     Head: Normocephalic and atraumatic.     Mouth/Throat:     Mouth: Mucous membranes are moist.  Eyes:     General: Vision grossly intact. Gaze aligned appropriately.     Extraocular Movements: Extraocular movements intact.     Conjunctiva/sclera: Conjunctivae normal.  Cardiovascular:     Rate and Rhythm: Normal rate and regular rhythm.     Pulses: Normal pulses.     Heart sounds: Normal heart sounds, S1 normal and S2 normal. No murmur heard.    No friction rub. No gallop.  Pulmonary:     Effort: Pulmonary effort is normal. No respiratory distress.     Breath sounds: Normal breath sounds.  Abdominal:     Palpations: Abdomen is soft.     Tenderness: There is no abdominal tenderness. There is no guarding or rebound.     Hernia: No hernia is present.  Musculoskeletal:        General: No swelling.     Cervical back: Full passive range of motion without pain, normal range of motion and neck supple. No pain with movement, spinous process tenderness or muscular tenderness. Normal range of motion.     Right lower leg: No edema.     Left lower leg: No edema.  Skin:    General: Skin is warm and dry.     Capillary Refill: Capillary refill takes less than 2 seconds.     Findings: No ecchymosis, erythema, lesion or wound.  Neurological:     General: No focal deficit present.     Mental  Status: He is alert. Mental status is at baseline.     GCS: GCS eye subscore is 4. GCS verbal subscore is 5. GCS motor subscore is 6.     Cranial Nerves: Cranial nerves 2-12 are intact.     Sensory: Sensation is intact.     Motor: Motor function is intact. No weakness or abnormal muscle tone.     Coordination: Coordination is intact.     ED Results / Procedures / Treatments   Labs (all labs ordered are listed, but only abnormal results are displayed) Labs Reviewed  CBC - Abnormal; Notable for  the following components:      Result Value   RBC 3.90 (*)    Hemoglobin 11.0 (*)    HCT 35.4 (*)    All other components within normal limits  BASIC METABOLIC PANEL  PROTIME-INR    EKG None  Radiology DG Pelvis Portable  Result Date: 06/17/2022 CLINICAL DATA:  71 year old male with history of trauma from a fall today. EXAM: PORTABLE PELVIS 1-2 VIEWS COMPARISON:  No priors. FINDINGS: Single rotated view of the pelvis is submitted for evaluation. Leftward patient rotation slightly limits assessment. With these limitations in mind, the bony pelvic ring appears intact, as do the visualized portions of the proximal femurs bilaterally. Femoral heads appear located on this single view examination. Joint space narrowing, subchondral sclerosis and osteophyte formation is noted in the hip joints bilaterally, indicative of moderate osteoarthritis. IMPRESSION: 1. No acute abnormality of the bony pelvis. 2. Moderate bilateral hip joint osteoarthritis. Electronically Signed   By: Vinnie Langton M.D.   On: 06/17/2022 05:22   DG Chest Port 1 View  Result Date: 06/17/2022 CLINICAL DATA:  71 year old male with history of trauma from a fall. EXAM: PORTABLE CHEST 1 VIEW COMPARISON:  No priors. FINDINGS: Lung volumes are slightly low with elevation of the right hemidiaphragm. Linear opacities in the right mid lung and periphery of the left lung base may reflect areas of subsegmental atelectasis and/or scarring.  No consolidative airspace disease. No pleural effusions. No pneumothorax. No pulmonary nodule or mass noted. Pulmonary vasculature and the cardiomediastinal silhouette are within normal limits. Atherosclerotic calcifications in the thoracic aorta. IMPRESSION: 1. Low lung volumes with mild elevation of the right hemidiaphragm. 2. Aortic atherosclerosis. Electronically Signed   By: Vinnie Langton M.D.   On: 06/17/2022 05:20   CT HEAD WO CONTRAST (5MM)  Result Date: 06/17/2022 CLINICAL DATA:  71 year old male with history of trauma after falling out of bed. EXAM: CT HEAD WITHOUT CONTRAST CT CERVICAL SPINE WITHOUT CONTRAST TECHNIQUE: Multidetector CT imaging of the head and cervical spine was performed following the standard protocol without intravenous contrast. Multiplanar CT image reconstructions of the cervical spine were also generated. RADIATION DOSE REDUCTION: This exam was performed according to the departmental dose-optimization program which includes automated exposure control, adjustment of the mA and/or kV according to patient size and/or use of iterative reconstruction technique. COMPARISON:  Head CT 05/29/2022. FINDINGS: CT HEAD FINDINGS Brain: Again noted are multiple extra-axial high attenuation lesions, similar in size and number to the recent prior examinations, presumably multiple meningiomas. The largest of these in the floor of the left middle cranial fossa measures approximately 4.5 x 2.6 x 3.0 cm (axial image 18 of series 3 and coronal image 30 of series 5), with probable involvement of the left cavernous sinus and surrounding low-attenuation, presumably vasogenic edema, causing some very mild mass effect. Other dominant lesion in the inferior aspect of the left parieto-occipital region measures approximately 3.4 x 2.9 x 4.0 cm, and is also surrounded by some edema. This results in minimal (3 mm) of left-to-right midline shift, stable to slightly decreased compared to the prior study.  Several other smaller lesions are also noted elsewhere, stable in size and number compared to the prior examination. Some low attenuation in the right vertex is similar to prior examinations, presumably chronic postoperative encephalomalacia. No definite evidence of acute infarct, new intra or extra-axial lesions or fluid collections, or hydrocephalus. Vascular: No hyperdense vessel or unexpected calcification. Distal right ICA region aneurysm clip. Skull: Status post right frontotemporal and  right parieto-occipital vertex craniotomies. Sinuses/Orbits: No acute finding. Other: None. CT CERVICAL SPINE FINDINGS Alignment: Normal. Skull base and vertebrae: No acute fracture. No primary bone lesion or focal pathologic process. Status post laminectomy from C6-T1. Soft tissues and spinal canal: No prevertebral fluid or swelling. No visible canal hematoma. Disc levels: Mild multilevel degenerative disc disease, most pronounced at C6-C7 and C7-T1. Mild multilevel facet arthropathy bilaterally. Upper chest: Negative. Other: None. IMPRESSION: 1. No evidence of significant acute traumatic injury to the skull, brain or cervical spine. 2. Chronic imaging stigmata of meningiomatosis redemonstrated, as above, very similar to the recent prior study. 3. Multilevel degenerative disc disease and cervical spondylosis, as above, status post laminectomy from C6-T1. Electronically Signed   By: Vinnie Langton M.D.   On: 06/17/2022 05:19   CT CERVICAL SPINE WO CONTRAST  Result Date: 06/17/2022 CLINICAL DATA:  71 year old male with history of trauma after falling out of bed. EXAM: CT HEAD WITHOUT CONTRAST CT CERVICAL SPINE WITHOUT CONTRAST TECHNIQUE: Multidetector CT imaging of the head and cervical spine was performed following the standard protocol without intravenous contrast. Multiplanar CT image reconstructions of the cervical spine were also generated. RADIATION DOSE REDUCTION: This exam was performed according to the  departmental dose-optimization program which includes automated exposure control, adjustment of the mA and/or kV according to patient size and/or use of iterative reconstruction technique. COMPARISON:  Head CT 05/29/2022. FINDINGS: CT HEAD FINDINGS Brain: Again noted are multiple extra-axial high attenuation lesions, similar in size and number to the recent prior examinations, presumably multiple meningiomas. The largest of these in the floor of the left middle cranial fossa measures approximately 4.5 x 2.6 x 3.0 cm (axial image 18 of series 3 and coronal image 30 of series 5), with probable involvement of the left cavernous sinus and surrounding low-attenuation, presumably vasogenic edema, causing some very mild mass effect. Other dominant lesion in the inferior aspect of the left parieto-occipital region measures approximately 3.4 x 2.9 x 4.0 cm, and is also surrounded by some edema. This results in minimal (3 mm) of left-to-right midline shift, stable to slightly decreased compared to the prior study. Several other smaller lesions are also noted elsewhere, stable in size and number compared to the prior examination. Some low attenuation in the right vertex is similar to prior examinations, presumably chronic postoperative encephalomalacia. No definite evidence of acute infarct, new intra or extra-axial lesions or fluid collections, or hydrocephalus. Vascular: No hyperdense vessel or unexpected calcification. Distal right ICA region aneurysm clip. Skull: Status post right frontotemporal and right parieto-occipital vertex craniotomies. Sinuses/Orbits: No acute finding. Other: None. CT CERVICAL SPINE FINDINGS Alignment: Normal. Skull base and vertebrae: No acute fracture. No primary bone lesion or focal pathologic process. Status post laminectomy from C6-T1. Soft tissues and spinal canal: No prevertebral fluid or swelling. No visible canal hematoma. Disc levels: Mild multilevel degenerative disc disease, most  pronounced at C6-C7 and C7-T1. Mild multilevel facet arthropathy bilaterally. Upper chest: Negative. Other: None. IMPRESSION: 1. No evidence of significant acute traumatic injury to the skull, brain or cervical spine. 2. Chronic imaging stigmata of meningiomatosis redemonstrated, as above, very similar to the recent prior study. 3. Multilevel degenerative disc disease and cervical spondylosis, as above, status post laminectomy from C6-T1. Electronically Signed   By: Vinnie Langton M.D.   On: 06/17/2022 05:19    Procedures Procedures    Medications Ordered in ED Medications - No data to display  ED Course/ Medical Decision Making/ A&P  Medical Decision Making Amount and/or Complexity of Data Reviewed Labs: ordered. Radiology: ordered.   Presents to the emergency department for evaluation of unwitnessed fall.  Patient appears to have some baseline cognitive decline.  He does not remember falling but is not experiencing any specific complaints.  CT head and cervical spine performed.  Patient without acute injury.  Chest and pelvis x-rays negative.  No obvious extremity injury.  Patient will be returned to nursing home.        Final Clinical Impression(s) / ED Diagnoses Final diagnoses:  Fall, initial encounter    Rx / DC Orders ED Discharge Orders     None         Lolamae Voisin, Gwenyth Allegra, MD 06/17/22 0710

## 2022-06-17 NOTE — ED Triage Notes (Signed)
BIBA from [facility] s/p fall Pt unable to recall even, staff reports unwitnessed fall out of bed.  Denies pain.

## 2022-06-19 DIAGNOSIS — Z9989 Dependence on other enabling machines and devices: Secondary | ICD-10-CM | POA: Diagnosis not present

## 2022-06-19 DIAGNOSIS — D429 Neoplasm of uncertain behavior of meninges, unspecified: Secondary | ICD-10-CM | POA: Diagnosis not present

## 2022-06-19 DIAGNOSIS — G936 Cerebral edema: Secondary | ICD-10-CM | POA: Diagnosis not present

## 2022-06-19 DIAGNOSIS — M79602 Pain in left arm: Secondary | ICD-10-CM | POA: Diagnosis not present

## 2022-06-19 DIAGNOSIS — R296 Repeated falls: Secondary | ICD-10-CM | POA: Diagnosis not present

## 2022-06-19 DIAGNOSIS — M6281 Muscle weakness (generalized): Secondary | ICD-10-CM | POA: Diagnosis not present

## 2022-06-19 DIAGNOSIS — G4720 Circadian rhythm sleep disorder, unspecified type: Secondary | ICD-10-CM | POA: Diagnosis not present

## 2022-06-19 DIAGNOSIS — G9389 Other specified disorders of brain: Secondary | ICD-10-CM | POA: Diagnosis not present

## 2022-06-19 DIAGNOSIS — G9341 Metabolic encephalopathy: Secondary | ICD-10-CM | POA: Diagnosis not present

## 2022-06-19 DIAGNOSIS — R1310 Dysphagia, unspecified: Secondary | ICD-10-CM | POA: Diagnosis not present

## 2022-06-19 DIAGNOSIS — G8194 Hemiplegia, unspecified affecting left nondominant side: Secondary | ICD-10-CM | POA: Diagnosis not present

## 2022-06-20 DIAGNOSIS — G936 Cerebral edema: Secondary | ICD-10-CM | POA: Diagnosis not present

## 2022-06-20 DIAGNOSIS — N390 Urinary tract infection, site not specified: Secondary | ICD-10-CM | POA: Diagnosis not present

## 2022-06-20 DIAGNOSIS — G9341 Metabolic encephalopathy: Secondary | ICD-10-CM | POA: Diagnosis not present

## 2022-06-20 DIAGNOSIS — R1310 Dysphagia, unspecified: Secondary | ICD-10-CM | POA: Diagnosis not present

## 2022-06-20 DIAGNOSIS — M6281 Muscle weakness (generalized): Secondary | ICD-10-CM | POA: Diagnosis not present

## 2022-06-21 ENCOUNTER — Telehealth: Payer: Self-pay | Admitting: *Deleted

## 2022-06-21 DIAGNOSIS — G936 Cerebral edema: Secondary | ICD-10-CM | POA: Diagnosis not present

## 2022-06-21 DIAGNOSIS — R1310 Dysphagia, unspecified: Secondary | ICD-10-CM | POA: Diagnosis not present

## 2022-06-21 DIAGNOSIS — G9341 Metabolic encephalopathy: Secondary | ICD-10-CM | POA: Diagnosis not present

## 2022-06-21 DIAGNOSIS — M6281 Muscle weakness (generalized): Secondary | ICD-10-CM | POA: Diagnosis not present

## 2022-06-21 NOTE — Telephone Encounter (Signed)
     Patient  visit on 06/17/2022  at  [facility] long ed  was for fall  Have you been able to follow up with your primary care physician?  Patient was returned to nursing home, says he is feeling better after recent falls  The patient was able to obtain any needed medicine or equipment.  Are there diet recommendations that you are having difficulty following?  Patient expresses understanding of discharge instructions and education provided has no other needs at this time.    [facility] [phone] [address] , [place] [address] Email : [email or web address]

## 2022-06-22 DIAGNOSIS — G936 Cerebral edema: Secondary | ICD-10-CM | POA: Diagnosis not present

## 2022-06-22 DIAGNOSIS — D329 Benign neoplasm of meninges, unspecified: Secondary | ICD-10-CM | POA: Diagnosis not present

## 2022-06-22 DIAGNOSIS — R1310 Dysphagia, unspecified: Secondary | ICD-10-CM | POA: Diagnosis not present

## 2022-06-22 DIAGNOSIS — G9341 Metabolic encephalopathy: Secondary | ICD-10-CM | POA: Diagnosis not present

## 2022-06-22 DIAGNOSIS — M6281 Muscle weakness (generalized): Secondary | ICD-10-CM | POA: Diagnosis not present

## 2022-06-23 DIAGNOSIS — R1310 Dysphagia, unspecified: Secondary | ICD-10-CM | POA: Diagnosis not present

## 2022-06-23 DIAGNOSIS — G936 Cerebral edema: Secondary | ICD-10-CM | POA: Diagnosis not present

## 2022-06-23 DIAGNOSIS — M6281 Muscle weakness (generalized): Secondary | ICD-10-CM | POA: Diagnosis not present

## 2022-06-23 DIAGNOSIS — G9341 Metabolic encephalopathy: Secondary | ICD-10-CM | POA: Diagnosis not present

## 2022-06-26 DIAGNOSIS — G936 Cerebral edema: Secondary | ICD-10-CM | POA: Diagnosis not present

## 2022-06-26 DIAGNOSIS — M6281 Muscle weakness (generalized): Secondary | ICD-10-CM | POA: Diagnosis not present

## 2022-06-26 DIAGNOSIS — R1310 Dysphagia, unspecified: Secondary | ICD-10-CM | POA: Diagnosis not present

## 2022-06-26 DIAGNOSIS — G9341 Metabolic encephalopathy: Secondary | ICD-10-CM | POA: Diagnosis not present

## 2022-06-27 DIAGNOSIS — F331 Major depressive disorder, recurrent, moderate: Secondary | ICD-10-CM | POA: Diagnosis not present

## 2022-06-27 DIAGNOSIS — G9341 Metabolic encephalopathy: Secondary | ICD-10-CM | POA: Diagnosis not present

## 2022-06-27 DIAGNOSIS — M6281 Muscle weakness (generalized): Secondary | ICD-10-CM | POA: Diagnosis not present

## 2022-06-27 DIAGNOSIS — G936 Cerebral edema: Secondary | ICD-10-CM | POA: Diagnosis not present

## 2022-06-27 DIAGNOSIS — R1310 Dysphagia, unspecified: Secondary | ICD-10-CM | POA: Diagnosis not present

## 2022-06-28 DIAGNOSIS — R1310 Dysphagia, unspecified: Secondary | ICD-10-CM | POA: Diagnosis not present

## 2022-06-28 DIAGNOSIS — G936 Cerebral edema: Secondary | ICD-10-CM | POA: Diagnosis not present

## 2022-06-28 DIAGNOSIS — M6281 Muscle weakness (generalized): Secondary | ICD-10-CM | POA: Diagnosis not present

## 2022-06-28 DIAGNOSIS — G9341 Metabolic encephalopathy: Secondary | ICD-10-CM | POA: Diagnosis not present

## 2022-06-29 ENCOUNTER — Encounter: Payer: Self-pay | Admitting: Physician Assistant

## 2022-06-29 DIAGNOSIS — G9341 Metabolic encephalopathy: Secondary | ICD-10-CM | POA: Diagnosis not present

## 2022-06-29 DIAGNOSIS — M6281 Muscle weakness (generalized): Secondary | ICD-10-CM | POA: Diagnosis not present

## 2022-06-29 DIAGNOSIS — G936 Cerebral edema: Secondary | ICD-10-CM | POA: Diagnosis not present

## 2022-06-29 DIAGNOSIS — R1310 Dysphagia, unspecified: Secondary | ICD-10-CM | POA: Diagnosis not present

## 2022-06-30 DIAGNOSIS — M6281 Muscle weakness (generalized): Secondary | ICD-10-CM | POA: Diagnosis not present

## 2022-06-30 DIAGNOSIS — G9341 Metabolic encephalopathy: Secondary | ICD-10-CM | POA: Diagnosis not present

## 2022-06-30 DIAGNOSIS — R1310 Dysphagia, unspecified: Secondary | ICD-10-CM | POA: Diagnosis not present

## 2022-06-30 DIAGNOSIS — G936 Cerebral edema: Secondary | ICD-10-CM | POA: Diagnosis not present

## 2022-07-03 DIAGNOSIS — R1310 Dysphagia, unspecified: Secondary | ICD-10-CM | POA: Diagnosis not present

## 2022-07-03 DIAGNOSIS — G9389 Other specified disorders of brain: Secondary | ICD-10-CM | POA: Diagnosis not present

## 2022-07-03 DIAGNOSIS — M79602 Pain in left arm: Secondary | ICD-10-CM | POA: Diagnosis not present

## 2022-07-03 DIAGNOSIS — M6281 Muscle weakness (generalized): Secondary | ICD-10-CM | POA: Diagnosis not present

## 2022-07-03 DIAGNOSIS — G9341 Metabolic encephalopathy: Secondary | ICD-10-CM | POA: Diagnosis not present

## 2022-07-03 DIAGNOSIS — G8194 Hemiplegia, unspecified affecting left nondominant side: Secondary | ICD-10-CM | POA: Diagnosis not present

## 2022-07-03 DIAGNOSIS — G936 Cerebral edema: Secondary | ICD-10-CM | POA: Diagnosis not present

## 2022-07-03 DIAGNOSIS — D429 Neoplasm of uncertain behavior of meninges, unspecified: Secondary | ICD-10-CM | POA: Diagnosis not present

## 2022-07-04 DIAGNOSIS — R1310 Dysphagia, unspecified: Secondary | ICD-10-CM | POA: Diagnosis not present

## 2022-07-04 DIAGNOSIS — M6281 Muscle weakness (generalized): Secondary | ICD-10-CM | POA: Diagnosis not present

## 2022-07-04 DIAGNOSIS — G9341 Metabolic encephalopathy: Secondary | ICD-10-CM | POA: Diagnosis not present

## 2022-07-04 DIAGNOSIS — G936 Cerebral edema: Secondary | ICD-10-CM | POA: Diagnosis not present

## 2022-07-05 DIAGNOSIS — G9341 Metabolic encephalopathy: Secondary | ICD-10-CM | POA: Diagnosis not present

## 2022-07-05 DIAGNOSIS — R1310 Dysphagia, unspecified: Secondary | ICD-10-CM | POA: Diagnosis not present

## 2022-07-05 DIAGNOSIS — M6281 Muscle weakness (generalized): Secondary | ICD-10-CM | POA: Diagnosis not present

## 2022-07-05 DIAGNOSIS — G936 Cerebral edema: Secondary | ICD-10-CM | POA: Diagnosis not present

## 2022-07-06 DIAGNOSIS — G9341 Metabolic encephalopathy: Secondary | ICD-10-CM | POA: Diagnosis not present

## 2022-07-06 DIAGNOSIS — G936 Cerebral edema: Secondary | ICD-10-CM | POA: Diagnosis not present

## 2022-07-06 DIAGNOSIS — M6281 Muscle weakness (generalized): Secondary | ICD-10-CM | POA: Diagnosis not present

## 2022-07-06 DIAGNOSIS — R1310 Dysphagia, unspecified: Secondary | ICD-10-CM | POA: Diagnosis not present

## 2022-07-16 ENCOUNTER — Emergency Department (HOSPITAL_BASED_OUTPATIENT_CLINIC_OR_DEPARTMENT_OTHER): Payer: Medicare HMO | Admitting: Radiology

## 2022-07-16 ENCOUNTER — Encounter (HOSPITAL_BASED_OUTPATIENT_CLINIC_OR_DEPARTMENT_OTHER): Payer: Self-pay

## 2022-07-16 ENCOUNTER — Emergency Department (HOSPITAL_BASED_OUTPATIENT_CLINIC_OR_DEPARTMENT_OTHER)
Admission: EM | Admit: 2022-07-16 | Discharge: 2022-07-16 | Disposition: A | Discharge status: Home / Self Care | Payer: Medicare HMO | Attending: Emergency Medicine | Admitting: Emergency Medicine

## 2022-07-16 DIAGNOSIS — Z7401 Bed confinement status: Secondary | ICD-10-CM | POA: Diagnosis not present

## 2022-07-16 DIAGNOSIS — M549 Dorsalgia, unspecified: Secondary | ICD-10-CM | POA: Diagnosis not present

## 2022-07-16 DIAGNOSIS — M545 Low back pain, unspecified: Secondary | ICD-10-CM | POA: Insufficient documentation

## 2022-07-16 MED ORDER — LIDOCAINE 4 % EX PTCH: 1.0000 | MEDICATED_PATCH | CUTANEOUS | 0 refills | Status: AC

## 2022-07-16 MED ORDER — OXYCODONE HCL 5 MG PO TABS
5.0000 mg | ORAL_TABLET | ORAL | Status: AC
  Administered 2022-07-16: 5 mg via ORAL
  Filled 2022-07-16: qty 1

## 2022-07-16 MED ORDER — LIDOCAINE 5 % EX PTCH
1.0000 | MEDICATED_PATCH | CUTANEOUS | Status: DC
  Administered 2022-07-16: 1 via TRANSDERMAL
  Filled 2022-07-16: qty 1

## 2022-07-16 MED ORDER — ACETAMINOPHEN 325 MG PO TABS
650.0000 mg | ORAL_TABLET | Freq: Once | ORAL | Status: AC
  Administered 2022-07-16: 650 mg via ORAL
  Filled 2022-07-16: qty 2

## 2022-07-16 NOTE — ED Provider Notes (Signed)
[facility] EMERGENCY DEPT Provider Note   CSN: [id number] Arrival date & time: 07/16/22  4665     History  Chief Complaint  Patient presents with   Back Pain    Patrick Buchanan is a 71 y.o. male.  71 year old male with a history of a brain tumor status postresection with chronic left upper and left lower extremity paralysis presents emergency department with back pain.  States that last night he was sitting up to eat and felt a pop in the left side of his lumbar spine.  Says that it started hurting but does not radiate to his legs.  Says that he is incontinent of bowel and bladder at baseline but has not noticed any weakness or numbness of his right lower extremity which is still fully functional.  Says that he was given Norco for his pain last night but with his persistent pain was brought into the emergency department for evaluation.  Currently resides at [facility].        Home Medications Prior to Admission medications   Medication Sig Start Date End Date Taking? Authorizing Provider  lidocaine (HM LIDOCAINE PATCH) 4 % Place 1 patch onto the skin daily for 14 days. 07/16/22 07/30/22 Yes Fransico Meadow, MD  acetaminophen (TYLENOL) 500 MG tablet Take 1,000 mg by mouth 2 (two) times daily.    [provider]  ALPRAZolam Duanne Moron) 0.5 MG tablet Take 1 tablet (0.5 mg total) by mouth 2 (two) times daily as needed for anxiety. 05/13/22   Thurnell Lose, MD  ARIPiprazole (ABILIFY) 10 MG tablet Take 10 mg by mouth daily. 06/09/22   [provider]  baclofen (LIORESAL) 10 MG tablet Take 1 tablet (10 mg total) by mouth 3 (three) times daily. 04/24/22   Ronnell Freshwater, NP  bisacodyl (DULCOLAX) 10 MG suppository Place 1 suppository (10 mg total) rectally daily as needed for moderate constipation. 05/13/22   Thurnell Lose, MD  Cholecalciferol (VITAMIN D3 PO) Take 1 capsule by mouth daily.    [provider]   diclofenac Sodium (VOLTAREN) 1 % GEL Apply 2 g topically 3 (three) times daily. 03/30/22   Setzer, Edman Circle, PA-C  escitalopram (LEXAPRO) 20 MG tablet Take 1 tablet (20 mg total) by mouth daily. 04/24/22   Ronnell Freshwater, NP  gabapentin (NEURONTIN) 100 MG capsule Take 1 capsule (100 mg total) by mouth 2 (two) times daily. 04/24/22   Ronnell Freshwater, NP  gabapentin (NEURONTIN) 300 MG capsule Take 300 mg by mouth daily. 05/16/22   [provider]  HYDROcodone-acetaminophen (NORCO/VICODIN) 5-325 MG tablet Take by mouth. Patient not taking: Reported on 06/17/2022 05/17/22   [provider]  ketoconazole (NIZORAL) 2 % cream Apply 1 Application topically daily. 06/14/22   [provider]  ketoconazole (NIZORAL) 2 % shampoo Apply 1 Application topically. Every 20 days 06/14/22   [provider]  lacosamide 100 MG TABS Take 1 tablet (100 mg total) by mouth 2 (two) times daily. 05/13/22   Thurnell Lose, MD  lamoTRIgine (LAMICTAL) 100 MG tablet Take 1 tablet (100 mg total) by mouth 2 (two) times daily. 04/24/22   Ronnell Freshwater, NP  linaclotide (LINZESS) 145 MCG CAPS capsule Take 1 capsule (145 mcg total) by mouth daily before breakfast. 04/24/22   Ronnell Freshwater, NP  magnesium gluconate (MAGONATE) 500 MG tablet Take 0.5 tablets (250 mg total) by mouth at bedtime. Patient taking differently: Take 250 mg by mouth  every other day. 04/24/22   Ronnell Freshwater, NP  magnesium hydroxide (MILK OF MAGNESIA) 400 MG/5ML suspension Take 30 mLs by mouth daily as needed for mild constipation.    [provider]  Multiple Vitamin (MULTIVITAMIN) tablet Take 1 tablet by mouth daily.    [provider]  pantoprazole (PROTONIX) 40 MG tablet Take 1 tablet (40 mg total) by mouth at bedtime. 04/24/22   Ronnell Freshwater, NP  polyvinyl alcohol (LIQUIFILM TEARS) 1.4 % ophthalmic solution Place 1 drop into both eyes as needed for dry eyes.    [provider]  QUEtiapine  (SEROQUEL) 50 MG tablet Take 1 tablet (50 mg total) by mouth at bedtime. 05/13/22   Thurnell Lose, MD  rosuvastatin (CRESTOR) 20 MG tablet Take 1 tablet (20 mg total) by mouth daily. 04/24/22   Ronnell Freshwater, NP  senna-docusate (SENOKOT-S) 8.6-50 MG tablet Take 2 tablets by mouth at bedtime. 03/30/22   Setzer, Edman Circle, PA-C  tamsulosin (FLOMAX) 0.4 MG CAPS capsule Take 1 capsule (0.4 mg total) by mouth daily after supper. 05/13/22   Thurnell Lose, MD  traZODone (DESYREL) 50 MG tablet Take 0.5-1 tablets (25-50 mg total) by mouth at bedtime as needed for sleep. 04/24/22   Ronnell Freshwater, NP  Vitamin D, Ergocalciferol, (DRISDOL) 1.25 MG (50000 UNIT) CAPS capsule Take 1 capsule (50,000 Units total) by mouth every Sunday. Take one tablet wkly 04/30/22   Ronnell Freshwater, NP      Allergies    Other    Review of Systems   Review of Systems  Physical Exam Updated Vital Signs BP 139/72   Pulse 83   Temp (!) 97.5 F (36.4 C) (Oral)   Resp 16   SpO2 93%  Physical Exam Vitals and nursing note reviewed.  Constitutional:      General: He is not in acute distress.    Appearance: He is well-developed.  HENT:     Head: Normocephalic and atraumatic.     Right Ear: External ear normal.     Left Ear: External ear normal.     Nose: Nose normal.  Eyes:     Extraocular Movements: Extraocular movements intact.     Conjunctiva/sclera: Conjunctivae normal.     Pupils: Pupils are equal, round, and reactive to light.  Cardiovascular:     Rate and Rhythm: Normal rate.  Pulmonary:     Effort: Pulmonary effort is normal. No respiratory distress.  Abdominal:     General: There is no distension.     Palpations: Abdomen is soft.  Musculoskeletal:        General: No swelling.     Cervical back: Normal range of motion and neck supple.     Right lower leg: No edema.     Left lower leg: No edema.     Comments: No step-offs or midline tenderness to palpation of cervical, thoracic, or lumbar spine.   Left-sided paraspinal pain at L2 level.  No bruising or deformities.  5/5 strength in right lower extremity hip flexion, ankle dorsi and plantarflexion, great toe dorsiflexion.  Intact sensation to light touch of all dermatomes of the right lower extremity.  Skin:    General: Skin is warm and dry.     Capillary Refill: Capillary refill takes less than 2 seconds.  Neurological:     Mental Status: He is alert. Mental status is at baseline.  Psychiatric:        Mood and Affect: Mood normal.  Behavior: Behavior normal.     ED Results / Procedures / Treatments   Labs (all labs ordered are listed, but only abnormal results are displayed) Labs Reviewed - No data to display  EKG None  Radiology DG Lumbar Spine 2-3 Views  Result Date: 07/16/2022 CLINICAL DATA:  Pain. EXAM: LUMBAR SPINE - 2-3 VIEW COMPARISON:  CT scan of the abdomen and pelvis May 29, 2022. FINDINGS: No fracture or traumatic malalignment. Multilevel degenerative disc disease and lower lumbar facet degenerative changes. Calcified atherosclerotic changes are identified in the abdominal aorta. IMPRESSION: No fracture or traumatic malalignment. Multilevel degenerative disc disease and lower lumbar facet degenerative changes. Calcified atherosclerotic change in the abdominal aorta. Electronically Signed   By: Dorise Bullion III M.D.   On: 07/16/2022 08:53   DG Thoracic Spine 2 View  Result Date: 07/16/2022 CLINICAL DATA:  Pain in lower back. EXAM: THORACIC SPINE 2 VIEWS COMPARISON:  CT the abdomen and pelvis May 29, 2022 FINDINGS: Mild concavity of the superior endplates of N27 and P82 is stable since May 29, 2022. No acute fracture. No malalignment. Multilevel degenerative disc disease. IMPRESSION: 1. No acute fracture or traumatic malalignment. Degenerative changes. Electronically Signed   By: Dorise Bullion III M.D.   On: 07/16/2022 08:52    Procedures Procedures   Medications Ordered in ED Medications  lidocaine  (LIDODERM) 5 % 1 patch (1 patch Transdermal Patch Applied 07/16/22 0800)  acetaminophen (TYLENOL) tablet 650 mg (650 mg Oral Given 07/16/22 0800)  oxyCODONE (Oxy IR/ROXICODONE) immediate release tablet 5 mg (5 mg Oral Given 07/16/22 0800)    ED Course/ Medical Decision Making/ A&P Clinical Course as of 07/16/22 4235  [facility] Jul 16, 2022  0910 Thoracic and lumbar spine x-rays reviewed and interpreted by me as showing no acute abnormality. [RP]    Clinical Course User Index [RP] Fransico Meadow, MD                           Medical Decision Making Amount and/or Complexity of Data Reviewed Radiology: ordered.  Risk OTC drugs. Prescription drug management.   Patrick Buchanan is a 71 y.o. male with history of brain tumor status postresection with left upper and left lower chronic paralysis who presents with chief complaint of back pain.  Initial Ddx:  Muscle strain, pathologic fracture, spinal cord compression  MDM:  Feel the patient likely had a muscle strain strain given his mechanism and location of pain.  Possible that he could have a pathologic fracture so we will obtain x-rays at this time.  No signs of spinal cord compression and also feel this is highly unlikely given the mechanism.  Plan:  Pain control X-ray of thoracic and lumbar spine  ED Summary:  Patient's x-rays did not show any evidence of fracture on my review or radiology read.  Patient was feeling much better after receiving lidocaine patch, oxycodone, and Tylenol for his pain.  Discussed pain control with the patient at his facility and instructed him to continue taking his Norco as prescribed and did give him a written prescription for lidocaine patches.  Return precautions discussed with the patient prior to discharge and I informed him that he will need to follow-up with his primary doctor in 2 to 3 days regarding his visit.  Dispo: DC to Facility   Records reviewed [employer] Documents I  independently visualized the following imaging with scope of interpretation limited to determining acute life threatening conditions  related to emergency care:  spine x-rays , which revealed no acute abnormality   Final Clinical Impression(s) / ED Diagnoses Final diagnoses:  Acute left-sided low back pain without sciatica    Rx / DC Orders ED Discharge Orders          Ordered    lidocaine (HM LIDOCAINE PATCH) 4 %  Every 24 hours        07/16/22 0936              Fransico Meadow, MD 07/16/22 [phone]

## 2022-07-16 NOTE — ED Triage Notes (Signed)
He reports sudden onset of "lower lumbar" back pain "and I heard a 'pop'" yesterday evening while repositioning himself. He tells me this is new. He also tells me he has chronic left-sided weakness "ever since I had brain surgery by Dr. Trenton Gammon". He denies any paresthesias.

## 2022-07-16 NOTE — ED Notes (Signed)
[facility] are here. Pt. Remains awake, alert and in no distress.

## 2022-07-16 NOTE — ED Notes (Signed)
Await [facility],

## 2022-07-16 NOTE — ED Notes (Signed)
[facility] has been called for transport; was told they couldn't give an ETA, but the call has been placed

## 2022-07-16 NOTE — Discharge Instructions (Signed)
Today you were seen in the emergency department for your back pain.    In the emergency department you had x-rays that did not show any broken bones.    At home, please use lidocaine patches we have prescribed you along with the Norco.  Please take the Norco as prescribed which is typically every 6 hours as needed for pain.  Please note that the frequency of this medication may be adjusted by your medical staff depending on how you tolerate the medication.  Check your MyChart online for the results of any tests that had not resulted by the time you left the emergency department.   Follow-up with your primary doctor in 2-3 days regarding your visit.    Return immediately to the emergency department if you experience any of the following: Worsening pain, numbness or weakness of your leg, or any other concerning symptoms.    Thank you for visiting our Emergency Department. It was a pleasure taking care of you today.

## 2022-07-18 DIAGNOSIS — F332 Major depressive disorder, recurrent severe without psychotic features: Secondary | ICD-10-CM | POA: Diagnosis not present

## 2022-07-19 ENCOUNTER — Emergency Department (HOSPITAL_COMMUNITY): Payer: Medicare HMO

## 2022-07-19 ENCOUNTER — Other Ambulatory Visit: Payer: Self-pay

## 2022-07-19 ENCOUNTER — Emergency Department (HOSPITAL_COMMUNITY)
Admission: EM | Admit: 2022-07-19 | Discharge: 2022-07-19 | Disposition: A | Discharge status: Skilled Nursing Facility | Payer: Medicare HMO | Attending: Emergency Medicine | Admitting: Emergency Medicine

## 2022-07-19 DIAGNOSIS — M48061 Spinal stenosis, lumbar region without neurogenic claudication: Secondary | ICD-10-CM | POA: Diagnosis not present

## 2022-07-19 DIAGNOSIS — R410 Disorientation, unspecified: Secondary | ICD-10-CM | POA: Diagnosis not present

## 2022-07-19 DIAGNOSIS — E119 Type 2 diabetes mellitus without complications: Secondary | ICD-10-CM | POA: Insufficient documentation

## 2022-07-19 DIAGNOSIS — M545 Low back pain, unspecified: Secondary | ICD-10-CM

## 2022-07-19 DIAGNOSIS — Z7984 Long term (current) use of oral hypoglycemic drugs: Secondary | ICD-10-CM | POA: Insufficient documentation

## 2022-07-19 DIAGNOSIS — Z7401 Bed confinement status: Secondary | ICD-10-CM | POA: Diagnosis not present

## 2022-07-19 DIAGNOSIS — M549 Dorsalgia, unspecified: Secondary | ICD-10-CM | POA: Diagnosis not present

## 2022-07-19 DIAGNOSIS — R531 Weakness: Secondary | ICD-10-CM | POA: Diagnosis not present

## 2022-07-19 LAB — COMPREHENSIVE METABOLIC PANEL
ALT: 12 U/L (ref 0–44)
AST: 13 U/L — ABNORMAL LOW (ref 15–41)
Albumin: 3 g/dL — ABNORMAL LOW (ref 3.5–5.0)
Alkaline Phosphatase: 68 U/L (ref 38–126)
Anion gap: 7 (ref 5–15)
BUN: 12 mg/dL (ref 8–23)
CO2: 30 mmol/L (ref 22–32)
Calcium: 8.8 mg/dL — ABNORMAL LOW (ref 8.9–10.3)
Chloride: 106 mmol/L (ref 98–111)
Creatinine, Ser: 0.68 mg/dL (ref 0.61–1.24)
GFR, Estimated: >60 mL/min (ref >60)
Glucose, Bld: 97 mg/dL (ref 70–99)
Potassium: 3.5 mmol/L (ref 3.5–5.1)
Sodium: 143 mmol/L (ref 135–145)
Total Bilirubin: 0.5 mg/dL (ref 0.3–1.2)
Total Protein: 5.8 g/dL — ABNORMAL LOW (ref 6.5–8.1)

## 2022-07-19 LAB — CBC WITH DIFFERENTIAL/PLATELET
Abs Immature Granulocytes: 0.01 10*3/uL (ref 0.00–0.07)
Basophils Absolute: 0 10*3/uL (ref 0.0–0.1)
Basophils Relative: 1 %
Eosinophils Absolute: 0.2 10*3/uL (ref 0.0–0.5)
Eosinophils Relative: 3 %
HCT: 36 % — ABNORMAL LOW (ref 39.0–52.0)
Hemoglobin: 11.3 g/dL — ABNORMAL LOW (ref 13.0–17.0)
Immature Granulocytes: 0 %
Lymphocytes Relative: 12 %
Lymphs Abs: 0.7 10*3/uL (ref 0.7–4.0)
MCH: 27.7 pg (ref 26.0–34.0)
MCHC: 31.4 g/dL (ref 30.0–36.0)
MCV: 88.2 fL (ref 80.0–100.0)
Monocytes Absolute: 0.3 10*3/uL (ref 0.1–1.0)
Monocytes Relative: 6 %
Neutro Abs: 4.4 10*3/uL (ref 1.7–7.7)
Neutrophils Relative %: 78 %
Platelets: 273 10*3/uL (ref 150–400)
RBC: 4.08 MIL/uL — ABNORMAL LOW (ref 4.22–5.81)
RDW: 14.8 % (ref 11.5–15.5)
WBC: 5.6 10*3/uL (ref 4.0–10.5)
nRBC: 0 % (ref 0.0–0.2)

## 2022-07-19 MED ORDER — HYDROCODONE-ACETAMINOPHEN 5-325 MG PO TABS
1.0000 | ORAL_TABLET | Freq: Once | ORAL | Status: AC
  Administered 2022-07-19: 1 via ORAL
  Filled 2022-07-19: qty 1

## 2022-07-19 NOTE — Discharge Instructions (Signed)
Please follow-up with your primary care provider.  You have spinal stenosis at a different level than where your pain is.  Return to the ER if you develop numbness or weakness that is new.

## 2022-07-19 NOTE — ED Triage Notes (Signed)
BIBA from [facility] for back pain x3 days. Denies radiation  Seen in ER on 9/24 for same.  Norco at facility with no relief Incont bowel/bladder at baseline

## 2022-07-19 NOTE — ED Provider Notes (Signed)
[facility] DEPT Provider Note   CSN: [id number] Arrival date & time: 07/19/22  0825     History {Add pertinent medical, surgical, social history, OB history to HPI:1} Chief Complaint  Patient presents with   Back Pain    Patrick Buchanan is a 71 y.o. male, hx of spinal meningiomas, diabetes, who presents to the ED 2/2 to severe low back pain for the last 3 days. Reports it's getting worse and Norco at [facility] facility is only helpful at night because it makes him sleep. He is bed bound and reports he is incontinent of urine, and occasionally incontinent of bowel (which is baseline for him). He denies any fevers, chills. Denies any increased weakness, hx of left sided paralysis.      Home Medications Prior to Admission medications   Medication Sig Start Date End Date Taking? Authorizing Provider  acetaminophen (TYLENOL) 500 MG tablet Take 1,000 mg by mouth 2 (two) times daily.    [provider]  ALPRAZolam Duanne Moron) 0.5 MG tablet Take 1 tablet (0.5 mg total) by mouth 2 (two) times daily as needed for anxiety. 05/13/22   Thurnell Lose, MD  ARIPiprazole (ABILIFY) 10 MG tablet Take 10 mg by mouth daily. 06/09/22   [provider]  baclofen (LIORESAL) 10 MG tablet Take 1 tablet (10 mg total) by mouth 3 (three) times daily. 04/24/22   Ronnell Freshwater, NP  bisacodyl (DULCOLAX) 10 MG suppository Place 1 suppository (10 mg total) rectally daily as needed for moderate constipation. 05/13/22   Thurnell Lose, MD  Cholecalciferol (VITAMIN D3 PO) Take 1 capsule by mouth daily.    [provider]  diclofenac Sodium (VOLTAREN) 1 % GEL Apply 2 g topically 3 (three) times daily. 03/30/22   Setzer, Ed[address], PA-C  escitalopram (LEXAPRO) 20 MG tablet Take 1 tablet (20 mg total) by mouth daily. 04/24/22   Ronnell Freshwater, NP  gabapentin (NEURONTIN) 100 MG capsule Take 1 capsule (100 mg total) by mouth 2 (two) times daily. 04/24/22   Ronnell Freshwater, NP  gabapentin (NEURONTIN) 300 MG capsule Take 300 mg by mouth daily. 05/16/22   [provider]  HYDROcodone-acetaminophen (NORCO/VICODIN) 5-325 MG tablet Take by mouth. Patient not taking: Reported on 06/17/2022 05/17/22   [provider]  ketoconazole (NIZORAL) 2 % cream Apply 1 Application topically daily. 06/14/22   [provider]  ketoconazole (NIZORAL) 2 % shampoo Apply 1 Application topically. Every 20 days 06/14/22   [provider]  lacosamide 100 MG TABS Take 1 tablet (100 mg total) by mouth 2 (two) times daily. 05/13/22   Thurnell Lose, MD  lamoTRIgine (LAMICTAL) 100 MG tablet Take 1 tablet (100 mg total) by mouth 2 (two) times daily. 04/24/22   Ronnell Freshwater, NP  lidocaine (HM LIDOCAINE PATCH) 4 % Place 1 patch onto the skin daily for 14 days. 07/16/22 07/30/22  Fransico Meadow, MD  linaclotide [facility]) 145 MCG CAPS capsule Take 1 capsule (145 mcg total) by mouth daily before breakfast. 04/24/22   Ronnell Freshwater, NP  magnesium gluconate (MAGONATE) 500 MG tablet Take 0.5 tablets (250 mg total) by mouth at bedtime. Patient taking differently: Take 250 mg by mouth every other day. 04/24/22   Ronnell Freshwater, NP  magnesium hydroxide (MILK OF MAGNESIA) 400 MG/5ML suspension Take 30 mLs by mouth daily as needed for mild constipation.    [provider]  Multiple Vitamin (MULTIVITAMIN) tablet Take 1 tablet by  mouth daily.    [provider]  pantoprazole (PROTONIX) 40 MG tablet Take 1 tablet (40 mg total) by mouth at bedtime. 04/24/22   Ronnell Freshwater, NP  polyvinyl alcohol (LIQUIFILM TEARS) 1.4 % ophthalmic solution Place 1 drop into both eyes as needed for dry eyes.    [provider]  QUEtiapine (SEROQUEL) 50 MG tablet Take 1 tablet (50 mg total) by mouth at bedtime. 05/13/22   Thurnell Lose, MD  rosuvastatin (CRESTOR) 20 MG tablet Take 1 tablet (20 mg total) by mouth daily. 04/24/22   Ronnell Freshwater, NP   senna-docusate (SENOKOT-S) 8.6-50 MG tablet Take 2 tablets by mouth at bedtime. 03/30/22   Setzer, Ed[address] Circle, PA-C  tamsulosin (FLOMAX) 0.4 MG CAPS capsule Take 1 capsule (0.4 mg total) by mouth daily after supper. 05/13/22   Thurnell Lose, MD  traZODone (DESYREL) 50 MG tablet Take 0.5-1 tablets (25-50 mg total) by mouth at bedtime as needed for sleep. 04/24/22   Ronnell Freshwater, NP  Vitamin D, Ergocalciferol, (DRISDOL) 1.25 MG (50000 UNIT) CAPS capsule Take 1 capsule (50,000 Units total) by mouth every Sunday. Take one tablet wkly 04/30/22   Ronnell Freshwater, NP      Allergies    Other    Review of Systems   Review of Systems  Constitutional:  Negative for chills and fever.  Musculoskeletal:  Positive for back pain.    Physical Exam Updated Vital Signs BP (!) 144/73 (BP Location: Right Arm)   Pulse 82   Temp 98 F (36.7 C) (Oral)   Resp 16   Ht '5\' 11"'$  (1.803 m)   Wt 84 kg   SpO2 100%   BMI 25.83 kg/m  Physical Exam Constitutional:      Appearance: Normal appearance.  HENT:     Head: Normocephalic and atraumatic.     Nose: Nose normal.     Mouth/Throat:     Mouth: Mucous membranes are moist.  Eyes:     Extraocular Movements: Extraocular movements intact.     Pupils: Pupils are equal, round, and reactive to light.  Cardiovascular:     Rate and Rhythm: Normal rate and regular rhythm.     Pulses: Normal pulses.     Heart sounds: Normal heart sounds.  Pulmonary:     Effort: Pulmonary effort is normal.     Breath sounds: Normal breath sounds.  Abdominal:     General: Abdomen is flat.     Palpations: Abdomen is soft.  Musculoskeletal:     Cervical back: Normal range of motion.     Comments: +L sided paralysis w/2/5 strength of LLE, 5/5 strength of RLE, TTP of midline L2/L3 area, no stepoffs noted.  Skin:    General: Skin is warm and dry.     Capillary Refill: Capillary refill takes less than 2 seconds.  Neurological:     Mental Status: He is alert.     Comments: No  loss of sensation  Psychiatric:        Mood and Affect: Mood normal.     ED Results / Procedures / Treatments   Labs (all labs ordered are listed, but only abnormal results are displayed) Labs Reviewed  CBC WITH DIFFERENTIAL/PLATELET - Abnormal; Notable for the following components:      Result Value   RBC 4.08 (*)    Hemoglobin 11.3 (*)    HCT 36.0 (*)    All other components within normal limits  COMPREHENSIVE METABOLIC PANEL -  Abnormal; Notable for the following components:   Calcium 8.8 (*)    Total Protein 5.8 (*)    Albumin 3.0 (*)    AST 13 (*)    All other components within normal limits    EKG None  Radiology CT Lumbar Spine Wo Contrast  Result Date: 07/19/2022 CLINICAL DATA:  Low back pain for 3 days. EXAM: CT LUMBAR SPINE WITHOUT CONTRAST TECHNIQUE: Multidetector CT imaging of the lumbar spine was performed without intravenous contrast administration. Multiplanar CT image reconstructions were also generated. RADIATION DOSE REDUCTION: This exam was performed according to the departmental dose-optimization program which includes automated exposure control, adjustment of the mA and/or kV according to patient size and/or use of iterative reconstruction technique. COMPARISON:  CT abdomen and pelvis and CT lumbar spine 05/29/2022 FINDINGS: Segmentation: 5 lumbar type vertebrae. Alignment: Mild lower lumbar levoscoliosis. No significant listhesis. Vertebrae: T12 superior endplate compression fracture with unchanged, mild vertebral body height loss, possibly incompletely healed. No acute fracture. Partial bilateral SI joint ankylosis. Paraspinal and other soft tissues: Cholelithiasis. Aortic atherosclerosis. Disc levels: Unchanged appearance of lumbar disc and facet degeneration since last month's CT, most notable at L4-5 where there is moderate to severe spinal stenosis and moderate bilateral neural foraminal stenosis. IMPRESSION: 1. No acute lumbar spine fracture. 2. Mild T12  compression fracture, unchanged from 05/29/2022 though potentially incompletely healed/subacute. If there is pain localizing to this region and the patient would be candidate for intervention, consider MRI for further evaluation. 3. Unchanged lumbar disc and facet degeneration, most notable at L4-5 where there is moderate to severe spinal stenosis and moderate bilateral neural foraminal stenosis. 4. Cholelithiasis. 5. Aortic Atherosclerosis (ICD10-I70.0). Electronically Signed   By: Logan Bores M.D.   On: 07/19/2022 11:31    Procedures Procedures   Medications Ordered in ED Medications  HYDROcodone-acetaminophen (NORCO/VICODIN) 5-325 MG per tablet 1 tablet (1 tablet Oral Given 07/19/22 0347)    ED Course/ Medical Decision Making/ A&P                           Medical Decision Making Amount and/or Complexity of Data Reviewed Labs: ordered. Radiology: ordered.  Risk Prescription drug management.   This patient presents to the ED for concern of low back pain  Co morbidities that complicate the patient evaluation  Hx of left sided paralysis, bowel and bladder incontinence   Lab Tests:  I Ordered, and personally interpreted labs.  The pertinent results include:  ***   Imaging Studies ordered:  I ordered imaging studies including ***  I independently visualized and interpreted imaging which showed *** I agree with the radiologist interpretation   Cardiac Monitoring: / EKG:  The patient was maintained on a cardiac monitor.  I personally viewed and interpreted the cardiac monitored which showed an underlying rhythm of: ***   Consultations Obtained:  I requested consultation with the ***,  and discussed lab and imaging findings as well as pertinent plan - they recommend: ***   Problem List / ED Course / Critical interventions / Medication management  *** I ordered medication including ***  for ***  Reevaluation of the patient after these medicines showed that the patient  {resolved/improved/worsened:23923::"improved"} I have reviewed the patients home medicines and have made adjustments as needed   Social Determinants of Health:  ***   Test / Admission - Considered:  ***   Final Clinical Impression(s) / ED Diagnoses Final diagnoses:  Acute midline low back pain without  sciatica  Spinal stenosis of lumbar region, unspecified whether neurogenic claudication present    Rx / DC Orders ED Discharge Orders     None

## 2022-07-21 DIAGNOSIS — F332 Major depressive disorder, recurrent severe without psychotic features: Secondary | ICD-10-CM | POA: Diagnosis not present

## 2022-07-22 ENCOUNTER — Emergency Department (HOSPITAL_BASED_OUTPATIENT_CLINIC_OR_DEPARTMENT_OTHER)
Admission: EM | Admit: 2022-07-22 | Discharge: 2022-07-23 | Disposition: A | Discharge status: Skilled Nursing Facility | Payer: Medicare HMO | Attending: Emergency Medicine | Admitting: Emergency Medicine

## 2022-07-22 ENCOUNTER — Encounter (HOSPITAL_BASED_OUTPATIENT_CLINIC_OR_DEPARTMENT_OTHER): Payer: Self-pay | Admitting: Emergency Medicine

## 2022-07-22 ENCOUNTER — Emergency Department (HOSPITAL_BASED_OUTPATIENT_CLINIC_OR_DEPARTMENT_OTHER): Payer: Medicare HMO

## 2022-07-22 DIAGNOSIS — R531 Weakness: Secondary | ICD-10-CM | POA: Insufficient documentation

## 2022-07-22 DIAGNOSIS — G8929 Other chronic pain: Secondary | ICD-10-CM | POA: Insufficient documentation

## 2022-07-22 DIAGNOSIS — M549 Dorsalgia, unspecified: Secondary | ICD-10-CM | POA: Diagnosis not present

## 2022-07-22 DIAGNOSIS — K5900 Constipation, unspecified: Secondary | ICD-10-CM

## 2022-07-22 DIAGNOSIS — I700 Atherosclerosis of aorta: Secondary | ICD-10-CM | POA: Diagnosis not present

## 2022-07-22 DIAGNOSIS — R109 Unspecified abdominal pain: Secondary | ICD-10-CM | POA: Insufficient documentation

## 2022-07-22 DIAGNOSIS — M545 Low back pain, unspecified: Secondary | ICD-10-CM | POA: Diagnosis not present

## 2022-07-22 LAB — COMPREHENSIVE METABOLIC PANEL
ALT: 9 U/L (ref 0–44)
AST: 13 U/L — ABNORMAL LOW (ref 15–41)
Albumin: 3.7 g/dL (ref 3.5–5.0)
Alkaline Phosphatase: 69 U/L (ref 38–126)
Anion gap: 10 (ref 5–15)
BUN: 13 mg/dL (ref 8–23)
CO2: 30 mmol/L (ref 22–32)
Calcium: 8.9 mg/dL (ref 8.9–10.3)
Chloride: 104 mmol/L (ref 98–111)
Creatinine, Ser: 0.74 mg/dL (ref 0.61–1.24)
GFR, Estimated: >60 mL/min (ref >60)
Glucose, Bld: 96 mg/dL (ref 70–99)
Potassium: 4.3 mmol/L (ref 3.5–5.1)
Sodium: 144 mmol/L (ref 135–145)
Total Bilirubin: 0.3 mg/dL (ref 0.3–1.2)
Total Protein: 5.8 g/dL — ABNORMAL LOW (ref 6.5–8.1)

## 2022-07-22 LAB — CBC WITH DIFFERENTIAL/PLATELET
Abs Immature Granulocytes: 0.01 10*3/uL (ref 0.00–0.07)
Basophils Absolute: 0 10*3/uL (ref 0.0–0.1)
Basophils Relative: 1 %
Eosinophils Absolute: 0.2 10*3/uL (ref 0.0–0.5)
Eosinophils Relative: 3 %
HCT: 37.5 % — ABNORMAL LOW (ref 39.0–52.0)
Hemoglobin: 11.7 g/dL — ABNORMAL LOW (ref 13.0–17.0)
Immature Granulocytes: 0 %
Lymphocytes Relative: 16 %
Lymphs Abs: 1 10*3/uL (ref 0.7–4.0)
MCH: 27.1 pg (ref 26.0–34.0)
MCHC: 31.2 g/dL (ref 30.0–36.0)
MCV: 86.8 fL (ref 80.0–100.0)
Monocytes Absolute: 0.6 10*3/uL (ref 0.1–1.0)
Monocytes Relative: 9 %
Neutro Abs: 4.4 10*3/uL (ref 1.7–7.7)
Neutrophils Relative %: 71 %
Platelets: 299 10*3/uL (ref 150–400)
RBC: 4.32 MIL/uL (ref 4.22–5.81)
RDW: 15 % (ref 11.5–15.5)
WBC: 6.2 10*3/uL (ref 4.0–10.5)
nRBC: 0 % (ref 0.0–0.2)

## 2022-07-22 LAB — OCCULT BLOOD X 1 CARD TO LAB, STOOL: Fecal Occult Bld: NEGATIVE

## 2022-07-22 LAB — LIPASE, BLOOD: Lipase: 24 U/L (ref 11–51)

## 2022-07-22 MED ORDER — POLYETHYLENE GLYCOL 3350 17 G PO PACK: 17.0000 g | PACK | Freq: Every day | ORAL | 0 refills | Status: DC

## 2022-07-22 MED ORDER — HYDROCORTISONE ACETATE 25 MG RE SUPP
25.0000 mg | Freq: Once | RECTAL | Status: AC
  Administered 2022-07-22: 25 mg via RECTAL
  Filled 2022-07-22: qty 1

## 2022-07-22 MED ORDER — FLEET ENEMA 7-19 GM/118ML RE ENEM
1.0000 | ENEMA | Freq: Once | RECTAL | Status: AC
  Administered 2022-07-22: 1 via RECTAL
  Filled 2022-07-22: qty 1

## 2022-07-22 MED ORDER — SODIUM CHLORIDE 0.9 % IV BOLUS
1000.0000 mL | Freq: Once | INTRAVENOUS | Status: AC
  Administered 2022-07-22: 1000 mL via INTRAVENOUS

## 2022-07-22 MED ORDER — IOHEXOL 300 MG/ML  SOLN
100.0000 mL | Freq: Once | INTRAMUSCULAR | Status: AC | PRN
  Administered 2022-07-22: 100 mL via INTRAVENOUS
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
## 2022-07-22 NOTE — ED Provider Notes (Signed)
[facility] EMERGENC[facility] DEPT Provider Note   CSN: [id number] Arrival date & time: 07/22/22  1746     History  Chief Complaint  Patient presents with   Constipation    Patrick Buchanan is a 71 y.o. male.  Patient presents with concern for constipation.  He is bedbound with left-sided weakness from previous cerebral aneurysm repair.  States he has not had a bowel movement for the past 4 to 5 days.  Does take Norco at his facility.  Was seen in the ED several days ago for back pain and found to have a chronic compression fracture.  States he is still passing gas but unable unable to have a bowel movement reports burning to his buttocks area.  No fever.  No vomiting.  Some diffuse crampy abdominal pain.  No chest pain or shortness of breath.  Normally moves his bowels every day but has not been getting MiraLAX on a regular basis at his facility. Denies any pain with urination or blood in the urine but does have a chronic incontinence at baseline.  His chronic left-sided weakness is unchanged.  States his main complaint is burning his abdomen and not able to have a bowel movement for the past several days.  No blood thinner use.  The history is provided by the patient and the EMS personnel.  Constipation Associated symptoms: back pain   Associated symptoms: no nausea and no vomiting        Home Medications Prior to Admission medications   Medication Sig Start Date End Date Taking? Authorizing Provider  acetaminophen (TYLENOL) 500 MG tablet Take 1,000 mg by mouth 2 (two) times daily.    [provider]  ALPRAZolam Duanne Moron) 0.5 MG tablet Take 1 tablet (0.5 mg total) by mouth 2 (two) times daily as needed for anxiety. 05/13/22   Thurnell Lose, MD  ARIPiprazole (ABILIFY) 10 MG tablet Take 10 mg by mouth daily. 06/09/22   [provider]  baclofen (LIORESAL) 10 MG tablet Take 1 tablet (10 mg total) by mouth 3 (three) times daily. 04/24/22   Ronnell Freshwater,  NP  bisacodyl (DULCOLAX) 10 MG suppository Place 1 suppository (10 mg total) rectally daily as needed for moderate constipation. 05/13/22   Thurnell Lose, MD  Cholecalciferol (VITAMIN D3 PO) Take 1 capsule by mouth daily.    [provider]  diclofenac Sodium (VOLTAREN) 1 % GEL Apply 2 g topically 3 (three) times daily. 03/30/22   Setzer, Edman Circle, PA-C  escitalopram (LEXAPRO) 20 MG tablet Take 1 tablet (20 mg total) by mouth daily. 04/24/22   Ronnell Freshwater, NP  gabapentin (NEURONTIN) 100 MG capsule Take 1 capsule (100 mg total) by mouth 2 (two) times daily. 04/24/22   Ronnell Freshwater, NP  gabapentin (NEURONTIN) 300 MG capsule Take 300 mg by mouth daily. 05/16/22   [provider]  HYDROcodone-acetaminophen (NORCO/VICODIN) 5-325 MG tablet Take by mouth. Patient not taking: Reported on 06/17/2022 05/17/22   [provider]  ketoconazole (NIZORAL) 2 % cream Apply 1 Application topically daily. 06/14/22   [provider]  ketoconazole (NIZORAL) 2 % shampoo Apply 1 Application topically. Every 20 days 06/14/22   [provider]  lacosamide 100 MG TABS Take 1 tablet (100 mg total) by mouth 2 (two) times daily. 05/13/22   Thurnell Lose, MD  lamoTRIgine (LAMICTAL) 100 MG tablet Take 1 tablet (100 mg total) by mouth 2 (two) times daily. 04/24/22   Ronnell Freshwater, NP  lidocaine (HM LIDOCAINE PATCH) 4 % Place 1 patch onto the skin daily for 14 days. 07/16/22 07/30/22  Fransico Meadow, MD  linaclotide [facility]) 145 MCG CAPS capsule Take 1 capsule (145 mcg total) by mouth daily before breakfast. 04/24/22   Ronnell Freshwater, NP  magnesium gluconate (MAGONATE) 500 MG tablet Take 0.5 tablets (250 mg total) by mouth at bedtime. Patient taking differently: Take 250 mg by mouth every other day. 04/24/22   Ronnell Freshwater, NP  magnesium hydroxide (MILK OF MAGNESIA) 400 MG/5ML suspension Take 30 mLs by mouth daily as needed for mild constipation.    [provider]  Multiple Vitamin (MULTIVITAMIN) tablet Take 1 tablet by mouth daily.    [provider]  pantoprazole (PROTONIX) 40 MG tablet Take 1 tablet (40 mg total) by mouth at bedtime. 04/24/22   Ronnell Freshwater, NP  polyvinyl alcohol (LIQUIFILM TEARS) 1.4 % ophthalmic solution Place 1 drop into both eyes as needed for dry eyes.    [provider]  QUEtiapine (SEROQUEL) 50 MG tablet Take 1 tablet (50 mg total) by mouth at bedtime. 05/13/22   Thurnell Lose, MD  rosuvastatin (CRESTOR) 20 MG tablet Take 1 tablet (20 mg total) by mouth daily. 04/24/22   Ronnell Freshwater, NP  senna-docusate (SENOKOT-S) 8.6-50 MG tablet Take 2 tablets by mouth at bedtime. 03/30/22   Setzer, Edman Circle, PA-C  tamsulosin (FLOMAX) 0.4 MG CAPS capsule Take 1 capsule (0.4 mg total) by mouth daily after supper. 05/13/22   Thurnell Lose, MD  traZODone (DESYREL) 50 MG tablet Take 0.5-1 tablets (25-50 mg total) by mouth at bedtime as needed for sleep. 04/24/22   Ronnell Freshwater, NP  Vitamin D, Ergocalciferol, (DRISDOL) 1.25 MG (50000 UNIT) CAPS capsule Take 1 capsule (50,000 Units total) by mouth every Sunday. Take one tablet wkly 04/30/22   Ronnell Freshwater, NP      Allergies    Other    Review of Systems   Review of Systems  Gastrointestinal:  Positive for constipation. Negative for nausea and vomiting.  Musculoskeletal:  Positive for back pain. Negative for arthralgias.   all other systems are negative except as noted in the HPI and PMH.    Physical Exam Updated Vital Signs BP 110/68 (BP Location: Right Arm)   Pulse 66   Temp 98.3 F (36.8 C) (Oral)   Resp 16   SpO2 99%  Physical Exam Vitals and nursing note reviewed.  Constitutional:      General: He is not in acute distress.    Appearance: He is well-developed.  HENT:     Head: Normocephalic and atraumatic.     Mouth/Throat:     Pharynx: No oropharyngeal exudate.  Eyes:     Conjunctiva/sclera: Conjunctivae normal.     Pupils: Pupils are  equal, round, and reactive to light.  Neck:     Comments: No meningismus. Cardiovascular:     Rate and Rhythm: Normal rate and regular rhythm.     Heart sounds: Normal heart sounds. No murmur heard. Pulmonary:     Effort: Pulmonary effort is normal. No respiratory distress.     Breath sounds: Normal breath sounds.  Abdominal:     Palpations: Abdomen is soft.     Tenderness: There is abdominal tenderness. There is no guarding or rebound.  Genitourinary:    Comments: Firm stool just past fingertip.  No gross blood. Musculoskeletal:        General: No tenderness. Normal range  of motion.     Cervical back: Normal range of motion and neck supple.  Skin:    General: Skin is warm.  Neurological:     Mental Status: He is alert.     Cranial Nerves: No cranial nerve deficit.     Motor: No abnormal muscle tone.     Coordination: Coordination normal.     Comments: Strength of left arm and left leg at baseline.  5/5 strength in the right  Psychiatric:        Behavior: Behavior normal.     ED Results / Procedures / Treatments   Labs (all labs ordered are listed, but only abnormal results are displayed) Labs Reviewed  COMPREHENSIVE METABOLIC PANEL - Abnormal; Notable for the following components:      Result Value   Total Protein 5.8 (*)    AST 13 (*)    All other components within normal limits  CBC WITH DIFFERENTIAL/PLATELET - Abnormal; Notable for the following components:   Hemoglobin 11.7 (*)    HCT 37.5 (*)    All other components within normal limits  LIPASE, BLOOD  OCCULT BLOOD X 1 CARD TO LAB, STOOL    EKG None  Radiology CT ABDOMEN PELVIS W CONTRAST  Result Date: 07/22/2022 CLINICAL DATA:  Acute abdominal pain and constipation for 1 day, initial encounter EXAM: CT ABDOMEN AND PELVIS WITH CONTRAST TECHNIQUE: Multidetector CT imaging of the abdomen and pelvis was performed using the standard protocol following bolus administration of intravenous contrast. RADIATION DOSE  REDUCTION: This exam was performed according to the departmental dose-optimization program which includes automated exposure control, adjustment of the mA and/or kV according to patient size and/or use of iterative reconstruction technique. CONTRAST:  176m OMNIPAQUE IOHEXOL 300 MG/ML  SOLN COMPARISON:  05/29/2022 FINDINGS: Lower chest: Small right-sided pleural effusion is noted. Hepatobiliary: Multiple gallstones are noted within a well distended gallbladder. No biliary ductal dilatation is seen. The liver is unremarkable. Pancreas: Unremarkable. No pancreatic ductal dilatation or surrounding inflammatory changes. Spleen: Normal in size without focal abnormality. Adrenals/Urinary Tract: Adrenal glands are within normal limits. Kidneys show no renal calculi or obstructive changes. Ureters are within normal limits. The bladder is partially distended. Previously seen calculi are no longer identified. Stomach/Bowel: No obstructive or inflammatory changes of the colon are noted. Some retained fecal material is noted distally without definitive colitis. This corresponds with the patient's given clinical history. The small bowel and stomach are unremarkable. The appendix is not visualized consistent with prior surgical history. Vascular/Lymphatic: Aortic atherosclerosis. No enlarged abdominal or pelvic lymph nodes. Reproductive: Prostate is unremarkable. Other: No abdominal wall hernia or abnormality. No abdominopelvic ascites. Musculoskeletal: Degenerative changes of lumbar spine are noted. Stable T12 compression fracture is noted. IMPRESSION: Changes of colonic constipation distally without definitive colitis. Cholelithiasis without complicating factors. Small right-sided pleural effusion. Electronically Signed   By: MInez CatalinaM.D.   On: 07/22/2022 20:58    Procedures Procedures    Medications Ordered in ED Medications  sodium phosphate (FLEET) 7-19 GM/118ML enema 1 enema (has no administration in time  range)  sodium chloride 0.9 % bolus 1,000 mL (has no administration in time range)  hydrocortisone (ANUSOL-HC) suppository 25 mg (has no administration in time range)    ED Course/ Medical Decision Making/ A&P                           Medical Decision Making Amount and/or Complexity of Data Reviewed Labs: ordered.  Decision-making details documented in ED Course. Radiology: ordered and independent interpretation performed. Decision-making details documented in ED Course. ECG/medicine tests: ordered and independent interpretation performed. Decision-making details documented in ED Course.  Risk OTC drugs. Prescription drug management.  Constipation with history of same.  Diffuse abdominal tenderness.  No vomiting.  No obvious fecal impaction on exam.  Abdomen soft but diffusely tender.  No guarding or rebound.  Patient given IV fluids.  Labs are reassuring with normal LFTs and lipase.  Creatinine at baseline. He was able to have a bowel movement after giving enema in the ED.  CT scan was obtained shows no bowel obstruction or other acute surgical pathology.  Results reviewed and interpreted by me.  We will increase his bowel regimen at his facility by adding MiraLAX as well as using stool softener such as Colace.  Increase fiber in the diet as well as oral hydration. His left-sided weakness is unchanged.  Low suspicion for acute spinal pathology tonight.  Follow up with his primary doctor.  Return precautions discussed.       Final Clinical Impression(s) / ED Diagnoses Final diagnoses:  Constipation, unspecified constipation type    Rx / DC Orders ED Discharge Orders     None         Livianna Petraglia, Annie Main, MD 07/22/22 2315

## 2022-07-22 NOTE — ED Notes (Signed)
Pt advised he is incontinent of bladder unable to get UA clean catch.

## 2022-07-22 NOTE — ED Triage Notes (Signed)
Arrived via EMS from [place] c/o constipation x 1 day. Had a suppository today. Had a normal BM yesterday per the facility. Also c/o back pain.

## 2022-07-22 NOTE — ED Notes (Signed)
Pt refuse to do urine sample

## 2022-07-22 NOTE — Discharge Instructions (Signed)
Your testing today is reassuring.  Take the medication for constipation as prescribed.  Follow-up with your doctor.  You may also use milk of magnesia if you would like.  Return to the ED with worsening abdominal pain, vomiting, any other concerns

## 2022-07-23 DIAGNOSIS — R531 Weakness: Secondary | ICD-10-CM | POA: Diagnosis not present

## 2022-07-23 DIAGNOSIS — Z7401 Bed confinement status: Secondary | ICD-10-CM | POA: Diagnosis not present

## 2022-07-23 NOTE — ED Notes (Signed)
Attempted to contact [facility] via phone. Had no answer at facility to leave report on Pt.

## 2022-07-24 DIAGNOSIS — E119 Type 2 diabetes mellitus without complications: Secondary | ICD-10-CM | POA: Diagnosis not present

## 2022-07-24 DIAGNOSIS — F3341 Major depressive disorder, recurrent, in partial remission: Secondary | ICD-10-CM | POA: Diagnosis not present

## 2022-07-24 DIAGNOSIS — G8194 Hemiplegia, unspecified affecting left nondominant side: Secondary | ICD-10-CM | POA: Diagnosis not present

## 2022-07-24 DIAGNOSIS — D329 Benign neoplasm of meninges, unspecified: Secondary | ICD-10-CM | POA: Diagnosis not present

## 2022-07-24 DIAGNOSIS — G40909 Epilepsy, unspecified, not intractable, without status epilepticus: Secondary | ICD-10-CM | POA: Diagnosis not present

## 2022-07-24 DIAGNOSIS — F411 Generalized anxiety disorder: Secondary | ICD-10-CM | POA: Diagnosis not present

## 2022-07-24 DIAGNOSIS — K5909 Other constipation: Secondary | ICD-10-CM | POA: Diagnosis not present

## 2022-07-24 DIAGNOSIS — G8110 Spastic hemiplegia affecting unspecified side: Secondary | ICD-10-CM | POA: Diagnosis not present

## 2022-07-25 ENCOUNTER — Inpatient Hospital Stay: Payer: Medicare HMO | Admitting: Physician Assistant

## 2022-07-26 DIAGNOSIS — Z23 Encounter for immunization: Secondary | ICD-10-CM | POA: Diagnosis not present

## 2022-07-26 DIAGNOSIS — G936 Cerebral edema: Secondary | ICD-10-CM | POA: Diagnosis not present

## 2022-07-26 DIAGNOSIS — R2689 Other abnormalities of gait and mobility: Secondary | ICD-10-CM | POA: Diagnosis not present

## 2022-07-26 DIAGNOSIS — F332 Major depressive disorder, recurrent severe without psychotic features: Secondary | ICD-10-CM | POA: Diagnosis not present

## 2022-07-26 DIAGNOSIS — G9341 Metabolic encephalopathy: Secondary | ICD-10-CM | POA: Diagnosis not present

## 2022-07-26 DIAGNOSIS — R1310 Dysphagia, unspecified: Secondary | ICD-10-CM | POA: Diagnosis not present

## 2022-07-28 ENCOUNTER — Telehealth: Payer: Self-pay | Admitting: *Deleted

## 2022-07-28 NOTE — Telephone Encounter (Signed)
     Patient  visit on 07/23/2022  at [facility]  was for constipation   Have you been able to follow up with your primary care physician? Patient in [facility] will be seen by resident The patient was or was not able to obtain any needed medicine or equipment.  Are there diet recommendations that you are having difficulty following?  Patient expresses understanding of discharge instructions and education provided has no other needs at this time.    [facility] [phone] [address] , [place] [address] Email : [email or web address]

## 2022-08-01 ENCOUNTER — Inpatient Hospital Stay: Payer: Medicare HMO | Admitting: Physician Assistant

## 2022-08-03 DIAGNOSIS — F332 Major depressive disorder, recurrent severe without psychotic features: Secondary | ICD-10-CM | POA: Diagnosis not present

## 2022-08-09 DIAGNOSIS — E118 Type 2 diabetes mellitus with unspecified complications: Secondary | ICD-10-CM | POA: Diagnosis not present

## 2022-08-09 DIAGNOSIS — F639 Impulse disorder, unspecified: Secondary | ICD-10-CM | POA: Diagnosis not present

## 2022-08-09 DIAGNOSIS — G40909 Epilepsy, unspecified, not intractable, without status epilepticus: Secondary | ICD-10-CM | POA: Diagnosis not present

## 2022-08-09 DIAGNOSIS — K5901 Slow transit constipation: Secondary | ICD-10-CM | POA: Diagnosis not present

## 2022-08-09 DIAGNOSIS — M7918 Myalgia, other site: Secondary | ICD-10-CM | POA: Diagnosis not present

## 2022-08-09 DIAGNOSIS — F32A Depression, unspecified: Secondary | ICD-10-CM | POA: Diagnosis not present

## 2022-08-09 DIAGNOSIS — G8114 Spastic hemiplegia affecting left nondominant side: Secondary | ICD-10-CM | POA: Diagnosis not present

## 2022-08-09 DIAGNOSIS — F332 Major depressive disorder, recurrent severe without psychotic features: Secondary | ICD-10-CM | POA: Diagnosis not present

## 2022-08-09 DIAGNOSIS — G8194 Hemiplegia, unspecified affecting left nondominant side: Secondary | ICD-10-CM | POA: Diagnosis not present

## 2022-08-09 DIAGNOSIS — D329 Benign neoplasm of meninges, unspecified: Secondary | ICD-10-CM | POA: Diagnosis not present

## 2022-08-10 DIAGNOSIS — G9341 Metabolic encephalopathy: Secondary | ICD-10-CM | POA: Diagnosis not present

## 2022-08-10 DIAGNOSIS — G936 Cerebral edema: Secondary | ICD-10-CM | POA: Diagnosis not present

## 2022-08-10 DIAGNOSIS — R1310 Dysphagia, unspecified: Secondary | ICD-10-CM | POA: Diagnosis not present

## 2022-08-10 DIAGNOSIS — Z23 Encounter for immunization: Secondary | ICD-10-CM | POA: Diagnosis not present

## 2022-08-10 DIAGNOSIS — R2689 Other abnormalities of gait and mobility: Secondary | ICD-10-CM | POA: Diagnosis not present

## 2022-08-11 DIAGNOSIS — R1310 Dysphagia, unspecified: Secondary | ICD-10-CM | POA: Diagnosis not present

## 2022-08-11 DIAGNOSIS — R2689 Other abnormalities of gait and mobility: Secondary | ICD-10-CM | POA: Diagnosis not present

## 2022-08-11 DIAGNOSIS — G9341 Metabolic encephalopathy: Secondary | ICD-10-CM | POA: Diagnosis not present

## 2022-08-11 DIAGNOSIS — G936 Cerebral edema: Secondary | ICD-10-CM | POA: Diagnosis not present

## 2022-08-11 DIAGNOSIS — Z23 Encounter for immunization: Secondary | ICD-10-CM | POA: Diagnosis not present

## 2022-08-13 DIAGNOSIS — G936 Cerebral edema: Secondary | ICD-10-CM | POA: Diagnosis not present

## 2022-08-13 DIAGNOSIS — G9341 Metabolic encephalopathy: Secondary | ICD-10-CM | POA: Diagnosis not present

## 2022-08-13 DIAGNOSIS — Z23 Encounter for immunization: Secondary | ICD-10-CM | POA: Diagnosis not present

## 2022-08-13 DIAGNOSIS — R1310 Dysphagia, unspecified: Secondary | ICD-10-CM | POA: Diagnosis not present

## 2022-08-13 DIAGNOSIS — R2689 Other abnormalities of gait and mobility: Secondary | ICD-10-CM | POA: Diagnosis not present

## 2022-08-14 DIAGNOSIS — G9341 Metabolic encephalopathy: Secondary | ICD-10-CM | POA: Diagnosis not present

## 2022-08-14 DIAGNOSIS — G936 Cerebral edema: Secondary | ICD-10-CM | POA: Diagnosis not present

## 2022-08-14 DIAGNOSIS — R2689 Other abnormalities of gait and mobility: Secondary | ICD-10-CM | POA: Diagnosis not present

## 2022-08-14 DIAGNOSIS — R1310 Dysphagia, unspecified: Secondary | ICD-10-CM | POA: Diagnosis not present

## 2022-08-14 DIAGNOSIS — Z23 Encounter for immunization: Secondary | ICD-10-CM | POA: Diagnosis not present

## 2022-08-15 DIAGNOSIS — R2689 Other abnormalities of gait and mobility: Secondary | ICD-10-CM | POA: Diagnosis not present

## 2022-08-15 DIAGNOSIS — G936 Cerebral edema: Secondary | ICD-10-CM | POA: Diagnosis not present

## 2022-08-15 DIAGNOSIS — R1310 Dysphagia, unspecified: Secondary | ICD-10-CM | POA: Diagnosis not present

## 2022-08-15 DIAGNOSIS — G9341 Metabolic encephalopathy: Secondary | ICD-10-CM | POA: Diagnosis not present

## 2022-08-15 DIAGNOSIS — Z23 Encounter for immunization: Secondary | ICD-10-CM | POA: Diagnosis not present

## 2022-08-16 DIAGNOSIS — F332 Major depressive disorder, recurrent severe without psychotic features: Secondary | ICD-10-CM | POA: Diagnosis not present

## 2022-08-17 DIAGNOSIS — Z23 Encounter for immunization: Secondary | ICD-10-CM | POA: Diagnosis not present

## 2022-08-17 DIAGNOSIS — G9341 Metabolic encephalopathy: Secondary | ICD-10-CM | POA: Diagnosis not present

## 2022-08-17 DIAGNOSIS — R1310 Dysphagia, unspecified: Secondary | ICD-10-CM | POA: Diagnosis not present

## 2022-08-17 DIAGNOSIS — G936 Cerebral edema: Secondary | ICD-10-CM | POA: Diagnosis not present

## 2022-08-17 DIAGNOSIS — F332 Major depressive disorder, recurrent severe without psychotic features: Secondary | ICD-10-CM | POA: Diagnosis not present

## 2022-08-17 DIAGNOSIS — R2689 Other abnormalities of gait and mobility: Secondary | ICD-10-CM | POA: Diagnosis not present

## 2022-08-18 DIAGNOSIS — R1310 Dysphagia, unspecified: Secondary | ICD-10-CM | POA: Diagnosis not present

## 2022-08-18 DIAGNOSIS — R2689 Other abnormalities of gait and mobility: Secondary | ICD-10-CM | POA: Diagnosis not present

## 2022-08-18 DIAGNOSIS — Z23 Encounter for immunization: Secondary | ICD-10-CM | POA: Diagnosis not present

## 2022-08-18 DIAGNOSIS — G9341 Metabolic encephalopathy: Secondary | ICD-10-CM | POA: Diagnosis not present

## 2022-08-18 DIAGNOSIS — G936 Cerebral edema: Secondary | ICD-10-CM | POA: Diagnosis not present

## 2022-08-19 DIAGNOSIS — G936 Cerebral edema: Secondary | ICD-10-CM | POA: Diagnosis not present

## 2022-08-19 DIAGNOSIS — R2689 Other abnormalities of gait and mobility: Secondary | ICD-10-CM | POA: Diagnosis not present

## 2022-08-19 DIAGNOSIS — Z23 Encounter for immunization: Secondary | ICD-10-CM | POA: Diagnosis not present

## 2022-08-19 DIAGNOSIS — G9341 Metabolic encephalopathy: Secondary | ICD-10-CM | POA: Diagnosis not present

## 2022-08-19 DIAGNOSIS — R1310 Dysphagia, unspecified: Secondary | ICD-10-CM | POA: Diagnosis not present

## 2022-08-21 DIAGNOSIS — G936 Cerebral edema: Secondary | ICD-10-CM | POA: Diagnosis not present

## 2022-08-21 DIAGNOSIS — G9341 Metabolic encephalopathy: Secondary | ICD-10-CM | POA: Diagnosis not present

## 2022-08-21 DIAGNOSIS — Z23 Encounter for immunization: Secondary | ICD-10-CM | POA: Diagnosis not present

## 2022-08-21 DIAGNOSIS — R1310 Dysphagia, unspecified: Secondary | ICD-10-CM | POA: Diagnosis not present

## 2022-08-21 DIAGNOSIS — R2689 Other abnormalities of gait and mobility: Secondary | ICD-10-CM | POA: Diagnosis not present

## 2022-08-21 DIAGNOSIS — F332 Major depressive disorder, recurrent severe without psychotic features: Secondary | ICD-10-CM | POA: Diagnosis not present

## 2022-08-22 DIAGNOSIS — G936 Cerebral edema: Secondary | ICD-10-CM | POA: Diagnosis not present

## 2022-08-22 DIAGNOSIS — G9341 Metabolic encephalopathy: Secondary | ICD-10-CM | POA: Diagnosis not present

## 2022-08-22 DIAGNOSIS — R1310 Dysphagia, unspecified: Secondary | ICD-10-CM | POA: Diagnosis not present

## 2022-08-22 DIAGNOSIS — Z23 Encounter for immunization: Secondary | ICD-10-CM | POA: Diagnosis not present

## 2022-08-22 DIAGNOSIS — R2689 Other abnormalities of gait and mobility: Secondary | ICD-10-CM | POA: Diagnosis not present

## 2022-08-23 DIAGNOSIS — G936 Cerebral edema: Secondary | ICD-10-CM | POA: Diagnosis not present

## 2022-08-23 DIAGNOSIS — R2689 Other abnormalities of gait and mobility: Secondary | ICD-10-CM | POA: Diagnosis not present

## 2022-08-24 DIAGNOSIS — G936 Cerebral edema: Secondary | ICD-10-CM | POA: Diagnosis not present

## 2022-08-24 DIAGNOSIS — R2689 Other abnormalities of gait and mobility: Secondary | ICD-10-CM | POA: Diagnosis not present

## 2022-08-25 DIAGNOSIS — R2689 Other abnormalities of gait and mobility: Secondary | ICD-10-CM | POA: Diagnosis not present

## 2022-08-25 DIAGNOSIS — G936 Cerebral edema: Secondary | ICD-10-CM | POA: Diagnosis not present

## 2022-08-28 DIAGNOSIS — R2689 Other abnormalities of gait and mobility: Secondary | ICD-10-CM | POA: Diagnosis not present

## 2022-08-28 DIAGNOSIS — G936 Cerebral edema: Secondary | ICD-10-CM | POA: Diagnosis not present

## 2022-08-28 DIAGNOSIS — F332 Major depressive disorder, recurrent severe without psychotic features: Secondary | ICD-10-CM | POA: Diagnosis not present

## 2022-08-29 DIAGNOSIS — G936 Cerebral edema: Secondary | ICD-10-CM | POA: Diagnosis not present

## 2022-08-29 DIAGNOSIS — R2689 Other abnormalities of gait and mobility: Secondary | ICD-10-CM | POA: Diagnosis not present

## 2022-08-30 DIAGNOSIS — R2689 Other abnormalities of gait and mobility: Secondary | ICD-10-CM | POA: Diagnosis not present

## 2022-08-30 DIAGNOSIS — G936 Cerebral edema: Secondary | ICD-10-CM | POA: Diagnosis not present

## 2022-08-31 DIAGNOSIS — R2689 Other abnormalities of gait and mobility: Secondary | ICD-10-CM | POA: Diagnosis not present

## 2022-08-31 DIAGNOSIS — G936 Cerebral edema: Secondary | ICD-10-CM | POA: Diagnosis not present

## 2022-08-31 DIAGNOSIS — F332 Major depressive disorder, recurrent severe without psychotic features: Secondary | ICD-10-CM | POA: Diagnosis not present

## 2022-09-01 ENCOUNTER — Emergency Department (HOSPITAL_COMMUNITY)
Admission: EM | Admit: 2022-09-01 | Discharge: 2022-09-02 | Disposition: A | Discharge status: Skilled Nursing Facility | Payer: Medicare HMO | Attending: Internal Medicine | Admitting: Internal Medicine

## 2022-09-01 ENCOUNTER — Emergency Department (HOSPITAL_COMMUNITY): Payer: Medicare HMO

## 2022-09-01 ENCOUNTER — Encounter (HOSPITAL_COMMUNITY): Payer: Self-pay | Admitting: Emergency Medicine

## 2022-09-01 ENCOUNTER — Other Ambulatory Visit: Payer: Self-pay

## 2022-09-01 DIAGNOSIS — K5909 Other constipation: Secondary | ICD-10-CM | POA: Diagnosis not present

## 2022-09-01 DIAGNOSIS — I10 Essential (primary) hypertension: Secondary | ICD-10-CM | POA: Diagnosis not present

## 2022-09-01 DIAGNOSIS — R1084 Generalized abdominal pain: Secondary | ICD-10-CM | POA: Diagnosis not present

## 2022-09-01 DIAGNOSIS — K5900 Constipation, unspecified: Secondary | ICD-10-CM | POA: Diagnosis not present

## 2022-09-01 DIAGNOSIS — E119 Type 2 diabetes mellitus without complications: Secondary | ICD-10-CM | POA: Diagnosis not present

## 2022-09-01 DIAGNOSIS — T68XXXA Hypothermia, initial encounter: Secondary | ICD-10-CM | POA: Diagnosis not present

## 2022-09-01 DIAGNOSIS — R0689 Other abnormalities of breathing: Secondary | ICD-10-CM | POA: Diagnosis not present

## 2022-09-01 DIAGNOSIS — Z85841 Personal history of malignant neoplasm of brain: Secondary | ICD-10-CM | POA: Diagnosis not present

## 2022-09-01 DIAGNOSIS — R300 Dysuria: Secondary | ICD-10-CM | POA: Insufficient documentation

## 2022-09-01 DIAGNOSIS — K6289 Other specified diseases of anus and rectum: Secondary | ICD-10-CM | POA: Diagnosis not present

## 2022-09-01 DIAGNOSIS — K6389 Other specified diseases of intestine: Secondary | ICD-10-CM | POA: Diagnosis not present

## 2022-09-01 DIAGNOSIS — R1030 Lower abdominal pain, unspecified: Secondary | ICD-10-CM | POA: Diagnosis not present

## 2022-09-01 DIAGNOSIS — R58 Hemorrhage, not elsewhere classified: Secondary | ICD-10-CM | POA: Diagnosis not present

## 2022-09-01 DIAGNOSIS — L219 Seborrheic dermatitis, unspecified: Secondary | ICD-10-CM | POA: Diagnosis not present

## 2022-09-01 NOTE — ED Triage Notes (Addendum)
Pt BIB [facility] from [facility] for rectal pain and pain with urination since last night; v/s en route: 128/76, P 66, O2 94%, CBG 159, pt paralyzed on left side at baseline

## 2022-09-01 NOTE — ED Provider Notes (Signed)
/ [facility]PT Provider Note   CSN: [id number] Arrival date & time: 09/01/22  2232     History {Add pertinent medical, surgical, social history, OB history to HPI:1} Chief Complaint  Patient presents with   Rectal Pain    CHICO CAWOOD is a 71 y.o. male.  The history is provided by the patient and medical records.   71 year old male with history of hypertension, brain tumor, diabetes, history of stroke cerebral aneurysm rupture with resultant left sided hemiparesis, presenting to the ED for rectal pain and dysuria.  States he has been battling with constipation since last hospital visit 07/22/22 (CT at that time without other complicating features).  He states stool is hard, firm, and difficult to pass causing him to have a lot of rectal pain.  He also reports dysuria over the past 24-46 hours.  Feels like he is urinating out razor blades.  He denies fever, chills, sweats.  No changes in mental status, denies confusion.  Home Medications Prior to Admission medications   Medication Sig Start Date End Date Taking? Authorizing Provider  acetaminophen (TYLENOL) 500 MG tablet Take 1,000 mg by mouth 2 (two) times daily.    [provider]  ALPRAZolam Duanne Moron) 0.5 MG tablet Take 1 tablet (0.5 mg total) by mouth 2 (two) times daily as needed for anxiety. 05/13/22   Thurnell Lose, MD  ARIPiprazole (ABILIFY) 10 MG tablet Take 10 mg by mouth daily. 06/09/22   [provider]  baclofen (LIORESAL) 10 MG tablet Take 1 tablet (10 mg total) by mouth 3 (three) times daily. 04/24/22   Ronnell Freshwater, NP  bisacodyl (DULCOLAX) 10 MG suppository Place 1 suppository (10 mg total) rectally daily as needed for moderate constipation. 05/13/22   Thurnell Lose, MD  Cholecalciferol (VITAMIN D3 PO) Take 1 capsule by mouth daily.    [provider]  diclofenac Sodium (VOLTAREN) 1 % GEL Apply 2 g topically 3 (three) times daily. 03/30/22   Setzer,  Edman Circle, PA-C  escitalopram (LEXAPRO) 20 MG tablet Take 1 tablet (20 mg total) by mouth daily. 04/24/22   Ronnell Freshwater, NP  gabapentin (NEURONTIN) 100 MG capsule Take 1 capsule (100 mg total) by mouth 2 (two) times daily. 04/24/22   Ronnell Freshwater, NP  gabapentin (NEURONTIN) 300 MG capsule Take 300 mg by mouth daily. 05/16/22   [provider]  HYDROcodone-acetaminophen (NORCO/VICODIN) 5-325 MG tablet Take by mouth. Patient not taking: Reported on 06/17/2022 05/17/22   [provider]  ketoconazole (NIZORAL) 2 % cream Apply 1 Application topically daily. 06/14/22   [provider]  ketoconazole (NIZORAL) 2 % shampoo Apply 1 Application topically. Every 20 days 06/14/22   [provider]  lacosamide 100 MG TABS Take 1 tablet (100 mg total) by mouth 2 (two) times daily. 05/13/22   Thurnell Lose, MD  lamoTRIgine (LAMICTAL) 100 MG tablet Take 1 tablet (100 mg total) by mouth 2 (two) times daily. 04/24/22   Ronnell Freshwater, NP  linaclotide (LINZESS) 145 MCG CAPS capsule Take 1 capsule (145 mcg total) by mouth daily before breakfast. 04/24/22   Ronnell Freshwater, NP  magnesium gluconate (MAGONATE) 500 MG tablet Take 0.5 tablets (250 mg total) by mouth at bedtime. Patient taking differently: Take 250 mg by mouth every other day. 04/24/22   Ronnell Freshwater, NP  magnesium hydroxide (MILK OF MAGNESIA) 400 MG/5ML suspension Take 30 mLs by mouth daily as needed for mild constipation.  [provider]  Multiple Vitamin (MULTIVITAMIN) tablet Take 1 tablet by mouth daily.    [provider]  pantoprazole (PROTONIX) 40 MG tablet Take 1 tablet (40 mg total) by mouth at bedtime. 04/24/22   Ronnell Freshwater, NP  polyethylene glycol (MIRALAX) 17 g packet Take 17 g by mouth daily. 07/22/22   Rancour, Annie Main, MD  polyvinyl alcohol (LIQUIFILM TEARS) 1.4 % ophthalmic solution Place 1 drop into both eyes as needed for dry eyes.    [provider]  QUEtiapine  (SEROQUEL) 50 MG tablet Take 1 tablet (50 mg total) by mouth at bedtime. 05/13/22   Thurnell Lose, MD  rosuvastatin (CRESTOR) 20 MG tablet Take 1 tablet (20 mg total) by mouth daily. 04/24/22   Ronnell Freshwater, NP  senna-docusate (SENOKOT-S) 8.6-50 MG tablet Take 2 tablets by mouth at bedtime. 03/30/22   Setzer, Edman Circle, PA-C  tamsulosin (FLOMAX) 0.4 MG CAPS capsule Take 1 capsule (0.4 mg total) by mouth daily after supper. 05/13/22   Thurnell Lose, MD  traZODone (DESYREL) 50 MG tablet Take 0.5-1 tablets (25-50 mg total) by mouth at bedtime as needed for sleep. 04/24/22   Ronnell Freshwater, NP  Vitamin D, Ergocalciferol, (DRISDOL) 1.25 MG (50000 UNIT) CAPS capsule Take 1 capsule (50,000 Units total) by mouth every Sunday. Take one tablet wkly 04/30/22   Ronnell Freshwater, NP      Allergies    Other    Review of Systems   Review of Systems  Gastrointestinal:  Positive for rectal pain.  Genitourinary:  Positive for dysuria.  All other systems reviewed and are negative.   Physical Exam Updated Vital Signs BP 128/80 (BP Location: Right Arm)   Pulse 78   Temp 98.4 F (36.9 C) (Oral)   Resp 18   Ht '5\' 11"'$  (1.803 m)   Wt 83.9 kg   SpO2 96%   BMI 25.80 kg/m   Physical Exam Vitals and nursing note reviewed.  Constitutional:      Appearance: He is well-developed.  HENT:     Head: Normocephalic and atraumatic.  Eyes:     Conjunctiva/sclera: Conjunctivae normal.     Pupils: Pupils are equal, round, and reactive to light.  Cardiovascular:     Rate and Rhythm: Normal rate and regular rhythm.     Heart sounds: Normal heart sounds.  Pulmonary:     Effort: Pulmonary effort is normal. No respiratory distress.     Breath sounds: Normal breath sounds. No rhonchi.  Abdominal:     General: Bowel sounds are normal.     Palpations: Abdomen is soft.  Musculoskeletal:        General: Normal range of motion.     Cervical back: Normal range of motion.  Skin:    General: Skin is warm and  dry.  Neurological:     Mental Status: He is alert and oriented to person, place, and time.     Comments: AAOx3, left sided hemiparesis which is baseline     ED Results / Procedures / Treatments   Labs (all labs ordered are listed, but only abnormal results are displayed) Labs Reviewed  URINE CULTURE  CBC WITH DIFFERENTIAL/PLATELET  COMPREHENSIVE METABOLIC PANEL  LIPASE, BLOOD  URINALYSIS, ROUTINE W REFLEX MICROSCOPIC    EKG None  Radiology No results found.  Procedures Procedures  {Document cardiac monitor, telemetry assessment procedure when appropriate:1}  Medications Ordered in ED Medications - No data to display  ED Course/ Medical Decision Making/  A&P                           Medical Decision Making Amount and/or Complexity of Data Reviewed Labs: ordered. Radiology: ordered.   ***  {Document critical care time when appropriate:1} {Document review of labs and clinical decision tools ie heart score, Chads2Vasc2 etc:1}  {Document your independent review of radiology images, and any outside records:1} {Document your discussion with family members, caretakers, and with consultants:1} {Document social determinants of health affecting pt's care:1} {Document your decision making why or why not admission, treatments were needed:1} Final Clinical Impression(s) / ED Diagnoses Final diagnoses:  None    Rx / DC Orders ED Discharge Orders     None

## 2022-09-02 DIAGNOSIS — R300 Dysuria: Secondary | ICD-10-CM | POA: Diagnosis not present

## 2022-09-02 DIAGNOSIS — Z7401 Bed confinement status: Secondary | ICD-10-CM | POA: Diagnosis not present

## 2022-09-02 LAB — COMPREHENSIVE METABOLIC PANEL
ALT: 16 U/L (ref 0–44)
AST: 14 U/L — ABNORMAL LOW (ref 15–41)
Albumin: 3.6 g/dL (ref 3.5–5.0)
Alkaline Phosphatase: 72 U/L (ref 38–126)
Anion gap: 4 — ABNORMAL LOW (ref 5–15)
BUN: 14 mg/dL (ref 8–23)
CO2: 30 mmol/L (ref 22–32)
Calcium: 8.8 mg/dL — ABNORMAL LOW (ref 8.9–10.3)
Chloride: 108 mmol/L (ref 98–111)
Creatinine, Ser: 0.74 mg/dL (ref 0.61–1.24)
GFR, Estimated: >60 mL/min (ref >60)
Glucose, Bld: 97 mg/dL (ref 70–99)
Potassium: 3.9 mmol/L (ref 3.5–5.1)
Sodium: 142 mmol/L (ref 135–145)
Total Bilirubin: 0.6 mg/dL (ref 0.3–1.2)
Total Protein: 6.3 g/dL — ABNORMAL LOW (ref 6.5–8.1)

## 2022-09-02 LAB — CBC WITH DIFFERENTIAL/PLATELET
Abs Immature Granulocytes: 0.01 10*3/uL (ref 0.00–0.07)
Basophils Absolute: 0 10*3/uL (ref 0.0–0.1)
Basophils Relative: 0 %
Eosinophils Absolute: 0.1 10*3/uL (ref 0.0–0.5)
Eosinophils Relative: 1 %
HCT: 36.7 % — ABNORMAL LOW (ref 39.0–52.0)
Hemoglobin: 11.2 g/dL — ABNORMAL LOW (ref 13.0–17.0)
Immature Granulocytes: 0 %
Lymphocytes Relative: 11 %
Lymphs Abs: 0.7 10*3/uL (ref 0.7–4.0)
MCH: 26.9 pg (ref 26.0–34.0)
MCHC: 30.5 g/dL (ref 30.0–36.0)
MCV: 88.2 fL (ref 80.0–100.0)
Monocytes Absolute: 0.5 10*3/uL (ref 0.1–1.0)
Monocytes Relative: 8 %
Neutro Abs: 5.3 10*3/uL (ref 1.7–7.7)
Neutrophils Relative %: 80 %
Platelets: 259 10*3/uL (ref 150–400)
RBC: 4.16 MIL/uL — ABNORMAL LOW (ref 4.22–5.81)
RDW: 14.6 % (ref 11.5–15.5)
WBC: 6.6 10*3/uL (ref 4.0–10.5)
nRBC: 0 % (ref 0.0–0.2)

## 2022-09-02 LAB — URINALYSIS, ROUTINE W REFLEX MICROSCOPIC
Bacteria, UA: NONE SEEN
Bilirubin Urine: NEGATIVE
Glucose, UA: NEGATIVE mg/dL
Hgb urine dipstick: NEGATIVE
Ketones, ur: NEGATIVE mg/dL
Nitrite: NEGATIVE
Protein, ur: NEGATIVE mg/dL
Specific Gravity, Urine: 1.009 (ref 1.005–1.030)
pH: 8 (ref 5.0–8.0)

## 2022-09-02 LAB — LIPASE, BLOOD: Lipase: 32 U/L (ref 11–51)

## 2022-09-02 MED ORDER — CEPHALEXIN 500 MG PO CAPS: 500.0000 mg | ORAL_CAPSULE | Freq: Three times a day (TID) | ORAL | 0 refills | Status: DC

## 2022-09-02 NOTE — ED Notes (Signed)
[facility] on unit to transport pt back, called placed to [facility] to let them know pt leaving shortly

## 2022-09-02 NOTE — Discharge Instructions (Signed)
Take the prescribed medication as directed.  Continue bowel regimen with miralax and colace.  Increase fiber, water intake. Follow-up with your primary care doctor. Return to the ED for new or worsening symptoms.

## 2022-09-02 NOTE — ED Notes (Signed)
Pt had moderate BM and brief noted to be soaked with urine. Pt cleaned, brief changed and primofit applied.

## 2022-09-02 NOTE — ED Notes (Signed)
[facility] called for transport back to [facility] 

## 2022-09-03 LAB — URINE CULTURE

## 2022-09-04 DIAGNOSIS — G936 Cerebral edema: Secondary | ICD-10-CM | POA: Diagnosis not present

## 2022-09-04 DIAGNOSIS — Z09 Encounter for follow-up examination after completed treatment for conditions other than malignant neoplasm: Secondary | ICD-10-CM | POA: Diagnosis not present

## 2022-09-04 DIAGNOSIS — K5909 Other constipation: Secondary | ICD-10-CM | POA: Diagnosis not present

## 2022-09-04 DIAGNOSIS — R2689 Other abnormalities of gait and mobility: Secondary | ICD-10-CM | POA: Diagnosis not present

## 2022-09-04 DIAGNOSIS — N3000 Acute cystitis without hematuria: Secondary | ICD-10-CM | POA: Diagnosis not present

## 2022-09-04 DIAGNOSIS — F332 Major depressive disorder, recurrent severe without psychotic features: Secondary | ICD-10-CM | POA: Diagnosis not present

## 2022-09-04 DIAGNOSIS — Z8719 Personal history of other diseases of the digestive system: Secondary | ICD-10-CM | POA: Diagnosis not present

## 2022-09-05 DIAGNOSIS — R2689 Other abnormalities of gait and mobility: Secondary | ICD-10-CM | POA: Diagnosis not present

## 2022-09-05 DIAGNOSIS — G936 Cerebral edema: Secondary | ICD-10-CM | POA: Diagnosis not present

## 2022-09-06 ENCOUNTER — Telehealth: Payer: Self-pay

## 2022-09-06 DIAGNOSIS — R2689 Other abnormalities of gait and mobility: Secondary | ICD-10-CM | POA: Diagnosis not present

## 2022-09-06 DIAGNOSIS — G936 Cerebral edema: Secondary | ICD-10-CM | POA: Diagnosis not present

## 2022-09-06 NOTE — Telephone Encounter (Signed)
        Patient  visited [facility] on 11/11     Telephone encounter attempt :  1st  A HIPAA compliant voice message was left requesting a return call.  Instructed patient to call back   [facility]  [phone] [address], [place], [place] [address] Phone: [phone] Email: [email or web address]

## 2022-09-07 DIAGNOSIS — R2689 Other abnormalities of gait and mobility: Secondary | ICD-10-CM | POA: Diagnosis not present

## 2022-09-07 DIAGNOSIS — G936 Cerebral edema: Secondary | ICD-10-CM | POA: Diagnosis not present

## 2022-09-08 DIAGNOSIS — G936 Cerebral edema: Secondary | ICD-10-CM | POA: Diagnosis not present

## 2022-09-08 DIAGNOSIS — R2689 Other abnormalities of gait and mobility: Secondary | ICD-10-CM | POA: Diagnosis not present

## 2022-09-10 DIAGNOSIS — R2689 Other abnormalities of gait and mobility: Secondary | ICD-10-CM | POA: Diagnosis not present

## 2022-09-10 DIAGNOSIS — G936 Cerebral edema: Secondary | ICD-10-CM | POA: Diagnosis not present

## 2022-09-11 DIAGNOSIS — R2689 Other abnormalities of gait and mobility: Secondary | ICD-10-CM | POA: Diagnosis not present

## 2022-09-11 DIAGNOSIS — G936 Cerebral edema: Secondary | ICD-10-CM | POA: Diagnosis not present

## 2022-09-12 DIAGNOSIS — R2689 Other abnormalities of gait and mobility: Secondary | ICD-10-CM | POA: Diagnosis not present

## 2022-09-12 DIAGNOSIS — G936 Cerebral edema: Secondary | ICD-10-CM | POA: Diagnosis not present

## 2022-09-13 DIAGNOSIS — R2689 Other abnormalities of gait and mobility: Secondary | ICD-10-CM | POA: Diagnosis not present

## 2022-09-13 DIAGNOSIS — G936 Cerebral edema: Secondary | ICD-10-CM | POA: Diagnosis not present

## 2022-09-14 DIAGNOSIS — F332 Major depressive disorder, recurrent severe without psychotic features: Secondary | ICD-10-CM | POA: Diagnosis not present

## 2022-09-15 DIAGNOSIS — G936 Cerebral edema: Secondary | ICD-10-CM | POA: Diagnosis not present

## 2022-09-15 DIAGNOSIS — R2689 Other abnormalities of gait and mobility: Secondary | ICD-10-CM | POA: Diagnosis not present

## 2022-09-18 DIAGNOSIS — R2689 Other abnormalities of gait and mobility: Secondary | ICD-10-CM | POA: Diagnosis not present

## 2022-09-18 DIAGNOSIS — G936 Cerebral edema: Secondary | ICD-10-CM | POA: Diagnosis not present

## 2022-09-19 DIAGNOSIS — G936 Cerebral edema: Secondary | ICD-10-CM | POA: Diagnosis not present

## 2022-09-19 DIAGNOSIS — R2689 Other abnormalities of gait and mobility: Secondary | ICD-10-CM | POA: Diagnosis not present

## 2022-09-20 DIAGNOSIS — G936 Cerebral edema: Secondary | ICD-10-CM | POA: Diagnosis not present

## 2022-09-20 DIAGNOSIS — R2689 Other abnormalities of gait and mobility: Secondary | ICD-10-CM | POA: Diagnosis not present

## 2022-09-21 DIAGNOSIS — R2689 Other abnormalities of gait and mobility: Secondary | ICD-10-CM | POA: Diagnosis not present

## 2022-09-21 DIAGNOSIS — G936 Cerebral edema: Secondary | ICD-10-CM | POA: Diagnosis not present

## 2022-09-22 ENCOUNTER — Emergency Department (HOSPITAL_BASED_OUTPATIENT_CLINIC_OR_DEPARTMENT_OTHER): Payer: Medicare HMO | Admitting: Radiology

## 2022-09-22 ENCOUNTER — Emergency Department (HOSPITAL_BASED_OUTPATIENT_CLINIC_OR_DEPARTMENT_OTHER)
Admission: EM | Admit: 2022-09-22 | Discharge: 2022-09-22 | Disposition: A | Discharge status: Home / Self Care | Payer: Medicare HMO | Attending: Emergency Medicine | Admitting: Emergency Medicine

## 2022-09-22 ENCOUNTER — Other Ambulatory Visit: Payer: Self-pay

## 2022-09-22 ENCOUNTER — Emergency Department (HOSPITAL_BASED_OUTPATIENT_CLINIC_OR_DEPARTMENT_OTHER): Payer: Medicare HMO

## 2022-09-22 ENCOUNTER — Encounter (HOSPITAL_BASED_OUTPATIENT_CLINIC_OR_DEPARTMENT_OTHER): Payer: Self-pay | Admitting: Emergency Medicine

## 2022-09-22 DIAGNOSIS — K8020 Calculus of gallbladder without cholecystitis without obstruction: Secondary | ICD-10-CM | POA: Diagnosis not present

## 2022-09-22 DIAGNOSIS — G8929 Other chronic pain: Secondary | ICD-10-CM | POA: Diagnosis not present

## 2022-09-22 DIAGNOSIS — R1030 Lower abdominal pain, unspecified: Secondary | ICD-10-CM | POA: Diagnosis present

## 2022-09-22 DIAGNOSIS — N3289 Other specified disorders of bladder: Secondary | ICD-10-CM | POA: Diagnosis not present

## 2022-09-22 DIAGNOSIS — R519 Headache, unspecified: Secondary | ICD-10-CM | POA: Diagnosis not present

## 2022-09-22 DIAGNOSIS — M25519 Pain in unspecified shoulder: Secondary | ICD-10-CM | POA: Diagnosis not present

## 2022-09-22 DIAGNOSIS — R531 Weakness: Secondary | ICD-10-CM | POA: Diagnosis not present

## 2022-09-22 DIAGNOSIS — Z7401 Bed confinement status: Secondary | ICD-10-CM | POA: Diagnosis not present

## 2022-09-22 DIAGNOSIS — R2689 Other abnormalities of gait and mobility: Secondary | ICD-10-CM | POA: Diagnosis not present

## 2022-09-22 DIAGNOSIS — R52 Pain, unspecified: Secondary | ICD-10-CM

## 2022-09-22 DIAGNOSIS — K5900 Constipation, unspecified: Secondary | ICD-10-CM

## 2022-09-22 DIAGNOSIS — G936 Cerebral edema: Secondary | ICD-10-CM | POA: Diagnosis not present

## 2022-09-22 DIAGNOSIS — K56609 Unspecified intestinal obstruction, unspecified as to partial versus complete obstruction: Secondary | ICD-10-CM | POA: Diagnosis not present

## 2022-09-22 DIAGNOSIS — M25512 Pain in left shoulder: Secondary | ICD-10-CM | POA: Diagnosis not present

## 2022-09-22 MED ORDER — GABAPENTIN 300 MG PO CAPS: 300.0000 mg | ORAL_CAPSULE | Freq: Three times a day (TID) | ORAL | 0 refills | Status: DC

## 2022-09-22 MED ORDER — GABAPENTIN 300 MG PO CAPS
300.0000 mg | ORAL_CAPSULE | Freq: Once | ORAL | Status: AC
  Administered 2022-09-22: 300 mg via ORAL
  Filled 2022-09-22: qty 1

## 2022-09-22 NOTE — Discharge Instructions (Addendum)
Sevag had a CT scan of his head and his abdomen and x-ray of his shoulder.  There were no emergency findings.  He does have some constipation on his abdominal scan which should be discussed and managed by his facility.  Consider enema or suppository as needed.  He is also having some pain in his right forehead or temple which could be consistent with a trigeminal neuralgia or peripheral nerve pain.  I would recommend increasing his gabapentin briefly for the next several days to 300 mg 3 times a day.  This would help with both his nerve pain in his head as well as potentially his sciatica leg pain.  Further pain control should be managed by the facility provider.

## 2022-09-22 NOTE — ED Provider Notes (Signed)
[facility] EMERGENCY DEPT Provider Note   CSN: [id number] Arrival date & time: 09/22/22  1604     History  Chief Complaint  Patient presents with   Pain    Patrick Buchanan is a 71 y.o. male with chronic right-sided hemiplegia presented to ED with multiple complaints.  The patient reports that he began having a sudden significant headache earlier today.  He also reports he has chronic pain in his left shoulder due to his contracture and so that the nurse "popped his shoulder out" twice earlier today.  He is not able to move it at baseline.  He reports that he has sciatica down his left leg which feels worse today.  He also reports he has lower abdominal pain and feels that he is constipated, which has been a problem for him in the past.  HPI     Home Medications Prior to Admission medications   Medication Sig Start Date End Date Taking? Authorizing Provider  gabapentin (NEURONTIN) 300 MG capsule Take 1 capsule (300 mg total) by mouth 3 (three) times daily for 5 days. 09/22/22 09/27/22 Yes Reve Crocket, Carola Rhine, MD  acetaminophen (TYLENOL) 500 MG tablet Take 1,000 mg by mouth 2 (two) times daily.    [provider]  ALPRAZolam Duanne Moron) 0.5 MG tablet Take 1 tablet (0.5 mg total) by mouth 2 (two) times daily as needed for anxiety. 05/13/22   Thurnell Lose, MD  ARIPiprazole (ABILIFY) 10 MG tablet Take 10 mg by mouth daily. 06/09/22   [provider]  baclofen (LIORESAL) 10 MG tablet Take 1 tablet (10 mg total) by mouth 3 (three) times daily. 04/24/22   Ronnell Freshwater, NP  bisacodyl (DULCOLAX) 10 MG suppository Place 1 suppository (10 mg total) rectally daily as needed for moderate constipation. 05/13/22   Thurnell Lose, MD  cephALEXin (KEFLEX) 500 MG capsule Take 1 capsule (500 mg total) by mouth 3 (three) times daily. 09/02/22   Larene Pickett, PA-C  Cholecalciferol (VITAMIN D3 PO) Take 1 capsule by mouth daily.    [provider]  diclofenac  Sodium (VOLTAREN) 1 % GEL Apply 2 g topically 3 (three) times daily. 03/30/22   Setzer, Edman Circle, PA-C  escitalopram (LEXAPRO) 20 MG tablet Take 1 tablet (20 mg total) by mouth daily. 04/24/22   Ronnell Freshwater, NP  gabapentin (NEURONTIN) 100 MG capsule Take 1 capsule (100 mg total) by mouth 2 (two) times daily. 04/24/22   Ronnell Freshwater, NP  gabapentin (NEURONTIN) 300 MG capsule Take 300 mg by mouth daily. 05/16/22   [provider]  HYDROcodone-acetaminophen (NORCO/VICODIN) 5-325 MG tablet Take by mouth. Patient not taking: Reported on 06/17/2022 05/17/22   [provider]  ketoconazole (NIZORAL) 2 % cream Apply 1 Application topically daily. 06/14/22   [provider]  ketoconazole (NIZORAL) 2 % shampoo Apply 1 Application topically. Every 20 days 06/14/22   [provider]  lacosamide 100 MG TABS Take 1 tablet (100 mg total) by mouth 2 (two) times daily. 05/13/22   Thurnell Lose, MD  lamoTRIgine (LAMICTAL) 100 MG tablet Take 1 tablet (100 mg total) by mouth 2 (two) times daily. 04/24/22   Ronnell Freshwater, NP  linaclotide (LINZESS) 145 MCG CAPS capsule Take 1 capsule (145 mcg total) by mouth daily before breakfast. 04/24/22   Ronnell Freshwater, NP  magnesium gluconate (MAGONATE) 500 MG tablet Take 0.5 tablets (250 mg total) by mouth at bedtime. Patient taking differently: Take 250 mg by  mouth every other day. 04/24/22   Ronnell Freshwater, NP  magnesium hydroxide (MILK OF MAGNESIA) 400 MG/5ML suspension Take 30 mLs by mouth daily as needed for mild constipation.    [provider]  Multiple Vitamin (MULTIVITAMIN) tablet Take 1 tablet by mouth daily.    [provider]  pantoprazole (PROTONIX) 40 MG tablet Take 1 tablet (40 mg total) by mouth at bedtime. 04/24/22   Ronnell Freshwater, NP  polyethylene glycol (MIRALAX) 17 g packet Take 17 g by mouth daily. 07/22/22   Rancour, Annie Main, MD  polyvinyl alcohol (LIQUIFILM TEARS) 1.4 % ophthalmic solution Place 1  drop into both eyes as needed for dry eyes.    [provider]  QUEtiapine (SEROQUEL) 50 MG tablet Take 1 tablet (50 mg total) by mouth at bedtime. 05/13/22   Thurnell Lose, MD  rosuvastatin (CRESTOR) 20 MG tablet Take 1 tablet (20 mg total) by mouth daily. 04/24/22   Ronnell Freshwater, NP  senna-docusate (SENOKOT-S) 8.6-50 MG tablet Take 2 tablets by mouth at bedtime. 03/30/22   Setzer, Edman Circle, PA-C  tamsulosin (FLOMAX) 0.4 MG CAPS capsule Take 1 capsule (0.4 mg total) by mouth daily after supper. 05/13/22   Thurnell Lose, MD  traZODone (DESYREL) 50 MG tablet Take 0.5-1 tablets (25-50 mg total) by mouth at bedtime as needed for sleep. 04/24/22   Ronnell Freshwater, NP  Vitamin D, Ergocalciferol, (DRISDOL) 1.25 MG (50000 UNIT) CAPS capsule Take 1 capsule (50,000 Units total) by mouth every Sunday. Take one tablet wkly 04/30/22   Ronnell Freshwater, NP      Allergies    Other    Review of Systems   Review of Systems  Physical Exam Updated Vital Signs BP 115/70 (BP Location: Right Arm)   Pulse 70   Temp 98.6 F (37 C) (Oral)   Resp 16   Ht '5\' 11"'$  (1.803 m)   Wt 83.9 kg   SpO2 100%   BMI 25.80 kg/m  Physical Exam Constitutional:      General: He is not in acute distress. HENT:     Head: Normocephalic and atraumatic.  Eyes:     Conjunctiva/sclera: Conjunctivae normal.     Pupils: Pupils are equal, round, and reactive to light.  Cardiovascular:     Rate and Rhythm: Normal rate and regular rhythm.  Pulmonary:     Effort: Pulmonary effort is normal. No respiratory distress.  Abdominal:     General: There is no distension.     Tenderness: There is no abdominal tenderness.  Musculoskeletal:     Comments: Contracture left upper extremity with chronic shoulder pain, no visible deformity  Skin:    General: Skin is warm and dry.  Neurological:     General: No focal deficit present.     Mental Status: He is alert. Mental status is at baseline.     ED Results / Procedures  / Treatments   Labs (all labs ordered are listed, but only abnormal results are displayed) Labs Reviewed - No data to display  EKG None  Radiology CT Head Wo Contrast  Result Date: 09/22/2022 CLINICAL DATA:  Headache, new onset EXAM: CT HEAD WITHOUT CONTRAST TECHNIQUE: Contiguous axial images were obtained from the base of the skull through the vertex without intravenous contrast. RADIATION DOSE REDUCTION: This exam was performed according to the departmental dose-optimization program which includes automated exposure control, adjustment of the mA and/or kV according to patient size and/or use of iterative reconstruction technique.  COMPARISON:  CT of the head without contrast 06/17/2022. FINDINGS: Brain: Multiple meningiomas are again noted bilaterally. The largest lesion is along the left petrous ridge measuring 4.3 x 2.5 cm, not significantly changed. Right craniotomy noted with areas of encephalomalacia along the posterior right frontal lobe and parietal lobes stable. Ex vacuo dilation of the right lateral ventricle is associated with volume loss. No new lesions are present. Right pterional craniotomy for aneurysm clipping is again noted. Edema surrounding the largest lesions, namely along the left petrous ridge and adjacent to the left parietal lobe is similar to the prior exam. Midline shift is stable to slightly increased of 4 mm. Vascular: Minimal atherosclerotic changes are present within the cavernous internal carotid arteries bilaterally. No hyperdense vessels are present. Skull: Craniotomies as described. No acute osseous abnormality. No significant extracranial soft tissue lesion is present. Sinuses/Orbits: The paranasal sinuses and mastoid air cells are clear. The globes and orbits are within normal limits. IMPRESSION: 1. No acute intracranial abnormality or significant interval change. 2. Stable appearance of multiple meningiomas bilaterally. 3. Stable encephalomalacia along the posterior  right frontal lobe and parietal lobes. 4. Stable ex vacuo dilation of the right lateral ventricle. 5. Stable to slightly increased midline shift of 4 mm. 6. Right pterional craniotomy for aneurysm clipping. Electronically Signed   By: San Morelle M.D.   On: 09/22/2022 18:58   CT ABDOMEN PELVIS WO CONTRAST  Result Date: 09/22/2022 CLINICAL DATA:  Generalized abdominal pain. Bowel obstruction suspected EXAM: CT ABDOMEN AND PELVIS WITHOUT CONTRAST TECHNIQUE: Multidetector CT imaging of the abdomen and pelvis was performed following the standard protocol without IV contrast. RADIATION DOSE REDUCTION: This exam was performed according to the departmental dose-optimization program which includes automated exposure control, adjustment of the mA and/or kV according to patient size and/or use of iterative reconstruction technique. COMPARISON:  07/22/2022 FINDINGS: Lower chest: Small right and trace left pleural effusions with mild compressive atelectasis. Heart size within normal limits. Hepatobiliary: Unremarkable unenhanced appearance of the liver. Multiple stones within the gallbladder. No gallbladder wall thickening or pericholecystic inflammatory changes by CT. No biliary dilatation. Pancreas: Unremarkable. No pancreatic ductal dilatation or surrounding inflammatory changes. Spleen: Normal in size without focal abnormality. Adrenals/Urinary Tract: Adrenal glands are unremarkable. Kidneys are normal, without renal calculi, focal lesion, or hydronephrosis. Mild urinary bladder wall thickening which may be accentuated by under distension. Stomach/Bowel: Stomach is within normal limits. Appendix is surgically absent. Moderate volume of stool within the colon and rectum. No evidence of bowel wall thickening, distention, or inflammatory changes. Vascular/Lymphatic: Aortic atherosclerosis. No enlarged abdominal or pelvic lymph nodes. Reproductive: Prostatomegaly. Other: No free fluid. No abdominopelvic fluid  collection. No pneumoperitoneum. Musculoskeletal: No acute or significant osseous findings. IMPRESSION: 1. No acute abdominopelvic findings. 2. Moderate volume of stool within the colon and rectum. 3. Cholelithiasis without evidence of acute cholecystitis. 4. Small right and trace left pleural effusions with mild compressive atelectasis. 5. Mild urinary bladder wall thickening which may be accentuated by under distension. Correlate with urinalysis to exclude cystitis. 6. Prostatomegaly. 7. Aortic atherosclerosis (ICD10-I70.0). Electronically Signed   By: Davina Poke D.O.   On: 09/22/2022 18:53   DG Shoulder Left  Result Date: 09/22/2022 CLINICAL DATA:  Left shoulder pain.  No known injury. EXAM: LEFT SHOULDER - 2+ VIEW COMPARISON:  None Available. FINDINGS: The bones appear mildly demineralized. There are mild acromioclavicular and glenohumeral degenerative changes. No evidence of acute fracture or dislocation. The subacromial space appears adequately preserved, and no focal soft tissue  abnormalities are identified. IMPRESSION: Mild degenerative changes. No acute osseous findings. Electronically Signed   By: Richardean Sale M.D.   On: 09/22/2022 18:48    Procedures Procedures    Medications Ordered in ED Medications  gabapentin (NEURONTIN) capsule 300 mg (300 mg Oral Given 09/22/22 2025)    ED Course/ Medical Decision Making/ A&P                           Medical Decision Making Amount and/or Complexity of Data Reviewed Radiology: ordered.  Risk Prescription drug management.   Patient is here with multiple complaints from a nursing facility.  EMS provides supplemental history.  Based on the patient's complaints of medical presentation, I have ordered a CT scan of the head, as well as the abdomen, to evaluate for ileus versus bowel obstruction.  I also ordered an x-ray of his left shoulder, to evaluate for potential dislocation versus subluxation.   CT imaging personally reviewed and  interpreted, no acute emergent findings.  X-ray imaging with no emergent findings.  He does have some constipation.  I given gabapentin 300 mg here which may help with his facial pain, which I suspect may be related to trigeminal nerve pain.  I explained to him that we need to avoid opioid narcotics due to his chronic ongoing constipation.  His nursing facility should be able to manage constipation issues with enema, suppositories as needed.   Likewise his facility should be managing his chronic pain.        Final Clinical Impression(s) / ED Diagnoses Final diagnoses:  Pain  Constipation, unspecified constipation type    Rx / DC Orders ED Discharge Orders          Ordered    gabapentin (NEURONTIN) 300 MG capsule  3 times daily        09/22/22 1909              Wyvonnia Dusky, MD 09/22/22 2307

## 2022-09-22 NOTE — ED Notes (Addendum)
Called [facility] for transportation at 723pm, 4 others ahead of patient.  Called [facility] to give patient update, 2 others head of him.

## 2022-09-22 NOTE — ED Triage Notes (Signed)
Pt arrives to ED via [facility] with c/o generalized body pain.

## 2022-09-25 DIAGNOSIS — K5909 Other constipation: Secondary | ICD-10-CM | POA: Diagnosis not present

## 2022-09-25 DIAGNOSIS — M19012 Primary osteoarthritis, left shoulder: Secondary | ICD-10-CM | POA: Diagnosis not present

## 2022-09-25 DIAGNOSIS — D329 Benign neoplasm of meninges, unspecified: Secondary | ICD-10-CM | POA: Diagnosis not present

## 2022-09-25 DIAGNOSIS — F332 Major depressive disorder, recurrent severe without psychotic features: Secondary | ICD-10-CM | POA: Diagnosis not present

## 2022-09-27 DIAGNOSIS — D329 Benign neoplasm of meninges, unspecified: Secondary | ICD-10-CM | POA: Diagnosis not present

## 2022-10-02 DIAGNOSIS — E119 Type 2 diabetes mellitus without complications: Secondary | ICD-10-CM | POA: Diagnosis not present

## 2022-10-02 DIAGNOSIS — G936 Cerebral edema: Secondary | ICD-10-CM | POA: Diagnosis not present

## 2022-10-02 DIAGNOSIS — E0800 Diabetes mellitus due to underlying condition with hyperosmolarity without nonketotic hyperglycemic-hyperosmolar coma (NKHHC): Secondary | ICD-10-CM | POA: Diagnosis not present

## 2022-10-02 DIAGNOSIS — Z1321 Encounter for screening for nutritional disorder: Secondary | ICD-10-CM | POA: Diagnosis not present

## 2022-10-02 DIAGNOSIS — Z131 Encounter for screening for diabetes mellitus: Secondary | ICD-10-CM | POA: Diagnosis not present

## 2022-10-02 DIAGNOSIS — E612 Magnesium deficiency: Secondary | ICD-10-CM | POA: Diagnosis not present

## 2022-10-02 DIAGNOSIS — F332 Major depressive disorder, recurrent severe without psychotic features: Secondary | ICD-10-CM | POA: Diagnosis not present

## 2022-10-02 DIAGNOSIS — E785 Hyperlipidemia, unspecified: Secondary | ICD-10-CM | POA: Diagnosis not present

## 2022-10-09 DIAGNOSIS — S31809A Unspecified open wound of unspecified buttock, initial encounter: Secondary | ICD-10-CM | POA: Diagnosis not present

## 2022-10-09 DIAGNOSIS — G8194 Hemiplegia, unspecified affecting left nondominant side: Secondary | ICD-10-CM | POA: Diagnosis not present

## 2022-10-09 DIAGNOSIS — F332 Major depressive disorder, recurrent severe without psychotic features: Secondary | ICD-10-CM | POA: Diagnosis not present

## 2022-10-09 DIAGNOSIS — G40909 Epilepsy, unspecified, not intractable, without status epilepticus: Secondary | ICD-10-CM | POA: Diagnosis not present

## 2022-10-09 DIAGNOSIS — E118 Type 2 diabetes mellitus with unspecified complications: Secondary | ICD-10-CM | POA: Diagnosis not present

## 2022-10-09 DIAGNOSIS — G8114 Spastic hemiplegia affecting left nondominant side: Secondary | ICD-10-CM | POA: Diagnosis not present

## 2022-10-09 DIAGNOSIS — D329 Benign neoplasm of meninges, unspecified: Secondary | ICD-10-CM | POA: Diagnosis not present

## 2022-10-09 DIAGNOSIS — F639 Impulse disorder, unspecified: Secondary | ICD-10-CM | POA: Diagnosis not present

## 2022-10-09 DIAGNOSIS — F32A Depression, unspecified: Secondary | ICD-10-CM | POA: Diagnosis not present

## 2022-10-09 DIAGNOSIS — L219 Seborrheic dermatitis, unspecified: Secondary | ICD-10-CM | POA: Diagnosis not present

## 2022-10-11 DIAGNOSIS — F411 Generalized anxiety disorder: Secondary | ICD-10-CM | POA: Diagnosis not present

## 2022-10-11 DIAGNOSIS — R4587 Impulsiveness: Secondary | ICD-10-CM | POA: Diagnosis not present

## 2022-10-11 DIAGNOSIS — F3341 Major depressive disorder, recurrent, in partial remission: Secondary | ICD-10-CM | POA: Diagnosis not present

## 2022-10-11 DIAGNOSIS — N390 Urinary tract infection, site not specified: Secondary | ICD-10-CM | POA: Diagnosis not present

## 2022-10-11 DIAGNOSIS — Z9189 Other specified personal risk factors, not elsewhere classified: Secondary | ICD-10-CM | POA: Diagnosis not present

## 2022-10-11 DIAGNOSIS — R4182 Altered mental status, unspecified: Secondary | ICD-10-CM | POA: Diagnosis not present

## 2022-10-13 DIAGNOSIS — D518 Other vitamin B12 deficiency anemias: Secondary | ICD-10-CM | POA: Diagnosis not present

## 2022-10-13 DIAGNOSIS — E559 Vitamin D deficiency, unspecified: Secondary | ICD-10-CM | POA: Diagnosis not present

## 2022-10-13 DIAGNOSIS — E119 Type 2 diabetes mellitus without complications: Secondary | ICD-10-CM | POA: Diagnosis not present

## 2022-10-13 DIAGNOSIS — G936 Cerebral edema: Secondary | ICD-10-CM | POA: Diagnosis not present

## 2022-10-13 DIAGNOSIS — E612 Magnesium deficiency: Secondary | ICD-10-CM | POA: Diagnosis not present

## 2022-10-13 DIAGNOSIS — D649 Anemia, unspecified: Secondary | ICD-10-CM | POA: Diagnosis not present

## 2022-10-13 DIAGNOSIS — E0800 Diabetes mellitus due to underlying condition with hyperosmolarity without nonketotic hyperglycemic-hyperosmolar coma (NKHHC): Secondary | ICD-10-CM | POA: Diagnosis not present

## 2022-10-13 DIAGNOSIS — N390 Urinary tract infection, site not specified: Secondary | ICD-10-CM | POA: Diagnosis not present

## 2022-10-13 DIAGNOSIS — E039 Hypothyroidism, unspecified: Secondary | ICD-10-CM | POA: Diagnosis not present

## 2022-10-15 ENCOUNTER — Other Ambulatory Visit: Payer: Self-pay

## 2022-10-15 ENCOUNTER — Emergency Department (HOSPITAL_BASED_OUTPATIENT_CLINIC_OR_DEPARTMENT_OTHER)
Admission: EM | Admit: 2022-10-15 | Discharge: 2022-10-15 | Disposition: A | Discharge status: Home / Self Care | Payer: Medicare HMO | Attending: Emergency Medicine | Admitting: Emergency Medicine

## 2022-10-15 ENCOUNTER — Encounter (HOSPITAL_BASED_OUTPATIENT_CLINIC_OR_DEPARTMENT_OTHER): Payer: Self-pay

## 2022-10-15 DIAGNOSIS — R531 Weakness: Secondary | ICD-10-CM | POA: Diagnosis not present

## 2022-10-15 DIAGNOSIS — K5900 Constipation, unspecified: Secondary | ICD-10-CM | POA: Insufficient documentation

## 2022-10-15 DIAGNOSIS — R519 Headache, unspecified: Secondary | ICD-10-CM | POA: Diagnosis not present

## 2022-10-15 DIAGNOSIS — Z7401 Bed confinement status: Secondary | ICD-10-CM | POA: Diagnosis not present

## 2022-10-15 DIAGNOSIS — G4489 Other headache syndrome: Secondary | ICD-10-CM | POA: Diagnosis not present

## 2022-10-15 MED ORDER — GABAPENTIN 300 MG PO CAPS
600.0000 mg | ORAL_CAPSULE | Freq: Once | ORAL | Status: AC
  Administered 2022-10-15: 600 mg via ORAL
  Filled 2022-10-15: qty 2

## 2022-10-15 MED ORDER — IBUPROFEN 400 MG PO TABS
600.0000 mg | ORAL_TABLET | Freq: Once | ORAL | Status: DC
  Filled 2022-10-15: qty 1

## 2022-10-15 NOTE — ED Notes (Signed)
Patient, belongings, and paperwork transported via [facility] at this time to [facility]

## 2022-10-15 NOTE — ED Notes (Signed)
Patient screaming out for staff. Even and non labored respirations noted. Patient endorses left hand pain. Provider aware.

## 2022-10-15 NOTE — Discharge Instructions (Addendum)
I recommend Patrick Buchanan see the facility provider for his chronic complaints.  I did explained to the patient that he has many chronic medical problems which can be managed as an outpatient.

## 2022-10-15 NOTE — ED Notes (Addendum)
Attempted to call [facility] 3x at this time on nursing station phone. No Answer or voicemail available.

## 2022-10-15 NOTE — ED Provider Notes (Signed)
[facility] EMERGENCY DEPT Provider Note   CSN: [id number] Arrival date & time: 10/15/22  1711     History  Chief Complaint  Patient presents with   Headache    Patrick Buchanan is a 71 y.o. male presenting to the ER with multiple complaints.  The patient presents by ambulance from a nursing facility.  Patient has a history of stroke with chronic left-sided hemiplegia, chronic headaches, presenting to the ED complaining of recurring headache, left shoulder pain, bilateral leg pain, constipation.  These are the same complaints the patient had when I evaluated him at the start of the month and the emergency department.  At that time I did a CT brain scan which was stable, and advised that he follow-up with his facility provider.  HPI     Home Medications Prior to Admission medications   Medication Sig Start Date End Date Taking? Authorizing Provider  acetaminophen (TYLENOL) 500 MG tablet Take 1,000 mg by mouth 2 (two) times daily.    [provider]  ALPRAZolam Duanne Moron) 0.5 MG tablet Take 1 tablet (0.5 mg total) by mouth 2 (two) times daily as needed for anxiety. 05/13/22   Thurnell Lose, MD  ARIPiprazole (ABILIFY) 10 MG tablet Take 10 mg by mouth daily. 06/09/22   [provider]  baclofen (LIORESAL) 10 MG tablet Take 1 tablet (10 mg total) by mouth 3 (three) times daily. 04/24/22   Ronnell Freshwater, NP  bisacodyl (DULCOLAX) 10 MG suppository Place 1 suppository (10 mg total) rectally daily as needed for moderate constipation. 05/13/22   Thurnell Lose, MD  cephALEXin (KEFLEX) 500 MG capsule Take 1 capsule (500 mg total) by mouth 3 (three) times daily. 09/02/22   Larene Pickett, PA-C  Cholecalciferol (VITAMIN D3 PO) Take 1 capsule by mouth daily.    [provider]  diclofenac Sodium (VOLTAREN) 1 % GEL Apply 2 g topically 3 (three) times daily. 03/30/22   Setzer, Edman Circle, PA-C  escitalopram (LEXAPRO) 20 MG tablet Take 1 tablet (20 mg  total) by mouth daily. 04/24/22   Ronnell Freshwater, NP  gabapentin (NEURONTIN) 100 MG capsule Take 1 capsule (100 mg total) by mouth 2 (two) times daily. 04/24/22   Ronnell Freshwater, NP  gabapentin (NEURONTIN) 300 MG capsule Take 300 mg by mouth daily. 05/16/22   [provider]  gabapentin (NEURONTIN) 300 MG capsule Take 1 capsule (300 mg total) by mouth 3 (three) times daily for 5 days. 09/22/22 09/27/22  Wyvonnia Dusky, MD  HYDROcodone-acetaminophen (NORCO/VICODIN) 5-325 MG tablet Take by mouth. Patient not taking: Reported on 06/17/2022 05/17/22   [provider]  ketoconazole (NIZORAL) 2 % cream Apply 1 Application topically daily. 06/14/22   [provider]  ketoconazole (NIZORAL) 2 % shampoo Apply 1 Application topically. Every 20 days 06/14/22   [provider]  lacosamide 100 MG TABS Take 1 tablet (100 mg total) by mouth 2 (two) times daily. 05/13/22   Thurnell Lose, MD  lamoTRIgine (LAMICTAL) 100 MG tablet Take 1 tablet (100 mg total) by mouth 2 (two) times daily. 04/24/22   Ronnell Freshwater, NP  linaclotide (LINZESS) 145 MCG CAPS capsule Take 1 capsule (145 mcg total) by mouth daily before breakfast. 04/24/22   Ronnell Freshwater, NP  magnesium gluconate (MAGONATE) 500 MG tablet Take 0.5 tablets (250 mg total) by mouth at bedtime. Patient taking differently: Take 250 mg by mouth every other day. 04/24/22   Ronnell Freshwater, NP  magnesium hydroxide (MILK OF MAGNESIA) 400 MG/5ML suspension Take 30 mLs by mouth daily as needed for mild constipation.    [provider]  Multiple Vitamin (MULTIVITAMIN) tablet Take 1 tablet by mouth daily.    [provider]  pantoprazole (PROTONIX) 40 MG tablet Take 1 tablet (40 mg total) by mouth at bedtime. 04/24/22   Ronnell Freshwater, NP  polyethylene glycol (MIRALAX) 17 g packet Take 17 g by mouth daily. 07/22/22   Rancour, Annie Main, MD  polyvinyl alcohol (LIQUIFILM TEARS) 1.4 % ophthalmic solution Place 1 drop  into both eyes as needed for dry eyes.    [provider]  QUEtiapine (SEROQUEL) 50 MG tablet Take 1 tablet (50 mg total) by mouth at bedtime. 05/13/22   Thurnell Lose, MD  rosuvastatin (CRESTOR) 20 MG tablet Take 1 tablet (20 mg total) by mouth daily. 04/24/22   Ronnell Freshwater, NP  senna-docusate (SENOKOT-S) 8.6-50 MG tablet Take 2 tablets by mouth at bedtime. 03/30/22   Setzer, Edman Circle, PA-C  tamsulosin (FLOMAX) 0.4 MG CAPS capsule Take 1 capsule (0.4 mg total) by mouth daily after supper. 05/13/22   Thurnell Lose, MD  traZODone (DESYREL) 50 MG tablet Take 0.5-1 tablets (25-50 mg total) by mouth at bedtime as needed for sleep. 04/24/22   Ronnell Freshwater, NP  Vitamin D, Ergocalciferol, (DRISDOL) 1.25 MG (50000 UNIT) CAPS capsule Take 1 capsule (50,000 Units total) by mouth every Sunday. Take one tablet wkly 04/30/22   Ronnell Freshwater, NP      Allergies    Other    Review of Systems   Review of Systems  Physical Exam Updated Vital Signs BP 121/74   Pulse 66   Temp 98.8 F (37.1 C) (Oral)   Resp 13   SpO2 96%  Physical Exam Constitutional:      General: He is not in acute distress. HENT:     Head: Normocephalic and atraumatic.  Eyes:     Conjunctiva/sclera: Conjunctivae normal.     Pupils: Pupils are equal, round, and reactive to light.  Cardiovascular:     Rate and Rhythm: Normal rate and regular rhythm.  Pulmonary:     Effort: Pulmonary effort is normal. No respiratory distress.  Abdominal:     General: There is no distension.     Tenderness: There is no abdominal tenderness.  Skin:    General: Skin is warm and dry.  Neurological:     General: No focal deficit present.     Mental Status: He is alert. Mental status is at baseline.     Comments: Hemiplegia unchanged     ED Results / Procedures / Treatments   Labs (all labs ordered are listed, but only abnormal results are displayed) Labs Reviewed - No data to display  EKG None  Radiology No  results found.  Procedures Procedures    Medications Ordered in ED Medications  ibuprofen (ADVIL) tablet 600 mg (600 mg Oral Patient Refused/Not Given 10/15/22 1917)  gabapentin (NEURONTIN) capsule 600 mg (600 mg Oral Given 10/15/22 1854)    ED Course/ Medical Decision Making/ A&P                           Medical Decision Making Risk Prescription drug management.   Chronic complaints -I do not suspect DVT, stroke.  Not believe any repeat CT imaging of the head or the shoulder, which suspect is chronic for frozen shoulder or impingement from  his hemiaplasia.  He does not demonstrate evidence of bowel obstruction or perforation or acute abdominal surgical emergency to warrant CT imaging at this time.  They can manage his constipation with Fleet enemas at his facility.  He is stable for discharge at this time.  Gabapentin was ordered for pain         Final Clinical Impression(s) / ED Diagnoses Final diagnoses:  Constipation, unspecified constipation type  Nonintractable headache, unspecified chronicity pattern, unspecified headache type    Rx / DC Orders ED Discharge Orders     None         Wyvonnia Dusky, MD 10/15/22 2328

## 2022-10-15 NOTE — ED Notes (Signed)
Called [facility] for the patient

## 2022-10-15 NOTE — ED Triage Notes (Signed)
Pt BIB [facility] from [facility] for Right side sharp, stabbing headache x2-3 days. Associated with multiple other complaints   Left side paralysis d/t brain tumor    BP 100/60 HR 64 RR 18 93% RA CBG 105

## 2022-10-20 ENCOUNTER — Telehealth: Payer: Self-pay

## 2022-10-20 NOTE — Telephone Encounter (Signed)
        Patient  visited [facility] on 12/24    Telephone encounter attempt :  1st  A HIPAA compliant voice message was left requesting a return call.  Instructed patient to call back .    [facility], Care Management  [phone] [address], [place], [place] [address] Phone: [phone] Email: [email or web address]

## 2022-10-24 ENCOUNTER — Telehealth: Payer: Self-pay

## 2022-10-24 DIAGNOSIS — M24542 Contracture, left hand: Secondary | ICD-10-CM | POA: Diagnosis not present

## 2022-10-24 DIAGNOSIS — G8114 Spastic hemiplegia affecting left nondominant side: Secondary | ICD-10-CM | POA: Diagnosis not present

## 2022-10-24 DIAGNOSIS — G9340 Encephalopathy, unspecified: Secondary | ICD-10-CM | POA: Diagnosis not present

## 2022-10-24 DIAGNOSIS — G936 Cerebral edema: Secondary | ICD-10-CM | POA: Diagnosis not present

## 2022-10-24 DIAGNOSIS — R293 Abnormal posture: Secondary | ICD-10-CM | POA: Diagnosis not present

## 2022-10-24 NOTE — Telephone Encounter (Signed)
        Patient  visited [facility] on 12/24   Telephone encounter attempt :  2nd  A HIPAA compliant voice message was left requesting a return call.  Instructed patient to call back .    [facility], Care Management  [phone] [address], [place], [place] [address] Phone: [phone] Email: [email or web address]

## 2022-10-25 DIAGNOSIS — R293 Abnormal posture: Secondary | ICD-10-CM | POA: Diagnosis not present

## 2022-10-25 DIAGNOSIS — G936 Cerebral edema: Secondary | ICD-10-CM | POA: Diagnosis not present

## 2022-10-25 DIAGNOSIS — M24542 Contracture, left hand: Secondary | ICD-10-CM | POA: Diagnosis not present

## 2022-10-25 DIAGNOSIS — G9340 Encephalopathy, unspecified: Secondary | ICD-10-CM | POA: Diagnosis not present

## 2022-10-25 DIAGNOSIS — G8114 Spastic hemiplegia affecting left nondominant side: Secondary | ICD-10-CM | POA: Diagnosis not present

## 2022-10-26 DIAGNOSIS — M24542 Contracture, left hand: Secondary | ICD-10-CM | POA: Diagnosis not present

## 2022-10-26 DIAGNOSIS — G936 Cerebral edema: Secondary | ICD-10-CM | POA: Diagnosis not present

## 2022-10-26 DIAGNOSIS — G9340 Encephalopathy, unspecified: Secondary | ICD-10-CM | POA: Diagnosis not present

## 2022-10-26 DIAGNOSIS — G8114 Spastic hemiplegia affecting left nondominant side: Secondary | ICD-10-CM | POA: Diagnosis not present

## 2022-10-26 DIAGNOSIS — R293 Abnormal posture: Secondary | ICD-10-CM | POA: Diagnosis not present

## 2022-10-27 DIAGNOSIS — G936 Cerebral edema: Secondary | ICD-10-CM | POA: Diagnosis not present

## 2022-10-27 DIAGNOSIS — M24542 Contracture, left hand: Secondary | ICD-10-CM | POA: Diagnosis not present

## 2022-10-27 DIAGNOSIS — G9340 Encephalopathy, unspecified: Secondary | ICD-10-CM | POA: Diagnosis not present

## 2022-10-27 DIAGNOSIS — G8114 Spastic hemiplegia affecting left nondominant side: Secondary | ICD-10-CM | POA: Diagnosis not present

## 2022-10-27 DIAGNOSIS — R293 Abnormal posture: Secondary | ICD-10-CM | POA: Diagnosis not present

## 2022-10-30 DIAGNOSIS — G9340 Encephalopathy, unspecified: Secondary | ICD-10-CM | POA: Diagnosis not present

## 2022-10-30 DIAGNOSIS — R293 Abnormal posture: Secondary | ICD-10-CM | POA: Diagnosis not present

## 2022-10-30 DIAGNOSIS — G936 Cerebral edema: Secondary | ICD-10-CM | POA: Diagnosis not present

## 2022-10-30 DIAGNOSIS — M24542 Contracture, left hand: Secondary | ICD-10-CM | POA: Diagnosis not present

## 2022-10-30 DIAGNOSIS — G8114 Spastic hemiplegia affecting left nondominant side: Secondary | ICD-10-CM | POA: Diagnosis not present

## 2022-10-31 DIAGNOSIS — G9340 Encephalopathy, unspecified: Secondary | ICD-10-CM | POA: Diagnosis not present

## 2022-10-31 DIAGNOSIS — M24542 Contracture, left hand: Secondary | ICD-10-CM | POA: Diagnosis not present

## 2022-10-31 DIAGNOSIS — G936 Cerebral edema: Secondary | ICD-10-CM | POA: Diagnosis not present

## 2022-10-31 DIAGNOSIS — R293 Abnormal posture: Secondary | ICD-10-CM | POA: Diagnosis not present

## 2022-10-31 DIAGNOSIS — G8114 Spastic hemiplegia affecting left nondominant side: Secondary | ICD-10-CM | POA: Diagnosis not present

## 2022-11-01 DIAGNOSIS — M24542 Contracture, left hand: Secondary | ICD-10-CM | POA: Diagnosis not present

## 2022-11-01 DIAGNOSIS — G936 Cerebral edema: Secondary | ICD-10-CM | POA: Diagnosis not present

## 2022-11-01 DIAGNOSIS — G8114 Spastic hemiplegia affecting left nondominant side: Secondary | ICD-10-CM | POA: Diagnosis not present

## 2022-11-01 DIAGNOSIS — R293 Abnormal posture: Secondary | ICD-10-CM | POA: Diagnosis not present

## 2022-11-01 DIAGNOSIS — G9340 Encephalopathy, unspecified: Secondary | ICD-10-CM | POA: Diagnosis not present

## 2022-11-02 DIAGNOSIS — M24542 Contracture, left hand: Secondary | ICD-10-CM | POA: Diagnosis not present

## 2022-11-02 DIAGNOSIS — G8114 Spastic hemiplegia affecting left nondominant side: Secondary | ICD-10-CM | POA: Diagnosis not present

## 2022-11-02 DIAGNOSIS — G9340 Encephalopathy, unspecified: Secondary | ICD-10-CM | POA: Diagnosis not present

## 2022-11-02 DIAGNOSIS — G936 Cerebral edema: Secondary | ICD-10-CM | POA: Diagnosis not present

## 2022-11-02 DIAGNOSIS — R293 Abnormal posture: Secondary | ICD-10-CM | POA: Diagnosis not present

## 2022-11-03 DIAGNOSIS — G8114 Spastic hemiplegia affecting left nondominant side: Secondary | ICD-10-CM | POA: Diagnosis not present

## 2022-11-03 DIAGNOSIS — G9340 Encephalopathy, unspecified: Secondary | ICD-10-CM | POA: Diagnosis not present

## 2022-11-03 DIAGNOSIS — G936 Cerebral edema: Secondary | ICD-10-CM | POA: Diagnosis not present

## 2022-11-03 DIAGNOSIS — F411 Generalized anxiety disorder: Secondary | ICD-10-CM | POA: Diagnosis not present

## 2022-11-03 DIAGNOSIS — D329 Benign neoplasm of meninges, unspecified: Secondary | ICD-10-CM | POA: Diagnosis not present

## 2022-11-03 DIAGNOSIS — M24542 Contracture, left hand: Secondary | ICD-10-CM | POA: Diagnosis not present

## 2022-11-03 DIAGNOSIS — R293 Abnormal posture: Secondary | ICD-10-CM | POA: Diagnosis not present

## 2022-11-06 DIAGNOSIS — G936 Cerebral edema: Secondary | ICD-10-CM | POA: Diagnosis not present

## 2022-11-06 DIAGNOSIS — M24542 Contracture, left hand: Secondary | ICD-10-CM | POA: Diagnosis not present

## 2022-11-06 DIAGNOSIS — R293 Abnormal posture: Secondary | ICD-10-CM | POA: Diagnosis not present

## 2022-11-06 DIAGNOSIS — G9340 Encephalopathy, unspecified: Secondary | ICD-10-CM | POA: Diagnosis not present

## 2022-11-06 DIAGNOSIS — G8114 Spastic hemiplegia affecting left nondominant side: Secondary | ICD-10-CM | POA: Diagnosis not present

## 2022-11-07 DIAGNOSIS — G936 Cerebral edema: Secondary | ICD-10-CM | POA: Diagnosis not present

## 2022-11-07 DIAGNOSIS — G8114 Spastic hemiplegia affecting left nondominant side: Secondary | ICD-10-CM | POA: Diagnosis not present

## 2022-11-07 DIAGNOSIS — R293 Abnormal posture: Secondary | ICD-10-CM | POA: Diagnosis not present

## 2022-11-07 DIAGNOSIS — M24542 Contracture, left hand: Secondary | ICD-10-CM | POA: Diagnosis not present

## 2022-11-07 DIAGNOSIS — G9340 Encephalopathy, unspecified: Secondary | ICD-10-CM | POA: Diagnosis not present

## 2022-11-08 DIAGNOSIS — Z0189 Encounter for other specified special examinations: Secondary | ICD-10-CM | POA: Diagnosis not present

## 2022-11-08 DIAGNOSIS — G9340 Encephalopathy, unspecified: Secondary | ICD-10-CM | POA: Diagnosis not present

## 2022-11-08 DIAGNOSIS — G936 Cerebral edema: Secondary | ICD-10-CM | POA: Diagnosis not present

## 2022-11-08 DIAGNOSIS — R293 Abnormal posture: Secondary | ICD-10-CM | POA: Diagnosis not present

## 2022-11-08 DIAGNOSIS — K5901 Slow transit constipation: Secondary | ICD-10-CM | POA: Diagnosis not present

## 2022-11-08 DIAGNOSIS — Z0471 Encounter for examination and observation following alleged adult physical abuse: Secondary | ICD-10-CM | POA: Diagnosis not present

## 2022-11-08 DIAGNOSIS — M24542 Contracture, left hand: Secondary | ICD-10-CM | POA: Diagnosis not present

## 2022-11-08 DIAGNOSIS — G8114 Spastic hemiplegia affecting left nondominant side: Secondary | ICD-10-CM | POA: Diagnosis not present

## 2022-11-08 DIAGNOSIS — R419 Unspecified symptoms and signs involving cognitive functions and awareness: Secondary | ICD-10-CM | POA: Diagnosis not present

## 2022-11-08 DIAGNOSIS — H04129 Dry eye syndrome of unspecified lacrimal gland: Secondary | ICD-10-CM | POA: Diagnosis not present

## 2022-11-09 DIAGNOSIS — G8114 Spastic hemiplegia affecting left nondominant side: Secondary | ICD-10-CM | POA: Diagnosis not present

## 2022-11-09 DIAGNOSIS — M24542 Contracture, left hand: Secondary | ICD-10-CM | POA: Diagnosis not present

## 2022-11-09 DIAGNOSIS — G9340 Encephalopathy, unspecified: Secondary | ICD-10-CM | POA: Diagnosis not present

## 2022-11-09 DIAGNOSIS — R293 Abnormal posture: Secondary | ICD-10-CM | POA: Diagnosis not present

## 2022-11-09 DIAGNOSIS — G936 Cerebral edema: Secondary | ICD-10-CM | POA: Diagnosis not present

## 2022-11-10 DIAGNOSIS — M24542 Contracture, left hand: Secondary | ICD-10-CM | POA: Diagnosis not present

## 2022-11-10 DIAGNOSIS — R293 Abnormal posture: Secondary | ICD-10-CM | POA: Diagnosis not present

## 2022-11-10 DIAGNOSIS — G8114 Spastic hemiplegia affecting left nondominant side: Secondary | ICD-10-CM | POA: Diagnosis not present

## 2022-11-10 DIAGNOSIS — G9340 Encephalopathy, unspecified: Secondary | ICD-10-CM | POA: Diagnosis not present

## 2022-11-10 DIAGNOSIS — G936 Cerebral edema: Secondary | ICD-10-CM | POA: Diagnosis not present

## 2022-11-13 DIAGNOSIS — G936 Cerebral edema: Secondary | ICD-10-CM | POA: Diagnosis not present

## 2022-11-13 DIAGNOSIS — G9340 Encephalopathy, unspecified: Secondary | ICD-10-CM | POA: Diagnosis not present

## 2022-11-13 DIAGNOSIS — M24542 Contracture, left hand: Secondary | ICD-10-CM | POA: Diagnosis not present

## 2022-11-13 DIAGNOSIS — G8114 Spastic hemiplegia affecting left nondominant side: Secondary | ICD-10-CM | POA: Diagnosis not present

## 2022-11-13 DIAGNOSIS — R293 Abnormal posture: Secondary | ICD-10-CM | POA: Diagnosis not present

## 2022-11-14 DIAGNOSIS — R293 Abnormal posture: Secondary | ICD-10-CM | POA: Diagnosis not present

## 2022-11-14 DIAGNOSIS — G8114 Spastic hemiplegia affecting left nondominant side: Secondary | ICD-10-CM | POA: Diagnosis not present

## 2022-11-14 DIAGNOSIS — G936 Cerebral edema: Secondary | ICD-10-CM | POA: Diagnosis not present

## 2022-11-14 DIAGNOSIS — G9340 Encephalopathy, unspecified: Secondary | ICD-10-CM | POA: Diagnosis not present

## 2022-11-14 DIAGNOSIS — M24542 Contracture, left hand: Secondary | ICD-10-CM | POA: Diagnosis not present

## 2022-11-15 DIAGNOSIS — R293 Abnormal posture: Secondary | ICD-10-CM | POA: Diagnosis not present

## 2022-11-15 DIAGNOSIS — M24542 Contracture, left hand: Secondary | ICD-10-CM | POA: Diagnosis not present

## 2022-11-15 DIAGNOSIS — G8114 Spastic hemiplegia affecting left nondominant side: Secondary | ICD-10-CM | POA: Diagnosis not present

## 2022-11-15 DIAGNOSIS — G9340 Encephalopathy, unspecified: Secondary | ICD-10-CM | POA: Diagnosis not present

## 2022-11-15 DIAGNOSIS — G936 Cerebral edema: Secondary | ICD-10-CM | POA: Diagnosis not present

## 2022-11-16 DIAGNOSIS — G936 Cerebral edema: Secondary | ICD-10-CM | POA: Diagnosis not present

## 2022-11-16 DIAGNOSIS — G8114 Spastic hemiplegia affecting left nondominant side: Secondary | ICD-10-CM | POA: Diagnosis not present

## 2022-11-16 DIAGNOSIS — R293 Abnormal posture: Secondary | ICD-10-CM | POA: Diagnosis not present

## 2022-11-16 DIAGNOSIS — G9340 Encephalopathy, unspecified: Secondary | ICD-10-CM | POA: Diagnosis not present

## 2022-11-16 DIAGNOSIS — M24542 Contracture, left hand: Secondary | ICD-10-CM | POA: Diagnosis not present

## 2022-11-17 DIAGNOSIS — R293 Abnormal posture: Secondary | ICD-10-CM | POA: Diagnosis not present

## 2022-11-17 DIAGNOSIS — G936 Cerebral edema: Secondary | ICD-10-CM | POA: Diagnosis not present

## 2022-11-17 DIAGNOSIS — G8114 Spastic hemiplegia affecting left nondominant side: Secondary | ICD-10-CM | POA: Diagnosis not present

## 2022-11-17 DIAGNOSIS — M24542 Contracture, left hand: Secondary | ICD-10-CM | POA: Diagnosis not present

## 2022-11-17 DIAGNOSIS — G9340 Encephalopathy, unspecified: Secondary | ICD-10-CM | POA: Diagnosis not present

## 2022-11-20 DIAGNOSIS — R293 Abnormal posture: Secondary | ICD-10-CM | POA: Diagnosis not present

## 2022-11-20 DIAGNOSIS — G9340 Encephalopathy, unspecified: Secondary | ICD-10-CM | POA: Diagnosis not present

## 2022-11-20 DIAGNOSIS — M24542 Contracture, left hand: Secondary | ICD-10-CM | POA: Diagnosis not present

## 2022-11-20 DIAGNOSIS — G8114 Spastic hemiplegia affecting left nondominant side: Secondary | ICD-10-CM | POA: Diagnosis not present

## 2022-11-20 DIAGNOSIS — G936 Cerebral edema: Secondary | ICD-10-CM | POA: Diagnosis not present

## 2022-11-21 DIAGNOSIS — G8114 Spastic hemiplegia affecting left nondominant side: Secondary | ICD-10-CM | POA: Diagnosis not present

## 2022-11-21 DIAGNOSIS — G936 Cerebral edema: Secondary | ICD-10-CM | POA: Diagnosis not present

## 2022-11-21 DIAGNOSIS — M24542 Contracture, left hand: Secondary | ICD-10-CM | POA: Diagnosis not present

## 2022-11-21 DIAGNOSIS — R293 Abnormal posture: Secondary | ICD-10-CM | POA: Diagnosis not present

## 2022-11-21 DIAGNOSIS — G9340 Encephalopathy, unspecified: Secondary | ICD-10-CM | POA: Diagnosis not present

## 2022-11-22 DIAGNOSIS — R293 Abnormal posture: Secondary | ICD-10-CM | POA: Diagnosis not present

## 2022-11-22 DIAGNOSIS — G9340 Encephalopathy, unspecified: Secondary | ICD-10-CM | POA: Diagnosis not present

## 2022-11-22 DIAGNOSIS — G936 Cerebral edema: Secondary | ICD-10-CM | POA: Diagnosis not present

## 2022-11-22 DIAGNOSIS — F330 Major depressive disorder, recurrent, mild: Secondary | ICD-10-CM | POA: Diagnosis not present

## 2022-11-22 DIAGNOSIS — G8114 Spastic hemiplegia affecting left nondominant side: Secondary | ICD-10-CM | POA: Diagnosis not present

## 2022-11-22 DIAGNOSIS — M24542 Contracture, left hand: Secondary | ICD-10-CM | POA: Diagnosis not present

## 2022-11-23 DIAGNOSIS — M24542 Contracture, left hand: Secondary | ICD-10-CM | POA: Diagnosis not present

## 2022-11-23 DIAGNOSIS — G9340 Encephalopathy, unspecified: Secondary | ICD-10-CM | POA: Diagnosis not present

## 2022-11-23 DIAGNOSIS — R293 Abnormal posture: Secondary | ICD-10-CM | POA: Diagnosis not present

## 2022-11-23 DIAGNOSIS — G8114 Spastic hemiplegia affecting left nondominant side: Secondary | ICD-10-CM | POA: Diagnosis not present

## 2022-11-23 DIAGNOSIS — G936 Cerebral edema: Secondary | ICD-10-CM | POA: Diagnosis not present

## 2022-11-24 DIAGNOSIS — R293 Abnormal posture: Secondary | ICD-10-CM | POA: Diagnosis not present

## 2022-11-24 DIAGNOSIS — M24542 Contracture, left hand: Secondary | ICD-10-CM | POA: Diagnosis not present

## 2022-11-24 DIAGNOSIS — G9340 Encephalopathy, unspecified: Secondary | ICD-10-CM | POA: Diagnosis not present

## 2022-11-24 DIAGNOSIS — D329 Benign neoplasm of meninges, unspecified: Secondary | ICD-10-CM | POA: Diagnosis not present

## 2022-11-24 DIAGNOSIS — G8114 Spastic hemiplegia affecting left nondominant side: Secondary | ICD-10-CM | POA: Diagnosis not present

## 2022-11-24 DIAGNOSIS — R519 Headache, unspecified: Secondary | ICD-10-CM | POA: Diagnosis not present

## 2022-11-24 DIAGNOSIS — G936 Cerebral edema: Secondary | ICD-10-CM | POA: Diagnosis not present

## 2022-11-24 DIAGNOSIS — H04129 Dry eye syndrome of unspecified lacrimal gland: Secondary | ICD-10-CM | POA: Diagnosis not present

## 2022-11-25 ENCOUNTER — Emergency Department (HOSPITAL_COMMUNITY)
Admission: EM | Admit: 2022-11-25 | Discharge: 2022-11-25 | Disposition: A | Discharge status: Skilled Nursing Facility | Payer: Medicare HMO | Attending: Emergency Medicine | Admitting: Emergency Medicine

## 2022-11-25 ENCOUNTER — Other Ambulatory Visit: Payer: Self-pay

## 2022-11-25 ENCOUNTER — Emergency Department (HOSPITAL_COMMUNITY): Payer: Medicare HMO

## 2022-11-25 DIAGNOSIS — R519 Headache, unspecified: Secondary | ICD-10-CM | POA: Insufficient documentation

## 2022-11-25 DIAGNOSIS — R262 Difficulty in walking, not elsewhere classified: Secondary | ICD-10-CM | POA: Diagnosis not present

## 2022-11-25 DIAGNOSIS — G4489 Other headache syndrome: Secondary | ICD-10-CM | POA: Diagnosis not present

## 2022-11-25 DIAGNOSIS — E119 Type 2 diabetes mellitus without complications: Secondary | ICD-10-CM | POA: Diagnosis not present

## 2022-11-25 DIAGNOSIS — Z7401 Bed confinement status: Secondary | ICD-10-CM | POA: Diagnosis not present

## 2022-11-25 DIAGNOSIS — K5900 Constipation, unspecified: Secondary | ICD-10-CM | POA: Insufficient documentation

## 2022-11-25 DIAGNOSIS — R1084 Generalized abdominal pain: Secondary | ICD-10-CM | POA: Diagnosis not present

## 2022-11-25 LAB — BASIC METABOLIC PANEL
Anion gap: 9 (ref 5–15)
BUN: 18 mg/dL (ref 8–23)
CO2: 29 mmol/L (ref 22–32)
Calcium: 8.4 mg/dL — ABNORMAL LOW (ref 8.9–10.3)
Chloride: 105 mmol/L (ref 98–111)
Creatinine, Ser: 0.81 mg/dL (ref 0.61–1.24)
GFR, Estimated: >60 mL/min (ref >60)
Glucose, Bld: 105 mg/dL — ABNORMAL HIGH (ref 70–99)
Potassium: 4.1 mmol/L (ref 3.5–5.1)
Sodium: 143 mmol/L (ref 135–145)

## 2022-11-25 LAB — CBC
HCT: 36.5 % — ABNORMAL LOW (ref 39.0–52.0)
Hemoglobin: 11.4 g/dL — ABNORMAL LOW (ref 13.0–17.0)
MCH: 28.1 pg (ref 26.0–34.0)
MCHC: 31.2 g/dL (ref 30.0–36.0)
MCV: 89.9 fL (ref 80.0–100.0)
Platelets: 221 10*3/uL (ref 150–400)
RBC: 4.06 MIL/uL — ABNORMAL LOW (ref 4.22–5.81)
RDW: 15.5 % (ref 11.5–15.5)
WBC: 4.8 10*3/uL (ref 4.0–10.5)
nRBC: 0 % (ref 0.0–0.2)

## 2022-11-25 LAB — URINALYSIS, ROUTINE W REFLEX MICROSCOPIC
Bilirubin Urine: NEGATIVE
Glucose, UA: NEGATIVE mg/dL
Hgb urine dipstick: NEGATIVE
Ketones, ur: NEGATIVE mg/dL
Leukocytes,Ua: NEGATIVE
Nitrite: NEGATIVE
Protein, ur: NEGATIVE mg/dL
Specific Gravity, Urine: 1.018 (ref 1.005–1.030)
pH: 6 (ref 5.0–8.0)

## 2022-11-25 MED ORDER — DIPHENHYDRAMINE HCL 50 MG/ML IJ SOLN
12.5000 mg | Freq: Once | INTRAMUSCULAR | Status: DC
  Filled 2022-11-25: qty 1

## 2022-11-25 MED ORDER — POLYETHYLENE GLYCOL 3350 17 G PO PACK
17.0000 g | PACK | Freq: Every day | ORAL | Status: DC
  Administered 2022-11-25: 17 g via ORAL
  Filled 2022-11-25: qty 1

## 2022-11-25 MED ORDER — SODIUM CHLORIDE 0.9 % IV BOLUS
1000.0000 mL | Freq: Once | INTRAVENOUS | Status: AC
  Administered 2022-11-25: 1000 mL via INTRAVENOUS

## 2022-11-25 MED ORDER — SORBITOL 70 % SOLN
960.0000 mL | TOPICAL_OIL | Freq: Once | ORAL | Status: AC
  Administered 2022-11-25: 960 mL via RECTAL
  Filled 2022-11-25: qty 240

## 2022-11-25 MED ORDER — SODIUM CHLORIDE 0.9 % IV SOLN: INTRAVENOUS | Status: DC

## 2022-11-25 MED ORDER — KETOROLAC TROMETHAMINE 30 MG/ML IJ SOLN
30.0000 mg | Freq: Once | INTRAMUSCULAR | Status: DC
  Filled 2022-11-25: qty 1

## 2022-11-25 MED ORDER — METOCLOPRAMIDE HCL 5 MG/ML IJ SOLN
10.0000 mg | Freq: Once | INTRAMUSCULAR | Status: DC
  Filled 2022-11-25: qty 2

## 2022-11-25 MED ORDER — ACETAMINOPHEN 325 MG PO TABS
650.0000 mg | ORAL_TABLET | Freq: Once | ORAL | Status: AC
  Administered 2022-11-25: 650 mg via ORAL
  Filled 2022-11-25: qty 2

## 2022-11-25 MED ORDER — FLEET ENEMA 7-19 GM/118ML RE ENEM
1.0000 | ENEMA | Freq: Once | RECTAL | Status: AC
  Administered 2022-11-25: 1 via RECTAL
  Filled 2022-11-25: qty 1

## 2022-11-25 MED ORDER — POLYETHYLENE GLYCOL 3350 17 G PO PACK: 17.0000 g | PACK | Freq: Every day | ORAL | 0 refills | Status: AC | PRN

## 2022-11-25 NOTE — ED Notes (Signed)
[facility] called. Pt placed on transpo list

## 2022-11-25 NOTE — ED Notes (Signed)
Pt states their head does not hurt anymore and would like to hold off on migraine medications.

## 2022-11-25 NOTE — ED Notes (Signed)
Pt continues to call out constantly for very minor things. Tech explained again to pt that we have other pts we are tending to and can't be in his room every 5 mins. Pt stated to tech that she needed to send someone in there that can keep an eye on him and not leave.

## 2022-11-25 NOTE — ED Triage Notes (Signed)
Pt BIBA from [facility]. Pt c/o HA and abd px from constipation. Has not had BM in 1x week.   L side hemiplegia  Currently bedbound  Aox4  BP: 108/92 HR: 62 SPO2: 94 RA

## 2022-11-25 NOTE — ED Notes (Signed)
Pt has called out multiple times for very minor reasons (ie- pillow needs to be adjusted an inch). Explained to pt that the team can not come into their room every 5 minutes as this takes away from our ability to care for other pts. Pt voiced understanding. Pt called out 3 minutes later for their pillow to be adjusted.

## 2022-11-25 NOTE — ED Notes (Signed)
Pt was noted to continously press his call bell. This medic went in to assist the NT that was assisting the pt. He c/o his pillow needing to be adjusted as "one side is not equal to the other side". I asked how he wanted it to be adjusted and he stated, "hold on, let me measure it". Pt pillow adjusted, and it was explained to him, again, that staff could not be called every minute, he understood and this medic left the room. 2 minutes later pt called out again to inform me that his pillow had "fallen off the bed just like I told you it would". Pt said he only wanted to let me know about the pillow, and he did not want anything done at this time. Pt c/o about his lights this time and wanted them dimmed, and turned on again. I explained to him, again, that we had other patients in the department and we could not be in the room for non critical things. He again verbalized understanding.

## 2022-11-25 NOTE — ED Provider Notes (Signed)
[place] [facility] AT [facility] Provider Note   CSN: [id number] Arrival date & time: 11/25/22  1309     History  Chief Complaint  Patient presents with   Headache   Abdominal Pain    Patrick Buchanan is a 72 y.o. male.  Pt is a 72 yo male with pmhx significant for depression, allergies, gerd, dm2, cerebral aneurysms, and left hemiparesis s/p meningioma resection in 02/2022.  Pt came to the ED today because of headache and constipation.  Pt said he's not had a decent bowel movement in a week.  He has passed small little pebble sized bowel movements.  Pt said the facility is only giving him some MOM, although he's prescribed dulcolax.  Pt said h/a is on right and left side.  No new neuro findings.       Home Medications Prior to Admission medications   Medication Sig Start Date End Date Taking? Authorizing Provider  acetaminophen (TYLENOL) 500 MG tablet Take 1,000 mg by mouth 2 (two) times daily.    [provider]  ALPRAZolam Duanne Moron) 0.5 MG tablet Take 1 tablet (0.5 mg total) by mouth 2 (two) times daily as needed for anxiety. 05/13/22   Thurnell Lose, MD  ARIPiprazole (ABILIFY) 10 MG tablet Take 10 mg by mouth daily. 06/09/22   [provider]  baclofen (LIORESAL) 10 MG tablet Take 1 tablet (10 mg total) by mouth 3 (three) times daily. 04/24/22   Ronnell Freshwater, NP  bisacodyl (DULCOLAX) 10 MG suppository Place 1 suppository (10 mg total) rectally daily as needed for moderate constipation. 05/13/22   Thurnell Lose, MD  cephALEXin (KEFLEX) 500 MG capsule Take 1 capsule (500 mg total) by mouth 3 (three) times daily. 09/02/22   Larene Pickett, PA-C  Cholecalciferol (VITAMIN D3 PO) Take 1 capsule by mouth daily.    [provider]  diclofenac Sodium (VOLTAREN) 1 % GEL Apply 2 g topically 3 (three) times daily. 03/30/22   Setzer, [address], PA-C  escitalopram (LEXAPRO) 20 MG tablet Take 1 tablet (20 mg total) by mouth daily.  04/24/22   Ronnell Freshwater, NP  gabapentin (NEURONTIN) 100 MG capsule Take 1 capsule (100 mg total) by mouth 2 (two) times daily. 04/24/22   Ronnell Freshwater, NP  gabapentin (NEURONTIN) 300 MG capsule Take 300 mg by mouth daily. 05/16/22   [provider]  gabapentin (NEURONTIN) 300 MG capsule Take 1 capsule (300 mg total) by mouth 3 (three) times daily for 5 days. 09/22/22 09/27/22  Wyvonnia Dusky, MD  HYDROcodone-acetaminophen (NORCO/VICODIN) 5-325 MG tablet Take by mouth. Patient not taking: Reported on 06/17/2022 05/17/22   [provider]  ketoconazole (NIZORAL) 2 % cream Apply 1 Application topically daily. 06/14/22   [provider]  ketoconazole (NIZORAL) 2 % shampoo Apply 1 Application topically. Every 20 days 06/14/22   [provider]  lacosamide 100 MG TABS Take 1 tablet (100 mg total) by mouth 2 (two) times daily. 05/13/22   Thurnell Lose, MD  lamoTRIgine (LAMICTAL) 100 MG tablet Take 1 tablet (100 mg total) by mouth 2 (two) times daily. 04/24/22   Ronnell Freshwater, NP  linaclotide (LINZESS) 145 MCG CAPS capsule Take 1 capsule (145 mcg total) by mouth daily before breakfast. 04/24/22   Ronnell Freshwater, NP  magnesium gluconate (MAGONATE) 500 MG tablet Take 0.5 tablets (250 mg total) by mouth at bedtime. Patient taking differently: Take 250 mg by mouth every other day.  04/24/22   Ronnell Freshwater, NP  magnesium hydroxide (MILK OF MAGNESIA) 400 MG/5ML suspension Take 30 mLs by mouth daily as needed for mild constipation.    [provider]  Multiple Vitamin (MULTIVITAMIN) tablet Take 1 tablet by mouth daily.    [provider]  pantoprazole (PROTONIX) 40 MG tablet Take 1 tablet (40 mg total) by mouth at bedtime. 04/24/22   Ronnell Freshwater, NP  polyethylene glycol (MIRALAX) 17 g packet Take 17 g by mouth daily as needed for moderate constipation. 11/25/22 12/25/22  Isla Pence, MD  polyvinyl alcohol (LIQUIFILM TEARS) 1.4 % ophthalmic  solution Place 1 drop into both eyes as needed for dry eyes.    [provider]  QUEtiapine (SEROQUEL) 50 MG tablet Take 1 tablet (50 mg total) by mouth at bedtime. 05/13/22   Thurnell Lose, MD  rosuvastatin (CRESTOR) 20 MG tablet Take 1 tablet (20 mg total) by mouth daily. 04/24/22   Ronnell Freshwater, NP  senna-docusate (SENOKOT-S) 8.6-50 MG tablet Take 2 tablets by mouth at bedtime. 03/30/22   Setzer, [address], PA-C  tamsulosin (FLOMAX) 0.4 MG CAPS capsule Take 1 capsule (0.4 mg total) by mouth daily after supper. 05/13/22   Thurnell Lose, MD  traZODone (DESYREL) 50 MG tablet Take 0.5-1 tablets (25-50 mg total) by mouth at bedtime as needed for sleep. 04/24/22   Ronnell Freshwater, NP  Vitamin D, Ergocalciferol, (DRISDOL) 1.25 MG (50000 UNIT) CAPS capsule Take 1 capsule (50,000 Units total) by mouth every Sunday. Take one tablet wkly 04/30/22   Ronnell Freshwater, NP      Allergies    Other    Review of Systems   Review of Systems  Gastrointestinal:  Positive for constipation.  Neurological:  Positive for headaches.  All other systems reviewed and are negative.   Physical Exam Updated Vital Signs BP 124/68   Pulse 66   Temp 98.1 F (36.7 C)   Resp 14   SpO2 100%  Physical Exam Vitals and nursing note reviewed.  Constitutional:      Appearance: He is well-developed.  HENT:     Head: Normocephalic and atraumatic.     Mouth/Throat:     Mouth: Mucous membranes are moist.     Pharynx: Oropharynx is clear.  Eyes:     Extraocular Movements: Extraocular movements intact.     Pupils: Pupils are equal, round, and reactive to light.  Cardiovascular:     Rate and Rhythm: Normal rate and regular rhythm.     Heart sounds: Normal heart sounds.  Pulmonary:     Effort: Pulmonary effort is normal.     Breath sounds: Normal breath sounds.  Abdominal:     General: Bowel sounds are decreased.     Palpations: Abdomen is soft.  Musculoskeletal:        General: Normal range of  motion.     Cervical back: Normal range of motion and neck supple.  Skin:    General: Skin is warm and dry.  Neurological:     Mental Status: He is alert and oriented to person, place, and time.     Comments: Left arm and leg hemiplegia (chronic)  Psychiatric:        Mood and Affect: Mood normal.        Speech: Speech normal.        Behavior: Behavior normal.     ED Results / Procedures / Treatments   Labs (all labs ordered are listed, but  only abnormal results are displayed) Labs Reviewed  BASIC METABOLIC PANEL - Abnormal; Notable for the following components:      Result Value   Glucose, Bld 105 (*)    Calcium 8.4 (*)    All other components within normal limits  CBC - Abnormal; Notable for the following components:   RBC 4.06 (*)    Hemoglobin 11.4 (*)    HCT 36.5 (*)    All other components within normal limits  URINALYSIS, ROUTINE W REFLEX MICROSCOPIC - Abnormal; Notable for the following components:   APPearance HAZY (*)    All other components within normal limits    EKG None  Radiology CT Head Wo Contrast  Result Date: 11/25/2022 CLINICAL DATA:  Headache EXAM: CT HEAD WITHOUT CONTRAST TECHNIQUE: Contiguous axial images were obtained from the base of the skull through the vertex without intravenous contrast. RADIATION DOSE REDUCTION: This exam was performed according to the departmental dose-optimization program which includes automated exposure control, adjustment of the mA and/or kV according to patient size and/or use of iterative reconstruction technique. COMPARISON:  CT head 09/23/2019 FINDINGS: Brain: Postsurgical changes from right pterional craniotomy for aneurysm clipping. There is encephalomalacia in this region, favored to be postsurgical. There also postsurgical changes from a right parietal craniotomy. Redemonstrated are multiple partially calcified extra-axial lesions, favored to represent multiple intracranial meningiomas. The most notable of these include  a parafalcine meningioma in the right frontal lobe 2 0.5 x 1.2 cm in the greatest axial dimension with surrounding vasogenic edema. Large meningioma along the left temporal convexity with surrounding vasogenic edema (series 2, image 15), with a unchanged in size compared to prior exam. There is also an unchanged meningioma in the left cranial fossa measuring up to 4.3 x 3.2 cm, unchanged from prior. No significant change in 5 mm rightward midline shift. Unchanged hyperdense dense lesion within the sella. Vascular: Posterior right pterional craniotomy for aneurysm. Skull: Right pterional and right parietal craniotomies. Sinuses/Orbits: No middle ear or mastoid effusion. Paranasal sinuses are clear. Orbits are unremarkable. Other: None IMPRESSION: 1. No acute intracranial abnormality. 2. Multiple partially calcified extra-axial lesions, favored to represent multiple intracranial meningiomas, unchanged from prior. There is vasogenic edema surrounding the lesions along the falx and left temporal convexity, unchanged. Electronically Signed   By: Marin Roberts M.D.   On: 11/25/2022 14:55    Procedures Procedures    Medications Ordered in ED Medications  sodium chloride 0.9 % bolus 1,000 mL (0 mLs Intravenous Stopped 11/25/22 1520)    And  0.9 %  sodium chloride infusion (has no administration in time range)  metoCLOPramide (REGLAN) injection 10 mg (10 mg Intravenous Patient Refused/Not Given 11/25/22 1558)  diphenhydrAMINE (BENADRYL) injection 12.5 mg (12.5 mg Intravenous Patient Refused/Not Given 11/25/22 1558)  ketorolac (TORADOL) 30 MG/ML injection 30 mg (30 mg Intravenous Patient Refused/Not Given 11/25/22 1558)  polyethylene glycol (MIRALAX / GLYCOLAX) packet 17 g (17 g Oral Given 11/25/22 1638)  sodium phosphate (FLEET) 7-19 GM/118ML enema 1 enema (1 enema Rectal Given 11/25/22 1404)  sorbitol, milk of mag, mineral oil, glycerin (SMOG) enema (960 mLs Rectal Given 11/25/22 1558)  acetaminophen (TYLENOL) tablet 650  mg (650 mg Oral Given 11/25/22 1638)    ED Course/ Medical Decision Making/ A&P                             Medical Decision Making Amount and/or Complexity of Data Reviewed Labs: ordered. Radiology: ordered.  Risk OTC drugs. Prescription drug management.   This patient presents to the ED for concern of abd pain and headache, this involves an extensive number of treatment options, and is a complaint that carries with it a high risk of complications and morbidity.  The differential diagnosis includes head injury, constipation, infection   Co morbidities that complicate the patient evaluation  depression, allergies, gerd, dm2, cerebral aneurysms, and left hemiparesis s/p meningioma resection   Additional history obtained:  Additional history obtained from epic chart review External records from outside source obtained and reviewed including EMS report   Lab Tests:  I Ordered, and personally interpreted labs.  The pertinent results include:  cbc with hgb 11.4 (chronic), bmp nl; ua nl   Imaging Studies ordered:  I ordered imaging studies including ct head  I independently visualized and interpreted imaging which showed   No acute intracranial abnormality.  2. Multiple partially calcified extra-axial lesions, favored to  represent multiple intracranial meningiomas, unchanged from prior.  There is vasogenic edema surrounding the lesions along the falx and  left temporal convexity, unchanged.   I agree with the radiologist interpretation   Cardiac Monitoring:  The patient was maintained on a cardiac monitor.  I personally viewed and interpreted the cardiac monitored which showed an underlying rhythm of: nsr   Medicines ordered and prescription drug management:  I ordered medication including fleet enema  for constipation  Reevaluation of the patient after these medicines showed that the patient improved I have reviewed the patients home medicines and have made  adjustments as needed   Test Considered:  ct   Critical Interventions:  Pain control/constipation relief   Problem List / ED Course:  Headache:  CT without anything acute.  HA is better. Constipation:  pt had a large bm with enemas.  I will put pt on miralax.     Reevaluation:  After the interventions noted above, I reevaluated the patient and found that they have :improved   Social Determinants of Health:  Lives in SNF   Dispostion:  After consideration of the diagnostic results and the patients response to treatment, I feel that the patent would benefit from discharge with outpatient f/u.          Final Clinical Impression(s) / ED Diagnoses Final diagnoses:  Constipation, unspecified constipation type  Acute nonintractable headache, unspecified headache type    Rx / DC Orders ED Discharge Orders          Ordered    polyethylene glycol (MIRALAX) 17 g packet  Daily PRN        11/25/22 1650              Isla Pence, MD 11/25/22 1820

## 2022-11-25 NOTE — ED Notes (Signed)
[facility]  was called for report/ a tech answered and told this nurse that the nurse of pt cannot come to the phone but is aware of pt condition and is expecting pt to arrive via [facility].

## 2022-11-27 DIAGNOSIS — M24542 Contracture, left hand: Secondary | ICD-10-CM | POA: Diagnosis not present

## 2022-11-27 DIAGNOSIS — R519 Headache, unspecified: Secondary | ICD-10-CM | POA: Diagnosis not present

## 2022-11-27 DIAGNOSIS — D329 Benign neoplasm of meninges, unspecified: Secondary | ICD-10-CM | POA: Diagnosis not present

## 2022-11-27 DIAGNOSIS — G9340 Encephalopathy, unspecified: Secondary | ICD-10-CM | POA: Diagnosis not present

## 2022-11-27 DIAGNOSIS — R293 Abnormal posture: Secondary | ICD-10-CM | POA: Diagnosis not present

## 2022-11-27 DIAGNOSIS — G936 Cerebral edema: Secondary | ICD-10-CM | POA: Diagnosis not present

## 2022-11-27 DIAGNOSIS — K5901 Slow transit constipation: Secondary | ICD-10-CM | POA: Diagnosis not present

## 2022-11-27 DIAGNOSIS — G8114 Spastic hemiplegia affecting left nondominant side: Secondary | ICD-10-CM | POA: Diagnosis not present

## 2022-11-28 DIAGNOSIS — G8114 Spastic hemiplegia affecting left nondominant side: Secondary | ICD-10-CM | POA: Diagnosis not present

## 2022-11-28 DIAGNOSIS — G9340 Encephalopathy, unspecified: Secondary | ICD-10-CM | POA: Diagnosis not present

## 2022-11-28 DIAGNOSIS — M24542 Contracture, left hand: Secondary | ICD-10-CM | POA: Diagnosis not present

## 2022-11-28 DIAGNOSIS — R293 Abnormal posture: Secondary | ICD-10-CM | POA: Diagnosis not present

## 2022-11-28 DIAGNOSIS — G936 Cerebral edema: Secondary | ICD-10-CM | POA: Diagnosis not present

## 2022-11-29 DIAGNOSIS — R293 Abnormal posture: Secondary | ICD-10-CM | POA: Diagnosis not present

## 2022-11-29 DIAGNOSIS — G9340 Encephalopathy, unspecified: Secondary | ICD-10-CM | POA: Diagnosis not present

## 2022-11-29 DIAGNOSIS — G936 Cerebral edema: Secondary | ICD-10-CM | POA: Diagnosis not present

## 2022-11-29 DIAGNOSIS — G8114 Spastic hemiplegia affecting left nondominant side: Secondary | ICD-10-CM | POA: Diagnosis not present

## 2022-11-29 DIAGNOSIS — M24542 Contracture, left hand: Secondary | ICD-10-CM | POA: Diagnosis not present

## 2022-11-30 DIAGNOSIS — G8114 Spastic hemiplegia affecting left nondominant side: Secondary | ICD-10-CM | POA: Diagnosis not present

## 2022-11-30 DIAGNOSIS — R293 Abnormal posture: Secondary | ICD-10-CM | POA: Diagnosis not present

## 2022-11-30 DIAGNOSIS — G936 Cerebral edema: Secondary | ICD-10-CM | POA: Diagnosis not present

## 2022-11-30 DIAGNOSIS — M24542 Contracture, left hand: Secondary | ICD-10-CM | POA: Diagnosis not present

## 2022-11-30 DIAGNOSIS — G9340 Encephalopathy, unspecified: Secondary | ICD-10-CM | POA: Diagnosis not present

## 2022-12-01 DIAGNOSIS — M24542 Contracture, left hand: Secondary | ICD-10-CM | POA: Diagnosis not present

## 2022-12-01 DIAGNOSIS — G9340 Encephalopathy, unspecified: Secondary | ICD-10-CM | POA: Diagnosis not present

## 2022-12-01 DIAGNOSIS — G8114 Spastic hemiplegia affecting left nondominant side: Secondary | ICD-10-CM | POA: Diagnosis not present

## 2022-12-01 DIAGNOSIS — R293 Abnormal posture: Secondary | ICD-10-CM | POA: Diagnosis not present

## 2022-12-01 DIAGNOSIS — G936 Cerebral edema: Secondary | ICD-10-CM | POA: Diagnosis not present

## 2022-12-04 DIAGNOSIS — M24542 Contracture, left hand: Secondary | ICD-10-CM | POA: Diagnosis not present

## 2022-12-04 DIAGNOSIS — G9340 Encephalopathy, unspecified: Secondary | ICD-10-CM | POA: Diagnosis not present

## 2022-12-04 DIAGNOSIS — G936 Cerebral edema: Secondary | ICD-10-CM | POA: Diagnosis not present

## 2022-12-04 DIAGNOSIS — G8114 Spastic hemiplegia affecting left nondominant side: Secondary | ICD-10-CM | POA: Diagnosis not present

## 2022-12-04 DIAGNOSIS — R293 Abnormal posture: Secondary | ICD-10-CM | POA: Diagnosis not present

## 2022-12-05 DIAGNOSIS — M24542 Contracture, left hand: Secondary | ICD-10-CM | POA: Diagnosis not present

## 2022-12-05 DIAGNOSIS — G8114 Spastic hemiplegia affecting left nondominant side: Secondary | ICD-10-CM | POA: Diagnosis not present

## 2022-12-05 DIAGNOSIS — R293 Abnormal posture: Secondary | ICD-10-CM | POA: Diagnosis not present

## 2022-12-05 DIAGNOSIS — G9340 Encephalopathy, unspecified: Secondary | ICD-10-CM | POA: Diagnosis not present

## 2022-12-05 DIAGNOSIS — G936 Cerebral edema: Secondary | ICD-10-CM | POA: Diagnosis not present

## 2022-12-06 DIAGNOSIS — G9340 Encephalopathy, unspecified: Secondary | ICD-10-CM | POA: Diagnosis not present

## 2022-12-06 DIAGNOSIS — R293 Abnormal posture: Secondary | ICD-10-CM | POA: Diagnosis not present

## 2022-12-06 DIAGNOSIS — M24542 Contracture, left hand: Secondary | ICD-10-CM | POA: Diagnosis not present

## 2022-12-06 DIAGNOSIS — G8114 Spastic hemiplegia affecting left nondominant side: Secondary | ICD-10-CM | POA: Diagnosis not present

## 2022-12-06 DIAGNOSIS — G936 Cerebral edema: Secondary | ICD-10-CM | POA: Diagnosis not present

## 2022-12-07 DIAGNOSIS — R293 Abnormal posture: Secondary | ICD-10-CM | POA: Diagnosis not present

## 2022-12-07 DIAGNOSIS — G8114 Spastic hemiplegia affecting left nondominant side: Secondary | ICD-10-CM | POA: Diagnosis not present

## 2022-12-07 DIAGNOSIS — G9340 Encephalopathy, unspecified: Secondary | ICD-10-CM | POA: Diagnosis not present

## 2022-12-07 DIAGNOSIS — G936 Cerebral edema: Secondary | ICD-10-CM | POA: Diagnosis not present

## 2022-12-07 DIAGNOSIS — M24542 Contracture, left hand: Secondary | ICD-10-CM | POA: Diagnosis not present

## 2022-12-08 DIAGNOSIS — M24542 Contracture, left hand: Secondary | ICD-10-CM | POA: Diagnosis not present

## 2022-12-08 DIAGNOSIS — G936 Cerebral edema: Secondary | ICD-10-CM | POA: Diagnosis not present

## 2022-12-08 DIAGNOSIS — G8114 Spastic hemiplegia affecting left nondominant side: Secondary | ICD-10-CM | POA: Diagnosis not present

## 2022-12-08 DIAGNOSIS — R293 Abnormal posture: Secondary | ICD-10-CM | POA: Diagnosis not present

## 2022-12-08 DIAGNOSIS — G9340 Encephalopathy, unspecified: Secondary | ICD-10-CM | POA: Diagnosis not present

## 2022-12-11 DIAGNOSIS — F411 Generalized anxiety disorder: Secondary | ICD-10-CM | POA: Diagnosis not present

## 2022-12-11 DIAGNOSIS — G8114 Spastic hemiplegia affecting left nondominant side: Secondary | ICD-10-CM | POA: Diagnosis not present

## 2022-12-11 DIAGNOSIS — D329 Benign neoplasm of meninges, unspecified: Secondary | ICD-10-CM | POA: Diagnosis not present

## 2022-12-11 DIAGNOSIS — M24542 Contracture, left hand: Secondary | ICD-10-CM | POA: Diagnosis not present

## 2022-12-11 DIAGNOSIS — G936 Cerebral edema: Secondary | ICD-10-CM | POA: Diagnosis not present

## 2022-12-11 DIAGNOSIS — R419 Unspecified symptoms and signs involving cognitive functions and awareness: Secondary | ICD-10-CM | POA: Diagnosis not present

## 2022-12-11 DIAGNOSIS — G40909 Epilepsy, unspecified, not intractable, without status epilepticus: Secondary | ICD-10-CM | POA: Diagnosis not present

## 2022-12-11 DIAGNOSIS — K5909 Other constipation: Secondary | ICD-10-CM | POA: Diagnosis not present

## 2022-12-11 DIAGNOSIS — E119 Type 2 diabetes mellitus without complications: Secondary | ICD-10-CM | POA: Diagnosis not present

## 2022-12-11 DIAGNOSIS — R293 Abnormal posture: Secondary | ICD-10-CM | POA: Diagnosis not present

## 2022-12-11 DIAGNOSIS — G9340 Encephalopathy, unspecified: Secondary | ICD-10-CM | POA: Diagnosis not present

## 2022-12-13 DIAGNOSIS — R293 Abnormal posture: Secondary | ICD-10-CM | POA: Diagnosis not present

## 2022-12-13 DIAGNOSIS — G8114 Spastic hemiplegia affecting left nondominant side: Secondary | ICD-10-CM | POA: Diagnosis not present

## 2022-12-13 DIAGNOSIS — G9340 Encephalopathy, unspecified: Secondary | ICD-10-CM | POA: Diagnosis not present

## 2022-12-13 DIAGNOSIS — M24542 Contracture, left hand: Secondary | ICD-10-CM | POA: Diagnosis not present

## 2022-12-13 DIAGNOSIS — G936 Cerebral edema: Secondary | ICD-10-CM | POA: Diagnosis not present

## 2022-12-14 DIAGNOSIS — G936 Cerebral edema: Secondary | ICD-10-CM | POA: Diagnosis not present

## 2022-12-14 DIAGNOSIS — G9340 Encephalopathy, unspecified: Secondary | ICD-10-CM | POA: Diagnosis not present

## 2022-12-14 DIAGNOSIS — G8114 Spastic hemiplegia affecting left nondominant side: Secondary | ICD-10-CM | POA: Diagnosis not present

## 2022-12-14 DIAGNOSIS — M24542 Contracture, left hand: Secondary | ICD-10-CM | POA: Diagnosis not present

## 2022-12-14 DIAGNOSIS — R293 Abnormal posture: Secondary | ICD-10-CM | POA: Diagnosis not present

## 2022-12-19 DIAGNOSIS — R293 Abnormal posture: Secondary | ICD-10-CM | POA: Diagnosis not present

## 2022-12-19 DIAGNOSIS — M24542 Contracture, left hand: Secondary | ICD-10-CM | POA: Diagnosis not present

## 2022-12-19 DIAGNOSIS — G8114 Spastic hemiplegia affecting left nondominant side: Secondary | ICD-10-CM | POA: Diagnosis not present

## 2022-12-19 DIAGNOSIS — G9340 Encephalopathy, unspecified: Secondary | ICD-10-CM | POA: Diagnosis not present

## 2022-12-19 DIAGNOSIS — G936 Cerebral edema: Secondary | ICD-10-CM | POA: Diagnosis not present

## 2022-12-20 DIAGNOSIS — Z0189 Encounter for other specified special examinations: Secondary | ICD-10-CM | POA: Diagnosis not present

## 2022-12-20 DIAGNOSIS — G8194 Hemiplegia, unspecified affecting left nondominant side: Secondary | ICD-10-CM | POA: Diagnosis not present

## 2022-12-20 DIAGNOSIS — R519 Headache, unspecified: Secondary | ICD-10-CM | POA: Diagnosis not present

## 2022-12-20 DIAGNOSIS — M24542 Contracture, left hand: Secondary | ICD-10-CM | POA: Diagnosis not present

## 2022-12-20 DIAGNOSIS — G9340 Encephalopathy, unspecified: Secondary | ICD-10-CM | POA: Diagnosis not present

## 2022-12-20 DIAGNOSIS — R293 Abnormal posture: Secondary | ICD-10-CM | POA: Diagnosis not present

## 2022-12-20 DIAGNOSIS — D329 Benign neoplasm of meninges, unspecified: Secondary | ICD-10-CM | POA: Diagnosis not present

## 2022-12-20 DIAGNOSIS — G936 Cerebral edema: Secondary | ICD-10-CM | POA: Diagnosis not present

## 2022-12-20 DIAGNOSIS — K5901 Slow transit constipation: Secondary | ICD-10-CM | POA: Diagnosis not present

## 2022-12-20 DIAGNOSIS — G8114 Spastic hemiplegia affecting left nondominant side: Secondary | ICD-10-CM | POA: Diagnosis not present

## 2022-12-21 DIAGNOSIS — R293 Abnormal posture: Secondary | ICD-10-CM | POA: Diagnosis not present

## 2022-12-21 DIAGNOSIS — M24542 Contracture, left hand: Secondary | ICD-10-CM | POA: Diagnosis not present

## 2022-12-21 DIAGNOSIS — G8114 Spastic hemiplegia affecting left nondominant side: Secondary | ICD-10-CM | POA: Diagnosis not present

## 2022-12-21 DIAGNOSIS — G9340 Encephalopathy, unspecified: Secondary | ICD-10-CM | POA: Diagnosis not present

## 2022-12-21 DIAGNOSIS — G936 Cerebral edema: Secondary | ICD-10-CM | POA: Diagnosis not present

## 2022-12-25 DIAGNOSIS — G936 Cerebral edema: Secondary | ICD-10-CM | POA: Diagnosis not present

## 2022-12-25 DIAGNOSIS — G8114 Spastic hemiplegia affecting left nondominant side: Secondary | ICD-10-CM | POA: Diagnosis not present

## 2022-12-26 DIAGNOSIS — G936 Cerebral edema: Secondary | ICD-10-CM | POA: Diagnosis not present

## 2022-12-26 DIAGNOSIS — G8114 Spastic hemiplegia affecting left nondominant side: Secondary | ICD-10-CM | POA: Diagnosis not present

## 2022-12-27 ENCOUNTER — Telehealth: Payer: Self-pay | Admitting: Neurology

## 2022-12-27 NOTE — Telephone Encounter (Signed)
Spoke with pt daughter to confirm appt. Pt's daughter states she does not know yet if she will be able to attend the appt with her father. States she needs to speak with nurses/Doctors about pt symptoms and behavior and if she can not make it to the appt she would like a call to discuss.

## 2022-12-28 DIAGNOSIS — G936 Cerebral edema: Secondary | ICD-10-CM | POA: Diagnosis not present

## 2022-12-28 DIAGNOSIS — G8114 Spastic hemiplegia affecting left nondominant side: Secondary | ICD-10-CM | POA: Diagnosis not present

## 2022-12-28 NOTE — Telephone Encounter (Signed)
Pt daughter stated she will not be able to make appointment. States she really needs to talk to nurse, states pt is confused and talking out his head. Stated this is interfering with his care because he is refusing pain medication  and other care.

## 2022-12-31 DIAGNOSIS — G936 Cerebral edema: Secondary | ICD-10-CM | POA: Diagnosis not present

## 2022-12-31 DIAGNOSIS — G8114 Spastic hemiplegia affecting left nondominant side: Secondary | ICD-10-CM | POA: Diagnosis not present

## 2023-01-01 DIAGNOSIS — G936 Cerebral edema: Secondary | ICD-10-CM | POA: Diagnosis not present

## 2023-01-01 DIAGNOSIS — G8114 Spastic hemiplegia affecting left nondominant side: Secondary | ICD-10-CM | POA: Diagnosis not present

## 2023-01-01 NOTE — Telephone Encounter (Signed)
Called and spoke to daughter [name] (dpr) She wanted to inform provider prior to appointment, that patient has been getting worse with his symptoms, stating he has some good days and some bad. On the bad, he gets hyperfixated on things and seems confused, and acts drunk or drugged. Daughter states he will think he has to go to work, or he is in [employer] or has somewhere to be. Patient is staying in a facility and has never been in the [employer]. Daughter states he will also refuse his meds and say they are trying to kill him, he wants to keep his eyes closed all day even though he is awake and talking and has very severe OCD that was not present before. Daughter cannot make appointment and would like a call following the appointment  because patient will not remember to tell her what happened.

## 2023-01-02 ENCOUNTER — Ambulatory Visit (INDEPENDENT_AMBULATORY_CARE_PROVIDER_SITE_OTHER): Payer: Medicare HMO | Admitting: Neurology

## 2023-01-02 ENCOUNTER — Telehealth: Payer: Self-pay

## 2023-01-02 ENCOUNTER — Telehealth: Payer: Self-pay | Admitting: Neurology

## 2023-01-02 ENCOUNTER — Other Ambulatory Visit: Payer: Self-pay | Admitting: *Deleted

## 2023-01-02 ENCOUNTER — Encounter: Payer: Self-pay | Admitting: Neurology

## 2023-01-02 DIAGNOSIS — D329 Benign neoplasm of meninges, unspecified: Secondary | ICD-10-CM | POA: Diagnosis not present

## 2023-01-02 DIAGNOSIS — G8194 Hemiplegia, unspecified affecting left nondominant side: Secondary | ICD-10-CM

## 2023-01-02 MED ORDER — LAMOTRIGINE 100 MG PO TABS: 100.0000 mg | ORAL_TABLET | Freq: Two times a day (BID) | ORAL | 11 refills | Status: DC

## 2023-01-02 MED ORDER — LACOSAMIDE 100 MG PO TABS: 100.0000 mg | ORAL_TABLET | Freq: Two times a day (BID) | ORAL | 11 refills | Status: DC

## 2023-01-02 NOTE — Progress Notes (Signed)
Chief Complaint  Patient presents with   Follow-up    Rm 14,alone   reports no changes states leg has not improved       ASSESSMENT AND PLAN  Patrick Buchanan is a 72 y.o. 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   MEDICAL HISTORY:  Patrick Buchanan, is a 72 year old male, seen in request by his primary care PA Lorrene Reid, for evaluation of gradual onset of left side difficulty, unsteady gait, initial evaluation was on January 06, 2022.   I reviewed and summarized the referring note. PMHX Depression HLD Cerebral aneurysm surgery in 1992, presented with sudden onset severe headache Cervical sudural benign tumor,in 2005  presented with right arm weakness  He had a history of right brain aneurysm bleeding in 1992, presented with sudden onset severe headaches, status post right craniectomy by Dr. 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 age 39, he also had a history of cervical subdural benign tumor in 2005, presented with right arm weakness, surgical was successful, he denies any focal deficit  He went through very stressful 2022, his wife was diagnosed with pancreatic cancer in summer, and just  passed away in 2021-11-13, during that period of time, he was focused on his wife  Only afterwards, he noticed he has left leg difficulty, unsteady gait, denies left arm weakness, but on examination, he has noticeable spastic left upper and lower extremity weakness,  Over the past few months, he also describes intermittent uncontrollable left leg jerking movements lasting 2 to 3 minutes, mild confusion, but never spreading to left arm  He denies incontinence, denies right-sided involvement  UPDATE January 02 2023: Doing a CT angiogram study through 2023, he was found to have numerous partially calcified extra-axial mass, throughout both hemisphere, largest lesion on the right centered over the right frontal parietal region near the vertex, measuring 4 x 7 x 3 0.5 x 5.4 cm, significant mass effect on the underlying brain parenchyma with surrounding edema at the right frontal parietal lobe,  CT angiogram showed postsurgical change, clipping of right ICA terminus aneurysm with no evidence of recurrent aneurysm  He underwent pan bilateral parietal craniotomy with resection of parasagittal tumor greater than 6 cm in diameter, mesh cranioplasty 3 x 6 cm, by Dr. Earnie Larsson  Surgical. pathology consistent with meningioma, WHO grade 1  Multiple postsurgical repeat CT head showed postsurgical change, continue evidence of homogeneous enhancing lesion likely meningioma involving left middle cranial fossa, 4.5 x 2.6 x 3.0 cm, probably involvement of left cavernous sinus with surrounding low-attenuation, presumably due to vasogenic edema, inferior left parietal occipital region, measuring 3.4 x 2.9 x 4.0 cm, with surrounding edema,  CT of the cervical spine showed status post laminectomy from C3 6 to T1, no acute abnormality  Laboratory  evaluations in 2024, hemoglobin of 11.4, BMP showed calcium 8.4, otherwise normal,  Postsurgically, he was discharged to rehabilitation, then nursing home, profound left leg  weakness, mild left arm weakness, significant left shoulder pain on passive movement,  He also complains of transient episode of confusion, lasting for few minutes, about once a month, suggestive of partial seizure, is already on lamotrigine 100 twice a day, Vimpat 100 mg twice a day, overall tolerating the medication well, also on polypharmacy, gabapentin and Abilify, trazodone,   PHYSICAL EXAM:   Vitals:   01/02/23 1010  BP: 106/70  Pulse: 66     PHYSICAL EXAMNIATION:  Gen: NAD, conversant, well nourised, well groomed                     Cardiovascular: Regular rate rhythm, no peripheral edema, warm, nontender. Eyes: Conjunctivae clear without exudates or hemorrhage Neck: Supple, no carotid bruits. Pulmonary: Clear to auscultation bilaterally   NEUROLOGICAL EXAM:  MENTAL STATUS: Speech:    Speech is normal; fluent and spontaneous with normal comprehension.  Cognition:     Orientation to time, place and person     Normal recent and remote memory     Normal Attention span and concentration     Normal Language, naming, repeating,spontaneous speech     Fund of knowledge   CRANIAL NERVES: CN II: Visual fields are full to confrontation. Pupils are round equal and briskly reactive to light. CN III, IV, VI: extraocular movement are normal. No ptosis. CN V: Facial sensation is intact to light touch CN VII: Face is symmetric with normal eye closure  CN VIII: Hearing is normal to causal conversation. CN IX, X: Phonation is normal. CN XI: Head turning and shoulder shrug are intact  MOTOR: Significant left shoulder pain upon passive movement, no antigravity movement, limited range of motion of left elbow, maximum extension 150 degree, okay to left hand grip, no antigravity movement of left lower extremity  REFLEXES: Brisk left upper and lower extremity reflex  SENSORY: Intact to light touch, pinprick and vibratory sensation are intact in fingers and toes.  COORDINATION: There  is no trunk or limb dysmetria noted.  GAIT/STANCE: Deferred  REVIEW OF SYSTEMS:  Full 14 system review of systems performed and notable only for as above All other review of systems were negative.   ALLERGIES: Allergies  Allergen Reactions   Other Itching and Other (See Comments)    Hay fever- Itchy eyes, runny nose, congestion- no breathing issues, however    HOME MEDICATIONS: Current Outpatient Medications  Medication Sig Dispense Refill   acetaminophen (TYLENOL) 500 MG tablet Take 1,000 mg by mouth 2 (two) times daily.     ALPRAZolam (XANAX) 0.5 MG tablet Take 1 tablet (0.5 mg total) by mouth 2 (two) times daily as needed for anxiety. 5 tablet 0   ARIPiprazole (ABILIFY) 10 MG tablet Take 10 mg by mouth daily.     baclofen (LIORESAL) 10 MG tablet Take 1 tablet (10 mg total) by mouth 3 (three) times daily. 90 each 1   bisacodyl (DULCOLAX) 10 MG suppository Place 1 suppository (10 mg total) rectally daily as needed for moderate constipation. 12 suppository 0   cephALEXin (KEFLEX) 500 MG capsule Take 1 capsule (500 mg total) by mouth 3 (three) times daily. 21 capsule 0   Cholecalciferol (VITAMIN D3 PO) Take 1 capsule by mouth daily.     diclofenac Sodium (VOLTAREN) 1 % GEL Apply 2 g topically 3 (three) times daily. 50 g  0   escitalopram (LEXAPRO) 20 MG tablet Take 1 tablet (20 mg total) by mouth daily. 30 tablet 2   gabapentin (NEURONTIN) 100 MG capsule Take 1 capsule (100 mg total) by mouth 2 (two) times daily. 60 capsule 2   gabapentin (NEURONTIN) 300 MG capsule Take 300 mg by mouth daily.     HYDROcodone-acetaminophen (NORCO/VICODIN) 5-325 MG tablet Take by mouth.     ketoconazole (NIZORAL) 2 % cream Apply 1 Application topically daily.     ketoconazole (NIZORAL) 2 % shampoo Apply 1 Application topically. Every 20 days     lacosamide 100 MG TABS Take 1 tablet (100 mg total) by mouth 2 (two) times daily. 10 tablet 0   lamoTRIgine (LAMICTAL) 100 MG tablet Take 1 tablet (100 mg  total) by mouth 2 (two) times daily. 60 tablet 0   linaclotide (LINZESS) 145 MCG CAPS capsule Take 1 capsule (145 mcg total) by mouth daily before breakfast. 30 capsule 2   magnesium gluconate (MAGONATE) 500 MG tablet Take 0.5 tablets (250 mg total) by mouth at bedtime. (Patient taking differently: Take 250 mg by mouth every other day.) 30 tablet 2   magnesium hydroxide (MILK OF MAGNESIA) 400 MG/5ML suspension Take 30 mLs by mouth daily as needed for mild constipation.     Multiple Vitamin (MULTIVITAMIN) tablet Take 1 tablet by mouth daily.     pantoprazole (PROTONIX) 40 MG tablet Take 1 tablet (40 mg total) by mouth at bedtime. 30 tablet 2   polyvinyl alcohol (LIQUIFILM TEARS) 1.4 % ophthalmic solution Place 1 drop into both eyes as needed for dry eyes.     QUEtiapine (SEROQUEL) 50 MG tablet Take 1 tablet (50 mg total) by mouth at bedtime.     rosuvastatin (CRESTOR) 20 MG tablet Take 1 tablet (20 mg total) by mouth daily. 90 tablet 3   senna-docusate (SENOKOT-S) 8.6-50 MG tablet Take 2 tablets by mouth at bedtime.     tamsulosin (FLOMAX) 0.4 MG CAPS capsule Take 1 capsule (0.4 mg total) by mouth daily after supper. 30 capsule    traZODone (DESYREL) 50 MG tablet Take 0.5-1 tablets (25-50 mg total) by mouth at bedtime as needed for sleep. 30 tablet 2   Vitamin D, Ergocalciferol, (DRISDOL) 1.25 MG (50000 UNIT) CAPS capsule Take 1 capsule (50,000 Units total) by mouth every Sunday. Take one tablet wkly 4 capsule 2   gabapentin (NEURONTIN) 300 MG capsule Take 1 capsule (300 mg total) by mouth 3 (three) times daily for 5 days. 15 capsule 0   No current facility-administered medications for this visit.    PAST MEDICAL HISTORY: Past Medical History:  Diagnosis Date   Allergy    hay fever   Blood transfusion without reported diagnosis    Cerebral aneurysm 1992   "leaking"; treated by Dr Sherwood Gambler   Depression    Diabetes mellitus without complication ([place])    type 2   GERD (gastroesophageal  reflux disease)    past hx     PAST SURGICAL HISTORY: Past Surgical History:  Procedure Laterality Date   APPENDECTOMY     CEREBRAL ANEURYSM REPAIR  1992   clipping by Dr Sherwood Gambler; cannot have an MRI   CRANIOTOMY Bilateral 02/27/2022   Procedure: Craniotomy - bilateral - Parietal;  Surgeon: Earnie Larsson, MD;  Location: [facility];  Service: Neurosurgery;  Laterality: Bilateral;   HERNIA REPAIR N/A 1990   inguinal   INGUINAL HERNIA REPAIR     as a child and then another at 9 yrs old  TONSILLECTOMY     TUMOR EXCISION  2005   benign cervical    FAMILY HISTORY: Family History  Problem Relation Age of Onset   Breast cancer Mother    Diabetes Brother    Colon cancer Neg Hx    Colon polyps Neg Hx    Esophageal cancer Neg Hx    Rectal cancer Neg Hx    Stomach cancer Neg Hx     SOCIAL HISTORY: Social History   Socioeconomic History   Marital status: Widowed    Spouse name: Not on file   Number of children: 4   Years of education: Not on file   Highest education level: Not on file  Occupational History   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  Tobacco Use   Smoking status: Former    Packs/day: 1.00    Years: 10.00    Total pack years: 10.00    Types: Cigarettes    Quit date: 2005    Years since quitting: 19.2   Smokeless tobacco: Never  Vaping Use   Vaping Use: Some days  Substance and Sexual Activity   Alcohol use: Not Currently   Drug use: Not Currently   Sexual activity: Not Currently  Other Topics Concern   Not on file  Social History Narrative   Right handed   Widowed recently   Social Determinants of Health   Financial Resource Strain: Not on file  Food Insecurity: Not on file  Transportation Needs: Not on file  Physical Activity: Not on file  Stress: Not on file  Social Connections: Not on file  Intimate Partner Violence: Not on file      Marcial Pacas, M.D. Ph.D.  [facility] [address], [place],  [address] Ph: [phone] Fax: [phone]  CC:  Lorrene Reid, PA-C No address on file  Lorrene Reid, PA-C (Inactive)

## 2023-01-02 NOTE — Telephone Encounter (Signed)
error 

## 2023-01-02 NOTE — Telephone Encounter (Signed)
[facility] called and asked about the paperwork that was brought back to the facility with patient and asking what xeomin would be for and what are steps in getting it. I did not see this medication on the patients AVS, advised we would reach out if this was something to be pursued. Please advise

## 2023-01-02 NOTE — Telephone Encounter (Signed)
I started xeomin preauthorization today 400 units  It is for spastic left upper and lower extremity pain,

## 2023-01-03 ENCOUNTER — Other Ambulatory Visit (HOSPITAL_COMMUNITY): Payer: Self-pay

## 2023-01-03 DIAGNOSIS — G8114 Spastic hemiplegia affecting left nondominant side: Secondary | ICD-10-CM | POA: Diagnosis not present

## 2023-01-03 DIAGNOSIS — G936 Cerebral edema: Secondary | ICD-10-CM | POA: Diagnosis not present

## 2023-01-03 NOTE — Telephone Encounter (Signed)
PA needed for Xeomin 400 units -[id number]-electrical stimulation  G81.14-Spastic Hemiplegia  [phone]

## 2023-01-03 NOTE — Telephone Encounter (Signed)
Pharmacy Patient Advocate Encounter   Received notification from [facility] that prior authorization for Xeomin 400 Units is required/requested. PA submitted on 01/03/2023 to (ins) [employer] via [employer] or [facility]) confirmation # [id number] Status is pending

## 2023-01-03 NOTE — Telephone Encounter (Signed)
Submitted a Benefits Verification Via the Xeomin portal.

## 2023-01-05 ENCOUNTER — Other Ambulatory Visit (HOSPITAL_COMMUNITY): Payer: Self-pay

## 2023-01-05 DIAGNOSIS — G40209 Localization-related (focal) (partial) symptomatic epilepsy and epileptic syndromes with complex partial seizures, not intractable, without status epilepticus: Secondary | ICD-10-CM | POA: Diagnosis not present

## 2023-01-05 DIAGNOSIS — G8114 Spastic hemiplegia affecting left nondominant side: Secondary | ICD-10-CM | POA: Diagnosis not present

## 2023-01-05 DIAGNOSIS — D329 Benign neoplasm of meninges, unspecified: Secondary | ICD-10-CM | POA: Diagnosis not present

## 2023-01-05 DIAGNOSIS — F419 Anxiety disorder, unspecified: Secondary | ICD-10-CM | POA: Diagnosis not present

## 2023-01-08 DIAGNOSIS — G936 Cerebral edema: Secondary | ICD-10-CM | POA: Diagnosis not present

## 2023-01-08 DIAGNOSIS — G8114 Spastic hemiplegia affecting left nondominant side: Secondary | ICD-10-CM | POA: Diagnosis not present

## 2023-01-09 DIAGNOSIS — G936 Cerebral edema: Secondary | ICD-10-CM | POA: Diagnosis not present

## 2023-01-09 DIAGNOSIS — G8114 Spastic hemiplegia affecting left nondominant side: Secondary | ICD-10-CM | POA: Diagnosis not present

## 2023-01-10 ENCOUNTER — Other Ambulatory Visit (HOSPITAL_COMMUNITY): Payer: Self-pay

## 2023-01-10 NOTE — Telephone Encounter (Signed)
Pharmacy Patient Advocate Encounter- Botox BIV-Medical Benefit:  J code: [id number] CPT code:  [id number] stimulation  G81.14-Spastic Hemiplegia  [phone]         PA was submitted to [facility] and has been approved through: 10/23/2023 Authorization# PA Case ID #: [id number] This was approved via Medical Benefits-this will be Buy and Bill.

## 2023-01-12 DIAGNOSIS — G8114 Spastic hemiplegia affecting left nondominant side: Secondary | ICD-10-CM | POA: Diagnosis not present

## 2023-01-12 DIAGNOSIS — G936 Cerebral edema: Secondary | ICD-10-CM | POA: Diagnosis not present

## 2023-01-22 DIAGNOSIS — I69354 Hemiplegia and hemiparesis following cerebral infarction affecting left non-dominant side: Secondary | ICD-10-CM | POA: Diagnosis not present

## 2023-01-22 DIAGNOSIS — F419 Anxiety disorder, unspecified: Secondary | ICD-10-CM | POA: Diagnosis not present

## 2023-01-22 DIAGNOSIS — R410 Disorientation, unspecified: Secondary | ICD-10-CM | POA: Diagnosis not present

## 2023-01-22 DIAGNOSIS — M79632 Pain in left forearm: Secondary | ICD-10-CM | POA: Diagnosis not present

## 2023-01-22 DIAGNOSIS — D649 Anemia, unspecified: Secondary | ICD-10-CM | POA: Diagnosis not present

## 2023-01-22 DIAGNOSIS — I10 Essential (primary) hypertension: Secondary | ICD-10-CM | POA: Diagnosis not present

## 2023-01-22 DIAGNOSIS — R4182 Altered mental status, unspecified: Secondary | ICD-10-CM | POA: Diagnosis not present

## 2023-01-22 DIAGNOSIS — F22 Delusional disorders: Secondary | ICD-10-CM | POA: Diagnosis not present

## 2023-01-22 DIAGNOSIS — M79622 Pain in left upper arm: Secondary | ICD-10-CM | POA: Diagnosis not present

## 2023-01-22 DIAGNOSIS — M79602 Pain in left arm: Secondary | ICD-10-CM | POA: Diagnosis not present

## 2023-01-22 DIAGNOSIS — D329 Benign neoplasm of meninges, unspecified: Secondary | ICD-10-CM | POA: Diagnosis not present

## 2023-01-22 DIAGNOSIS — N390 Urinary tract infection, site not specified: Secondary | ICD-10-CM | POA: Diagnosis not present

## 2023-01-22 DIAGNOSIS — R0989 Other specified symptoms and signs involving the circulatory and respiratory systems: Secondary | ICD-10-CM | POA: Diagnosis not present

## 2023-01-22 DIAGNOSIS — M25512 Pain in left shoulder: Secondary | ICD-10-CM | POA: Diagnosis not present

## 2023-01-24 DIAGNOSIS — R41841 Cognitive communication deficit: Secondary | ICD-10-CM | POA: Diagnosis not present

## 2023-01-24 DIAGNOSIS — R2689 Other abnormalities of gait and mobility: Secondary | ICD-10-CM | POA: Diagnosis not present

## 2023-01-24 DIAGNOSIS — G936 Cerebral edema: Secondary | ICD-10-CM | POA: Diagnosis not present

## 2023-01-25 DIAGNOSIS — G936 Cerebral edema: Secondary | ICD-10-CM | POA: Diagnosis not present

## 2023-01-25 DIAGNOSIS — R2689 Other abnormalities of gait and mobility: Secondary | ICD-10-CM | POA: Diagnosis not present

## 2023-01-25 DIAGNOSIS — R41841 Cognitive communication deficit: Secondary | ICD-10-CM | POA: Diagnosis not present

## 2023-01-26 DIAGNOSIS — F419 Anxiety disorder, unspecified: Secondary | ICD-10-CM | POA: Diagnosis not present

## 2023-01-26 DIAGNOSIS — F688 Other specified disorders of adult personality and behavior: Secondary | ICD-10-CM | POA: Diagnosis not present

## 2023-01-26 DIAGNOSIS — G936 Cerebral edema: Secondary | ICD-10-CM | POA: Diagnosis not present

## 2023-01-26 DIAGNOSIS — R2689 Other abnormalities of gait and mobility: Secondary | ICD-10-CM | POA: Diagnosis not present

## 2023-01-26 DIAGNOSIS — R413 Other amnesia: Secondary | ICD-10-CM | POA: Diagnosis not present

## 2023-01-26 DIAGNOSIS — G8194 Hemiplegia, unspecified affecting left nondominant side: Secondary | ICD-10-CM | POA: Diagnosis not present

## 2023-01-26 DIAGNOSIS — R41841 Cognitive communication deficit: Secondary | ICD-10-CM | POA: Diagnosis not present

## 2023-01-26 DIAGNOSIS — D329 Benign neoplasm of meninges, unspecified: Secondary | ICD-10-CM | POA: Diagnosis not present

## 2023-01-26 DIAGNOSIS — Z9181 History of falling: Secondary | ICD-10-CM | POA: Diagnosis not present

## 2023-01-29 DIAGNOSIS — R41841 Cognitive communication deficit: Secondary | ICD-10-CM | POA: Diagnosis not present

## 2023-01-29 DIAGNOSIS — I10 Essential (primary) hypertension: Secondary | ICD-10-CM | POA: Diagnosis not present

## 2023-01-29 DIAGNOSIS — D649 Anemia, unspecified: Secondary | ICD-10-CM | POA: Diagnosis not present

## 2023-01-29 DIAGNOSIS — R2689 Other abnormalities of gait and mobility: Secondary | ICD-10-CM | POA: Diagnosis not present

## 2023-01-29 DIAGNOSIS — G936 Cerebral edema: Secondary | ICD-10-CM | POA: Diagnosis not present

## 2023-01-30 DIAGNOSIS — G936 Cerebral edema: Secondary | ICD-10-CM | POA: Diagnosis not present

## 2023-01-30 DIAGNOSIS — R2689 Other abnormalities of gait and mobility: Secondary | ICD-10-CM | POA: Diagnosis not present

## 2023-01-30 DIAGNOSIS — R41841 Cognitive communication deficit: Secondary | ICD-10-CM | POA: Diagnosis not present

## 2023-01-31 DIAGNOSIS — R2689 Other abnormalities of gait and mobility: Secondary | ICD-10-CM | POA: Diagnosis not present

## 2023-01-31 DIAGNOSIS — G936 Cerebral edema: Secondary | ICD-10-CM | POA: Diagnosis not present

## 2023-01-31 DIAGNOSIS — R41841 Cognitive communication deficit: Secondary | ICD-10-CM | POA: Diagnosis not present

## 2023-02-01 DIAGNOSIS — R2689 Other abnormalities of gait and mobility: Secondary | ICD-10-CM | POA: Diagnosis not present

## 2023-02-01 DIAGNOSIS — G936 Cerebral edema: Secondary | ICD-10-CM | POA: Diagnosis not present

## 2023-02-01 DIAGNOSIS — R41841 Cognitive communication deficit: Secondary | ICD-10-CM | POA: Diagnosis not present

## 2023-02-02 DIAGNOSIS — G936 Cerebral edema: Secondary | ICD-10-CM | POA: Diagnosis not present

## 2023-02-02 DIAGNOSIS — R41841 Cognitive communication deficit: Secondary | ICD-10-CM | POA: Diagnosis not present

## 2023-02-02 DIAGNOSIS — R2689 Other abnormalities of gait and mobility: Secondary | ICD-10-CM | POA: Diagnosis not present

## 2023-02-05 DIAGNOSIS — Z0189 Encounter for other specified special examinations: Secondary | ICD-10-CM | POA: Diagnosis not present

## 2023-02-05 DIAGNOSIS — L219 Seborrheic dermatitis, unspecified: Secondary | ICD-10-CM | POA: Diagnosis not present

## 2023-02-05 DIAGNOSIS — F32A Depression, unspecified: Secondary | ICD-10-CM | POA: Diagnosis not present

## 2023-02-05 DIAGNOSIS — F639 Impulse disorder, unspecified: Secondary | ICD-10-CM | POA: Diagnosis not present

## 2023-02-05 DIAGNOSIS — R41841 Cognitive communication deficit: Secondary | ICD-10-CM | POA: Diagnosis not present

## 2023-02-05 DIAGNOSIS — E119 Type 2 diabetes mellitus without complications: Secondary | ICD-10-CM | POA: Diagnosis not present

## 2023-02-05 DIAGNOSIS — D329 Benign neoplasm of meninges, unspecified: Secondary | ICD-10-CM | POA: Diagnosis not present

## 2023-02-05 DIAGNOSIS — G936 Cerebral edema: Secondary | ICD-10-CM | POA: Diagnosis not present

## 2023-02-05 DIAGNOSIS — R2689 Other abnormalities of gait and mobility: Secondary | ICD-10-CM | POA: Diagnosis not present

## 2023-02-05 DIAGNOSIS — G40909 Epilepsy, unspecified, not intractable, without status epilepticus: Secondary | ICD-10-CM | POA: Diagnosis not present

## 2023-02-05 DIAGNOSIS — G8114 Spastic hemiplegia affecting left nondominant side: Secondary | ICD-10-CM | POA: Diagnosis not present

## 2023-02-06 ENCOUNTER — Telehealth: Payer: Self-pay

## 2023-02-06 DIAGNOSIS — G936 Cerebral edema: Secondary | ICD-10-CM | POA: Diagnosis not present

## 2023-02-06 DIAGNOSIS — R41841 Cognitive communication deficit: Secondary | ICD-10-CM | POA: Diagnosis not present

## 2023-02-06 DIAGNOSIS — R2689 Other abnormalities of gait and mobility: Secondary | ICD-10-CM | POA: Diagnosis not present

## 2023-02-06 NOTE — Telephone Encounter (Signed)
Called patient to schedule Medicare Annual Wellness Visit (AWV). No voicemail available to leave a message.  Last date of AWV: 11/10/20  Please schedule an appointment at any time with [facility] on Annual Wellness Schedule.    Agnes Lawrence, CMA (AAMA)  [facility]- AWV Program [phone]

## 2023-02-07 DIAGNOSIS — G936 Cerebral edema: Secondary | ICD-10-CM | POA: Diagnosis not present

## 2023-02-07 DIAGNOSIS — R41841 Cognitive communication deficit: Secondary | ICD-10-CM | POA: Diagnosis not present

## 2023-02-07 DIAGNOSIS — R2689 Other abnormalities of gait and mobility: Secondary | ICD-10-CM | POA: Diagnosis not present

## 2023-02-08 DIAGNOSIS — R2689 Other abnormalities of gait and mobility: Secondary | ICD-10-CM | POA: Diagnosis not present

## 2023-02-08 DIAGNOSIS — R41841 Cognitive communication deficit: Secondary | ICD-10-CM | POA: Diagnosis not present

## 2023-02-08 DIAGNOSIS — G936 Cerebral edema: Secondary | ICD-10-CM | POA: Diagnosis not present

## 2023-02-09 DIAGNOSIS — R2689 Other abnormalities of gait and mobility: Secondary | ICD-10-CM | POA: Diagnosis not present

## 2023-02-09 DIAGNOSIS — G936 Cerebral edema: Secondary | ICD-10-CM | POA: Diagnosis not present

## 2023-02-09 DIAGNOSIS — R41841 Cognitive communication deficit: Secondary | ICD-10-CM | POA: Diagnosis not present

## 2023-02-11 DIAGNOSIS — G936 Cerebral edema: Secondary | ICD-10-CM | POA: Diagnosis not present

## 2023-02-11 DIAGNOSIS — R2689 Other abnormalities of gait and mobility: Secondary | ICD-10-CM | POA: Diagnosis not present

## 2023-02-11 DIAGNOSIS — R41841 Cognitive communication deficit: Secondary | ICD-10-CM | POA: Diagnosis not present

## 2023-02-13 DIAGNOSIS — G936 Cerebral edema: Secondary | ICD-10-CM | POA: Diagnosis not present

## 2023-02-13 DIAGNOSIS — R41841 Cognitive communication deficit: Secondary | ICD-10-CM | POA: Diagnosis not present

## 2023-02-13 DIAGNOSIS — R2689 Other abnormalities of gait and mobility: Secondary | ICD-10-CM | POA: Diagnosis not present

## 2023-02-14 DIAGNOSIS — R41841 Cognitive communication deficit: Secondary | ICD-10-CM | POA: Diagnosis not present

## 2023-02-14 DIAGNOSIS — R2689 Other abnormalities of gait and mobility: Secondary | ICD-10-CM | POA: Diagnosis not present

## 2023-02-14 DIAGNOSIS — G936 Cerebral edema: Secondary | ICD-10-CM | POA: Diagnosis not present

## 2023-02-15 DIAGNOSIS — G936 Cerebral edema: Secondary | ICD-10-CM | POA: Diagnosis not present

## 2023-02-15 DIAGNOSIS — R2689 Other abnormalities of gait and mobility: Secondary | ICD-10-CM | POA: Diagnosis not present

## 2023-02-15 DIAGNOSIS — R41841 Cognitive communication deficit: Secondary | ICD-10-CM | POA: Diagnosis not present

## 2023-02-16 DIAGNOSIS — R2689 Other abnormalities of gait and mobility: Secondary | ICD-10-CM | POA: Diagnosis not present

## 2023-02-16 DIAGNOSIS — G936 Cerebral edema: Secondary | ICD-10-CM | POA: Diagnosis not present

## 2023-02-16 DIAGNOSIS — R41841 Cognitive communication deficit: Secondary | ICD-10-CM | POA: Diagnosis not present

## 2023-02-19 DIAGNOSIS — R2689 Other abnormalities of gait and mobility: Secondary | ICD-10-CM | POA: Diagnosis not present

## 2023-02-19 DIAGNOSIS — G936 Cerebral edema: Secondary | ICD-10-CM | POA: Diagnosis not present

## 2023-02-19 DIAGNOSIS — R41841 Cognitive communication deficit: Secondary | ICD-10-CM | POA: Diagnosis not present

## 2023-02-20 ENCOUNTER — Ambulatory Visit (INDEPENDENT_AMBULATORY_CARE_PROVIDER_SITE_OTHER): Payer: Medicare HMO | Admitting: Neurology

## 2023-02-20 VITALS — BP 82/49

## 2023-02-20 DIAGNOSIS — R2689 Other abnormalities of gait and mobility: Secondary | ICD-10-CM | POA: Diagnosis not present

## 2023-02-20 DIAGNOSIS — R269 Unspecified abnormalities of gait and mobility: Secondary | ICD-10-CM

## 2023-02-20 DIAGNOSIS — R41841 Cognitive communication deficit: Secondary | ICD-10-CM | POA: Diagnosis not present

## 2023-02-20 DIAGNOSIS — G8114 Spastic hemiplegia affecting left nondominant side: Secondary | ICD-10-CM

## 2023-02-20 DIAGNOSIS — G936 Cerebral edema: Secondary | ICD-10-CM | POA: Diagnosis not present

## 2023-02-20 DIAGNOSIS — G8194 Hemiplegia, unspecified affecting left nondominant side: Secondary | ICD-10-CM

## 2023-02-20 MED ORDER — INCOBOTULINUMTOXINA 100 UNITS IM SOLR
400.0000 [IU] | Freq: Once | INTRAMUSCULAR | Status: AC
  Administered 2023-02-21: 400 [IU] via INTRAMUSCULAR

## 2023-02-20 NOTE — Progress Notes (Signed)
xeomin- 100 units x 4 vials Lot: [id number] Expiration: 06.2026 NDC: 1610-9604-54  Bacteriostatic 0.9% Sodium Chloride- 8 mL  Lot: [id number] Expiration: 11.25 NDC: 47829-562-13  Dx: G81.94 B/B Witnessed by Margaree Mackintosh

## 2023-02-20 NOTE — Progress Notes (Unsigned)
Chief Complaint  Patient presents with   Procedure    Rm 14 cna from [facility] present patient is well and ready for Xenomin injection       ASSESSMENT AND PLAN  Patrick Buchanan is a 72 y.o. 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   MEDICAL HISTORY:  Patrick Buchanan, is a 72 year old male, seen in request by his primary care PA Mayer Masker, for evaluation of gradual onset of left side difficulty, unsteady gait, initial evaluation was on January 06, 2022.   I reviewed and summarized the referring note. PMHX Depression HLD Cerebral aneurysm surgery in 1992, presented with sudden onset severe headache Cervical sudural benign tumor,in 2005  presented with right arm weakness  He had a history of right brain aneurysm bleeding in 1992, presented with sudden onset severe headaches, status post right craniectomy by Dr. 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 at age 53, he also had a history of cervical subdural benign tumor in 2005, presented with right arm weakness, surgical was successful, he denies any focal deficit  He went through very stressful 2022, his wife was diagnosed with pancreatic cancer in  summer, and just passed away in 02-07-23during that period of time, he was focused on his wife  Only afterwards, he noticed he has left leg difficulty, unsteady gait, denies left arm weakness, but on examination, he has noticeable spastic left upper and lower extremity weakness,  Over the past few months, he also describes intermittent uncontrollable left leg jerking movements lasting 2 to 3 minutes, mild confusion, but never spreading to left arm  He denies incontinence, denies right-sided involvement  UPDATE January 02 2023: Doing a CT angiogram study through 2023, he was found to have numerous partially calcified extra-axial mass, throughout both hemisphere, largest lesion on the right centered over the right frontal parietal region near the vertex, measuring 4 x 7 x 3 0.5 x 5.4 cm, significant mass effect on the underlying brain parenchyma with surrounding edema at the right frontal parietal lobe,  CT angiogram showed postsurgical change, clipping of right ICA terminus aneurysm with no evidence of recurrent aneurysm  He underwent pan bilateral parietal craniotomy with resection of parasagittal tumor greater than 6 cm in diameter, mesh cranioplasty 3 x 6 cm, by Dr. Julio Sicks  Surgical. pathology consistent with meningioma, WHO grade 1  Multiple postsurgical repeat CT head showed postsurgical change, continue evidence of homogeneous enhancing lesion likely meningioma involving left middle cranial fossa, 4.5 x 2.6 x 3.0 cm, probably involvement of left cavernous sinus with surrounding low-attenuation, presumably due to vasogenic edema, inferior left parietal occipital region, measuring 3.4 x 2.9 x 4.0 cm, with surrounding edema,  CT of the cervical spine showed status post laminectomy from C3 6 to T1, no acute abnormality  Laboratory evaluations in 2024, hemoglobin of 11.4, BMP showed calcium 8.4, otherwise normal,  Postsurgically, he was discharged to rehabilitation, then nursing home,  profound left leg weakness, mild left arm weakness, significant left shoulder pain on passive movement,  He also complains of transient episode of confusion, lasting for few minutes, about once a month, suggestive of partial seizure, is already on lamotrigine 100 twice a day, Vimpat 100 mg twice a day, overall tolerating the medication well, also on polypharmacy, gabapentin and Abilify, trazodone,  UPDATE February 20 2023:    PHYSICAL EXAM:   Vitals:   02/20/23 1307 02/20/23 1312  BP: (!) 82/53 (!) 82/49     PHYSICAL EXAMNIATION:  Gen: NAD, conversant, well nourised, well groomed                     Cardiovascular: Regular rate rhythm, no peripheral edema, warm, nontender. Eyes: Conjunctivae clear without exudates or hemorrhage Neck: Supple, no carotid bruits. Pulmonary: Clear to auscultation bilaterally   NEUROLOGICAL EXAM:  MENTAL STATUS: Speech:    Speech is normal; fluent and spontaneous with normal comprehension.  Cognition:     Orientation to time, place and person     Normal recent and remote memory     Normal Attention span and concentration     Normal Language, naming, repeating,spontaneous speech     Fund of knowledge   CRANIAL NERVES: CN II: Visual fields are full to confrontation. Pupils are round equal and briskly reactive to light. CN III, IV, VI: extraocular movement are normal. No ptosis. CN V: Facial sensation is intact to light touch CN VII: Face is symmetric with normal eye closure  CN VIII: Hearing is normal to causal conversation. CN IX, X: Phonation is normal. CN XI: Head turning and shoulder shrug are intact  MOTOR: Significant left shoulder pain upon passive movement, no antigravity movement, limited range of motion of left elbow, maximum extension 150 degree, okay to left hand grip, no antigravity movement of left lower extremity  REFLEXES: Brisk left upper and lower extremity reflex  SENSORY: Intact to light touch, pinprick and vibratory  sensation are intact in fingers and toes.  COORDINATION: There is no trunk or limb dysmetria noted.  GAIT/STANCE: Deferred  REVIEW OF SYSTEMS:  Full 14 system review of systems performed and notable only for as above All other review of systems were negative.   ALLERGIES: Allergies  Allergen Reactions   Other Itching and Other (See Comments)    Hay fever- Itchy eyes, runny nose, congestion- no breathing issues, however    HOME MEDICATIONS: Current Outpatient Medications  Medication Sig Dispense Refill   acetaminophen (TYLENOL) 500 MG tablet Take 1,000 mg by mouth 2 (two) times daily.     ALPRAZolam (XANAX) 0.5 MG tablet Take 1 tablet (0.5 mg total) by mouth 2 (two) times daily as needed for anxiety. 5 tablet 0   ARIPiprazole (ABILIFY) 10 MG tablet Take 10 mg by mouth daily.     baclofen (LIORESAL) 10 MG tablet Take 1 tablet (10 mg total) by mouth 3 (three) times daily. 90 each 1   bisacodyl (DULCOLAX) 10 MG suppository Place 1 suppository (10 mg total) rectally daily as needed for moderate constipation. 12 suppository 0   cephALEXin (KEFLEX) 500 MG capsule Take 1 capsule (500 mg total) by mouth 3 (three) times daily. 21 capsule 0   Cholecalciferol (VITAMIN D3 PO) Take 1 capsule by mouth daily.     diclofenac Sodium (VOLTAREN) 1 % GEL Apply  2 g topically 3 (three) times daily. 50 g 0   escitalopram (LEXAPRO) 20 MG tablet Take 1 tablet (20 mg total) by mouth daily. 30 tablet 2   gabapentin (NEURONTIN) 100 MG capsule Take 1 capsule (100 mg total) by mouth 2 (two) times daily. 60 capsule 2   gabapentin (NEURONTIN) 300 MG capsule Take 300 mg by mouth daily.     gabapentin (NEURONTIN) 300 MG capsule Take 1 capsule (300 mg total) by mouth 3 (three) times daily for 5 days. 15 capsule 0   HYDROcodone-acetaminophen (NORCO/VICODIN) 5-325 MG tablet Take by mouth.     ketoconazole (NIZORAL) 2 % cream Apply 1 Application topically daily.     ketoconazole (NIZORAL) 2 % shampoo Apply 1  Application topically. Every 20 days     Lacosamide 100 MG TABS Take 1 tablet (100 mg total) by mouth 2 (two) times daily. 60 tablet 11   lamoTRIgine (LAMICTAL) 100 MG tablet Take 1 tablet (100 mg total) by mouth 2 (two) times daily. 60 tablet 11   linaclotide (LINZESS) 145 MCG CAPS capsule Take 1 capsule (145 mcg total) by mouth daily before breakfast. 30 capsule 2   magnesium gluconate (MAGONATE) 500 MG tablet Take 0.5 tablets (250 mg total) by mouth at bedtime. (Patient taking differently: Take 250 mg by mouth every other day.) 30 tablet 2   magnesium hydroxide (MILK OF MAGNESIA) 400 MG/5ML suspension Take 30 mLs by mouth daily as needed for mild constipation.     Multiple Vitamin (MULTIVITAMIN) tablet Take 1 tablet by mouth daily.     pantoprazole (PROTONIX) 40 MG tablet Take 1 tablet (40 mg total) by mouth at bedtime. 30 tablet 2   polyvinyl alcohol (LIQUIFILM TEARS) 1.4 % ophthalmic solution Place 1 drop into both eyes as needed for dry eyes.     QUEtiapine (SEROQUEL) 50 MG tablet Take 1 tablet (50 mg total) by mouth at bedtime.     rosuvastatin (CRESTOR) 20 MG tablet Take 1 tablet (20 mg total) by mouth daily. 90 tablet 3   senna-docusate (SENOKOT-S) 8.6-50 MG tablet Take 2 tablets by mouth at bedtime.     tamsulosin (FLOMAX) 0.4 MG CAPS capsule Take 1 capsule (0.4 mg total) by mouth daily after supper. 30 capsule    traZODone (DESYREL) 50 MG tablet Take 0.5-1 tablets (25-50 mg total) by mouth at bedtime as needed for sleep. 30 tablet 2   Vitamin D, Ergocalciferol, (DRISDOL) 1.25 MG (50000 UNIT) CAPS capsule Take 1 capsule (50,000 Units total) by mouth every Sunday. Take one tablet wkly 4 capsule 2   Current Facility-Administered Medications  Medication Dose Route Frequency Provider Last Rate Last Admin   incobotulinumtoxinA (XEOMIN) 100 units injection 400 Units  400 Units Intramuscular Once Levert Feinstein, MD        PAST MEDICAL HISTORY: Past Medical History:  Diagnosis Date   Allergy     hay fever   Blood transfusion without reported diagnosis    Cerebral aneurysm 1992   "leaking"; treated by Dr Newell Coral   Depression    Diabetes mellitus without complication (HCC)    type 2   GERD (gastroesophageal reflux disease)    past hx     PAST SURGICAL HISTORY: Past Surgical History:  Procedure Laterality Date   APPENDECTOMY     CEREBRAL ANEURYSM REPAIR  1992   clipping by Dr Newell Coral; cannot have an MRI   CRANIOTOMY Bilateral 02/27/2022   Procedure: Craniotomy - bilateral - Parietal;  Surgeon: Julio Sicks, MD;  Location:  [facility] OR;  Service: Neurosurgery;  Laterality: Bilateral;   HERNIA REPAIR N/A 1990   inguinal   INGUINAL HERNIA REPAIR     as a child and then another at 30 yrs old   TONSILLECTOMY     TUMOR EXCISION  2005   benign cervical    FAMILY HISTORY: Family History  Problem Relation Age of Onset   Breast cancer Mother    Diabetes Brother    Colon cancer Neg Hx    Colon polyps Neg Hx    Esophageal cancer Neg Hx    Rectal cancer Neg Hx    Stomach cancer Neg Hx     SOCIAL HISTORY: Social History   Socioeconomic History   Marital status: Widowed    Spouse name: Not on file   Number of children: 4   Years of education: Not on file   Highest education level: Not on file  Occupational History   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  Tobacco Use   Smoking status: Former    Packs/day: 1.00    Years: 10.00    Additional pack years: 0.00    Total pack years: 10.00    Types: Cigarettes    Quit date: 2005    Years since quitting: 19.3   Smokeless tobacco: Never  Vaping Use   Vaping Use: Some days  Substance and Sexual Activity   Alcohol use: Not Currently   Drug use: Not Currently   Sexual activity: Not Currently  Other Topics Concern   Not on file  Social History Narrative   Right handed   Widowed recently   Social Determinants of Health   Financial Resource Strain: Not on file  Food Insecurity: Not on file  Transportation Needs: Not on file   Physical Activity: Not on file  Stress: Not on file  Social Connections: Not on file  Intimate Partner Violence: Not on file      Levert Feinstein, M.D. Ph.D.  [facility] [address] [place], [place] [address] Ph: [phone] Fax: [phone]  CC:  No referring provider defined for this encounter.  Mayer Masker, PA-C (Inactive)

## 2023-02-21 DIAGNOSIS — R2689 Other abnormalities of gait and mobility: Secondary | ICD-10-CM | POA: Diagnosis not present

## 2023-02-21 DIAGNOSIS — G936 Cerebral edema: Secondary | ICD-10-CM | POA: Diagnosis not present

## 2023-02-21 DIAGNOSIS — R41841 Cognitive communication deficit: Secondary | ICD-10-CM | POA: Diagnosis not present

## 2023-02-22 DIAGNOSIS — R2689 Other abnormalities of gait and mobility: Secondary | ICD-10-CM | POA: Diagnosis not present

## 2023-02-22 DIAGNOSIS — R41841 Cognitive communication deficit: Secondary | ICD-10-CM | POA: Diagnosis not present

## 2023-02-22 DIAGNOSIS — G936 Cerebral edema: Secondary | ICD-10-CM | POA: Diagnosis not present

## 2023-02-23 DIAGNOSIS — G936 Cerebral edema: Secondary | ICD-10-CM | POA: Diagnosis not present

## 2023-02-23 DIAGNOSIS — R41841 Cognitive communication deficit: Secondary | ICD-10-CM | POA: Diagnosis not present

## 2023-02-23 DIAGNOSIS — R2689 Other abnormalities of gait and mobility: Secondary | ICD-10-CM | POA: Diagnosis not present

## 2023-02-26 DIAGNOSIS — R41841 Cognitive communication deficit: Secondary | ICD-10-CM | POA: Diagnosis not present

## 2023-02-26 DIAGNOSIS — G936 Cerebral edema: Secondary | ICD-10-CM | POA: Diagnosis not present

## 2023-02-26 DIAGNOSIS — R2689 Other abnormalities of gait and mobility: Secondary | ICD-10-CM | POA: Diagnosis not present

## 2023-02-27 DIAGNOSIS — F331 Major depressive disorder, recurrent, moderate: Secondary | ICD-10-CM | POA: Diagnosis not present

## 2023-02-27 DIAGNOSIS — G936 Cerebral edema: Secondary | ICD-10-CM | POA: Diagnosis not present

## 2023-02-27 DIAGNOSIS — R2689 Other abnormalities of gait and mobility: Secondary | ICD-10-CM | POA: Diagnosis not present

## 2023-02-27 DIAGNOSIS — F4323 Adjustment disorder with mixed anxiety and depressed mood: Secondary | ICD-10-CM | POA: Diagnosis not present

## 2023-02-27 DIAGNOSIS — R41841 Cognitive communication deficit: Secondary | ICD-10-CM | POA: Diagnosis not present

## 2023-02-28 DIAGNOSIS — R2689 Other abnormalities of gait and mobility: Secondary | ICD-10-CM | POA: Diagnosis not present

## 2023-02-28 DIAGNOSIS — G936 Cerebral edema: Secondary | ICD-10-CM | POA: Diagnosis not present

## 2023-02-28 DIAGNOSIS — R41841 Cognitive communication deficit: Secondary | ICD-10-CM | POA: Diagnosis not present

## 2023-03-01 DIAGNOSIS — R2689 Other abnormalities of gait and mobility: Secondary | ICD-10-CM | POA: Diagnosis not present

## 2023-03-01 DIAGNOSIS — G936 Cerebral edema: Secondary | ICD-10-CM | POA: Diagnosis not present

## 2023-03-01 DIAGNOSIS — R41841 Cognitive communication deficit: Secondary | ICD-10-CM | POA: Diagnosis not present

## 2023-03-02 DIAGNOSIS — R2689 Other abnormalities of gait and mobility: Secondary | ICD-10-CM | POA: Diagnosis not present

## 2023-03-02 DIAGNOSIS — R41841 Cognitive communication deficit: Secondary | ICD-10-CM | POA: Diagnosis not present

## 2023-03-02 DIAGNOSIS — G936 Cerebral edema: Secondary | ICD-10-CM | POA: Diagnosis not present

## 2023-03-05 DIAGNOSIS — G936 Cerebral edema: Secondary | ICD-10-CM | POA: Diagnosis not present

## 2023-03-05 DIAGNOSIS — R41841 Cognitive communication deficit: Secondary | ICD-10-CM | POA: Diagnosis not present

## 2023-03-05 DIAGNOSIS — R2689 Other abnormalities of gait and mobility: Secondary | ICD-10-CM | POA: Diagnosis not present

## 2023-03-06 DIAGNOSIS — R41841 Cognitive communication deficit: Secondary | ICD-10-CM | POA: Diagnosis not present

## 2023-03-06 DIAGNOSIS — R2689 Other abnormalities of gait and mobility: Secondary | ICD-10-CM | POA: Diagnosis not present

## 2023-03-06 DIAGNOSIS — G936 Cerebral edema: Secondary | ICD-10-CM | POA: Diagnosis not present

## 2023-03-07 DIAGNOSIS — R41841 Cognitive communication deficit: Secondary | ICD-10-CM | POA: Diagnosis not present

## 2023-03-07 DIAGNOSIS — R2689 Other abnormalities of gait and mobility: Secondary | ICD-10-CM | POA: Diagnosis not present

## 2023-03-07 DIAGNOSIS — G936 Cerebral edema: Secondary | ICD-10-CM | POA: Diagnosis not present

## 2023-03-08 DIAGNOSIS — G936 Cerebral edema: Secondary | ICD-10-CM | POA: Diagnosis not present

## 2023-03-08 DIAGNOSIS — R41841 Cognitive communication deficit: Secondary | ICD-10-CM | POA: Diagnosis not present

## 2023-03-08 DIAGNOSIS — R2689 Other abnormalities of gait and mobility: Secondary | ICD-10-CM | POA: Diagnosis not present

## 2023-03-09 DIAGNOSIS — G936 Cerebral edema: Secondary | ICD-10-CM | POA: Diagnosis not present

## 2023-03-09 DIAGNOSIS — R2689 Other abnormalities of gait and mobility: Secondary | ICD-10-CM | POA: Diagnosis not present

## 2023-03-09 DIAGNOSIS — R41841 Cognitive communication deficit: Secondary | ICD-10-CM | POA: Diagnosis not present

## 2023-03-12 DIAGNOSIS — R2689 Other abnormalities of gait and mobility: Secondary | ICD-10-CM | POA: Diagnosis not present

## 2023-03-12 DIAGNOSIS — G936 Cerebral edema: Secondary | ICD-10-CM | POA: Diagnosis not present

## 2023-03-12 DIAGNOSIS — R41841 Cognitive communication deficit: Secondary | ICD-10-CM | POA: Diagnosis not present

## 2023-03-27 DIAGNOSIS — F4323 Adjustment disorder with mixed anxiety and depressed mood: Secondary | ICD-10-CM | POA: Diagnosis not present

## 2023-03-27 DIAGNOSIS — F331 Major depressive disorder, recurrent, moderate: Secondary | ICD-10-CM | POA: Diagnosis not present

## 2023-03-28 DIAGNOSIS — D329 Benign neoplasm of meninges, unspecified: Secondary | ICD-10-CM | POA: Diagnosis not present

## 2023-04-04 DIAGNOSIS — R413 Other amnesia: Secondary | ICD-10-CM | POA: Diagnosis not present

## 2023-04-04 DIAGNOSIS — G40909 Epilepsy, unspecified, not intractable, without status epilepticus: Secondary | ICD-10-CM | POA: Diagnosis not present

## 2023-04-04 DIAGNOSIS — F411 Generalized anxiety disorder: Secondary | ICD-10-CM | POA: Diagnosis not present

## 2023-04-04 DIAGNOSIS — R419 Unspecified symptoms and signs involving cognitive functions and awareness: Secondary | ICD-10-CM | POA: Diagnosis not present

## 2023-04-04 DIAGNOSIS — D329 Benign neoplasm of meninges, unspecified: Secondary | ICD-10-CM | POA: Diagnosis not present

## 2023-04-04 DIAGNOSIS — G8114 Spastic hemiplegia affecting left nondominant side: Secondary | ICD-10-CM | POA: Diagnosis not present

## 2023-04-04 DIAGNOSIS — K5901 Slow transit constipation: Secondary | ICD-10-CM | POA: Diagnosis not present

## 2023-04-04 DIAGNOSIS — K5909 Other constipation: Secondary | ICD-10-CM | POA: Diagnosis not present

## 2023-04-04 DIAGNOSIS — E119 Type 2 diabetes mellitus without complications: Secondary | ICD-10-CM | POA: Diagnosis not present

## 2023-04-16 DIAGNOSIS — R419 Unspecified symptoms and signs involving cognitive functions and awareness: Secondary | ICD-10-CM | POA: Diagnosis not present

## 2023-04-16 DIAGNOSIS — R197 Diarrhea, unspecified: Secondary | ICD-10-CM | POA: Diagnosis not present

## 2023-04-16 DIAGNOSIS — R059 Cough, unspecified: Secondary | ICD-10-CM | POA: Diagnosis not present

## 2023-04-16 DIAGNOSIS — L219 Seborrheic dermatitis, unspecified: Secondary | ICD-10-CM | POA: Diagnosis not present

## 2023-04-16 DIAGNOSIS — M533 Sacrococcygeal disorders, not elsewhere classified: Secondary | ICD-10-CM | POA: Diagnosis not present

## 2023-04-16 DIAGNOSIS — G8114 Spastic hemiplegia affecting left nondominant side: Secondary | ICD-10-CM | POA: Diagnosis not present

## 2023-05-02 ENCOUNTER — Emergency Department (HOSPITAL_COMMUNITY): Payer: Medicare HMO

## 2023-05-02 ENCOUNTER — Other Ambulatory Visit: Payer: Self-pay

## 2023-05-02 ENCOUNTER — Encounter (HOSPITAL_COMMUNITY): Payer: Self-pay

## 2023-05-02 ENCOUNTER — Emergency Department (HOSPITAL_COMMUNITY)
Admission: EM | Admit: 2023-05-02 | Discharge: 2023-05-02 | Disposition: A | Discharge status: Home / Self Care | Payer: Medicare HMO | Attending: Emergency Medicine | Admitting: Emergency Medicine

## 2023-05-02 DIAGNOSIS — S40022A Contusion of left upper arm, initial encounter: Secondary | ICD-10-CM | POA: Diagnosis not present

## 2023-05-02 DIAGNOSIS — J432 Centrilobular emphysema: Secondary | ICD-10-CM | POA: Insufficient documentation

## 2023-05-02 DIAGNOSIS — E119 Type 2 diabetes mellitus without complications: Secondary | ICD-10-CM | POA: Diagnosis not present

## 2023-05-02 DIAGNOSIS — I10 Essential (primary) hypertension: Secondary | ICD-10-CM | POA: Diagnosis not present

## 2023-05-02 DIAGNOSIS — F17200 Nicotine dependence, unspecified, uncomplicated: Secondary | ICD-10-CM | POA: Insufficient documentation

## 2023-05-02 DIAGNOSIS — R531 Weakness: Secondary | ICD-10-CM | POA: Diagnosis not present

## 2023-05-02 DIAGNOSIS — S0990XA Unspecified injury of head, initial encounter: Secondary | ICD-10-CM | POA: Diagnosis not present

## 2023-05-02 DIAGNOSIS — H5710 Ocular pain, unspecified eye: Secondary | ICD-10-CM | POA: Diagnosis not present

## 2023-05-02 DIAGNOSIS — S40021A Contusion of right upper arm, initial encounter: Secondary | ICD-10-CM | POA: Insufficient documentation

## 2023-05-02 DIAGNOSIS — M542 Cervicalgia: Secondary | ICD-10-CM | POA: Diagnosis not present

## 2023-05-02 DIAGNOSIS — Y92129 Unspecified place in nursing home as the place of occurrence of the external cause: Secondary | ICD-10-CM | POA: Insufficient documentation

## 2023-05-02 DIAGNOSIS — Z743 Need for continuous supervision: Secondary | ICD-10-CM | POA: Diagnosis not present

## 2023-05-02 DIAGNOSIS — R9431 Abnormal electrocardiogram [ECG] [EKG]: Secondary | ICD-10-CM | POA: Diagnosis not present

## 2023-05-02 DIAGNOSIS — S199XXA Unspecified injury of neck, initial encounter: Secondary | ICD-10-CM | POA: Diagnosis not present

## 2023-05-02 DIAGNOSIS — S4991XA Unspecified injury of right shoulder and upper arm, initial encounter: Secondary | ICD-10-CM | POA: Diagnosis present

## 2023-05-02 DIAGNOSIS — W19XXXA Unspecified fall, initial encounter: Secondary | ICD-10-CM | POA: Diagnosis not present

## 2023-05-02 DIAGNOSIS — G9389 Other specified disorders of brain: Secondary | ICD-10-CM | POA: Diagnosis not present

## 2023-05-02 LAB — CBC WITH DIFFERENTIAL/PLATELET
Abs Immature Granulocytes: 0.03 10*3/uL (ref 0.00–0.07)
Basophils Absolute: 0 10*3/uL (ref 0.0–0.1)
Basophils Relative: 0 %
Eosinophils Absolute: 0 10*3/uL (ref 0.0–0.5)
Eosinophils Relative: 0 %
HCT: 43.1 % (ref 39.0–52.0)
Hemoglobin: 13.7 g/dL (ref 13.0–17.0)
Immature Granulocytes: 0 %
Lymphocytes Relative: 7 %
Lymphs Abs: 0.6 10*3/uL — ABNORMAL LOW (ref 0.7–4.0)
MCH: 29.3 pg (ref 26.0–34.0)
MCHC: 31.8 g/dL (ref 30.0–36.0)
MCV: 92.3 fL (ref 80.0–100.0)
Monocytes Absolute: 0.6 10*3/uL (ref 0.1–1.0)
Monocytes Relative: 7 %
Neutro Abs: 7.1 10*3/uL (ref 1.7–7.7)
Neutrophils Relative %: 86 %
Platelets: 199 10*3/uL (ref 150–400)
RBC: 4.67 MIL/uL (ref 4.22–5.81)
RDW: 14.9 % (ref 11.5–15.5)
WBC: 8.3 10*3/uL (ref 4.0–10.5)
nRBC: 0 % (ref 0.0–0.2)

## 2023-05-02 LAB — COMPREHENSIVE METABOLIC PANEL
ALT: 27 U/L (ref 0–44)
AST: 26 U/L (ref 15–41)
Albumin: 3.6 g/dL (ref 3.5–5.0)
Alkaline Phosphatase: 98 U/L (ref 38–126)
Anion gap: 8 (ref 5–15)
BUN: 12 mg/dL (ref 8–23)
CO2: 27 mmol/L (ref 22–32)
Calcium: 8.8 mg/dL — ABNORMAL LOW (ref 8.9–10.3)
Chloride: 104 mmol/L (ref 98–111)
Creatinine, Ser: 0.73 mg/dL (ref 0.61–1.24)
GFR, Estimated: >60 mL/min (ref >60)
Glucose, Bld: 81 mg/dL (ref 70–99)
Potassium: 3.8 mmol/L (ref 3.5–5.1)
Sodium: 139 mmol/L (ref 135–145)
Total Bilirubin: 0.7 mg/dL (ref 0.3–1.2)
Total Protein: 6.5 g/dL (ref 6.5–8.1)

## 2023-05-02 LAB — CK: Total CK: 137 U/L (ref 49–397)

## 2023-05-02 LAB — CBG MONITORING, ED: Glucose-Capillary: 85 mg/dL (ref 70–99)

## 2023-05-02 NOTE — ED Provider Notes (Addendum)
[place] EMERGENCY DEPARTMENT AT [facility] Provider Note  CSN: [id number] Arrival date & time: 05/02/23 0441  Chief Complaint(s) Fall (Pt BIBA after an unwitnessed fall at SNF, was found face down beside his bed. Pt denies LOC or hitting his head. No blood thinners.Per medics facility reports at least 8 falls, unsure of the time span.)  HPI Patrick Buchanan is a 72 y.o. male with a past medical history listed below including left hemiparesis status post resection of a meningioma currently living in a rehab facility who presents to the emergency department after being found down by staff.  Patient is alert and oriented x 3.  States that he fell sometime yesterday afternoon after trying to crawl out of bed to get staff to go in the room and was down for several hours.  He reports that there might be a no in the system saying that he had fallen approximately 8 times but this is incorrect.  He denies any headache, neck pain (other than discomfort from the cervical collar),, chest pain, abdominal pain or extremity pain.  The history is provided by the patient.    Past Medical History Past Medical History:  Diagnosis Date   Allergy    hay fever   Blood transfusion without reported diagnosis    Cerebral aneurysm 1992   "leaking"; treated by Dr Newell Coral   Depression    Diabetes mellitus without complication (HCC)    type 2   GERD (gastroesophageal reflux disease)    past hx    Patient Active Problem List   Diagnosis Date Noted   Vasogenic brain edema (HCC) 04/28/2022   Acute metabolic encephalopathy 04/27/2022   Acute urinary retention 04/27/2022   Brain tumor (HCC) 02/27/2022   Meningioma (HCC) 02/27/2022   Spastic hemiparesis of left nondominant side (HCC) 01/06/2022   History of cerebral aneurysm 01/06/2022   Gait abnormality 01/06/2022   Mixed diabetic hyperlipidemia associated with type 2 diabetes mellitus (HCC) 11/18/2019   Diabetes mellitus (HCC) 11/18/2019    Counseling on health promotion and disease prevention 11/18/2019   History of smoking for 6-10 years 10/29/2019   Pain in right knee 08/25/2019   Vitamin D insufficiency 08/11/2019   Hypertriglyceridemia 08/11/2019   Prediabetes 08/11/2019   Serum sodium elevated 08/11/2019   dysthymia 10/28/2018   (BMI 30.0-34.9) 10/28/2018   Elevated blood pressure reading- no dx HTN 10/28/2018   Family history of diabetes mellitus (DM) 10/28/2018   Home Medication(s) Prior to Admission medications   Medication Sig Start Date End Date Taking? Authorizing Provider  acetaminophen (TYLENOL) 500 MG tablet Take 1,000 mg by mouth 2 (two) times daily.    [provider]  ALPRAZolam Prudy Feeler) 0.5 MG tablet Take 1 tablet (0.5 mg total) by mouth 2 (two) times daily as needed for anxiety. 05/13/22   Leroy Sea, MD  ARIPiprazole (ABILIFY) 10 MG tablet Take 10 mg by mouth daily. 06/09/22   [provider]  baclofen (LIORESAL) 10 MG tablet Take 1 tablet (10 mg total) by mouth 3 (three) times daily. 04/24/22   Carlean Jews, NP  bisacodyl (DULCOLAX) 10 MG suppository Place 1 suppository (10 mg total) rectally daily as needed for moderate constipation. 05/13/22   Leroy Sea, MD  cephALEXin (KEFLEX) 500 MG capsule Take 1 capsule (500 mg total) by mouth 3 (three) times daily. 09/02/22   Garlon Hatchet, PA-C  Cholecalciferol (VITAMIN D3 PO) Take 1 capsule by mouth daily.    [provider]  diclofenac Sodium (VOLTAREN) 1 % GEL Apply 2 g topically 3 (three) times daily. 03/30/22   Setzer, Lynnell Jude, PA-C  escitalopram (LEXAPRO) 20 MG tablet Take 1 tablet (20 mg total) by mouth daily. 04/24/22   Carlean Jews, NP  gabapentin (NEURONTIN) 100 MG capsule Take 1 capsule (100 mg total) by mouth 2 (two) times daily. 04/24/22   Carlean Jews, NP  gabapentin (NEURONTIN) 300 MG capsule Take 300 mg by mouth daily. 05/16/22   [provider]  gabapentin (NEURONTIN) 300 MG capsule Take 1  capsule (300 mg total) by mouth 3 (three) times daily for 5 days. 09/22/22 09/27/22  Terald Sleeper, MD  HYDROcodone-acetaminophen (NORCO/VICODIN) 5-325 MG tablet Take by mouth. 05/17/22   [provider]  ketoconazole (NIZORAL) 2 % cream Apply 1 Application topically daily. 06/14/22   [provider]  ketoconazole (NIZORAL) 2 % shampoo Apply 1 Application topically. Every 20 days 06/14/22   [provider]  Lacosamide 100 MG TABS Take 1 tablet (100 mg total) by mouth 2 (two) times daily. 01/02/23   Levert Feinstein, MD  lamoTRIgine (LAMICTAL) 100 MG tablet Take 1 tablet (100 mg total) by mouth 2 (two) times daily. 01/02/23   Levert Feinstein, MD  linaclotide [facility]) 145 MCG CAPS capsule Take 1 capsule (145 mcg total) by mouth daily before breakfast. 04/24/22   Carlean Jews, NP  magnesium gluconate (MAGONATE) 500 MG tablet Take 0.5 tablets (250 mg total) by mouth at bedtime. Patient taking differently: Take 250 mg by mouth every other day. 04/24/22   Carlean Jews, NP  magnesium hydroxide (MILK OF MAGNESIA) 400 MG/5ML suspension Take 30 mLs by mouth daily as needed for mild constipation.    [provider]  Multiple Vitamin (MULTIVITAMIN) tablet Take 1 tablet by mouth daily.    [provider]  pantoprazole (PROTONIX) 40 MG tablet Take 1 tablet (40 mg total) by mouth at bedtime. 04/24/22   Carlean Jews, NP  polyvinyl alcohol (LIQUIFILM TEARS) 1.4 % ophthalmic solution Place 1 drop into both eyes as needed for dry eyes.    [provider]  QUEtiapine (SEROQUEL) 50 MG tablet Take 1 tablet (50 mg total) by mouth at bedtime. 05/13/22   Leroy Sea, MD  rosuvastatin (CRESTOR) 20 MG tablet Take 1 tablet (20 mg total) by mouth daily. 04/24/22   Carlean Jews, NP  senna-docusate (SENOKOT-S) 8.6-50 MG tablet Take 2 tablets by mouth at bedtime. 03/30/22   Setzer, Lynnell Jude, PA-C  tamsulosin (FLOMAX) 0.4 MG CAPS capsule Take 1 capsule (0.4 mg total) by mouth  daily after supper. 05/13/22   Leroy Sea, MD  traZODone (DESYREL) 50 MG tablet Take 0.5-1 tablets (25-50 mg total) by mouth at bedtime as needed for sleep. 04/24/22   Carlean Jews, NP  Vitamin D, Ergocalciferol, (DRISDOL) 1.25 MG (50000 UNIT) CAPS capsule Take 1 capsule (50,000 Units total) by mouth every Sunday. Take one tablet wkly 04/30/22   Carlean Jews, NP  Allergies Other  Review of Systems Review of Systems As noted in HPI  Physical Exam Vital Signs  I have reviewed the triage vital signs BP 123/82 (BP Location: Left Arm)   Pulse 77   Temp 98.2 F (36.8 C) (Oral)   Resp 16   Ht 6' (1.829 m)   Wt 80.7 kg   SpO2 93%   BMI 24.14 kg/m   Physical Exam Vitals reviewed.  Constitutional:      General: He is not in acute distress.    Appearance: He is well-developed. He is not diaphoretic.     Interventions: Cervical collar in place.  HENT:     Head: Normocephalic and atraumatic.     Right Ear: External ear normal.     Left Ear: External ear normal.     Nose: Nose normal.     Mouth/Throat:     Mouth: Mucous membranes are moist.  Eyes:     General: No scleral icterus.    Conjunctiva/sclera: Conjunctivae normal.  Neck:     Trachea: Phonation normal.  Cardiovascular:     Rate and Rhythm: Normal rate and regular rhythm.  Pulmonary:     Effort: Pulmonary effort is normal. No respiratory distress.     Breath sounds: No stridor.  Abdominal:     General: There is no distension.  Musculoskeletal:        General: Normal range of motion.     Cervical back: Normal range of motion.  Skin:    Findings: Ecchymosis and petechiae (to BUE) present. No laceration.  Neurological:     Mental Status: He is alert and oriented to person, place, and time.  Psychiatric:        Behavior: Behavior normal.     ED Results and  Treatments Labs (all labs ordered are listed, but only abnormal results are displayed) Labs Reviewed  CBC WITH DIFFERENTIAL/PLATELET - Abnormal; Notable for the following components:      Result Value   Lymphs Abs 0.6 (*)    All other components within normal limits  COMPREHENSIVE METABOLIC PANEL - Abnormal; Notable for the following components:   Calcium 8.8 (*)    All other components within normal limits  CK  CBG MONITORING, ED                                                                                                                         EKG  EKG Interpretation Date/Time:  Wednesday May 02 2023 05:00:49 EDT Ventricular Rate:  77 PR Interval:  52 QRS Duration:  107 QT Interval:  400 QTC Calculation: 453 R Axis:   -61  Text Interpretation: Sinus rhythm Short PR interval Probable left atrial enlargement Abnormal R-wave progression, early transition Artifact Confirmed by Drema Pry ([id number]) on 05/02/2023 5:35:15 AM       Radiology No results found.  Medications Ordered in ED Medications - No data to display Procedures Procedures  (including critical care time) Medical Decision Making / ED  Course   Medical Decision Making Amount and/or Complexity of Data Reviewed Labs: ordered. Decision-making details documented in ED Course. Radiology: ordered and independent interpretation performed. Decision-making details documented in ED Course. ECG/medicine tests: ordered and independent interpretation performed. Decision-making details documented in ED Course.    Mechanical fall. Reported prolonged down time. Will assess for any acute injuries to the head and cervical spine.  No other injuries noted on exam requiring imaging.  Will assess for any electrolyte derangements, severe anemia, evidence of rhabdo myelitis.  Thus far CBC without leukocytosis or anemia. Metabolic panel without significant electrolyte derangements or renal sufficiency  CK normal CT of the  head without any acute changes.  Patient has multiple unchanged masses with vasogenic edema that also appears to be unchanged from prior. CT cervical spine negative.      Final Clinical Impression(s) / ED Diagnoses Final diagnoses:  Fall at nursing home, initial encounter   The patient appears reasonably screened and/or stabilized for discharge and I doubt any other medical condition or other [facility] requiring further screening, evaluation, or treatment in the ED at this time. I have discussed the findings, Dx and Tx plan with the patient/family who expressed understanding and agree(s) with the plan. Discharge instructions discussed at length. The patient/family was given strict return precautions who verbalized understanding of the instructions. No further questions at time of discharge.  Disposition: Discharge  Condition: Good  ED Discharge Orders     None       Follow Up: Mayer Masker, PA-C [address] [place] [address] [phone]  Call      This chart was dictated using voice recognition software.  Despite best efforts to proofread,  errors can occur which can change the documentation meaning.    Nira Conn, MD 05/02/23 [id number]    Nira Conn, MD 05/02/23 [phone]

## 2023-05-02 NOTE — ED Notes (Signed)
[facility] called at this time. All documents printed. Advised about an hour to and hour and half on pickup. Pt resting comfortably and in no distress.  

## 2023-05-02 NOTE — ED Triage Notes (Signed)
Pt BIBA after an unwitnessed fall at SNF, was found face down beside his bed. Pt denies LOC or hitting his head. No blood thinners.Per medics facility reports at least 8 falls, unsure of the time span. Generalized bruising to BUE/BLE and redness to right side of his face.C-Collar in place for stabilization. Per medics Pt has new onset of decreased sensations to BLE, L>R.

## 2023-05-07 DIAGNOSIS — G936 Cerebral edema: Secondary | ICD-10-CM | POA: Diagnosis not present

## 2023-05-07 DIAGNOSIS — F331 Major depressive disorder, recurrent, moderate: Secondary | ICD-10-CM | POA: Diagnosis not present

## 2023-05-07 DIAGNOSIS — R296 Repeated falls: Secondary | ICD-10-CM | POA: Diagnosis not present

## 2023-05-07 DIAGNOSIS — F4323 Adjustment disorder with mixed anxiety and depressed mood: Secondary | ICD-10-CM | POA: Diagnosis not present

## 2023-05-07 DIAGNOSIS — Z7409 Other reduced mobility: Secondary | ICD-10-CM | POA: Diagnosis not present

## 2023-05-07 DIAGNOSIS — R6889 Other general symptoms and signs: Secondary | ICD-10-CM | POA: Diagnosis not present

## 2023-05-07 DIAGNOSIS — F3289 Other specified depressive episodes: Secondary | ICD-10-CM | POA: Diagnosis not present

## 2023-05-07 DIAGNOSIS — G8110 Spastic hemiplegia affecting unspecified side: Secondary | ICD-10-CM | POA: Diagnosis not present

## 2023-05-11 DIAGNOSIS — G936 Cerebral edema: Secondary | ICD-10-CM | POA: Diagnosis not present

## 2023-05-11 DIAGNOSIS — R1310 Dysphagia, unspecified: Secondary | ICD-10-CM | POA: Diagnosis not present

## 2023-05-11 DIAGNOSIS — R2689 Other abnormalities of gait and mobility: Secondary | ICD-10-CM | POA: Diagnosis not present

## 2023-05-14 DIAGNOSIS — R1310 Dysphagia, unspecified: Secondary | ICD-10-CM | POA: Diagnosis not present

## 2023-05-14 DIAGNOSIS — G936 Cerebral edema: Secondary | ICD-10-CM | POA: Diagnosis not present

## 2023-05-14 DIAGNOSIS — R2689 Other abnormalities of gait and mobility: Secondary | ICD-10-CM | POA: Diagnosis not present

## 2023-05-15 DIAGNOSIS — G936 Cerebral edema: Secondary | ICD-10-CM | POA: Diagnosis not present

## 2023-05-15 DIAGNOSIS — R1310 Dysphagia, unspecified: Secondary | ICD-10-CM | POA: Diagnosis not present

## 2023-05-15 DIAGNOSIS — R2689 Other abnormalities of gait and mobility: Secondary | ICD-10-CM | POA: Diagnosis not present

## 2023-05-16 DIAGNOSIS — R2689 Other abnormalities of gait and mobility: Secondary | ICD-10-CM | POA: Diagnosis not present

## 2023-05-16 DIAGNOSIS — G936 Cerebral edema: Secondary | ICD-10-CM | POA: Diagnosis not present

## 2023-05-16 DIAGNOSIS — R1310 Dysphagia, unspecified: Secondary | ICD-10-CM | POA: Diagnosis not present

## 2023-05-17 DIAGNOSIS — R2689 Other abnormalities of gait and mobility: Secondary | ICD-10-CM | POA: Diagnosis not present

## 2023-05-17 DIAGNOSIS — G936 Cerebral edema: Secondary | ICD-10-CM | POA: Diagnosis not present

## 2023-05-17 DIAGNOSIS — R1310 Dysphagia, unspecified: Secondary | ICD-10-CM | POA: Diagnosis not present

## 2023-05-18 DIAGNOSIS — R2689 Other abnormalities of gait and mobility: Secondary | ICD-10-CM | POA: Diagnosis not present

## 2023-05-18 DIAGNOSIS — G936 Cerebral edema: Secondary | ICD-10-CM | POA: Diagnosis not present

## 2023-05-18 DIAGNOSIS — R1310 Dysphagia, unspecified: Secondary | ICD-10-CM | POA: Diagnosis not present

## 2023-05-21 DIAGNOSIS — G936 Cerebral edema: Secondary | ICD-10-CM | POA: Diagnosis not present

## 2023-05-21 DIAGNOSIS — R1310 Dysphagia, unspecified: Secondary | ICD-10-CM | POA: Diagnosis not present

## 2023-05-21 DIAGNOSIS — R2689 Other abnormalities of gait and mobility: Secondary | ICD-10-CM | POA: Diagnosis not present

## 2023-05-22 DIAGNOSIS — R1310 Dysphagia, unspecified: Secondary | ICD-10-CM | POA: Diagnosis not present

## 2023-05-22 DIAGNOSIS — R2689 Other abnormalities of gait and mobility: Secondary | ICD-10-CM | POA: Diagnosis not present

## 2023-05-22 DIAGNOSIS — G936 Cerebral edema: Secondary | ICD-10-CM | POA: Diagnosis not present

## 2023-05-22 DIAGNOSIS — F4323 Adjustment disorder with mixed anxiety and depressed mood: Secondary | ICD-10-CM | POA: Diagnosis not present

## 2023-05-22 DIAGNOSIS — F331 Major depressive disorder, recurrent, moderate: Secondary | ICD-10-CM | POA: Diagnosis not present

## 2023-05-23 DIAGNOSIS — G936 Cerebral edema: Secondary | ICD-10-CM | POA: Diagnosis not present

## 2023-05-23 DIAGNOSIS — R2689 Other abnormalities of gait and mobility: Secondary | ICD-10-CM | POA: Diagnosis not present

## 2023-05-23 DIAGNOSIS — I10 Essential (primary) hypertension: Secondary | ICD-10-CM | POA: Diagnosis not present

## 2023-05-23 DIAGNOSIS — R1310 Dysphagia, unspecified: Secondary | ICD-10-CM | POA: Diagnosis not present

## 2023-05-24 DIAGNOSIS — R1310 Dysphagia, unspecified: Secondary | ICD-10-CM | POA: Diagnosis not present

## 2023-05-24 DIAGNOSIS — G936 Cerebral edema: Secondary | ICD-10-CM | POA: Diagnosis not present

## 2023-05-24 DIAGNOSIS — R2689 Other abnormalities of gait and mobility: Secondary | ICD-10-CM | POA: Diagnosis not present

## 2023-05-25 DIAGNOSIS — R2689 Other abnormalities of gait and mobility: Secondary | ICD-10-CM | POA: Diagnosis not present

## 2023-05-25 DIAGNOSIS — R1310 Dysphagia, unspecified: Secondary | ICD-10-CM | POA: Diagnosis not present

## 2023-05-25 DIAGNOSIS — G936 Cerebral edema: Secondary | ICD-10-CM | POA: Diagnosis not present

## 2023-05-28 DIAGNOSIS — R2689 Other abnormalities of gait and mobility: Secondary | ICD-10-CM | POA: Diagnosis not present

## 2023-05-28 DIAGNOSIS — R1310 Dysphagia, unspecified: Secondary | ICD-10-CM | POA: Diagnosis not present

## 2023-05-28 DIAGNOSIS — G936 Cerebral edema: Secondary | ICD-10-CM | POA: Diagnosis not present

## 2023-05-29 DIAGNOSIS — R2689 Other abnormalities of gait and mobility: Secondary | ICD-10-CM | POA: Diagnosis not present

## 2023-05-29 DIAGNOSIS — R1310 Dysphagia, unspecified: Secondary | ICD-10-CM | POA: Diagnosis not present

## 2023-05-29 DIAGNOSIS — G936 Cerebral edema: Secondary | ICD-10-CM | POA: Diagnosis not present

## 2023-05-30 ENCOUNTER — Ambulatory Visit: Payer: Medicare HMO | Admitting: Neurology

## 2023-05-30 ENCOUNTER — Encounter: Payer: Self-pay | Admitting: Neurology

## 2023-05-30 ENCOUNTER — Ambulatory Visit (INDEPENDENT_AMBULATORY_CARE_PROVIDER_SITE_OTHER): Payer: Medicare HMO | Admitting: Neurology

## 2023-05-30 VITALS — BP 122/64

## 2023-05-30 DIAGNOSIS — G936 Cerebral edema: Secondary | ICD-10-CM | POA: Diagnosis not present

## 2023-05-30 DIAGNOSIS — G8194 Hemiplegia, unspecified affecting left nondominant side: Secondary | ICD-10-CM | POA: Diagnosis not present

## 2023-05-30 DIAGNOSIS — R1310 Dysphagia, unspecified: Secondary | ICD-10-CM | POA: Diagnosis not present

## 2023-05-30 DIAGNOSIS — R2689 Other abnormalities of gait and mobility: Secondary | ICD-10-CM | POA: Diagnosis not present

## 2023-05-30 MED ORDER — INCOBOTULINUMTOXINA 100 UNITS IM SOLR
400.0000 [IU] | Freq: Once | INTRAMUSCULAR | Status: AC
Start: 2023-05-30 — End: 2023-05-30
  Administered 2023-05-30: 400 [IU] via INTRAMUSCULAR

## 2023-05-30 NOTE — Progress Notes (Signed)
Chief Complaint  Patient presents with   xeomin    Rm 14, xeomin, with caregiver, no NSAIDS last 24 hours, consent signed      ASSESSMENT AND PLAN  Patrick Buchanan is a 72 y.o. male   History of parasagittal large size meningioma resection, presurgically, he had significant right frontal area vasogenic edema, still with residual multiple meningioma History of right brain aneurysm, status post right craniotomy in 1992 History of benign cervical subdural tumor resection in 2005     Complex partial seizure  Continue lamotrigine titrating to 100 mg twice a day,  Vimpat 100 twice a day    Left hemiparesis, lower extremity more than left upper extremity, Electrical stimulation guided xeomin injection for spastic left hemiparesis, used xeomin 400 units (100 units/dissolved into 2 cc normal saline)  Left pectoralis major 50 units Left latissimus dorsi 100 units Left pronator teres 50 units Left flexor digitorum profundus 50 units Left flexor digitorum superficialis 25 units Left brachialis 100 units Left brachioradialis 25 units   DIAGNOSTIC DATA (LABS, IMAGING, TESTING) - I reviewed patient records, labs, notes, testing and imaging myself where available.   MEDICAL HISTORY:  Patrick Buchanan, is a 72 year old male, seen in request by his primary care PA Mayer Masker, for evaluation of gradual onset of left side difficulty, unsteady gait, initial evaluation was on January 06, 2022.   I reviewed and summarized the referring note. PMHX Depression HLD Cerebral aneurysm surgery in 1992, presented with sudden onset severe headache Cervical sudural benign tumor,in 2005  presented with right arm weakness  He had a history of right brain aneurysm bleeding in 1992, presented with sudden onset severe headaches, status post right craniectomy by Dr. 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 at age 28, he also had  a history of cervical subdural benign tumor in 2005, presented with right arm weakness, surgical was successful, he denies any focal deficit  He went through very stressful 2022, his wife was diagnosed with pancreatic cancer in summer, and just passed away in Feb 08, 2023during that period of time, he was focused on his wife  Only afterwards, he noticed he has left leg difficulty, unsteady gait, denies left arm weakness, but on examination, he has noticeable spastic left upper and lower extremity weakness,  Over the past few months, he also describes intermittent uncontrollable left leg jerking movements lasting 2 to 3 minutes, mild confusion, but never spreading to left arm  He denies incontinence, denies right-sided involvement  UPDATE January 02 2023: Doing a CT angiogram study through 2023, he was found to have numerous partially calcified extra-axial mass, throughout both hemisphere, largest lesion on the right centered over the right frontal parietal region near the vertex, measuring 4 x 7 x 3 0.5 x 5.4 cm, significant mass effect on the underlying brain parenchyma with surrounding edema at the right frontal parietal lobe,  CT angiogram showed postsurgical change, clipping of right ICA terminus aneurysm with no evidence of recurrent aneurysm  He underwent pan bilateral parietal craniotomy with resection of parasagittal tumor greater than 6 cm in diameter, mesh cranioplasty 3 x 6 cm, by Dr. Julio Sicks  Surgical. pathology consistent with meningioma, WHO grade 1  Multiple postsurgical repeat CT head showed postsurgical change, continue evidence of homogeneous enhancing lesion likely meningioma involving left middle cranial fossa, 4.5 x 2.6 x 3.0 cm, probably involvement of left cavernous sinus with surrounding low-attenuation, presumably due to  vasogenic edema, inferior left parietal occipital region, measuring 3.4 x 2.9 x 4.0 cm, with surrounding edema,  CT of the cervical spine showed  status post laminectomy from C3 6 to T1, no acute abnormality  Laboratory evaluations in 2024, hemoglobin of 11.4, BMP showed calcium 8.4, otherwise normal,  Postsurgically, he was discharged to rehabilitation, then nursing home, profound left leg weakness, mild left arm weakness, significant left shoulder pain on passive movement,  He also complains of transient episode of confusion, lasting for few minutes, about once a month, suggestive of partial seizure, is already on lamotrigine 100 twice a day, Vimpat 100 mg twice a day, overall tolerating the medication well, also on polypharmacy, gabapentin and Abilify, trazodone,  UPDATE February 20 2023: He is accompanied by caregiver at today's visit, previous injection did relax his left arm some   PHYSICAL EXAM:   Vitals:   05/30/23 1036  BP: 122/64     PHYSICAL EXAMNIATION:  Gen: NAD, conversant, well nourised, well groomed                     Cardiovascular: Regular rate rhythm, no peripheral edema, warm, nontender. Eyes: Conjunctivae clear without exudates or hemorrhage Neck: Supple, no carotid bruits. Pulmonary: Clear to auscultation bilaterally   NEUROLOGICAL EXAM:  MENTAL STATUS: Speech/cognition: Tired looking elderly male in wheelchair, CRANIAL NERVES: CN II: Visual fields are full to confrontation. Pupils are round equal and briskly reactive to light. CN III, IV, VI: extraocular movement are normal. No ptosis. CN V: Facial sensation is intact to light touch CN VII: Face is symmetric with normal eye closure  CN VIII: Hearing is normal to causal conversation. CN IX, X: Phonation is normal. CN XI: Head turning and shoulder shrug are intact  MOTOR: Significant left shoulder pain upon passive movement, no antigravity movement, limited range of motion of left elbow, maximum extension 150 degree, okay to left hand grip, no antigravity movement of left lower extremity  REFLEXES: Brisk left upper and lower extremity  reflex  SENSORY: Intact to light touch, pinprick and vibratory sensation are intact in fingers and toes.  COORDINATION: There is no trunk or limb dysmetria noted.  GAIT/STANCE: Deferred  REVIEW OF SYSTEMS:  Full 14 system review of systems performed and notable only for as above All other review of systems were negative.   ALLERGIES: Allergies  Allergen Reactions   Other Itching and Other (See Comments)    Hay fever- Itchy eyes, runny nose, congestion- no breathing issues, however    HOME MEDICATIONS: Current Outpatient Medications  Medication Sig Dispense Refill   acetaminophen (TYLENOL) 500 MG tablet Take 1,000 mg by mouth 2 (two) times daily.     ALPRAZolam (XANAX) 0.5 MG tablet Take 1 tablet (0.5 mg total) by mouth 2 (two) times daily as needed for anxiety. 5 tablet 0   ARIPiprazole (ABILIFY) 10 MG tablet Take 10 mg by mouth daily.     baclofen (LIORESAL) 10 MG tablet Take 1 tablet (10 mg total) by mouth 3 (three) times daily. 90 each 1   bisacodyl (DULCOLAX) 10 MG suppository Place 1 suppository (10 mg total) rectally daily as needed for moderate constipation. 12 suppository 0   cephALEXin (KEFLEX) 500 MG capsule Take 1 capsule (500 mg total) by mouth 3 (three) times daily. 21 capsule 0   Cholecalciferol (VITAMIN D3 PO) Take 1 capsule by mouth daily.     diclofenac Sodium (VOLTAREN) 1 % GEL Apply 2 g topically 3 (three) times daily. 50  g 0   escitalopram (LEXAPRO) 20 MG tablet Take 1 tablet (20 mg total) by mouth daily. 30 tablet 2   gabapentin (NEURONTIN) 100 MG capsule Take 1 capsule (100 mg total) by mouth 2 (two) times daily. 60 capsule 2   gabapentin (NEURONTIN) 300 MG capsule Take 300 mg by mouth daily.     gabapentin (NEURONTIN) 300 MG capsule Take 1 capsule (300 mg total) by mouth 3 (three) times daily for 5 days. 15 capsule 0   HYDROcodone-acetaminophen (NORCO/VICODIN) 5-325 MG tablet Take by mouth.     ketoconazole (NIZORAL) 2 % cream Apply 1 Application topically  daily.     ketoconazole (NIZORAL) 2 % shampoo Apply 1 Application topically. Every 20 days     Lacosamide 100 MG TABS Take 1 tablet (100 mg total) by mouth 2 (two) times daily. 60 tablet 11   lamoTRIgine (LAMICTAL) 100 MG tablet Take 1 tablet (100 mg total) by mouth 2 (two) times daily. 60 tablet 11   linaclotide (LINZESS) 145 MCG CAPS capsule Take 1 capsule (145 mcg total) by mouth daily before breakfast. 30 capsule 2   magnesium gluconate (MAGONATE) 500 MG tablet Take 0.5 tablets (250 mg total) by mouth at bedtime. (Patient taking differently: Take 250 mg by mouth every other day.) 30 tablet 2   magnesium hydroxide (MILK OF MAGNESIA) 400 MG/5ML suspension Take 30 mLs by mouth daily as needed for mild constipation.     Multiple Vitamin (MULTIVITAMIN) tablet Take 1 tablet by mouth daily.     pantoprazole (PROTONIX) 40 MG tablet Take 1 tablet (40 mg total) by mouth at bedtime. 30 tablet 2   polyvinyl alcohol (LIQUIFILM TEARS) 1.4 % ophthalmic solution Place 1 drop into both eyes as needed for dry eyes.     QUEtiapine (SEROQUEL) 50 MG tablet Take 1 tablet (50 mg total) by mouth at bedtime.     rosuvastatin (CRESTOR) 20 MG tablet Take 1 tablet (20 mg total) by mouth daily. 90 tablet 3   senna-docusate (SENOKOT-S) 8.6-50 MG tablet Take 2 tablets by mouth at bedtime.     tamsulosin (FLOMAX) 0.4 MG CAPS capsule Take 1 capsule (0.4 mg total) by mouth daily after supper. 30 capsule    traZODone (DESYREL) 50 MG tablet Take 0.5-1 tablets (25-50 mg total) by mouth at bedtime as needed for sleep. 30 tablet 2   Vitamin D, Ergocalciferol, (DRISDOL) 1.25 MG (50000 UNIT) CAPS capsule Take 1 capsule (50,000 Units total) by mouth every Sunday. Take one tablet wkly 4 capsule 2   Current Facility-Administered Medications  Medication Dose Route Frequency Provider Last Rate Last Admin   incobotulinumtoxinA (XEOMIN) 100 units injection 400 Units  400 Units Intramuscular Once Levert Feinstein, MD        PAST MEDICAL  HISTORY: Past Medical History:  Diagnosis Date   Allergy    hay fever   Blood transfusion without reported diagnosis    Cerebral aneurysm 1992   "leaking"; treated by Dr Newell Coral   Depression    Diabetes mellitus without complication (HCC)    type 2   GERD (gastroesophageal reflux disease)    past hx     PAST SURGICAL HISTORY: Past Surgical History:  Procedure Laterality Date   APPENDECTOMY     CEREBRAL ANEURYSM REPAIR  1992   clipping by Dr Newell Coral; cannot have an MRI   CRANIOTOMY Bilateral 02/27/2022   Procedure: Craniotomy - bilateral - Parietal;  Surgeon: Julio Sicks, MD;  Location: [facility] OR;  Service: Neurosurgery;  Laterality: Bilateral;  HERNIA REPAIR N/A 1990   inguinal   INGUINAL HERNIA REPAIR     as a child and then another at 73 yrs old   TONSILLECTOMY     TUMOR EXCISION  2005   benign cervical    FAMILY HISTORY: Family History  Problem Relation Age of Onset   Breast cancer Mother    Diabetes Brother    Colon cancer Neg Hx    Colon polyps Neg Hx    Esophageal cancer Neg Hx    Rectal cancer Neg Hx    Stomach cancer Neg Hx     SOCIAL HISTORY: Social History   Socioeconomic History   Marital status: Widowed    Spouse name: Not on file   Number of children: 4   Years of education: Not on file   Highest education level: Not on file  Occupational History   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  Tobacco Use   Smoking status: Former    Current packs/day: 0.00    Average packs/day: 1 pack/day for 10.0 years (10.0 ttl pk-yrs)    Types: Cigarettes    Start date: 84    Quit date: 2005    Years since quitting: 19.6   Smokeless tobacco: Never  Vaping Use   Vaping status: Some Days  Substance and Sexual Activity   Alcohol use: Not Currently   Drug use: Not Currently   Sexual activity: Not Currently  Other Topics Concern   Not on file  Social History Narrative   Right handed   Widowed recently   Social Determinants of Health   Financial Resource Strain: Not  on file  Food Insecurity: Not on file  Transportation Needs: Not on file  Physical Activity: Not on file  Stress: Not on file  Social Connections: Not on file  Intimate Partner Violence: Not on file      Levert Feinstein, M.D. Ph.D.  [facility] [address] [place], [place] [address] Ph: [phone] Fax: [phone]  CC:  No referring provider defined for this encounter.  Mayer Masker, PA-C (Inactive)

## 2023-05-30 NOTE — Progress Notes (Signed)
xenomin 100units x 4 vial  [id number] Exp-09.2026 B/B  Bacteriostatic 0.9% Sodium Chloride- 8mL  [id number] Expiration: 11.01.2025 NDC: [id number] Dx: G81.94 WITNESSED UV:[id number]

## 2023-05-31 DIAGNOSIS — R2689 Other abnormalities of gait and mobility: Secondary | ICD-10-CM | POA: Diagnosis not present

## 2023-05-31 DIAGNOSIS — R1310 Dysphagia, unspecified: Secondary | ICD-10-CM | POA: Diagnosis not present

## 2023-05-31 DIAGNOSIS — G936 Cerebral edema: Secondary | ICD-10-CM | POA: Diagnosis not present

## 2023-06-01 DIAGNOSIS — R5381 Other malaise: Secondary | ICD-10-CM | POA: Diagnosis not present

## 2023-06-01 DIAGNOSIS — Z9189 Other specified personal risk factors, not elsewhere classified: Secondary | ICD-10-CM | POA: Diagnosis not present

## 2023-06-01 DIAGNOSIS — G8110 Spastic hemiplegia affecting unspecified side: Secondary | ICD-10-CM | POA: Diagnosis not present

## 2023-06-01 DIAGNOSIS — R2689 Other abnormalities of gait and mobility: Secondary | ICD-10-CM | POA: Diagnosis not present

## 2023-06-01 DIAGNOSIS — R1310 Dysphagia, unspecified: Secondary | ICD-10-CM | POA: Diagnosis not present

## 2023-06-01 DIAGNOSIS — G936 Cerebral edema: Secondary | ICD-10-CM | POA: Diagnosis not present

## 2023-06-04 DIAGNOSIS — G936 Cerebral edema: Secondary | ICD-10-CM | POA: Diagnosis not present

## 2023-06-04 DIAGNOSIS — R2689 Other abnormalities of gait and mobility: Secondary | ICD-10-CM | POA: Diagnosis not present

## 2023-06-04 DIAGNOSIS — R1310 Dysphagia, unspecified: Secondary | ICD-10-CM | POA: Diagnosis not present

## 2023-06-05 DIAGNOSIS — F4323 Adjustment disorder with mixed anxiety and depressed mood: Secondary | ICD-10-CM | POA: Diagnosis not present

## 2023-06-05 DIAGNOSIS — G936 Cerebral edema: Secondary | ICD-10-CM | POA: Diagnosis not present

## 2023-06-05 DIAGNOSIS — R1310 Dysphagia, unspecified: Secondary | ICD-10-CM | POA: Diagnosis not present

## 2023-06-05 DIAGNOSIS — R2689 Other abnormalities of gait and mobility: Secondary | ICD-10-CM | POA: Diagnosis not present

## 2023-06-05 DIAGNOSIS — F331 Major depressive disorder, recurrent, moderate: Secondary | ICD-10-CM | POA: Diagnosis not present

## 2023-06-06 DIAGNOSIS — R2689 Other abnormalities of gait and mobility: Secondary | ICD-10-CM | POA: Diagnosis not present

## 2023-06-06 DIAGNOSIS — G936 Cerebral edema: Secondary | ICD-10-CM | POA: Diagnosis not present

## 2023-06-06 DIAGNOSIS — R1310 Dysphagia, unspecified: Secondary | ICD-10-CM | POA: Diagnosis not present

## 2023-06-07 DIAGNOSIS — Z0189 Encounter for other specified special examinations: Secondary | ICD-10-CM | POA: Diagnosis not present

## 2023-06-07 DIAGNOSIS — F32A Depression, unspecified: Secondary | ICD-10-CM | POA: Diagnosis not present

## 2023-06-07 DIAGNOSIS — E119 Type 2 diabetes mellitus without complications: Secondary | ICD-10-CM | POA: Diagnosis not present

## 2023-06-07 DIAGNOSIS — R2689 Other abnormalities of gait and mobility: Secondary | ICD-10-CM | POA: Diagnosis not present

## 2023-06-07 DIAGNOSIS — G40909 Epilepsy, unspecified, not intractable, without status epilepticus: Secondary | ICD-10-CM | POA: Diagnosis not present

## 2023-06-07 DIAGNOSIS — G936 Cerebral edema: Secondary | ICD-10-CM | POA: Diagnosis not present

## 2023-06-07 DIAGNOSIS — R1310 Dysphagia, unspecified: Secondary | ICD-10-CM | POA: Diagnosis not present

## 2023-06-07 DIAGNOSIS — G8114 Spastic hemiplegia affecting left nondominant side: Secondary | ICD-10-CM | POA: Diagnosis not present

## 2023-06-07 DIAGNOSIS — F419 Anxiety disorder, unspecified: Secondary | ICD-10-CM | POA: Diagnosis not present

## 2023-06-07 DIAGNOSIS — L739 Follicular disorder, unspecified: Secondary | ICD-10-CM | POA: Diagnosis not present

## 2023-06-07 DIAGNOSIS — D329 Benign neoplasm of meninges, unspecified: Secondary | ICD-10-CM | POA: Diagnosis not present

## 2023-06-07 DIAGNOSIS — F639 Impulse disorder, unspecified: Secondary | ICD-10-CM | POA: Diagnosis not present

## 2023-06-08 DIAGNOSIS — G936 Cerebral edema: Secondary | ICD-10-CM | POA: Diagnosis not present

## 2023-06-08 DIAGNOSIS — R1310 Dysphagia, unspecified: Secondary | ICD-10-CM | POA: Diagnosis not present

## 2023-06-08 DIAGNOSIS — R2689 Other abnormalities of gait and mobility: Secondary | ICD-10-CM | POA: Diagnosis not present

## 2023-06-13 DIAGNOSIS — R2689 Other abnormalities of gait and mobility: Secondary | ICD-10-CM | POA: Diagnosis not present

## 2023-06-13 DIAGNOSIS — G936 Cerebral edema: Secondary | ICD-10-CM | POA: Diagnosis not present

## 2023-06-13 DIAGNOSIS — R1310 Dysphagia, unspecified: Secondary | ICD-10-CM | POA: Diagnosis not present

## 2023-06-14 DIAGNOSIS — G936 Cerebral edema: Secondary | ICD-10-CM | POA: Diagnosis not present

## 2023-06-14 DIAGNOSIS — R1310 Dysphagia, unspecified: Secondary | ICD-10-CM | POA: Diagnosis not present

## 2023-06-14 DIAGNOSIS — R2689 Other abnormalities of gait and mobility: Secondary | ICD-10-CM | POA: Diagnosis not present

## 2023-06-18 DIAGNOSIS — F411 Generalized anxiety disorder: Secondary | ICD-10-CM | POA: Diagnosis not present

## 2023-06-18 DIAGNOSIS — G936 Cerebral edema: Secondary | ICD-10-CM | POA: Diagnosis not present

## 2023-06-18 DIAGNOSIS — R2689 Other abnormalities of gait and mobility: Secondary | ICD-10-CM | POA: Diagnosis not present

## 2023-06-18 DIAGNOSIS — R634 Abnormal weight loss: Secondary | ICD-10-CM | POA: Diagnosis not present

## 2023-06-18 DIAGNOSIS — R1310 Dysphagia, unspecified: Secondary | ICD-10-CM | POA: Diagnosis not present

## 2023-06-19 DIAGNOSIS — R1310 Dysphagia, unspecified: Secondary | ICD-10-CM | POA: Diagnosis not present

## 2023-06-19 DIAGNOSIS — F4323 Adjustment disorder with mixed anxiety and depressed mood: Secondary | ICD-10-CM | POA: Diagnosis not present

## 2023-06-19 DIAGNOSIS — R2689 Other abnormalities of gait and mobility: Secondary | ICD-10-CM | POA: Diagnosis not present

## 2023-06-19 DIAGNOSIS — F331 Major depressive disorder, recurrent, moderate: Secondary | ICD-10-CM | POA: Diagnosis not present

## 2023-06-19 DIAGNOSIS — G936 Cerebral edema: Secondary | ICD-10-CM | POA: Diagnosis not present

## 2023-06-20 DIAGNOSIS — G936 Cerebral edema: Secondary | ICD-10-CM | POA: Diagnosis not present

## 2023-06-20 DIAGNOSIS — R1310 Dysphagia, unspecified: Secondary | ICD-10-CM | POA: Diagnosis not present

## 2023-06-20 DIAGNOSIS — R2689 Other abnormalities of gait and mobility: Secondary | ICD-10-CM | POA: Diagnosis not present

## 2023-06-21 DIAGNOSIS — R2689 Other abnormalities of gait and mobility: Secondary | ICD-10-CM | POA: Diagnosis not present

## 2023-06-21 DIAGNOSIS — R1310 Dysphagia, unspecified: Secondary | ICD-10-CM | POA: Diagnosis not present

## 2023-06-21 DIAGNOSIS — G936 Cerebral edema: Secondary | ICD-10-CM | POA: Diagnosis not present

## 2023-06-22 DIAGNOSIS — R1310 Dysphagia, unspecified: Secondary | ICD-10-CM | POA: Diagnosis not present

## 2023-06-22 DIAGNOSIS — G936 Cerebral edema: Secondary | ICD-10-CM | POA: Diagnosis not present

## 2023-06-22 DIAGNOSIS — R2689 Other abnormalities of gait and mobility: Secondary | ICD-10-CM | POA: Diagnosis not present

## 2023-06-25 DIAGNOSIS — R1310 Dysphagia, unspecified: Secondary | ICD-10-CM | POA: Diagnosis not present

## 2023-06-25 DIAGNOSIS — G936 Cerebral edema: Secondary | ICD-10-CM | POA: Diagnosis not present

## 2023-06-26 DIAGNOSIS — R1310 Dysphagia, unspecified: Secondary | ICD-10-CM | POA: Diagnosis not present

## 2023-06-26 DIAGNOSIS — G936 Cerebral edema: Secondary | ICD-10-CM | POA: Diagnosis not present

## 2023-06-27 DIAGNOSIS — R1310 Dysphagia, unspecified: Secondary | ICD-10-CM | POA: Diagnosis not present

## 2023-06-27 DIAGNOSIS — G936 Cerebral edema: Secondary | ICD-10-CM | POA: Diagnosis not present

## 2023-06-28 DIAGNOSIS — R1310 Dysphagia, unspecified: Secondary | ICD-10-CM | POA: Diagnosis not present

## 2023-06-28 DIAGNOSIS — G936 Cerebral edema: Secondary | ICD-10-CM | POA: Diagnosis not present

## 2023-06-29 DIAGNOSIS — G936 Cerebral edema: Secondary | ICD-10-CM | POA: Diagnosis not present

## 2023-06-29 DIAGNOSIS — R1310 Dysphagia, unspecified: Secondary | ICD-10-CM | POA: Diagnosis not present

## 2023-07-01 DIAGNOSIS — R1310 Dysphagia, unspecified: Secondary | ICD-10-CM | POA: Diagnosis not present

## 2023-07-01 DIAGNOSIS — G936 Cerebral edema: Secondary | ICD-10-CM | POA: Diagnosis not present

## 2023-07-02 ENCOUNTER — Telehealth: Payer: Self-pay | Admitting: Neurology

## 2023-07-02 DIAGNOSIS — G936 Cerebral edema: Secondary | ICD-10-CM | POA: Diagnosis not present

## 2023-07-02 DIAGNOSIS — R1310 Dysphagia, unspecified: Secondary | ICD-10-CM | POA: Diagnosis not present

## 2023-07-02 NOTE — Telephone Encounter (Signed)
REQUIRED PHONE NOTE: daughter called to confirm appointment details

## 2023-07-03 DIAGNOSIS — G936 Cerebral edema: Secondary | ICD-10-CM | POA: Diagnosis not present

## 2023-07-03 DIAGNOSIS — R1310 Dysphagia, unspecified: Secondary | ICD-10-CM | POA: Diagnosis not present

## 2023-07-04 ENCOUNTER — Ambulatory Visit: Payer: Medicare HMO | Admitting: Neurology

## 2023-07-04 DIAGNOSIS — R1310 Dysphagia, unspecified: Secondary | ICD-10-CM | POA: Diagnosis not present

## 2023-07-04 DIAGNOSIS — G936 Cerebral edema: Secondary | ICD-10-CM | POA: Diagnosis not present

## 2023-07-05 DIAGNOSIS — R1310 Dysphagia, unspecified: Secondary | ICD-10-CM | POA: Diagnosis not present

## 2023-07-05 DIAGNOSIS — G936 Cerebral edema: Secondary | ICD-10-CM | POA: Diagnosis not present

## 2023-07-09 DIAGNOSIS — G936 Cerebral edema: Secondary | ICD-10-CM | POA: Diagnosis not present

## 2023-07-09 DIAGNOSIS — R1310 Dysphagia, unspecified: Secondary | ICD-10-CM | POA: Diagnosis not present

## 2023-07-10 DIAGNOSIS — R1310 Dysphagia, unspecified: Secondary | ICD-10-CM | POA: Diagnosis not present

## 2023-07-10 DIAGNOSIS — G936 Cerebral edema: Secondary | ICD-10-CM | POA: Diagnosis not present

## 2023-07-11 DIAGNOSIS — R1310 Dysphagia, unspecified: Secondary | ICD-10-CM | POA: Diagnosis not present

## 2023-07-11 DIAGNOSIS — G936 Cerebral edema: Secondary | ICD-10-CM | POA: Diagnosis not present

## 2023-07-12 DIAGNOSIS — R1310 Dysphagia, unspecified: Secondary | ICD-10-CM | POA: Diagnosis not present

## 2023-07-12 DIAGNOSIS — G936 Cerebral edema: Secondary | ICD-10-CM | POA: Diagnosis not present

## 2023-07-13 DIAGNOSIS — R1310 Dysphagia, unspecified: Secondary | ICD-10-CM | POA: Diagnosis not present

## 2023-07-13 DIAGNOSIS — G936 Cerebral edema: Secondary | ICD-10-CM | POA: Diagnosis not present

## 2023-07-16 DIAGNOSIS — G936 Cerebral edema: Secondary | ICD-10-CM | POA: Diagnosis not present

## 2023-07-16 DIAGNOSIS — R1310 Dysphagia, unspecified: Secondary | ICD-10-CM | POA: Diagnosis not present

## 2023-07-17 DIAGNOSIS — G936 Cerebral edema: Secondary | ICD-10-CM | POA: Diagnosis not present

## 2023-07-17 DIAGNOSIS — R1310 Dysphagia, unspecified: Secondary | ICD-10-CM | POA: Diagnosis not present

## 2023-07-18 DIAGNOSIS — R1310 Dysphagia, unspecified: Secondary | ICD-10-CM | POA: Diagnosis not present

## 2023-07-18 DIAGNOSIS — G936 Cerebral edema: Secondary | ICD-10-CM | POA: Diagnosis not present

## 2023-07-19 DIAGNOSIS — G936 Cerebral edema: Secondary | ICD-10-CM | POA: Diagnosis not present

## 2023-07-19 DIAGNOSIS — R1310 Dysphagia, unspecified: Secondary | ICD-10-CM | POA: Diagnosis not present

## 2023-07-20 DIAGNOSIS — G936 Cerebral edema: Secondary | ICD-10-CM | POA: Diagnosis not present

## 2023-07-20 DIAGNOSIS — R1310 Dysphagia, unspecified: Secondary | ICD-10-CM | POA: Diagnosis not present

## 2023-09-05 ENCOUNTER — Encounter: Payer: Self-pay | Admitting: Neurology

## 2023-09-05 ENCOUNTER — Ambulatory Visit (INDEPENDENT_AMBULATORY_CARE_PROVIDER_SITE_OTHER): Payer: Medicare HMO | Admitting: Neurology

## 2023-09-05 VITALS — BP 128/62 | Ht 72.0 in | Wt 150.0 lb

## 2023-09-05 DIAGNOSIS — G8194 Hemiplegia, unspecified affecting left nondominant side: Secondary | ICD-10-CM | POA: Diagnosis not present

## 2023-09-05 DIAGNOSIS — G8114 Spastic hemiplegia affecting left nondominant side: Secondary | ICD-10-CM | POA: Diagnosis not present

## 2023-09-05 NOTE — Progress Notes (Signed)
xeomin 100 units x 4 vial  [id number] Exp-12.2026 B/B Bacteriostatic 0.9% Sodium Chloride- 8 mL  [id number] Expiration: 04.01.2025 NDC: [id number] Dx: G81.94 WITNESSED BY: Haleigh RMA

## 2023-09-05 NOTE — Progress Notes (Signed)
Chief Complaint  Patient presents with   xeomin    Rm 14 xeomin, facility to fax over medication list.      ASSESSMENT AND PLAN  Patrick Buchanan is a 72 y.o. male   History of parasagittal large size meningioma resection, presurgically, he had significant right frontal area vasogenic edema, still with residual multiple meningioma History of right brain aneurysm, status post right craniotomy in 1992 History of benign cervical subdural tumor resection in 2005     Complex partial seizure  Continue lamotrigine titrating to 100 mg twice a day,  Vimpat 100 twice a day    Left hemiparesis, lower extremity more than left upper extremity, Electrical stimulation guided xeomin injection for spastic left hemiparesis, used xeomin 400 units (100 units/dissolved into 2 cc normal saline)  Left pectoralis major 50 units Left latissimus dorsi 100 units Left pronator teres 50 units Left flexor digitorum profundus 50 units Left flexor digitorum superficialis 25 units Left brachialis 100 units Left brachioradialis 25 units   DIAGNOSTIC DATA (LABS, IMAGING, TESTING) - I reviewed patient records, labs, notes, testing and imaging myself where available.   MEDICAL HISTORY:  Patrick Buchanan, is a 72 year old male, seen in request by his primary care PA Mayer Masker, for evaluation of gradual onset of left side difficulty, unsteady gait, initial evaluation was on January 06, 2022.   I reviewed and summarized the referring note. PMHX Depression HLD Cerebral aneurysm surgery in 1992, presented with sudden onset severe headache Cervical sudural benign tumor,in 2005  presented with right arm weakness  He had a history of right brain aneurysm bleeding in 1992, presented with sudden onset severe headaches, status post right craniectomy by Dr. 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 at age 80, he also had a history of  cervical subdural benign tumor in 2005, presented with right arm weakness, surgical was successful, he denies any focal deficit  He went through very stressful 2022, his wife was diagnosed with pancreatic cancer in summer, and just passed away in 2023-02-22during that period of time, he was focused on his wife  Only afterwards, he noticed he has left leg difficulty, unsteady gait, denies left arm weakness, but on examination, he has noticeable spastic left upper and lower extremity weakness,  Over the past few months, he also describes intermittent uncontrollable left leg jerking movements lasting 2 to 3 minutes, mild confusion, but never spreading to left arm  He denies incontinence, denies right-sided involvement  UPDATE January 02 2023: Doing a CT angiogram study through 2023, he was found to have numerous partially calcified extra-axial mass, throughout both hemisphere, largest lesion on the right centered over the right frontal parietal region near the vertex, measuring 4 x 7 x 3 0.5 x 5.4 cm, significant mass effect on the underlying brain parenchyma with surrounding edema at the right frontal parietal lobe,  CT angiogram showed postsurgical change, clipping of right ICA terminus aneurysm with no evidence of recurrent aneurysm  He underwent pan bilateral parietal craniotomy with resection of parasagittal tumor greater than 6 cm in diameter, mesh cranioplasty 3 x 6 cm, by Dr. Julio Sicks  Surgical. pathology consistent with meningioma, WHO grade 1  Multiple postsurgical repeat CT head showed postsurgical change, continue evidence of homogeneous enhancing lesion likely meningioma involving left middle cranial fossa, 4.5 x 2.6 x 3.0 cm, probably involvement of left cavernous sinus with surrounding low-attenuation, presumably due to vasogenic edema, inferior  left parietal occipital region, measuring 3.4 x 2.9 x 4.0 cm, with surrounding edema,  CT of the cervical spine showed status post  laminectomy from C3 6 to T1, no acute abnormality  Laboratory evaluations in 2024, hemoglobin of 11.4, BMP showed calcium 8.4, otherwise normal,  Postsurgically, he was discharged to rehabilitation, then nursing home, profound left leg weakness, mild left arm weakness, significant left shoulder pain on passive movement,  He also complains of transient episode of confusion, lasting for few minutes, about once a month, suggestive of partial seizure, is already on lamotrigine 100 twice a day, Vimpat 100 mg twice a day, overall tolerating the medication well, also on polypharmacy, gabapentin and Abilify, trazodone,  UPDATE February 20 2023: He is accompanied by caregiver at today's visit, previous injection did relax his left arm some  UPDATE Sep 05 2023: Previous injection did help his left upper extremity spasticity and pain   PHYSICAL EXAM:   Vitals:   09/05/23 1441  BP: 128/62  Weight: 150 lb (68 kg)  Height: 6' (1.829 m)     PHYSICAL EXAMNIATION:  Gen: NAD, conversant, well nourised, well groomed                     Cardiovascular: Regular rate rhythm, no peripheral edema, warm, nontender. Eyes: Conjunctivae clear without exudates or hemorrhage Neck: Supple, no carotid bruits. Pulmonary: Clear to auscultation bilaterally   NEUROLOGICAL EXAM:  MENTAL STATUS: Speech/cognition: Tired looking elderly male in wheelchair, CRANIAL NERVES: CN II: Visual fields are full to confrontation. Pupils are round equal and briskly reactive to light. CN III, IV, VI: extraocular movement are normal. No ptosis. CN V: Facial sensation is intact to light touch CN VII: Face is symmetric with normal eye closure  CN VIII: Hearing is normal to causal conversation. CN IX, X: Phonation is normal. CN XI: Head turning and shoulder shrug are intact  MOTOR: Significant left shoulder pain upon passive movement, no antigravity movement, limited range of motion of left elbow, maximum extension 150 degree,  okay to left hand grip, no antigravity movement of left lower extremity  REFLEXES: Brisk left upper and lower extremity reflex  SENSORY: Intact to light touch, pinprick and vibratory sensation are intact in fingers and toes.  COORDINATION: There is no trunk or limb dysmetria noted.  GAIT/STANCE: Deferred  REVIEW OF SYSTEMS:  Full 14 system review of systems performed and notable only for as above All other review of systems were negative.   ALLERGIES: Allergies  Allergen Reactions   Other Itching and Other (See Comments)    Hay fever- Itchy eyes, runny nose, congestion- no breathing issues, however    HOME MEDICATIONS: Current Outpatient Medications  Medication Sig Dispense Refill   acetaminophen (TYLENOL) 500 MG tablet Take 1,000 mg by mouth 2 (two) times daily.     ALPRAZolam (XANAX) 0.5 MG tablet Take 1 tablet (0.5 mg total) by mouth 2 (two) times daily as needed for anxiety. 5 tablet 0   ARIPiprazole (ABILIFY) 10 MG tablet Take 10 mg by mouth daily.     baclofen (LIORESAL) 10 MG tablet Take 1 tablet (10 mg total) by mouth 3 (three) times daily. 90 each 1   bisacodyl (DULCOLAX) 10 MG suppository Place 1 suppository (10 mg total) rectally daily as needed for moderate constipation. 12 suppository 0   cephALEXin (KEFLEX) 500 MG capsule Take 1 capsule (500 mg total) by mouth 3 (three) times daily. 21 capsule 0   Cholecalciferol (VITAMIN D3 PO) Take  1 capsule by mouth daily.     diclofenac Sodium (VOLTAREN) 1 % GEL Apply 2 g topically 3 (three) times daily. 50 g 0   escitalopram (LEXAPRO) 20 MG tablet Take 1 tablet (20 mg total) by mouth daily. 30 tablet 2   gabapentin (NEURONTIN) 100 MG capsule Take 1 capsule (100 mg total) by mouth 2 (two) times daily. 60 capsule 2   gabapentin (NEURONTIN) 300 MG capsule Take 300 mg by mouth daily.     gabapentin (NEURONTIN) 300 MG capsule Take 1 capsule (300 mg total) by mouth 3 (three) times daily for 5 days. 15 capsule 0    HYDROcodone-acetaminophen (NORCO/VICODIN) 5-325 MG tablet Take by mouth.     ketoconazole (NIZORAL) 2 % cream Apply 1 Application topically daily.     ketoconazole (NIZORAL) 2 % shampoo Apply 1 Application topically. Every 20 days     Lacosamide 100 MG TABS Take 1 tablet (100 mg total) by mouth 2 (two) times daily. 60 tablet 11   lamoTRIgine (LAMICTAL) 100 MG tablet Take 1 tablet (100 mg total) by mouth 2 (two) times daily. 60 tablet 11   linaclotide (LINZESS) 145 MCG CAPS capsule Take 1 capsule (145 mcg total) by mouth daily before breakfast. 30 capsule 2   magnesium gluconate (MAGONATE) 500 MG tablet Take 0.5 tablets (250 mg total) by mouth at bedtime. (Patient taking differently: Take 250 mg by mouth every other day.) 30 tablet 2   magnesium hydroxide (MILK OF MAGNESIA) 400 MG/5ML suspension Take 30 mLs by mouth daily as needed for mild constipation.     Multiple Vitamin (MULTIVITAMIN) tablet Take 1 tablet by mouth daily.     pantoprazole (PROTONIX) 40 MG tablet Take 1 tablet (40 mg total) by mouth at bedtime. 30 tablet 2   polyvinyl alcohol (LIQUIFILM TEARS) 1.4 % ophthalmic solution Place 1 drop into both eyes as needed for dry eyes.     QUEtiapine (SEROQUEL) 50 MG tablet Take 1 tablet (50 mg total) by mouth at bedtime.     rosuvastatin (CRESTOR) 20 MG tablet Take 1 tablet (20 mg total) by mouth daily. 90 tablet 3   senna-docusate (SENOKOT-S) 8.6-50 MG tablet Take 2 tablets by mouth at bedtime.     tamsulosin (FLOMAX) 0.4 MG CAPS capsule Take 1 capsule (0.4 mg total) by mouth daily after supper. 30 capsule    traZODone (DESYREL) 50 MG tablet Take 0.5-1 tablets (25-50 mg total) by mouth at bedtime as needed for sleep. 30 tablet 2   Vitamin D, Ergocalciferol, (DRISDOL) 1.25 MG (50000 UNIT) CAPS capsule Take 1 capsule (50,000 Units total) by mouth every Sunday. Take one tablet wkly 4 capsule 2   No current facility-administered medications for this visit.    PAST MEDICAL HISTORY: Past Medical  History:  Diagnosis Date   Allergy    hay fever   Blood transfusion without reported diagnosis    Cerebral aneurysm 1992   "leaking"; treated by Dr Newell Coral   Depression    Diabetes mellitus without complication (HCC)    type 2   GERD (gastroesophageal reflux disease)    past hx     PAST SURGICAL HISTORY: Past Surgical History:  Procedure Laterality Date   APPENDECTOMY     CEREBRAL ANEURYSM REPAIR  1992   clipping by Dr Newell Coral; cannot have an MRI   CRANIOTOMY Bilateral 02/27/2022   Procedure: Craniotomy - bilateral - Parietal;  Surgeon: Julio Sicks, MD;  Location: [facility] OR;  Service: Neurosurgery;  Laterality: Bilateral;   HERNIA  REPAIR N/A 1990   inguinal   INGUINAL HERNIA REPAIR     as a child and then another at 65 yrs old   TONSILLECTOMY     TUMOR EXCISION  2005   benign cervical    FAMILY HISTORY: Family History  Problem Relation Age of Onset   Breast cancer Mother    Diabetes Brother    Colon cancer Neg Hx    Colon polyps Neg Hx    Esophageal cancer Neg Hx    Rectal cancer Neg Hx    Stomach cancer Neg Hx     SOCIAL HISTORY: Social History   Socioeconomic History   Marital status: Widowed    Spouse name: Not on file   Number of children: 4   Years of education: Not on file   Highest education level: Not on file  Occupational History   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  Tobacco Use   Smoking status: Former    Current packs/day: 0.00    Average packs/day: 1 pack/day for 10.0 years (10.0 ttl pk-yrs)    Types: Cigarettes    Start date: 9    Quit date: 2005    Years since quitting: 19.8   Smokeless tobacco: Never  Vaping Use   Vaping status: Some Days  Substance and Sexual Activity   Alcohol use: Not Currently   Drug use: Not Currently   Sexual activity: Not Currently  Other Topics Concern   Not on file  Social History Narrative   Right handed   Widowed recently   Social Determinants of Health   Financial Resource Strain: Not on file  Food  Insecurity: Not on file  Transportation Needs: Not on file  Physical Activity: Not on file  Stress: Not on file  Social Connections: Not on file  Intimate Partner Violence: Not on file      Levert Feinstein, M.D. Ph.D.  [facility] [address] [place], [place] [address] Ph: [phone] Fax: [phone]  CC:  No referring provider defined for this encounter.  Mayer Masker, PA-C (Inactive)

## 2023-09-11 DIAGNOSIS — G8194 Hemiplegia, unspecified affecting left nondominant side: Secondary | ICD-10-CM | POA: Diagnosis not present

## 2023-09-11 MED ORDER — INCOBOTULINUMTOXINA 100 UNITS IM SOLR
400.0000 [IU] | Freq: Once | INTRAMUSCULAR | Status: AC
Start: 2023-09-11 — End: 2023-09-11
  Administered 2023-09-11: 400 [IU] via INTRAMUSCULAR

## 2023-09-15 ENCOUNTER — Emergency Department: Payer: Medicare HMO

## 2023-09-15 ENCOUNTER — Emergency Department
Admission: EM | Admit: 2023-09-15 | Discharge: 2023-09-16 | Disposition: A | Discharge status: Home / Self Care | Payer: Medicare HMO | Attending: Emergency Medicine | Admitting: Emergency Medicine

## 2023-09-15 ENCOUNTER — Other Ambulatory Visit: Payer: Self-pay

## 2023-09-15 DIAGNOSIS — E876 Hypokalemia: Secondary | ICD-10-CM | POA: Insufficient documentation

## 2023-09-15 DIAGNOSIS — F4324 Adjustment disorder with disturbance of conduct: Secondary | ICD-10-CM | POA: Insufficient documentation

## 2023-09-15 DIAGNOSIS — R4182 Altered mental status, unspecified: Secondary | ICD-10-CM | POA: Insufficient documentation

## 2023-09-15 DIAGNOSIS — E119 Type 2 diabetes mellitus without complications: Secondary | ICD-10-CM | POA: Diagnosis not present

## 2023-09-15 DIAGNOSIS — R4689 Other symptoms and signs involving appearance and behavior: Secondary | ICD-10-CM | POA: Diagnosis present

## 2023-09-15 DIAGNOSIS — D420 Neoplasm of uncertain behavior of cerebral meninges: Secondary | ICD-10-CM | POA: Diagnosis not present

## 2023-09-15 LAB — COMPREHENSIVE METABOLIC PANEL
ALT: 28 U/L (ref 0–44)
AST: 27 U/L (ref 15–41)
Albumin: 3 g/dL — ABNORMAL LOW (ref 3.5–5.0)
Alkaline Phosphatase: 102 U/L (ref 38–126)
Anion gap: 9 (ref 5–15)
BUN: 17 mg/dL (ref 8–23)
CO2: 28 mmol/L (ref 22–32)
Calcium: 8.6 mg/dL — ABNORMAL LOW (ref 8.9–10.3)
Chloride: 106 mmol/L (ref 98–111)
Creatinine, Ser: 0.58 mg/dL — ABNORMAL LOW (ref 0.61–1.24)
GFR, Estimated: >60 mL/min (ref >60)
Glucose, Bld: 97 mg/dL (ref 70–99)
Potassium: 3.3 mmol/L — ABNORMAL LOW (ref 3.5–5.1)
Sodium: 143 mmol/L (ref 135–145)
Total Bilirubin: 0.8 mg/dL (ref <1.2)
Total Protein: 5.5 g/dL — ABNORMAL LOW (ref 6.5–8.1)

## 2023-09-15 LAB — URINALYSIS, ROUTINE W REFLEX MICROSCOPIC
Bilirubin Urine: NEGATIVE
Glucose, UA: NEGATIVE mg/dL
Hgb urine dipstick: NEGATIVE
Ketones, ur: 20 mg/dL — AB
Leukocytes,Ua: NEGATIVE
Nitrite: NEGATIVE
Protein, ur: NEGATIVE mg/dL
Specific Gravity, Urine: 1.02 (ref 1.005–1.030)
pH: 8 (ref 5.0–8.0)

## 2023-09-15 LAB — ACETAMINOPHEN LEVEL: Acetaminophen (Tylenol), Serum: <10 ug/mL — ABNORMAL LOW (ref 10–30)

## 2023-09-15 LAB — CBC
HCT: 41.9 % (ref 39.0–52.0)
Hemoglobin: 13.6 g/dL (ref 13.0–17.0)
MCH: 31.6 pg (ref 26.0–34.0)
MCHC: 32.5 g/dL (ref 30.0–36.0)
MCV: 97.4 fL (ref 80.0–100.0)
Platelets: 217 10*3/uL (ref 150–400)
RBC: 4.3 MIL/uL (ref 4.22–5.81)
RDW: 14.5 % (ref 11.5–15.5)
WBC: 6.1 10*3/uL (ref 4.0–10.5)
nRBC: 0 % (ref 0.0–0.2)

## 2023-09-15 LAB — TROPONIN I (HIGH SENSITIVITY): Troponin I (High Sensitivity): 10 ng/L (ref <18)

## 2023-09-15 LAB — SALICYLATE LEVEL: Salicylate Lvl: <7 mg/dL — ABNORMAL LOW (ref 7.0–30.0)

## 2023-09-15 LAB — ETHANOL: Alcohol, Ethyl (B): <10 mg/dL (ref <10)

## 2023-09-15 MED ORDER — BACLOFEN 10 MG PO TABS: 10.0000 mg | ORAL_TABLET | Freq: Three times a day (TID) | ORAL | Status: DC | PRN

## 2023-09-15 MED ORDER — MAGNESIUM GLUCONATE 500 MG PO TABS
250.0000 mg | ORAL_TABLET | ORAL | Status: DC
  Administered 2023-09-16: 250 mg via ORAL
  Filled 2023-09-15: qty 1

## 2023-09-15 MED ORDER — SODIUM CHLORIDE 0.9 % IV BOLUS
500.0000 mL | Freq: Once | INTRAVENOUS | Status: AC
  Administered 2023-09-15: 500 mL via INTRAVENOUS

## 2023-09-15 MED ORDER — PANTOPRAZOLE SODIUM 40 MG PO TBEC
40.0000 mg | DELAYED_RELEASE_TABLET | Freq: Every day | ORAL | Status: DC
  Administered 2023-09-15: 40 mg via ORAL
  Filled 2023-09-15: qty 1

## 2023-09-15 MED ORDER — LAMOTRIGINE 100 MG PO TABS
100.0000 mg | ORAL_TABLET | Freq: Two times a day (BID) | ORAL | Status: DC
  Administered 2023-09-15 – 2023-09-16 (×3): 100 mg via ORAL
  Filled 2023-09-15 (×3): qty 1

## 2023-09-15 MED ORDER — HYDROCODONE-ACETAMINOPHEN 5-325 MG PO TABS
1.0000 | ORAL_TABLET | Freq: Two times a day (BID) | ORAL | Status: DC | PRN
  Administered 2023-09-16: 1 via ORAL
  Filled 2023-09-15: qty 1

## 2023-09-15 MED ORDER — ESCITALOPRAM OXALATE 10 MG PO TABS
20.0000 mg | ORAL_TABLET | Freq: Every day | ORAL | Status: DC
  Administered 2023-09-15 – 2023-09-16 (×2): 20 mg via ORAL
  Filled 2023-09-15 (×2): qty 2

## 2023-09-15 MED ORDER — LINACLOTIDE 145 MCG PO CAPS
145.0000 ug | ORAL_CAPSULE | Freq: Every day | ORAL | Status: DC
  Administered 2023-09-16: 145 ug via ORAL
  Filled 2023-09-15: qty 1

## 2023-09-15 MED ORDER — BISACODYL 10 MG RE SUPP: 10.0000 mg | Freq: Every day | RECTAL | Status: DC | PRN

## 2023-09-15 MED ORDER — POTASSIUM CHLORIDE CRYS ER 20 MEQ PO TBCR
40.0000 meq | EXTENDED_RELEASE_TABLET | Freq: Once | ORAL | Status: AC
  Administered 2023-09-15: 40 meq via ORAL
  Filled 2023-09-15: qty 2

## 2023-09-15 MED ORDER — GABAPENTIN 100 MG PO CAPS
100.0000 mg | ORAL_CAPSULE | Freq: Three times a day (TID) | ORAL | Status: DC
  Administered 2023-09-15 – 2023-09-16 (×3): 100 mg via ORAL
  Filled 2023-09-15 (×3): qty 1

## 2023-09-15 MED ORDER — TAMSULOSIN HCL 0.4 MG PO CAPS
0.4000 mg | ORAL_CAPSULE | Freq: Every day | ORAL | Status: DC
  Administered 2023-09-15: 0.4 mg via ORAL
  Filled 2023-09-15: qty 1

## 2023-09-15 MED ORDER — POLYETHYLENE GLYCOL 3350 17 G PO PACK: 17.0000 g | PACK | Freq: Every day | ORAL | Status: DC | PRN

## 2023-09-15 MED ORDER — LACOSAMIDE 50 MG PO TABS
100.0000 mg | ORAL_TABLET | Freq: Two times a day (BID) | ORAL | Status: DC
  Administered 2023-09-15 – 2023-09-16 (×3): 100 mg via ORAL
  Filled 2023-09-15 (×4): qty 2

## 2023-09-15 MED ORDER — ALPRAZOLAM 0.5 MG PO TABS: 0.5000 mg | ORAL_TABLET | Freq: Two times a day (BID) | ORAL | Status: DC | PRN

## 2023-09-15 MED ORDER — TRAZODONE HCL 50 MG PO TABS
150.0000 mg | ORAL_TABLET | Freq: Every day | ORAL | Status: DC
  Administered 2023-09-15: 150 mg via ORAL
  Filled 2023-09-15: qty 1

## 2023-09-15 MED ORDER — ZIPRASIDONE HCL 20 MG PO CAPS
20.0000 mg | ORAL_CAPSULE | Freq: Two times a day (BID) | ORAL | Status: DC
  Administered 2023-09-15 – 2023-09-16 (×3): 20 mg via ORAL
  Filled 2023-09-15 (×5): qty 1

## 2023-09-15 MED ORDER — BREXPIPRAZOLE 0.25 MG PO TABS
0.5000 mg | ORAL_TABLET | Freq: Every day | ORAL | Status: DC
  Administered 2023-09-16: 0.5 mg via ORAL
  Filled 2023-09-15: qty 2

## 2023-09-15 MED ORDER — ROSUVASTATIN CALCIUM 20 MG PO TABS
20.0000 mg | ORAL_TABLET | Freq: Every day | ORAL | Status: DC
  Administered 2023-09-16: 20 mg via ORAL
  Filled 2023-09-15: qty 1

## 2023-09-15 NOTE — ED Notes (Signed)
Pt pulled IV out with teeth. Before I could intervene, pt had 3/4 of the IV out.

## 2023-09-15 NOTE — BH Assessment (Signed)
Comprehensive Clinical Assessment (CCA) Note  09/15/2023 Patrick Buchanan [id number]  Chief Complaint:  Chief Complaint  Patient presents with   behavioral disturbance    TTS unable to gather information from Mr. Fansler.  TTS attempted to contact [facility] skilled nursing facility [phone] multiple times.  There was no answer.  TTS spoke with daughter Patrick Buchanan - [phone].  She reports, "He was being combative and wanted to go to the emergency room.  He had 4 brain tumors, 2 were removed that left him paralyzed and 2 are remaining. He has a history of delusions and he will get agitated.  He was transported yesterday to the new facility.  "We don't know if it was due to him being in a new place".  He was at prior placement for a month, and prior to that he was in a placement for about a year.    Visit Diagnosis:  Adjustment Disorder   CCA Screening, Triage and Referral (STR)  Patient Reported Information How did you hear about [place]? No data recorded Referral name: No data recorded Referral phone number: No data recorded  Whom do you see for routine medical problems? No data recorded Practice/Facility Name: No data recorded Practice/Facility Phone Number: No data recorded Name of Contact: No data recorded Contact Number: No data recorded Contact Fax Number: No data recorded Prescriber Name: No data recorded Prescriber Address (if known): No data recorded  What Is the Reason for Your Visit/Call Today? No data recorded How Long Has This Been Causing You Problems? No data recorded What Do You Feel Would Help You the Most Today? No data recorded  Have You Recently Been in Any Inpatient Treatment (Hospital/Detox/Crisis Center/28-Day Program)? No data recorded Name/Location of Program/Hospital:No data recorded How Long Were You There? No data recorded When Were You Discharged? No data recorded  Have You Ever Received Services From [facility] Before? No data  recorded Who Do You See at [facility]? No data recorded  Have You Recently Had Any Thoughts About Hurting Yourself? No data recorded Are You Planning to Commit Suicide/Harm Yourself At This time? No data recorded  Have you Recently Had Thoughts About Hurting Someone Karolee Ohs? No data recorded Explanation: No data recorded  Have You Used Any Alcohol or Drugs in the Past 24 Hours? No data recorded How Long Ago Did You Use Drugs or Alcohol? No data recorded What Did You Use and How Much? No data recorded  Do You Currently Have a Therapist/Psychiatrist? No data recorded Name of Therapist/Psychiatrist: No data recorded  Have You Been Recently Discharged From Any Office Practice or Programs? No data recorded Explanation of Discharge From Practice/Program: No data recorded    CCA Screening Triage Referral Assessment Type of Contact: Face-to-Face  Is this Initial or Reassessment? No data recorded Date Telepsych consult ordered in [facility]:  No data recorded Time Telepsych consult ordered in [facility]:  No data recorded  Patient Reported Information Reviewed? No data recorded Patient Left Without Being Seen? No data recorded Reason for Not Completing Assessment: Patient was not able to provide information   Collateral Involvement: No data recorded  Does Patient Have a Court Appointed Legal Guardian? No data recorded Name and Contact of Legal Guardian: No data recorded If Minor and Not Living with Parent(s), Who has Custody? No data recorded Is CPS involved or ever been involved? No data recorded Is APS involved or ever been involved? No data recorded  Patient Determined To Be At Risk for Harm To Self  or Others Based on Review of Patient Reported Information or Presenting Complaint? No data recorded Method: No data recorded Availability of Means: No data recorded Intent: No data recorded Notification Required: No data recorded Additional Information for Danger to Others Potential: No data  recorded Additional Comments for Danger to Others Potential: No data recorded Are There Guns or Other Weapons in Your Home? No data recorded Types of Guns/Weapons: No data recorded Are These Weapons Safely Secured?                            No data recorded Who Could Verify You Are Able To Have These Secured: No data recorded Do You Have any Outstanding Charges, Pending Court Dates, Parole/Probation? No data recorded Contacted To Inform of Risk of Harm To Self or Others: No data recorded  Location of Assessment: [facility] ED   Does Patient Present under Involuntary Commitment? No data recorded IVC Papers Initial File Date: No data recorded  [place] of Residence: No data recorded  Patient Currently Receiving the Following Services: No data recorded  Determination of Need: No data recorded  Options For Referral: No data recorded    CCA Biopsychosocial Intake/Chief Complaint:  No data recorded Current Symptoms/Problems: No data recorded  Patient Reported Schizophrenia/Schizoaffective Diagnosis in Past: No data recorded  Strengths: No data recorded Preferences: No data recorded Abilities: No data recorded  Type of Services Patient Feels are Needed: No data recorded  Initial Clinical Notes/Concerns: No data recorded  Mental Health Symptoms Depression:  No data recorded  Duration of Depressive symptoms: No data recorded  Mania:  No data recorded  Anxiety:   No data recorded  Psychosis:  No data recorded  Duration of Psychotic symptoms: No data recorded  Trauma:  No data recorded  Obsessions:  No data recorded  Compulsions:  No data recorded  Inattention:  No data recorded  Hyperactivity/Impulsivity:  No data recorded  Oppositional/Defiant Behaviors:  No data recorded  Emotional Irregularity:  No data recorded  Other Mood/Personality Symptoms:  No data recorded   Mental Status Exam Appearance and self-care  Stature:  No data recorded  Weight:  No data recorded   Clothing:  No data recorded  Grooming:  No data recorded  Cosmetic use:  No data recorded  Posture/gait:  No data recorded  Motor activity:  No data recorded  Sensorium  Attention:  No data recorded  Concentration:  No data recorded  Orientation:  No data recorded  Recall/memory:  No data recorded  Affect and Mood  Affect:  No data recorded  Mood:  No data recorded  Relating  Eye contact:  No data recorded  Facial expression:  No data recorded  Attitude toward examiner:  No data recorded  Thought and Language  Speech flow: No data recorded  Thought content:  No data recorded  Preoccupation:  No data recorded  Hallucinations:  No data recorded  Organization:  No data recorded  [employer] of Knowledge:  No data recorded  Intelligence:  No data recorded  Abstraction:  No data recorded  Judgement:  No data recorded  Reality Testing:  No data recorded  Insight:  No data recorded  Decision Making:  No data recorded  Social Functioning  Social Maturity:  No data recorded  Social Judgement:  No data recorded  Stress  Stressors:  No data recorded  Coping Ability:  No data recorded  Skill Deficits:  No data recorded  Supports:  No data recorded    Religion:    Leisure/Recreation:    Exercise/Diet:     CCA Employment/Education Employment/Work Situation:    Education:     CCA Family/Childhood History Family and Relationship History:    Childhood History:     Child/Adolescent Assessment:     CCA Substance Use Alcohol/Drug Use:                           ASAM's:  Six Dimensions of Multidimensional Assessment  Dimension 1:  Acute Intoxication and/or Withdrawal Potential:      Dimension 2:  Biomedical Conditions and Complications:      Dimension 3:  Emotional, Behavioral, or Cognitive Conditions and Complications:     Dimension 4:  Readiness to Change:     Dimension 5:  Relapse, Continued use, or Continued Problem  Potential:     Dimension 6:  Recovery/Living Environment:     ASAM Severity Score:    ASAM Recommended Level of Treatment:     Substance use Disorder (SUD)    Recommendations for Services/Supports/Treatments:    DSM5 Diagnoses: Patient Active Problem List   Diagnosis Date Noted   Vasogenic brain edema (HCC) 04/28/2022   Acute metabolic encephalopathy 04/27/2022   Acute urinary retention 04/27/2022   Brain tumor (HCC) 02/27/2022   Meningioma (HCC) 02/27/2022   Spastic hemiparesis of left nondominant side (HCC) 01/06/2022   History of cerebral aneurysm 01/06/2022   Gait abnormality 01/06/2022   Mixed diabetic hyperlipidemia associated with type 2 diabetes mellitus (HCC) 11/18/2019   Diabetes mellitus (HCC) 11/18/2019   Counseling on health promotion and disease prevention 11/18/2019   History of smoking for 6-10 years 10/29/2019   Pain in right knee 08/25/2019   Vitamin D insufficiency 08/11/2019   Hypertriglyceridemia 08/11/2019   Prediabetes 08/11/2019   Serum sodium elevated 08/11/2019   dysthymia 10/28/2018   (BMI 30.0-34.9) 10/28/2018   Elevated blood pressure reading- no dx HTN 10/28/2018   Family history of diabetes mellitus (DM) 10/28/2018   @[email or web address] @  [place], Counselor

## 2023-09-15 NOTE — ED Notes (Signed)
Pt states he threw his glasses. After further inspection of the rm, Charge Nurse Megan found glasses under recliner. Glasses laying at bedside.

## 2023-09-15 NOTE — BH Assessment (Signed)
behavioral disturbance     Psych team unable to gather information from Patrick Buchanan.   Psych team called [facility] [phone] to inform the facility that the patient did not meet criteria for inpatient psychiatric care.. At the this time nurse stated that she was a Education administrator" and was unsure about circumstances involving patient. At this time nurse says that she was unable to make a decision about the patient returning to SNF.

## 2023-09-15 NOTE — ED Provider Notes (Addendum)
[facility] Provider Note    Event Date/Time   First MD Initiated Contact with Patient 09/15/23 [phone]     (approximate)   History   behavioral disturbance    HPI  Level V caveat: Limited by dementia  Patrick Buchanan is a 72 y.o. male brought to the ED via EMS from [facility] for behavioral medicine evaluation.  Patient with a history of brain tumor, adjustment disorder, depression who has been at [facility] for only 8 hours.  Facility staff requesting a behavioral medicine evaluation for patient yelling out.  Patient denies chest pain, shortness of breath, abdominal pain, nausea, vomiting.     Past Medical History   Past Medical History:  Diagnosis Date  . Allergy    hay fever  . Blood transfusion without reported diagnosis   . Cerebral aneurysm 1992   "leaking"; treated by Dr Newell Coral  . Depression   . Diabetes mellitus without complication (HCC)    type 2  . GERD (gastroesophageal reflux disease)    past hx      Active Problem List   Patient Active Problem List   Diagnosis Date Noted  . Vasogenic brain edema (HCC) 04/28/2022  . Acute metabolic encephalopathy 04/27/2022  . Acute urinary retention 04/27/2022  . Brain tumor (HCC) 02/27/2022  . Meningioma (HCC) 02/27/2022  . Spastic hemiparesis of left nondominant side (HCC) 01/06/2022  . History of cerebral aneurysm 01/06/2022  . Gait abnormality 01/06/2022  . Mixed diabetic hyperlipidemia associated with type 2 diabetes mellitus (HCC) 11/18/2019  . Diabetes mellitus (HCC) 11/18/2019  . Counseling on health promotion and disease prevention 11/18/2019  . History of smoking for 6-10 years 10/29/2019  . Pain in right knee 08/25/2019  . Vitamin D insufficiency 08/11/2019  . Hypertriglyceridemia 08/11/2019  . Prediabetes 08/11/2019  . Serum sodium elevated 08/11/2019  . dysthymia 10/28/2018  . (BMI 30.0-34.9) 10/28/2018  . Elevated blood pressure reading- no dx HTN 10/28/2018  .  Family history of diabetes mellitus (DM) 10/28/2018     Past Surgical History   Past Surgical History:  Procedure Laterality Date  . APPENDECTOMY    . CEREBRAL ANEURYSM REPAIR  1992   clipping by Dr Newell Coral; cannot have an MRI  . CRANIOTOMY Bilateral 02/27/2022   Procedure: Craniotomy - bilateral - Parietal;  Surgeon: Julio Sicks, MD;  Location: [facility] OR;  Service: Neurosurgery;  Laterality: Bilateral;  . HERNIA REPAIR N/A 1990   inguinal  . INGUINAL HERNIA REPAIR     as a child and then another at 55 yrs old  . TONSILLECTOMY    . TUMOR EXCISION  2005   benign cervical     Home Medications   Prior to Admission medications   Medication Sig Start Date End Date Taking? Authorizing Provider  acetaminophen (TYLENOL) 500 MG tablet Take 1,000 mg by mouth 2 (two) times daily.    [provider]  ALPRAZolam Prudy Feeler) 0.5 MG tablet Take 1 tablet (0.5 mg total) by mouth 2 (two) times daily as needed for anxiety. 05/13/22   Leroy Sea, MD  ARIPiprazole (ABILIFY) 10 MG tablet Take 10 mg by mouth daily. 06/09/22   [provider]  baclofen (LIORESAL) 10 MG tablet Take 1 tablet (10 mg total) by mouth 3 (three) times daily. 04/24/22   Carlean Jews, NP  bisacodyl (DULCOLAX) 10 MG suppository Place 1 suppository (10 mg total) rectally daily as needed for moderate constipation. 05/13/22   Leroy Sea, MD  cephALEXin [facility])  500 MG capsule Take 1 capsule (500 mg total) by mouth 3 (three) times daily. 09/02/22   Garlon Hatchet, PA-C  Cholecalciferol (VITAMIN D3 PO) Take 1 capsule by mouth daily.    [provider]  diclofenac Sodium (VOLTAREN) 1 % GEL Apply 2 g topically 3 (three) times daily. 03/30/22   Setzer, Lynnell Jude, PA-C  escitalopram (LEXAPRO) 20 MG tablet Take 1 tablet (20 mg total) by mouth daily. 04/24/22   Carlean Jews, NP  gabapentin (NEURONTIN) 100 MG capsule Take 1 capsule (100 mg total) by mouth 2 (two) times daily. 04/24/22   Carlean Jews, NP   gabapentin (NEURONTIN) 300 MG capsule Take 300 mg by mouth daily. 05/16/22   [provider]  gabapentin (NEURONTIN) 300 MG capsule Take 1 capsule (300 mg total) by mouth 3 (three) times daily for 5 days. 09/22/22 09/27/22  Terald Sleeper, MD  HYDROcodone-acetaminophen (NORCO/VICODIN) 5-325 MG tablet Take by mouth. 05/17/22   [provider]  ketoconazole (NIZORAL) 2 % cream Apply 1 Application topically daily. 06/14/22   [provider]  ketoconazole (NIZORAL) 2 % shampoo Apply 1 Application topically. Every 20 days 06/14/22   [provider]  Lacosamide 100 MG TABS Take 1 tablet (100 mg total) by mouth 2 (two) times daily. 01/02/23   Levert Feinstein, MD  lamoTRIgine (LAMICTAL) 100 MG tablet Take 1 tablet (100 mg total) by mouth 2 (two) times daily. 01/02/23   Levert Feinstein, MD  linaclotide [facility]) 145 MCG CAPS capsule Take 1 capsule (145 mcg total) by mouth daily before breakfast. 04/24/22   Carlean Jews, NP  magnesium gluconate (MAGONATE) 500 MG tablet Take 0.5 tablets (250 mg total) by mouth at bedtime. Patient taking differently: Take 250 mg by mouth every other day. 04/24/22   Carlean Jews, NP  magnesium hydroxide (MILK OF MAGNESIA) 400 MG/5ML suspension Take 30 mLs by mouth daily as needed for mild constipation.    [provider]  Multiple Vitamin (MULTIVITAMIN) tablet Take 1 tablet by mouth daily.    [provider]  pantoprazole (PROTONIX) 40 MG tablet Take 1 tablet (40 mg total) by mouth at bedtime. 04/24/22   Carlean Jews, NP  polyvinyl alcohol (LIQUIFILM TEARS) 1.4 % ophthalmic solution Place 1 drop into both eyes as needed for dry eyes.    [provider]  QUEtiapine (SEROQUEL) 50 MG tablet Take 1 tablet (50 mg total) by mouth at bedtime. 05/13/22   Leroy Sea, MD  rosuvastatin (CRESTOR) 20 MG tablet Take 1 tablet (20 mg total) by mouth daily. 04/24/22   Carlean Jews, NP  senna-docusate (SENOKOT-S) 8.6-50 MG tablet  Take 2 tablets by mouth at bedtime. 03/30/22   Setzer, Lynnell Jude, PA-C  tamsulosin (FLOMAX) 0.4 MG CAPS capsule Take 1 capsule (0.4 mg total) by mouth daily after supper. 05/13/22   Leroy Sea, MD  traZODone (DESYREL) 50 MG tablet Take 0.5-1 tablets (25-50 mg total) by mouth at bedtime as needed for sleep. 04/24/22   Carlean Jews, NP  Vitamin D, Ergocalciferol, (DRISDOL) 1.25 MG (50000 UNIT) CAPS capsule Take 1 capsule (50,000 Units total) by mouth every Sunday. Take one tablet wkly 04/30/22   Carlean Jews, NP     Allergies  Other   Family History   Family History  Problem Relation Age of Onset  . Breast cancer Mother   . Diabetes Brother   . Colon cancer Neg Hx   . Colon polyps Neg Hx   .  Esophageal cancer Neg Hx   . Rectal cancer Neg Hx   . Stomach cancer Neg Hx      Physical Exam  Triage Vital Signs: ED Triage Vitals  Encounter Vitals Group     BP 09/15/23 0216 101/66     Systolic BP Percentile --      Diastolic BP Percentile --      Pulse Rate 09/15/23 0216 88     Resp 09/15/23 0216 18     Temp 09/15/23 0216 98.7 F (37.1 C)     Temp Source 09/15/23 0216 Oral     SpO2 09/15/23 0216 93 %     Weight --      Height 09/15/23 0216 6' (1.829 m)     Head Circumference --      Peak Flow --      Pain Score 09/15/23 0214 0     Pain Loc --      Pain Education --      Exclude from Growth Chart --     Updated Vital Signs: BP 113/84 (BP Location: Right Arm)   Pulse 93   Temp 97.9 F (36.6 C) (Oral)   Resp 18   Ht 6' (1.829 m)   SpO2 94%   BMI 20.34 kg/m    General: Awake, no distress.  CV:  RRR.  Good peripheral perfusion.  Resp:  Normal effort.  CTAB. Abd:  Nontender.  No distention.  Other:  Alert and oriented to self.  Follows simple commands.  Sign left hemiparesis, lower extremity> upper extremity.  Occasional nonviolent outbursts which are able to be verbally redirected.   ED Results / Procedures / Treatments  Labs (all labs ordered are  listed, but only abnormal results are displayed) Labs Reviewed  COMPREHENSIVE METABOLIC PANEL - Abnormal; Notable for the following components:      Result Value   Potassium 3.3 (*)    Creatinine, Ser 0.58 (*)    Calcium 8.6 (*)    Total Protein 5.5 (*)    Albumin 3.0 (*)    All other components within normal limits  ACETAMINOPHEN LEVEL - Abnormal; Notable for the following components:   Acetaminophen (Tylenol), Serum <10 (*)    All other components within normal limits  SALICYLATE LEVEL - Abnormal; Notable for the following components:   Salicylate Lvl <7.0 (*)    All other components within normal limits  URINALYSIS, ROUTINE W REFLEX MICROSCOPIC - Abnormal; Notable for the following components:   Color, Urine YELLOW (*)    APPearance CLOUDY (*)    Ketones, ur 20 (*)    All other components within normal limits  CBC  ETHANOL  TROPONIN I (HIGH SENSITIVITY)     EKG  Patient refused   RADIOLOGY I have independently visualized and interpreted patient's imaging study as well as noted the radiology interpretation:  CT head: Chronic changes, no significant change in subsequent intracranial mass effect, no ventriculomegaly, no ICH  Official radiology report(s): CT Head Wo Contrast  Result Date: 09/15/2023 CLINICAL DATA:  72 year old male with altered mental status. EXAM: CT HEAD WITHOUT CONTRAST TECHNIQUE: Contiguous axial images were obtained from the base of the skull through the vertex without intravenous contrast. RADIATION DOSE REDUCTION: This exam was performed according to the departmental dose-optimization program which includes automated exposure control, adjustment of the mA and/or kV according to patient size and/or use of iterative reconstruction technique. COMPARISON:  Head CT 05/02/2023 and earlier. FINDINGS: Brain: Numerous chronic partially calcified extra-axial masses in keeping  with chronic Meningiomatosis. Since August of last year vasogenic edema in the left  hemisphere has progressed (series 2, image 19) appears related to large chronic lobulated roughly 3.6 cm meningioma at the junction of the left temporal and occipital lobes. Hypodensity tracks more cephalad and now into the deep white matter capsules there. Chronic frontal lobe edema adjacent to the left frontal horn appears stable since last year, appears related to adjacent 3.8 cm meningioma there contiguous with the left sphenoid wing. Numerous smaller meningeal masses bilaterally. Chronic encephalomalacia right superior perirolandic area from chronic resection. Intracranial mass effect with rightward midline shift of 4-5 mm and partial effacement of the left lateral ventricle stable from last year. No ventriculomegaly. Basilar cisterns remain patent. No superimposed acute intracranial hemorrhage or cortically based infarct identified. Vascular: Chronic right ICA terminus region aneurysm clip. No suspicious intracranial vascular hyperdensity. Skull: Chronic right frontotemporal and right posterior vertex craniotomy, cranioplasty. No acute osseous abnormality identified. Sinuses/Orbits: Visualized paranasal sinuses and mastoids are stable and well aerated. Other: No acute orbit or scalp soft tissue finding. IMPRESSION: 1. Chronic Meningiomatosis and postoperative changes. 2. Progressed chronic vasogenic edema in the left hemisphere since August of last year, but subsequent intracranial mass effect has not significantly changed - with chronic rightward midline shift of 4-5 mm. 3. Stable mass effect on the ventricles. No ventriculomegaly. No acute intracranial hemorrhage or cortically based infarct. 4.  Chronic distal right ICA aneurysm clipping. Electronically Signed   By: Odessa Fleming M.D.   On: 09/15/2023 05:18     PROCEDURES:  Critical Care performed: No  .1-3 Lead EKG Interpretation  Performed by: Irean Hong, MD Authorized by: Irean Hong, MD     Interpretation: normal     ECG rate:  85   ECG rate  assessment: normal     Rhythm: sinus rhythm     Ectopy: none     Conduction: normal   Comments:     Patient placed on cardiac monitor to evaluate for arrhythmias    MEDICATIONS ORDERED IN ED: Medications  sodium chloride 0.9 % bolus 500 mL (has no administration in time range)  Lacosamide TABS 100 mg (has no administration in time range)  lamoTRIgine (LAMICTAL) tablet 100 mg (has no administration in time range)  potassium chloride SA (KLOR-CON M) CR tablet 40 mEq (40 mEq Oral Given 09/15/23 0622)     IMPRESSION / MDM / ASSESSMENT AND PLAN / ED COURSE  I reviewed the triage vital signs and the nursing notes.                             72 year old male sent for behavioral medicine evaluation. Differential diagnosis includes, but is not limited to, alcohol, illicit or prescription medications, or other toxic ingestion; intracranial pathology such as stroke or intracerebral hemorrhage; fever or infectious causes including sepsis; hypoxemia and/or hypercarbia; uremia; trauma; endocrine related disorders such as diabetes, hypoglycemia, and thyroid-related diseases; hypertensive encephalopathy; etc. I personally reviewed patient's records and note a neurology office visit from 09/05/2023 for follow-up parasagittal large-size meningioma resection, still with residual multiple meningioma, history of right brain aneurysm status post right craniotomy, history of benign cervical subdural tumor resection in 2005.  Patient's presentation is most consistent with exacerbation of chronic illness.  The patient is on the cardiac monitor to evaluate for evidence of arrhythmia and/or significant heart rate changes.  Will obtain basic lab work, CT head.  TeleSitter ordered and patient  with bed alarm placed close to nursing station.  Clinical Course as of 09/15/23 0623  Sat Sep 15, 2023  7628 Patient refused EKG [JS]  0550 CT head stable from prior; laboratory results unremarkable other than mild  hypokalemia which we will replete orally.  At this time will consult behavioral medicine to evaluate patient in the emergency department. [JS]  [id number] UA demonstrates ketonuria, will administer IV fluids.  No pharmacy tech available but I am able to see neurology note dated 09/05/2023 which indicates patient is on Vimpat and Lamictal.  Have ordered these medicines.  Daytime pharmacy tech will reconcile patient's other medications. [JS]    Clinical Course User Index [JS] Irean Hong, MD     FINAL CLINICAL IMPRESSION(S) / ED DIAGNOSES   Final diagnoses:  Adjustment disorder with disturbance of conduct     Rx / DC Orders   ED Discharge Orders     None        Note:  This document was prepared using Dragon voice recognition software and may include unintentional dictation errors.   Irean Hong, MD 09/15/23 3151    Irean Hong, MD 09/15/23 7616    Irean Hong, MD 09/15/23 [phone]

## 2023-09-15 NOTE — ED Notes (Signed)
Pt c/o not having his glasses. This RN looked around in rm for glasses but has been unable to find them.

## 2023-09-15 NOTE — ED Notes (Signed)
Pt states he feels like he is chocking when taking meds and eating food. Pt able to talk in NAD and airway is clear upon assessment.

## 2023-09-15 NOTE — ED Notes (Signed)
Attempted to give pt Geodon for a third time to avoid giving IM medications per MD. Pt spit it out several times, but eventually took geodon PO.

## 2023-09-15 NOTE — BH Assessment (Signed)
TTS unable to assess at this time. The patient was unable to participate with the assessment.

## 2023-09-15 NOTE — ED Notes (Signed)
Pt asking for water at this time. Pt given water.

## 2023-09-15 NOTE — ED Triage Notes (Signed)
Pt to ed from [facility] via [facility] for a psych eval at request of the facility. Pt has been at the facility for 8 hours. This is his third facility. Pt has HX of dementia, anxiety and several mood disorders. Pt is alert and can follow commands but is at baseline. Facility called PD bc they advised he was combative.

## 2023-09-15 NOTE — ED Triage Notes (Addendum)
First Nurse Note: BIB [facility] from [facility]. Pt was discharged from [facility] ED today. Facility staff requesting a psych evaluation. Facility staff to EMS reports that pt "yells out loud." Reportedly pt has only resided at this facility for the last 8 hours. Per report to EMS by facility, pt has hx of dementia, and brain tumor. Staff denies known acute change in patient behavior to EMS.

## 2023-09-15 NOTE — ED Notes (Signed)
Pt offered meal tray. Pt took one bite of food and refused to eat anymore.

## 2023-09-15 NOTE — ED Provider Notes (Signed)
-----------------------------------------   3:26 PM on 09/15/2023 -----------------------------------------  Patient remains in the emergency department.  I have reviewed his MAR from [facility] and have reordered the patient's current medications.  Awaiting TOC for placement assistance.   Minna Antis, MD 09/15/23 1526

## 2023-09-15 NOTE — ED Notes (Signed)
While attempting to give pt medications in apple sauce, he threw medications onto ground. Meds were wasted and re-retrieved. Attempted to give pt medications again at 0912 and he again picked out the geodon and threw it on the ground. MD notified. Other medications given with apple sauce successfully.

## 2023-09-16 MED ORDER — HALOPERIDOL LACTATE 5 MG/ML IJ SOLN
5.0000 mg | Freq: Once | INTRAMUSCULAR | Status: AC
  Administered 2023-09-16: 5 mg via INTRAMUSCULAR
  Filled 2023-09-16: qty 1

## 2023-09-16 NOTE — ED Notes (Signed)
Patient c/o left arm pain. Pt given vicodin crushed in applesauce. Pt able to swallow without issue. Pt asked to be repositioned and it was found his brief was soiled.  This nurse with the help of Ladona Ridgel, RN, cleaned patient and provided a new brief.  Pt repositioned in bed.

## 2023-09-16 NOTE — ED Notes (Signed)
This RN called [facility] [phone], spoke with Dorathy Daft from [facility], per [place] she is an Psychiatric nurse and is unable to make determination as to whether patient will be accepted back.   Director of Nursing for building where patient Gardiner Ramus: [phone]  Primary RN Herbert Seta made aware, will follow up with [place] in the morning.

## 2023-09-16 NOTE — ED Notes (Signed)
This RN spoke with Ms. Diedre Chilton Si, DON at [facility]. She states patient can return to facility.

## 2023-09-16 NOTE — ED Notes (Signed)
Attempted to contact [facility] to give report. No Answer for a third time

## 2023-09-16 NOTE — ED Notes (Signed)
Attempted to contact [facility] to give report. No Answer for a second time

## 2023-09-16 NOTE — ED Provider Notes (Signed)
-----------------------------------------   5:17 AM on 09/16/2023 -----------------------------------------   Blood pressure 95/70, pulse 83, temperature 98.2 F (36.8 C), temperature source Axillary, resp. rate 18, height 6' (1.829 m), SpO2 100%.  The patient is calm and cooperative at this time.  There have been no acute events since the last update.  Awaiting disposition plan from case management/social work.    Nelton Amsden, Layla Maw, DO 09/16/23 [phone]

## 2023-09-16 NOTE — ED Provider Notes (Signed)
Nursing facility called and states that they are happy to take the patient back to their facility.   Willy Eddy, MD 09/16/23 [phone]

## 2023-09-16 NOTE — ED Notes (Signed)
Attempted to contact [facility] to give report. No Answer

## 2023-09-18 ENCOUNTER — Telehealth: Payer: Self-pay

## 2023-09-18 NOTE — Telephone Encounter (Signed)
Physician order sheet faxed to [facility]

## 2023-09-28 ENCOUNTER — Other Ambulatory Visit: Payer: Self-pay

## 2023-09-28 ENCOUNTER — Emergency Department
Admission: EM | Admit: 2023-09-28 | Discharge: 2023-09-29 | Disposition: A | Discharge status: Skilled Nursing Facility | Payer: Medicare HMO | Attending: Emergency Medicine | Admitting: Emergency Medicine

## 2023-09-28 ENCOUNTER — Emergency Department: Payer: Medicare HMO

## 2023-09-28 DIAGNOSIS — S60512A Abrasion of left hand, initial encounter: Secondary | ICD-10-CM | POA: Insufficient documentation

## 2023-09-28 DIAGNOSIS — E119 Type 2 diabetes mellitus without complications: Secondary | ICD-10-CM | POA: Diagnosis not present

## 2023-09-28 DIAGNOSIS — W06XXXA Fall from bed, initial encounter: Secondary | ICD-10-CM | POA: Insufficient documentation

## 2023-09-28 DIAGNOSIS — Z85841 Personal history of malignant neoplasm of brain: Secondary | ICD-10-CM | POA: Diagnosis not present

## 2023-09-28 DIAGNOSIS — W19XXXA Unspecified fall, initial encounter: Secondary | ICD-10-CM

## 2023-09-28 DIAGNOSIS — S60511A Abrasion of right hand, initial encounter: Secondary | ICD-10-CM | POA: Diagnosis not present

## 2023-09-28 DIAGNOSIS — Y92122 Bedroom in nursing home as the place of occurrence of the external cause: Secondary | ICD-10-CM | POA: Insufficient documentation

## 2023-09-28 DIAGNOSIS — S6992XA Unspecified injury of left wrist, hand and finger(s), initial encounter: Secondary | ICD-10-CM | POA: Diagnosis present

## 2023-09-28 LAB — COMPREHENSIVE METABOLIC PANEL
ALT: 20 U/L (ref 0–44)
AST: 22 U/L (ref 15–41)
Albumin: 3.2 g/dL — ABNORMAL LOW (ref 3.5–5.0)
Alkaline Phosphatase: 83 U/L (ref 38–126)
Anion gap: 6 (ref 5–15)
BUN: 12 mg/dL (ref 8–23)
CO2: 28 mmol/L (ref 22–32)
Calcium: 8.5 mg/dL — ABNORMAL LOW (ref 8.9–10.3)
Chloride: 104 mmol/L (ref 98–111)
Creatinine, Ser: 0.54 mg/dL — ABNORMAL LOW (ref 0.61–1.24)
GFR, Estimated: >60 mL/min (ref >60)
Glucose, Bld: 92 mg/dL (ref 70–99)
Potassium: 3.7 mmol/L (ref 3.5–5.1)
Sodium: 138 mmol/L (ref 135–145)
Total Bilirubin: 0.7 mg/dL (ref <1.2)
Total Protein: 6 g/dL — ABNORMAL LOW (ref 6.5–8.1)

## 2023-09-28 LAB — CBC WITH DIFFERENTIAL/PLATELET
Abs Immature Granulocytes: 0.02 10*3/uL (ref 0.00–0.07)
Basophils Absolute: 0 10*3/uL (ref 0.0–0.1)
Basophils Relative: 0 %
Eosinophils Absolute: 0 10*3/uL (ref 0.0–0.5)
Eosinophils Relative: 1 %
HCT: 42.5 % (ref 39.0–52.0)
Hemoglobin: 13.8 g/dL (ref 13.0–17.0)
Immature Granulocytes: 0 %
Lymphocytes Relative: 17 %
Lymphs Abs: 1.1 10*3/uL (ref 0.7–4.0)
MCH: 31.4 pg (ref 26.0–34.0)
MCHC: 32.5 g/dL (ref 30.0–36.0)
MCV: 96.8 fL (ref 80.0–100.0)
Monocytes Absolute: 0.6 10*3/uL (ref 0.1–1.0)
Monocytes Relative: 9 %
Neutro Abs: 4.6 10*3/uL (ref 1.7–7.7)
Neutrophils Relative %: 73 %
Platelets: 256 10*3/uL (ref 150–400)
RBC: 4.39 MIL/uL (ref 4.22–5.81)
RDW: 13.9 % (ref 11.5–15.5)
WBC: 6.3 10*3/uL (ref 4.0–10.5)
nRBC: 0 % (ref 0.0–0.2)

## 2023-09-28 LAB — CK: Total CK: 31 U/L — ABNORMAL LOW (ref 49–397)

## 2023-09-28 LAB — TROPONIN I (HIGH SENSITIVITY): Troponin I (High Sensitivity): 9 ng/L (ref <18)

## 2023-09-28 NOTE — Discharge Instructions (Addendum)
Your blood work today was normal.  Your CT scan of your head and neck did not show any acute abnormalities.  There have not been any changes to the meningiomas.  Your chest x-ray, left wrist x-ray and left shoulder x-ray were all normal.  There were no fractures.  Your EKG was normal.  You can take Tylenol as needed for pain.  Follow-up with your primary care provider as needed or you can return to the ED with any new or worsening symptoms.

## 2023-09-28 NOTE — ED Notes (Signed)
Lab was called to add on CK level.

## 2023-09-28 NOTE — ED Notes (Signed)
Pt was placed in bed with total assistance needed by 3 RN and 1 PA. Pt was adjusted in a comfortable area.

## 2023-09-28 NOTE — ED Provider Notes (Signed)
[facility] Provider Note    Event Date/Time   First MD Initiated Contact with Patient 09/28/23 2047     (approximate)   History   Fall   HPI  Patrick Buchanan is a 72 y.o. male with PMH of depression, diabetes, brain tumor, vasogenic brain edema, meningioma, spastic hemiparesis of left side and dysthymia presents for evaluation after a fall.  Patient is cared for at an SNF.  Patient reports he was sitting on the edge of his bed and was reaching to get something off of the table that was too far away, causing him to fall forward from the bed landing on his left arm and head.  He reports he laid on the floor for approximately an hour and a half before someone came to his room and found him.  He reports pain to the left shoulder, left wrist and chest.  He denies LOC, head and neck pain at this time.      Physical Exam   Triage Vital Signs: ED Triage Vitals  Encounter Vitals Group     BP 09/28/23 1953 98/63     Systolic BP Percentile --      Diastolic BP Percentile --      Pulse Rate 09/28/23 1953 85     Resp 09/28/23 1953 17     Temp 09/28/23 1953 98.5 F (36.9 C)     Temp Source 09/28/23 1953 Oral     SpO2 09/28/23 1929 97 %     Weight --      Height --      Head Circumference --      Peak Flow --      Pain Score 09/28/23 1953 5     Pain Loc --      Pain Education --      Exclude from Growth Chart --     Most recent vital signs: Vitals:   09/28/23 1929 09/28/23 1953  BP:  98/63  Pulse:  85  Resp:  17  Temp:  98.5 F (36.9 C)  SpO2: 97% 97%   General: Awake, no distress.  CV:  Good peripheral perfusion. RRR. Resp:  Normal effort. CTAB. Abd:  No distention.  Other:  Small abrasions on the bilateral hands, no contusions to the forehead or scalp.  Neuro exam limited by patient's history but no focal neurodeficits noted.   ED Results / Procedures / Treatments   Labs (all labs ordered are listed, but only abnormal results are  displayed) Labs Reviewed  COMPREHENSIVE METABOLIC PANEL - Abnormal; Notable for the following components:      Result Value   Creatinine, Ser 0.54 (*)    Calcium 8.5 (*)    Total Protein 6.0 (*)    Albumin 3.2 (*)    All other components within normal limits  CK - Abnormal; Notable for the following components:   Total CK 31 (*)    All other components within normal limits  CBC WITH DIFFERENTIAL/PLATELET  TROPONIN I (HIGH SENSITIVITY)  TROPONIN I (HIGH SENSITIVITY)     EKG  EKG shows normal sinus rhythm.  Vent. rate 75 BPM PR interval 150 ms QRS duration 100 ms QT/QTcB 386/431 ms P-R-T axes 45 -56 76   RADIOLOGY  CT head, CT cervical spine, x-rays of the left shoulder, left wrist and chest obtained, I interpreted the images as well as reviewed the radiologist report.  Left wrist and chest x-ray negative for any acute abnormalities.  Left shoulder  x-ray shows degenerative changes.  CT head does not show any acute intracranial abnormalities, there are multiple unchanged intracranial meningiomas with an unchanged pattern of vasogenic edema and chronic 5 mm rightward midline shift.  No acute fractures or traumatic alignment of the cervical spine.   PROCEDURES:  Critical Care performed: No  Procedures   MEDICATIONS ORDERED IN ED: Medications - No data to display   IMPRESSION / MDM / ASSESSMENT AND PLAN / ED COURSE  I reviewed the triage vital signs and the nursing notes.                             72 year old male presents for evaluation after an unwitnessed fall today.  Vital signs are stable, patient uncomfortable on exam asking to be frequently repositioned.  Differential diagnosis includes, but is not limited to, fracture, dislocation, muscle strain, contusion, closed head injury.  Patient's presentation is most consistent with acute complicated illness / injury requiring diagnostic workup.  CMP and CBC unremarkable.  Troponin is negative.  CK is not  elevated.  EKG shows normal sinus rhythm.  X-rays of the chest, left wrist and left shoulder are negative for any acute abnormalities.  CT of the head and neck also negative for any acute abnormalities.  Given the negative imaging and reassuring findings on physical exam, I feel that patient is stable to return to his nursing care facility.  Patient can have Tylenol as needed for pain.  He voiced understanding, all questions were answered and he was stable at discharge.    FINAL CLINICAL IMPRESSION(S) / ED DIAGNOSES   Final diagnoses:  Fall, initial encounter     Rx / DC Orders   ED Discharge Orders     None        Note:  This document was prepared using Dragon voice recognition software and may include unintentional dictation errors.   Cameron Ali, PA-C 09/28/23 [name]    Phineas Semen, MD 09/29/23 [phone]

## 2023-09-28 NOTE — ED Triage Notes (Addendum)
Pt via ems from SNF after fall from edge of bed- c/o left shoulder, left wrist, and left side pain. Pt is currently AOX4, NAD noted. Respirations even and unlabored, no obvious deformities or bleeding noted. Pt did hit his head, denies LOC or head pain at this time.

## 2023-09-28 NOTE — ED Notes (Signed)
Pt to ED via EMS from [facility], pt had ground level fall, landing on L side. Pt reports being on the ground for aprox 1 hour.

## 2023-09-29 NOTE — ED Notes (Signed)
Called [facility] spoke with rep. Richard he put the pt. on the list and a truck will be on the way.

## 2023-10-06 ENCOUNTER — Emergency Department: Payer: Medicare HMO

## 2023-10-06 ENCOUNTER — Other Ambulatory Visit: Payer: Self-pay

## 2023-10-06 ENCOUNTER — Emergency Department
Admission: EM | Admit: 2023-10-06 | Discharge: 2023-10-07 | Disposition: A | Discharge status: Home / Self Care | Payer: Medicare HMO | Attending: Emergency Medicine | Admitting: Emergency Medicine

## 2023-10-06 DIAGNOSIS — W06XXXA Fall from bed, initial encounter: Secondary | ICD-10-CM | POA: Insufficient documentation

## 2023-10-06 DIAGNOSIS — M25552 Pain in left hip: Secondary | ICD-10-CM | POA: Diagnosis not present

## 2023-10-06 DIAGNOSIS — I10 Essential (primary) hypertension: Secondary | ICD-10-CM | POA: Diagnosis not present

## 2023-10-06 DIAGNOSIS — S6992XA Unspecified injury of left wrist, hand and finger(s), initial encounter: Secondary | ICD-10-CM | POA: Diagnosis present

## 2023-10-06 DIAGNOSIS — E119 Type 2 diabetes mellitus without complications: Secondary | ICD-10-CM | POA: Insufficient documentation

## 2023-10-06 DIAGNOSIS — S0990XA Unspecified injury of head, initial encounter: Secondary | ICD-10-CM | POA: Insufficient documentation

## 2023-10-06 DIAGNOSIS — W19XXXA Unspecified fall, initial encounter: Secondary | ICD-10-CM

## 2023-10-06 LAB — CBC WITH DIFFERENTIAL/PLATELET
Abs Immature Granulocytes: 0.01 10*3/uL (ref 0.00–0.07)
Basophils Absolute: 0 10*3/uL (ref 0.0–0.1)
Basophils Relative: 0 %
Eosinophils Absolute: 0.1 10*3/uL (ref 0.0–0.5)
Eosinophils Relative: 2 %
HCT: 40 % (ref 39.0–52.0)
Hemoglobin: 12.9 g/dL — ABNORMAL LOW (ref 13.0–17.0)
Immature Granulocytes: 0 %
Lymphocytes Relative: 22 %
Lymphs Abs: 1.1 10*3/uL (ref 0.7–4.0)
MCH: 32.2 pg (ref 26.0–34.0)
MCHC: 32.3 g/dL (ref 30.0–36.0)
MCV: 99.8 fL (ref 80.0–100.0)
Monocytes Absolute: 0.5 10*3/uL (ref 0.1–1.0)
Monocytes Relative: 10 %
Neutro Abs: 3.2 10*3/uL (ref 1.7–7.7)
Neutrophils Relative %: 66 %
Platelets: 228 10*3/uL (ref 150–400)
RBC: 4.01 MIL/uL — ABNORMAL LOW (ref 4.22–5.81)
RDW: 13.5 % (ref 11.5–15.5)
WBC: 4.9 10*3/uL (ref 4.0–10.5)
nRBC: 0 % (ref 0.0–0.2)

## 2023-10-06 LAB — COMPREHENSIVE METABOLIC PANEL
ALT: 20 U/L (ref 0–44)
AST: 26 U/L (ref 15–41)
Albumin: 2.9 g/dL — ABNORMAL LOW (ref 3.5–5.0)
Alkaline Phosphatase: 112 U/L (ref 38–126)
Anion gap: 8 (ref 5–15)
BUN: 14 mg/dL (ref 8–23)
CO2: 29 mmol/L (ref 22–32)
Calcium: 8.3 mg/dL — ABNORMAL LOW (ref 8.9–10.3)
Chloride: 103 mmol/L (ref 98–111)
Creatinine, Ser: 0.56 mg/dL — ABNORMAL LOW (ref 0.61–1.24)
GFR, Estimated: >60 mL/min (ref >60)
Glucose, Bld: 100 mg/dL — ABNORMAL HIGH (ref 70–99)
Potassium: 3.8 mmol/L (ref 3.5–5.1)
Sodium: 140 mmol/L (ref 135–145)
Total Bilirubin: 0.5 mg/dL (ref <1.2)
Total Protein: 5.1 g/dL — ABNORMAL LOW (ref 6.5–8.1)

## 2023-10-06 NOTE — ED Triage Notes (Signed)
First Nurse Note:  BIB [facility] from [facility]. Fall 1 hr pta. Bruising to L hip. No shortening or rotation. Pt not c/o pain. DR. Sent for imaging. Pt c/o pain to L foot/toes. Pt has contractions at baseline.   EMS VS:  98/70 74 HR  98% RA

## 2023-10-06 NOTE — ED Notes (Signed)
Pt is alert and oriented to self but not date time or place. Pt has HX of dementia noted in chart. Pt complaint of pain all over. Unknown if pt hit his head when he fell or any LOC as he is confused and cannot advise such.

## 2023-10-07 ENCOUNTER — Other Ambulatory Visit: Payer: Medicare HMO

## 2023-10-07 ENCOUNTER — Emergency Department: Payer: Medicare HMO

## 2023-10-07 NOTE — Discharge Instructions (Addendum)
CT imaging of his head and neck were negative for acute injury.  His left hip showed no sign of fracture.  There was concern for possible fracture at the base of his left pinky finger.  We placed him in a splint at this time but I would recommend repeat x-rays in 1 week to confirm whether this is broken or not and follow-up with orthopedics as needed.  Please keep the splint in place until that time.

## 2023-10-07 NOTE — ED Notes (Signed)
Finger splint applied to left pinky finger and gauze wrap applied over this.  Pt given food (pita, chicken salad, humus and pudding) as well as water since pt was requesting food earlier.  Pt is tired and wants to sleep at this time.  Declines toileting.  Food placed at bedside for pt

## 2023-10-07 NOTE — ED Provider Notes (Signed)
[facility] Provider Note    Event Date/Time   First MD Initiated Contact with Patient 10/06/23 2327     (approximate)   History   Fall   HPI Patrick Buchanan is a 72 y.o. male with history of HTN, dyspnea, DM2, spastic hemiparesis presenting today for fall.  Patient reportedly had a fall at [employer].  Noted to have some bruising to the left hip but was denying any pain complaints.  Was sent to the ED for imaging evaluation.  On arrival, patient states minimal pain to left hip and left hand.  Denies pain symptoms elsewhere.  States he does not ambulate at baseline.  Denies trauma to his head.  Not on blood thinners.  No loss of consciousness.  Reviewed prior chart notes.     Physical Exam   Triage Vital Signs: ED Triage Vitals  Encounter Vitals Group     BP 10/06/23 2155 (!) 90/59     Systolic BP Percentile --      Diastolic BP Percentile --      Pulse Rate 10/06/23 2155 65     Resp 10/06/23 2155 16     Temp 10/06/23 2155 97.6 F (36.4 C)     Temp Source 10/06/23 2155 Oral     SpO2 10/06/23 2155 100 %     Weight --      Height --      Head Circumference --      Peak Flow --      Pain Score 10/06/23 2158 5     Pain Loc --      Pain Education --      Exclude from Growth Chart --     Most recent vital signs: Vitals:   10/06/23 2155 10/06/23 2332  BP: (!) 90/59 106/69  Pulse: 65 64  Resp: 16 16  Temp: 97.6 F (36.4 C) 98 F (36.7 C)  SpO2: 100% 100%   I have reviewed the vital signs. General:  Awake, alert, no acute distress. Head:  Normocephalic, Atraumatic. EENT:  PERRL, EOMI, Oral mucosa pink and moist, Neck is supple. Cardiovascular: Regular rate, 2+ distal pulses. Respiratory:  Normal respiratory effort, symmetrical expansion, no distress.   Extremities: Spastic hemiparesis to left sided extremities.  Able to move right-sided extremities without issue.  Mild tenderness palpation at left hip and over left hand  metacarpals but no obvious deformity.  No tenderness palpation elsewhere throughout the spine, trunk, abdomen, or other extremities. Neuro:  Alert and oriented.  Interacting appropriately.   Skin:  Warm, dry, no rash.   Psych: Appropriate affect.    ED Results / Procedures / Treatments   Labs (all labs ordered are listed, but only abnormal results are displayed) Labs Reviewed  CBC WITH DIFFERENTIAL/PLATELET - Abnormal; Notable for the following components:      Result Value   RBC 4.01 (*)    Hemoglobin 12.9 (*)    All other components within normal limits  COMPREHENSIVE METABOLIC PANEL - Abnormal; Notable for the following components:   Glucose, Bld 100 (*)    Creatinine, Ser 0.56 (*)    Calcium 8.3 (*)    Total Protein 5.1 (*)    Albumin 2.9 (*)    All other components within normal limits     EKG    RADIOLOGY Independently interpreted CT imaging with no acute pathology.  Left hip x-ray with no acute pathology.  Left hand x-ray with possible nondisplaced fracture at the fifth proximal  phalanx.   PROCEDURES:  Critical Care performed: No  Procedures   MEDICATIONS ORDERED IN ED: Medications - No data to display   IMPRESSION / MDM / ASSESSMENT AND PLAN / ED COURSE  I reviewed the triage vital signs and the nursing notes.                              Differential diagnosis includes, but is not limited to, ICH, cervical spine injury, femoral neck fracture, pelvic fracture, left metacarpal fractures  Patient's presentation is most consistent with acute complicated illness / injury requiring diagnostic workup.  Patient is a 72 year old male who is nonambulatory at baseline presenting today for fall out of bed.  Reported trauma to left hip although there is very minimal pain at this site and no pain with rotation or movement of the leg.  Slight pain in the left hand.  Will receive x-rays of those.  Also given that he does not entirely remember the fall will get CT head  and C-spine for further evaluation.  CTs of head and cervical spine showed no acute pathology.  X-ray left hip negative.  X-rays of left hand show either artifact or possible nondisplaced fracture of the fifth finger proximal phalanx.  Will place him in a finger splint at this time and have follow-up x-rays within 1 week.  Strict return precautions were given.  Clinical Course as of 10/07/23 0131  Sun Oct 07, 2023  0108 DG Hand Complete Left Radiologist concern with possible nondisplaced fracture of the proximal phalanx.  Patient has no tenderness palpation at this site and could just be artifact at this time.  Will place in fingertrap splint and a follow-up imaging in 1 week. [DW]    Clinical Course User Index [DW] Janith Lima, MD     FINAL CLINICAL IMPRESSION(S) / ED DIAGNOSES   Final diagnoses:  Fall, initial encounter  Finger injury, left, initial encounter     Rx / DC Orders   ED Discharge Orders          Ordered    AMB referral to orthopedics        10/07/23 0131             Note:  This document was prepared using Dragon voice recognition software and may include unintentional dictation errors.   Janith Lima, MD 10/07/23 [phone]

## 2023-10-20 ENCOUNTER — Other Ambulatory Visit: Payer: Self-pay

## 2023-10-20 ENCOUNTER — Emergency Department: Payer: Medicare HMO

## 2023-10-20 DIAGNOSIS — S40022A Contusion of left upper arm, initial encounter: Secondary | ICD-10-CM | POA: Diagnosis not present

## 2023-10-20 DIAGNOSIS — W1830XA Fall on same level, unspecified, initial encounter: Secondary | ICD-10-CM | POA: Insufficient documentation

## 2023-10-20 DIAGNOSIS — S4992XA Unspecified injury of left shoulder and upper arm, initial encounter: Secondary | ICD-10-CM | POA: Diagnosis present

## 2023-10-20 NOTE — ED Triage Notes (Signed)
Pt to ED via [facility] from [facility] c/o left shoulder pain. Pt reports this morning he fell on his left side while reaching for something. Pt denies hitting his head. Bruising to left shoulder. Pt also complaining of bilateral leg pain and lower back pain, chronic.

## 2023-10-20 NOTE — ED Notes (Signed)
Patient refusing to allow x-ray to take image of his shoulder/arm. Pt continually yelling out for help but when asked what he needs states "take me to the hospital." Attempts made to reorient pt to surroundings and situation are unsuccessful. Pt continues to yell without ability to be redirected by RN or staff. X-rays not obtained at this time due to pt refusal.

## 2023-10-21 ENCOUNTER — Emergency Department
Admission: EM | Admit: 2023-10-21 | Discharge: 2023-10-21 | Disposition: A | Discharge status: Home / Self Care | Payer: Medicare HMO | Attending: Emergency Medicine | Admitting: Emergency Medicine

## 2023-10-21 ENCOUNTER — Emergency Department: Payer: Medicare HMO

## 2023-10-21 DIAGNOSIS — M79602 Pain in left arm: Secondary | ICD-10-CM

## 2023-10-21 DIAGNOSIS — W19XXXA Unspecified fall, initial encounter: Secondary | ICD-10-CM

## 2023-10-21 NOTE — ED Provider Notes (Signed)
[facility] Provider Note    Event Date/Time   First MD Initiated Contact with Patient 10/21/23 0250     (approximate)   History   Arm Injury   HPI  Patrick Buchanan is a 72 y.o. male who presents to the ED for evaluation of Arm Injury   I reviewed multiple ED visits for falls.  History of craniotomies and cerebral aneurysm repairs.  Left-sided spastic paresis  Patient plans to the ED from his local SNF for evaluation of another fall.  Reports left shoulder pain, bruising is noted.     Physical Exam   Triage Vital Signs: ED Triage Vitals [10/20/23 2109]  Encounter Vitals Group     BP 108/67     Systolic BP Percentile      Diastolic BP Percentile      Pulse Rate 100     Resp (!) 22     Temp 99 F (37.2 C)     Temp Source Oral     SpO2 97 %     Weight      Height      Head Circumference      Peak Flow      Pain Score 8     Pain Loc      Pain Education      Exclude from Growth Chart     Most recent vital signs: Vitals:   10/20/23 2109  BP: 108/67  Pulse: 100  Resp: (!) 22  Temp: 99 F (37.2 C)  SpO2: 97%    General: Awake, no distress.  CV:  Good peripheral perfusion.  Resp:  Normal effort.  Abd:  No distention.  MSK:  No deformity noted.  Close bruising noted to the lateral aspect of the left proximal upper arm Neuro:  Left-sided paresis Other:     ED Results / Procedures / Treatments   Labs (all labs ordered are listed, but only abnormal results are displayed) Labs Reviewed - No data to display  EKG   RADIOLOGY Plain film of the left shoulder interpreted by me without evidence of fracture or dislocation  Official radiology report(s): DG Shoulder Left Port Result Date: 10/21/2023 CLINICAL DATA:  Shoulder pain.  Fall on left side. EXAM: LEFT SHOULDER COMPARISON:  Shoulder radiograph 09/28/2023 FINDINGS: No fracture or dislocation. Unchanged appearance of acromioclavicular and glenohumeral osteoarthritis. No  erosive change. IMPRESSION: 1. No fracture or dislocation of the left shoulder. 2. Unchanged acromioclavicular and glenohumeral osteoarthritis. Electronically Signed   By: Narda Rutherford M.D.   On: 10/21/2023 03:06    PROCEDURES and INTERVENTIONS:  Procedures  Medications - No data to display   IMPRESSION / MDM / ASSESSMENT AND PLAN / ED COURSE  I reviewed the triage vital signs and the nursing notes.  Differential diagnosis includes, but is not limited to, fracture, dislocation, bruising, sprain or strain, seizure, stroke  {Patient presents with symptoms of an acute illness or injury that is potentially life-threatening.  72 year old male presents from his SNF after a fall with evidence of soft tissue injury suitable for outpatient management.  Baseline mental status and neuroexam after we confirm with his facility.  Plain film without fracture or dislocation.  He has some localized bruising.  No evidence of open injury.  Suitable for outpatient management.  Clinical Course as of 10/21/23 0346  Sun Oct 21, 2023  0325 RN calls his facility and confirms baseline mental status [DS]    Clinical Course User Index [DS] Delton Prairie,  MD     FINAL CLINICAL IMPRESSION(S) / ED DIAGNOSES   Final diagnoses:  Fall, initial encounter  Left arm pain     Rx / DC Orders   ED Discharge Orders     None        Note:  This document was prepared using Dragon voice recognition software and may include unintentional dictation errors.   Delton Prairie, MD 10/21/23 [phone]

## 2023-10-21 NOTE — Discharge Instructions (Signed)
No signs of fracture on Xray

## 2023-10-21 NOTE — ED Notes (Signed)
called to [facility] for pt transport to facility/[facility].

## 2023-11-11 ENCOUNTER — Inpatient Hospital Stay: Payer: Medicare HMO

## 2023-11-11 ENCOUNTER — Inpatient Hospital Stay
Admission: EM | Admit: 2023-11-11 | Discharge: 2023-11-13 | DRG: 871 | Disposition: A | Discharge status: Hospice | Payer: Medicare HMO | Source: Skilled Nursing Facility | Attending: Internal Medicine | Admitting: Internal Medicine

## 2023-11-11 ENCOUNTER — Other Ambulatory Visit: Payer: Self-pay

## 2023-11-11 ENCOUNTER — Emergency Department: Payer: Medicare HMO

## 2023-11-11 DIAGNOSIS — Z681 Body mass index (BMI) 19 or less, adult: Secondary | ICD-10-CM | POA: Diagnosis not present

## 2023-11-11 DIAGNOSIS — J189 Pneumonia, unspecified organism: Secondary | ICD-10-CM

## 2023-11-11 DIAGNOSIS — F0390 Unspecified dementia without behavioral disturbance: Secondary | ICD-10-CM | POA: Diagnosis present

## 2023-11-11 DIAGNOSIS — D496 Neoplasm of unspecified behavior of brain: Secondary | ICD-10-CM | POA: Diagnosis not present

## 2023-11-11 DIAGNOSIS — Z66 Do not resuscitate: Secondary | ICD-10-CM | POA: Diagnosis not present

## 2023-11-11 DIAGNOSIS — Z79899 Other long term (current) drug therapy: Secondary | ICD-10-CM | POA: Diagnosis not present

## 2023-11-11 DIAGNOSIS — Z86011 Personal history of benign neoplasm of the brain: Secondary | ICD-10-CM | POA: Diagnosis not present

## 2023-11-11 DIAGNOSIS — E119 Type 2 diabetes mellitus without complications: Secondary | ICD-10-CM | POA: Diagnosis present

## 2023-11-11 DIAGNOSIS — J9601 Acute respiratory failure with hypoxia: Secondary | ICD-10-CM | POA: Diagnosis present

## 2023-11-11 DIAGNOSIS — R6520 Severe sepsis without septic shock: Secondary | ICD-10-CM | POA: Diagnosis present

## 2023-11-11 DIAGNOSIS — R569 Unspecified convulsions: Secondary | ICD-10-CM

## 2023-11-11 DIAGNOSIS — Z803 Family history of malignant neoplasm of breast: Secondary | ICD-10-CM

## 2023-11-11 DIAGNOSIS — E1169 Type 2 diabetes mellitus with other specified complication: Secondary | ICD-10-CM | POA: Diagnosis not present

## 2023-11-11 DIAGNOSIS — Z1152 Encounter for screening for COVID-19: Secondary | ICD-10-CM | POA: Diagnosis not present

## 2023-11-11 DIAGNOSIS — G40909 Epilepsy, unspecified, not intractable, without status epilepticus: Secondary | ICD-10-CM | POA: Diagnosis present

## 2023-11-11 DIAGNOSIS — Z87891 Personal history of nicotine dependence: Secondary | ICD-10-CM | POA: Diagnosis not present

## 2023-11-11 DIAGNOSIS — G9341 Metabolic encephalopathy: Secondary | ICD-10-CM | POA: Diagnosis present

## 2023-11-11 DIAGNOSIS — Z8679 Personal history of other diseases of the circulatory system: Secondary | ICD-10-CM

## 2023-11-11 DIAGNOSIS — A419 Sepsis, unspecified organism: Principal | ICD-10-CM

## 2023-11-11 DIAGNOSIS — Z515 Encounter for palliative care: Secondary | ICD-10-CM | POA: Diagnosis not present

## 2023-11-11 DIAGNOSIS — G9340 Encephalopathy, unspecified: Secondary | ICD-10-CM

## 2023-11-11 DIAGNOSIS — R5381 Other malaise: Secondary | ICD-10-CM | POA: Diagnosis present

## 2023-11-11 DIAGNOSIS — M791 Myalgia, unspecified site: Secondary | ICD-10-CM

## 2023-11-11 DIAGNOSIS — Z833 Family history of diabetes mellitus: Secondary | ICD-10-CM

## 2023-11-11 DIAGNOSIS — G8114 Spastic hemiplegia affecting left nondominant side: Secondary | ICD-10-CM | POA: Diagnosis present

## 2023-11-11 DIAGNOSIS — R64 Cachexia: Secondary | ICD-10-CM | POA: Diagnosis present

## 2023-11-11 DIAGNOSIS — Z794 Long term (current) use of insulin: Secondary | ICD-10-CM | POA: Diagnosis not present

## 2023-11-11 LAB — AMMONIA: Ammonia: 11 umol/L (ref 9–35)

## 2023-11-11 LAB — CBC WITH DIFFERENTIAL/PLATELET
Abs Immature Granulocytes: 0.03 10*3/uL (ref 0.00–0.07)
Basophils Absolute: 0 10*3/uL (ref 0.0–0.1)
Basophils Relative: 0 %
Eosinophils Absolute: 0 10*3/uL (ref 0.0–0.5)
Eosinophils Relative: 0 %
HCT: 42.9 % (ref 39.0–52.0)
Hemoglobin: 13.8 g/dL (ref 13.0–17.0)
Immature Granulocytes: 0 %
Lymphocytes Relative: 3 %
Lymphs Abs: 0.3 10*3/uL — ABNORMAL LOW (ref 0.7–4.0)
MCH: 31.9 pg (ref 26.0–34.0)
MCHC: 32.2 g/dL (ref 30.0–36.0)
MCV: 99.3 fL (ref 80.0–100.0)
Monocytes Absolute: 0.6 10*3/uL (ref 0.1–1.0)
Monocytes Relative: 6 %
Neutro Abs: 8.7 10*3/uL — ABNORMAL HIGH (ref 1.7–7.7)
Neutrophils Relative %: 91 %
Platelets: 226 10*3/uL (ref 150–400)
RBC: 4.32 MIL/uL (ref 4.22–5.81)
RDW: 13.3 % (ref 11.5–15.5)
WBC: 9.6 10*3/uL (ref 4.0–10.5)
nRBC: 0 % (ref 0.0–0.2)

## 2023-11-11 LAB — COMPREHENSIVE METABOLIC PANEL
ALT: 22 U/L (ref 0–44)
AST: 30 U/L (ref 15–41)
Albumin: 2.9 g/dL — ABNORMAL LOW (ref 3.5–5.0)
Alkaline Phosphatase: 111 U/L (ref 38–126)
Anion gap: 12 (ref 5–15)
BUN: 24 mg/dL — ABNORMAL HIGH (ref 8–23)
CO2: 27 mmol/L (ref 22–32)
Calcium: 8.1 mg/dL — ABNORMAL LOW (ref 8.9–10.3)
Chloride: 101 mmol/L (ref 98–111)
Creatinine, Ser: 0.83 mg/dL (ref 0.61–1.24)
GFR, Estimated: >60 mL/min (ref >60)
Glucose, Bld: 97 mg/dL (ref 70–99)
Potassium: 3.7 mmol/L (ref 3.5–5.1)
Sodium: 140 mmol/L (ref 135–145)
Total Bilirubin: 1 mg/dL (ref 0.0–1.2)
Total Protein: 5.8 g/dL — ABNORMAL LOW (ref 6.5–8.1)

## 2023-11-11 LAB — PROCALCITONIN: Procalcitonin: 0.65 ng/mL

## 2023-11-11 LAB — GLUCOSE, CAPILLARY
Glucose-Capillary: 87 mg/dL (ref 70–99)
Glucose-Capillary: 87 mg/dL (ref 70–99)

## 2023-11-11 LAB — BLOOD GAS, VENOUS
Acid-Base Excess: 7.5 mmol/L — ABNORMAL HIGH (ref 0.0–2.0)
Bicarbonate: 34.1 mmol/L — ABNORMAL HIGH (ref 20.0–28.0)
O2 Saturation: 43.1 %
Patient temperature: 37
pCO2, Ven: 55 mm[Hg] (ref 44–60)
pH, Ven: 7.4 (ref 7.25–7.43)
pO2, Ven: 34 mm[Hg] (ref 32–45)

## 2023-11-11 LAB — MRSA NEXT GEN BY PCR, NASAL: MRSA by PCR Next Gen: DETECTED — AB

## 2023-11-11 LAB — RESP PANEL BY RT-PCR (RSV, FLU A&B, COVID)  RVPGX2
Influenza A by PCR: NEGATIVE
Influenza B by PCR: NEGATIVE
Resp Syncytial Virus by PCR: NEGATIVE
SARS Coronavirus 2 by RT PCR: NEGATIVE

## 2023-11-11 LAB — HEMOGLOBIN A1C
Hgb A1c MFr Bld: 4.5 % — ABNORMAL LOW (ref 4.8–5.6)
Mean Plasma Glucose: 82.45 mg/dL

## 2023-11-11 LAB — LACTIC ACID, PLASMA
Lactic Acid, Venous: 1.3 mmol/L (ref 0.5–1.9)
Lactic Acid, Venous: 1.9 mmol/L (ref 0.5–1.9)

## 2023-11-11 LAB — PROTIME-INR
INR: 1.2 (ref 0.8–1.2)
Prothrombin Time: 15.5 s — ABNORMAL HIGH (ref 11.4–15.2)

## 2023-11-11 LAB — APTT: aPTT: 34 s (ref 24–36)

## 2023-11-11 MED ORDER — SODIUM CHLORIDE 0.9 % IV SOLN
2.0000 g | Freq: Once | INTRAVENOUS | Status: AC
  Administered 2023-11-11: 2 g via INTRAVENOUS
  Filled 2023-11-11: qty 20

## 2023-11-11 MED ORDER — LORAZEPAM 2 MG/ML IJ SOLN
0.5000 mg | INTRAMUSCULAR | Status: DC | PRN
  Filled 2023-11-11: qty 1

## 2023-11-11 MED ORDER — ACETAMINOPHEN 10 MG/ML IV SOLN
1000.0000 mg | Freq: Four times a day (QID) | INTRAVENOUS | Status: AC
  Administered 2023-11-11 – 2023-11-12 (×4): 1000 mg via INTRAVENOUS
  Filled 2023-11-11 (×5): qty 100

## 2023-11-11 MED ORDER — SODIUM CHLORIDE 0.9 % IV SOLN
500.0000 mg | INTRAVENOUS | Status: DC
  Filled 2023-11-11: qty 5

## 2023-11-11 MED ORDER — MORPHINE SULFATE (PF) 4 MG/ML IV SOLN: 4.0000 mg | INTRAVENOUS | Status: DC | PRN

## 2023-11-11 MED ORDER — INSULIN ASPART 100 UNIT/ML IJ SOLN: 0.0000 [IU] | Freq: Three times a day (TID) | INTRAMUSCULAR | Status: DC

## 2023-11-11 MED ORDER — LACTATED RINGERS IV SOLN: 150.0000 mL/h | INTRAVENOUS | Status: DC

## 2023-11-11 MED ORDER — ONDANSETRON HCL 4 MG PO TABS: 4.0000 mg | ORAL_TABLET | Freq: Four times a day (QID) | ORAL | Status: DC | PRN

## 2023-11-11 MED ORDER — ONDANSETRON HCL 4 MG/2ML IJ SOLN: 4.0000 mg | Freq: Four times a day (QID) | INTRAMUSCULAR | Status: DC | PRN

## 2023-11-11 MED ORDER — SODIUM CHLORIDE 0.9 % IV SOLN
100.0000 mg | Freq: Two times a day (BID) | INTRAVENOUS | Status: DC
  Administered 2023-11-11 – 2023-11-12 (×2): 100 mg via INTRAVENOUS
  Filled 2023-11-11 (×2): qty 10

## 2023-11-11 MED ORDER — MORPHINE SULFATE (PF) 2 MG/ML IV SOLN
2.0000 mg | Freq: Once | INTRAVENOUS | Status: AC
Start: 2023-11-11 — End: 2023-11-11
  Administered 2023-11-11: 2 mg via INTRAVENOUS
  Filled 2023-11-11: qty 1

## 2023-11-11 MED ORDER — LORAZEPAM 2 MG/ML IJ SOLN
0.5000 mg | Freq: Two times a day (BID) | INTRAMUSCULAR | Status: DC
  Administered 2023-11-11: 0.5 mg via INTRAVENOUS
  Filled 2023-11-11: qty 1

## 2023-11-11 MED ORDER — SODIUM CHLORIDE 0.9 % IV BOLUS
1000.0000 mL | Freq: Once | INTRAVENOUS | Status: AC
  Administered 2023-11-11: 1000 mL via INTRAVENOUS

## 2023-11-11 MED ORDER — ENOXAPARIN SODIUM 40 MG/0.4ML IJ SOSY
40.0000 mg | PREFILLED_SYRINGE | INTRAMUSCULAR | Status: DC
  Administered 2023-11-11: 40 mg via SUBCUTANEOUS
  Filled 2023-11-11: qty 0.4

## 2023-11-11 MED ORDER — INSULIN ASPART 100 UNIT/ML IJ SOLN: 0.0000 [IU] | INTRAMUSCULAR | Status: DC

## 2023-11-11 MED ORDER — SODIUM CHLORIDE 0.9 % IV SOLN
500.0000 mg | Freq: Once | INTRAVENOUS | Status: AC
  Administered 2023-11-11: 500 mg via INTRAVENOUS
  Filled 2023-11-11: qty 5

## 2023-11-11 MED ORDER — METHYLPREDNISOLONE SODIUM SUCC 40 MG IJ SOLR
40.0000 mg | Freq: Every day | INTRAMUSCULAR | Status: DC
  Administered 2023-11-11: 40 mg via INTRAVENOUS
  Filled 2023-11-11: qty 1

## 2023-11-11 MED ORDER — SODIUM CHLORIDE 0.9 % IV SOLN
2.0000 g | INTRAVENOUS | Status: DC
  Filled 2023-11-11: qty 20

## 2023-11-11 MED ORDER — LACTATED RINGERS IV BOLUS
1000.0000 mL | Freq: Once | INTRAVENOUS | Status: AC
  Administered 2023-11-11: 1000 mL via INTRAVENOUS

## 2023-11-11 NOTE — Assessment & Plan Note (Signed)
Blood sugar in in 90s Monitor

## 2023-11-11 NOTE — ED Notes (Signed)
RN to bedside to introduce self to pt. Pt resting, mittens in place, sitter st bedside and on BIPAP. Pt has order for CCMD. I called and pt was not on monitor. This RN placed pt on CCMD board.

## 2023-11-11 NOTE — Assessment & Plan Note (Addendum)
History of multiple brain tumors Right brain aneurysm, status post right craniotomy in 1992 Noted History of parasagittal large size meningioma resection,  History of benign cervical subdural tumor resection in 2005

## 2023-11-11 NOTE — Assessment & Plan Note (Signed)
Looks to be on  lamotrigine titrating to 100 mg twice a day, Vimpat 100 twice a day from 05/2023 Neurology noted  Will place on IV vimpat given sepsis,hypoxia and concern for aspiration

## 2023-11-11 NOTE — Progress Notes (Signed)
NAME:  Patrick Buchanan, MRN:  [id number], DOB:  15-May-1951, LOS: 0 ADMISSION DATE:  11/11/2023,  History of Present Illness:  Case of 73 year old male patient with a past medical history of multiple brain tumor s/p resection, spastic hemiparesis of left nondominant side, type 2 diabetes mellitus, seizure disorder, encephalopathy and dementia presenting from SNF with 1 week of worsening encephalopathy and shortness of breath.  In the ED he was found to be febrile up to 101.  And acute hypoxic respiratory failure satting at 70% on room air initially on nonrebreather but then switched to BiPAP.  He improved on BiPAP but could not tolerated due to agitation and therefore was switched back to high flow nasal cannula.  Labs with WBC 9.6 with neutrophilic predominance.  Kidney function at baseline with creatinine of 0.83 mg/dL.  Otherwise unremarkable.  CT chest without contrast with diffuse bronchial wall thickening scattered tree-in-bud nodularity.  And more noted noticeable dense airspace consolidation in the right lower lobe.  Patient was started on ceftriaxone azithromycin.  However he is on BiPAP at 100% satting 92%.  Pertinent  Medical History  As above  Significant Hospital Events: Including procedures, antibiotic start and stop dates in addition to other pertinent events   11/11/2023 admitted to the ICU  Objective   Blood pressure (!) 104/56, pulse (!) 111, temperature (!) 102 F (38.9 C), temperature source Axillary, resp. rate (!) 29, SpO2 95%.    FiO2 (%):  [95 %-100 %] 100 %  No intake or output data in the 24 hours ending 11/11/23 1820 There were no vitals filed for this visit.  Examination: General: Elderly cachectic HENT: Supple neck, reactive pupils Lungs: Coarse breath sounds bilaterally Cardiovascular: Tachycardic, normal S1 normal S2 Abdomen: Soft nontender nondistended Extremities: Warm no edema appears dry.  Labs and imaging were reviewed  Assessment & Plan:   Case of 73 year old male patient with a past medical history of multiple brain tumor s/p resection, spastic hemiparesis of left nondominant side, type 2 diabetes mellitus, seizure disorder, encephalopathy and dementia presenting from SNF with 1 week of worsening encephalopathy and shortness of breath.  # Acute hypoxic respiratory failure secondary to # Community-acquired pneumonia # History of multiple brain tumors status postresection complicated by spastic hemiparesis as well as seizure disorder.  Neuro: Can use Precedex overnight as anxiolytic.  Would avoid any benzodiazepine.  Can use morphine 2 mg IV for increased work of breathing.  Will need to restart his home medication in the a.m. if tolerated.  Consult pharmacy for conversion of p.o. antiepileptic to IV antiepileptic. CVS: 1 L LR bolus. Pulmonary: Support with BiPAP for SpO2 goal greater than 90%.  Ceftriaxone azithromycin for antibiotic coverage.  MRSA swab.  Urine strep and Legionella.  Sputum culture if able. GI n.p.o. for now.  Laxatives as needed. Renal monitor urine output 1 L LR for soft blood pressures. Heme Lovenox for DVT prophylaxis Endo POC's 1 40-1 80. ID antibiotics as above.  Best Practice (right click and "Reselect all SmartList Selections" daily)   Diet/type: NPO DVT prophylaxis prophylactic heparin  Pressure ulcer(s): N/A GI prophylaxis: N/A Lines: N/A Foley:  N/A Code Status:  limited DNR - OK to intubate.   I spent 60 minutes caring for this patient today, including preparing to see the patient, obtaining a medical history , reviewing a separately obtained history, performing a medically appropriate examination and/or evaluation, counseling and educating the patient/family/caregiver, ordering medications, tests, or procedures, documenting clinical information in the electronic health record,  and independently interpreting results (not separately reported/billed) and communicating results to the  patient/family/caregiver  Janann Colonel, MD [facility] 11/11/2023 6:33 PM

## 2023-11-11 NOTE — Consult Note (Signed)
CODE SEPSIS - PHARMACY COMMUNICATION  **Broad Spectrum Antibiotics should be administered within 1 hour of Sepsis diagnosis**  Time Code Sepsis Called/Page Received: 1355  Antibiotics Ordered: Azithromycin and Ceftriaxone  Time of 1st antibiotic administration: Ceftriaxone 1411  Additional action taken by pharmacy: none  If necessary, Name of Provider/Nurse Contacted: n/a   Rosaly Labarbera Rodriguez-Guzman PharmD, BCPS 11/11/2023 2:02 PM

## 2023-11-11 NOTE — ED Provider Notes (Signed)
[facility] Provider Note    Event Date/Time   First MD Initiated Contact with Patient 11/11/23 1500     (approximate)   History   Respiratory Distress Level V Caveat:  ams  HPI  Patrick Buchanan is a 73 y.o. male with advanced dementia presenting from a facility for shortness of breath fever as a sepsis alert.  Patient unable to provide much additional history.     Physical Exam   Triage Vital Signs: ED Triage Vitals  Encounter Vitals Group     BP 11/11/23 1348 (!) 133/117     Systolic BP Percentile --      Diastolic BP Percentile --      Pulse Rate 11/11/23 1400 (!) 123     Resp 11/11/23 1348 (!) 27     Temp 11/11/23 1400 (!) 102.2 F (39 C)     Temp Source 11/11/23 1400 Rectal     SpO2 11/11/23 1345 (!) 86 %     Weight --      Height --      Head Circumference --      Peak Flow --      Pain Score --      Pain Loc --      Pain Education --      Exclude from Growth Chart --     Most recent vital signs: Vitals:   11/11/23 1400 11/11/23 1430  BP: (!) 86/73 109/63  Pulse: (!) 123 (!) 120  Resp: (!) 29 (!) 25  Temp: (!) 102.2 F (39 C)   SpO2: 92% 100%     Constitutional: Disoriented x 3. Eyes: Conjunctivae are normal.  Head: Atraumatic. Nose: No congestion/rhinnorhea. Mouth/Throat: Mucous membranes are dry  Neck: Painless ROM.  Cardiovascular:   Tachycardic. Respiratory: Tachypnea with diffuse rhonchorous breath sounds particular in the right lung fields. Gastrointestinal: Soft and nontender.  Musculoskeletal:  no deformity Neurologic:  MAE spontaneously. No gross focal neurologic deficits are appreciated. Skin:  Skin is warm, dry and intact. No rash noted.     ED Results / Procedures / Treatments   Labs (all labs ordered are listed, but only abnormal results are displayed) Labs Reviewed  COMPREHENSIVE METABOLIC PANEL - Abnormal; Notable for the following components:      Result Value   BUN 24 (*)    Calcium 8.1  (*)    Total Protein 5.8 (*)    Albumin 2.9 (*)    All other components within normal limits  CBC WITH DIFFERENTIAL/PLATELET - Abnormal; Notable for the following components:   Neutro Abs 8.7 (*)    Lymphs Abs 0.3 (*)    All other components within normal limits  PROTIME-INR - Abnormal; Notable for the following components:   Prothrombin Time 15.5 (*)    All other components within normal limits  BLOOD GAS, VENOUS - Abnormal; Notable for the following components:   Bicarbonate 34.1 (*)    Acid-Base Excess 7.5 (*)    All other components within normal limits  RESP PANEL BY RT-PCR (RSV, FLU A&B, COVID)  RVPGX2  CULTURE, BLOOD (ROUTINE X 2)  CULTURE, BLOOD (ROUTINE X 2)  LACTIC ACID, PLASMA  APTT  LACTIC ACID, PLASMA  URINALYSIS, W/ REFLEX TO CULTURE (INFECTION SUSPECTED)     EKG  ED ECG REPORT I, Willy Eddy, the attending physician, personally viewed and interpreted this ECG.   Date: 11/11/2023  EKG Time: 13:54  Rate: 120  Rhythm: sinus  Axis: left  Intervals:  normal  ST&T Change:  no stemi, no depressions    RADIOLOGY Please see ED Course for my review and interpretation.  I personally reviewed all radiographic images ordered to evaluate for the above acute complaints and reviewed radiology reports and findings.  These findings were personally discussed with the patient.  Please see medical record for radiology report.    PROCEDURES:  Critical Care performed: Yes, see critical care procedure note(s)  .Critical Care  Performed by: Willy Eddy, MD Authorized by: Willy Eddy, MD   Critical care provider statement:    Critical care time (minutes):  35   Critical care was necessary to treat or prevent imminent or life-threatening deterioration of the following conditions:  Sepsis   Critical care was time spent personally by me on the following activities:  Ordering and performing treatments and interventions, ordering and review of laboratory  studies, ordering and review of radiographic studies, pulse oximetry, re-evaluation of patient's condition, review of old charts, obtaining history from patient or surrogate, examination of patient, evaluation of patient's response to treatment, discussions with primary provider, discussions with consultants and development of treatment plan with patient or surrogate    MEDICATIONS ORDERED IN ED: Medications  azithromycin (ZITHROMAX) 500 mg in sodium chloride 0.9 % 250 mL IVPB (500 mg Intravenous New Bag/Given 11/11/23 1436)  cefTRIAXone (ROCEPHIN) 2 g in sodium chloride 0.9 % 100 mL IVPB (2 g Intravenous New Bag/Given 11/11/23 1411)  sodium chloride 0.9 % bolus 1,000 mL (1,000 mLs Intravenous New Bag/Given 11/11/23 1410)  sodium chloride 0.9 % bolus 1,000 mL (1,000 mLs Intravenous New Bag/Given 11/11/23 1434)     IMPRESSION / MDM / ASSESSMENT AND PLAN / ED COURSE  I reviewed the triage vital signs and the nursing notes.                              Differential diagnosis includes, but is not limited to, Dehydration, sepsis, pna, uti, hypoglycemia, cva, drug effect, withdrawal, encephalitis  Patient presenting to the ER for evaluation of symptoms as described above.  Based on symptoms, risk factors and considered above differential, this presenting complaint could reflect a potentially life-threatening illness therefore the patient will be placed on continuous pulse oximetry and telemetry for monitoring.  Laboratory evaluation will be sent to evaluate for the above complaints.      Clinical Course as of 11/11/23 1521  Sun Nov 11, 2023  1406 Chest x-ray my review and interpretation is concerning for right-sided pneumonia will await formal radiology report. [PR]  1412 O2 sats improved.  Patient is mildly hypotensive receiving IV fluids as well as broad-spectrum antibiotics. [PR]  1445 Blood pressure improved after IV hydration.  Satting well on BiPAP. [PR]  1500 Admit for sepsis and pneumonia.  Hypoxia on BiPAP.  [SM]    Clinical Course User Index [PR] Willy Eddy, MD [SM] Corena Herter, MD     FINAL CLINICAL IMPRESSION(S) / ED DIAGNOSES   Final diagnoses:  Acute respiratory failure with hypoxia (HCC)  Sepsis with acute hypoxic respiratory failure without septic shock, due to unspecified organism [facility])     Rx / DC Orders   ED Discharge Orders     None        Note:  This document was prepared using Dragon voice recognition software and may include unintentional dictation errors.    Willy Eddy, MD 11/11/23 [phone]

## 2023-11-11 NOTE — Assessment & Plan Note (Signed)
Decompensated respiratory failure requiring BiPAP in the setting of sepsis ?  Pneumonia on imaging-CT of the chest pending to better assess Some concern for possible aspiration event IV Rocephin azithromycin for infectious coverage Blood and respiratory cultures Continue BiPAP and supplemental oxygen as needed Monitor

## 2023-11-11 NOTE — Progress Notes (Signed)
Elink following for sepsis protocol. 

## 2023-11-11 NOTE — H&P (Addendum)
History and Physical    Patient: Patrick Buchanan ZDG:[id number] DOB: 1951-09-23 DOA: 11/11/2023 DOS: the patient was seen and examined on 11/11/2023 PCP: Mayer Masker, PA-C (Inactive)  Patient coming from: Home  Chief Complaint:  Chief Complaint  Patient presents with   Respiratory Distress   HPI: Patrick Buchanan is a 73 y.o. male with medical history significant of multiple brain tumor s/p resection, spastic hemiparesis of left nondominant side, type 2 diabetes, seizure disorder, encephalopathy presenting with sepsis, acute respiratory failure with hypoxia.  History primarily from staff at [employer] [employer].  Per report, patient with increased work of breathing at facility.  No reports of fevers or chills.  No reports of nausea or vomiting.  Unclear about p.o. intake.  No reports of abdominal pain or diarrhea.  Had worsening shortness of breath.  Patient was confused.  EMS called with patient having be placed on a nonrebreather at 5 L.  Satting in the mid 70s on room air. Presented to the ER Tmax of 102.2, heart rate into the 120s, blood pressure 80s to 130s over 70s to 110s.  Initially satting in mid 80s on room air.  Now on BiPAP.  White count 9.6, hemoglobin 13.8, platelets 226, COVID flu and RSV negative.  Creatinine 0.83.  VBG stable.  Chest x-ray with nonspecific right-sided changes.  CT of the chest ordered to better assess. Review of Systems: As mentioned in the history of present illness. All other systems reviewed and are negative. Past Medical History:  Diagnosis Date   Allergy    hay fever   Blood transfusion without reported diagnosis    Cerebral aneurysm 1992   "leaking"; treated by Dr Newell Coral   Depression    Diabetes mellitus without complication (HCC)    type 2   GERD (gastroesophageal reflux disease)    past hx    Past Surgical History:  Procedure Laterality Date   APPENDECTOMY     CEREBRAL ANEURYSM REPAIR  1992   clipping by Dr Newell Coral; cannot have an MRI    CRANIOTOMY Bilateral 02/27/2022   Procedure: Craniotomy - bilateral - Parietal;  Surgeon: Julio Sicks, MD;  Location: [facility] OR;  Service: Neurosurgery;  Laterality: Bilateral;   HERNIA REPAIR N/A 1990   inguinal   INGUINAL HERNIA REPAIR     as a child and then another at 65 yrs old   TONSILLECTOMY     TUMOR EXCISION  2005   benign cervical   Social History:  reports that he quit smoking about 20 years ago. His smoking use included cigarettes. He started smoking about 30 years ago. He has a 10 pack-year smoking history. He has never used smokeless tobacco. He reports that he does not currently use alcohol. He reports that he does not currently use drugs.  No Active Allergies  Family History  Problem Relation Age of Onset   Breast cancer Mother    Diabetes Brother    Colon cancer Neg Hx    Colon polyps Neg Hx    Esophageal cancer Neg Hx    Rectal cancer Neg Hx    Stomach cancer Neg Hx     Prior to Admission medications   Medication Sig Start Date End Date Taking? Authorizing Provider  acetaminophen (TYLENOL) 500 MG tablet Take 1,000 mg by mouth 2 (two) times daily.    [provider]  ALPRAZolam Prudy Feeler) 0.5 MG tablet Take 1 tablet (0.5 mg total) by mouth 2 (two) times daily as needed for anxiety. 05/13/22  Leroy Sea, MD  ARIPiprazole (ABILIFY) 10 MG tablet Take 10 mg by mouth daily. 06/09/22   [provider]  baclofen (LIORESAL) 10 MG tablet Take 1 tablet (10 mg total) by mouth 3 (three) times daily. 04/24/22   Carlean Jews, NP  bisacodyl (DULCOLAX) 10 MG suppository Place 1 suppository (10 mg total) rectally daily as needed for moderate constipation. 05/13/22   Leroy Sea, MD  cephALEXin (KEFLEX) 500 MG capsule Take 1 capsule (500 mg total) by mouth 3 (three) times daily. 09/02/22   Garlon Hatchet, PA-C  Cholecalciferol (VITAMIN D3 PO) Take 1 capsule by mouth daily.    [provider]  diclofenac Sodium (VOLTAREN) 1 % GEL Apply 2 g topically  3 (three) times daily. 03/30/22   Setzer, Lynnell Jude, PA-C  escitalopram (LEXAPRO) 20 MG tablet Take 1 tablet (20 mg total) by mouth daily. 04/24/22   Carlean Jews, NP  gabapentin (NEURONTIN) 100 MG capsule Take 1 capsule (100 mg total) by mouth 2 (two) times daily. 04/24/22   Carlean Jews, NP  gabapentin (NEURONTIN) 300 MG capsule Take 300 mg by mouth daily. 05/16/22   [provider]  gabapentin (NEURONTIN) 300 MG capsule Take 1 capsule (300 mg total) by mouth 3 (three) times daily for 5 days. 09/22/22 09/27/22  Terald Sleeper, MD  HYDROcodone-acetaminophen (NORCO/VICODIN) 5-325 MG tablet Take by mouth. 05/17/22   [provider]  ketoconazole (NIZORAL) 2 % cream Apply 1 Application topically daily. 06/14/22   [provider]  ketoconazole (NIZORAL) 2 % shampoo Apply 1 Application topically. Every 20 days 06/14/22   [provider]  Lacosamide 100 MG TABS Take 1 tablet (100 mg total) by mouth 2 (two) times daily. 01/02/23   Levert Feinstein, MD  lamoTRIgine (LAMICTAL) 100 MG tablet Take 1 tablet (100 mg total) by mouth 2 (two) times daily. 01/02/23   Levert Feinstein, MD  linaclotide [facility]) 145 MCG CAPS capsule Take 1 capsule (145 mcg total) by mouth daily before breakfast. 04/24/22   Carlean Jews, NP  magnesium gluconate (MAGONATE) 500 MG tablet Take 0.5 tablets (250 mg total) by mouth at bedtime. Patient taking differently: Take 250 mg by mouth every other day. 04/24/22   Carlean Jews, NP  magnesium hydroxide (MILK OF MAGNESIA) 400 MG/5ML suspension Take 30 mLs by mouth daily as needed for mild constipation.    [provider]  Multiple Vitamin (MULTIVITAMIN) tablet Take 1 tablet by mouth daily.    [provider]  pantoprazole (PROTONIX) 40 MG tablet Take 1 tablet (40 mg total) by mouth at bedtime. 04/24/22   Carlean Jews, NP  polyvinyl alcohol (LIQUIFILM TEARS) 1.4 % ophthalmic solution Place 1 drop into both eyes as needed for dry eyes.     [provider]  QUEtiapine (SEROQUEL) 50 MG tablet Take 1 tablet (50 mg total) by mouth at bedtime. 05/13/22   Leroy Sea, MD  rosuvastatin (CRESTOR) 20 MG tablet Take 1 tablet (20 mg total) by mouth daily. 04/24/22   Carlean Jews, NP  senna-docusate (SENOKOT-S) 8.6-50 MG tablet Take 2 tablets by mouth at bedtime. 03/30/22   Setzer, Lynnell Jude, PA-C  tamsulosin (FLOMAX) 0.4 MG CAPS capsule Take 1 capsule (0.4 mg total) by mouth daily after supper. 05/13/22   Leroy Sea, MD  traZODone (DESYREL) 50 MG tablet Take 0.5-1 tablets (25-50 mg total) by mouth at bedtime as needed for sleep. 04/24/22   Carlean Jews, NP  Vitamin D, Ergocalciferol, (DRISDOL) 1.25 MG (50000 UNIT) CAPS capsule Take 1 capsule (50,000 Units total) by mouth every Sunday. Take one tablet wkly 04/30/22   Carlean Jews, NP    Physical Exam: Vitals:   11/11/23 1347 11/11/23 1348 11/11/23 1400 11/11/23 1430  BP:  (!) 133/117 (!) 78/29 109/63  Pulse:   (!) 123 (!) 120  Resp:  (!) 27 (!) 29 (!) 25  Temp:   (!) 102.2 F (39 C)   TempSrc:   Rectal   SpO2: (!) 72% (!) 72% 92% 100%   Physical Exam Constitutional:      Comments: Cachectic    HENT:     Head: Normocephalic and atraumatic.     Nose: Nose normal.  Eyes:     Pupils: Pupils are equal, round, and reactive to light.  Cardiovascular:     Rate and Rhythm: Tachycardia present.  Pulmonary:     Breath sounds: Wheezing and rales present.  Abdominal:     General: Bowel sounds are normal.  Musculoskeletal:     Comments: + chronic contractures    Skin:    General: Skin is warm.  Neurological:     Comments: + altered state  + contractures  Minimally verbal + generalized confusion       Data Reviewed:  There are no new results to review at this time.  CT CHEST WO CONTRAST CLINICAL DATA:  Advanced dementia presenting with shortness of breath, fever and sepsis. Evaluate for respiratory illness.  EXAM: CT CHEST WITHOUT  CONTRAST  TECHNIQUE: Multidetector CT imaging of the chest was performed following the standard protocol without IV contrast.  RADIATION DOSE REDUCTION: This exam was performed according to the departmental dose-optimization program which includes automated exposure control, adjustment of the mA and/or kV according to patient size and/or use of iterative reconstruction technique.  COMPARISON:  None Available.  FINDINGS: Cardiovascular: Heart size is within normal limits. Aortic atherosclerosis. Coronary artery calcifications. No pericardial effusion.  Mediastinum/Nodes: Thyroid gland, trachea, and esophagus appear normal. No enlarged mediastinal, axillary or supraclavicular lymph nodes. Hilar lymph nodes are suboptimally evaluated due to lack of IV contrast material.  Lungs/Pleura: Asymmetric elevation of the right hemidiaphragm. No pleural effusion. Diffuse bronchial wall thickening. Scattered tree-in-bud nodularity identified within both lungs along with patchy areas of ground-glass attenuation. Interlobular thickening noted within the right middle lobe.  A few scattered nonspecific lung nodules are noted bilaterally. This includes right upper lobe lung nodule measuring 4 mm, image 59/2. Right middle lobe lung nodule measures 5 mm, image 101/2.  Dense airspace consolidation involving nearly the entire right lower lobe is identified with sparing of the superior segment. Some scattered air bronchograms identified within this area.  Upper Abdomen: No acute abnormality. Multiple gallstones identified measuring up to at least 1.5 cm, image 163/2.  Musculoskeletal: No chest wall mass or suspicious bone lesions identified.  IMPRESSION: 1. Dense airspace consolidation involving nearly the entire right lower lobe with sparing of the superior segment. Findings are compatible with pneumonia. This will require follow-up imaging to ensure complete resolution and to rule out  underlying malignancy. 2. Scattered tree-in-bud nodularity identified within both lungs along with patchy areas of ground-glass attenuation. Findings are favored to represent sequelae of infectious bronchiolitis. 3. A few scattered nonspecific lung nodules are noted bilaterally. These measure up to 5 mm and although these are likely postinflammatory/infectious follow-up imaging is advised to ensure resolution. 4. Coronary artery calcifications. 5. Cholelithiasis. 6.  Aortic Atherosclerosis (ICD10-I70.0).  Electronically Signed  By: Signa Kell M.D.   On: 11/11/2023 16:25 DG Chest Port 1 View CLINICAL DATA:  Possible sepsis.  Respiratory distress.  EXAM: PORTABLE CHEST 1 VIEW  COMPARISON:  09/28/2023  FINDINGS: Patient is moderately rotated to the right. Mild prominence of the right perihilar vessels which may be partly positional in nature versus asymmetric vascular congestion. Left lung is clear. No lobar consolidation or effusion. Cardiomediastinal silhouette and remainder of the exam is unchanged.  IMPRESSION: Mild prominence of the right perihilar vessels which may be partly positional in nature versus asymmetric vascular congestion.  Electronically Signed   By: Elberta Fortis M.D.   On: 11/11/2023 14:34  Lab Results  Component Value Date   WBC 9.6 11/11/2023   HGB 13.8 11/11/2023   HCT 42.9 11/11/2023   MCV 99.3 11/11/2023   PLT 226 11/11/2023   Last metabolic panel Lab Results  Component Value Date   GLUCOSE 97 11/11/2023   NA 140 11/11/2023   K 3.7 11/11/2023   CL 101 11/11/2023   CO2 27 11/11/2023   BUN 24 (H) 11/11/2023   CREATININE 0.83 11/11/2023   GFRNONAA >60 11/11/2023   CALCIUM 8.1 (L) 11/11/2023   PHOS 3.7 04/27/2022   PROT 5.8 (L) 11/11/2023   ALBUMIN 2.9 (L) 11/11/2023   LABGLOB 2.1 08/23/2021   AGRATIO 2.1 08/23/2021   BILITOT 1.0 11/11/2023   ALKPHOS 111 11/11/2023   AST 30 11/11/2023   ALT 22 11/11/2023   ANIONGAP 12  11/11/2023    Assessment and Plan: * Acute respiratory failure with hypoxia (HCC) Decompensated respiratory failure requiring BiPAP in the setting of sepsis ?  Pneumonia on imaging-CT of the chest pending to better assess Some concern for possible aspiration event IV Rocephin azithromycin for infectious coverage Blood and respiratory cultures Continue BiPAP and supplemental oxygen as needed Monitor  Sepsis (HCC) Meeting sepsis criteria with Tmax of 102.2, heart rate into the 100s White count 9.6 Lactate within normal limits Some concern for respiratory etiology in the setting of acute respiratory failure with hypoxia and?  Infiltrate on chest x-ray CT of the chest pending to better assess IV Rocephin azithromycin for infectious coverage Panculture LR maintenance IV fluids Procalcitonin   Diabetes mellitus (HCC) Blood sugar in in 90s Monitor    Encephalopathy Positive generalized confusion and agitation on evaluation Unclear general baseline in setting of multiple meningiomas with multiple brain surgeries in the past Suspect sepsis and toxic respiratory failure likely confounding issues Will defer CT head imaging given patient is had roughly 3 CT scans of the brain since November Will otherwise continue to monitor Reassess as appropriate Follow closely  Seizure disorder (HCC) Looks to be on  lamotrigine titrating to 100 mg twice a day, Vimpat 100 twice a day from 05/2023 Neurology noted  Will place on IV vimpat given sepsis,hypoxia and concern for aspiration    Brain tumor (HCC) History of multiple brain tumors Right brain aneurysm, status post right craniotomy in 1992 Noted History of parasagittal large size meningioma resection,  History of benign cervical subdural tumor resection in 2005    Greater than 50% was spent in counseling and coordination of care with patient Critical Care time: 65 minutes or more   Advance Care Planning:   Code Status: Full Code    Consults: None   Family Communication: No family at the bedside   Severity of Illness: The appropriate patient status for this patient is INPATIENT. Inpatient status is judged to be reasonable and necessary in order  to provide the required intensity of service to ensure the patient's safety. The patient's presenting symptoms, physical exam findings, and initial radiographic and laboratory data in the context of their chronic comorbidities is felt to place them at high risk for further clinical deterioration. Furthermore, it is not anticipated that the patient will be medically stable for discharge from the hospital within 2 midnights of admission.   * I certify that at the point of admission it is my clinical judgment that the patient will require inpatient hospital care spanning beyond 2 midnights from the point of admission due to high intensity of service, high risk for further deterioration and high frequency of surveillance required.*  Author: Floydene Flock, MD 11/11/2023 4:44 PM  For on call review [email or web address].

## 2023-11-11 NOTE — IPAL (Signed)
  Interdisciplinary Goals of Care Family Meeting   Date carried out: 11/11/2023  Location of the meeting: Phone conference  Member's involved: Physician and Family Member or next of kin  Durable Power of Attorney or acting medical decision maker: 2 daughters [name] and [place].  Discussion: We discussed goals of care for Patrick Buchanan and I relayed to them that he is an septic shock secondary to a right lower lobe pneumonia.  He is in respiratory failure and is requiring BiPAP support.  They told me that he relayed to them he would does not want to be resuscitated but is okay for short-term intubation but does not want to be a long-term on artificial life support.  Therefore CODE STATUS was changed to DNR but okay to intubate.  Code status: DNR/ Ok to intubate.   Disposition: Continue current acute care  Time spent for the meeting: 15    Janann Colonel, MD  11/11/2023, 5:36 PM

## 2023-11-11 NOTE — Progress Notes (Signed)
Pt transported to CT on BIPAP, no issues, placed on HHFNC at 95%, tolerating at this time.

## 2023-11-11 NOTE — Assessment & Plan Note (Addendum)
Positive generalized confusion and agitation on evaluation Unclear general baseline in setting of multiple meningiomas with multiple brain surgeries in the past Suspect sepsis and toxic respiratory failure likely confounding issues Will defer CT head imaging given patient is had roughly 3 CT scans of the brain since November Will otherwise continue to monitor Reassess as appropriate Follow closely

## 2023-11-11 NOTE — ED Notes (Signed)
Pt was removed from [place] by RT and placed on high flow. Pt O2 sats dropped to 65% on the high flow. RT called to switch back over.

## 2023-11-11 NOTE — ED Triage Notes (Signed)
Pt in via [facility] from [facility]; call out for Respiratory distress.  Per EMS, facility had patient on NRB at 5L, 76& SpO2.  Patient arrives on NRB 15L, saturation up to 86%.    Patient with Dementia at baseline.  Patient confused, garbled words.  EDP, Roxan Hockey at bedside.

## 2023-11-11 NOTE — Assessment & Plan Note (Signed)
Meeting sepsis criteria with Tmax of 102.2, heart rate into the 100s White count 9.6 Lactate within normal limits Some concern for respiratory etiology in the setting of acute respiratory failure with hypoxia and?  Infiltrate on chest x-ray CT of the chest pending to better assess IV Rocephin azithromycin for infectious coverage Panculture LR maintenance IV fluids Procalcitonin

## 2023-11-11 NOTE — ED Notes (Signed)
Pt still febrile. Will ask MD for IV tylenol.

## 2023-11-11 NOTE — ED Notes (Signed)
RT at bedside to place patient on BiPap

## 2023-11-12 DIAGNOSIS — Z515 Encounter for palliative care: Secondary | ICD-10-CM | POA: Diagnosis not present

## 2023-11-12 DIAGNOSIS — E1169 Type 2 diabetes mellitus with other specified complication: Secondary | ICD-10-CM

## 2023-11-12 DIAGNOSIS — Z794 Long term (current) use of insulin: Secondary | ICD-10-CM

## 2023-11-12 DIAGNOSIS — D496 Neoplasm of unspecified behavior of brain: Secondary | ICD-10-CM

## 2023-11-12 DIAGNOSIS — G40909 Epilepsy, unspecified, not intractable, without status epilepticus: Secondary | ICD-10-CM

## 2023-11-12 DIAGNOSIS — R6520 Severe sepsis without septic shock: Secondary | ICD-10-CM

## 2023-11-12 DIAGNOSIS — J9601 Acute respiratory failure with hypoxia: Secondary | ICD-10-CM | POA: Diagnosis not present

## 2023-11-12 DIAGNOSIS — A419 Sepsis, unspecified organism: Secondary | ICD-10-CM | POA: Diagnosis not present

## 2023-11-12 LAB — COMPREHENSIVE METABOLIC PANEL
ALT: 20 U/L (ref 0–44)
AST: 23 U/L (ref 15–41)
Albumin: 2.1 g/dL — ABNORMAL LOW (ref 3.5–5.0)
Alkaline Phosphatase: 76 U/L (ref 38–126)
Anion gap: 7 (ref 5–15)
BUN: 26 mg/dL — ABNORMAL HIGH (ref 8–23)
CO2: 27 mmol/L (ref 22–32)
Calcium: 7.7 mg/dL — ABNORMAL LOW (ref 8.9–10.3)
Chloride: 108 mmol/L (ref 98–111)
Creatinine, Ser: 0.54 mg/dL — ABNORMAL LOW (ref 0.61–1.24)
GFR, Estimated: >60 mL/min (ref >60)
Glucose, Bld: 109 mg/dL — ABNORMAL HIGH (ref 70–99)
Potassium: 3.9 mmol/L (ref 3.5–5.1)
Sodium: 142 mmol/L (ref 135–145)
Total Bilirubin: 0.7 mg/dL (ref 0.0–1.2)
Total Protein: 4.4 g/dL — ABNORMAL LOW (ref 6.5–8.1)

## 2023-11-12 LAB — BLOOD CULTURE ID PANEL (REFLEXED) - BCID2

## 2023-11-12 LAB — CBC
HCT: 35.4 % — ABNORMAL LOW (ref 39.0–52.0)
Hemoglobin: 11.6 g/dL — ABNORMAL LOW (ref 13.0–17.0)
MCH: 32.6 pg (ref 26.0–34.0)
MCHC: 32.8 g/dL (ref 30.0–36.0)
MCV: 99.4 fL (ref 80.0–100.0)
Platelets: 158 10*3/uL (ref 150–400)
RBC: 3.56 MIL/uL — ABNORMAL LOW (ref 4.22–5.81)
RDW: 13.3 % (ref 11.5–15.5)
WBC: 7.5 10*3/uL (ref 4.0–10.5)
nRBC: 0 % (ref 0.0–0.2)

## 2023-11-12 LAB — HIV ANTIBODY (ROUTINE TESTING W REFLEX): HIV Screen 4th Generation wRfx: NONREACTIVE

## 2023-11-12 LAB — GLUCOSE, CAPILLARY
Glucose-Capillary: 94 mg/dL (ref 70–99)
Glucose-Capillary: 96 mg/dL (ref 70–99)
Glucose-Capillary: 99 mg/dL (ref 70–99)

## 2023-11-12 IMAGING — CT CT HEAD W/ CM
4 of 8 series · 16 of 47 positions shown, 18 images · IV contrast (agent unspecified)
Comparison: 02/10/2022

CLINICAL DATA: Postop

EXAM:
CT HEAD WITH CONTRAST
TECHNIQUE: Contiguous axial images were obtained from the base of the skull
through the vertex with intravenous contrast.

[Series 3: head without (person_name) · axial · non-contrast · 0.45mm/px · z∈[-97,+38]mm · 6 of 39 slices shown, 8 images]
[im 6/39  brain]
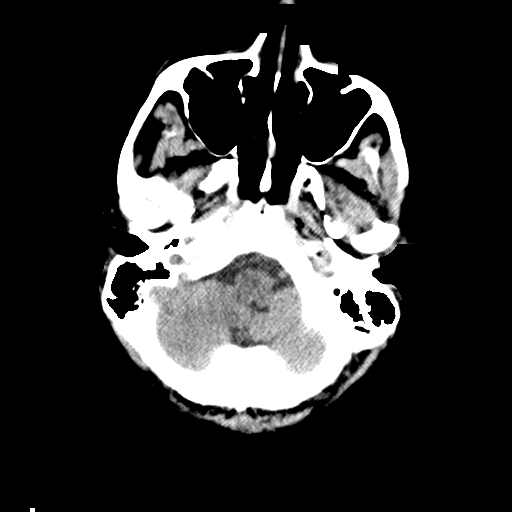
[im 6/39  bone]
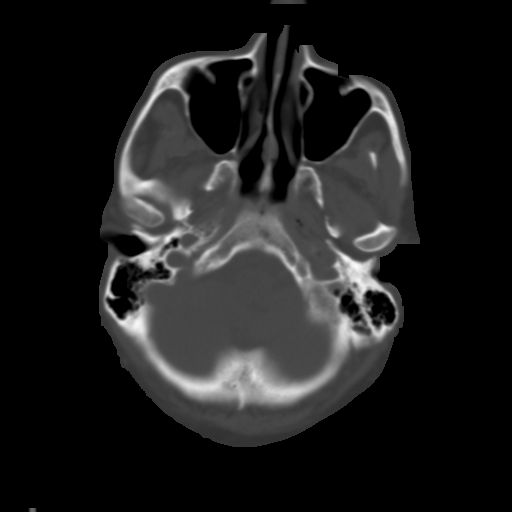
[im 11/39  brain]
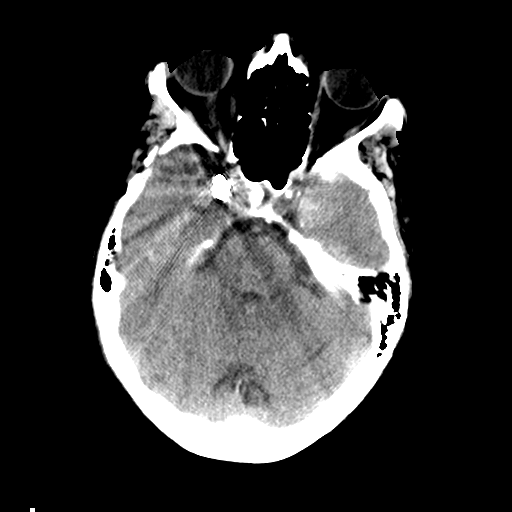
[im 17/39  brain]
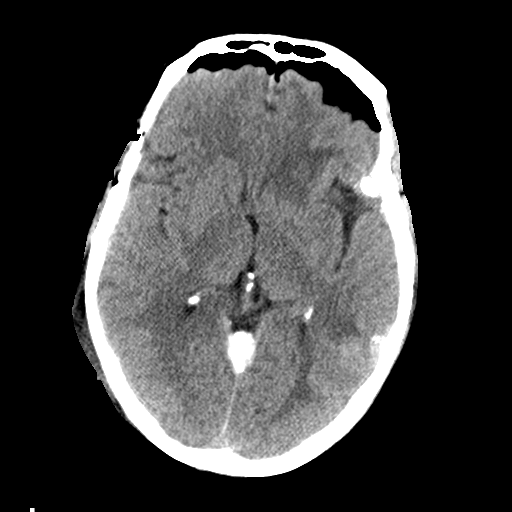
[im 22/39  brain]
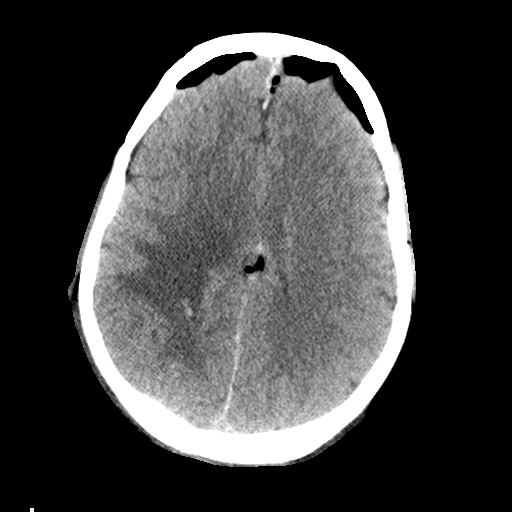
[im 28/39  brain]
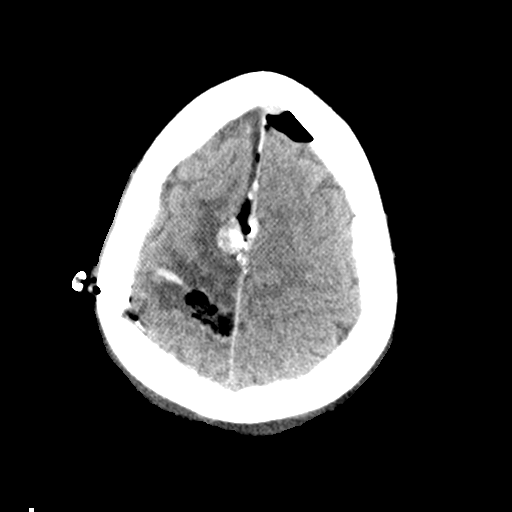
[im 28/39  bone]
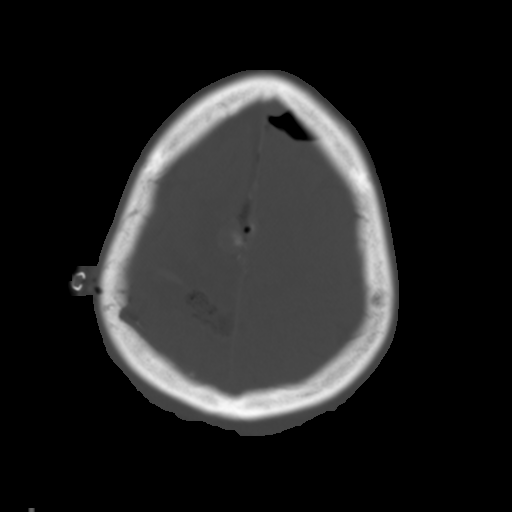
[im 33/39  brain]
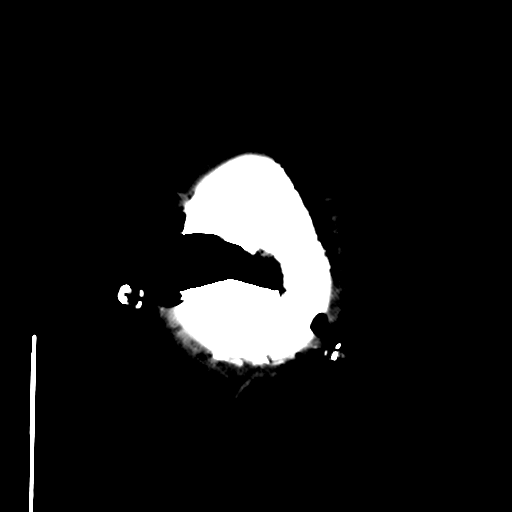

[Series 5: coronal · coronal · 0.37mm/px · 3 of 74 slices shown]
[im 19/74  brain]
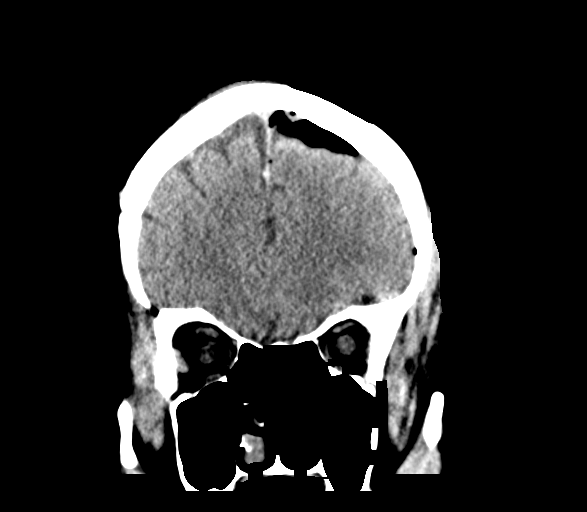
[im 37/74  brain]
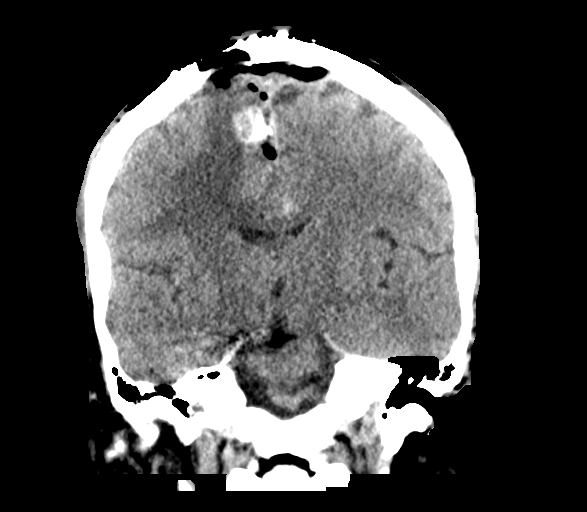
[im 55/74  brain]
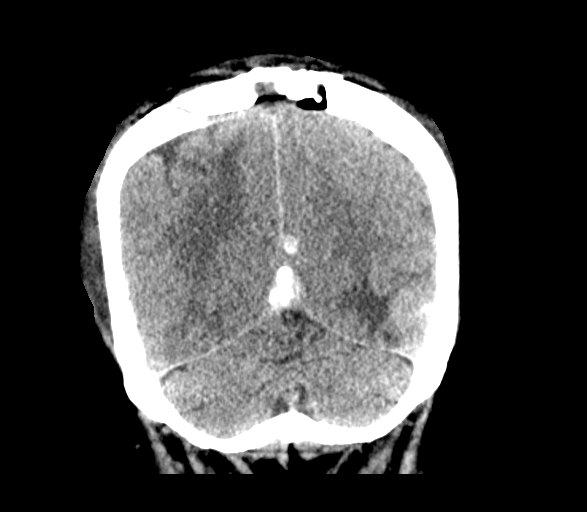

[Series 6: sagittal · sagittal · 0.37mm/px · 2 of 62 slices shown]
[im 21/62  brain]
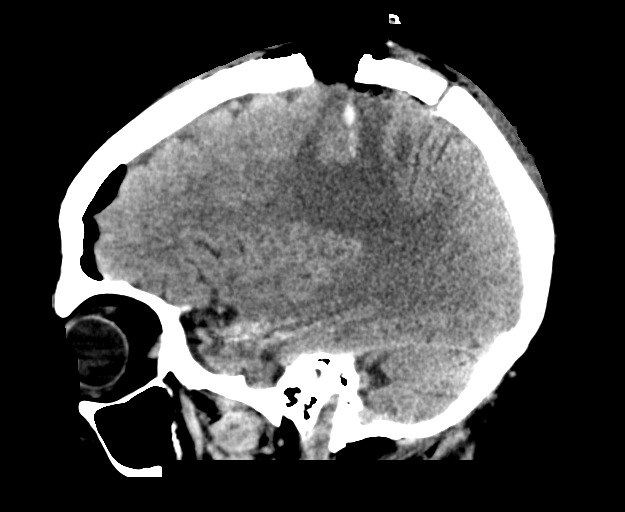
[im 41/62  brain]
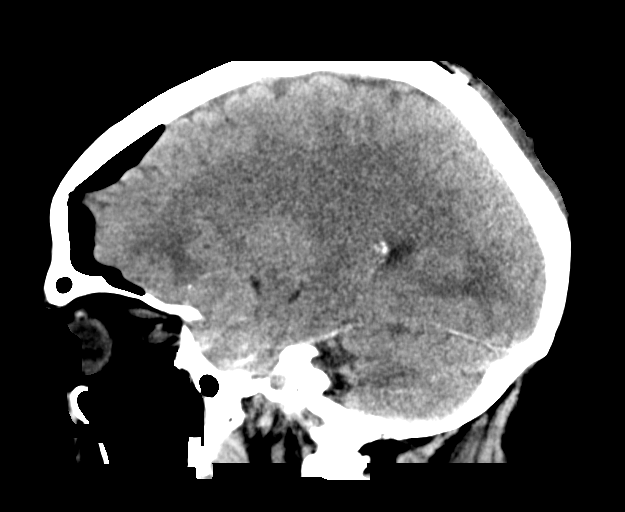

[Series 8: head with (person_name) · axial · 0.45mm/px · z∈[-97,+13]mm · 5 of 39 slices shown]
[im 6/39  brain]
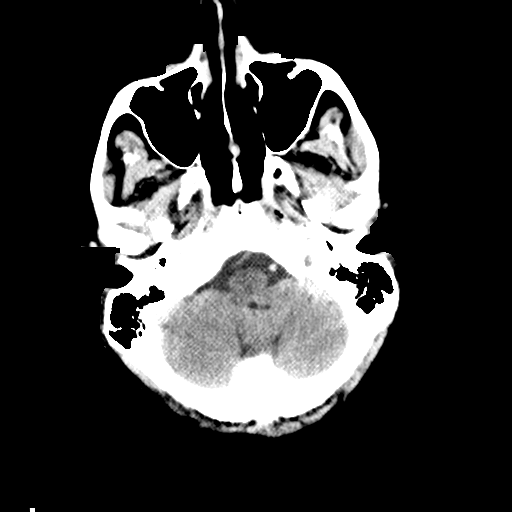
[im 11/39  brain]
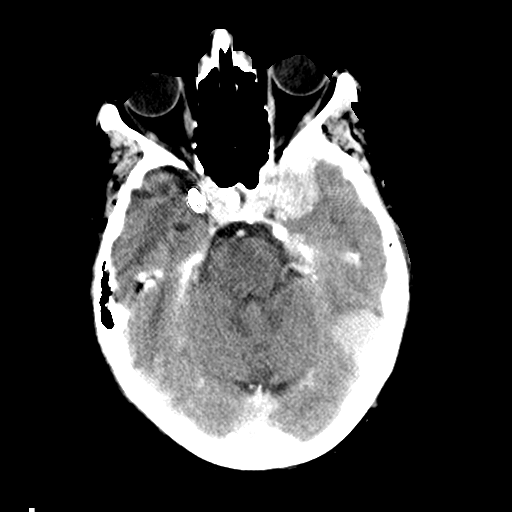
[im 17/39  brain]
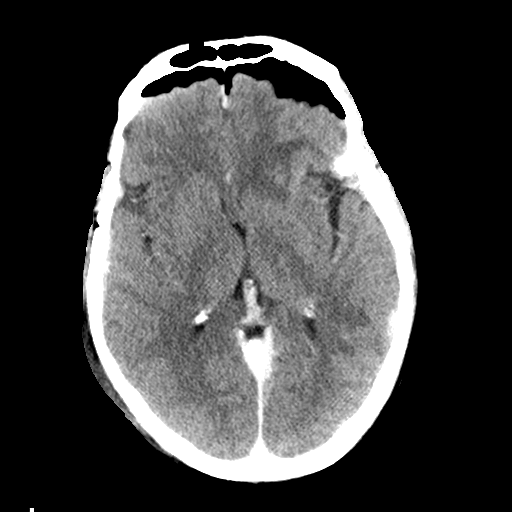
[im 22/39  brain]
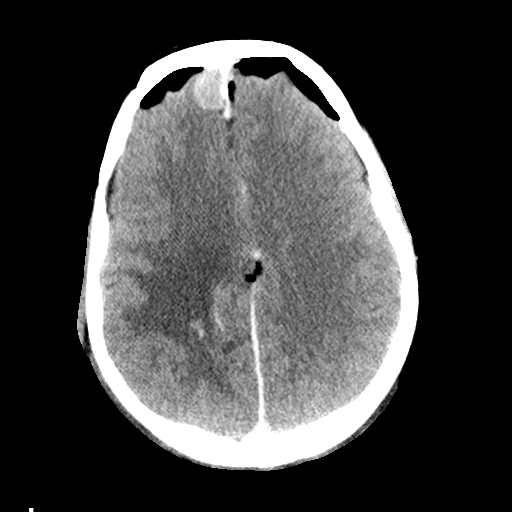
[im 28/39  brain]
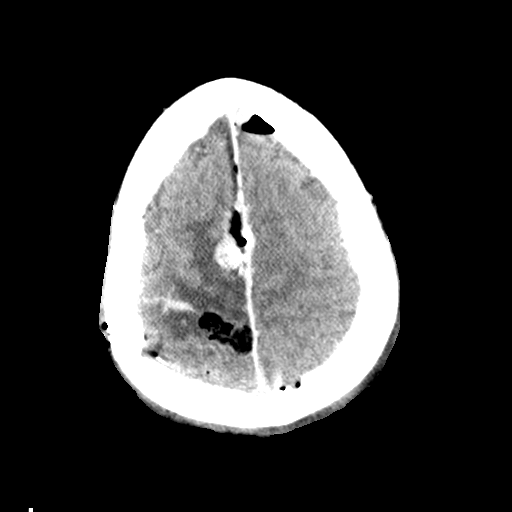

[16 of 47 positions shown; findings below may reference images not displayed]

RADIATION DOSE REDUCTION: This exam was performed according to the
departmental dose-optimization program which includes automated
exposure control, adjustment of the mA and/or kV according to
patient size and/or use of iterative reconstruction technique.

CONTRAST:  75mL OMNIPAQUE IOHEXOL 350 MG/ML SOLN
FINDINGS: Brain: Multiple extra-axial appearing brain masses as previously
described. Interval surgery at the largest mass at the right frontal
parietal vertex where there is similar vasogenic edema.
Pneumocephalus, expected. Expected postoperative regional swelling
and blood products in the subarachnoid space and inter hemispheric
fissure. No large territory acute infarct.

Stable or diminished vasogenic edema around left sphenoid wing and
left posterior temporal masses, the latter measuring up to 2.8 cm
and the former at least 3.2 cm.

Vascular: No acute vascular finding.  Right-sided aneurysm clipping

Skull: Recent craniotomy at the vertex. Remote right pterional
craniotomy.

Sinuses/Orbits: No acute finding
IMPRESSION: 1. No unexpected finding after right frontoparietal mass resection.
2. Similar or mildly decreased vasogenic edema around left posterior
temporal and sphenoid wing masses.

## 2023-11-12 MED ORDER — GLYCOPYRROLATE 0.2 MG/ML IJ SOLN: 0.2000 mg | INTRAMUSCULAR | Status: DC | PRN

## 2023-11-12 MED ORDER — GLYCOPYRROLATE 1 MG PO TABS: 1.0000 mg | ORAL_TABLET | ORAL | Status: DC | PRN

## 2023-11-12 MED ORDER — POLYVINYL ALCOHOL 1.4 % OP SOLN: 1.0000 [drp] | Freq: Four times a day (QID) | OPHTHALMIC | Status: DC | PRN

## 2023-11-12 MED ORDER — CHLORHEXIDINE GLUCONATE CLOTH 2 % EX PADS: 6.0000 | MEDICATED_PAD | Freq: Every day | CUTANEOUS | Status: DC

## 2023-11-12 MED ORDER — HALOPERIDOL LACTATE 5 MG/ML IJ SOLN
2.5000 mg | INTRAMUSCULAR | Status: DC | PRN
  Administered 2023-11-12: 5 mg via INTRAVENOUS
  Filled 2023-11-12: qty 1

## 2023-11-12 MED ORDER — ACETAMINOPHEN 650 MG RE SUPP: 650.0000 mg | Freq: Four times a day (QID) | RECTAL | Status: DC | PRN

## 2023-11-12 MED ORDER — ACETAMINOPHEN 325 MG PO TABS: 650.0000 mg | ORAL_TABLET | Freq: Four times a day (QID) | ORAL | Status: DC | PRN

## 2023-11-12 MED ORDER — SODIUM CHLORIDE 0.9 % IV SOLN: INTRAVENOUS | Status: AC

## 2023-11-12 MED ORDER — DIPHENHYDRAMINE HCL 50 MG/ML IJ SOLN: 25.0000 mg | INTRAMUSCULAR | Status: DC | PRN

## 2023-11-12 MED ORDER — MORPHINE SULFATE (PF) 2 MG/ML IV SOLN
INTRAVENOUS | Status: AC
  Filled 2023-11-12: qty 1

## 2023-11-12 MED ORDER — MORPHINE SULFATE (PF) 2 MG/ML IV SOLN
2.0000 mg | INTRAVENOUS | Status: DC | PRN
Start: 2023-11-12 — End: 2023-11-13
  Administered 2023-11-12 – 2023-11-13 (×5): 2 mg via INTRAVENOUS
  Filled 2023-11-12 (×5): qty 1

## 2023-11-12 MED ORDER — LORAZEPAM 2 MG/ML IJ SOLN
2.0000 mg | INTRAMUSCULAR | Status: DC | PRN
  Administered 2023-11-12 – 2023-11-13 (×4): 2 mg via INTRAVENOUS
  Administered 2023-11-13: 4 mg via INTRAVENOUS
  Filled 2023-11-12: qty 1
  Filled 2023-11-12: qty 2
  Filled 2023-11-12 (×3): qty 1

## 2023-11-12 MED ORDER — MORPHINE SULFATE (PF) 2 MG/ML IV SOLN
2.0000 mg | Freq: Once | INTRAVENOUS | Status: AC
  Administered 2023-11-12: 2 mg via INTRAVENOUS

## 2023-11-12 NOTE — Progress Notes (Signed)
1      PROGRESS NOTE    Patrick Buchanan  UEA:[id number] DOB: Oct 26, 1950 DOA: 11/11/2023 PCP: Mayer Masker, PA-C (Inactive)   Brief Narrative:   73 y.o. male with medical history significant of multiple brain tumor s/p resection, spastic hemiparesis of left nondominant side, type 2 diabetes, seizure disorder, encephalopathy presenting with sepsis, acute respiratory failure with hypoxia   1/20: Goals of care conversation with family.  Decision for full comfort care/hospice   Assessment & Plan:   Principal Problem:   Acute respiratory failure with hypoxia (HCC) Active Problems:   Sepsis (HCC)   Diabetes mellitus (HCC)   Hospice care   Brain tumor [facility])   Seizure disorder (HCC)   Encephalopathy   * Acute respiratory failure with hypoxia (HCC) Sepsis (HCC) Meeting sepsis criteria with Tmax of 102.2, heart rate into the 100s   Diabetes mellitus (HCC) Acute metabolic Encephalopathy Seizure disorder (HCC) Brain tumor (HCC) History of multiple brain tumors Right brain aneurysm, status post right craniotomy in 1992 Noted History of parasagittal large size meningioma resection,  History of benign cervical subdural tumor resection in 2005   Goals of care I had a long conversation with both daughters over the phone.  They shared that patient has been suffering and has had significant clinical and functional decline over last 6 months to 1 year.  They would like to keep him comfortable.  They are agreeable for hospice services at [facility].  I have restarted TOC to initiate hospice  DVT prophylaxis: Comfort care      Code Status: DNR Family Communication: Updated both daughters/POA over phone Disposition Plan: Possible discharge tomorrow with hospice/[facility]   Subjective:  Very confused and agitated.  Unable to provide any history  Objective: Vitals:   11/11/23 1938 11/12/23 0000 11/12/23 0400 11/12/23 0800  BP: (!) 87/69 (!) 78/58 (!) 80/60 91/63   Pulse: (!) 103 60 60 98  Resp: (!) 25 16 16 19   Temp: 99.5 F (37.5 C) 99 F (37.2 C) 98.5 F (36.9 C) 98.7 F (37.1 C)  TempSrc: Axillary Axillary Axillary Axillary  SpO2: 99% 100% 93% 93%  Weight:        Intake/Output Summary (Last 24 hours) at 11/12/2023 1209 Last data filed at 11/12/2023 0400 Gross per 24 hour  Intake --  Output 400 ml  Net -400 ml   Filed Weights   11/11/23 1821  Weight: 62.5 kg    Examination:  General exam: 73 year old cachectic looking male looks chronically ill Respiratory system: Rhonchi and rales at the bases bilaterally Cardiovascular system: S1 & S2 heard, RRR.  Tachycardic Gastrointestinal system: Abdomen is nondistended, soft and nontender. No organomegaly or masses felt. Normal bowel sounds heard. Central nervous system: Agitated, nonverbal.  Confused, spastic hemiparesis of the left/nondominant side Extremities: Spastic hemiparesis of the left side Skin: No rashes, lesions or ulcers Psychiatry: Agitated and confused    Data Reviewed: I have personally reviewed following labs and imaging studies  CBC: Recent Labs  Lab 11/11/23 1355 11/12/23 0234  WBC 9.6 7.5  NEUTROABS 8.7*  --   HGB 13.8 11.6*  HCT 42.9 35.4*  MCV 99.3 99.4  PLT 226 158   Basic Metabolic Panel: Recent Labs  Lab 11/11/23 1355 11/12/23 0234  NA 140 142  K 3.7 3.9  CL 101 108  CO2 27 27  GLUCOSE 97 109*  BUN 24* 26*  CREATININE 0.83 0.54*  CALCIUM 8.1* 7.7*   GFR: Estimated Creatinine Clearance: 76 mL/min (A) (  by C-G formula based on SCr of 0.54 mg/dL (L)). Liver Function Tests: Recent Labs  Lab 11/11/23 1355 11/12/23 0234  AST 30 23  ALT 22 20  ALKPHOS 111 76  BILITOT 1.0 0.7  PROT 5.8* 4.4*  ALBUMIN 2.9* 2.1*   No results for input(s): "LIPASE", "AMYLASE" in the last 168 hours. Recent Labs  Lab 11/11/23 1704  AMMONIA 11   Coagulation Profile: Recent Labs  Lab 11/11/23 1355  INR 1.2   Cardiac Enzymes: No results for input(s):  "CKTOTAL", "CKMB", "CKMBINDEX", "TROPONINI" in the last 168 hours. BNP (last 3 results) No results for input(s): "PROBNP" in the last 8760 hours. HbA1C: Recent Labs    11/11/23 1704  HGBA1C 4.5*   CBG: Recent Labs  Lab 11/11/23 1821 11/11/23 1928 11/11/23 2329 11/12/23 0408 11/12/23 0730  GLUCAP 94 87 87 96 99    Sepsis Labs: Recent Labs  Lab 11/11/23 1354 11/11/23 1704  PROCALCITON  --  0.65  LATICACIDVEN 1.3 1.9    Recent Results (from the past 240 hours)  Resp panel by RT-PCR (RSV, Flu A&B, Covid) Anterior Nasal Swab     Status: None   Collection Time: 11/11/23  1:55 PM   Specimen: Anterior Nasal Swab  Result Value Ref Range Status   SARS Coronavirus 2 by RT PCR NEGATIVE NEGATIVE Final    Comment: (NOTE) SARS-CoV-2 target nucleic acids are NOT DETECTED.  The SARS-CoV-2 RNA is generally detectable in upper respiratory specimens during the acute phase of infection. The lowest concentration of SARS-CoV-2 viral copies this assay can detect is 138 copies/mL. A negative result does not preclude SARS-Cov-2 infection and should not be used as the sole basis for treatment or other patient management decisions. A negative result may occur with  improper specimen collection/handling, submission of specimen other than nasopharyngeal swab, presence of viral mutation(s) within the areas targeted by this assay, and inadequate number of viral copies(<138 copies/mL). A negative result must be combined with clinical observations, patient history, and epidemiological information. The expected result is Negative.  Fact Sheet for Patients:  BloggerCourse.com  Fact Sheet for Healthcare Providers:  SeriousBroker.it  This test is no t yet approved or cleared by the [place] FDA and  has been authorized for detection and/or diagnosis of SARS-CoV-2 by FDA under an Emergency Use Authorization (EUA). This EUA will remain  in  effect (meaning this test can be used) for the duration of the COVID-19 declaration under Section 564(b)(1) of the Act, 21 U.S.C.section 360bbb-3(b)(1), unless the authorization is terminated  or revoked sooner.       Influenza A by PCR NEGATIVE NEGATIVE Final   Influenza B by PCR NEGATIVE NEGATIVE Final    Comment: (NOTE) The Xpert Xpress SARS-CoV-2/FLU/RSV plus assay is intended as an aid in the diagnosis of influenza from Nasopharyngeal swab specimens and should not be used as a sole basis for treatment. Nasal washings and aspirates are unacceptable for Xpert Xpress SARS-CoV-2/FLU/RSV testing.  Fact Sheet for Patients: BloggerCourse.com  Fact Sheet for Healthcare Providers: SeriousBroker.it  This test is not yet approved or cleared by the [place] FDA and has been authorized for detection and/or diagnosis of SARS-CoV-2 by FDA under an Emergency Use Authorization (EUA). This EUA will remain in effect (meaning this test can be used) for the duration of the COVID-19 declaration under Section 564(b)(1) of the Act, 21 U.S.C. section 360bbb-3(b)(1), unless the authorization is terminated or revoked.     Resp Syncytial Virus by PCR NEGATIVE NEGATIVE Final  Comment: (NOTE) Fact Sheet for Patients: BloggerCourse.com  Fact Sheet for Healthcare Providers: SeriousBroker.it  This test is not yet approved or cleared by the [place] FDA and has been authorized for detection and/or diagnosis of SARS-CoV-2 by FDA under an Emergency Use Authorization (EUA). This EUA will remain in effect (meaning this test can be used) for the duration of the COVID-19 declaration under Section 564(b)(1) of the Act, 21 U.S.C. section 360bbb-3(b)(1), unless the authorization is terminated or revoked.  Performed at [facility], [address] [place], [place] [address]   MRSA Next  Gen by PCR, Nasal     Status: Abnormal   Collection Time: 11/11/23  6:24 PM   Specimen: Nasal Mucosa; Nasal Swab  Result Value Ref Range Status   MRSA by PCR Next Gen DETECTED (A) NOT DETECTED Final    Comment: CRITICAL RESULT CALLED TO, READ BACK BY AND VERIFIED WITH: MARSHELL RICHARDS RN ICU @ 2045 11/11/2023 LFD (NOTE) The GeneXpert MRSA Assay (FDA approved for NASAL specimens only), is one component of a comprehensive MRSA colonization surveillance program. It is not intended to diagnose MRSA infection nor to guide or monitor treatment for MRSA infections. Test performance is not FDA approved in patients less than 12 years old. Performed at [facility], [address], [place], [place] [address]          Radiology Studies: CT CHEST WO CONTRAST Result Date: 11/11/2023 CLINICAL DATA:  Advanced dementia presenting with shortness of breath, fever and sepsis. Evaluate for respiratory illness. EXAM: CT CHEST WITHOUT CONTRAST TECHNIQUE: Multidetector CT imaging of the chest was performed following the standard protocol without IV contrast. RADIATION DOSE REDUCTION: This exam was performed according to the departmental dose-optimization program which includes automated exposure control, adjustment of the mA and/or kV according to patient size and/or use of iterative reconstruction technique. COMPARISON:  None Available. FINDINGS: Cardiovascular: Heart size is within normal limits. Aortic atherosclerosis. Coronary artery calcifications. No pericardial effusion. Mediastinum/Nodes: Thyroid gland, trachea, and esophagus appear normal. No enlarged mediastinal, axillary or supraclavicular lymph nodes. Hilar lymph nodes are suboptimally evaluated due to lack of IV contrast material. Lungs/Pleura: Asymmetric elevation of the right hemidiaphragm. No pleural effusion. Diffuse bronchial wall thickening. Scattered tree-in-bud nodularity identified within both lungs along with patchy areas of  ground-glass attenuation. Interlobular thickening noted within the right middle lobe. A few scattered nonspecific lung nodules are noted bilaterally. This includes right upper lobe lung nodule measuring 4 mm, image 59/2. Right middle lobe lung nodule measures 5 mm, image 101/2. Dense airspace consolidation involving nearly the entire right lower lobe is identified with sparing of the superior segment. Some scattered air bronchograms identified within this area. Upper Abdomen: No acute abnormality. Multiple gallstones identified measuring up to at least 1.5 cm, image 163/2. Musculoskeletal: No chest wall mass or suspicious bone lesions identified. IMPRESSION: 1. Dense airspace consolidation involving nearly the entire right lower lobe with sparing of the superior segment. Findings are compatible with pneumonia. This will require follow-up imaging to ensure complete resolution and to rule out underlying malignancy. 2. Scattered tree-in-bud nodularity identified within both lungs along with patchy areas of ground-glass attenuation. Findings are favored to represent sequelae of infectious bronchiolitis. 3. A few scattered nonspecific lung nodules are noted bilaterally. These measure up to 5 mm and although these are likely postinflammatory/infectious follow-up imaging is advised to ensure resolution. 4. Coronary artery calcifications. 5. Cholelithiasis. 6.  Aortic Atherosclerosis (ICD10-I70.0). Electronically Signed   By: Veronda Prude.D.  On: 11/11/2023 16:25   DG Chest Port 1 View Result Date: 11/11/2023 CLINICAL DATA:  Possible sepsis.  Respiratory distress. EXAM: PORTABLE CHEST 1 VIEW COMPARISON:  09/28/2023 FINDINGS: Patient is moderately rotated to the right. Mild prominence of the right perihilar vessels which may be partly positional in nature versus asymmetric vascular congestion. Left lung is clear. No lobar consolidation or effusion. Cardiomediastinal silhouette and remainder of the exam is unchanged.  IMPRESSION: Mild prominence of the right perihilar vessels which may be partly positional in nature versus asymmetric vascular congestion. Electronically Signed   By: Elberta Fortis M.D.   On: 11/11/2023 14:34        Scheduled Meds: Continuous Infusions:  sodium chloride 20 mL/hr at 11/12/23 7829   acetaminophen 1,000 mg (11/12/23 0626)     LOS: 1 day    Time spent: 35 minutes    Delfino Lovett, MD [facility] Pager [phone]  If 7PM-7AM, please contact night-coverage [email or web address]  11/12/2023, 12:09 PM

## 2023-11-12 NOTE — Progress Notes (Signed)
This nurse spoke to [place], patient's daughter, at number listed on file to notify her of transfer to room 102 on 1C.

## 2023-11-12 NOTE — IPAL (Signed)
  Interdisciplinary Goals of Care Family Meeting   Date carried out: 11/12/2023  Location of the meeting: Phone conference  Member's involved: Physician and Family Member or next of kin  Durable Power of Attorney or Environmental health practitioner: Daughters/[place]  Discussion: We discussed goals of care for [employer]   Code status:   Code Status: Do not attempt resuscitation (DNR) PRE-ARREST INTERVENTIONS DESIRED   Disposition: SNF/[facility] with hospice  Time spent for the meeting: 35 minutes    Delfino Lovett, MD  11/12/2023, 8:41 AM

## 2023-11-12 NOTE — Plan of Care (Signed)
  Problem: Metabolic: Goal: Ability to maintain appropriate glucose levels will improve Outcome: Progressing   Problem: Skin Integrity: Goal: Risk for impaired skin integrity will decrease Outcome: Progressing   Problem: Elimination: Goal: Will not experience complications related to urinary retention Outcome: Progressing   Problem: Safety: Goal: Ability to remain free from injury will improve Outcome: Progressing

## 2023-11-12 NOTE — Progress Notes (Signed)
Vital signs reviewed, ICU needs resolved  Will sign off at this time. No further recommendations at this time.     Gusta Marksberry David Jakarius Flamenco, M.D.  [facility]  Medical Director ICU-[facility] Medical Director [facility] Cardio-Pulmonary Department   

## 2023-11-12 NOTE — Progress Notes (Signed)
PHARMACY - PHYSICIAN COMMUNICATION CRITICAL VALUE ALERT - BLOOD CULTURE IDENTIFICATION (BCID)  Results for orders placed or performed during the hospital encounter of 11/11/23  Resp panel by RT-PCR (RSV, Flu A&B, Covid) Anterior Nasal Swab     Status: None   Collection Time: 11/11/23  1:55 PM   Specimen: Anterior Nasal Swab  Result Value Ref Range Status   SARS Coronavirus 2 by RT PCR NEGATIVE NEGATIVE Final    Comment: (NOTE) SARS-CoV-2 target nucleic acids are NOT DETECTED.  The SARS-CoV-2 RNA is generally detectable in upper respiratory specimens during the acute phase of infection. The lowest concentration of SARS-CoV-2 viral copies this assay can detect is 138 copies/mL. A negative result does not preclude SARS-Cov-2 infection and should not be used as the sole basis for treatment or other patient management decisions. A negative result may occur with  improper specimen collection/handling, submission of specimen other than nasopharyngeal swab, presence of viral mutation(s) within the areas targeted by this assay, and inadequate number of viral copies(<138 copies/mL). A negative result must be combined with clinical observations, patient history, and epidemiological information. The expected result is Negative.  Fact Sheet for Patients:  BloggerCourse.com  Fact Sheet for Healthcare Providers:  SeriousBroker.it  This test is no t yet approved or cleared by the [place] FDA and  has been authorized for detection and/or diagnosis of SARS-CoV-2 by FDA under an Emergency Use Authorization (EUA). This EUA will remain  in effect (meaning this test can be used) for the duration of the COVID-19 declaration under Section 564(b)(1) of the Act, 21 U.S.C.section 360bbb-3(b)(1), unless the authorization is terminated  or revoked sooner.       Influenza A by PCR NEGATIVE NEGATIVE Final   Influenza B by PCR NEGATIVE NEGATIVE Final     Comment: (NOTE) The Xpert Xpress SARS-CoV-2/FLU/RSV plus assay is intended as an aid in the diagnosis of influenza from Nasopharyngeal swab specimens and should not be used as a sole basis for treatment. Nasal washings and aspirates are unacceptable for Xpert Xpress SARS-CoV-2/FLU/RSV testing.  Fact Sheet for Patients: BloggerCourse.com  Fact Sheet for Healthcare Providers: SeriousBroker.it  This test is not yet approved or cleared by the [place] FDA and has been authorized for detection and/or diagnosis of SARS-CoV-2 by FDA under an Emergency Use Authorization (EUA). This EUA will remain in effect (meaning this test can be used) for the duration of the COVID-19 declaration under Section 564(b)(1) of the Act, 21 U.S.C. section 360bbb-3(b)(1), unless the authorization is terminated or revoked.     Resp Syncytial Virus by PCR NEGATIVE NEGATIVE Final    Comment: (NOTE) Fact Sheet for Patients: BloggerCourse.com  Fact Sheet for Healthcare Providers: SeriousBroker.it  This test is not yet approved or cleared by the [place] FDA and has been authorized for detection and/or diagnosis of SARS-CoV-2 by FDA under an Emergency Use Authorization (EUA). This EUA will remain in effect (meaning this test can be used) for the duration of the COVID-19 declaration under Section 564(b)(1) of the Act, 21 U.S.C. section 360bbb-3(b)(1), unless the authorization is terminated or revoked.  Performed at [facility], [address] [place], [place] [address]   Blood Culture (routine x 2)     Status: None (Preliminary result)   Collection Time: 11/11/23  1:55 PM   Specimen: BLOOD  Result Value Ref Range Status   Specimen Description BLOOD BLOOD LEFT ARM  Final   Special Requests   Final    BOTTLES DRAWN AEROBIC AND  ANAEROBIC Blood Culture results may not be optimal due to  an inadequate volume of blood received in culture bottles   Culture  Setup Time   Final    GRAM POSITIVE COCCI AEROBIC BOTTLE ONLY Organism ID to follow CRITICAL RESULT CALLED TO, READ BACK BY AND VERIFIED WITH:  Dawayne Cirri AT 2334 11/12/23 JG Performed at [facility], [address], [place], [place] [address]    Culture GRAM POSITIVE COCCI  Final   Report Status PENDING  Incomplete  Blood Culture (routine x 2)     Status: None (Preliminary result)   Collection Time: 11/11/23  1:55 PM   Specimen: BLOOD  Result Value Ref Range Status   Specimen Description   Final    BLOOD BLOOD RIGHT ARM Performed at [facility], [address], [place], [place] [address]    Special Requests   Final    BOTTLES DRAWN AEROBIC AND ANAEROBIC Blood Culture adequate volume Performed at [facility], [address], [place], [place] [address]    Culture   Final    NO GROWTH 1 DAY Performed at [facility], [address], [place], [place] [address]    Report Status PENDING  Incomplete  Blood Culture ID Panel (Reflexed)     Status: Abnormal   Collection Time: 11/11/23  1:55 PM  Result Value Ref Range Status   Enterococcus faecalis NOT DETECTED NOT DETECTED Final   Enterococcus Faecium NOT DETECTED NOT DETECTED Final   Listeria monocytogenes NOT DETECTED NOT DETECTED Final   Staphylococcus species DETECTED (A) NOT DETECTED Final    Comment: CRITICAL RESULT CALLED TO, READ BACK BY AND VERIFIED WITH:  Evan Mackie AT 2334 11/12/23 JG    Staphylococcus aureus (BCID) NOT DETECTED NOT DETECTED Final   Staphylococcus epidermidis DETECTED (A) NOT DETECTED Final    Comment: Methicillin (oxacillin) resistant coagulase negative staphylococcus. Possible blood culture contaminant (unless isolated from more than one blood culture draw or clinical case suggests pathogenicity). No antibiotic treatment is indicated for blood  culture contaminants. CRITICAL RESULT CALLED TO,  READ BACK BY AND VERIFIED WITH:  Kimara Bencomo AT 2334 11/12/23 JG    Staphylococcus lugdunensis NOT DETECTED NOT DETECTED Final   Streptococcus species NOT DETECTED NOT DETECTED Final   Streptococcus agalactiae NOT DETECTED NOT DETECTED Final   Streptococcus pneumoniae NOT DETECTED NOT DETECTED Final   Streptococcus pyogenes NOT DETECTED NOT DETECTED Final   A.calcoaceticus-baumannii NOT DETECTED NOT DETECTED Final   Bacteroides fragilis NOT DETECTED NOT DETECTED Final   Enterobacterales NOT DETECTED NOT DETECTED Final   Enterobacter cloacae complex NOT DETECTED NOT DETECTED Final   Escherichia coli NOT DETECTED NOT DETECTED Final   Klebsiella aerogenes NOT DETECTED NOT DETECTED Final   Klebsiella oxytoca NOT DETECTED NOT DETECTED Final   Klebsiella pneumoniae NOT DETECTED NOT DETECTED Final   Proteus species NOT DETECTED NOT DETECTED Final   Salmonella species NOT DETECTED NOT DETECTED Final   Serratia marcescens NOT DETECTED NOT DETECTED Final   Haemophilus influenzae NOT DETECTED NOT DETECTED Final   Neisseria meningitidis NOT DETECTED NOT DETECTED Final   Pseudomonas aeruginosa NOT DETECTED NOT DETECTED Final   Stenotrophomonas maltophilia NOT DETECTED NOT DETECTED Final   Candida albicans NOT DETECTED NOT DETECTED Final   Candida auris NOT DETECTED NOT DETECTED Final   Candida glabrata NOT DETECTED NOT DETECTED Final   Candida krusei NOT DETECTED NOT DETECTED Final   Candida parapsilosis NOT DETECTED NOT DETECTED Final   Candida tropicalis  NOT DETECTED NOT DETECTED Final   Cryptococcus neoformans/gattii NOT DETECTED NOT DETECTED Final   Methicillin resistance mecA/C DETECTED (A) NOT DETECTED Final    Comment: CRITICAL RESULT CALLED TO, READ BACK BY AND VERIFIED WITHDawayne Cirri AT 2334 11/12/23 JG Performed at [facility], [address] [place], [place] [address]   MRSA Next Gen by PCR, Nasal     Status: Abnormal   Collection Time: 11/11/23  6:24 PM   Specimen:  Nasal Mucosa; Nasal Swab  Result Value Ref Range Status   MRSA by PCR Next Gen DETECTED (A) NOT DETECTED Final    Comment: CRITICAL RESULT CALLED TO, READ BACK BY AND VERIFIED WITH: MARSHELL RICHARDS RN ICU @ 2045 11/11/2023 L[facility] (NOTE) The GeneXpert MRSA Assay (FDA approved for NASAL specimens only), is one component of a comprehensive MRSA colonization surveillance program. It is not intended to diagnose MRSA infection nor to guide or monitor treatment for MRSA infections. Test performance is not FDA approved in patients less than 96 years old. Performed at [facility], [address] [place], [place] [address]     BCID Results: 1 of 4 bottles with Staph Epi, mecA/C detected.  Name of provider contacted: N/A  Changes to prescribed antibiotics required: Pt on comfort care measures only, no changes recommended at this time.  Otelia Sergeant, PharmD, [facility] 11/12/2023 11:39 PM

## 2023-11-12 NOTE — TOC Progression Note (Signed)
Transition of Care [facility]) - Progression Note    Patient Details  Name: RIDLEY PELOSI MRN: [id number] Date of Birth: January 08, 1951  [facility] CM/SW Contact  Garret Reddish, RN Phone Number: 11/12/2023, 4:45 PM  Clinical Narrative:    Chart reviewed. I have spoken with Dorathy Daft, Admissions Coordinator at [facility] and she informs me that patient is able to return to [facility] with Hospice Following.  Dorathy Daft reports that Discharge Summary will need to include Hospice orders in the Discharge summary.    I have spoken with patient's daughter [name].  I have informed her that [facility] has their own [facility].  Katie had no Hospice preference and would like to use [facility].    I have informed [name] that patient will be a tentative discharge for tomorrow.  TOC will continue to follow for discharge planning.          Expected Discharge Plan and Services                                               Social Determinants of Health (SDOH) Interventions SDOH Screenings   Depression (PHQ2-9): High Risk (04/26/2022)  Tobacco Use: Medium Risk (10/20/2023)    Readmission Risk Interventions     No data to display

## 2023-11-13 DIAGNOSIS — G40909 Epilepsy, unspecified, not intractable, without status epilepticus: Secondary | ICD-10-CM | POA: Diagnosis not present

## 2023-11-13 DIAGNOSIS — Z515 Encounter for palliative care: Secondary | ICD-10-CM | POA: Diagnosis not present

## 2023-11-13 DIAGNOSIS — R6520 Severe sepsis without septic shock: Secondary | ICD-10-CM

## 2023-11-13 DIAGNOSIS — J9601 Acute respiratory failure with hypoxia: Secondary | ICD-10-CM | POA: Diagnosis not present

## 2023-11-13 DIAGNOSIS — M791 Myalgia, unspecified site: Secondary | ICD-10-CM

## 2023-11-13 DIAGNOSIS — D496 Neoplasm of unspecified behavior of brain: Secondary | ICD-10-CM | POA: Diagnosis not present

## 2023-11-13 LAB — LAMOTRIGINE LEVEL: Lamotrigine Lvl: 3.8 ug/mL (ref 2.0–20.0)

## 2023-11-13 MED ORDER — MORPHINE SULFATE (CONCENTRATE) 20 MG/ML PO SOLN: 10.0000 mg | ORAL | 0 refills | Status: DC | PRN

## 2023-11-13 MED ORDER — LORAZEPAM 0.5 MG PO TABS: 0.5000 mg | ORAL_TABLET | ORAL | 0 refills | Status: AC | PRN

## 2023-11-13 MED ORDER — BACLOFEN 10 MG PO TABS: 10.0000 mg | ORAL_TABLET | Freq: Three times a day (TID) | ORAL | 0 refills | Status: AC

## 2023-11-13 NOTE — Discharge Summary (Signed)
Physician Discharge Summary   Patient: Patrick Buchanan MRN: [id number] DOB: 12/11/1950  Admit date:     11/11/2023  Discharge date: 11/13/23  Discharge Physician: Delfino Lovett   PCP: Mayer Masker, PA-C (Inactive)   Recommendations at discharge:    Hospice at [facility]  Discharge Diagnoses: Principal Problem:   Acute respiratory failure with hypoxia [facility]) Active Problems:   Sepsis (HCC)   Diabetes mellitus (HCC)   Hospice care   Brain tumor [facility])   Seizure disorder [facility])   Encephalopathy  Hospital Course: Assessment and Plan:  73 y.o. male with medical history significant of multiple brain tumor s/p resection, spastic hemiparesis of left nondominant side, type 2 diabetes, seizure disorder, encephalopathy presenting with sepsis, acute respiratory failure with hypoxia    1/20: Goals of care conversation with family.  Decision for full comfort care/hospice   * Acute respiratory failure with hypoxia (HCC) Sepsis (HCC) Meeting sepsis criteria with Tmax of 102.2, heart rate into the 100s   Diabetes mellitus (HCC) Acute metabolic Encephalopathy Seizure disorder (HCC) Brain tumor (HCC) History of multiple brain tumors Right brain aneurysm, status post right craniotomy in 1992 Noted History of parasagittal large size meningioma resection,  History of benign cervical subdural tumor resection in 2005   Family decided comfort care/Hospice.      Disposition: Hospice care Diet recommendation:  Discharge Diet Orders (From admission, onward)     Start     Ordered   11/13/23 0000  Diet - low sodium heart healthy        11/13/23 0821           Carb modified diet DISCHARGE MEDICATION: Allergies as of 11/13/2023   No Active Allergies      Medication List     STOP taking these medications    acetaminophen 500 MG tablet Commonly known as: TYLENOL   ALPRAZolam 0.5 MG tablet Commonly known as: XANAX   B Complex-Biotin-FA Tabs   Calmoseptine 0.44-20.6  % Oint Generic drug: Menthol-Zinc Oxide   Carboxymethylcellulose Sod PF 1 % Gel   esomeprazole 20 MG capsule Commonly known as: NEXIUM   gabapentin 100 MG capsule Commonly known as: NEURONTIN   gabapentin 400 MG capsule Commonly known as: NEURONTIN   haloperidol 5 MG tablet Commonly known as: HALDOL   haloperidol lactate 5 MG/ML injection Commonly known as: HALDOL   HYDROcodone-acetaminophen 5-325 MG tablet Commonly known as: NORCO/VICODIN   ketoconazole 2 % cream Commonly known as: NIZORAL   ketoconazole 2 % shampoo Commonly known as: NIZORAL   Magnesium 250 MG Tabs   magnesium hydroxide 400 MG/5ML suspension Commonly known as: MILK OF MAGNESIA   multivitamin tablet   pantoprazole 40 MG tablet Commonly known as: PROTONIX   Rexulti 0.5 MG Tabs Generic drug: Brexpiprazole   rosuvastatin 10 MG tablet Commonly known as: CRESTOR   senna-docusate 8.6-50 MG tablet Commonly known as: Senokot-S   tamsulosin 0.4 MG Caps capsule Commonly known as: FLOMAX       TAKE these medications    baclofen 10 MG tablet Commonly known as: LIORESAL Take 1 tablet (10 mg total) by mouth 3 (three) times daily for 3 days.   Biofreeze Cool The Pain 4 % Gel Generic drug: Menthol (Topical Analgesic) Apply 1 Application topically daily.   bisacodyl 10 MG suppository Commonly known as: DULCOLAX Place 1 suppository (10 mg total) rectally daily as needed for moderate constipation.   Enulose 10 GM/15ML Soln Generic drug: lactulose (encephalopathy) Take 10 g by mouth daily as  needed (Mild Constipation).   escitalopram 20 MG tablet Commonly known as: LEXAPRO Take 1 tablet (20 mg total) by mouth daily.   Lacosamide 100 MG Tabs Take 1 tablet (100 mg total) by mouth 2 (two) times daily.   lamoTRIgine 100 MG tablet Commonly known as: LaMICtal Take 1 tablet (100 mg total) by mouth 2 (two) times daily.   linaclotide 145 MCG Caps capsule Commonly known as: LINZESS Take 1  capsule (145 mcg total) by mouth daily before breakfast.   LORazepam 0.5 MG tablet Commonly known as: ATIVAN Take 1 tablet (0.5 mg total) by mouth every 4 (four) hours as needed for up to 3 days for anxiety. May crush, mix with water and give sublingually if needed. What changed:  when to take this reasons to take this additional instructions   morphine 20 MG/ML concentrated solution Commonly known as: ROXANOL Take 0.5 mLs (10 mg total) by mouth every 4 (four) hours as needed for severe pain (pain score 7-10), breakthrough pain, anxiety, moderate pain (pain score 4-6) or shortness of breath. May give sublingually if needed.   polyethylene glycol 17 g packet Commonly known as: MIRALAX / GLYCOLAX Take 17 g by mouth 2 (two) times daily.   polyvinyl alcohol 1.4 % ophthalmic solution Commonly known as: LIQUIFILM TEARS Place 1 drop into both eyes as needed for dry eyes.   QUEtiapine 50 MG tablet Commonly known as: SEROQUEL Take 1 tablet (50 mg total) by mouth at bedtime.   traZODone 150 MG tablet Commonly known as: DESYREL Take 150 mg by mouth at bedtime.               Discharge Care Instructions  (From admission, onward)           Start     Ordered   11/13/23 0000  Discharge wound care:       Comments: As above   11/13/23 0821            Discharge Exam: Filed Weights   11/11/23 1821  Weight: 62.5 kg   General exam: 73 year old cachectic looking male looks chronically ill Respiratory system: Rhonchi and rales at the bases bilaterally Cardiovascular system: S1 & S2 heard, RRR.  Tachycardic Gastrointestinal system: Abdomen is nondistended, soft and nontender. No organomegaly or masses felt. Normal bowel sounds heard. Central nervous system: sleepy  Confused, spastic hemiparesis of the left/nondominant side Extremities: Spastic hemiparesis of the left side Skin: No rashes, lesions or ulcers  Condition at discharge: poor  The results of significant  diagnostics from this hospitalization (including imaging, microbiology, ancillary and laboratory) are listed below for reference.   Imaging Studies: CT CHEST WO CONTRAST Result Date: 11/11/2023 CLINICAL DATA:  Advanced dementia presenting with shortness of breath, fever and sepsis. Evaluate for respiratory illness. EXAM: CT CHEST WITHOUT CONTRAST TECHNIQUE: Multidetector CT imaging of the chest was performed following the standard protocol without IV contrast. RADIATION DOSE REDUCTION: This exam was performed according to the departmental dose-optimization program which includes automated exposure control, adjustment of the mA and/or kV according to patient size and/or use of iterative reconstruction technique. COMPARISON:  None Available. FINDINGS: Cardiovascular: Heart size is within normal limits. Aortic atherosclerosis. Coronary artery calcifications. No pericardial effusion. Mediastinum/Nodes: Thyroid gland, trachea, and esophagus appear normal. No enlarged mediastinal, axillary or supraclavicular lymph nodes. Hilar lymph nodes are suboptimally evaluated due to lack of IV contrast material. Lungs/Pleura: Asymmetric elevation of the right hemidiaphragm. No pleural effusion. Diffuse bronchial wall thickening. Scattered tree-in-bud nodularity identified within both  lungs along with patchy areas of ground-glass attenuation. Interlobular thickening noted within the right middle lobe. A few scattered nonspecific lung nodules are noted bilaterally. This includes right upper lobe lung nodule measuring 4 mm, image 59/2. Right middle lobe lung nodule measures 5 mm, image 101/2. Dense airspace consolidation involving nearly the entire right lower lobe is identified with sparing of the superior segment. Some scattered air bronchograms identified within this area. Upper Abdomen: No acute abnormality. Multiple gallstones identified measuring up to at least 1.5 cm, image 163/2. Musculoskeletal: No chest wall mass or  suspicious bone lesions identified. IMPRESSION: 1. Dense airspace consolidation involving nearly the entire right lower lobe with sparing of the superior segment. Findings are compatible with pneumonia. This will require follow-up imaging to ensure complete resolution and to rule out underlying malignancy. 2. Scattered tree-in-bud nodularity identified within both lungs along with patchy areas of ground-glass attenuation. Findings are favored to represent sequelae of infectious bronchiolitis. 3. A few scattered nonspecific lung nodules are noted bilaterally. These measure up to 5 mm and although these are likely postinflammatory/infectious follow-up imaging is advised to ensure resolution. 4. Coronary artery calcifications. 5. Cholelithiasis. 6.  Aortic Atherosclerosis (ICD10-I70.0). Electronically Signed   By: Signa Kell M.D.   On: 11/11/2023 16:25   DG Chest Port 1 View Result Date: 11/11/2023 CLINICAL DATA:  Possible sepsis.  Respiratory distress. EXAM: PORTABLE CHEST 1 VIEW COMPARISON:  09/28/2023 FINDINGS: Patient is moderately rotated to the right. Mild prominence of the right perihilar vessels which may be partly positional in nature versus asymmetric vascular congestion. Left lung is clear. No lobar consolidation or effusion. Cardiomediastinal silhouette and remainder of the exam is unchanged. IMPRESSION: Mild prominence of the right perihilar vessels which may be partly positional in nature versus asymmetric vascular congestion. Electronically Signed   By: Elberta Fortis M.D.   On: 11/11/2023 14:34   DG Shoulder Left Port Result Date: 10/21/2023 CLINICAL DATA:  Shoulder pain.  Fall on left side. EXAM: LEFT SHOULDER COMPARISON:  Shoulder radiograph 09/28/2023 FINDINGS: No fracture or dislocation. Unchanged appearance of acromioclavicular and glenohumeral osteoarthritis. No erosive change. IMPRESSION: 1. No fracture or dislocation of the left shoulder. 2. Unchanged acromioclavicular and glenohumeral  osteoarthritis. Electronically Signed   By: Narda Rutherford M.D.   On: 10/21/2023 03:06    Microbiology: Results for orders placed or performed during the hospital encounter of 11/11/23  Resp panel by RT-PCR (RSV, Flu A&B, Covid) Anterior Nasal Swab     Status: None   Collection Time: 11/11/23  1:55 PM   Specimen: Anterior Nasal Swab  Result Value Ref Range Status   SARS Coronavirus 2 by RT PCR NEGATIVE NEGATIVE Final    Comment: (NOTE) SARS-CoV-2 target nucleic acids are NOT DETECTED.  The SARS-CoV-2 RNA is generally detectable in upper respiratory specimens during the acute phase of infection. The lowest concentration of SARS-CoV-2 viral copies this assay can detect is 138 copies/mL. A negative result does not preclude SARS-Cov-2 infection and should not be used as the sole basis for treatment or other patient management decisions. A negative result may occur with  improper specimen collection/handling, submission of specimen other than nasopharyngeal swab, presence of viral mutation(s) within the areas targeted by this assay, and inadequate number of viral copies(<138 copies/mL). A negative result must be combined with clinical observations, patient history, and epidemiological information. The expected result is Negative.  Fact Sheet for Patients:  BloggerCourse.com  Fact Sheet for Healthcare Providers:  SeriousBroker.it  This test is no t  yet approved or cleared by the [place] and  has been authorized for detection and/or diagnosis of SARS-CoV-2 by FDA under an Emergency Use Authorization (EUA). This EUA will remain  in effect (meaning this test can be used) for the duration of the COVID-19 declaration under Section 564(b)(1) of the Act, 21 U.S.C.section 360bbb-3(b)(1), unless the authorization is terminated  or revoked sooner.       Influenza A by PCR NEGATIVE NEGATIVE Final   Influenza B by PCR NEGATIVE  NEGATIVE Final    Comment: (NOTE) The Xpert Xpress SARS-CoV-2/FLU/RSV plus assay is intended as an aid in the diagnosis of influenza from Nasopharyngeal swab specimens and should not be used as a sole basis for treatment. Nasal washings and aspirates are unacceptable for Xpert Xpress SARS-CoV-2/FLU/RSV testing.  Fact Sheet for Patients: BloggerCourse.com  Fact Sheet for Healthcare Providers: SeriousBroker.it  This test is not yet approved or cleared by the [place] FDA and has been authorized for detection and/or diagnosis of SARS-CoV-2 by FDA under an Emergency Use Authorization (EUA). This EUA will remain in effect (meaning this test can be used) for the duration of the COVID-19 declaration under Section 564(b)(1) of the Act, 21 U.S.C. section 360bbb-3(b)(1), unless the authorization is terminated or revoked.     Resp Syncytial Virus by PCR NEGATIVE NEGATIVE Final    Comment: (NOTE) Fact Sheet for Patients: BloggerCourse.com  Fact Sheet for Healthcare Providers: SeriousBroker.it  This test is not yet approved or cleared by the [place] FDA and has been authorized for detection and/or diagnosis of SARS-CoV-2 by FDA under an Emergency Use Authorization (EUA). This EUA will remain in effect (meaning this test can be used) for the duration of the COVID-19 declaration under Section 564(b)(1) of the Act, 21 U.S.C. section 360bbb-3(b)(1), unless the authorization is terminated or revoked.  Performed at [facility], [address], [place], [place] [address]   Blood Culture (routine x 2)     Status: None (Preliminary result)   Collection Time: 11/11/23  1:55 PM   Specimen: BLOOD  Result Value Ref Range Status   Specimen Description   Final    BLOOD BLOOD LEFT ARM Performed at [facility], [address], [place], [place] [address]    Special  Requests   Final    BOTTLES DRAWN AEROBIC AND ANAEROBIC Blood Culture results may not be optimal due to an inadequate volume of blood received in culture bottles Performed at [facility], [address] [place], [place] [address]    Culture  Setup Time   Final    GRAM POSITIVE COCCI AEROBIC BOTTLE ONLY Organism ID to follow CRITICAL RESULT CALLED TO, READ BACK BY AND VERIFIED WITH:  NATHAN BELUE AT 2334 11/12/23 JG GRAM STAIN REVIEWED-AGREE WITH RESULT Performed at [facility], [address] [place], [place] [address]    Culture GRAM POSITIVE COCCI  Final   Report Status PENDING  Incomplete  Blood Culture (routine x 2)     Status: None (Preliminary result)   Collection Time: 11/11/23  1:55 PM   Specimen: BLOOD  Result Value Ref Range Status   Specimen Description BLOOD BLOOD RIGHT ARM  Final   Special Requests   Final    BOTTLES DRAWN AEROBIC AND ANAEROBIC Blood Culture adequate volume   Culture   Final    NO GROWTH 2 DAYS Performed at [facility], [address], [place], [place] [address]    Report Status PENDING  Incomplete  Blood Culture ID Panel (Reflexed)     Status: Abnormal   Collection Time: 11/11/23  1:55 PM  Result Value Ref Range Status   Enterococcus faecalis NOT DETECTED NOT DETECTED Final   Enterococcus Faecium NOT DETECTED NOT DETECTED Final   Listeria monocytogenes NOT DETECTED NOT DETECTED Final   Staphylococcus species DETECTED (A) NOT DETECTED Final    Comment: CRITICAL RESULT CALLED TO, READ BACK BY AND VERIFIED WITH:  NATHAN BELUE AT 2334 11/12/23 JG    Staphylococcus aureus (BCID) NOT DETECTED NOT DETECTED Final   Staphylococcus epidermidis DETECTED (A) NOT DETECTED Final    Comment: Methicillin (oxacillin) resistant coagulase negative staphylococcus. Possible blood culture contaminant (unless isolated from more than one blood culture draw or clinical case suggests pathogenicity). No antibiotic treatment is indicated for  blood  culture contaminants. CRITICAL RESULT CALLED TO, READ BACK BY AND VERIFIED WITH:  NATHAN BELUE AT 2334 11/12/23 JG    Staphylococcus lugdunensis NOT DETECTED NOT DETECTED Final   Streptococcus species NOT DETECTED NOT DETECTED Final   Streptococcus agalactiae NOT DETECTED NOT DETECTED Final   Streptococcus pneumoniae NOT DETECTED NOT DETECTED Final   Streptococcus pyogenes NOT DETECTED NOT DETECTED Final   A.calcoaceticus-baumannii NOT DETECTED NOT DETECTED Final   Bacteroides fragilis NOT DETECTED NOT DETECTED Final   Enterobacterales NOT DETECTED NOT DETECTED Final   Enterobacter cloacae complex NOT DETECTED NOT DETECTED Final   Escherichia coli NOT DETECTED NOT DETECTED Final   Klebsiella aerogenes NOT DETECTED NOT DETECTED Final   Klebsiella oxytoca NOT DETECTED NOT DETECTED Final   Klebsiella pneumoniae NOT DETECTED NOT DETECTED Final   Proteus species NOT DETECTED NOT DETECTED Final   Salmonella species NOT DETECTED NOT DETECTED Final   Serratia marcescens NOT DETECTED NOT DETECTED Final   Haemophilus influenzae NOT DETECTED NOT DETECTED Final   Neisseria meningitidis NOT DETECTED NOT DETECTED Final   Pseudomonas aeruginosa NOT DETECTED NOT DETECTED Final   Stenotrophomonas maltophilia NOT DETECTED NOT DETECTED Final   Candida albicans NOT DETECTED NOT DETECTED Final   Candida auris NOT DETECTED NOT DETECTED Final   Candida glabrata NOT DETECTED NOT DETECTED Final   Candida krusei NOT DETECTED NOT DETECTED Final   Candida parapsilosis NOT DETECTED NOT DETECTED Final   Candida tropicalis NOT DETECTED NOT DETECTED Final   Cryptococcus neoformans/gattii NOT DETECTED NOT DETECTED Final   Methicillin resistance mecA/C DETECTED (A) NOT DETECTED Final    Comment: CRITICAL RESULT CALLED TO, READ BACK BY AND VERIFIED WITHDawayne Cirri AT 2334 11/12/23 JG Performed at [facility], [address] [place], [place] [address]   MRSA Next Gen by PCR, Nasal     Status:  Abnormal   Collection Time: 11/11/23  6:24 PM   Specimen: Nasal Mucosa; Nasal Swab  Result Value Ref Range Status   MRSA by PCR Next Gen DETECTED (A) NOT DETECTED Final    Comment: CRITICAL RESULT CALLED TO, READ BACK BY AND VERIFIED WITH: MARSHELL RICHARDS RN ICU @ 2045 11/11/2023 LFD (NOTE) The GeneXpert MRSA Assay (FDA approved for NASAL specimens only), is one component of a comprehensive MRSA colonization surveillance program. It is not intended to diagnose MRSA infection nor to guide or monitor treatment for MRSA infections. Test performance is not FDA approved in patients less than 48 years old. Performed at [facility], [address] [place], [place] [address]     Labs: CBC: Recent Labs  Lab 11/11/23 1355 11/12/23 0234  WBC 9.6 7.5  NEUTROABS 8.7*  --  HGB 13.8 11.6*  HCT 42.9 35.4*  MCV 99.3 99.4  PLT 226 158   Basic Metabolic Panel: Recent Labs  Lab 11/11/23 1355 11/12/23 0234  NA 140 142  K 3.7 3.9  CL 101 108  CO2 27 27  GLUCOSE 97 109*  BUN 24* 26*  CREATININE 0.83 0.54*  CALCIUM 8.1* 7.7*   Liver Function Tests: Recent Labs  Lab 11/11/23 1355 11/12/23 0234  AST 30 23  ALT 22 20  ALKPHOS 111 76  BILITOT 1.0 0.7  PROT 5.8* 4.4*  ALBUMIN 2.9* 2.1*   CBG: Recent Labs  Lab 11/11/23 1821 11/11/23 1928 11/11/23 2329 11/12/23 0408 11/12/23 0730  GLUCAP 94 87 87 96 99    Discharge time spent: greater than 30 minutes.  Signed: Delfino Lovett, MD [facility] 11/13/2023

## 2023-11-13 NOTE — Plan of Care (Signed)
  Problem: Education: Goal: Ability to describe self-care measures that may prevent or decrease complications (Diabetes Survival Skills Education) will improve Outcome: Progressing Goal: Individualized Educational Video(s) Outcome: Progressing   Problem: Coping: Goal: Ability to adjust to condition or change in health will improve Outcome: Progressing   Problem: Fluid Volume: Goal: Ability to maintain a balanced intake and output will improve Outcome: Progressing   Problem: Health Behavior/Discharge Planning: Goal: Ability to identify and utilize available resources and services will improve Outcome: Progressing Goal: Ability to manage health-related needs will improve Outcome: Progressing   Problem: Metabolic: Goal: Ability to maintain appropriate glucose levels will improve Outcome: Progressing   Problem: Nutritional: Goal: Maintenance of adequate nutrition will improve Outcome: Progressing Goal: Progress toward achieving an optimal weight will improve Outcome: Progressing   Problem: Tissue Perfusion: Goal: Adequacy of tissue perfusion will improve Outcome: Progressing

## 2023-11-13 NOTE — TOC Transition Note (Signed)
Transition of Care [facility]) - Discharge Note   Patient Details  Name: Patrick Buchanan MRN: [id number] Date of Birth: 1951/03/13  Transition of [facility]) CM/SW Contact:  Allena Katz, LCSW Phone Number: 11/13/2023, 9:43 AM   Clinical Narrative: Pt [facility] with hospice. Dc summary sent to facility. RN given number for report. Kayla with LC notified. Pt going to room 211B. No additional needs at this time.      Final next level of care: Skilled Nursing Facility Barriers to Discharge: Barriers Resolved   Patient Goals and CMS Choice   CMS Medicare.gov Compare Post Acute Care list provided to:: Patient        Discharge Placement              Patient chooses bed at: [facility] Patient to be transferred to facility by: [facility] Name of family member notified: Katie Patient and family notified of of transfer: 11/13/23  Discharge Plan and Services Additional resources added to the After Visit Summary for                                       Social Drivers of Health (SDOH) Interventions SDOH Screenings   Food Insecurity: Patient Unable To Answer (11/13/2023)  Housing: Patient Unable To Answer (11/13/2023)  Transportation Needs: Patient Unable To Answer (11/13/2023)  Utilities: Patient Unable To Answer (11/13/2023)  Depression (PHQ2-9): High Risk (04/26/2022)  Social Connections: Patient Unable To Answer (11/13/2023)  Tobacco Use: Medium Risk (10/20/2023)     Readmission Risk Interventions     No data to display

## 2023-11-13 NOTE — Progress Notes (Signed)
                                                     Palliative Care Progress Note   Patient Name: Patrick Buchanan       Date: 11/13/2023 DOB: [birth year]  Age: 73 y.o. MRN#: [id number] Attending Physician: Delfino Lovett, MD Primary Care Physician: Mayer Masker, PA-C (Inactive) Admit Date: 11/11/2023  Chart reviewed.  Discharge summary in place.  Plan is set for patient to discharge with hospice services to follow.  No acute palliative needs at this time.  Thank you for allowing the Palliative Medicine Team to assist in the care of Patrick Buchanan.  Samara Deist L. Bonita Quin, DNP, FNP-BC Palliative Medicine Team  No charge

## 2023-11-14 ENCOUNTER — Other Ambulatory Visit: Payer: Self-pay

## 2023-11-14 ENCOUNTER — Encounter: Payer: Self-pay | Admitting: Internal Medicine

## 2023-11-14 ENCOUNTER — Inpatient Hospital Stay
Admission: EM | Admit: 2023-11-14 | Discharge: 2023-11-24 | DRG: 951 | Disposition: E | Payer: Medicare HMO | Source: Skilled Nursing Facility | Attending: Osteopathic Medicine | Admitting: Osteopathic Medicine

## 2023-11-14 DIAGNOSIS — L89156 Pressure-induced deep tissue damage of sacral region: Secondary | ICD-10-CM | POA: Diagnosis present

## 2023-11-14 DIAGNOSIS — I2510 Atherosclerotic heart disease of native coronary artery without angina pectoris: Secondary | ICD-10-CM | POA: Diagnosis present

## 2023-11-14 DIAGNOSIS — Z9049 Acquired absence of other specified parts of digestive tract: Secondary | ICD-10-CM | POA: Diagnosis not present

## 2023-11-14 DIAGNOSIS — E119 Type 2 diabetes mellitus without complications: Secondary | ICD-10-CM | POA: Diagnosis present

## 2023-11-14 DIAGNOSIS — R6521 Severe sepsis with septic shock: Secondary | ICD-10-CM | POA: Diagnosis present

## 2023-11-14 DIAGNOSIS — F32A Depression, unspecified: Secondary | ICD-10-CM | POA: Diagnosis present

## 2023-11-14 DIAGNOSIS — Z833 Family history of diabetes mellitus: Secondary | ICD-10-CM

## 2023-11-14 DIAGNOSIS — J9601 Acute respiratory failure with hypoxia: Secondary | ICD-10-CM | POA: Diagnosis present

## 2023-11-14 DIAGNOSIS — E785 Hyperlipidemia, unspecified: Secondary | ICD-10-CM | POA: Diagnosis present

## 2023-11-14 DIAGNOSIS — Z66 Do not resuscitate: Secondary | ICD-10-CM | POA: Diagnosis present

## 2023-11-14 DIAGNOSIS — Z79899 Other long term (current) drug therapy: Secondary | ICD-10-CM

## 2023-11-14 DIAGNOSIS — Z6841 Body Mass Index (BMI) 40.0 and over, adult: Secondary | ICD-10-CM

## 2023-11-14 DIAGNOSIS — R54 Age-related physical debility: Secondary | ICD-10-CM | POA: Diagnosis present

## 2023-11-14 DIAGNOSIS — J189 Pneumonia, unspecified organism: Secondary | ICD-10-CM | POA: Diagnosis present

## 2023-11-14 DIAGNOSIS — R231 Pallor: Secondary | ICD-10-CM | POA: Diagnosis not present

## 2023-11-14 DIAGNOSIS — Z515 Encounter for palliative care: Secondary | ICD-10-CM | POA: Diagnosis not present

## 2023-11-14 DIAGNOSIS — A419 Sepsis, unspecified organism: Secondary | ICD-10-CM | POA: Diagnosis present

## 2023-11-14 DIAGNOSIS — Z87891 Personal history of nicotine dependence: Secondary | ICD-10-CM

## 2023-11-14 DIAGNOSIS — G9341 Metabolic encephalopathy: Secondary | ICD-10-CM | POA: Diagnosis present

## 2023-11-14 DIAGNOSIS — R64 Cachexia: Secondary | ICD-10-CM | POA: Diagnosis present

## 2023-11-14 DIAGNOSIS — K219 Gastro-esophageal reflux disease without esophagitis: Secondary | ICD-10-CM | POA: Diagnosis present

## 2023-11-14 DIAGNOSIS — Z681 Body mass index (BMI) 19 or less, adult: Secondary | ICD-10-CM

## 2023-11-14 DIAGNOSIS — G40909 Epilepsy, unspecified, not intractable, without status epilepticus: Secondary | ICD-10-CM | POA: Diagnosis present

## 2023-11-14 DIAGNOSIS — J301 Allergic rhinitis due to pollen: Secondary | ICD-10-CM | POA: Diagnosis present

## 2023-11-14 LAB — CULTURE, BLOOD (ROUTINE X 2)

## 2023-11-14 MED ORDER — MORPHINE SULFATE (PF) 2 MG/ML IV SOLN: 1.0000 mg | INTRAVENOUS | Status: DC | PRN

## 2023-11-14 MED ORDER — LORAZEPAM 2 MG/ML IJ SOLN
1.0000 mg | Freq: Once | INTRAMUSCULAR | Status: AC
  Administered 2023-11-14: 1 mg via INTRAVENOUS
  Filled 2023-11-14: qty 1

## 2023-11-14 MED ORDER — MORPHINE SULFATE (PF) 4 MG/ML IV SOLN
4.0000 mg | Freq: Once | INTRAVENOUS | Status: AC
  Administered 2023-11-14: 4 mg via INTRAVENOUS
  Filled 2023-11-14: qty 1

## 2023-11-14 MED ORDER — HALOPERIDOL 0.5 MG PO TABS
0.5000 mg | ORAL_TABLET | ORAL | Status: DC | PRN
Start: 2023-11-14 — End: 2023-11-16
  Filled 2023-11-14: qty 1

## 2023-11-14 MED ORDER — LORAZEPAM 1 MG PO TABS: 1.0000 mg | ORAL_TABLET | ORAL | Status: DC | PRN

## 2023-11-14 MED ORDER — MORPHINE BOLUS VIA INFUSION
1.0000 mg | INTRAVENOUS | Status: DC | PRN
  Administered 2023-11-14 (×4): 4 mg via INTRAVENOUS

## 2023-11-14 MED ORDER — ACETAMINOPHEN 325 MG PO TABS
650.0000 mg | ORAL_TABLET | Freq: Four times a day (QID) | ORAL | Status: DC | PRN
Start: 2023-11-14 — End: 2023-11-16

## 2023-11-14 MED ORDER — LORAZEPAM 2 MG/ML IJ SOLN
1.0000 mg | INTRAMUSCULAR | Status: DC | PRN
Start: 2023-11-14 — End: 2023-11-15
  Administered 2023-11-14 – 2023-11-15 (×2): 1 mg via INTRAVENOUS
  Filled 2023-11-14 (×2): qty 1

## 2023-11-14 MED ORDER — MORPHINE BOLUS VIA INFUSION
1.0000 mg | INTRAVENOUS | Status: DC | PRN
  Administered 2023-11-16: 2 mg via INTRAVENOUS
  Administered 2023-11-16: 3 mg via INTRAVENOUS
  Administered 2023-11-16 (×2): 2 mg via INTRAVENOUS
  Administered 2023-11-16 – 2023-11-17 (×3): 4 mg via INTRAVENOUS
  Administered 2023-11-17 (×2): 3 mg via INTRAVENOUS
  Administered 2023-11-17: 2 mg via INTRAVENOUS

## 2023-11-14 MED ORDER — LORAZEPAM 2 MG/ML PO CONC
1.0000 mg | ORAL | Status: DC | PRN
  Filled 2023-11-14: qty 0.5

## 2023-11-14 MED ORDER — HALOPERIDOL LACTATE 5 MG/ML IJ SOLN
0.5000 mg | INTRAMUSCULAR | Status: DC | PRN
  Administered 2023-11-17: 0.5 mg via INTRAVENOUS
  Filled 2023-11-14 (×2): qty 1

## 2023-11-14 MED ORDER — ONDANSETRON HCL 4 MG/2ML IJ SOLN: 4.0000 mg | Freq: Four times a day (QID) | INTRAMUSCULAR | Status: DC | PRN

## 2023-11-14 MED ORDER — ONDANSETRON HCL 4 MG PO TABS: 4.0000 mg | ORAL_TABLET | Freq: Four times a day (QID) | ORAL | Status: DC | PRN

## 2023-11-14 MED ORDER — ACETAMINOPHEN 650 MG RE SUPP
650.0000 mg | Freq: Four times a day (QID) | RECTAL | Status: DC | PRN
  Administered 2023-11-16: 650 mg via RECTAL
  Filled 2023-11-14: qty 1

## 2023-11-14 MED ORDER — HALOPERIDOL LACTATE 2 MG/ML PO CONC
0.5000 mg | ORAL | Status: DC | PRN
  Filled 2023-11-14: qty 5

## 2023-11-14 MED ORDER — MORPHINE 100MG IN NS 100ML (1MG/ML) PREMIX INFUSION
1.0000 mg/h | INTRAVENOUS | Status: DC
  Administered 2023-11-14: 1 mg/h via INTRAVENOUS
  Administered 2023-11-14 – 2023-11-15 (×2): 2 mg/h via INTRAVENOUS
  Administered 2023-11-17: 3 mg/h via INTRAVENOUS
  Filled 2023-11-14 (×3): qty 100

## 2023-11-14 NOTE — ED Provider Notes (Addendum)
[facility] Provider Note    Event Date/Time   First MD Initiated Contact with Patient 11/14/23 [phone]     (approximate)   History   Respiratory Distress   HPI  Patrick Buchanan is a 73 y.o. male with history of cerebral aneurysm repair, brain tumor s/p resection with spastic hemiparesis of his left side, seizures, diabetes who presents to the emergency department from [facility] nursing home with hypoxia.  Patient was just discharged from the hospital yesterday for acute respiratory failure, sepsis secondary to pneumonia.  Family decided on full comfort care, hospice.  DNR was signed yesterday and they were sent back to [facility].  Daughter Patrick Buchanan reports that the hospice team was not able to see him at [facility] yesterday and therefore no orders were placed.  She states that the staff called her tonight when he became hypoxic and asked her what she wanted to do.  Daughter states she was concerned because they had no orders in place to manage him at [facility] so she recommended he come back to the hospital.  Sats on room air per EMS with 80%.  Patient crying out in pain, very agitated, pulling off his nonrebreather.   Patrick Buchanan notes that she would like to be the first contact.  Patient's other daughter is in [place].  History provided by EMS, patient's daughter Patrick Buchanan by phone.    Past Medical History:  Diagnosis Date   Allergy    hay fever   Blood transfusion without reported diagnosis    Cerebral aneurysm 1992   "leaking"; treated by Dr Newell Coral   Depression    Diabetes mellitus without complication (HCC)    type 2   GERD (gastroesophageal reflux disease)    past hx     Past Surgical History:  Procedure Laterality Date   APPENDECTOMY     CEREBRAL ANEURYSM REPAIR  1992   clipping by Dr Newell Coral; cannot have an MRI   CRANIOTOMY Bilateral 02/27/2022   Procedure: Craniotomy - bilateral - Parietal;  Surgeon: Julio Sicks, MD;   Location: [facility] OR;  Service: Neurosurgery;  Laterality: Bilateral;   HERNIA REPAIR N/A 1990   inguinal   INGUINAL HERNIA REPAIR     as a child and then another at 51 yrs old   TONSILLECTOMY     TUMOR EXCISION  2005   benign cervical    MEDICATIONS:  Prior to Admission medications   Medication Sig Start Date End Date Taking? Authorizing Provider  baclofen (LIORESAL) 10 MG tablet Take 1 tablet (10 mg total) by mouth 3 (three) times daily for 3 days. 11/13/23 11/16/23  Delfino Lovett, MD  bisacodyl (DULCOLAX) 10 MG suppository Place 1 suppository (10 mg total) rectally daily as needed for moderate constipation. 05/13/22   Leroy Sea, MD  ENULOSE 10 GM/15ML SOLN Take 10 g by mouth daily as needed (Mild Constipation). 09/14/23   [provider]  escitalopram (LEXAPRO) 20 MG tablet Take 1 tablet (20 mg total) by mouth daily. 04/24/22   Carlean Jews, NP  Lacosamide 100 MG TABS Take 1 tablet (100 mg total) by mouth 2 (two) times daily. 01/02/23   Levert Feinstein, MD  lamoTRIgine (LAMICTAL) 100 MG tablet Take 1 tablet (100 mg total) by mouth 2 (two) times daily. 01/02/23   Levert Feinstein, MD  linaclotide [facility]) 145 MCG CAPS capsule Take 1 capsule (145 mcg total) by mouth daily before breakfast. 04/24/22   Carlean Jews, NP  LORazepam (ATIVAN)  0.5 MG tablet Take 1 tablet (0.5 mg total) by mouth every 4 (four) hours as needed for up to 3 days for anxiety. May crush, mix with water and give sublingually if needed. 11/13/23 11/16/23  Delfino Lovett, MD  Menthol, Topical Analgesic, (BIOFREEZE COOL THE PAIN) 4 % GEL Apply 1 Application topically daily.    [provider]  morphine (ROXANOL) 20 MG/ML concentrated solution Take 0.5 mLs (10 mg total) by mouth every 4 (four) hours as needed for severe pain (pain score 7-10), breakthrough pain, anxiety, moderate pain (pain score 4-6) or shortness of breath. May give sublingually if needed. 11/13/23   Delfino Lovett, MD  polyethylene glycol (MIRALAX /  GLYCOLAX) 17 g packet Take 17 g by mouth 2 (two) times daily.    [provider]  polyvinyl alcohol (LIQUIFILM TEARS) 1.4 % ophthalmic solution Place 1 drop into both eyes as needed for dry eyes.    [provider]  QUEtiapine (SEROQUEL) 50 MG tablet Take 1 tablet (50 mg total) by mouth at bedtime. 05/13/22   Leroy Sea, MD  traZODone (DESYREL) 150 MG tablet Take 150 mg by mouth at bedtime. 10/31/23   [provider]    Physical Exam   Triage Vital Signs: ED Triage Vitals  Encounter Vitals Group     BP 11/14/23 0650 117/81     Systolic BP Percentile --      Diastolic BP Percentile --      Pulse Rate 11/14/23 0650 (!) 117     Resp 11/14/23 0650 (!) 28     Temp 11/14/23 0650 99.4 F (37.4 C)     Temp Source 11/14/23 0650 Axillary     SpO2 11/14/23 0650 96 %     Weight 11/14/23 0651 136 lb 11 oz (62 kg)     Height --      Head Circumference --      Peak Flow --      Pain Score --      Pain Loc --      Pain Education --      Exclude from Growth Chart --     Most recent vital signs: Vitals:   11/14/23 0650 11/14/23 0730  BP: 117/81 97/60  Pulse: (!) 117 (!) 112  Resp: (!) 28   Temp: 99.4 F (37.4 C)   SpO2: 96% 100%    CONSTITUTIONAL: Alert, agitated, moaning, in respiratory distress, cachectic HEAD: Normocephalic, atraumatic EYES: Conjunctivae clear, pupils appear equal, sclera nonicteric ENT: normal nose; dry mucous membranes NECK: Supple, normal ROM CARD: Regular and tachycardic; S1 and S2 appreciated RESP: Tachypneic, no hypoxia on nonrebreather, rhonchorous breath sounds bilaterally ABD/GI: Non-distended; soft EXT: Normal ROM in all joints; no deformity noted, no edema SKIN: Normal color for age and race; warm; no rash on exposed skin    ED Results / Procedures / Treatments   LABS: (all labs ordered are listed, but only abnormal results are displayed) Labs Reviewed - No data to display   EKG:   RADIOLOGY: My personal  review and interpretation of imaging:    I have personally reviewed all radiology reports.   No results found.   PROCEDURES:  Critical Care performed: No      Procedures    IMPRESSION / MDM / ASSESSMENT AND PLAN / ED COURSE  I reviewed the triage vital signs and the nursing notes.    Patient here with increasing agitation, discomfort and hypoxia.  Was just discharged home from the hospital to  [facility] on hospice care but family states that hospice was not able to see him or place any orders and therefore the facility called EMS secondary to decompensation.     DIFFERENTIAL DIAGNOSIS (includes but not limited to):   Sepsis, pneumonia, respiratory failure, pulmonary edema, pneumothorax   Patient's presentation is most consistent with acute presentation with potential threat to life or bodily function.   PLAN: Had lengthy discussion with patient's daughter Patrick Buchanan by phone who is her his power of attorney.  She confirms that she wants to keep patient DNR/DNI and wants to avoid IV antibiotics, IV fluids at this time.  She agrees with making him comfortable.  Will give morphine, Ativan.  Will keep him on a nonrebreather for hypoxia.  She would like for him to be discharged back to [facility] but only if they have orders in place to manage him there.  She is very worried about him going back there and them not taking care of him appropriately.  She states that she would be interested in social work evaluation to determine if another hospice facility would be available for patient today.  Will place social work consult and palliative care consult.  She states if this is not able to be achieved this morning then she would like patient to be admitted to the hospital for comfort care.   MEDICATIONS GIVEN IN ED: Medications  morphine (PF) 4 MG/ML injection 4 mg (has no administration in time range)  LORazepam (ATIVAN) injection 1 mg (has no administration in time range)   morphine (PF) 4 MG/ML injection 4 mg (4 mg Intravenous Given 11/14/23 0659)  LORazepam (ATIVAN) injection 1 mg (1 mg Intravenous Given 11/14/23 0659)     ED COURSE: Patient appears more comfortable after morphine, Ativan.  Currently resting comfortably.  Sitter at bedside.   7:54 AM  Pt becoming more agitated.  More pain and anxiety medications ordered to keep him comfortable.  CONSULTS:  Consulted and discussed patient's case with hospitalist, Dr. Chipper Herb.  I have recommended admission and consulting physician agrees and will place admission orders.  Patient (and family if present) agree with this plan.   I reviewed all nursing notes, vitals, pertinent previous records.  All labs, EKGs, imaging ordered have been independently reviewed and interpreted by myself.    OUTSIDE RECORDS REVIEWED: Reviewed patient's recent admission.       FINAL CLINICAL IMPRESSION(S) / ED DIAGNOSES   Final diagnoses:  Acute respiratory failure with hypoxia (HCC)     Rx / DC Orders   ED Discharge Orders     None        Note:  This document was prepared using Dragon voice recognition software and may include unintentional dictation errors.   Simrat Kendrick, Layla Maw, DO 11/14/23 0748    Billijo Dilling, Layla Maw, DO 11/14/23 [phone]

## 2023-11-14 NOTE — ED Triage Notes (Signed)
Pt from [facility]. Per ems pt is supposed to have comfort care only with hospice, but hospice has not seen pt yet. Per ems pt with oxygen sats in 80s at facility and facility md stated to call ems for hospital evaluation. Pt confused, arrives on nrb, but continues to remove mask, no meaninful speech noted.

## 2023-11-14 NOTE — ED Notes (Signed)
Mit placed on patients right hand due to pulling non rebreather off face

## 2023-11-14 NOTE — Progress Notes (Signed)
   11/14/23 1500  Spiritual Encounters  Type of Visit Initial  Care provided to: Pt and family  Referral source Nurse (RN/NT/LPN)  Reason for visit Routine spiritual support  Interventions  Spiritual Care Interventions Made Reflective listening  Intervention Outcomes  Outcomes Awareness of support  Spiritual Care Plan  Spiritual Care Issues Still Outstanding No further spiritual care needs at this time (see row info)

## 2023-11-14 NOTE — ED Notes (Signed)
Report to amber, rn.  

## 2023-11-14 NOTE — ED Notes (Signed)
Non re breather placed back on patient after NP noticed patient to be gasping for air

## 2023-11-14 NOTE — ED Notes (Signed)
Sitter at bedside.

## 2023-11-14 NOTE — Consult Note (Signed)
Consultation Note Date: 11/14/2023 at 1030  Patient Name: Patrick Buchanan  DOB: 1951/07/15  MRN: [id number]  Age / Sex: 73 y.o., male  PCP: Mayer Masker, PA-C (Inactive) Referring Physician: Emeline General, MD  HPI/Patient Profile: 73 y.o. male  with past medical history of multiple brain tumors s/p resection, spastic hemiparesis of left nondominant side, type 2 diabetes, seizure disorder, and encephalopathy admitted on 11/14/2023 with worsening hypoxia.  Patient was discharged yesterday, 1/21, to [facility] with hospice services to follow.  However, as per chart review, patient presented to [facility] without hospice services (provided by [facility] and not an outside agency) in place.  Given hypoxia and no options for comfort care, patient was transferred back to [facility].  PMT was consulted to support patient with comfort measures and ensure plan of care is in line with his goals of care.  Clinical Assessment and Goals of Care: Extensive chart review completed prior to meeting patient including labs, vital signs, imaging, progress notes, orders, and available advanced directive documents from current and previous encounters. I then met with patient at bedside during multiple visits throughout the day- twice in the ED and once on unit 1C.  He is unable to participate in goals of care medical decision making independently at this time.  Counseled with RNs at bedside in ED.  Discussed comfort measures, morphine gtt., bolus from infusion, reasons to give bolus and to increase basal rate, and supplemental oxygen.    Reviewed that patient is full comfort measures.  Nonrebreather is not in line with comfort measures.  Just after a bolus of 4 mg of morphine, I attempted to remove nonrebreather and place nasal cannula for comfort.  Patient began to moan, groan, and move his head back-and-forth.   Non-rebreather replaced for comfort.  Advised RN to leave in place until further goals of care discussions are had with family.   After assessing the patient, it is my recommendation that patient not be transferred out of the hospital.  He is appears to be actively dying. His respirations are even, unlabored, but very shallow.  He has drooping of the nasolabial folds, hyperextension of his neck, no response to verbal or physical stimuli, nonreactive pupils, eyelids that do not stay closed, and intermittent grunting and moaning - all signs of imminent death.  I anticipate in-hospital death.  After secure chatting with attending, TOC, ED RN Amber, and hospice liaison, I spoke with patient's daughters over the phone.  Expressed my concerns that patient is in a delicate state, at end-of-life and is actively dying.  Shared my concern that patient may pass in transport if they decide to move him to hospice home.    I attempted to elicit values and goals important to patient and family.  Family shares their priority is to make sure patient is free from suffering and in as minimal pain and discomfort as possible.  They were in agreement that they would like for patient to remain in hospital for now avoid him potentially passing  while transferring to hospice home..  Discussed above with attending DR. Zhang who was in agreement for patient to remain in hospital for full comfort care measures.  Patient's daughter Patrick Buchanan is attempting to fly from [place].  Discussed that nonrebreather is likely keeping patient from passing at this point.  Both daughter shared they would like rebreather to stay on so that Patrick Buchanan can be given the chance to be with him before he passes.  During afternoon rounds, I met with patient after he was transferred out of ED onto MedSurg floor.  Family, including daughter Patrick Buchanan and Patrick Buchanan, and pastor are at bedside.  Discussed full comfort measures. We discussed comfort measures. Mr. Mumma  will no longer receive aggressive medical interventions such as continuous vital signs, lab work, radiology testing, or medications not focused on comfort. All care would focus on how he is looking and feeling. This includes management of any symptoms that may cause discomfort, pain, shortness of breath, cough, nausea, agitation, anxiety, and/or secretions etc. Symptoms would be managed with medications and other non-pharmacological interventions such as spiritual support if requested, repositioning, music therapy, or therapeutic listening.   Family verbalized understanding and appreciation.   Therapeutic silence, active listening, and emotional support provided.  Space and opportunity provided for family to ask questions and voiced concerns regarding patient's current medical situation.  I again highlighted that nonrebreather is not in line with allowing the natural disease process to take place.  Also shared that he is not suffering and appears to be in no distress at this time.  Family was in agreement for nonrebreather to stay in place in hopes that patient's daughter Patrick Buchanan can arrive prior to him passing.  We discussed slowly weaning patient from nonrebreather to either nasal cannula or room air once family has been able to be bedside and say their goodbyes.  Full comfort measures to continue with nonrebreather to remain in place until family is able to visit this evening. Once family has been able to say their goodbyes AND they are ready to transition to full comfort measures, weaning should occur with appropriate medications for air hunger/SOB/terminal agitation. I discussed this POC in person with patient's daughters Patrick Buchanan and Engineer, manufacturing at bedside as well as with charge RN Debi and dayshift RN Sangita who confirmed understanding and are in agreement with this modified comfort care plan.   Attending, TOC, RN, and hospice liaison all aware that transfer to hospice home has been paused for today and  that patient will remain in hospital for full comfort measures at this time. We will re-evaluate tomorrow.   Primary Decision Maker NEXT OF KIN  Physical Exam Vitals reviewed.  Constitutional:      General: He is not in acute distress.    Appearance: He is ill-appearing.  HENT:     Head:     Comments: Thin, frail temporal wasting    Nose: Nose normal.     Mouth/Throat:     Mouth: Mucous membranes are dry.  Pulmonary:     Comments: Shallow respirations, unlabored Skin:    General: Skin is warm and dry.  Neurological:     Comments: Non verbal, non responsive to physical or verbal stimuli     Palliative Assessment/Data: 10%     Thank you for this consult. Palliative medicine will continue to follow and assist holistically.   Time Total: 150 minutes  Time spent includes: Detailed review of medical records (labs, imaging, vital signs), medically appropriate exam (mental status, respiratory, cardiac, skin),  discussed with treatment team, counseling and educating patient, family and staff, documenting clinical information, medication management and coordination of care.  Signed by: Georgiann Cocker, DNP, FNP-BC Palliative Medicine   Please contact Palliative Medicine Team providers via [facility] for questions and concerns.

## 2023-11-14 NOTE — ED Notes (Signed)
Patient moaning out. Labored breathing. Appears uncomfortable

## 2023-11-14 NOTE — Plan of Care (Addendum)
Patient is unresponsive. He is getting a morphine at the rate of 2 mg/hrs as order.no any sign of respiratory distress seen.he is getting a oxygen through non rebreathe.  Problem: Education: Goal: Knowledge of the prescribed therapeutic regimen will improve 11/14/2023 1730 by Leandro Reasoner, RN Outcome: Progressing 11/14/2023 1730 by Leandro Reasoner, RN Outcome: Progressing   Problem: Coping: Goal: Ability to identify and develop effective coping behavior will improve Outcome: Progressing   Problem: Clinical Measurements: Goal: Quality of life will improve Outcome: Progressing   Problem: Respiratory: Goal: Verbalizations of increased ease of respirations will increase Outcome: Progressing   Problem: Role Relationship: Goal: Family's ability to cope with current situation will improve Outcome: Progressing Goal: Ability to verbalize concerns, feelings, and thoughts to partner or family member will improve Outcome: Progressing   Problem: Pain Management: Goal: Satisfaction with pain management regimen will improve Outcome: Progressing   Problem: Education: Goal: Knowledge of General Education information will improve Description: Including pain rating scale, medication(s)/side effects and non-pharmacologic comfort measures Outcome: Progressing   Problem: Health Behavior/Discharge Planning: Goal: Ability to manage health-related needs will improve Outcome: Progressing   Problem: Clinical Measurements: Goal: Ability to maintain clinical measurements within normal limits will improve Outcome: Progressing Goal: Will remain free from infection Outcome: Progressing Goal: Diagnostic test results will improve Outcome: Progressing Goal: Respiratory complications will improve Outcome: Progressing Goal: Cardiovascular complication will be avoided Outcome: Progressing   Problem: Activity: Goal: Risk for activity intolerance will decrease Outcome: Progressing   Problem:  Nutrition: Goal: Adequate nutrition will be maintained Outcome: Progressing   Problem: Coping: Goal: Level of anxiety will decrease Outcome: Progressing   Problem: Elimination: Goal: Will not experience complications related to bowel motility Outcome: Progressing Goal: Will not experience complications related to urinary retention Outcome: Progressing   Problem: Pain Managment: Goal: General experience of comfort will improve and/or be controlled Outcome: Progressing   Problem: Safety: Goal: Ability to remain free from injury will improve Outcome: Progressing   Problem: Skin Integrity: Goal: Risk for impaired skin integrity will decrease Outcome: Progressing

## 2023-11-14 NOTE — TOC Progression Note (Signed)
Transition of Care [facility]) - Progression Note    Patient Details  Name: Patrick Buchanan MRN: [id number] Date of Birth: 22-Jan-1951  Transition of [facility]) CM/SW Contact  Tory Emerald, [place] Phone Number: 11/14/2023, 10:06 AM  Clinical Narrative:     CSW completed chart review and reached out to pt's daughter, Patrick Buchanan. Patrick Buchanan then conference her sister, Patrick Buchanan, into the call. CSW and daughter's discussed cost benefit analysis of hospice house vs. Returning to [facility] w/ hospice. Daughters would prefer hospice house over [facility]. CSW messaged Ree Kida with Physicist, medical and asked him to review pt for hospice house. Pt's daughter, Patrick Buchanan, is going to contact [facility] to see when they would be able to take him for hospice, if [facility] is not an option.        Expected Discharge Plan and Services                                               Social Determinants of Health (SDOH) Interventions SDOH Screenings   Food Insecurity: Patient Unable To Answer (11/14/2023)  Housing: Patient Unable To Answer (11/14/2023)  Transportation Needs: Patient Unable To Answer (11/14/2023)  Utilities: Patient Unable To Answer (11/14/2023)  Depression (PHQ2-9): High Risk (04/26/2022)  Social Connections: Patient Unable To Answer (11/14/2023)  Tobacco Use: Medium Risk (11/14/2023)    Readmission Risk Interventions     No data to display

## 2023-11-14 NOTE — H&P (Signed)
History and Physical    Patrick Buchanan WEX:[id number] DOB: [birth year] DOA: 11/14/2023  PCP: Mayer Masker, PA-C (Inactive) (Confirm with patient/family/NH records and if not entered, this has to be entered at [facility] point of entry) Patient coming from: SNF  I have personally briefly reviewed patient's old medical records in [facility]  Chief Complaint: Worsening of hypoxia  HPI: Patrick Buchanan is a 73 y.o. male with medical history significant of multiple 0.2 MRSA status post resection, spastic hemiparesis on left nondominant side, IIDM, seizure disorder, sent from nursing home for evaluation of worsening of hypoxia.  Patient was just recently hospitalized for acute hypoxic respiratory failure and treated for 8 right lower lobe pneumonia last week.  After discussion with family regarding prognosis, patient was discharged yesterday to nursing home to initiate full scope comfort care.  Overnight, it was found the patient became profoundly hypoxic with O2 saturation in the 80% despite on maximum oxygen support and unfortunately nursing home has no available palliative care service, eventually patient was sent back to hospital this morning.  ED Course: Patient was placed on NRB, and started on morphine and Ativan as needed  Review of Systems: Unable to perform, patient is in severe respiratory distress Past Medical History:  Diagnosis Date   Allergy    hay fever   Blood transfusion without reported diagnosis    Cerebral aneurysm 1992   "leaking"; treated by Dr Newell Coral   Depression    Diabetes mellitus without complication (HCC)    type 2   GERD (gastroesophageal reflux disease)    past hx     Past Surgical History:  Procedure Laterality Date   APPENDECTOMY     CEREBRAL ANEURYSM REPAIR  1992   clipping by Dr Newell Coral; cannot have an MRI   CRANIOTOMY Bilateral 02/27/2022   Procedure: Craniotomy - bilateral - Parietal;  Surgeon: Julio Sicks, MD;  Location: [facility] OR;   Service: Neurosurgery;  Laterality: Bilateral;   HERNIA REPAIR N/A 1990   inguinal   INGUINAL HERNIA REPAIR     as a child and then another at 68 yrs old   TONSILLECTOMY     TUMOR EXCISION  2005   benign cervical     reports that he quit smoking about 20 years ago. His smoking use included cigarettes. He started smoking about 30 years ago. He has a 10 pack-year smoking history. He has never used smokeless tobacco. He reports that he does not currently use alcohol. He reports that he does not currently use drugs.  No Known Allergies  Family History  Problem Relation Age of Onset   Breast cancer Mother    Diabetes Brother    Colon cancer Neg Hx    Colon polyps Neg Hx    Esophageal cancer Neg Hx    Rectal cancer Neg Hx    Stomach cancer Neg Hx      Prior to Admission medications   Medication Sig Start Date End Date Taking? Authorizing Provider  baclofen (LIORESAL) 10 MG tablet Take 1 tablet (10 mg total) by mouth 3 (three) times daily for 3 days. 11/13/23 11/16/23  Delfino Lovett, MD  bisacodyl (DULCOLAX) 10 MG suppository Place 1 suppository (10 mg total) rectally daily as needed for moderate constipation. 05/13/22   Leroy Sea, MD  ENULOSE 10 GM/15ML SOLN Take 10 g by mouth daily as needed (Mild Constipation). 09/14/23   [provider]  escitalopram (LEXAPRO) 20 MG tablet Take 1 tablet (20 mg total) by mouth  daily. 04/24/22   Carlean Jews, NP  Lacosamide 100 MG TABS Take 1 tablet (100 mg total) by mouth 2 (two) times daily. 01/02/23   Levert Feinstein, MD  lamoTRIgine (LAMICTAL) 100 MG tablet Take 1 tablet (100 mg total) by mouth 2 (two) times daily. 01/02/23   Levert Feinstein, MD  linaclotide [facility]) 145 MCG CAPS capsule Take 1 capsule (145 mcg total) by mouth daily before breakfast. 04/24/22   Carlean Jews, NP  LORazepam (ATIVAN) 0.5 MG tablet Take 1 tablet (0.5 mg total) by mouth every 4 (four) hours as needed for up to 3 days for anxiety. May crush, mix with water and give  sublingually if needed. 11/13/23 11/16/23  Delfino Lovett, MD  Menthol, Topical Analgesic, (BIOFREEZE COOL THE PAIN) 4 % GEL Apply 1 Application topically daily.    [provider]  morphine (ROXANOL) 20 MG/ML concentrated solution Take 0.5 mLs (10 mg total) by mouth every 4 (four) hours as needed for severe pain (pain score 7-10), breakthrough pain, anxiety, moderate pain (pain score 4-6) or shortness of breath. May give sublingually if needed. 11/13/23   Delfino Lovett, MD  polyethylene glycol (MIRALAX / GLYCOLAX) 17 g packet Take 17 g by mouth 2 (two) times daily.    [provider]  polyvinyl alcohol (LIQUIFILM TEARS) 1.4 % ophthalmic solution Place 1 drop into both eyes as needed for dry eyes.    [provider]  QUEtiapine (SEROQUEL) 50 MG tablet Take 1 tablet (50 mg total) by mouth at bedtime. 05/13/22   Leroy Sea, MD  traZODone (DESYREL) 150 MG tablet Take 150 mg by mouth at bedtime. 10/31/23   [provider]    Physical Exam: Vitals:   11/14/23 0650 11/14/23 0651 11/14/23 0730 11/14/23 0857  BP: 117/81  97/60   Pulse: (!) 117  (!) 112   Resp: (!) 28     Temp: 99.4 F (37.4 C)     TempSrc: Axillary     SpO2: 96%  100%   Weight:  62 kg    Height:    6' (1.829 m)    Constitutional: NAD, calm, comfortable Vitals:   11/14/23 0650 11/14/23 0651 11/14/23 0730 11/14/23 0857  BP: 117/81  97/60   Pulse: (!) 117  (!) 112   Resp: (!) 28     Temp: 99.4 F (37.4 C)     TempSrc: Axillary     SpO2: 96%  100%   Weight:  62 kg    Height:    6' (1.829 m)   Eyes: PERRL, lids and conjunctivae normal ENMT: Mucous membranes are moist. Posterior pharynx clear of any exudate or lesions.Normal dentition.  Neck: normal, supple, no masses, no thyromegaly Respiratory: clear to auscultation bilaterally, no wheezing, diffuse crackles bilaterally right more than left, increasing respiratory effort. No accessory muscle use.  Cardiovascular: Regular rate and rhythm, no  murmurs / rubs / gallops. No extremity edema. 2+ pedal pulses. No carotid bruits.  Abdomen: no tenderness, no masses palpated. No hepatosplenomegaly. Bowel sounds positive.  Musculoskeletal: no clubbing / cyanosis. No joint deformity upper and lower extremities. Good ROM, no contractures. Normal muscle tone.  Skin: no rashes, lesions, ulcers. No induration Neurologic: No facial droops, moving all limbs, not following commands Psychiatric: Obtunded, responded to painful stimuli,    Labs on Admission: I have personally reviewed following labs and imaging studies  CBC: Recent Labs  Lab 11/11/23 1355 11/12/23 0234  WBC 9.6 7.5  NEUTROABS 8.7*  --  HGB 13.8 11.6*  HCT 42.9 35.4*  MCV 99.3 99.4  PLT 226 158   Basic Metabolic Panel: Recent Labs  Lab 11/11/23 1355 11/12/23 0234  NA 140 142  K 3.7 3.9  CL 101 108  CO2 27 27  GLUCOSE 97 109*  BUN 24* 26*  CREATININE 0.83 0.54*  CALCIUM 8.1* 7.7*   GFR: Estimated Creatinine Clearance: 75.3 mL/min (A) (by C-G formula based on SCr of 0.54 mg/dL (L)). Liver Function Tests: Recent Labs  Lab 11/11/23 1355 11/12/23 0234  AST 30 23  ALT 22 20  ALKPHOS 111 76  BILITOT 1.0 0.7  PROT 5.8* 4.4*  ALBUMIN 2.9* 2.1*   No results for input(s): "LIPASE", "AMYLASE" in the last 168 hours. Recent Labs  Lab 11/11/23 1704  AMMONIA 11   Coagulation Profile: Recent Labs  Lab 11/11/23 1355  INR 1.2   Cardiac Enzymes: No results for input(s): "CKTOTAL", "CKMB", "CKMBINDEX", "TROPONINI" in the last 168 hours. BNP (last 3 results) No results for input(s): "PROBNP" in the last 8760 hours. HbA1C: Recent Labs    11/11/23 1704  HGBA1C 4.5*   CBG: Recent Labs  Lab 11/11/23 1821 11/11/23 1928 11/11/23 2329 11/12/23 0408 11/12/23 0730  GLUCAP 94 87 87 96 99   Lipid Profile: No results for input(s): "CHOL", "HDL", "LDLCALC", "TRIG", "CHOLHDL", "LDLDIRECT" in the last 72 hours. Thyroid Function Tests: No results for input(s):  "TSH", "T4TOTAL", "FREET4", "T3FREE", "THYROIDAB" in the last 72 hours. Anemia Panel: No results for input(s): "VITAMINB12", "FOLATE", "FERRITIN", "TIBC", "IRON", "RETICCTPCT" in the last 72 hours. Urine analysis:    Component Value Date/Time   COLORURINE YELLOW (A) 09/15/2023 0443   APPEARANCEUR CLOUDY (A) 09/15/2023 0443   LABSPEC 1.020 09/15/2023 0443   PHURINE 8.0 09/15/2023 0443   GLUCOSEU NEGATIVE 09/15/2023 0443   HGBUR NEGATIVE 09/15/2023 0443   BILIRUBINUR NEGATIVE 09/15/2023 0443   KETONESUR 20 (A) 09/15/2023 0443   PROTEINUR NEGATIVE 09/15/2023 0443   NITRITE NEGATIVE 09/15/2023 0443   LEUKOCYTESUR NEGATIVE 09/15/2023 0443    Radiological Exams on Admission: No results found.  EKG: Sinus tachycardia, no acute ST changes.  Assessment/Plan Principal Problem:   PNA (pneumonia) Active Problems:   Acute respiratory failure with hypoxia (HCC)  (please populate well all problems here in Problem List. (For example, if patient is on BP meds at home and you resume or decide to hold them, it is a problem that needs to be her. Same for CAD, COPD, HLD and so on)  Acute hypoxic respiratory failure -Secondary to right lower lobe pneumonia -ED physician discussed with patient's family, who confirmed that patient is DNR/DNI and pursuing comfort care only. -Will initiate morphine drip and Ativan as needed -Discussed with palliative care team. -Given his worsening mentation, will discontinue all p.o. medications to prevent aspiration -Expect patient will expire in next few days.  Acute metabolic encephalopathy -As above  DVT prophylaxis: None Code Status: DNR/DNI Family Communication: ED physician discussed case with patient's family over the phone Disposition Plan: Patient sick with terminal illness of profound hypoxia and right lower lobe pneumonia requiring inpatient hospice care, expect more than 2 midnight hospital stay Consults called: Palliative care Admission status:  MedSurg admission   Emeline General MD [facility] Pager [phone] 11/14/2023, 9:50 AM

## 2023-11-14 NOTE — ED Notes (Signed)
Patient cleaned and new brief placed. Pulled up in bed. Blankets applied on patient for warmth

## 2023-11-14 NOTE — Progress Notes (Signed)
[facility] Liaison Note  Received request from Transitions of Care Manger for family interest [facility].  Eligibility has been confirmed.  Spoke with patient's daughter to confirm interest and explain services.  Family agreeable to transfer today.  Transitions of Care manager aware.  RN please call report to [phone]  [facility]) prior to patient leaving the unit.  Please send signed and completed DNR with patient at discharge.  Thank you  Redge Gainer,  [facility] Liaison [phone]

## 2023-11-14 NOTE — ED Notes (Signed)
Patient moaning and groaning. Appears uncomfortable. Moving right hand to face. See MAR for meds given

## 2023-11-15 DIAGNOSIS — J9601 Acute respiratory failure with hypoxia: Secondary | ICD-10-CM | POA: Diagnosis not present

## 2023-11-15 DIAGNOSIS — Z515 Encounter for palliative care: Principal | ICD-10-CM

## 2023-11-15 DIAGNOSIS — J189 Pneumonia, unspecified organism: Secondary | ICD-10-CM | POA: Diagnosis not present

## 2023-11-15 MED ORDER — LORAZEPAM 2 MG/ML PO CONC: 1.0000 mg | ORAL | Status: DC | PRN

## 2023-11-15 MED ORDER — LORAZEPAM 2 MG/ML IJ SOLN
1.0000 mg | INTRAMUSCULAR | Status: DC | PRN
Start: 2023-11-15 — End: 2023-11-18
  Administered 2023-11-15 – 2023-11-16 (×4): 1 mg via INTRAVENOUS
  Filled 2023-11-15 (×4): qty 1

## 2023-11-15 MED ORDER — LORAZEPAM 1 MG PO TABS: 1.0000 mg | ORAL_TABLET | ORAL | Status: DC | PRN

## 2023-11-15 NOTE — Progress Notes (Signed)
Palliative Care Progress Note, Assessment & Plan   Patient Name: Patrick Buchanan       Date: 11/15/2023 DOB: 24-Sep-1951  Age: 73 y.o. MRN#: [id number] Attending Physician: Sunnie Nielsen, DO Primary Care Physician: Mayer Masker, PA-C (Inactive) Admit Date: 11/14/2023  Subjective: Patient is lying in bed, leaning to his right side.  He is not alert or able to acknowledge my presence.  His respirations are even and mildly labored as well as shallow.  His daughters are at bedside during my visit.  HPI: 73 y.o. male  with past medical history of multiple brain tumors s/p resection, spastic hemiparesis of left nondominant side, type 2 diabetes, seizure disorder, and encephalopathy admitted on 11/14/2023 with worsening hypoxia.   Patient was discharged yesterday, 1/21, to [facility] with hospice services to follow.  However, as per chart review, patient presented to [facility] without hospice services (provided by [facility] and not an outside agency) in place.  Given hypoxia and no options for comfort care, patient was transferred back to [facility].   PMT was consulted to support patient with comfort measures and ensure plan of care is in line with his goals of care.  Summary of counseling/coordination of care: Extensive chart review completed prior to meeting patient including labs, vital signs, imaging, progress notes, orders, and available advanced directive documents from current and previous encounters.   After reviewing the patient's chart and assessing the patient at bedside, I spoke with patient and his family at bedside in regards to symptom management and goals of care.   Symptoms assessed.  Patient has periods of apnea followed by large heaving breaths.  I believe patient is  experiencing air hunger.  Counseled family on air hunger and medications given to combat it.  I spoke with RN who administered 1 dose of Ativan during my visit.  Additionally, I bolus patient with 1 mg of morphine.  Education provided on morphine to aid in patient's experience of air hunger and shortness of breath.  Patient appeared much more comfortable after these as needed medications were given.  Additionally, I provided gentle massage to patient's head and arms and applied cool washcloth to his forehead for comfort.  Discussion of symptoms and signs to look for as patient approaches end-of-life, such as cooling and mottling of extremities, hyperextension of the neck, drooping of the nasolabial folds, and grunting/moaning.  Reviewed as needed medications are available to address patient's symptoms and again will not hasten his death.  Space and opportunity provided for family to share their thoughts and emotions.  Family asked how long I thought patient had to live.  I shared that barring any other sentinel event that patient likely has hours to days.  Discussed importance of speaking with patient as we know hearing is one of the last senses to go as patient's make their transitions to end-of-life.  Reviewed that while patient has some signs of imminent death he is remaining stable with current regimen.   I discussed that supplemental oxygen is likely not comfortable or aiding patient and feeling air hunger.  Given nasal cannula is only at 2 L and patient is mouth breathing, I recommended we remove nasal cannula.  Family  was in agreement.  Nasal cannula removed and no signs of distress noted.  Patient to remain on room air at this time.  Patient's family was appreciative of PMT support.  During my assessment, I found patient was wet with urine.  RN notified and patient to be changed.  Questions and concerns were addressed.  I continue to recommend that patient remain in the hospital as I anticipate  an in-hospital death.  Full comfort measures continue.  PMT will continue to follow and support patient throughout this hospitalization and transition.  Physical Exam Vitals reviewed.  Constitutional:      General: He is not in acute distress.    Appearance: He is ill-appearing.  HENT:     Head: Normocephalic.     Mouth/Throat:     Mouth: Mucous membranes are moist.  Cardiovascular:     Pulses: Normal pulses.  Abdominal:     Palpations: Abdomen is soft.  Musculoskeletal:     Comments: Generalized weakness  Skin:    General: Skin is warm.     Coloration: Skin is pale.  Psychiatric:        Behavior: Behavior normal.             Total Time 50 minutes   Time spent includes: Detailed review of medical records (labs, imaging, vital signs), medically appropriate exam (mental status, respiratory, cardiac, skin), discussed with treatment team, counseling and educating patient, family and staff, documenting clinical information, medication management and coordination of care.  Samara Deist L. Bonita Quin, DNP, FNP-BC Palliative Medicine Team

## 2023-11-15 NOTE — Plan of Care (Signed)
  Problem: Pain Management: Goal: Satisfaction with pain management regimen will improve Outcome: Progressing   

## 2023-11-15 NOTE — Progress Notes (Signed)
[facility] Liaison Note  Will  follow patient peripherally at this time.  Patient approved for the Hospice Home yesterday, but MD and Palliative Care did not believe patient would survive transport.  Hospital death expected.    Please don't hesitate to call with any Hospice related questions or concerns.    Thank you for the opportunity to participate in this patient's care.  [facility] Liaison [phone]

## 2023-11-15 NOTE — Hospital Course (Addendum)
Hospital course / significant events:  Patrick Buchanan is a 73 y.o. 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  Pt is on EOL/hospice care, facility not able to manage symptoms / services not in place. 01/22: pt admitted to hospitalist service on comfort measures. Palliative following.       Consultants:  Palliative care  Procedures/Surgeries: none      ASSESSMENT & PLAN:   Acute hypoxic respiratory failure Secondary to right lower lobe pneumonia DNR/DNI and pursuing comfort care only. morphine drip and Ativan as needed discontinue all p.o. medications to prevent aspiration Expect patient will expire in next few days. Family has requested turning/repositioning only as absolutely necessary, and no suppository medications. OK to continue to assess q2h/prn and adjust meds as ordered, change soiled brief, clean patient as needed.    Acute metabolic encephalopathy As above   Borderline underweight based on BMI: Body mass index is 18.54 kg/m.  Underweight - under 18  overweight - 25 to 29 obese - 30 or more Class 1 obesity: BMI of 30.0 to 34 Class 2 obesity: BMI of 35.0 to 39 Class 3 obesity: BMI of 40.0 to 49 Super Morbid Obesity: BMI 50-59 Super-super Morbid Obesity: BMI 60+ Significantly low or high BMI is associated with higher medical risk.  Weight management advised as adjunct to other disease management and risk reduction treatments    DVT prophylaxis: n/a on comfort measures  IV fluids: no continuous IV fluids  Nutrition: as tolerated / as desired  Central lines / invasive devices: none  Code Status: DNR/DNI ACP documentation reviewed:  advanced directive on file in [facility]  TOC needs: none at this time Barriers to dispo / significant pending items: comfort measures, anticipate  hospital death

## 2023-11-15 NOTE — Progress Notes (Signed)
PROGRESS NOTE    Patrick Buchanan   QIO:[id number] DOB: [birth year]  DOA: 11/14/2023 Date of Service: 11/15/23 which is hospital day 1  PCP: Mayer Masker, PA-C (Inactive)    Hospital course / significant events:  Patrick Buchanan is a 73 y.o. 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  Pt is on EOL/hospice care, facility not able to manage symptoms / services not in place. 01/22: pt admitted to hospitalist service on comfort measures. Palliative following.       Consultants:  Palliative care  Procedures/Surgeries: none      ASSESSMENT & PLAN:   Acute hypoxic respiratory failure Secondary to right lower lobe pneumonia DNR/DNI and pursuing comfort care only. morphine drip and Ativan as needed discontinue all p.o. medications to prevent aspiration Expect patient will expire in next few days.   Acute metabolic encephalopathy As above    Borderline underweight based on BMI: Body mass index is 18.54 kg/m.  Underweight - under 18  overweight - 25 to 29 obese - 30 or more Class 1 obesity: BMI of 30.0 to 34 Class 2 obesity: BMI of 35.0 to 39 Class 3 obesity: BMI of 40.0 to 49 Super Morbid Obesity: BMI 50-59 Super-super Morbid Obesity: BMI 60+ Significantly low or high BMI is associated with higher medical risk.  Weight management advised as adjunct to other disease management and risk reduction treatments    DVT prophylaxis: n/a on comfort measures  IV fluids: no continuous IV fluids  Nutrition: as tolerated / as desired  Central lines / invasive devices: none  Code Status: DNR/DNI ACP documentation reviewed:  advanced directive on file in [facility]  TOC needs: none at this time Barriers to dispo / significant pending items: comfort measures, anticipate hospital death               Subjective / Brief ROS:  Patient not contributory d/t illness   Family Communication: family at bedside, all questions answered, no needs currently, no concerns for patient currently     Objective Findings:  Vitals:   11/14/23 1200 11/14/23 1323 11/14/23 2107 11/15/23 0854  BP:  (!) 84/55 93/66 100/62  Pulse: 94 94 97 97  Resp:   (!) 3 (!) 8  Temp:  98 F (36.7 C) 98.6 F (37 C) 99.5 F (37.5 C)  TempSrc:  Oral Oral   SpO2: 96% (!) 79% 97% 91%  Weight:      Height:       No intake or output data in the 24 hours ending 11/15/23 1436 Filed Weights   11/14/23 0651  Weight: 62 kg    Examination:  Physical Exam Constitutional:      Appearance: He is ill-appearing and diaphoretic.  Cardiovascular:     Rate and Rhythm: Normal rate.  Pulmonary:     Effort: No respiratory distress.     Comments: Low RR but not quite agonal          Scheduled Medications:    Continuous Infusions:  morphine 2 mg/hr (11/14/23 1155)    PRN Medications:  acetaminophen **OR** acetaminophen, haloperidol **OR** haloperidol **OR** haloperidol lactate, LORazepam **OR** LORazepam **OR** LORazepam, morphine, ondansetron **OR** ondansetron (ZOFRAN) IV  Antimicrobials from admission:  Anti-infectives (From admission, onward)    None           Data Reviewed:  I have personally reviewed the  following...  CBC: Recent Labs  Lab 11/11/23 1355 11/12/23 0234  WBC 9.6 7.5  NEUTROABS 8.7*  --   HGB 13.8 11.6*  HCT 42.9 35.4*  MCV 99.3 99.4  PLT 226 158   Basic Metabolic Panel: Recent Labs  Lab 11/11/23 1355 11/12/23 0234  NA 140 142  K 3.7 3.9  CL 101 108  CO2 27 27  GLUCOSE 97 109*  BUN 24* 26*  CREATININE 0.83 0.54*  CALCIUM 8.1* 7.7*   GFR: Estimated Creatinine Clearance: 75.3 mL/min (A) (by C-G formula based on SCr of 0.54 mg/dL (L)). Liver Function Tests: Recent Labs  Lab 11/11/23 1355 11/12/23 0234  AST 30 23  ALT 22 20   ALKPHOS 111 76  BILITOT 1.0 0.7  PROT 5.8* 4.4*  ALBUMIN 2.9* 2.1*   No results for input(s): "LIPASE", "AMYLASE" in the last 168 hours. Recent Labs  Lab 11/11/23 1704  AMMONIA 11   Coagulation Profile: Recent Labs  Lab 11/11/23 1355  INR 1.2   Cardiac Enzymes: No results for input(s): "CKTOTAL", "CKMB", "CKMBINDEX", "TROPONINI" in the last 168 hours. BNP (last 3 results) No results for input(s): "PROBNP" in the last 8760 hours. HbA1C: No results for input(s): "HGBA1C" in the last 72 hours. CBG: Recent Labs  Lab 11/11/23 1821 11/11/23 1928 11/11/23 2329 11/12/23 0408 11/12/23 0730  GLUCAP 94 87 87 96 99   Lipid Profile: No results for input(s): "CHOL", "HDL", "LDLCALC", "TRIG", "CHOLHDL", "LDLDIRECT" in the last 72 hours. Thyroid Function Tests: No results for input(s): "TSH", "T4TOTAL", "FREET4", "T3FREE", "THYROIDAB" in the last 72 hours. Anemia Panel: No results for input(s): "VITAMINB12", "FOLATE", "FERRITIN", "TIBC", "IRON", "RETICCTPCT" in the last 72 hours. Most Recent Urinalysis On File:     Component Value Date/Time   COLORURINE YELLOW (A) 09/15/2023 0443   APPEARANCEUR CLOUDY (A) 09/15/2023 0443   LABSPEC 1.020 09/15/2023 0443   PHURINE 8.0 09/15/2023 0443   GLUCOSEU NEGATIVE 09/15/2023 0443   HGBUR NEGATIVE 09/15/2023 0443   BILIRUBINUR NEGATIVE 09/15/2023 0443   KETONESUR 20 (A) 09/15/2023 0443   PROTEINUR NEGATIVE 09/15/2023 0443   NITRITE NEGATIVE 09/15/2023 0443   LEUKOCYTESUR NEGATIVE 09/15/2023 0443   Sepsis Labs: @LABRCNTIP (procalcitonin:4,lacticidven:4) Microbiology: Recent Results (from the past 240 hours)  Resp panel by RT-PCR (RSV, Flu A&B, Covid) Anterior Nasal Swab     Status: None   Collection Time: 11/11/23  1:55 PM   Specimen: Anterior Nasal Swab  Result Value Ref Range Status   SARS Coronavirus 2 by RT PCR NEGATIVE NEGATIVE Final    Comment: (NOTE) SARS-CoV-2 target nucleic acids are NOT DETECTED.  The SARS-CoV-2 RNA is  generally detectable in upper respiratory specimens during the acute phase of infection. The lowest concentration of SARS-CoV-2 viral copies this assay can detect is 138 copies/mL. A negative result does not preclude SARS-Cov-2 infection and should not be used as the sole basis for treatment or other patient management decisions. A negative result may occur with  improper specimen collection/handling, submission of specimen other than nasopharyngeal swab, presence of viral mutation(s) within the areas targeted by this assay, and inadequate number of viral copies(<138 copies/mL). A negative result must be combined with clinical observations, patient history, and epidemiological information. The expected result is Negative.  Fact Sheet for Patients:  BloggerCourse.com  Fact Sheet for Healthcare Providers:  SeriousBroker.it  This test is no t yet approved or cleared by the [place] FDA and  has been authorized for detection and/or diagnosis of SARS-CoV-2 by FDA under an Emergency  Use Authorization (EUA). This EUA will remain  in effect (meaning this test can be used) for the duration of the COVID-19 declaration under Section 564(b)(1) of the Act, 21 U.S.C.section 360bbb-3(b)(1), unless the authorization is terminated  or revoked sooner.       Influenza A by PCR NEGATIVE NEGATIVE Final   Influenza B by PCR NEGATIVE NEGATIVE Final    Comment: (NOTE) The Xpert Xpress SARS-CoV-2/FLU/RSV plus assay is intended as an aid in the diagnosis of influenza from Nasopharyngeal swab specimens and should not be used as a sole basis for treatment. Nasal washings and aspirates are unacceptable for Xpert Xpress SARS-CoV-2/FLU/RSV testing.  Fact Sheet for Patients: BloggerCourse.com  Fact Sheet for Healthcare Providers: SeriousBroker.it  This test is not yet approved or cleared by the [place] FDA and has been authorized for detection and/or diagnosis of SARS-CoV-2 by FDA under an Emergency Use Authorization (EUA). This EUA will remain in effect (meaning this test can be used) for the duration of the COVID-19 declaration under Section 564(b)(1) of the Act, 21 U.S.C. section 360bbb-3(b)(1), unless the authorization is terminated or revoked.     Resp Syncytial Virus by PCR NEGATIVE NEGATIVE Final    Comment: (NOTE) Fact Sheet for Patients: BloggerCourse.com  Fact Sheet for Healthcare Providers: SeriousBroker.it  This test is not yet approved or cleared by the [place] FDA and has been authorized for detection and/or diagnosis of SARS-CoV-2 by FDA under an Emergency Use Authorization (EUA). This EUA will remain in effect (meaning this test can be used) for the duration of the COVID-19 declaration under Section 564(b)(1) of the Act, 21 U.S.C. section 360bbb-3(b)(1), unless the authorization is terminated or revoked.  Performed at [facility], [address], [place], [place] [address]   Blood Culture (routine x 2)     Status: Abnormal   Collection Time: 11/11/23  1:55 PM   Specimen: BLOOD  Result Value Ref Range Status   Specimen Description   Final    BLOOD BLOOD LEFT ARM Performed at [facility], [address] [place], [place] [address]    Special Requests   Final    BOTTLES DRAWN AEROBIC AND ANAEROBIC Blood Culture results may not be optimal due to an inadequate volume of blood received in culture bottles Performed at [facility], [address], [place], [place] [address]    Culture  Setup Time   Final    GRAM POSITIVE COCCI AEROBIC BOTTLE ONLY Organism ID to follow CRITICAL RESULT CALLED TO, READ BACK BY AND VERIFIED WITH:  NATHAN BELUE AT 2334 11/12/23 JG GRAM STAIN REVIEWED-AGREE WITH RESULT    Culture (A)  Final    STAPHYLOCOCCUS EPIDERMIDIS THE SIGNIFICANCE  OF ISOLATING THIS ORGANISM FROM A SINGLE SET OF BLOOD CULTURES WHEN MULTIPLE SETS ARE DRAWN IS UNCERTAIN. PLEASE NOTIFY THE MICROBIOLOGY DEPARTMENT WITHIN ONE WEEK IF SPECIATION AND SENSITIVITIES ARE REQUIRED. Performed at [facility], [address], [place], [place] [address]    Report Status 11/14/2023 FINAL  Final  Blood Culture (routine x 2)     Status: None (Preliminary result)   Collection Time: 11/11/23  1:55 PM   Specimen: BLOOD  Result Value Ref Range Status   Specimen Description BLOOD BLOOD RIGHT ARM  Final   Special Requests   Final    BOTTLES DRAWN AEROBIC AND ANAEROBIC Blood Culture adequate volume   Culture   Final    NO GROWTH 4 DAYS Performed at [facility], [address] [place], [place] [address]    Report Status PENDING  Incomplete  Blood Culture ID Panel (Reflexed)     Status: Abnormal   Collection Time: 11/11/23  1:55 PM  Result Value Ref Range Status   Enterococcus faecalis NOT DETECTED NOT DETECTED Final   Enterococcus Faecium NOT DETECTED NOT DETECTED Final   Listeria monocytogenes NOT DETECTED NOT DETECTED Final   Staphylococcus species DETECTED (A) NOT DETECTED Final    Comment: CRITICAL RESULT CALLED TO, READ BACK BY AND VERIFIED WITH:  NATHAN BELUE AT 2334 11/12/23 JG    Staphylococcus aureus (BCID) NOT DETECTED NOT DETECTED Final   Staphylococcus epidermidis DETECTED (A) NOT DETECTED Final    Comment: Methicillin (oxacillin) resistant coagulase negative staphylococcus. Possible blood culture contaminant (unless isolated from more than one blood culture draw or clinical case suggests pathogenicity). No antibiotic treatment is indicated for blood  culture contaminants. CRITICAL RESULT CALLED TO, READ BACK BY AND VERIFIED WITH:  NATHAN BELUE AT 2334 11/12/23 JG    Staphylococcus lugdunensis NOT DETECTED NOT DETECTED Final   Streptococcus species NOT DETECTED NOT DETECTED Final   Streptococcus agalactiae NOT DETECTED NOT DETECTED Final    Streptococcus pneumoniae NOT DETECTED NOT DETECTED Final   Streptococcus pyogenes NOT DETECTED NOT DETECTED Final   A.calcoaceticus-baumannii NOT DETECTED NOT DETECTED Final   Bacteroides fragilis NOT DETECTED NOT DETECTED Final   Enterobacterales NOT DETECTED NOT DETECTED Final   Enterobacter cloacae complex NOT DETECTED NOT DETECTED Final   Escherichia coli NOT DETECTED NOT DETECTED Final   Klebsiella aerogenes NOT DETECTED NOT DETECTED Final   Klebsiella oxytoca NOT DETECTED NOT DETECTED Final   Klebsiella pneumoniae NOT DETECTED NOT DETECTED Final   Proteus species NOT DETECTED NOT DETECTED Final   Salmonella species NOT DETECTED NOT DETECTED Final   Serratia marcescens NOT DETECTED NOT DETECTED Final   Haemophilus influenzae NOT DETECTED NOT DETECTED Final   Neisseria meningitidis NOT DETECTED NOT DETECTED Final   Pseudomonas aeruginosa NOT DETECTED NOT DETECTED Final   Stenotrophomonas maltophilia NOT DETECTED NOT DETECTED Final   Candida albicans NOT DETECTED NOT DETECTED Final   Candida auris NOT DETECTED NOT DETECTED Final   Candida glabrata NOT DETECTED NOT DETECTED Final   Candida krusei NOT DETECTED NOT DETECTED Final   Candida parapsilosis NOT DETECTED NOT DETECTED Final   Candida tropicalis NOT DETECTED NOT DETECTED Final   Cryptococcus neoformans/gattii NOT DETECTED NOT DETECTED Final   Methicillin resistance mecA/C DETECTED (A) NOT DETECTED Final    Comment: CRITICAL RESULT CALLED TO, READ BACK BY AND VERIFIED WITHDawayne Cirri AT 2334 11/12/23 JG Performed at [facility], [address] [place], [place] [address]   MRSA Next Gen by PCR, Nasal     Status: Abnormal   Collection Time: 11/11/23  6:24 PM   Specimen: Nasal Mucosa; Nasal Swab  Result Value Ref Range Status   MRSA by PCR Next Gen DETECTED (A) NOT DETECTED Final    Comment: CRITICAL RESULT CALLED TO, READ BACK BY AND VERIFIED WITH: MARSHELL RICHARDS RN ICU @ 2045 11/11/2023 LFD (NOTE) The  GeneXpert MRSA Assay (FDA approved for NASAL specimens only), is one component of a comprehensive MRSA colonization surveillance program. It is not intended to diagnose MRSA infection nor to guide or monitor treatment for MRSA infections. Test performance is not FDA approved in patients less than 56 years old. Performed at [facility], [address], [place], [place] [address]       Radiology Studies last  3 days: CT CHEST WO CONTRAST Result Date: 11/11/2023 CLINICAL DATA:  Advanced dementia presenting with shortness of breath, fever and sepsis. Evaluate for respiratory illness. EXAM: CT CHEST WITHOUT CONTRAST TECHNIQUE: Multidetector CT imaging of the chest was performed following the standard protocol without IV contrast. RADIATION DOSE REDUCTION: This exam was performed according to the departmental dose-optimization program which includes automated exposure control, adjustment of the mA and/or kV according to patient size and/or use of iterative reconstruction technique. COMPARISON:  None Available. FINDINGS: Cardiovascular: Heart size is within normal limits. Aortic atherosclerosis. Coronary artery calcifications. No pericardial effusion. Mediastinum/Nodes: Thyroid gland, trachea, and esophagus appear normal. No enlarged mediastinal, axillary or supraclavicular lymph nodes. Hilar lymph nodes are suboptimally evaluated due to lack of IV contrast material. Lungs/Pleura: Asymmetric elevation of the right hemidiaphragm. No pleural effusion. Diffuse bronchial wall thickening. Scattered tree-in-bud nodularity identified within both lungs along with patchy areas of ground-glass attenuation. Interlobular thickening noted within the right middle lobe. A few scattered nonspecific lung nodules are noted bilaterally. This includes right upper lobe lung nodule measuring 4 mm, image 59/2. Right middle lobe lung nodule measures 5 mm, image 101/2. Dense airspace consolidation involving nearly the entire  right lower lobe is identified with sparing of the superior segment. Some scattered air bronchograms identified within this area. Upper Abdomen: No acute abnormality. Multiple gallstones identified measuring up to at least 1.5 cm, image 163/2. Musculoskeletal: No chest wall mass or suspicious bone lesions identified. IMPRESSION: 1. Dense airspace consolidation involving nearly the entire right lower lobe with sparing of the superior segment. Findings are compatible with pneumonia. This will require follow-up imaging to ensure complete resolution and to rule out underlying malignancy. 2. Scattered tree-in-bud nodularity identified within both lungs along with patchy areas of ground-glass attenuation. Findings are favored to represent sequelae of infectious bronchiolitis. 3. A few scattered nonspecific lung nodules are noted bilaterally. These measure up to 5 mm and although these are likely postinflammatory/infectious follow-up imaging is advised to ensure resolution. 4. Coronary artery calcifications. 5. Cholelithiasis. 6.  Aortic Atherosclerosis (ICD10-I70.0). Electronically Signed   By: Signa Kell M.D.   On: 11/11/2023 16:25         Sunnie Nielsen, DO [facility] 11/15/2023, 2:36 PM    Dictation software may have been used to generate the above note. Typos may occur and escape review in typed/dictated notes. Please contact Dr Lyn Hollingshead directly for clarity if needed.  Staff may message me via secure chat in Epic  but this may not receive an immediate response,  please page me for urgent matters!  If 7PM-7AM, please contact night coverage [email or web address]

## 2023-11-16 DIAGNOSIS — J189 Pneumonia, unspecified organism: Secondary | ICD-10-CM | POA: Diagnosis not present

## 2023-11-16 DIAGNOSIS — J9601 Acute respiratory failure with hypoxia: Secondary | ICD-10-CM

## 2023-11-16 DIAGNOSIS — Z515 Encounter for palliative care: Secondary | ICD-10-CM | POA: Diagnosis not present

## 2023-11-16 LAB — CULTURE, BLOOD (ROUTINE X 2)
Culture: NO GROWTH
Special Requests: ADEQUATE

## 2023-11-16 MED ORDER — GLYCOPYRROLATE 0.2 MG/ML IJ SOLN
0.2000 mg | Freq: Four times a day (QID) | INTRAMUSCULAR | Status: DC | PRN
  Administered 2023-11-16 – 2023-11-17 (×2): 0.2 mg via INTRAVENOUS
  Filled 2023-11-16 (×2): qty 1

## 2023-11-16 NOTE — Plan of Care (Signed)
  Problem: Education: Goal: Knowledge of the prescribed therapeutic regimen will improve Outcome: Progressing   Problem: Role Relationship: Goal: Family's ability to cope with current situation will improve Outcome: Progressing Goal: Ability to verbalize concerns, feelings, and thoughts to partner or family member will improve Outcome: Progressing   Problem: Pain Management: Goal: Satisfaction with pain management regimen will improve Outcome: Progressing   Problem: Clinical Measurements: Goal: Quality of life will improve Outcome: Not Progressing   Problem: Respiratory: Goal: Verbalizations of increased ease of respirations will increase Outcome: Not Progressing

## 2023-11-16 NOTE — Progress Notes (Signed)
   11/16/23 0945  Spiritual Encounters  Type of Visit Initial  Care provided to: Pt and family  Conversation partners present during encounter Nurse (NP present)  Reason for visit End-of-life  OnCall Visit No  Interventions  Spiritual Care Interventions Made Established relationship of care and support;Compassionate presence;Reflective listening;Prayer  Intervention Outcomes  Outcomes Connection to spiritual care;Awareness of support

## 2023-11-16 NOTE — Progress Notes (Signed)
PROGRESS NOTE    Patrick Buchanan   ZOX:[id number] DOB: Feb 23, 1951  DOA: 11/14/2023 Date of Service: 11/16/23 which is hospital day 2  PCP: Mayer Masker, PA-C (Inactive)    Hospital course / significant events:  ANTAR MILKS is a 73 y.o. 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  Pt is on EOL/hospice care, facility not able to manage symptoms / services not in place. 01/22: pt admitted to hospitalist service on comfort measures. Palliative following.       Consultants:  Palliative care  Procedures/Surgeries: none      ASSESSMENT & PLAN:   Acute hypoxic respiratory failure Secondary to right lower lobe pneumonia DNR/DNI and pursuing comfort care only. morphine drip and Ativan as needed discontinue all p.o. medications to prevent aspiration Expect patient will expire in next few days. Family has requested turning/repositioning only as absolutely necessary, and no suppository medications. OK to continue to assess q2h/prn and adjust meds as ordered, change soiled brief, clean patient as needed.    Acute metabolic encephalopathy As above   Borderline underweight based on BMI: Body mass index is 18.54 kg/m.  Underweight - under 18  overweight - 25 to 29 obese - 30 or more Class 1 obesity: BMI of 30.0 to 34 Class 2 obesity: BMI of 35.0 to 39 Class 3 obesity: BMI of 40.0 to 49 Super Morbid Obesity: BMI 50-59 Super-super Morbid Obesity: BMI 60+ Significantly low or high BMI is associated with higher medical risk.  Weight management advised as adjunct to other disease management and risk reduction treatments    DVT prophylaxis: n/a on comfort measures  IV fluids: no continuous IV fluids  Nutrition: as tolerated / as desired  Central lines / invasive devices: none  Code  Status: DNR/DNI ACP documentation reviewed:  advanced directive on file in [facility]  TOC needs: none at this time Barriers to dispo / significant pending items: comfort measures, anticipate hospital death              Subjective / Brief ROS:  Patient not contributory d/t illness   Family Communication: family at bedside, all questions answered, no needs currently, no concerns for patient currently     Objective Findings:  Vitals:   11/14/23 2107 11/15/23 0854 11/15/23 2013 11/16/23 0731  BP: 93/66 100/62 110/70 121/79  Pulse: 97 97 (!) 115 (!) 124  Resp: (!) 3 (!) 8 (!) 5 (!) 8  Temp: 98.6 F (37 C) 99.5 F (37.5 C) (!) 100.9 F (38.3 C) (!) 103.2 F (39.6 C)  TempSrc: Oral     SpO2: 97% 91% (!) 71% (!) 64%  Weight:      Height:       No intake or output data in the 24 hours ending 11/16/23 1418 Filed Weights   11/14/23 0651  Weight: 62 kg    Examination:  Physical Exam Constitutional:      Appearance: He is ill-appearing and diaphoretic.  Cardiovascular:     Rate and Rhythm: Normal rate.  Pulmonary:     Effort: No respiratory distress.     Comments: Low RR but not quite agonal          Scheduled Medications:    Continuous Infusions:  morphine 2 mg/hr (11/15/23 1524)    PRN Medications:  glycopyrrolate, [DISCONTINUED] haloperidol **OR** haloperidol **OR** haloperidol lactate, [DISCONTINUED] LORazepam **OR** LORazepam **  OR** LORazepam, morphine, [DISCONTINUED] ondansetron **OR** ondansetron (ZOFRAN) IV  Antimicrobials from admission:  Anti-infectives (From admission, onward)    None           Data Reviewed:  I have personally reviewed the following...  CBC: Recent Labs  Lab 11/11/23 1355 11/12/23 0234  WBC 9.6 7.5  NEUTROABS 8.7*  --   HGB 13.8 11.6*  HCT 42.9 35.4*  MCV 99.3 99.4  PLT 226 158   Basic Metabolic Panel: Recent Labs  Lab 11/11/23 1355 11/12/23 0234  NA 140 142  K 3.7 3.9  CL 101 108  CO2 27 27   GLUCOSE 97 109*  BUN 24* 26*  CREATININE 0.83 0.54*  CALCIUM 8.1* 7.7*   GFR: Estimated Creatinine Clearance: 75.3 mL/min (A) (by C-G formula based on SCr of 0.54 mg/dL (L)). Liver Function Tests: Recent Labs  Lab 11/11/23 1355 11/12/23 0234  AST 30 23  ALT 22 20  ALKPHOS 111 76  BILITOT 1.0 0.7  PROT 5.8* 4.4*  ALBUMIN 2.9* 2.1*   No results for input(s): "LIPASE", "AMYLASE" in the last 168 hours. Recent Labs  Lab 11/11/23 1704  AMMONIA 11   Coagulation Profile: Recent Labs  Lab 11/11/23 1355  INR 1.2   Cardiac Enzymes: No results for input(s): "CKTOTAL", "CKMB", "CKMBINDEX", "TROPONINI" in the last 168 hours. BNP (last 3 results) No results for input(s): "PROBNP" in the last 8760 hours. HbA1C: No results for input(s): "HGBA1C" in the last 72 hours. CBG: Recent Labs  Lab 11/11/23 1821 11/11/23 1928 11/11/23 2329 11/12/23 0408 11/12/23 0730  GLUCAP 94 87 87 96 99   Lipid Profile: No results for input(s): "CHOL", "HDL", "LDLCALC", "TRIG", "CHOLHDL", "LDLDIRECT" in the last 72 hours. Thyroid Function Tests: No results for input(s): "TSH", "T4TOTAL", "FREET4", "T3FREE", "THYROIDAB" in the last 72 hours. Anemia Panel: No results for input(s): "VITAMINB12", "FOLATE", "FERRITIN", "TIBC", "IRON", "RETICCTPCT" in the last 72 hours. Most Recent Urinalysis On File:     Component Value Date/Time   COLORURINE YELLOW (A) 09/15/2023 0443   APPEARANCEUR CLOUDY (A) 09/15/2023 0443   LABSPEC 1.020 09/15/2023 0443   PHURINE 8.0 09/15/2023 0443   GLUCOSEU NEGATIVE 09/15/2023 0443   HGBUR NEGATIVE 09/15/2023 0443   BILIRUBINUR NEGATIVE 09/15/2023 0443   KETONESUR 20 (A) 09/15/2023 0443   PROTEINUR NEGATIVE 09/15/2023 0443   NITRITE NEGATIVE 09/15/2023 0443   LEUKOCYTESUR NEGATIVE 09/15/2023 0443   Sepsis Labs: @LABRCNTIP (procalcitonin:4,lacticidven:4) Microbiology: Recent Results (from the past 240 hours)  Resp panel by RT-PCR (RSV, Flu A&B, Covid) Anterior Nasal  Swab     Status: None   Collection Time: 11/11/23  1:55 PM   Specimen: Anterior Nasal Swab  Result Value Ref Range Status   SARS Coronavirus 2 by RT PCR NEGATIVE NEGATIVE Final    Comment: (NOTE) SARS-CoV-2 target nucleic acids are NOT DETECTED.  The SARS-CoV-2 RNA is generally detectable in upper respiratory specimens during the acute phase of infection. The lowest concentration of SARS-CoV-2 viral copies this assay can detect is 138 copies/mL. A negative result does not preclude SARS-Cov-2 infection and should not be used as the sole basis for treatment or other patient management decisions. A negative result may occur with  improper specimen collection/handling, submission of specimen other than nasopharyngeal swab, presence of viral mutation(s) within the areas targeted by this assay, and inadequate number of viral copies(<138 copies/mL). A negative result must be combined with clinical observations, patient history, and epidemiological information. The expected result is Negative.  Fact Sheet for Patients:  BloggerCourse.com  Fact Sheet for Healthcare Providers:  SeriousBroker.it  This test is no t yet approved or cleared by the [place] FDA and  has been authorized for detection and/or diagnosis of SARS-CoV-2 by FDA under an Emergency Use Authorization (EUA). This EUA will remain  in effect (meaning this test can be used) for the duration of the COVID-19 declaration under Section 564(b)(1) of the Act, 21 U.S.C.section 360bbb-3(b)(1), unless the authorization is terminated  or revoked sooner.       Influenza A by PCR NEGATIVE NEGATIVE Final   Influenza B by PCR NEGATIVE NEGATIVE Final    Comment: (NOTE) The Xpert Xpress SARS-CoV-2/FLU/RSV plus assay is intended as an aid in the diagnosis of influenza from Nasopharyngeal swab specimens and should not be used as a sole basis for treatment. Nasal washings and aspirates  are unacceptable for Xpert Xpress SARS-CoV-2/FLU/RSV testing.  Fact Sheet for Patients: BloggerCourse.com  Fact Sheet for Healthcare Providers: SeriousBroker.it  This test is not yet approved or cleared by the [place] FDA and has been authorized for detection and/or diagnosis of SARS-CoV-2 by FDA under an Emergency Use Authorization (EUA). This EUA will remain in effect (meaning this test can be used) for the duration of the COVID-19 declaration under Section 564(b)(1) of the Act, 21 U.S.C. section 360bbb-3(b)(1), unless the authorization is terminated or revoked.     Resp Syncytial Virus by PCR NEGATIVE NEGATIVE Final    Comment: (NOTE) Fact Sheet for Patients: BloggerCourse.com  Fact Sheet for Healthcare Providers: SeriousBroker.it  This test is not yet approved or cleared by the [place] FDA and has been authorized for detection and/or diagnosis of SARS-CoV-2 by FDA under an Emergency Use Authorization (EUA). This EUA will remain in effect (meaning this test can be used) for the duration of the COVID-19 declaration under Section 564(b)(1) of the Act, 21 U.S.C. section 360bbb-3(b)(1), unless the authorization is terminated or revoked.  Performed at [facility], [address], [place], [place] [address]   Blood Culture (routine x 2)     Status: Abnormal   Collection Time: 11/11/23  1:55 PM   Specimen: BLOOD  Result Value Ref Range Status   Specimen Description   Final    BLOOD BLOOD LEFT ARM Performed at [facility], [address] [place], [place] [address]    Special Requests   Final    BOTTLES DRAWN AEROBIC AND ANAEROBIC Blood Culture results may not be optimal due to an inadequate volume of blood received in culture bottles Performed at [facility], [address] [place], [place] [address]    Culture  Setup Time   Final     GRAM POSITIVE COCCI AEROBIC BOTTLE ONLY Organism ID to follow CRITICAL RESULT CALLED TO, READ BACK BY AND VERIFIED WITH:  NATHAN BELUE AT 2334 11/12/23 JG GRAM STAIN REVIEWED-AGREE WITH RESULT    Culture (A)  Final    STAPHYLOCOCCUS EPIDERMIDIS THE SIGNIFICANCE OF ISOLATING THIS ORGANISM FROM A SINGLE SET OF BLOOD CULTURES WHEN MULTIPLE SETS ARE DRAWN IS UNCERTAIN. PLEASE NOTIFY THE MICROBIOLOGY DEPARTMENT WITHIN ONE WEEK IF SPECIATION AND SENSITIVITIES ARE REQUIRED. Performed at [facility], [address] [place], [place] [address]    Report Status 11/14/2023 FINAL  Final  Blood Culture (routine x 2)     Status: None   Collection Time: 11/11/23  1:55 PM   Specimen: BLOOD  Result Value Ref Range Status   Specimen Description BLOOD BLOOD RIGHT ARM  Final  Special Requests   Final    BOTTLES DRAWN AEROBIC AND ANAEROBIC Blood Culture adequate volume   Culture   Final    NO GROWTH 5 DAYS Performed at [facility], [address] [place], [place] [address]    Report Status 11/16/2023 FINAL  Final  Blood Culture ID Panel (Reflexed)     Status: Abnormal   Collection Time: 11/11/23  1:55 PM  Result Value Ref Range Status   Enterococcus faecalis NOT DETECTED NOT DETECTED Final   Enterococcus Faecium NOT DETECTED NOT DETECTED Final   Listeria monocytogenes NOT DETECTED NOT DETECTED Final   Staphylococcus species DETECTED (A) NOT DETECTED Final    Comment: CRITICAL RESULT CALLED TO, READ BACK BY AND VERIFIED WITH:  NATHAN BELUE AT 2334 11/12/23 JG    Staphylococcus aureus (BCID) NOT DETECTED NOT DETECTED Final   Staphylococcus epidermidis DETECTED (A) NOT DETECTED Final    Comment: Methicillin (oxacillin) resistant coagulase negative staphylococcus. Possible blood culture contaminant (unless isolated from more than one blood culture draw or clinical case suggests pathogenicity). No antibiotic treatment is indicated for blood  culture contaminants. CRITICAL RESULT  CALLED TO, READ BACK BY AND VERIFIED WITH:  NATHAN BELUE AT 2334 11/12/23 JG    Staphylococcus lugdunensis NOT DETECTED NOT DETECTED Final   Streptococcus species NOT DETECTED NOT DETECTED Final   Streptococcus agalactiae NOT DETECTED NOT DETECTED Final   Streptococcus pneumoniae NOT DETECTED NOT DETECTED Final   Streptococcus pyogenes NOT DETECTED NOT DETECTED Final   A.calcoaceticus-baumannii NOT DETECTED NOT DETECTED Final   Bacteroides fragilis NOT DETECTED NOT DETECTED Final   Enterobacterales NOT DETECTED NOT DETECTED Final   Enterobacter cloacae complex NOT DETECTED NOT DETECTED Final   Escherichia coli NOT DETECTED NOT DETECTED Final   Klebsiella aerogenes NOT DETECTED NOT DETECTED Final   Klebsiella oxytoca NOT DETECTED NOT DETECTED Final   Klebsiella pneumoniae NOT DETECTED NOT DETECTED Final   Proteus species NOT DETECTED NOT DETECTED Final   Salmonella species NOT DETECTED NOT DETECTED Final   Serratia marcescens NOT DETECTED NOT DETECTED Final   Haemophilus influenzae NOT DETECTED NOT DETECTED Final   Neisseria meningitidis NOT DETECTED NOT DETECTED Final   Pseudomonas aeruginosa NOT DETECTED NOT DETECTED Final   Stenotrophomonas maltophilia NOT DETECTED NOT DETECTED Final   Candida albicans NOT DETECTED NOT DETECTED Final   Candida auris NOT DETECTED NOT DETECTED Final   Candida glabrata NOT DETECTED NOT DETECTED Final   Candida krusei NOT DETECTED NOT DETECTED Final   Candida parapsilosis NOT DETECTED NOT DETECTED Final   Candida tropicalis NOT DETECTED NOT DETECTED Final   Cryptococcus neoformans/gattii NOT DETECTED NOT DETECTED Final   Methicillin resistance mecA/C DETECTED (A) NOT DETECTED Final    Comment: CRITICAL RESULT CALLED TO, READ BACK BY AND VERIFIED WITHDawayne Cirri AT 2334 11/12/23 JG Performed at [facility], [address] [place], [place] [address]   MRSA Next Gen by PCR, Nasal     Status: Abnormal   Collection Time: 11/11/23  6:24 PM    Specimen: Nasal Mucosa; Nasal Swab  Result Value Ref Range Status   MRSA by PCR Next Gen DETECTED (A) NOT DETECTED Final    Comment: CRITICAL RESULT CALLED TO, READ BACK BY AND VERIFIED WITH: MARSHELL RICHARDS RN ICU @ 2045 11/11/2023 LFD (NOTE) The GeneXpert MRSA Assay (FDA approved for NASAL specimens only), is one component of a comprehensive MRSA colonization surveillance program. It is not intended to diagnose MRSA infection nor to guide or monitor  treatment for MRSA infections. Test performance is not FDA approved in patients less than 81 years old. Performed at [facility], [address], [place], [place] [address]       Radiology Studies last 3 days: No results found.        Sunnie Nielsen, DO [facility] 11/16/2023, 2:18 PM    Dictation software may have been used to generate the above note. Typos may occur and escape review in typed/dictated notes. Please contact Dr Lyn Hollingshead directly for clarity if needed.  Staff may message me via secure chat in Epic  but this may not receive an immediate response,  please page me for urgent matters!  If 7PM-7AM, please contact night coverage [email or web address]

## 2023-11-16 NOTE — Progress Notes (Addendum)
[facility] Liaison Note   Will  follow patient peripherally at this time.  Hospital death expected.     Please don't hesitate to call with any Hospice related questions or concerns.    Thank you for the opportunity to participate in this patient's care.   [facility] Liaison [phone]

## 2023-11-16 NOTE — Care Management Important Message (Signed)
Important Message  Patient Details  Name: Patrick Buchanan MRN: [id number] Date of Birth: January 10, 1951   Important Message Given:  Yes - Medicare IM     Cristela Blue, CMA 11/16/2023, 10:22 AM

## 2023-11-16 NOTE — Progress Notes (Signed)
                                                                                                                                                                                                           Daily Progress Note   Patient Name: Patrick Buchanan       Date: 11/16/2023 DOB: 09-01-1951  Age: 73 y.o. MRN#: [id number] Attending Physician: Sunnie Nielsen, DO Primary Care Physician: Mayer Masker, PA-C (Inactive) Admit Date: 11/14/2023  Reason for Consultation/Follow-up: Establishing goals of care  HPI/Brief Hospital Review: 73 y.o. male  with past medical history of multiple brain tumors s/p resection, spastic hemiparesis of left nondominant side, type 2 diabetes, seizure disorder, and encephalopathy admitted on 11/14/2023 with worsening hypoxia.   Patient was discharged yesterday, 1/21, to [facility] with hospice services to follow.  However, as per chart review, patient presented to [facility] without hospice services (provided by [facility] and not an outside agency) in place.  Given hypoxia and no options for comfort care, patient was transferred back to [facility].   PMT was consulted to support patient with comfort measures and ensure plan of care is in line with his goals of care.  Subjective: Extensive chart review has been completed prior to meeting patient including labs, vital signs, imaging, progress notes, orders, and available advanced directive documents from current and previous encounters.    Visited with Patrick Buchanan at his bedside. He is resting in bed comfortably, not alert and unable to acknowledge my presence in room. Daughters and other family visiting at bedside during visit.  Answered and addressed daughters questions related to transition period and EOL expectations.   Review of MAR, remains on continuous Morphine infusion, current regimen maintaining comfort, no recommendations for addition or changing of regimen at this time.  PMT will  continue to follow for ongoing needs and support.  Thank you for allowing the Palliative Medicine Team to assist in the care of this patient.  Total time:  25 minutes  Time spent includes: Detailed review of medical records (labs, imaging, vital signs), medically appropriate exam (mental status, respiratory, cardiac, skin), discussed with treatment team, counseling and educating patient, family and staff, documenting clinical information, medication management and coordination of care.  Leeanne Deed, DNP, AGNP-C Palliative Medicine   Please contact Palliative Medicine Team phone at [phone] for questions and concerns.

## 2023-11-17 DIAGNOSIS — J9601 Acute respiratory failure with hypoxia: Secondary | ICD-10-CM | POA: Diagnosis not present

## 2023-11-17 DIAGNOSIS — Z515 Encounter for palliative care: Secondary | ICD-10-CM | POA: Diagnosis not present

## 2023-11-17 DIAGNOSIS — J189 Pneumonia, unspecified organism: Secondary | ICD-10-CM

## 2023-11-24 NOTE — Progress Notes (Incomplete)
Scripting nurse received report at the beginning of shift on current status of the patient.  The patient remained on comfort care measures.  The patient noted to have 2 daughters present.  The patient remained non responsive, cheyne-stokes  breaths, eyes remained open. Patient continued to receive IV morphine for comfort measures only.  The patient expired/pronounced by assigned nurse @ 2207.  Witnessed by Lupita Leash, RN/charge nurse.  Attending notified of  patient expiring.  Family members remained with patient for until

## 2023-11-24 NOTE — Progress Notes (Signed)
PROGRESS NOTE    Patrick Buchanan   UEA:[id number] DOB: 1951/08/14  DOA: 11/14/2023 Date of Service: 11/02/2023 which is hospital day 3  PCP: Mayer Masker, PA-C (Inactive)    Hospital course / significant events:  Patrick Buchanan is a 73 y.o. 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  Pt is on EOL/hospice care, facility not able to manage symptoms / services not in place. 01/22: pt admitted to hospitalist service on comfort measures. Palliative following.       Consultants:  Palliative care  Procedures/Surgeries: none      ASSESSMENT & PLAN:   Acute hypoxic respiratory failure Secondary to right lower lobe pneumonia DNR/DNI and pursuing comfort care only. morphine drip and Ativan as needed discontinue all p.o. medications to prevent aspiration Expect patient will expire in next few days. Family has requested turning/repositioning only as absolutely necessary, and no suppository medications. OK to continue to assess q2h/prn and adjust meds as ordered, change soiled brief, clean patient as needed.    Acute metabolic encephalopathy As above   Borderline underweight based on BMI: Body mass index is 18.54 kg/m.  Underweight - under 18  overweight - 25 to 29 obese - 30 or more Class 1 obesity: BMI of 30.0 to 34 Class 2 obesity: BMI of 35.0 to 39 Class 3 obesity: BMI of 40.0 to 49 Super Morbid Obesity: BMI 50-59 Super-super Morbid Obesity: BMI 60+ Significantly low or high BMI is associated with higher medical risk.  Weight management advised as adjunct to other disease management and risk reduction treatments    DVT prophylaxis: n/a on comfort measures  IV fluids: no continuous IV fluids  Nutrition: as tolerated / as desired  Central lines / invasive devices: none  Code  Status: DNR/DNI ACP documentation reviewed:  advanced directive on file in [facility]  TOC needs: none at this time Barriers to dispo / significant pending items: comfort measures, anticipate hospital death              Subjective / Brief ROS:  Patient not contributory d/t illness   Family Communication: family at bedside, all questions answered particularly around his breathing / low RR and his pallor, no needs currently, no concerns for patient currently     Objective Findings:  Vitals:   11/16/23 0731 11/16/23 2036 11/05/2023 0356 11/03/2023 0738  BP: 121/79 106/69  106/65  Pulse: (!) 124 (!) 111  (!) 125  Resp: (!) 8 16 (!) 8 (!) 8  Temp: (!) 103.2 F (39.6 C) 98.6 F (37 C)  (!) 100.5 F (38.1 C)  TempSrc:  Oral    SpO2: (!) 64% (!) 55%  (!) 41%  Weight:      Height:       No intake or output data in the 24 hours ending 11/06/2023 1428 Filed Weights   11/14/23 0651  Weight: 62 kg    Examination:  Physical Exam Constitutional:      Appearance: He is ill-appearing and diaphoretic.  Pulmonary:     Effort: No respiratory distress.     Comments: Low RR but not quite agonal  Skin:    Coloration: Skin is pale.          Scheduled Medications:    Continuous Infusions:  morphine 3 mg/hr (11/12/2023 0713)    PRN Medications:  glycopyrrolate, [DISCONTINUED] haloperidol **OR** haloperidol **OR**  haloperidol lactate, [DISCONTINUED] LORazepam **OR** LORazepam **OR** LORazepam, morphine, [DISCONTINUED] ondansetron **OR** ondansetron (ZOFRAN) IV  Antimicrobials from admission:  Anti-infectives (From admission, onward)    None           Data Reviewed:  I have personally reviewed the following...  CBC: Recent Labs  Lab 11/11/23 1355 11/12/23 0234  WBC 9.6 7.5  NEUTROABS 8.7*  --   HGB 13.8 11.6*  HCT 42.9 35.4*  MCV 99.3 99.4  PLT 226 158   Basic Metabolic Panel: Recent Labs  Lab 11/11/23 1355 11/12/23 0234  NA 140 142  K 3.7 3.9  CL  101 108  CO2 27 27  GLUCOSE 97 109*  BUN 24* 26*  CREATININE 0.83 0.54*  CALCIUM 8.1* 7.7*   GFR: Estimated Creatinine Clearance: 75.3 mL/min (A) (by C-G formula based on SCr of 0.54 mg/dL (L)). Liver Function Tests: Recent Labs  Lab 11/11/23 1355 11/12/23 0234  AST 30 23  ALT 22 20  ALKPHOS 111 76  BILITOT 1.0 0.7  PROT 5.8* 4.4*  ALBUMIN 2.9* 2.1*   No results for input(s): "LIPASE", "AMYLASE" in the last 168 hours. Recent Labs  Lab 11/11/23 1704  AMMONIA 11   Coagulation Profile: Recent Labs  Lab 11/11/23 1355  INR 1.2   Cardiac Enzymes: No results for input(s): "CKTOTAL", "CKMB", "CKMBINDEX", "TROPONINI" in the last 168 hours. BNP (last 3 results) No results for input(s): "PROBNP" in the last 8760 hours. HbA1C: No results for input(s): "HGBA1C" in the last 72 hours. CBG: Recent Labs  Lab 11/11/23 1821 11/11/23 1928 11/11/23 2329 11/12/23 0408 11/12/23 0730  GLUCAP 94 87 87 96 99   Lipid Profile: No results for input(s): "CHOL", "HDL", "LDLCALC", "TRIG", "CHOLHDL", "LDLDIRECT" in the last 72 hours. Thyroid Function Tests: No results for input(s): "TSH", "T4TOTAL", "FREET4", "T3FREE", "THYROIDAB" in the last 72 hours. Anemia Panel: No results for input(s): "VITAMINB12", "FOLATE", "FERRITIN", "TIBC", "IRON", "RETICCTPCT" in the last 72 hours. Most Recent Urinalysis On File:     Component Value Date/Time   COLORURINE YELLOW (A) 09/15/2023 0443   APPEARANCEUR CLOUDY (A) 09/15/2023 0443   LABSPEC 1.020 09/15/2023 0443   PHURINE 8.0 09/15/2023 0443   GLUCOSEU NEGATIVE 09/15/2023 0443   HGBUR NEGATIVE 09/15/2023 0443   BILIRUBINUR NEGATIVE 09/15/2023 0443   KETONESUR 20 (A) 09/15/2023 0443   PROTEINUR NEGATIVE 09/15/2023 0443   NITRITE NEGATIVE 09/15/2023 0443   LEUKOCYTESUR NEGATIVE 09/15/2023 0443   Sepsis Labs: @LABRCNTIP (procalcitonin:4,lacticidven:4) Microbiology: Recent Results (from the past 240 hours)  Resp panel by RT-PCR (RSV, Flu A&B,  Covid) Anterior Nasal Swab     Status: None   Collection Time: 11/11/23  1:55 PM   Specimen: Anterior Nasal Swab  Result Value Ref Range Status   SARS Coronavirus 2 by RT PCR NEGATIVE NEGATIVE Final    Comment: (NOTE) SARS-CoV-2 target nucleic acids are NOT DETECTED.  The SARS-CoV-2 RNA is generally detectable in upper respiratory specimens during the acute phase of infection. The lowest concentration of SARS-CoV-2 viral copies this assay can detect is 138 copies/mL. A negative result does not preclude SARS-Cov-2 infection and should not be used as the sole basis for treatment or other patient management decisions. A negative result may occur with  improper specimen collection/handling, submission of specimen other than nasopharyngeal swab, presence of viral mutation(s) within the areas targeted by this assay, and inadequate number of viral copies(<138 copies/mL). A negative result must be combined with clinical observations, patient history, and epidemiological information. The expected result is Negative.  Fact Sheet for Patients:  BloggerCourse.com  Fact Sheet for Healthcare Providers:  SeriousBroker.it  This test is no t yet approved or cleared by the [place] FDA and  has been authorized for detection and/or diagnosis of SARS-CoV-2 by FDA under an Emergency Use Authorization (EUA). This EUA will remain  in effect (meaning this test can be used) for the duration of the COVID-19 declaration under Section 564(b)(1) of the Act, 21 U.S.C.section 360bbb-3(b)(1), unless the authorization is terminated  or revoked sooner.       Influenza A by PCR NEGATIVE NEGATIVE Final   Influenza B by PCR NEGATIVE NEGATIVE Final    Comment: (NOTE) The Xpert Xpress SARS-CoV-2/FLU/RSV plus assay is intended as an aid in the diagnosis of influenza from Nasopharyngeal swab specimens and should not be used as a sole basis for treatment. Nasal  washings and aspirates are unacceptable for Xpert Xpress SARS-CoV-2/FLU/RSV testing.  Fact Sheet for Patients: BloggerCourse.com  Fact Sheet for Healthcare Providers: SeriousBroker.it  This test is not yet approved or cleared by the [place] FDA and has been authorized for detection and/or diagnosis of SARS-CoV-2 by FDA under an Emergency Use Authorization (EUA). This EUA will remain in effect (meaning this test can be used) for the duration of the COVID-19 declaration under Section 564(b)(1) of the Act, 21 U.S.C. section 360bbb-3(b)(1), unless the authorization is terminated or revoked.     Resp Syncytial Virus by PCR NEGATIVE NEGATIVE Final    Comment: (NOTE) Fact Sheet for Patients: BloggerCourse.com  Fact Sheet for Healthcare Providers: SeriousBroker.it  This test is not yet approved or cleared by the [place] FDA and has been authorized for detection and/or diagnosis of SARS-CoV-2 by FDA under an Emergency Use Authorization (EUA). This EUA will remain in effect (meaning this test can be used) for the duration of the COVID-19 declaration under Section 564(b)(1) of the Act, 21 U.S.C. section 360bbb-3(b)(1), unless the authorization is terminated or revoked.  Performed at [facility], [address] [place], [place] [address]   Blood Culture (routine x 2)     Status: Abnormal   Collection Time: 11/11/23  1:55 PM   Specimen: BLOOD  Result Value Ref Range Status   Specimen Description   Final    BLOOD BLOOD LEFT ARM Performed at [facility], [address], [place], [place] [address]    Special Requests   Final    BOTTLES DRAWN AEROBIC AND ANAEROBIC Blood Culture results may not be optimal due to an inadequate volume of blood received in culture bottles Performed at [facility], [address], [place], [place] [address]     Culture  Setup Time   Final    GRAM POSITIVE COCCI AEROBIC BOTTLE ONLY Organism ID to follow CRITICAL RESULT CALLED TO, READ BACK BY AND VERIFIED WITH:  NATHAN BELUE AT 2334 11/12/23 JG GRAM STAIN REVIEWED-AGREE WITH RESULT    Culture (A)  Final    STAPHYLOCOCCUS EPIDERMIDIS THE SIGNIFICANCE OF ISOLATING THIS ORGANISM FROM A SINGLE SET OF BLOOD CULTURES WHEN MULTIPLE SETS ARE DRAWN IS UNCERTAIN. PLEASE NOTIFY THE MICROBIOLOGY DEPARTMENT WITHIN ONE WEEK IF SPECIATION AND SENSITIVITIES ARE REQUIRED. Performed at [facility], [address] [place], [place] [address]    Report Status 11/14/2023 FINAL  Final  Blood Culture (routine x 2)     Status: None   Collection Time: 11/11/23  1:55 PM   Specimen: BLOOD  Result Value Ref Range Status   Specimen Description BLOOD BLOOD  RIGHT ARM  Final   Special Requests   Final    BOTTLES DRAWN AEROBIC AND ANAEROBIC Blood Culture adequate volume   Culture   Final    NO GROWTH 5 DAYS Performed at [facility], [address] [place], [place] [address]    Report Status 11/16/2023 FINAL  Final  Blood Culture ID Panel (Reflexed)     Status: Abnormal   Collection Time: 11/11/23  1:55 PM  Result Value Ref Range Status   Enterococcus faecalis NOT DETECTED NOT DETECTED Final   Enterococcus Faecium NOT DETECTED NOT DETECTED Final   Listeria monocytogenes NOT DETECTED NOT DETECTED Final   Staphylococcus species DETECTED (A) NOT DETECTED Final    Comment: CRITICAL RESULT CALLED TO, READ BACK BY AND VERIFIED WITH:  NATHAN BELUE AT 2334 11/12/23 JG    Staphylococcus aureus (BCID) NOT DETECTED NOT DETECTED Final   Staphylococcus epidermidis DETECTED (A) NOT DETECTED Final    Comment: Methicillin (oxacillin) resistant coagulase negative staphylococcus. Possible blood culture contaminant (unless isolated from more than one blood culture draw or clinical case suggests pathogenicity). No antibiotic treatment is indicated for blood  culture  contaminants. CRITICAL RESULT CALLED TO, READ BACK BY AND VERIFIED WITH:  NATHAN BELUE AT 2334 11/12/23 JG    Staphylococcus lugdunensis NOT DETECTED NOT DETECTED Final   Streptococcus species NOT DETECTED NOT DETECTED Final   Streptococcus agalactiae NOT DETECTED NOT DETECTED Final   Streptococcus pneumoniae NOT DETECTED NOT DETECTED Final   Streptococcus pyogenes NOT DETECTED NOT DETECTED Final   A.calcoaceticus-baumannii NOT DETECTED NOT DETECTED Final   Bacteroides fragilis NOT DETECTED NOT DETECTED Final   Enterobacterales NOT DETECTED NOT DETECTED Final   Enterobacter cloacae complex NOT DETECTED NOT DETECTED Final   Escherichia coli NOT DETECTED NOT DETECTED Final   Klebsiella aerogenes NOT DETECTED NOT DETECTED Final   Klebsiella oxytoca NOT DETECTED NOT DETECTED Final   Klebsiella pneumoniae NOT DETECTED NOT DETECTED Final   Proteus species NOT DETECTED NOT DETECTED Final   Salmonella species NOT DETECTED NOT DETECTED Final   Serratia marcescens NOT DETECTED NOT DETECTED Final   Haemophilus influenzae NOT DETECTED NOT DETECTED Final   Neisseria meningitidis NOT DETECTED NOT DETECTED Final   Pseudomonas aeruginosa NOT DETECTED NOT DETECTED Final   Stenotrophomonas maltophilia NOT DETECTED NOT DETECTED Final   Candida albicans NOT DETECTED NOT DETECTED Final   Candida auris NOT DETECTED NOT DETECTED Final   Candida glabrata NOT DETECTED NOT DETECTED Final   Candida krusei NOT DETECTED NOT DETECTED Final   Candida parapsilosis NOT DETECTED NOT DETECTED Final   Candida tropicalis NOT DETECTED NOT DETECTED Final   Cryptococcus neoformans/gattii NOT DETECTED NOT DETECTED Final   Methicillin resistance mecA/C DETECTED (A) NOT DETECTED Final    Comment: CRITICAL RESULT CALLED TO, READ BACK BY AND VERIFIED WITHDawayne Cirri AT 2334 11/12/23 JG Performed at [facility], [address] [place], [place] [address]   MRSA Next Gen by PCR, Nasal     Status: Abnormal    Collection Time: 11/11/23  6:24 PM   Specimen: Nasal Mucosa; Nasal Swab  Result Value Ref Range Status   MRSA by PCR Next Gen DETECTED (A) NOT DETECTED Final    Comment: CRITICAL RESULT CALLED TO, READ BACK BY AND VERIFIED WITH: MARSHELL RICHARDS RN ICU @ 2045 11/11/2023 LFD (NOTE) The GeneXpert MRSA Assay (FDA approved for NASAL specimens only), is one component of a comprehensive MRSA colonization surveillance program. It is not intended to diagnose MRSA  infection nor to guide or monitor treatment for MRSA infections. Test performance is not FDA approved in patients less than 86 years old. Performed at [facility], [address] [place], [place] [address]       Radiology Studies last 3 days: No results found.        Sunnie Nielsen, DO [facility] 10/26/2023, 2:28 PM    Dictation software may have been used to generate the above note. Typos may occur and escape review in typed/dictated notes. Please contact Dr Lyn Hollingshead directly for clarity if needed.  Staff may message me via secure chat in Epic  but this may not receive an immediate response,  please page me for urgent matters!  If 7PM-7AM, please contact night coverage [email or web address]

## 2023-11-24 NOTE — Progress Notes (Signed)
Daily Progress Note   Patient Name: Patrick Buchanan       Date: 11/16/2023 DOB: Oct 18, 1951  Age: 73 y.o. MRN#: [id number] Attending Physician: Sunnie Nielsen, DO Primary Care Physician: Mayer Masker, PA-C (Inactive) Admit Date: 11/14/2023  Reason for Consultation/Follow-up: Establishing goals of care  HPI/Brief Hospital Review: 73 y.o. male  with past medical history of multiple brain tumors s/p resection, spastic hemiparesis of left nondominant side, type 2 diabetes, seizure disorder, and encephalopathy admitted on 11/14/2023 with worsening hypoxia.   Patient was discharged yesterday, 1/21, to [facility] with hospice services to follow.  However, as per chart review, patient presented to [facility] without hospice services (provided by [facility] and not an outside agency) in place.  Given hypoxia and no options for comfort care, patient was transferred back to [facility].   PMT was consulted to support patient with comfort measures and ensure plan of care is in line with his goals of care.  Subjective: Extensive chart review has been completed prior to meeting patient including labs, vital signs, imaging, progress notes, orders, and available advanced directive documents from current and previous encounters.    Visited with Patrick Buchanan at his bedside. He is resting in bed, eyes open but does not respond to calling of his name and unable to follow commands. Daughter at bedside during time of visit.  Answered and addressed daughters questions related to transition period and EOL expectations.   We also discussed option of transferring to hospice home. Due to possible transition period due to cool extremities and prolonged periods of apnea, daughter prefers hospital passing  due to the high risk of passing during transport.  Remains on continuous morphine infusion, review of MAR, current regimen remains sufficient in maintaining comfort, no recommendations for medication adjustments or changes at this time.  PMT will continue to follow for ongoing needs and support.  Thank you for allowing the Palliative Medicine Team to assist in the care of this patient.  Total time:  25 minutes  Time spent includes: Detailed review of medical records (labs, imaging, vital signs), medically appropriate exam (mental status, respiratory, cardiac, skin), discussed with treatment team, counseling and educating patient, family and staff, documenting clinical information, medication management and coordination of care.  Leeanne Deed, DNP, AGNP-C Palliative Medicine   Please contact Palliative Medicine  Team phone at [phone] for questions and concerns.

## 2023-11-24 NOTE — Death Summary Note (Signed)
DEATH SUMMARY   Patient Details  Name: Patrick Buchanan MRN: [id number] DOB: 13-Jul-1951 OZD:[id number], Kandis Cocking, PA-C (Inactive) Admission/Discharge Information   Admit Date:  12-09-2023  Date of Death:  12/12/23  Time of Death:  20:07  Length of Stay: 3   Principle Cause of death: septic shock secondary to pneumonia   Hospital Diagnoses: Principal Problem:   PNA (pneumonia) Active Problems:   Acute respiratory failure with hypoxia (HCC)   End of life care   Comfort measures only status   Palliative care by specialist    Hospital course / significant events:  Patrick Buchanan is a 73 y.o. male with medical history significant of multiple brain tumors status post resection, spastic hemiparesis on left nondominant side, DM2, seizure disorder, sent from nursing home for evaluation of worsening respiratory status.  Pt is on EOL/hospice care, facility not able to manage symptoms / services not in place. 09-Dec-2023: pt admitted to hospitalist service on comfort measures. Palliative following.    Consultants:  Palliative care  Procedures/Surgeries: none      ASSESSMENT & PLAN:   Acute hypoxic respiratory failure Secondary to right lower lobe pneumonia DNR/DNI and pursuing comfort care only. morphine drip and Ativan as needed discontinue all p.o. medications to prevent aspiration Expect patient will expire in next few days. Family has requested turning/repositioning only as absolutely necessary, and no suppository medications. OK to continue to assess q2h/prn and adjust meds as ordered, change soiled brief, clean patient as needed.    Acute metabolic encephalopathy As above   Borderline underweight based on BMI: Body mass index is 18.54 kg/m.  Underweight - under 18  overweight - 25 to 29 obese - 30 or more Class 1 obesity: BMI of 30.0 to 34 Class 2 obesity: BMI of 35.0 to 39 Class 3 obesity: BMI of 40.0 to 49 Super Morbid Obesity: BMI 50-59 Super-super Morbid  Obesity: BMI 60+          Active Pressure Injury/Wound(s)     Pressure Ulcer  Duration          Pressure Injury 11/11/23 Sacrum Left Deep Tissue Pressure Injury - Purple or maroon localized area of discolored intact skin or blood-filled blister due to damage of underlying soft tissue from pressure and/or shear. 7 days           (Optional):[id number]}   Procedures: none  Consultations: none  The results of significant diagnostics from this hospitalization (including imaging, microbiology, ancillary and laboratory) are listed below for reference.   Significant Diagnostic Studies: CT CHEST WO CONTRAST Result Date: 11/11/2023 CLINICAL DATA:  Advanced dementia presenting with shortness of breath, fever and sepsis. Evaluate for respiratory illness. EXAM: CT CHEST WITHOUT CONTRAST TECHNIQUE: Multidetector CT imaging of the chest was performed following the standard protocol without IV contrast. RADIATION DOSE REDUCTION: This exam was performed according to the departmental dose-optimization program which includes automated exposure control, adjustment of the mA and/or kV according to patient size and/or use of iterative reconstruction technique. COMPARISON:  None Available. FINDINGS: Cardiovascular: Heart size is within normal limits. Aortic atherosclerosis. Coronary artery calcifications. No pericardial effusion. Mediastinum/Nodes: Thyroid gland, trachea, and esophagus appear normal. No enlarged mediastinal, axillary or supraclavicular lymph nodes. Hilar lymph nodes are suboptimally evaluated due to lack of IV contrast material. Lungs/Pleura: Asymmetric elevation of the right hemidiaphragm. No pleural effusion. Diffuse bronchial wall thickening. Scattered tree-in-bud nodularity identified within both lungs along with patchy areas of ground-glass attenuation. Interlobular thickening noted within the right middle lobe. A  few scattered nonspecific lung nodules are noted bilaterally. This includes  right upper lobe lung nodule measuring 4 mm, image 59/2. Right middle lobe lung nodule measures 5 mm, image 101/2. Dense airspace consolidation involving nearly the entire right lower lobe is identified with sparing of the superior segment. Some scattered air bronchograms identified within this area. Upper Abdomen: No acute abnormality. Multiple gallstones identified measuring up to at least 1.5 cm, image 163/2. Musculoskeletal: No chest wall mass or suspicious bone lesions identified. IMPRESSION: 1. Dense airspace consolidation involving nearly the entire right lower lobe with sparing of the superior segment. Findings are compatible with pneumonia. This will require follow-up imaging to ensure complete resolution and to rule out underlying malignancy. 2. Scattered tree-in-bud nodularity identified within both lungs along with patchy areas of ground-glass attenuation. Findings are favored to represent sequelae of infectious bronchiolitis. 3. A few scattered nonspecific lung nodules are noted bilaterally. These measure up to 5 mm and although these are likely postinflammatory/infectious follow-up imaging is advised to ensure resolution. 4. Coronary artery calcifications. 5. Cholelithiasis. 6.  Aortic Atherosclerosis (ICD10-I70.0). Electronically Signed   By: Signa Kell M.D.   On: 11/11/2023 16:25   DG Chest Port 1 View Result Date: 11/11/2023 CLINICAL DATA:  Possible sepsis.  Respiratory distress. EXAM: PORTABLE CHEST 1 VIEW COMPARISON:  09/28/2023 FINDINGS: Patient is moderately rotated to the right. Mild prominence of the right perihilar vessels which may be partly positional in nature versus asymmetric vascular congestion. Left lung is clear. No lobar consolidation or effusion. Cardiomediastinal silhouette and remainder of the exam is unchanged. IMPRESSION: Mild prominence of the right perihilar vessels which may be partly positional in nature versus asymmetric vascular congestion. Electronically Signed    By: Elberta Fortis M.D.   On: 11/11/2023 14:34    Microbiology: Recent Results (from the past 240 hours)  Resp panel by RT-PCR (RSV, Flu A&B, Covid) Anterior Nasal Swab     Status: None   Collection Time: 11/11/23  1:55 PM   Specimen: Anterior Nasal Swab  Result Value Ref Range Status   SARS Coronavirus 2 by RT PCR NEGATIVE NEGATIVE Final    Comment: (NOTE) SARS-CoV-2 target nucleic acids are NOT DETECTED.  The SARS-CoV-2 RNA is generally detectable in upper respiratory specimens during the acute phase of infection. The lowest concentration of SARS-CoV-2 viral copies this assay can detect is 138 copies/mL. A negative result does not preclude SARS-Cov-2 infection and should not be used as the sole basis for treatment or other patient management decisions. A negative result may occur with  improper specimen collection/handling, submission of specimen other than nasopharyngeal swab, presence of viral mutation(s) within the areas targeted by this assay, and inadequate number of viral copies(<138 copies/mL). A negative result must be combined with clinical observations, patient history, and epidemiological information. The expected result is Negative.  Fact Sheet for Patients:  BloggerCourse.com  Fact Sheet for Healthcare Providers:  SeriousBroker.it  This test is no t yet approved or cleared by the [place] FDA and  has been authorized for detection and/or diagnosis of SARS-CoV-2 by FDA under an Emergency Use Authorization (EUA). This EUA will remain  in effect (meaning this test can be used) for the duration of the COVID-19 declaration under Section 564(b)(1) of the Act, 21 U.S.C.section 360bbb-3(b)(1), unless the authorization is terminated  or revoked sooner.       Influenza A by PCR NEGATIVE NEGATIVE Final   Influenza B by PCR NEGATIVE NEGATIVE Final    Comment: (NOTE) The  Xpert Xpress SARS-CoV-2/FLU/RSV plus assay is  intended as an aid in the diagnosis of influenza from Nasopharyngeal swab specimens and should not be used as a sole basis for treatment. Nasal washings and aspirates are unacceptable for Xpert Xpress SARS-CoV-2/FLU/RSV testing.  Fact Sheet for Patients: BloggerCourse.com  Fact Sheet for Healthcare Providers: SeriousBroker.it  This test is not yet approved or cleared by the [place] FDA and has been authorized for detection and/or diagnosis of SARS-CoV-2 by FDA under an Emergency Use Authorization (EUA). This EUA will remain in effect (meaning this test can be used) for the duration of the COVID-19 declaration under Section 564(b)(1) of the Act, 21 U.S.C. section 360bbb-3(b)(1), unless the authorization is terminated or revoked.     Resp Syncytial Virus by PCR NEGATIVE NEGATIVE Final    Comment: (NOTE) Fact Sheet for Patients: BloggerCourse.com  Fact Sheet for Healthcare Providers: SeriousBroker.it  This test is not yet approved or cleared by the [place] FDA and has been authorized for detection and/or diagnosis of SARS-CoV-2 by FDA under an Emergency Use Authorization (EUA). This EUA will remain in effect (meaning this test can be used) for the duration of the COVID-19 declaration under Section 564(b)(1) of the Act, 21 U.S.C. section 360bbb-3(b)(1), unless the authorization is terminated or revoked.  Performed at [facility], [address] [place], [place] [address]   Blood Culture (routine x 2)     Status: Abnormal   Collection Time: 11/11/23  1:55 PM   Specimen: BLOOD  Result Value Ref Range Status   Specimen Description   Final    BLOOD BLOOD LEFT ARM Performed at [facility], [address] [place], [place] [address]    Special Requests   Final    BOTTLES DRAWN AEROBIC AND ANAEROBIC Blood Culture results may not be optimal due to  an inadequate volume of blood received in culture bottles Performed at [facility], [address] [place], [place] [address]    Culture  Setup Time   Final    GRAM POSITIVE COCCI AEROBIC BOTTLE ONLY Organism ID to follow CRITICAL RESULT CALLED TO, READ BACK BY AND VERIFIED WITH:  NATHAN BELUE AT 2334 11/12/23 JG GRAM STAIN REVIEWED-AGREE WITH RESULT    Culture (A)  Final    STAPHYLOCOCCUS EPIDERMIDIS THE SIGNIFICANCE OF ISOLATING THIS ORGANISM FROM A SINGLE SET OF BLOOD CULTURES WHEN MULTIPLE SETS ARE DRAWN IS UNCERTAIN. PLEASE NOTIFY THE MICROBIOLOGY DEPARTMENT WITHIN ONE WEEK IF SPECIATION AND SENSITIVITIES ARE REQUIRED. Performed at [facility], [address] [place], [place] [address]    Report Status 11/14/2023 FINAL  Final  Blood Culture (routine x 2)     Status: None   Collection Time: 11/11/23  1:55 PM   Specimen: BLOOD  Result Value Ref Range Status   Specimen Description BLOOD BLOOD RIGHT ARM  Final   Special Requests   Final    BOTTLES DRAWN AEROBIC AND ANAEROBIC Blood Culture adequate volume   Culture   Final    NO GROWTH 5 DAYS Performed at [facility], [address] [place], [place] [address]    Report Status 11/16/2023 FINAL  Final  Blood Culture ID Panel (Reflexed)     Status: Abnormal   Collection Time: 11/11/23  1:55 PM  Result Value Ref Range Status   Enterococcus faecalis NOT DETECTED NOT DETECTED Final   Enterococcus Faecium NOT DETECTED NOT DETECTED Final   Listeria monocytogenes NOT DETECTED NOT DETECTED Final   Staphylococcus species  DETECTED (A) NOT DETECTED Final    Comment: CRITICAL RESULT CALLED TO, READ BACK BY AND VERIFIED WITH:  NATHAN BELUE AT 2334 11/12/23 JG    Staphylococcus aureus (BCID) NOT DETECTED NOT DETECTED Final   Staphylococcus epidermidis DETECTED (A) NOT DETECTED Final    Comment: Methicillin (oxacillin) resistant coagulase negative staphylococcus. Possible blood culture contaminant (unless  isolated from more than one blood culture draw or clinical case suggests pathogenicity). No antibiotic treatment is indicated for blood  culture contaminants. CRITICAL RESULT CALLED TO, READ BACK BY AND VERIFIED WITH:  NATHAN BELUE AT 2334 11/12/23 JG    Staphylococcus lugdunensis NOT DETECTED NOT DETECTED Final   Streptococcus species NOT DETECTED NOT DETECTED Final   Streptococcus agalactiae NOT DETECTED NOT DETECTED Final   Streptococcus pneumoniae NOT DETECTED NOT DETECTED Final   Streptococcus pyogenes NOT DETECTED NOT DETECTED Final   A.calcoaceticus-baumannii NOT DETECTED NOT DETECTED Final   Bacteroides fragilis NOT DETECTED NOT DETECTED Final   Enterobacterales NOT DETECTED NOT DETECTED Final   Enterobacter cloacae complex NOT DETECTED NOT DETECTED Final   Escherichia coli NOT DETECTED NOT DETECTED Final   Klebsiella aerogenes NOT DETECTED NOT DETECTED Final   Klebsiella oxytoca NOT DETECTED NOT DETECTED Final   Klebsiella pneumoniae NOT DETECTED NOT DETECTED Final   Proteus species NOT DETECTED NOT DETECTED Final   Salmonella species NOT DETECTED NOT DETECTED Final   Serratia marcescens NOT DETECTED NOT DETECTED Final   Haemophilus influenzae NOT DETECTED NOT DETECTED Final   Neisseria meningitidis NOT DETECTED NOT DETECTED Final   Pseudomonas aeruginosa NOT DETECTED NOT DETECTED Final   Stenotrophomonas maltophilia NOT DETECTED NOT DETECTED Final   Candida albicans NOT DETECTED NOT DETECTED Final   Candida auris NOT DETECTED NOT DETECTED Final   Candida glabrata NOT DETECTED NOT DETECTED Final   Candida krusei NOT DETECTED NOT DETECTED Final   Candida parapsilosis NOT DETECTED NOT DETECTED Final   Candida tropicalis NOT DETECTED NOT DETECTED Final   Cryptococcus neoformans/gattii NOT DETECTED NOT DETECTED Final   Methicillin resistance mecA/C DETECTED (A) NOT DETECTED Final    Comment: CRITICAL RESULT CALLED TO, READ BACK BY AND VERIFIED WITHDawayne Cirri AT 2334 11/12/23  JG Performed at [facility], [address] [place], [place] [address]   MRSA Next Gen by PCR, Nasal     Status: Abnormal   Collection Time: 11/11/23  6:24 PM   Specimen: Nasal Mucosa; Nasal Swab  Result Value Ref Range Status   MRSA by PCR Next Gen DETECTED (A) NOT DETECTED Final    Comment: CRITICAL RESULT CALLED TO, READ BACK BY AND VERIFIED WITH: MARSHELL RICHARDS RN ICU @ 2045 11/11/2023 LFD (NOTE) The GeneXpert MRSA Assay (FDA approved for NASAL specimens only), is one component of a comprehensive MRSA colonization surveillance program. It is not intended to diagnose MRSA infection nor to guide or monitor treatment for MRSA infections. Test performance is not FDA approved in patients less than 86 years old. Performed at [facility], [address] [place], [place] [address]     Time spent: 10 minutes  Signed: Sunnie Nielsen, DO Electronically signed 11/20/23 7:10 PM

## 2023-11-24 NOTE — Progress Notes (Signed)
Scripting nurse received report at the beginning of shift on current status of the patient.  The patient remained on comfort care measures.  The patient noted to have 2 daughters present.  The patient remained non responsive, cheyne-stokes  breaths, eyes remained open. Patient continued to receive IV morphine for comfort measures only.  The patient expired/pronounced by assigned nurse @ 2207.  Witnessed by Lupita Leash, RN/charge nurse.  [facility] called to report patient expired status.  Attending notified of  patient expiring.  Family members notified additional family members at this time.  Family members (daughters) have left the hospital, prior to hospital staff performing post mortem care.

## 2023-11-24 DEATH — deceased

## 2023-12-12 ENCOUNTER — Ambulatory Visit: Payer: Medicare HMO | Admitting: Neurology
